# Patient Record
Sex: Male | Born: 1945 | Race: White | Hispanic: No | Marital: Married | Smoking: Former smoker
Health system: Southern US, Community
[De-identification: ages and names within clinical notes are randomized; demographics above are authoritative.]

## PROBLEM LIST (undated history)

## (undated) DIAGNOSIS — E538 Deficiency of other specified B group vitamins: Secondary | ICD-10-CM

## (undated) DIAGNOSIS — K5730 Diverticulosis of large intestine without perforation or abscess without bleeding: Secondary | ICD-10-CM

## (undated) DIAGNOSIS — I482 Chronic atrial fibrillation, unspecified: Secondary | ICD-10-CM

## (undated) DIAGNOSIS — K5150 Left sided colitis without complications: Secondary | ICD-10-CM

## (undated) DIAGNOSIS — I2510 Atherosclerotic heart disease of native coronary artery without angina pectoris: Secondary | ICD-10-CM

## (undated) DIAGNOSIS — I639 Cerebral infarction, unspecified: Secondary | ICD-10-CM

## (undated) DIAGNOSIS — E785 Hyperlipidemia, unspecified: Secondary | ICD-10-CM

## (undated) DIAGNOSIS — I252 Old myocardial infarction: Secondary | ICD-10-CM

## (undated) DIAGNOSIS — E669 Obesity, unspecified: Secondary | ICD-10-CM

## (undated) DIAGNOSIS — E039 Hypothyroidism, unspecified: Secondary | ICD-10-CM

## (undated) DIAGNOSIS — A0472 Enterocolitis due to Clostridium difficile, not specified as recurrent: Secondary | ICD-10-CM

## (undated) DIAGNOSIS — I255 Ischemic cardiomyopathy: Secondary | ICD-10-CM

## (undated) DIAGNOSIS — G4730 Sleep apnea, unspecified: Secondary | ICD-10-CM

## (undated) DIAGNOSIS — I4891 Unspecified atrial fibrillation: Secondary | ICD-10-CM

## (undated) DIAGNOSIS — I10 Essential (primary) hypertension: Secondary | ICD-10-CM

## (undated) DIAGNOSIS — E119 Type 2 diabetes mellitus without complications: Secondary | ICD-10-CM

## (undated) HISTORY — DX: Cerebral infarction, unspecified: I63.9

## (undated) HISTORY — DX: Unspecified atrial fibrillation: I48.91

## (undated) HISTORY — DX: Diverticulosis of large intestine without perforation or abscess without bleeding: K57.30

## (undated) HISTORY — DX: Deficiency of other specified B group vitamins: E53.8

## (undated) HISTORY — DX: Old myocardial infarction: I25.2

## (undated) HISTORY — DX: Chronic atrial fibrillation, unspecified: I48.20

## (undated) HISTORY — DX: Sleep apnea, unspecified: G47.30

## (undated) HISTORY — DX: Enterocolitis due to Clostridium difficile, not specified as recurrent: A04.72

## (undated) HISTORY — DX: Hypothyroidism, unspecified: E03.9

## (undated) HISTORY — PX: CATARACT EXTRACTION: SUR2

## (undated) HISTORY — DX: Atherosclerotic heart disease of native coronary artery without angina pectoris: I25.10

## (undated) HISTORY — PX: TONSILLECTOMY: SUR1361

## (undated) HISTORY — DX: Essential (primary) hypertension: I10

## (undated) HISTORY — DX: Type 2 diabetes mellitus without complications: E11.9

## (undated) HISTORY — DX: Left sided colitis without complications: K51.50

## (undated) HISTORY — DX: Obesity, unspecified: E66.9

## (undated) HISTORY — DX: Ischemic cardiomyopathy: I25.5

## (undated) HISTORY — DX: Hyperlipidemia, unspecified: E78.5

---

## 1998-05-25 ENCOUNTER — Other Ambulatory Visit: Admission: RE | Admit: 1998-05-25 | Discharge: 1998-05-25 | Payer: Self-pay | Admitting: Gastroenterology

## 1999-09-09 ENCOUNTER — Ambulatory Visit: Admission: RE | Admit: 1999-09-09 | Discharge: 1999-09-09 | Payer: Self-pay | Admitting: Internal Medicine

## 2000-09-11 ENCOUNTER — Encounter (INDEPENDENT_AMBULATORY_CARE_PROVIDER_SITE_OTHER): Payer: Self-pay | Admitting: Specialist

## 2000-09-11 ENCOUNTER — Other Ambulatory Visit: Admission: RE | Admit: 2000-09-11 | Discharge: 2000-09-11 | Payer: Self-pay | Admitting: Gastroenterology

## 2002-02-17 ENCOUNTER — Emergency Department (HOSPITAL_COMMUNITY): Admission: EM | Admit: 2002-02-17 | Discharge: 2002-02-17 | Payer: Self-pay | Admitting: Emergency Medicine

## 2002-04-17 ENCOUNTER — Encounter: Admission: RE | Admit: 2002-04-17 | Discharge: 2002-07-16 | Payer: Self-pay | Admitting: Internal Medicine

## 2002-09-09 ENCOUNTER — Encounter: Admission: RE | Admit: 2002-09-09 | Discharge: 2002-12-08 | Payer: Self-pay | Admitting: Internal Medicine

## 2002-10-10 HISTORY — PX: CARDIAC CATHETERIZATION: SHX172

## 2002-11-14 ENCOUNTER — Encounter: Payer: Self-pay | Admitting: Cardiology

## 2002-11-14 ENCOUNTER — Ambulatory Visit (HOSPITAL_COMMUNITY): Admission: RE | Admit: 2002-11-14 | Discharge: 2002-11-15 | Payer: Self-pay | Admitting: Cardiology

## 2002-12-10 ENCOUNTER — Encounter: Admission: RE | Admit: 2002-12-10 | Discharge: 2003-03-10 | Payer: Self-pay | Admitting: Internal Medicine

## 2003-04-08 ENCOUNTER — Encounter: Admission: RE | Admit: 2003-04-08 | Discharge: 2003-07-07 | Payer: Self-pay | Admitting: Internal Medicine

## 2003-05-06 ENCOUNTER — Observation Stay (HOSPITAL_COMMUNITY): Admission: EM | Admit: 2003-05-06 | Discharge: 2003-05-07 | Payer: Self-pay | Admitting: Emergency Medicine

## 2003-05-06 ENCOUNTER — Encounter: Payer: Self-pay | Admitting: Emergency Medicine

## 2003-05-07 ENCOUNTER — Encounter: Payer: Self-pay | Admitting: Internal Medicine

## 2003-07-08 ENCOUNTER — Encounter: Payer: Self-pay | Admitting: Internal Medicine

## 2003-08-06 ENCOUNTER — Inpatient Hospital Stay (HOSPITAL_COMMUNITY): Admission: AD | Admit: 2003-08-06 | Discharge: 2003-08-10 | Payer: Self-pay | Admitting: Internal Medicine

## 2003-08-12 ENCOUNTER — Encounter: Admission: RE | Admit: 2003-08-12 | Discharge: 2003-11-10 | Payer: Self-pay | Admitting: Internal Medicine

## 2003-12-24 ENCOUNTER — Encounter: Admission: RE | Admit: 2003-12-24 | Discharge: 2004-03-23 | Payer: Self-pay | Admitting: Internal Medicine

## 2005-03-01 ENCOUNTER — Ambulatory Visit: Payer: Self-pay | Admitting: Gastroenterology

## 2005-05-31 ENCOUNTER — Ambulatory Visit: Payer: Self-pay | Admitting: Gastroenterology

## 2005-06-15 ENCOUNTER — Encounter (INDEPENDENT_AMBULATORY_CARE_PROVIDER_SITE_OTHER): Payer: Self-pay | Admitting: Specialist

## 2005-06-15 ENCOUNTER — Ambulatory Visit: Payer: Self-pay | Admitting: Gastroenterology

## 2005-06-22 ENCOUNTER — Ambulatory Visit: Payer: Self-pay | Admitting: Internal Medicine

## 2005-07-13 ENCOUNTER — Encounter: Admission: RE | Admit: 2005-07-13 | Discharge: 2005-10-11 | Payer: Self-pay | Admitting: Internal Medicine

## 2005-08-22 ENCOUNTER — Ambulatory Visit: Payer: Self-pay | Admitting: Internal Medicine

## 2005-08-25 ENCOUNTER — Ambulatory Visit: Payer: Self-pay | Admitting: Internal Medicine

## 2005-12-02 ENCOUNTER — Ambulatory Visit: Payer: Self-pay | Admitting: Internal Medicine

## 2005-12-28 ENCOUNTER — Ambulatory Visit: Payer: Self-pay | Admitting: Internal Medicine

## 2006-10-10 HISTORY — PX: CORONARY STENT PLACEMENT: SHX1402

## 2006-11-07 ENCOUNTER — Ambulatory Visit: Payer: Self-pay | Admitting: Cardiology

## 2006-11-07 ENCOUNTER — Inpatient Hospital Stay (HOSPITAL_COMMUNITY): Admission: EM | Admit: 2006-11-07 | Discharge: 2006-11-10 | Payer: Self-pay | Admitting: Cardiovascular Disease

## 2006-11-10 ENCOUNTER — Ambulatory Visit: Payer: Self-pay | Admitting: Internal Medicine

## 2006-11-16 ENCOUNTER — Ambulatory Visit: Payer: Self-pay | Admitting: Internal Medicine

## 2006-11-16 LAB — CONVERTED CEMR LAB
BUN: 5 mg/dL — ABNORMAL LOW (ref 6–23)
CO2: 30 meq/L (ref 19–32)
Calcium: 8.9 mg/dL (ref 8.4–10.5)
Chloride: 106 meq/L (ref 96–112)
Creatinine, Ser: 0.9 mg/dL (ref 0.4–1.5)
GFR calc Af Amer: 111 mL/min
GFR calc non Af Amer: 91 mL/min
Glucose, Bld: 101 mg/dL — ABNORMAL HIGH (ref 70–99)
Potassium: 4.3 meq/L (ref 3.5–5.1)
Sodium: 142 meq/L (ref 135–145)

## 2006-11-30 ENCOUNTER — Ambulatory Visit: Payer: Self-pay | Admitting: Gastroenterology

## 2006-11-30 ENCOUNTER — Encounter: Admission: RE | Admit: 2006-11-30 | Discharge: 2007-02-28 | Payer: Self-pay | Admitting: Internal Medicine

## 2006-11-30 ENCOUNTER — Ambulatory Visit: Payer: Self-pay | Admitting: Cardiology

## 2007-01-12 ENCOUNTER — Ambulatory Visit: Payer: Self-pay | Admitting: Internal Medicine

## 2007-03-08 ENCOUNTER — Ambulatory Visit: Payer: Self-pay | Admitting: Cardiology

## 2007-04-11 ENCOUNTER — Ambulatory Visit: Payer: Self-pay | Admitting: Internal Medicine

## 2007-04-11 LAB — CONVERTED CEMR LAB
ALT: 15 units/L (ref 0–53)
AST: 22 units/L (ref 0–37)
Albumin: 3.9 g/dL (ref 3.5–5.2)
Alkaline Phosphatase: 99 units/L (ref 39–117)
BUN: 13 mg/dL (ref 6–23)
Bilirubin, Direct: 0.1 mg/dL (ref 0.0–0.3)
CO2: 34 meq/L — ABNORMAL HIGH (ref 19–32)
Calcium: 9.1 mg/dL (ref 8.4–10.5)
Chloride: 108 meq/L (ref 96–112)
Cholesterol: 78 mg/dL (ref 0–200)
Creatinine, Ser: 1 mg/dL (ref 0.4–1.5)
GFR calc Af Amer: 98 mL/min
GFR calc non Af Amer: 81 mL/min
Glucose, Bld: 77 mg/dL (ref 70–99)
HDL: 20.7 mg/dL — ABNORMAL LOW (ref >39.0)
LDL Cholesterol: 29 mg/dL (ref 0–99)
Potassium: 4.2 meq/L (ref 3.5–5.1)
Sodium: 145 meq/L (ref 135–145)
TSH: 2.1 microintl units/mL (ref 0.35–5.50)
Total Bilirubin: 0.7 mg/dL (ref 0.3–1.2)
Total CHOL/HDL Ratio: 3.8
Total Protein: 6.9 g/dL (ref 6.0–8.3)
Triglycerides: 140 mg/dL (ref 0–149)
VLDL: 28 mg/dL (ref 0–40)

## 2007-04-12 DIAGNOSIS — E039 Hypothyroidism, unspecified: Secondary | ICD-10-CM

## 2007-04-12 DIAGNOSIS — I10 Essential (primary) hypertension: Secondary | ICD-10-CM

## 2007-04-12 DIAGNOSIS — G4733 Obstructive sleep apnea (adult) (pediatric): Secondary | ICD-10-CM

## 2007-04-12 DIAGNOSIS — G4730 Sleep apnea, unspecified: Secondary | ICD-10-CM

## 2007-04-12 DIAGNOSIS — I252 Old myocardial infarction: Secondary | ICD-10-CM

## 2007-04-12 DIAGNOSIS — I2510 Atherosclerotic heart disease of native coronary artery without angina pectoris: Secondary | ICD-10-CM

## 2007-04-12 HISTORY — DX: Essential (primary) hypertension: I10

## 2007-04-12 HISTORY — DX: Atherosclerotic heart disease of native coronary artery without angina pectoris: I25.10

## 2007-04-12 HISTORY — DX: Old myocardial infarction: I25.2

## 2007-04-12 HISTORY — DX: Obstructive sleep apnea (adult) (pediatric): G47.33

## 2007-04-12 HISTORY — DX: Hypothyroidism, unspecified: E03.9

## 2007-04-12 HISTORY — DX: Sleep apnea, unspecified: G47.30

## 2007-04-16 DIAGNOSIS — E785 Hyperlipidemia, unspecified: Secondary | ICD-10-CM

## 2007-04-16 DIAGNOSIS — E1151 Type 2 diabetes mellitus with diabetic peripheral angiopathy without gangrene: Secondary | ICD-10-CM | POA: Insufficient documentation

## 2007-04-16 DIAGNOSIS — E119 Type 2 diabetes mellitus without complications: Secondary | ICD-10-CM

## 2007-04-16 HISTORY — DX: Hyperlipidemia, unspecified: E78.5

## 2007-04-16 HISTORY — DX: Type 2 diabetes mellitus with diabetic peripheral angiopathy without gangrene: E11.51

## 2007-04-16 HISTORY — DX: Type 2 diabetes mellitus without complications: E11.9

## 2007-04-19 ENCOUNTER — Encounter: Payer: Self-pay | Admitting: Cardiology

## 2007-04-19 ENCOUNTER — Ambulatory Visit: Payer: Self-pay

## 2007-06-20 ENCOUNTER — Ambulatory Visit: Payer: Self-pay | Admitting: Cardiology

## 2007-09-10 ENCOUNTER — Telehealth: Payer: Self-pay | Admitting: Internal Medicine

## 2007-09-14 ENCOUNTER — Ambulatory Visit: Payer: Self-pay | Admitting: Internal Medicine

## 2007-09-17 ENCOUNTER — Telehealth: Payer: Self-pay | Admitting: Internal Medicine

## 2007-09-17 LAB — CONVERTED CEMR LAB
ALT: 25 units/L (ref 0–53)
AST: 25 units/L (ref 0–37)
Albumin: 4.4 g/dL (ref 3.5–5.2)
Alkaline Phosphatase: 94 units/L (ref 39–117)
BUN: 12 mg/dL (ref 6–23)
Bilirubin, Direct: 0.2 mg/dL (ref 0.0–0.3)
CO2: 31 meq/L (ref 19–32)
Calcium: 9.3 mg/dL (ref 8.4–10.5)
Chloride: 103 meq/L (ref 96–112)
Cholesterol: 98 mg/dL (ref 0–200)
Creatinine, Ser: 1 mg/dL (ref 0.4–1.5)
Direct LDL: 35.8 mg/dL
GFR calc Af Amer: 98 mL/min
GFR calc non Af Amer: 81 mL/min
Glucose, Bld: 78 mg/dL (ref 70–99)
HDL: 17.3 mg/dL — ABNORMAL LOW (ref >39.0)
Hgb A1c MFr Bld: 6 % (ref 4.6–6.0)
Potassium: 4.1 meq/L (ref 3.5–5.1)
Sodium: 142 meq/L (ref 135–145)
TSH: 1.65 microintl units/mL (ref 0.35–5.50)
Total Bilirubin: 0.9 mg/dL (ref 0.3–1.2)
Total CHOL/HDL Ratio: 5.7
Total Protein: 6.8 g/dL (ref 6.0–8.3)
Triglycerides: 204 mg/dL (ref 0–149)
VLDL: 41 mg/dL — ABNORMAL HIGH (ref 0–40)

## 2007-10-24 ENCOUNTER — Ambulatory Visit: Payer: Self-pay | Admitting: Cardiology

## 2007-10-30 ENCOUNTER — Ambulatory Visit: Payer: Self-pay | Admitting: Cardiovascular Disease

## 2007-10-30 ENCOUNTER — Ambulatory Visit: Payer: Self-pay

## 2007-11-22 ENCOUNTER — Ambulatory Visit: Payer: Self-pay | Admitting: Cardiovascular Disease

## 2008-02-04 ENCOUNTER — Telehealth: Payer: Self-pay | Admitting: Gastroenterology

## 2008-02-14 ENCOUNTER — Ambulatory Visit (HOSPITAL_BASED_OUTPATIENT_CLINIC_OR_DEPARTMENT_OTHER): Admission: RE | Admit: 2008-02-14 | Discharge: 2008-02-14 | Payer: Self-pay | Admitting: Internal Medicine

## 2008-02-14 ENCOUNTER — Encounter: Payer: Self-pay | Admitting: Internal Medicine

## 2008-02-19 ENCOUNTER — Ambulatory Visit: Payer: Self-pay | Admitting: Gastroenterology

## 2008-02-19 ENCOUNTER — Ambulatory Visit: Payer: Self-pay | Admitting: Pulmonary Disease

## 2008-02-19 ENCOUNTER — Ambulatory Visit: Payer: Self-pay | Admitting: Cardiovascular Disease

## 2008-02-20 ENCOUNTER — Ambulatory Visit: Payer: Self-pay | Admitting: Internal Medicine

## 2008-04-02 ENCOUNTER — Ambulatory Visit: Payer: Self-pay | Admitting: Internal Medicine

## 2008-04-03 LAB — CONVERTED CEMR LAB
ALT: 24 units/L (ref 0–53)
AST: 22 units/L (ref 0–37)
BUN: 14 mg/dL (ref 6–23)
CO2: 32 meq/L (ref 19–32)
Calcium: 9.2 mg/dL (ref 8.4–10.5)
Chloride: 101 meq/L (ref 96–112)
Cholesterol: 95 mg/dL (ref 0–200)
Creatinine, Ser: 1 mg/dL (ref 0.4–1.5)
GFR calc Af Amer: 97 mL/min
GFR calc non Af Amer: 80 mL/min
Glucose, Bld: 150 mg/dL — ABNORMAL HIGH (ref 70–99)
HDL: 24 mg/dL — ABNORMAL LOW (ref >39.0)
Hgb A1c MFr Bld: 6.6 % — ABNORMAL HIGH (ref 4.6–6.0)
LDL Cholesterol: 36 mg/dL (ref 0–99)
Potassium: 4.2 meq/L (ref 3.5–5.1)
Sodium: 142 meq/L (ref 135–145)
TSH: 1.77 microintl units/mL (ref 0.35–5.50)
Total CHOL/HDL Ratio: 4
Triglycerides: 177 mg/dL — ABNORMAL HIGH (ref 0–149)
VLDL: 35 mg/dL (ref 0–40)

## 2008-06-17 ENCOUNTER — Ambulatory Visit: Payer: Self-pay | Admitting: Cardiovascular Disease

## 2008-06-17 ENCOUNTER — Encounter: Payer: Self-pay | Admitting: Internal Medicine

## 2008-07-14 ENCOUNTER — Ambulatory Visit: Payer: Self-pay | Admitting: Internal Medicine

## 2008-07-15 ENCOUNTER — Ambulatory Visit: Payer: Self-pay | Admitting: Gastroenterology

## 2008-07-15 LAB — CONVERTED CEMR LAB
ALT: 33 units/L (ref 0–53)
AST: 30 units/L (ref 0–37)
BUN: 13 mg/dL (ref 6–23)
CO2: 35 meq/L — ABNORMAL HIGH (ref 19–32)
Calcium: 8.7 mg/dL (ref 8.4–10.5)
Chloride: 101 meq/L (ref 96–112)
Cholesterol: 104 mg/dL (ref 0–200)
Creatinine, Ser: 1 mg/dL (ref 0.4–1.5)
Direct LDL: 30.1 mg/dL
GFR calc Af Amer: 97 mL/min
GFR calc non Af Amer: 80 mL/min
Glucose, Bld: 118 mg/dL — ABNORMAL HIGH (ref 70–99)
HDL: 23.7 mg/dL — ABNORMAL LOW (ref >39.0)
Hgb A1c MFr Bld: 6.7 % — ABNORMAL HIGH (ref 4.6–6.0)
Potassium: 3.9 meq/L (ref 3.5–5.1)
Sodium: 144 meq/L (ref 135–145)
Total CHOL/HDL Ratio: 4.4
Triglycerides: 290 mg/dL (ref 0–149)
VLDL: 58 mg/dL — ABNORMAL HIGH (ref 0–40)

## 2008-07-18 ENCOUNTER — Telehealth: Payer: Self-pay | Admitting: Internal Medicine

## 2008-08-11 ENCOUNTER — Ambulatory Visit: Payer: Self-pay | Admitting: Gastroenterology

## 2008-08-11 ENCOUNTER — Encounter: Payer: Self-pay | Admitting: Gastroenterology

## 2008-08-12 ENCOUNTER — Encounter: Payer: Self-pay | Admitting: Gastroenterology

## 2008-10-20 ENCOUNTER — Ambulatory Visit: Payer: Self-pay | Admitting: Cardiovascular Disease

## 2008-10-24 ENCOUNTER — Ambulatory Visit: Payer: Self-pay | Admitting: Cardiology

## 2008-11-25 ENCOUNTER — Encounter: Payer: Self-pay | Admitting: Internal Medicine

## 2009-02-02 ENCOUNTER — Ambulatory Visit: Payer: Self-pay | Admitting: Internal Medicine

## 2009-02-03 LAB — CONVERTED CEMR LAB
ALT: 30 units/L (ref 0–53)
AST: 21 units/L (ref 0–37)
Albumin: 4.2 g/dL (ref 3.5–5.2)
Alkaline Phosphatase: 93 units/L (ref 39–117)
BUN: 12 mg/dL (ref 6–23)
Bilirubin, Direct: 0 mg/dL (ref 0.0–0.3)
CO2: 34 meq/L — ABNORMAL HIGH (ref 19–32)
Calcium: 9 mg/dL (ref 8.4–10.5)
Chloride: 105 meq/L (ref 96–112)
Cholesterol: 110 mg/dL (ref 0–200)
Creatinine, Ser: 0.9 mg/dL (ref 0.4–1.5)
Direct LDL: 36.9 mg/dL
GFR calc non Af Amer: 90.57 mL/min (ref >60)
Glucose, Bld: 94 mg/dL (ref 70–99)
HDL: 24.9 mg/dL — ABNORMAL LOW (ref >39.00)
Hgb A1c MFr Bld: 6.7 % — ABNORMAL HIGH (ref 4.6–6.5)
Potassium: 3.9 meq/L (ref 3.5–5.1)
Sodium: 146 meq/L — ABNORMAL HIGH (ref 135–145)
TSH: 1.35 microintl units/mL (ref 0.35–5.50)
Total Bilirubin: 1 mg/dL (ref 0.3–1.2)
Total CHOL/HDL Ratio: 4
Total Protein: 7.1 g/dL (ref 6.0–8.3)
Triglycerides: 249 mg/dL — ABNORMAL HIGH (ref 0.0–149.0)
VLDL: 49.8 mg/dL — ABNORMAL HIGH (ref 0.0–40.0)

## 2009-03-06 ENCOUNTER — Ambulatory Visit: Payer: Self-pay | Admitting: Internal Medicine

## 2009-04-14 ENCOUNTER — Ambulatory Visit: Payer: Self-pay | Admitting: Cardiovascular Disease

## 2009-04-14 ENCOUNTER — Encounter: Payer: Self-pay | Admitting: Cardiology

## 2009-05-19 ENCOUNTER — Encounter: Payer: Self-pay | Admitting: Internal Medicine

## 2009-06-01 ENCOUNTER — Ambulatory Visit: Payer: Self-pay | Admitting: Internal Medicine

## 2009-06-01 LAB — CONVERTED CEMR LAB
ALT: 30 units/L (ref 0–53)
AST: 23 units/L (ref 0–37)
Albumin: 4 g/dL (ref 3.5–5.2)
Alkaline Phosphatase: 92 units/L (ref 39–117)
BUN: 15 mg/dL (ref 6–23)
Bilirubin, Direct: 0 mg/dL (ref 0.0–0.3)
CO2: 32 meq/L (ref 19–32)
Calcium: 9.1 mg/dL (ref 8.4–10.5)
Chloride: 105 meq/L (ref 96–112)
Cholesterol: 106 mg/dL (ref 0–200)
Creatinine, Ser: 1 mg/dL (ref 0.4–1.5)
Direct LDL: 34.3 mg/dL
GFR calc non Af Amer: 80.12 mL/min (ref >60)
Glucose, Bld: 151 mg/dL — ABNORMAL HIGH (ref 70–99)
HDL: 22.4 mg/dL — ABNORMAL LOW (ref >39.00)
Hgb A1c MFr Bld: 7 % — ABNORMAL HIGH (ref 4.6–6.5)
Potassium: 4.6 meq/L (ref 3.5–5.1)
Sodium: 141 meq/L (ref 135–145)
Total Bilirubin: 0.8 mg/dL (ref 0.3–1.2)
Total CHOL/HDL Ratio: 5
Total Protein: 6.9 g/dL (ref 6.0–8.3)
Triglycerides: 264 mg/dL — ABNORMAL HIGH (ref 0.0–149.0)
VLDL: 52.8 mg/dL — ABNORMAL HIGH (ref 0.0–40.0)

## 2009-06-16 ENCOUNTER — Ambulatory Visit: Payer: Self-pay | Admitting: Internal Medicine

## 2009-06-16 DIAGNOSIS — E669 Obesity, unspecified: Secondary | ICD-10-CM

## 2009-06-16 HISTORY — DX: Obesity, unspecified: E66.9

## 2009-07-17 ENCOUNTER — Encounter: Payer: Self-pay | Admitting: Cardiology

## 2009-08-03 ENCOUNTER — Ambulatory Visit: Payer: Self-pay | Admitting: Internal Medicine

## 2009-08-20 ENCOUNTER — Encounter (INDEPENDENT_AMBULATORY_CARE_PROVIDER_SITE_OTHER): Payer: Self-pay | Admitting: *Deleted

## 2009-08-21 ENCOUNTER — Encounter (INDEPENDENT_AMBULATORY_CARE_PROVIDER_SITE_OTHER): Payer: Self-pay | Admitting: *Deleted

## 2009-10-14 ENCOUNTER — Encounter: Payer: Self-pay | Admitting: Internal Medicine

## 2009-10-14 ENCOUNTER — Telehealth: Payer: Self-pay | Admitting: Gastroenterology

## 2009-10-14 ENCOUNTER — Encounter: Payer: Self-pay | Admitting: Cardiology

## 2009-10-14 ENCOUNTER — Ambulatory Visit: Payer: Self-pay | Admitting: Cardiovascular Disease

## 2009-10-15 ENCOUNTER — Ambulatory Visit: Payer: Self-pay | Admitting: Internal Medicine

## 2009-10-15 LAB — CONVERTED CEMR LAB
ALT: 21 units/L (ref 0–53)
AST: 22 units/L (ref 0–37)
BUN: 10 mg/dL (ref 6–23)
Bilirubin, Direct: 0 mg/dL (ref 0.0–0.3)
Calcium: 8.9 mg/dL (ref 8.4–10.5)
Cholesterol: 82 mg/dL (ref 0–200)
GFR calc non Af Amer: 80.02 mL/min (ref >60)
Glucose, Bld: 163 mg/dL — ABNORMAL HIGH (ref 70–99)
HDL: 23.8 mg/dL — ABNORMAL LOW (ref >39.00)
Hgb A1c MFr Bld: 7 % — ABNORMAL HIGH (ref 4.6–6.5)
Sodium: 139 meq/L (ref 135–145)
Total Bilirubin: 0.9 mg/dL (ref 0.3–1.2)
Total Protein: 6.7 g/dL (ref 6.0–8.3)

## 2009-10-23 ENCOUNTER — Ambulatory Visit: Payer: Self-pay | Admitting: Internal Medicine

## 2009-10-29 ENCOUNTER — Ambulatory Visit: Payer: Self-pay | Admitting: Gastroenterology

## 2009-11-23 ENCOUNTER — Ambulatory Visit: Payer: Self-pay | Admitting: Cardiology

## 2009-12-02 ENCOUNTER — Encounter: Payer: Self-pay | Admitting: Cardiology

## 2009-12-08 ENCOUNTER — Ambulatory Visit: Payer: Self-pay | Admitting: Internal Medicine

## 2009-12-28 ENCOUNTER — Encounter (HOSPITAL_COMMUNITY): Admission: RE | Admit: 2009-12-28 | Discharge: 2010-03-28 | Payer: Self-pay | Admitting: Cardiology

## 2010-01-20 ENCOUNTER — Ambulatory Visit: Payer: Self-pay | Admitting: Internal Medicine

## 2010-01-20 LAB — CONVERTED CEMR LAB
ALT: 22 units/L (ref 0–53)
AST: 22 units/L (ref 0–37)
Alkaline Phosphatase: 101 units/L (ref 39–117)
Bilirubin, Direct: 0.2 mg/dL (ref 0.0–0.3)
CO2: 35 meq/L — ABNORMAL HIGH (ref 19–32)
Chloride: 100 meq/L (ref 96–112)
Potassium: 3.8 meq/L (ref 3.5–5.1)
Sodium: 145 meq/L (ref 135–145)
Total CHOL/HDL Ratio: 3
Total Protein: 7.1 g/dL (ref 6.0–8.3)

## 2010-01-28 ENCOUNTER — Ambulatory Visit: Payer: Self-pay | Admitting: Internal Medicine

## 2010-02-03 ENCOUNTER — Encounter: Payer: Self-pay | Admitting: Cardiology

## 2010-03-30 ENCOUNTER — Telehealth: Payer: Self-pay | Admitting: Internal Medicine

## 2010-04-09 ENCOUNTER — Encounter (HOSPITAL_COMMUNITY): Admission: RE | Admit: 2010-04-09 | Discharge: 2010-07-08 | Payer: Self-pay | Admitting: Cardiology

## 2010-04-15 ENCOUNTER — Ambulatory Visit: Payer: Self-pay | Admitting: Cardiovascular Disease

## 2010-06-01 ENCOUNTER — Ambulatory Visit: Payer: Self-pay | Admitting: Internal Medicine

## 2010-06-01 LAB — CONVERTED CEMR LAB
Albumin: 4.2 g/dL (ref 3.5–5.2)
Bilirubin, Direct: 0.2 mg/dL (ref 0.0–0.3)
Chloride: 103 meq/L (ref 96–112)
Creatinine, Ser: 0.9 mg/dL (ref 0.4–1.5)
Hgb A1c MFr Bld: 7.1 % — ABNORMAL HIGH (ref 4.6–6.5)
LDL Cholesterol: 25 mg/dL (ref 0–99)
Potassium: 4.7 meq/L (ref 3.5–5.1)
Sodium: 143 meq/L (ref 135–145)
Total Bilirubin: 0.7 mg/dL (ref 0.3–1.2)
Total CHOL/HDL Ratio: 4
Total Protein: 6.3 g/dL (ref 6.0–8.3)
Triglycerides: 194 mg/dL — ABNORMAL HIGH (ref 0.0–149.0)

## 2010-06-08 ENCOUNTER — Ambulatory Visit: Payer: Self-pay | Admitting: Internal Medicine

## 2010-06-16 ENCOUNTER — Ambulatory Visit: Payer: Self-pay

## 2010-06-16 ENCOUNTER — Ambulatory Visit: Payer: Self-pay | Admitting: Cardiovascular Disease

## 2010-06-23 ENCOUNTER — Encounter: Payer: Self-pay | Admitting: Cardiology

## 2010-06-23 ENCOUNTER — Ambulatory Visit: Payer: Self-pay | Admitting: Cardiovascular Disease

## 2010-07-10 ENCOUNTER — Encounter (HOSPITAL_COMMUNITY)
Admission: RE | Admit: 2010-07-10 | Discharge: 2010-10-08 | Payer: Self-pay | Source: Home / Self Care | Attending: Cardiology | Admitting: Cardiology

## 2010-07-23 ENCOUNTER — Telehealth: Payer: Self-pay | Admitting: Internal Medicine

## 2010-08-04 ENCOUNTER — Encounter: Payer: Self-pay | Admitting: Cardiology

## 2010-08-06 ENCOUNTER — Ambulatory Visit: Payer: Self-pay | Admitting: Cardiology

## 2010-08-06 ENCOUNTER — Encounter: Payer: Self-pay | Admitting: Internal Medicine

## 2010-08-06 ENCOUNTER — Encounter: Payer: Self-pay | Admitting: Cardiology

## 2010-08-24 ENCOUNTER — Ambulatory Visit: Payer: Self-pay | Admitting: Internal Medicine

## 2010-08-26 ENCOUNTER — Ambulatory Visit: Payer: Self-pay

## 2010-08-26 ENCOUNTER — Ambulatory Visit: Payer: Self-pay | Admitting: Cardiology

## 2010-08-26 ENCOUNTER — Encounter: Payer: Self-pay | Admitting: Cardiology

## 2010-08-26 ENCOUNTER — Encounter (INDEPENDENT_AMBULATORY_CARE_PROVIDER_SITE_OTHER): Payer: Self-pay | Admitting: *Deleted

## 2010-08-26 ENCOUNTER — Ambulatory Visit (HOSPITAL_COMMUNITY): Admission: RE | Admit: 2010-08-26 | Discharge: 2010-08-26 | Payer: Self-pay | Admitting: Cardiology

## 2010-09-06 ENCOUNTER — Encounter: Payer: Self-pay | Admitting: Cardiovascular Disease

## 2010-09-06 ENCOUNTER — Encounter: Payer: Self-pay | Admitting: Cardiology

## 2010-09-15 ENCOUNTER — Encounter: Payer: Self-pay | Admitting: Internal Medicine

## 2010-09-16 ENCOUNTER — Ambulatory Visit: Payer: Self-pay | Admitting: Internal Medicine

## 2010-09-16 LAB — CONVERTED CEMR LAB
Albumin: 4.2 g/dL (ref 3.5–5.2)
CO2: 31 meq/L (ref 19–32)
Chloride: 98 meq/L (ref 96–112)
HDL: 18.3 mg/dL — ABNORMAL LOW (ref >39.00)
LDL Cholesterol: 22 mg/dL (ref 0–99)
Sodium: 137 meq/L (ref 135–145)
Total CHOL/HDL Ratio: 4
Triglycerides: 156 mg/dL — ABNORMAL HIGH (ref 0.0–149.0)

## 2010-09-30 ENCOUNTER — Ambulatory Visit: Payer: Self-pay | Admitting: Internal Medicine

## 2010-10-01 ENCOUNTER — Ambulatory Visit: Payer: Self-pay | Admitting: Internal Medicine

## 2010-10-08 ENCOUNTER — Telehealth (INDEPENDENT_AMBULATORY_CARE_PROVIDER_SITE_OTHER): Payer: Self-pay | Admitting: *Deleted

## 2010-10-10 HISTORY — PX: COLONOSCOPY: SHX174

## 2010-10-12 ENCOUNTER — Encounter (HOSPITAL_COMMUNITY)
Admission: RE | Admit: 2010-10-12 | Discharge: 2010-11-09 | Payer: Self-pay | Source: Home / Self Care | Attending: Cardiovascular Disease | Admitting: Cardiovascular Disease

## 2010-10-27 ENCOUNTER — Telehealth: Payer: Self-pay | Admitting: Gastroenterology

## 2010-11-09 NOTE — Assessment & Plan Note (Signed)
Summary: MEDICATION REFILL--CH.    History of Present Illness Visit Type: follow up  Primary GI MD: Verl Blalock MD Primary Provider: Phoebe Sharps, MD Requesting Provider: n/a Chief Complaint: F/u for IBS. Pt denies any GI complaints. Pt needs Apriso refilled History of Present Illness:   Freeman denies any GI complaints. 11 regular bowel movements without abdominal pain, diarrhea, rectal bleeding. He takes Apriso 4 tablets a day and daily folic acid. 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 He is on the care of Dr. Phoebe Sharps and primary care. He has type 2 diabetes, obesity, and hyperlipidemia. He does not smoke or abuse ethanol, but has been unable using weight.   GI Review of Systems      Denies abdominal pain, acid reflux, belching, bloating, chest pain, dysphagia with liquids, dysphagia with solids, heartburn, loss of appetite, nausea, vomiting, vomiting blood, weight loss, and  weight gain.        Denies anal fissure, black tarry stools, change in bowel habit, constipation, diarrhea, diverticulosis, fecal incontinence, heme positive stool, hemorrhoids, irritable bowel syndrome, jaundice, light color stool, liver problems, rectal bleeding, and  rectal pain.    Current Medications (verified): 1)  Tra2p Study Drug .... Take One Tab Once Daily 2)  Simvastatin 40 Mg Tabs (Simvastatin) .... Take 1 Tablet By Mouth At Bedtime 3)  Apriso 0.375 Gm  Cp24 (Mesalamine) .... Take Four Tabs in Am Daily 4)  Benicar 40 Mg Tabs (Olmesartan Medoxomil) .... Take 1 Tablet By Mouth Once A Day 5)  Carvedilol 25 Mg  Tabs (Carvedilol) .Marland Kitchen.. 1 By Mouth Two Times A Day 6)  Folic Acid 1 Mg Tabs (Folic Acid) .... Take 1 Tablet By Mouth Once A Day 7)  Furosemide 40 Mg Tabs (Furosemide) .... Take 1 Tablet By Mouth Once A Day 8)  Glyburide 2.5 Mg Tabs (Glyburide) .... Take 1 Tablet By Mouth Once A Day 9)  Metformin Hcl 500 Mg Tabs (Metformin Hcl) .... Take 1 Tablet By Mouth  Once A Day 10)  Plavix 75 Mg Tabs (Clopidogrel Bisulfate) .... Take 1 Tablet By Mouth Once A Day 11)  Synthroid 50 Mcg Tabs (Levothyroxine Sodium) .... Take 1 Tablet By Mouth Once A Day 12)  Potassium Chloride Crys Cr 20 Meq  Tbcr (Potassium Chloride Crys Cr) .... Take 1 Tablet By Mouth Once A Day 13)  Aspirin Ec 325 Mg  Tbec (Aspirin) .... Once Daily 14)  B-12 250 Mcg  Tabs (Cyanocobalamin) .... Once Daily 15)  Triamcinolone Acetonide 0.5 % Crea (Triamcinolone Acetonide) .... Apply Bid To Affected Area 16)  Osteo Bi-Flex Regular Strength 250-200 Mg Tabs (Glucosamine-Chondroitin) .... As Directed 17)  Amlodipine Besylate 10 Mg Tabs (Amlodipine Besylate) .... Take 1 Tablet By Mouth Once A Day  Allergies (verified): No Known Drug Allergies  Past History:  Past medical, surgical, family and social histories (including risk factors) reviewed for relevance to current acute and chronic problems.  Past Medical History: Reviewed history from 04/12/2007 and no changes required. Coronary artery disease  01/04 Hypertension Hypothyroidism Myocardial infarction, hx of, 2008   ICM cath 35-40% inflammatory bowel OSA dyslipidemia increased glucose Diabetes mellitus, type II Hyperlipidemia  Past Surgical History: Reviewed history from 04/12/2007 and no changes required. stent 01/04 stent LAD 2008  Family History: Reviewed history from 02/19/2008 and no changes required. Family History of Stomach Cancer:aunt and grandmother paternal  Social History: Reviewed history from 07/09/2009 and no changes required. Married Regular exercise-no Occupation:--retired.  Patient is  a former smoker.  Alcohol Use - no Daily Caffeine Use His granddaughter ms Ovid Curd is an Therapist, sports on 2000 at [place]  The patient denies allergy/sinus, anemia, anxiety-new, arthritis/joint pain, back pain, blood in urine, breast changes/lumps, change in vision, confusion, cough, coughing up blood,  depression-new, fainting, fatigue, fever, headaches-new, hearing problems, heart murmur, heart rhythm changes, itching, muscle pains/cramps, night sweats, nosebleeds, shortness of breath, skin rash, sleeping problems, sore throat, swelling of feet/legs, swollen lymph glands, thirst - excessive, urination - excessive, urination changes/pain, urine leakage, vision changes, and voice change.    Vital Signs:  Patient profile:   65 year old male Height:      70 inches Weight:      314 pounds BMI:     45.22 BSA:     2.53 Pulse rate:   60 / minute Pulse rhythm:   regular BP sitting:   142 / 84  (left arm) Cuff size:   regular  Vitals Entered By: Hope Pigeon CMA (October 29, 2009 8:40 AM)  Physical Exam  General:  Well developed, well nourished, no acute distress.obese.   Head:  Normocephalic and atraumatic. Eyes:  PERRLA, no icterus.exam deferred to patient's ophthalmologist.   Abdomen:  Soft, nontender and nondistended. No masses, hepatosplenomegaly or hernias noted. Normal bowel sounds.Extremely obese patient making abdominal exam very difficult. Psych:  Alert and cooperative. Normal mood and affect.   Impression & Recommendations:  Problem # 1:  OBESITY (ICD-278.00) Assessment Deteriorated Dietary therapy again urged along with Dr. Leanne Chang recommendations. He appears to be under good diabetic control with oral medications.  Problem # 2:  SLEEP APNEA (ICD-780.57) Assessment: Unchanged  Problem # 3:  DIABETES MELLITUS, TYPE II (ICD-250.00) Assessment: Improved continue current meds  Problem # 4:  INFLAMMATORY BOWEL DISEASE (ICD-558.9) Assessment: Improved He has altered colitis in remission on standard aminosalicylate and folic acid doses. We will continue all of these medications with yearly followup or p.r.n. as needed. He is in the recall system for dysplasia screening colonoscopy every 2-3 years.  Patient Instructions: 1)  Copy sent to : Dr. Phoebe Sharps 2)  Please continue  current medications.  3)  Please schedule a follow-up appointment as needed.  4)  The medication list was reviewed and reconciled.  All changed / newly prescribed medications were explained.  A complete medication list was provided to the patient / caregiver.

## 2010-11-09 NOTE — Assessment & Plan Note (Signed)
Summary: flu shot/cdw   Nurse Visit   Allergies: No Known Drug Allergies  Orders Added: 1)  Admin 1st Vaccine [90471] 2)  Flu Vaccine 58yr + [[57505]Flu Vaccine Consent Questions     Do you have a history of severe allergic reactions to this vaccine? no    Any prior history of allergic reactions to egg and/or gelatin? no    Do you have a sensitivity to the preservative Thimersol? no    Do you have a past history of Guillan-Barre Syndrome? no    Do you currently have an acute febrile illness? no    Have you ever had a severe reaction to latex? no    Vaccine information given and explained to patient? yes    Are you currently pregnant? no    Lot Number:[id number]   Exp Date:04/09/2011   Site Given  Left Deltoid IM-CCC]  .[email or web address]

## 2010-11-09 NOTE — Assessment & Plan Note (Signed)
Summary: rov/sl  Medications Added POTASSIUM CHLORIDE CRYS CR 20 MEQ  TBCR (POTASSIUM CHLORIDE CRYS CR) Take 1 tablet by mouth once a day      Allergies Added: NKDA  Visit Type:  Follow-up Referring Provider:  n/a Primary Provider:  Phoebe Sharps, MD  CC:  no cardaic complaints.  History of Present Illness: The patient is 65 years old and return for followup management of CAD. In 2004 he had a non-ST elevation MI and was treated with a Cypher stent to the LAD. In January of 2008 he had stent thrombosis with an anterior infarction and was reperfused early. His LV function recovered to an ejection fraction of 55%.  He has done well since that time his had no recurrent chest pain shortness of breath or palpitations. He has had a problem with his weight and his weight which was once 165 pounds and up to 310 pounds. He saw Dr. Leanne Chang about a month ago and they're working on weight loss programs. He is seeing a nutritionist and heat and who is working with exercise and weight reduction. His daughter Sharyn Lull who is an Therapist, sports at [employer] [facility] [employer] on 2000 suggested that he might get involved in the cardiac rehabilitation program a [place].  Current Medications (verified): 1)  Tra2p Study Drug .... Take One Tab Once Daily 2)  Simvastatin 40 Mg Tabs (Simvastatin) .... Take 1 Tablet By Mouth At Bedtime 3)  Apriso 0.375 Gm  Cp24 (Mesalamine) .... Take Four Tabs in Am Daily 4)  Benicar 40 Mg Tabs (Olmesartan Medoxomil) .... Take 1 Tablet By Mouth Once A Day 5)  Carvedilol 25 Mg  Tabs (Carvedilol) .Marland Kitchen.. 1 By Mouth Two Times A Day 6)  Folic Acid 1 Mg Tabs (Folic Acid) .... Take 1 Tablet By Mouth Once A Day 7)  Furosemide 40 Mg Tabs (Furosemide) .... Take 1 Tablet By Mouth Once A Day 8)  Glyburide 2.5 Mg Tabs (Glyburide) .... Take 1 Tablet By Mouth Once A Day 9)  Metformin Hcl 500 Mg Tabs (Metformin Hcl) .... Take 1 Tablet By Mouth Once A Day 10)  Plavix 75 Mg Tabs (Clopidogrel Bisulfate) .... Take 1  Tablet By Mouth Once A Day 11)  Synthroid 50 Mcg Tabs (Levothyroxine Sodium) .... Take 1 Tablet By Mouth Once A Day 12)  Potassium Chloride Crys Cr 20 Meq  Tbcr (Potassium Chloride Crys Cr) .... Take 1 Tablet By Mouth Once A Day 13)  Aspirin Ec 325 Mg  Tbec (Aspirin) .... Once Daily 14)  B-12 250 Mcg  Tabs (Cyanocobalamin) .... Once Daily 15)  Triamcinolone Acetonide 0.5 % Crea (Triamcinolone Acetonide) .... Apply Bid To Affected Area 16)  Osteo Bi-Flex Regular Strength 250-200 Mg Tabs (Glucosamine-Chondroitin) .... As Directed 17)  Amlodipine Besylate 10 Mg Tabs (Amlodipine Besylate) .... Take 1 Tablet By Mouth Once A Day  Allergies (verified): No Known Drug Allergies  Past History:  Past Medical History: Reviewed history from 04/12/2007 and no changes required. Coronary artery disease  01/04 Hypertension Hypothyroidism Myocardial infarction, hx of, 2008   ICM cath 35-40% inflammatory bowel OSA dyslipidemia increased glucose Diabetes mellitus, type II Hyperlipidemia  Review of Systems       ROS is negative except as outlined in HPI.   Vital Signs:  Patient profile:   65 year old male Height:      70 inches Weight:      308 pounds BMI:     44.35 Pulse rate:   67 / minute BP sitting:  144 / 75  (left arm) Cuff size:   large  Vitals Entered By: Lubertha Basque, CNA (November 23, 2009 12:16 PM)  Physical Exam  Additional Exam:  Gen. Well-nourished, in no distress   Neck: No JVD, thyroid not enlarged, no carotid bruits Lungs: No tachypnea, clear without rales, rhonchi or wheezes Cardiovascular: Rhythm regular, PMI not displaced,  heart sounds  normal, no murmurs or gallops, no peripheral edema, pulses normal in all 4 extremities. Abdomen: BS normal, abdomen soft and non-tender without masses or organomegaly, no hepatosplenomegaly. MS: No deformities, no cyanosis or clubbing   Neuro:  No focal sns   Skin:  no lesions    Impression & Recommendations:  Problem # 1:   CORONARY ARTERY DISEASE (ICD-414.00) He has had a previous anterior MI due to stent thrombosis but has had full recovery his LV function. He is doing well without chest pain shortness breath or palpitations. We will continue his current therapy. His updated medication list for this problem includes:    Carvedilol 25 Mg Tabs (Carvedilol) .Marland Kitchen... 1 by mouth two times a day    Plavix 75 Mg Tabs (Clopidogrel bisulfate) .Marland Kitchen... Take 1 tablet by mouth once a day    Aspirin Ec 325 Mg Tbec (Aspirin) ..... Once daily    Amlodipine Besylate 10 Mg Tabs (Amlodipine besylate) .Marland Kitchen... Take 1 tablet by mouth once a day  Orders: EKG w/ Interpretation (93000) Cardiac Rehabilitation (Cardiac Rehab)  Problem # 2:  HYPERLIPIDEMIA (ICD-272.4) This was recently checked by Dr. Leanne Chang and is doing well on current therapy. His updated medication list for this problem includes:    Simvastatin 40 Mg Tabs (Simvastatin) .Marland Kitchen... Take 1 tablet by mouth at bedtime  Problem # 3:  HYPERTENSION (ICD-401.9) His blood pressure is well controlled on current medications.   His updated medication list for this problem includes:    Benicar 40 Mg Tabs (Olmesartan medoxomil) .Marland Kitchen... Take 1 tablet by mouth once a day    Carvedilol 25 Mg Tabs (Carvedilol) .Marland Kitchen... 1 by mouth two times a day    Furosemide 40 Mg Tabs (Furosemide) .Marland Kitchen... Take 1 tablet by mouth once a day    Aspirin Ec 325 Mg Tbec (Aspirin) ..... Once daily    Amlodipine Besylate 10 Mg Tabs (Amlodipine besylate) .Marland Kitchen... Take 1 tablet by mouth once a day  Problem # 4:  OBESITY (ICD-278.00) His weight is his biggest problem. He is interested in getting in the rehabilitation program at [facility] we will try to facilitate that. I told him it is likely that his insurance will not cover that.  Patient Instructions: 1)  Your physician recommends that you schedule a follow-up appointment in: November 2011 with Dr Waunita Schooner 2)  Your physician recommends that you continue on your current  medications as directed. Please refer to the Current Medication list given to you today. 3)  You will be called by Cardiac Rehab to be set up.

## 2010-11-09 NOTE — Assessment & Plan Note (Signed)
Summary: 4 month rov/njr   Vital Signs:  Patient profile:   65 year old male Weight:      307 pounds Temp:     98.2 degrees F oral Pulse rate:   60 / minute Pulse rhythm:   regular Resp:     12 per minute BP sitting:   158 / 82  (left arm) Cuff size:   large  Vitals Entered By: Rica Records, RN (June 08, 2010 10:41 AM) CC: 4 month rov, labs done- Is Patient Diabetic? Yes Did you bring your meter with you today? No Comments has spot on right calf he wants checked   Primary Care Provider:  Phoebe Sharps, MD  CC:  4 month rov and labs done-.  History of Present Illness:  Follow-Up Visit      This is a 65 year old man who presents for Follow-up visit.  The patient denies chest pain and palpitations.  Since the last visit the patient notes no new problems or concerns.  The patient reports taking meds as prescribed.  When questioned about possible medication side effects, the patient notes none.    All other systems reviewed and were negative   Preventive Screening-Counseling & Management  Alcohol-Tobacco     Smoking Status: quit  Current Problems (verified): 1)  Obesity  (ICD-278.00) 2)  Inflammatory Bowel Disease  (ICD-558.9) 3)  Sleep Apnea  (ICD-780.57) 4)  Hyperlipidemia  (ICD-272.4) 5)  Diabetes Mellitus, Type II  (ICD-250.00) 6)  Myocardial Infarction, Hx of  (ICD-412) 7)  Hypothyroidism  (ICD-244.9) 8)  Hypertension  (ICD-401.9) 9)  Coronary Artery Disease  (ICD-414.00)  Current Medications (verified): 1)  Tra2p Study Drug .... Take One Tab Once Daily 2)  Simvastatin 40 Mg Tabs (Simvastatin) .... Take 1 Tablet By Mouth At Bedtime 3)  Apriso 0.375 Gm  Cp24 (Mesalamine) .... Take Four Tabs in Am Daily 4)  Benicar 40 Mg Tabs (Olmesartan Medoxomil) .... Take 1 Tablet By Mouth Once A Day 5)  Carvedilol 25 Mg Tabs (Carvedilol) .... One Tablet Every Morning 6)  Folic Acid 1 Mg Tabs (Folic Acid) .... Take 1 Tablet By Mouth Once A Day 7)  Furosemide 40 Mg Tabs  (Furosemide) .... Take 1 Tablet By Mouth Once A Day 8)  Glyburide 2.5 Mg Tabs (Glyburide) .... Take 1 Tablet By Mouth Once A Day 9)  Metformin Hcl 500 Mg Tabs (Metformin Hcl) .... Take 1 Tablet By Mouth Once A Day 10)  Plavix 75 Mg Tabs (Clopidogrel Bisulfate) .... Take 1 Tablet By Mouth Once A Day 11)  Synthroid 50 Mcg Tabs (Levothyroxine Sodium) .... Take 1 Tablet By Mouth Once A Day 12)  Potassium Chloride Crys Cr 20 Meq  Tbcr (Potassium Chloride Crys Cr) .... Take 1 Tablet By Mouth Once A Day 13)  Aspirin Ec 325 Mg  Tbec (Aspirin) .... Once Daily 14)  B-12 250 Mcg  Tabs (Cyanocobalamin) .... Once Daily 15)  Triamcinolone Acetonide 0.5 % Crea (Triamcinolone Acetonide) .... Apply Bid To Affected Area 16)  Osteo Bi-Flex Regular Strength 250-200 Mg Tabs (Glucosamine-Chondroitin) .... As Directed 17)  Amlodipine Besylate 10 Mg Tabs (Amlodipine Besylate) .... Take 1 Tablet By Mouth Once A Day 18)  Carvedilol 12.5 Mg Tabs (Carvedilol) .... One Tablet Every Afternoon 19)  Claritin-D 24 Hour 10-240 Mg Xr24h-Tab (Loratadine-Pseudoephedrine) .... Once Daily As Needed  Allergies (verified): No Known Drug Allergies  Past History:  Past Medical History: Last updated: 04/12/2007 Coronary artery disease  01/04 Hypertension Hypothyroidism Myocardial infarction, hx  of, 2008   ICM cath 35-40% inflammatory bowel OSA dyslipidemia increased glucose Diabetes mellitus, type II Hyperlipidemia  Past Surgical History: Last updated: 04/12/2007 stent 01/04 stent LAD 2008  Family History: Last updated: 02/19/2008 Family History of Stomach Cancer:aunt and grandmother paternal  Social History: Last updated: 07/09/2009 Married Regular exercise-no Occupation:--retired.  Patient is a former smoker.  Alcohol Use - no Daily Caffeine Use His granddaughter ms Ovid Curd is an Therapist, sports on 2000 at [employer]  Risk Factors: Exercise: no (09/14/2007)  Risk Factors: Smoking Status: quit (06/08/2010)  Physical  Exam  General:  alert and well-developed.   Head:  normocephalic and atraumatic.   Eyes:  pupils equal and pupils round.   Ears:  R ear normal and L ear normal.   Neck:  No deformities, masses, or tenderness noted. Chest Wall:  No deformities, masses, tenderness or gynecomastia noted. Lungs:  normal respiratory effort and no intercostal retractions.   Heart:  normal rate and regular rhythm.   Abdomen:  Bowel sounds positive,abdomen soft and non-tender without masses, organomegaly or hernias noted.  obese Skin:  turgor normal and color normal.   1.5 cm erythematous lesion right distal calf Psych:  normally interactive and good eye contact.     Impression & Recommendations:  Problem # 1:  OBESITY (ICD-278.00)  His weight is his biggest problem.desperately needs to lose weight continue exercise at [facility]  Problem # 2:  DIABETES MELLITUS, TYPE II (ICD-250.00) continue current meds His updated medication list for this problem includes:    Benicar 40 Mg Tabs (Olmesartan medoxomil) .Marland Kitchen... Take 1 tablet by mouth once a day    Glyburide 2.5 Mg Tabs (Glyburide) .Marland Kitchen... Take 1 tablet by mouth once a day    Metformin Hcl 500 Mg Tabs (Metformin hcl) .Marland Kitchen... Take 1 tablet by mouth once a day    Aspirin Ec 325 Mg Tbec (Aspirin) ..... Once daily  Labs Reviewed: Creat: 0.9 (06/01/2010)     Last Eye Exam: normal-pt'r report (10/10/2009) Reviewed HgBA1c results: 7.1 (06/01/2010)  6.7 (01/20/2010)  Problem # 3:  HYPERLIPIDEMIA (ICD-272.4)  His updated medication list for this problem includes:    Simvastatin 40 Mg Tabs (Simvastatin) .Marland Kitchen... Take 1 tablet by mouth at bedtime  Labs Reviewed: SGOT: 18 (06/01/2010)   SGPT: 20 (06/01/2010)   HDL:21.40 (06/01/2010), 24.60 (01/20/2010)  LDL:25 (06/01/2010), 16 (01/20/2010)  Chol:85 (06/01/2010), 76 (01/20/2010)  Trig:194.0 (06/01/2010), 178.0 (01/20/2010)  Problem # 4:  CORONARY ARTERY DISEASE (ICD-414.00) HDL is low diet and exercise are  key needs to lose weight His updated medication list for this problem includes:    Benicar 40 Mg Tabs (Olmesartan medoxomil) .Marland Kitchen... Take 1 tablet by mouth once a day    Carvedilol 25 Mg Tabs (Carvedilol) ..... One tablet every morning    Furosemide 40 Mg Tabs (Furosemide) .Marland Kitchen... Take 1 tablet by mouth once a day    Plavix 75 Mg Tabs (Clopidogrel bisulfate) .Marland Kitchen... Take 1 tablet by mouth once a day    Aspirin Ec 325 Mg Tbec (Aspirin) ..... Once daily    Amlodipine Besylate 10 Mg Tabs (Amlodipine besylate) .Marland Kitchen... Take 1 tablet by mouth once a day    Carvedilol 12.5 Mg Tabs (Carvedilol) ..... One tablet every afternoon  Labs Reviewed: Chol: 85 (06/01/2010)   HDL: 21.40 (06/01/2010)   LDL: 25 (06/01/2010)   TG: 194.0 (06/01/2010)  Complete Medication List: 1)  Tra2p Study Drug  .... Take one tab once daily 2)  Simvastatin 40 Mg Tabs (Simvastatin) .... Take  1 tablet by mouth at bedtime 3)  Apriso 0.375 Gm Cp24 (Mesalamine) .... Take four tabs in am daily 4)  Benicar 40 Mg Tabs (Olmesartan medoxomil) .... Take 1 tablet by mouth once a day 5)  Carvedilol 25 Mg Tabs (Carvedilol) .... One tablet every morning 6)  Folic Acid 1 Mg Tabs (Folic acid) .... Take 1 tablet by mouth once a day 7)  Furosemide 40 Mg Tabs (Furosemide) .... Take 1 tablet by mouth once a day 8)  Glyburide 2.5 Mg Tabs (Glyburide) .... Take 1 tablet by mouth once a day 9)  Metformin Hcl 500 Mg Tabs (Metformin hcl) .... Take 1 tablet by mouth once a day 10)  Plavix 75 Mg Tabs (Clopidogrel bisulfate) .... Take 1 tablet by mouth once a day 11)  Synthroid 50 Mcg Tabs (Levothyroxine sodium) .... Take 1 tablet by mouth once a day 12)  Potassium Chloride Crys Cr 20 Meq Tbcr (Potassium chloride crys cr) .... Take 1 tablet by mouth once a day 13)  Aspirin Ec 325 Mg Tbec (Aspirin) .... Once daily 14)  B-12 250 Mcg Tabs (Cyanocobalamin) .... Once daily 15)  Triamcinolone Acetonide 0.5 % Crea (Triamcinolone acetonide) .... Apply bid to affected  area 16)  Osteo Bi-flex Regular Strength 250-200 Mg Tabs (Glucosamine-chondroitin) .... As directed 17)  Amlodipine Besylate 10 Mg Tabs (Amlodipine besylate) .... Take 1 tablet by mouth once a day 18)  Carvedilol 12.5 Mg Tabs (Carvedilol) .... One tablet every afternoon 19)  Claritin-d 24 Hour 10-240 Mg Xr24h-tab (Loratadine-pseudoephedrine) .... Once daily as needed  Patient Instructions: 1)  Please schedule a follow-up appointment in 4 months. 2)  labs one week prior to visit 3)  lipids---272.4 4)  lfts-995.2 5)  bmet-995.2 6)  A1C-250.02 7)     Prescriptions: BENICAR 40 MG TABS (OLMESARTAN MEDOXOMIL) Take 1 tablet by mouth once a day  #30 x 6   Entered by:   Rica Records, RN   Authorized by:   Phoebe Sharps MD   Signed by:   Rica Records, RN on 06/10/2010   Method used:   Electronically to        [facility] (retail)       [address]       [place], [place]  [address]       Ph: [phone] or [phone]       Fax: [phone]   RxID:   [id number]

## 2010-11-09 NOTE — Assessment & Plan Note (Signed)
Summary: COUGH, CONGESTION // RS   Vital Signs:  Patient profile:   65 year old male Weight:      302 pounds Temp:     98.1 degrees F oral Pulse rate:   72 / minute Pulse rhythm:   regular Resp:     18 per minute BP sitting:   136 / 70  (left arm) Cuff size:   large  Vitals Entered By: Rica Records, RN (December 08, 2009 11:03 AM) CC: c/o cough, head congestion and discolored sputum x 7-10 days, URI symptoms Is Patient Diabetic? Yes Did you bring your meter with you today? No   Primary Care Provider:  Phoebe Sharps, MD  CC:  c/o cough, head congestion and discolored sputum x 7-10 days, and URI symptoms.  History of Present Illness:  URI Symptoms      This is a 65 year old man who presents with URI symptoms.  The patient reports productive cough, but denies nasal congestion.  The patient denies fever.  The patient denies itchy watery eyes and itchy throat.  The patient denies the following risk factors for Strep/ sinusitis: unilateral facial pain.  10 days of illnes cough can be productive (green mucous) no wheeze  All other systems reviewed and were negative   Preventive Screening-Counseling & Management  Alcohol-Tobacco     Smoking Status: quit  Medications Prior to Update: 1)  Tra2p Study Drug .... Take One Tab Once Daily 2)  Simvastatin 40 Mg Tabs (Simvastatin) .... Take 1 Tablet By Mouth At Bedtime 3)  Apriso 0.375 Gm  Cp24 (Mesalamine) .... Take Four Tabs in Am Daily 4)  Benicar 40 Mg Tabs (Olmesartan Medoxomil) .... Take 1 Tablet By Mouth Once A Day 5)  Carvedilol 25 Mg  Tabs (Carvedilol) .Marland Kitchen.. 1 By Mouth Two Times A Day 6)  Folic Acid 1 Mg Tabs (Folic Acid) .... Take 1 Tablet By Mouth Once A Day 7)  Furosemide 40 Mg Tabs (Furosemide) .... Take 1 Tablet By Mouth Once A Day 8)  Glyburide 2.5 Mg Tabs (Glyburide) .... Take 1 Tablet By Mouth Once A Day 9)  Metformin Hcl 500 Mg Tabs (Metformin Hcl) .... Take 1 Tablet By Mouth Once A Day 10)  Plavix 75 Mg Tabs (Clopidogrel  Bisulfate) .... Take 1 Tablet By Mouth Once A Day 11)  Synthroid 50 Mcg Tabs (Levothyroxine Sodium) .... Take 1 Tablet By Mouth Once A Day 12)  Potassium Chloride Crys Cr 20 Meq  Tbcr (Potassium Chloride Crys Cr) .... Take 1 Tablet By Mouth Once A Day 13)  Aspirin Ec 325 Mg  Tbec (Aspirin) .... Once Daily 14)  B-12 250 Mcg  Tabs (Cyanocobalamin) .... Once Daily 15)  Triamcinolone Acetonide 0.5 % Crea (Triamcinolone Acetonide) .... Apply Bid To Affected Area 16)  Osteo Bi-Flex Regular Strength 250-200 Mg Tabs (Glucosamine-Chondroitin) .... As Directed 17)  Amlodipine Besylate 10 Mg Tabs (Amlodipine Besylate) .... Take 1 Tablet By Mouth Once A Day  Allergies (verified): No Known Drug Allergies  Past History:  Past Medical History: Last updated: 04/12/2007 Coronary artery disease  01/04 Hypertension Hypothyroidism Myocardial infarction, hx of, 2008   ICM cath 35-40% inflammatory bowel OSA dyslipidemia increased glucose Diabetes mellitus, type II Hyperlipidemia  Past Surgical History: Last updated: 04/12/2007 stent 01/04 stent LAD 2008  Family History: Last updated: 02/19/2008 Family History of Stomach Cancer:aunt and grandmother paternal  Social History: Last updated: 07/09/2009 Married Regular exercise-no Occupation:--retired.  Patient is a former smoker.  Alcohol Use - no  Daily Caffeine Use His granddaughter ms Ovid Curd is an Therapist, sports on 2000 at [employer]  Risk Factors: Exercise: no (09/14/2007)  Risk Factors: Smoking Status: quit (12/08/2009)  Physical Exam  General:  Well developed, well nourished, no acute distress.obese.   Head:  normocephalic and atraumatic.   Eyes:  pupils equal and pupils round.   Neck:  No deformities, masses, or tenderness noted. Lungs:  normal respiratory effort and no intercostal retractions.  few rhonch bilaterally Heart:  regular rhythm and no murmur.   Skin:  turgor normal and color normal.   Psych:  normally interactive and good eye  contact.     Impression & Recommendations:  Problem # 1:  BRONCHITIS-ACUTE (ICD-466.0) call if sxs persist, sooner if any worsening  antibiotics side effects discussed  Complete Medication List: 1)  Tra2p Study Drug  .... Take one tab once daily 2)  Simvastatin 40 Mg Tabs (Simvastatin) .... Take 1 tablet by mouth at bedtime 3)  Apriso 0.375 Gm Cp24 (Mesalamine) .... Take four tabs in am daily 4)  Benicar 40 Mg Tabs (Olmesartan medoxomil) .... Take 1 tablet by mouth once a day 5)  Carvedilol 25 Mg Tabs (Carvedilol) .Marland Kitchen.. 1 by mouth two times a day 6)  Folic Acid 1 Mg Tabs (Folic acid) .... Take 1 tablet by mouth once a day 7)  Furosemide 40 Mg Tabs (Furosemide) .... Take 1 tablet by mouth once a day 8)  Glyburide 2.5 Mg Tabs (Glyburide) .... Take 1 tablet by mouth once a day 9)  Metformin Hcl 500 Mg Tabs (Metformin hcl) .... Take 1 tablet by mouth once a day 10)  Plavix 75 Mg Tabs (Clopidogrel bisulfate) .... Take 1 tablet by mouth once a day 11)  Synthroid 50 Mcg Tabs (Levothyroxine sodium) .... Take 1 tablet by mouth once a day 12)  Potassium Chloride Crys Cr 20 Meq Tbcr (Potassium chloride crys cr) .... Take 1 tablet by mouth once a day 13)  Aspirin Ec 325 Mg Tbec (Aspirin) .... Once daily 14)  B-12 250 Mcg Tabs (Cyanocobalamin) .... Once daily 15)  Triamcinolone Acetonide 0.5 % Crea (Triamcinolone acetonide) .... Apply bid to affected area 16)  Osteo Bi-flex Regular Strength 250-200 Mg Tabs (Glucosamine-chondroitin) .... As directed 17)  Amlodipine Besylate 10 Mg Tabs (Amlodipine besylate) .... Take 1 tablet by mouth once a day Prescriptions: DOXYCYCLINE HYCLATE 100 MG CAPS (DOXYCYCLINE HYCLATE) Take 1 tab twice a day  #20 x 0   Entered and Authorized by:   Phoebe Sharps MD   Signed by:   Phoebe Sharps MD on 12/08/2009   Method used:   Electronically to        [facility] (retail)       [address]       [place], [place]  [address]       Ph:  [phone] or [phone]       Fax: [phone]   RxID:   [id number]

## 2010-11-09 NOTE — Miscellaneous (Signed)
Summary: Orders Update  Clinical Lists Changes  Orders: Added new Test order of Arterial Duplex Lower Extremity (Arterial Duplex Low) - Signed

## 2010-11-09 NOTE — Miscellaneous (Signed)
Summary: [facility] Progress Note  [facility] Progress Note   Imported By: Sallee Provencal 03/01/2010 15:46:44  _____________________________________________________________________  External Attachment:    Type:   Image     Comment:   External Document

## 2010-11-09 NOTE — Assessment & Plan Note (Signed)
Summary: 3 month rov/njr pt rsc/njr   Vital Signs:  Patient profile:   65 year old male Weight:      300 pounds Temp:     98.4 degrees F oral Pulse rate:   62 / minute Pulse rhythm:   regular Resp:     14 per minute BP sitting:   128 / 70  (left arm) Cuff size:   large  Vitals Entered By: Rica Records, RN (January 28, 2010 10:17 AM) CC: 3 month rov, labs done--CBGs 140 yesterday AM at home, states is higher in AM Is Patient Diabetic? Yes Did you bring your meter with you today? No   Primary Care Provider:  Phoebe Sharps, MD  CC:  3 month rov, labs done--CBGs 140 yesterday AM at home, and states is higher in AM.  History of Present Illness:  Follow-Up Visit      This is a 65 year old man who presents for Follow-up visit.  The patient denies chest pain and palpitations.  Since the last visit the patient notes no new problems or concerns and being seen by a specialist.  The patient reports taking meds as prescribed.  When questioned about possible medication side effects, the patient notes none.    All other systems reviewed and were negative   Preventive Screening-Counseling & Management  Alcohol-Tobacco     Smoking Status: quit  Current Problems (verified): 1)  Obesity  (ICD-278.00) 2)  Inflammatory Bowel Disease  (ICD-558.9) 3)  Sleep Apnea  (ICD-780.57) 4)  Hyperlipidemia  (ICD-272.4) 5)  Diabetes Mellitus, Type II  (ICD-250.00) 6)  Myocardial Infarction, Hx of  (ICD-412) 7)  Hypothyroidism  (ICD-244.9) 8)  Hypertension  (ICD-401.9) 9)  Coronary Artery Disease  (ICD-414.00)  Current Medications (verified): 1)  Tra2p Study Drug .... Take One Tab Once Daily 2)  Simvastatin 40 Mg Tabs (Simvastatin) .... Take 1 Tablet By Mouth At Bedtime 3)  Apriso 0.375 Gm  Cp24 (Mesalamine) .... Take Four Tabs in Am Daily 4)  Benicar 40 Mg Tabs (Olmesartan Medoxomil) .... Take 1 Tablet By Mouth Once A Day 5)  Carvedilol 25 Mg  Tabs (Carvedilol) .Marland Kitchen.. 1 By Mouth Two Times A Day 6)  Folic  Acid 1 Mg Tabs (Folic Acid) .... Take 1 Tablet By Mouth Once A Day 7)  Furosemide 40 Mg Tabs (Furosemide) .... Take 1 Tablet By Mouth Once A Day 8)  Glyburide 2.5 Mg Tabs (Glyburide) .... Take 1 Tablet By Mouth Once A Day 9)  Metformin Hcl 500 Mg Tabs (Metformin Hcl) .... Take 1 Tablet By Mouth Once A Day 10)  Plavix 75 Mg Tabs (Clopidogrel Bisulfate) .... Take 1 Tablet By Mouth Once A Day 11)  Synthroid 50 Mcg Tabs (Levothyroxine Sodium) .... Take 1 Tablet By Mouth Once A Day 12)  Potassium Chloride Crys Cr 20 Meq  Tbcr (Potassium Chloride Crys Cr) .... Take 1 Tablet By Mouth Once A Day 13)  Aspirin Ec 325 Mg  Tbec (Aspirin) .... Once Daily 14)  B-12 250 Mcg  Tabs (Cyanocobalamin) .... Once Daily 15)  Triamcinolone Acetonide 0.5 % Crea (Triamcinolone Acetonide) .... Apply Bid To Affected Area 16)  Osteo Bi-Flex Regular Strength 250-200 Mg Tabs (Glucosamine-Chondroitin) .... As Directed 17)  Amlodipine Besylate 10 Mg Tabs (Amlodipine Besylate) .... Take 1 Tablet By Mouth Once A Day  Allergies (verified): No Known Drug Allergies  Past History:  Past Medical History: Last updated: 04/12/2007 Coronary artery disease  01/04 Hypertension Hypothyroidism Myocardial infarction, hx of, 2008  ICM cath 35-40% inflammatory bowel OSA dyslipidemia increased glucose Diabetes mellitus, type II Hyperlipidemia  Past Surgical History: Last updated: 04/12/2007 stent 01/04 stent LAD 2008  Family History: Last updated: 02/19/2008 Family History of Stomach Cancer:aunt and grandmother paternal  Social History: Last updated: 07/09/2009 Married Regular exercise-no Occupation:--retired.  Patient is a former smoker.  Alcohol Use - no Daily Caffeine Use His granddaughter ms Ovid Curd is an Therapist, sports on 2000 at [employer]  Risk Factors: Exercise: no (09/14/2007)  Risk Factors: Smoking Status: quit (01/28/2010)  Physical Exam  General:  alert and well-developed.   Head:  normocephalic and atraumatic.    Eyes:  pupils equal and pupils round.   Ears:  R ear normal and L ear normal.   Neck:  No deformities, masses, or tenderness noted. Chest Wall:  No deformities, masses, tenderness or gynecomastia noted. Lungs:  normal respiratory effort and no intercostal retractions.   Heart:  normal rate and regular rhythm.   Abdomen:  Bowel sounds positive,abdomen soft and non-tender without masses, organomegaly or hernias noted.  obese Msk:  No deformity or scoliosis noted of thoracic or lumbar spine.   Pulses:  R radial normal and L radial normal.   Neurologic:  cranial nerves II-XII intact and gait normal.   Skin:  turgor normal and color normal.   Psych:  normally interactive and good eye contact.     Impression & Recommendations:  Problem # 1:  OBESITY (ICD-278.00)  His weight is his biggest problem. He is interested in  the rehabilitation program at [facility] we will try to facilitate that. note two pound weight loss  Problem # 2:  DIABETES MELLITUS, TYPE II (ICD-250.00) note improved A!C His updated medication list for this problem includes:    Benicar 40 Mg Tabs (Olmesartan medoxomil) .Marland Kitchen... Take 1 tablet by mouth once a day    Glyburide 2.5 Mg Tabs (Glyburide) .Marland Kitchen... Take 1 tablet by mouth once a day    Metformin Hcl 500 Mg Tabs (Metformin hcl) .Marland Kitchen... Take 1 tablet by mouth once a day    Aspirin Ec 325 Mg Tbec (Aspirin) ..... Once daily  Labs Reviewed: Creat: 1.0 (01/20/2010)     Last Eye Exam: normal-pt'r report (10/10/2009) Reviewed HgBA1c results: 6.7 (01/20/2010)  7.0 (10/15/2009)  Problem # 3:  HYPERLIPIDEMIA (ICD-272.4)  His updated medication list for this problem includes:    Simvastatin 40 Mg Tabs (Simvastatin) .Marland Kitchen... Take 1 tablet by mouth at bedtime  Labs Reviewed: Creat: 1.0 (01/20/2010)     Last Eye Exam: normal-pt'r report (10/10/2009) Reviewed HgBA1c results: 6.7 (01/20/2010)  7.0 (10/15/2009)  Labs Reviewed: SGOT: 22 (01/20/2010)   SGPT: 22  (01/20/2010)   HDL:24.60 (01/20/2010), 23.80 (10/15/2009)  LDL:16 (01/20/2010), 24 (10/15/2009)  Chol:76 (01/20/2010), 82 (10/15/2009)  Trig:178.0 (01/20/2010), 170.0 (10/15/2009)  Problem # 4:  CORONARY ARTERY DISEASE (ICD-414.00) in cardiac rehab His updated medication list for this problem includes:    Benicar 40 Mg Tabs (Olmesartan medoxomil) .Marland Kitchen... Take 1 tablet by mouth once a day    Carvedilol 25 Mg Tabs (Carvedilol) .Marland Kitchen... 1 by mouth two times a day    Furosemide 40 Mg Tabs (Furosemide) .Marland Kitchen... Take 1 tablet by mouth once a day    Plavix 75 Mg Tabs (Clopidogrel bisulfate) .Marland Kitchen... Take 1 tablet by mouth once a day    Aspirin Ec 325 Mg Tbec (Aspirin) ..... Once daily    Amlodipine Besylate 10 Mg Tabs (Amlodipine besylate) .Marland Kitchen... Take 1 tablet by mouth once a day  Labs Reviewed:  Chol: 76 (01/20/2010)   HDL: 24.60 (01/20/2010)   LDL: 16 (01/20/2010)   TG: 178.0 (01/20/2010)  Problem # 5:  SLEEP APNEA (ICD-780.57) will improve with weight loss  Complete Medication List: 1)  Tra2p Study Drug  .... Take one tab once daily 2)  Simvastatin 40 Mg Tabs (Simvastatin) .... Take 1 tablet by mouth at bedtime 3)  Apriso 0.375 Gm Cp24 (Mesalamine) .... Take four tabs in am daily 4)  Benicar 40 Mg Tabs (Olmesartan medoxomil) .... Take 1 tablet by mouth once a day 5)  Carvedilol 25 Mg Tabs (Carvedilol) .Marland Kitchen.. 1 by mouth two times a day 6)  Folic Acid 1 Mg Tabs (Folic acid) .... Take 1 tablet by mouth once a day 7)  Furosemide 40 Mg Tabs (Furosemide) .... Take 1 tablet by mouth once a day 8)  Glyburide 2.5 Mg Tabs (Glyburide) .... Take 1 tablet by mouth once a day 9)  Metformin Hcl 500 Mg Tabs (Metformin hcl) .... Take 1 tablet by mouth once a day 10)  Plavix 75 Mg Tabs (Clopidogrel bisulfate) .... Take 1 tablet by mouth once a day 11)  Synthroid 50 Mcg Tabs (Levothyroxine sodium) .... Take 1 tablet by mouth once a day 12)  Potassium Chloride Crys Cr 20 Meq Tbcr (Potassium chloride crys cr) .... Take 1  tablet by mouth once a day 13)  Aspirin Ec 325 Mg Tbec (Aspirin) .... Once daily 14)  B-12 250 Mcg Tabs (Cyanocobalamin) .... Once daily 15)  Triamcinolone Acetonide 0.5 % Crea (Triamcinolone acetonide) .... Apply bid to affected area 16)  Osteo Bi-flex Regular Strength 250-200 Mg Tabs (Glucosamine-chondroitin) .... As directed 17)  Amlodipine Besylate 10 Mg Tabs (Amlodipine besylate) .... Take 1 tablet by mouth once a day  Patient Instructions: 1)  Please schedule a follow-up appointment in 4 months.

## 2010-11-09 NOTE — Assessment & Plan Note (Signed)
Summary: 4 MNTH//SLM/pt rsc from bmp/cjr   Vital Signs:  Patient profile:   65 year old male Weight:      309 pounds Temp:     98.1 degrees F Pulse rate:   72 / minute Pulse rhythm:   regular Resp:     16 per minute BP sitting:   128 / 84  Vitals Entered By: Rica Records, RN (October 23, 2009 10:49 AM)   Primary Care Provider:  Phoebe Sharps, MD   History of Present Illness:  Follow-Up Visit      This is a 65 year old man who presents for Follow-up visit.  The patient denies chest pain, palpitations, dizziness, syncope, edema, SOB, DOE, PND, and orthopnea.  Since the last visit the patient notes no new problems or concerns.  The patient reports taking meds as prescribed.  When questioned about possible medication side effects, the patient notes none.    All other systems reviewed and were negative   Preventive Screening-Counseling & Management  Alcohol-Tobacco     Smoking Status: quit  Current Problems (verified): 1)  Obesity  (ICD-278.00) 2)  Actinic Keratosis  (ICD-702.0) 3)  Inflammatory Bowel Disease  (ICD-558.9) 4)  Sleep Apnea  (ICD-780.57) 5)  Hyperlipidemia  (ICD-272.4) 6)  Diabetes Mellitus, Type II  (ICD-250.00) 7)  Myocardial Infarction, Hx of  (ICD-412) 8)  Hypothyroidism  (ICD-244.9) 9)  Hypertension  (ICD-401.9) 10)  Coronary Artery Disease  (ICD-414.00)  Current Medications (verified): 1)  Tra2p Study Drug .... Take One Tab Once Daily 2)  Simvastatin 40 Mg Tabs (Simvastatin) .... Take 1 Tablet By Mouth At Bedtime 3)  Apriso 0.375 Gm  Cp24 (Mesalamine) .... Take Four Tabs in Am Daily 4)  Benicar 40 Mg Tabs (Olmesartan Medoxomil) .... Take 1 Tablet By Mouth Once A Day 5)  Carvedilol 25 Mg  Tabs (Carvedilol) .Marland Kitchen.. 1 By Mouth Two Times A Day 6)  Folic Acid 1 Mg Tabs (Folic Acid) .... Take 1 Tablet By Mouth Once A Day 7)  Furosemide 40 Mg Tabs (Furosemide) .... Take 1 Tablet By Mouth Once A Day 8)  Glyburide 2.5 Mg Tabs (Glyburide) .... Take 1 Tablet By Mouth  Once A Day 9)  Metformin Hcl 500 Mg Tabs (Metformin Hcl) .... Take 1 Tablet By Mouth Once A Day 10)  Plavix 75 Mg Tabs (Clopidogrel Bisulfate) .... Take 1 Tablet By Mouth Once A Day 11)  Synthroid 50 Mcg Tabs (Levothyroxine Sodium) .... Take 1 Tablet By Mouth Once A Day 12)  Potassium Chloride Crys Cr 20 Meq  Tbcr (Potassium Chloride Crys Cr) .... Take 1 Tablet By Mouth Once A Day 13)  Aspirin Ec 325 Mg  Tbec (Aspirin) .... Once Daily 14)  B-12 250 Mcg  Tabs (Cyanocobalamin) .... Once Daily 15)  Triamcinolone Acetonide 0.5 % Crea (Triamcinolone Acetonide) .... Apply Bid To Affected Area 16)  Osteo Bi-Flex Regular Strength 250-200 Mg Tabs (Glucosamine-Chondroitin) .... As Directed 17)  Amlodipine Besylate 10 Mg Tabs (Amlodipine Besylate) .... Take 1 Tablet By Mouth Once A Day  Allergies (verified): No Known Drug Allergies  Comments:  Nurse/Medical Assistant: 4 month rov, labs done--occasional CBG 125 at home  The patient's medications and allergies were reviewed with the patient and were updated in the Medication and Allergy Lists. Rica Records, RN (October 23, 2009 10:53 AM)  Past History:  Past Medical History: Last updated: 04/12/2007 Coronary artery disease  01/04 Hypertension Hypothyroidism Myocardial infarction, hx of, 2008   ICM cath 35-40% inflammatory bowel OSA  dyslipidemia increased glucose Diabetes mellitus, type II Hyperlipidemia  Past Surgical History: Last updated: 04/12/2007 stent 01/04 stent LAD 2008  Family History: Last updated: 02/19/2008 Family History of Stomach Cancer:aunt and grandmother paternal  Social History: Last updated: 07/09/2009 Married Regular exercise-no Occupation:--retired.  Patient is a former smoker.  Alcohol Use - no Daily Caffeine Use His granddaughter ms Ovid Curd is an Therapist, sports on 2000 at [employer]  Risk Factors: Exercise: no (09/14/2007)  Risk Factors: Smoking Status: quit (10/23/2009)  Review of Systems       All other  systems reviewed and were negative   Physical Exam  General:  Well developed, well nourished, no acute distress.obese.   Head:  normocephalic and atraumatic.   Eyes:  pupils equal and pupils round.   Neck:  No deformities, masses, or tenderness noted. Chest Wall:  No deformities, masses, tenderness or gynecomastia noted. Lungs:  normal respiratory effort and no accessory muscle use.   Heart:  normal rate and regular rhythm.   Abdomen:  Bowel sounds positive,abdomen soft and non-tender without masses, organomegaly or hernias noted.  obese Msk:  No deformity or scoliosis noted of thoracic or lumbar spine.   Pulses:  R radial normal and L radial normal.   Neurologic:  cranial nerves II-XII intact and gait normal.    Diabetes Management Exam:    Eye Exam:       Eye Exam done elsewhere          Date: 10/10/2009          Results: normal-pt'r report          Done by: ophthal   Impression & Recommendations:  Problem # 1:  OBESITY (ICD-278.00) he still understands need for weight loss  Problem # 2:  HYPERLIPIDEMIA (ICD-272.4) controlled continue current medications  His updated medication list for this problem includes:    Simvastatin 40 Mg Tabs (Simvastatin) .Marland Kitchen... Take 1 tablet by mouth at bedtime  Labs Reviewed: SGOT: 22 (10/15/2009)   SGPT: 21 (10/15/2009)   HDL:23.80 (10/15/2009), 22.40 (06/01/2009)  LDL:24 (10/15/2009), DEL (07/14/2008)  Chol:82 (10/15/2009), 106 (06/01/2009)  Trig:170.0 (10/15/2009), 264.0 (06/01/2009)  Problem # 3:  DIABETES MELLITUS, TYPE II (ICD-250.00) his major issue is obesity encouraged weight loss continue current medications  His updated medication list for this problem includes:    Benicar 40 Mg Tabs (Olmesartan medoxomil) .Marland Kitchen... Take 1 tablet by mouth once a day    Glyburide 2.5 Mg Tabs (Glyburide) .Marland Kitchen... Take 1 tablet by mouth once a day    Metformin Hcl 500 Mg Tabs (Metformin hcl) .Marland Kitchen... Take 1 tablet by mouth once a day    Aspirin Ec 325 Mg  Tbec (Aspirin) ..... Once daily  Labs Reviewed: Creat: 1.0 (10/15/2009)     Last Eye Exam: normal-pt's report (10/10/2008) Reviewed HgBA1c results: 7.0 (10/15/2009)  7.0 (06/01/2009)  Problem # 4:  SLEEP APNEA (ICD-780.57) CPAP  Problem # 5:  HYPOTHYROIDISM (ICD-244.9)  His updated medication list for this problem includes:    Synthroid 50 Mcg Tabs (Levothyroxine sodium) .Marland Kitchen... Take 1 tablet by mouth once a day  Labs Reviewed: TSH: 1.35 (02/02/2009)    HgBA1c: 7.0 (10/15/2009) Chol: 82 (10/15/2009)   HDL: 23.80 (10/15/2009)   LDL: 24 (10/15/2009)   TG: 170.0 (10/15/2009)  Complete Medication List: 1)  Tra2p Study Drug  .... Take one tab once daily 2)  Simvastatin 40 Mg Tabs (Simvastatin) .... Take 1 tablet by mouth at bedtime 3)  Apriso 0.375 Gm Cp24 (Mesalamine) .... Take four tabs  in am daily 4)  Benicar 40 Mg Tabs (Olmesartan medoxomil) .... Take 1 tablet by mouth once a day 5)  Carvedilol 25 Mg Tabs (Carvedilol) .Marland Kitchen.. 1 by mouth two times a day 6)  Folic Acid 1 Mg Tabs (Folic acid) .... Take 1 tablet by mouth once a day 7)  Furosemide 40 Mg Tabs (Furosemide) .... Take 1 tablet by mouth once a day 8)  Glyburide 2.5 Mg Tabs (Glyburide) .... Take 1 tablet by mouth once a day 9)  Metformin Hcl 500 Mg Tabs (Metformin hcl) .... Take 1 tablet by mouth once a day 10)  Plavix 75 Mg Tabs (Clopidogrel bisulfate) .... Take 1 tablet by mouth once a day 11)  Synthroid 50 Mcg Tabs (Levothyroxine sodium) .... Take 1 tablet by mouth once a day 12)  Potassium Chloride Crys Cr 20 Meq Tbcr (Potassium chloride crys cr) .... Take 1 tablet by mouth once a day 13)  Aspirin Ec 325 Mg Tbec (Aspirin) .... Once daily 14)  B-12 250 Mcg Tabs (Cyanocobalamin) .... Once daily 15)  Triamcinolone Acetonide 0.5 % Crea (Triamcinolone acetonide) .... Apply bid to affected area 16)  Osteo Bi-flex Regular Strength 250-200 Mg Tabs (Glucosamine-chondroitin) .... As directed 17)  Amlodipine Besylate 10 Mg Tabs  (Amlodipine besylate) .... Take 1 tablet by mouth once a day  Patient Instructions: 1)  Please schedule a follow-up appointment in 4 months. 2)  labs one week prior to visit 3)  lipids---272.4 4)  lfts-995.2 5)  bmet-995.2 6)  A1C-250.02 7)

## 2010-11-09 NOTE — Miscellaneous (Signed)
Summary: Complications with Definity  Clinical Lists Changes Patient was getting definity with echo.  At the completion of definity, patient started having back pain which progressed to some chest pain 3/10.  Patient BP 110/70, pulse 73,O2sat 95%.  Patients chest pain relieved 50mns after definity.  Dr. BOlevia Perchesnotified of these symptoms and he gave orders to proceed with the treadmill stress test.

## 2010-11-09 NOTE — Progress Notes (Signed)
Summary: REFILL REQUEST (Synthroid)  Phone Note Refill Request Message from:  Fax from Pharmacy on March 30, 2010 10:42 AM  Refills Requested: Medication #1:  SYNTHROID 50 MCG TABS Take 1 tablet by mouth once a day   Notes: Economist.    Initial call taken by: Duanne Moron,  March 30, 2010 10:43 AM  Follow-up for Phone Call        See Rx. Follow-up by: Rica Records, RN,  March 30, 2010 12:30 PM    Prescriptions: SYNTHROID 50 MCG TABS (LEVOTHYROXINE SODIUM) Take 1 tablet by mouth once a day  #90 x 3   Entered by:   Rica Records, RN   Authorized by:   Phoebe Sharps MD   Signed by:   Rica Records, RN on 03/30/2010   Method used:   Electronically to        [facility] (retail)       [address]       [place], [place]  [address]       Ph: [phone] or [phone]       Fax: [phone]   RxID:   [id number]

## 2010-11-09 NOTE — Consult Note (Signed)
Summary: [facility]  [facility]   Imported By: Sallee Provencal 12/18/2009 16:32:11  _____________________________________________________________________  External Attachment:    Type:   Image     Comment:   External Document

## 2010-11-09 NOTE — Assessment & Plan Note (Signed)
Summary: rov  Medications Added SIMVASTATIN 20 MG TABS (SIMVASTATIN) Take one tablet by mouth daily at bedtime      Allergies Added: NKDA  Visit Type:  rov Referring Provider:  n/a Primary Provider:  Phoebe Sharps, MD  CC:  pt offers no cardiac complaints today.  History of Present Illness: [name] is 65 years old and return for management of CAD. In 2004 he had a non-ST elevation MI treated with a drug-eluting stent to the LAD. 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 He was treated early in his ejection fraction recovered to 55%.  He has done well since that time. He is enrolled in the rehabilitation program has been exercising and has been losing some weight. He lost about 18 pounds.  His other problems include hypertension, hyperlipidemia, and diabetes.  He has a daughter who is an Therapist, sports at [employer].  we talked about him following up with [name] next year in followup.  Current Medications (verified): 1)  Simvastatin 40 Mg Tabs (Simvastatin) .... Take 1 Tablet By Mouth At Bedtime 2)  Apriso 0.375 Gm  Cp24 (Mesalamine) .... Take Four Tabs in Am Daily 3)  Benicar 40 Mg Tabs (Olmesartan Medoxomil) .... Take 1 Tablet By Mouth Once A Day 4)  Carvedilol 25 Mg Tabs (Carvedilol) .... One Tablet Every Morning 5)  Folic Acid 1 Mg Tabs (Folic Acid) .... Take 1 Tablet By Mouth Once A Day 6)  Furosemide 40 Mg Tabs (Furosemide) .... Take 1 Tablet By Mouth Once A Day 7)  Glyburide 2.5 Mg Tabs (Glyburide) .... Take 1 Tablet By Mouth Once A Day 8)  Metformin Hcl 500 Mg Tabs (Metformin Hcl) .... Take 1 Tablet By Mouth Once A Day 9)  Plavix 75 Mg Tabs (Clopidogrel Bisulfate) .... Take 1 Tablet By Mouth Once A Day 10)  Synthroid 50 Mcg Tabs (Levothyroxine Sodium) .... Take 1 Tablet By Mouth Once A Day 11)  Potassium Chloride Crys Cr 20 Meq  Tbcr (Potassium Chloride Crys Cr) .... Take 1 Tablet By Mouth Once A Day 12)  Aspirin Ec 325 Mg  Tbec  (Aspirin) .... Once Daily 13)  Triamcinolone Acetonide 0.5 % Crea (Triamcinolone Acetonide) .... Apply Bid To Affected Area 14)  Osteo Bi-Flex Regular Strength 250-200 Mg Tabs (Glucosamine-Chondroitin) .... As Directed 15)  Amlodipine Besylate 10 Mg Tabs (Amlodipine Besylate) .... Take 1 Tablet By Mouth Once A Day 16)  Carvedilol 12.5 Mg Tabs (Carvedilol) .... One Tablet Every Afternoon 17)  Claritin-D 24 Hour 10-240 Mg Xr24h-Tab (Loratadine-Pseudoephedrine) .... Once Daily As Needed  Allergies (verified): No Known Drug Allergies  Past History:  Past Medical History: Reviewed history from 04/12/2007 and no changes required. Coronary artery disease  01/04 Hypertension Hypothyroidism Myocardial infarction, hx of, 2008   ICM cath 35-40% inflammatory bowel OSA dyslipidemia increased glucose Diabetes mellitus, type II Hyperlipidemia  Review of Systems       ROS is negative except as outlined in HPI.   Vital Signs:  Patient profile:   65 year old male Height:      70 inches Weight:      298.8 pounds BMI:     43.03 Pulse rate:   60 / minute Pulse rhythm:   irregular BP sitting:   118 / 70  (left arm) Cuff size:   large  Vitals Entered By: Julaine Hua, CMA (August 06, 2010 11:38 AM)  Physical Exam  Additional Exam:  Gen. Well-nourished, in no distress  Neck: No JVD, thyroid not enlarged, no carotid bruits Lungs: No tachypnea, clear without rales, rhonchi or wheezes Cardiovascular: Rhythm regular, PMI not displaced,  heart sounds  normal, no murmurs or gallops, no peripheral edema, pulses normal in all 4 extremities. Abdomen: BS normal, abdomen soft and non-tender without masses or organomegaly, no hepatosplenomegaly. MS: No deformities, no cyanosis or clubbing   Neuro:  No focal sns   Skin:  no lesions    Impression & Recommendations:  Problem # 1:  CORONARY ARTERY DISEASE (ICD-414.00) He has had previous MIs and PCI's as described in the history of present illness.  He's had no recent chest pain but he has had some shortness of breath with exertion. He hasn't had evaluation of his LV function and some time and we will plan a 2-D echocardiogram and a stress ECG. His updated medication list for this problem includes:    Carvedilol 25 Mg Tabs (Carvedilol) ..... One tablet every morning    Plavix 75 Mg Tabs (Clopidogrel bisulfate) .Marland Kitchen... Take 1 tablet by mouth once a day    Aspirin Ec 325 Mg Tbec (Aspirin) ..... Once daily    Amlodipine Besylate 10 Mg Tabs (Amlodipine besylate) .Marland Kitchen... Take 1 tablet by mouth once a day    Carvedilol 12.5 Mg Tabs (Carvedilol) ..... One tablet every afternoon  Orders: Treadmill (Treadmill) Echocardiogram (Echo)  Problem # 2:  HYPERLIPIDEMIA (ICD-272.4) He had a recent lipid profile and his HDL was 26. I encouraged him on weight reduction, exercise, and low bad carbohydrate diet. His updated medication list for this problem includes:    Simvastatin 20 Mg Tabs (Simvastatin) .Marland Kitchen... Take one tablet by mouth daily at bedtime  Orders: Treadmill (Treadmill) Echocardiogram (Echo)  Problem # 3:  HYPERTENSION (ICD-401.9) This is controlled on current medications. His updated medication list for this problem includes:    Benicar 40 Mg Tabs (Olmesartan medoxomil) .Marland Kitchen... Take 1 tablet by mouth once a day    Carvedilol 25 Mg Tabs (Carvedilol) ..... One tablet every morning    Furosemide 40 Mg Tabs (Furosemide) .Marland Kitchen... Take 1 tablet by mouth once a day    Aspirin Ec 325 Mg Tbec (Aspirin) ..... Once daily    Amlodipine Besylate 10 Mg Tabs (Amlodipine besylate) .Marland Kitchen... Take 1 tablet by mouth once a day    Carvedilol 12.5 Mg Tabs (Carvedilol) ..... One tablet every afternoon  Orders: Treadmill (Treadmill) Echocardiogram (Echo)  Patient Instructions: 1)  Decrease simvastatin to 52m once daily. 2)  Your physician has requested that you have an echocardiogram.  Echocardiography is a painless test that uses sound waves to create images of your  heart. It provides your doctor with information about the size and shape of your heart and how well your heart's chambers and valves are working.  This procedure takes approximately one hour. There are no restrictions for this procedure. 3)  Your physician has requested that you have an exercise tolerance test with Dr. BOlevia Perches  For further information please visit [email or web address]  Please also follow instruction sheet, as given. Prescriptions: SIMVASTATIN 20 MG TABS (SIMVASTATIN) Take one tablet by mouth daily at bedtime  #90 x 3   Entered by:   HAlvis Lemmings RN, BSN   Authorized by:   BFatima Sanger MD, [facility]  Signed by:   HAlvis Lemmings RN, BSN on 08/06/2010   Method used:   Print then Give to Patient   RxID:   [id number]

## 2010-11-09 NOTE — Miscellaneous (Signed)
Summary: Appointment Canceled  Appointment status changed to canceled by [employer] on 08/06/2010 1:03 PM.  Cancellation Comments --------------------- dx  Appointment Information ----------------------- Appt Type:  CARDIOLOGY ANCILLARY VISIT      Date:  Thursday, August 26, 2010      Time:  4:00 PM for 60 min   Urgency:  Routine   Made By:  Wynelle Cleveland  To Visit:  [id number]    Reason:  dx  Appt Comments ------------- -- 08/06/10 13:03: ([facility]EM[facility]) CANCELED -- dx -- 08/06/10 13:01: ([facility]) BOOKED -- Routine CARDIOLOGY ANCILLARY VISIT at 08/26/2010 4:00 PM for 60 min dx

## 2010-11-09 NOTE — Progress Notes (Signed)
Summary: Rx Refill  Phone Note Refill Request Message from:  Fax from Pharmacy on July 23, 2010 3:10 PM  Refills Requested: Medication #1:  SIMVASTATIN 40 MG TABS Take 1 tablet by mouth at bedtime   Last Refilled: 04/16/2010   Notes: #30 x6  Method Requested: Electronic Initial call taken by: Candace Cruise,  July 23, 2010 3:11 PM    Prescriptions: SIMVASTATIN 40 MG TABS (SIMVASTATIN) Take 1 tablet by mouth at bedtime  #30 x 6   Entered by:   Allyne Gee, LPN   Authorized by:   Phoebe Sharps MD   Signed by:   Allyne Gee, LPN on 40/05/6760   Method used:   Electronically to        [facility] (retail)       [address]       [place], [name]  [address]       Ph: [phone] or [phone]       Fax: [phone]   RxID:   [id number]

## 2010-11-09 NOTE — Letter (Signed)
Summary: [facility]  [facility]   Imported By: Marilynne Drivers 09/14/2010 13:33:23  _____________________________________________________________________  External Attachment:    Type:   Image     Comment:   External Document

## 2010-11-09 NOTE — Progress Notes (Signed)
Summary: Medication Refill   Phone Note Call from Patient Call back at Home Phone [phone]   Caller: Patient Call For: Dr. Sharlett Iles Reason for Call: Refill Medication Summary of Call: Pt is needing his Apriso and Folic Acid refilled. He scheduled an appt. for 10-29-09 Initial call taken by: Webb Laws,  October 14, 2009 12:08 PM  Follow-up for Phone Call        Refills sent to pharmacy. Follow-up by: Alberteen Spindle RN,  October 14, 2009 12:30 PM    Prescriptions: FOLIC ACID 1 MG TABS (FOLIC ACID) Take 1 tablet by mouth once a day  #90 x 3   Entered by:   Alberteen Spindle RN   Authorized by:   Sable Feil MD [facility]   Signed by:   Alberteen Spindle RN on 10/14/2009   Method used:   Electronically to        [facility] (retail)       [address]       [place], [place]  [address]       Ph: [phone] or [phone]       Fax: [phone]   RxID:   [id number] APRISO 0.375 GM  CP24 (MESALAMINE) Take four tabs in am daily  #180 x 6   Entered by:   Alberteen Spindle RN   Authorized by:   Sable Feil MD [facility]   Signed by:   Alberteen Spindle RN on 10/14/2009   Method used:   Electronically to        [facility] (retail)       [address]       [place], [place]  [address]       Ph: [phone] or [phone]       Fax: [phone]   RxID:   [id number]

## 2010-11-10 ENCOUNTER — Ambulatory Visit (HOSPITAL_COMMUNITY): Payer: BC Managed Care – PPO | Attending: Cardiovascular Disease

## 2010-11-11 NOTE — Miscellaneous (Signed)
Summary: [facility]   [facility]   Imported By: Sallee Provencal 10/21/2010 16:39:57  _____________________________________________________________________  External Attachment:    Type:   Image     Comment:   External Document

## 2010-11-11 NOTE — Letter (Signed)
Summary: [facility]   Imported By: Marilynne Drivers 10/19/2010 16:58:35  _____________________________________________________________________  External Attachment:    Type:   Image     Comment:   External Document

## 2010-11-11 NOTE — Miscellaneous (Signed)
Summary: Exercise Flow Sheet/[facility]  Exercise Flow Sheet/[facility]   Imported By: Laural Benes 09/23/2010 13:28:44  _____________________________________________________________________  External Attachment:    Type:   Image     Comment:   External Document

## 2010-11-11 NOTE — Progress Notes (Signed)
Summary: Refill request   Phone Note Call from Patient   Caller: Pt walked in to office wants to see nurse Call For: Dr Sharlett Iles Reason for Call: Talk to Nurse Summary of Call: Needs a refill for Folic Acid is all out. I adviced him he needed an appt first. He can olnly do after lunch because of some heart therapy he doen in the am. First available pm is 11-23-2010, I put him in at 1:45pm. Would like his medicine refilled at least until then. He left office and does not need call back unless he is not getting the refill. Initial call taken by: Irwin Brakeman [facility],  October 27, 2010 11:41 AM  Follow-up for Phone Call        rx refilled pt must keep office visit,  Follow-up by: Bernita Buffy CMA ([place]),  October 27, 2010 11:58 AM    Prescriptions: FOLIC ACID 1 MG TABS (FOLIC ACID) Take 1 tablet by mouth once a day...MUST HAVE OFFICE VIST  #30 x 0   Entered by:   Bernita Buffy CMA ([place])   Authorized by:   Sable Feil MD [facility]   Signed by:   Bernita Buffy CMA ([place]) on 10/27/2010   Method used:   Electronically to        [facility] (retail)       [address]       [place], [place]  [address]       Ph: [phone] or [phone]       Fax: [phone]   RxID:   [id number]

## 2010-11-11 NOTE — Progress Notes (Signed)
   Walk in Patient Form Recieved " Pt needs Refill on Carvediol" sent to Message Nurse [facility]  October 08, 2010 11:51 AM     Appended Document: Carvedilol refill adv pt that refill sent. Joan Flores, RN, BSN    Clinical Lists Changes  Medications: Rx of CARVEDILOL 25 MG TABS (CARVEDILOL) one tablet every morning;  #60 x 11;  Signed;  Entered by: Joan Flores RN;  Authorized by: Fatima Sanger, MD, [facility];  Method used: Electronically to [facility] [address] [place], [place], [place]  [address], Ph: [phone] or [phone], Fax: [phone]    Prescriptions: CARVEDILOL 25 MG TABS (CARVEDILOL) one tablet every morning  #60 x 11   Entered by:   Joan Flores RN   Authorized by:   Fatima Sanger, MD, [facility]   Signed by:   Joan Flores RN on 10/08/2010   Method used:   Electronically to        [facility] (retail)       [address]       [place], [place]  [address]       Ph: [phone] or [phone]       Fax: [phone]   RxID:   [id number]    Appended Document: Correct for Carvedilol per pharmacy    Clinical Lists Changes  Medications: Rx of CARVEDILOL 12.5 MG TABS (CARVEDILOL) one tablet every afternoon;  #30 x 11;  Signed;  Entered by: Joan Flores RN;  Authorized by: Fatima Sanger, MD, [facility];  Method used: Electronically to [facility]*, [address], [place], [place], [place]  [address], Ph: [phone] or [phone], Fax: [phone]    Prescriptions: CARVEDILOL 12.5 MG TABS (CARVEDILOL) one tablet every afternoon  #30 x 11   Entered by:   Joan Flores RN   Authorized by:   Fatima Sanger, MD, [facility]   Signed by:   Joan Flores RN on 10/08/2010   Method used:   Electronically to        [facility] (retail)       [address]       [place],   [address]       Ph: [phone] or  [phone]       Fax: [phone]   RxID:   [id number]

## 2010-11-11 NOTE — Assessment & Plan Note (Signed)
Summary: 4 MONTH ROV/[facility] rsc bmp/njr   Vital Signs:  Patient profile:   65 year old male Weight:      298 pounds Temp:     98.4 degrees F oral Pulse rate:   68 / minute Pulse rhythm:   regular BP sitting:   132 / 64  (left arm) Cuff size:   large  Vitals Entered By: Townsend Roger, CMA (September 22, 2010 11:50 AM) CC: 25mh f/u, look at finger   Primary Care Provider:  BPhoebe Sharps MD  CC:  437m f/u and look at finger.  History of Present Illness: injury 2nd finger left---need tdap.   BP elevated at cardiac rehab--BP since has been fine.   All other systems reviewed and were negative     Current Medications (verified): 1)  Simvastatin 20 Mg Tabs (Simvastatin) .... Take One Tablet By Mouth Daily At Bedtime 2)  Apriso 0.375 Gm  Cp24 (Mesalamine) .... Take Four Tabs in Am Daily 3)  Benicar 40 Mg Tabs (Olmesartan Medoxomil) .... Take 1 Tablet By Mouth Once A Day 4)  Carvedilol 25 Mg Tabs (Carvedilol) .... One Tablet Every Morning 5)  Folic Acid 1 Mg Tabs (Folic Acid) .... Take 1 Tablet By Mouth Once A Day 6)  Furosemide 40 Mg Tabs (Furosemide) .... Take 1 Tablet By Mouth Once A Day 7)  Glyburide 2.5 Mg Tabs (Glyburide) .... Take 1 Tablet By Mouth Once A Day 8)  Metformin Hcl 500 Mg Tabs (Metformin Hcl) .... Take 1 Tablet By Mouth Once A Day 9)  Plavix 75 Mg Tabs (Clopidogrel Bisulfate) .... Take 1 Tablet By Mouth Once A Day 10)  Synthroid 50 Mcg Tabs (Levothyroxine Sodium) .... Take 1 Tablet By Mouth Once A Day 11)  Potassium Chloride Crys Cr 20 Meq  Tbcr (Potassium Chloride Crys Cr) .... Take 1 Tablet By Mouth Once A Day 12)  Aspirin Ec 325 Mg  Tbec (Aspirin) .... Once Daily 13)  Triamcinolone Acetonide 0.5 % Crea (Triamcinolone Acetonide) .... Apply Bid To Affected Area 14)  Osteo Bi-Flex Regular Strength 250-200 Mg Tabs (Glucosamine-Chondroitin) .... As Directed 15)  Amlodipine Besylate 10 Mg Tabs (Amlodipine Besylate) .... Take 1 Tablet By Mouth Once A Day 16)  Carvedilol  12.5 Mg Tabs (Carvedilol) .... One Tablet Every Afternoon 17)  Claritin-D 24 Hour 10-240 Mg Xr24h-Tab (Loratadine-Pseudoephedrine) .... Once Daily As Needed  Allergies (verified): No Known Drug Allergies  Past History:  Past Medical History: Last updated: 04/12/2007 Coronary artery disease  01/04 Hypertension Hypothyroidism Myocardial infarction, hx of, 2008   ICM cath 35-40% inflammatory bowel OSA dyslipidemia increased glucose Diabetes mellitus, type II Hyperlipidemia  Past Surgical History: Last updated: 04/12/2007 stent 01/04 stent LAD 2008  Family History: Last updated: 02/19/2008 Family History of Stomach Cancer:aunt and grandmother paternal  Social History: Last updated: 07/09/2009 Married Regular exercise-no Occupation:--retired.  Patient is a former smoker.  Alcohol Use - no Daily Caffeine Use His granddaughter ms PeOvid Curds an RNTherapist, sportsn 2000 [facility]: Exercise: no (09/14/2007)  Risk Factors: Smoking Status: quit (06/08/2010)  Physical Exam  General:  overweight male in no acute distress. HEENT exam atraumatic, normocephalic symmetric muscles are intact. Neck is supple. Chest clear auscultation cardiac exam S1-S2 are regular. Abdominal exam obese, to bowel sounds, soft. He has an injury to his left index finger. It's covered with Steri-Strips and a Band-Aid. Was able see the skin around the wound no erythema. No palpable fluctuance.   Impression & Recommendations:  Problem #  1:  HYPERTENSION (ICD-401.9)  His updated medication list for this problem includes:    Benicar 40 Mg Tabs (Olmesartan medoxomil) .Marland Kitchen... Take 1 tablet by mouth once a day    Carvedilol 25 Mg Tabs (Carvedilol) ..... One tablet every morning    Furosemide 40 Mg Tabs (Furosemide) .Marland Kitchen... Take 1 tablet by mouth once a day    Amlodipine Besylate 10 Mg Tabs (Amlodipine besylate) .Marland Kitchen... Take 1 tablet by mouth once a day    Carvedilol 12.5 Mg Tabs (Carvedilol) ..... One tablet  every afternoon  BP today: 132/64 Prior BP: 142/74 (08/26/2010)  Prior 10 Yr Risk Heart Disease: N/A (08/26/2010)  Labs Reviewed: K+: 4.2 (09/16/2010) Creat: : 1.0 (09/16/2010)   Chol: 71 (09/16/2010)   HDL: 18.30 (09/16/2010)   LDL: 22 (09/16/2010)   TG: 156.0 (09/16/2010)  Complete Medication List: 1)  Simvastatin 20 Mg Tabs (Simvastatin) .... Take one tablet by mouth daily at bedtime 2)  Apriso 0.375 Gm Cp24 (Mesalamine) .... Take four tabs in am daily 3)  Benicar 40 Mg Tabs (Olmesartan medoxomil) .... Take 1 tablet by mouth once a day 4)  Carvedilol 25 Mg Tabs (Carvedilol) .... One tablet every morning 5)  Folic Acid 1 Mg Tabs (Folic acid) .... Take 1 tablet by mouth once a day 6)  Furosemide 40 Mg Tabs (Furosemide) .... Take 1 tablet by mouth once a day 7)  Glyburide 2.5 Mg Tabs (Glyburide) .... Take 1 tablet by mouth once a day 8)  Metformin Hcl 500 Mg Tabs (Metformin hcl) .... Take 1 tablet by mouth once a day 9)  Plavix 75 Mg Tabs (Clopidogrel bisulfate) .... Take 1 tablet by mouth once a day 10)  Synthroid 50 Mcg Tabs (Levothyroxine sodium) .... Take 1 tablet by mouth once a day 11)  Potassium Chloride Crys Cr 20 Meq Tbcr (Potassium chloride crys cr) .... Take 1 tablet by mouth once a day 12)  Aspirin Ec 325 Mg Tbec (Aspirin) .... Once daily 13)  Triamcinolone Acetonide 0.5 % Crea (Triamcinolone acetonide) .... Apply bid to affected area 14)  Osteo Bi-flex Regular Strength 250-200 Mg Tabs (Glucosamine-chondroitin) .... As directed 15)  Amlodipine Besylate 10 Mg Tabs (Amlodipine besylate) .... Take 1 tablet by mouth once a day 16)  Carvedilol 12.5 Mg Tabs (Carvedilol) .... One tablet every afternoon 17)  Claritin-d 24 Hour 10-240 Mg Xr24h-tab (Loratadine-pseudoephedrine) .... Once daily as needed  Other Orders: Tdap => 41yr IM (([id number] Admin 1st Vaccine ([phone]  Patient Instructions: 1)  4 months 2)  labs one week prior to visit 3)  lipids---272.4 4)  lfts-995.2 5)   bmet-995.2 6)  A1C-250.02 7)       Orders Added: 1)  Tdap => 720yrIM [9[85631])  Admin 1st Vaccine [90471] 3)  Est. Patient Level III [9[49702] Immunizations Administered:  Tetanus Vaccine:    Vaccine Type: Tdap    Site: right deltoid    Mfr: GlaxoSmithKline    Dose: 0.5 ml    Route: IM    Given by: CiTownsend RogerCMA    Exp. Date: 07/29/2012    Lot #: [id number] Immunizations Administered:  Tetanus Vaccine:    Vaccine Type: Tdap    Site: right deltoid    Mfr: GlaxoSmithKline    Dose: 0.5 ml    Route: IM    Given by: CiTownsend RogerCMA    Exp. Date: 07/29/2012    Lot #: [id number]

## 2010-11-11 NOTE — Assessment & Plan Note (Signed)
Summary: LEFT INDEX FINGER INJURED//SLM   Vital Signs:  Patient profile:   65 year old male Weight:      294 pounds Temp:     98.2 degrees F oral BP sitting:   116 / 74  (left arm) Cuff size:   large  Vitals Entered By: Townsend Roger, CMA (October 01, 2010 11:29 AM) CC: f/u on finger    CC:  f/u on finger .  Current Medications (verified): 1)  Simvastatin 20 Mg Tabs (Simvastatin) .... Take One Tablet By Mouth Daily At Bedtime 2)  Apriso 0.375 Gm  Cp24 (Mesalamine) .... Take Four Tabs in Am Daily 3)  Benicar 40 Mg Tabs (Olmesartan Medoxomil) .... Take 1 Tablet By Mouth Once A Day 4)  Carvedilol 25 Mg Tabs (Carvedilol) .... One Tablet Every Morning 5)  Folic Acid 1 Mg Tabs (Folic Acid) .... Take 1 Tablet By Mouth Once A Day 6)  Furosemide 40 Mg Tabs (Furosemide) .... Take 1 Tablet By Mouth Once A Day 7)  Glyburide 2.5 Mg Tabs (Glyburide) .... Take 1 Tablet By Mouth Once A Day 8)  Metformin Hcl 500 Mg Tabs (Metformin Hcl) .... Take 1 Tablet By Mouth Once A Day 9)  Plavix 75 Mg Tabs (Clopidogrel Bisulfate) .... Take 1 Tablet By Mouth Once A Day 10)  Synthroid 50 Mcg Tabs (Levothyroxine Sodium) .... Take 1 Tablet By Mouth Once A Day 11)  Potassium Chloride Crys Cr 20 Meq  Tbcr (Potassium Chloride Crys Cr) .... Take 1 Tablet By Mouth Once A Day 12)  Aspirin Ec 325 Mg  Tbec (Aspirin) .... Once Daily 13)  Triamcinolone Acetonide 0.5 % Crea (Triamcinolone Acetonide) .... Apply Bid To Affected Area 14)  Osteo Bi-Flex Regular Strength 250-200 Mg Tabs (Glucosamine-Chondroitin) .... As Directed 15)  Amlodipine Besylate 10 Mg Tabs (Amlodipine Besylate) .... Take 1 Tablet By Mouth Once A Day 16)  Carvedilol 12.5 Mg Tabs (Carvedilol) .... One Tablet Every Afternoon 17)  Claritin-D 24 Hour 10-240 Mg Xr24h-Tab (Loratadine-Pseudoephedrine) .... Once Daily As Needed  Allergies (verified): No Known Drug Allergies   Impression & Recommendations:  Problem # 1:  LACERATION OF FINGER  (ICD-883.0) patient comes in for followup. You lacerated finger 2 weeks ago. He is her granddaughter is a Marine scientist. She's been bandaging it. He comes in today for evaluation. He has a eschar on his left index finger. It appears to be healing well without erythema or exudate.  Assessment plan laceration finger healing well. I don't think any further interventions necessary. Tetanus shot is up-to-date.  Complete Medication List: 1)  Simvastatin 20 Mg Tabs (Simvastatin) .... Take one tablet by mouth daily at bedtime 2)  Apriso 0.375 Gm Cp24 (Mesalamine) .... Take four tabs in am daily 3)  Benicar 40 Mg Tabs (Olmesartan medoxomil) .... Take 1 tablet by mouth once a day 4)  Carvedilol 25 Mg Tabs (Carvedilol) .... One tablet every morning 5)  Folic Acid 1 Mg Tabs (Folic acid) .... Take 1 tablet by mouth once a day 6)  Furosemide 40 Mg Tabs (Furosemide) .... Take 1 tablet by mouth once a day 7)  Glyburide 2.5 Mg Tabs (Glyburide) .... Take 1 tablet by mouth once a day 8)  Metformin Hcl 500 Mg Tabs (Metformin hcl) .... Take 1 tablet by mouth once a day 9)  Plavix 75 Mg Tabs (Clopidogrel bisulfate) .... Take 1 tablet by mouth once a day 10)  Synthroid 50 Mcg Tabs (Levothyroxine sodium) .... Take 1 tablet by mouth once a day  11)  Potassium Chloride Crys Cr 20 Meq Tbcr (Potassium chloride crys cr) .... Take 1 tablet by mouth once a day 12)  Aspirin Ec 325 Mg Tbec (Aspirin) .... Once daily 13)  Triamcinolone Acetonide 0.5 % Crea (Triamcinolone acetonide) .... Apply bid to affected area 14)  Osteo Bi-flex Regular Strength 250-200 Mg Tabs (Glucosamine-chondroitin) .... As directed 15)  Amlodipine Besylate 10 Mg Tabs (Amlodipine besylate) .... Take 1 tablet by mouth once a day 16)  Carvedilol 12.5 Mg Tabs (Carvedilol) .... One tablet every afternoon 17)  Claritin-d 24 Hour 10-240 Mg Xr24h-tab (Loratadine-pseudoephedrine) .... Once daily as needed   Orders Added: 1)  Est. Patient Level II [59935]

## 2010-11-12 ENCOUNTER — Encounter (HOSPITAL_COMMUNITY): Payer: BC Managed Care – PPO | Attending: Cardiovascular Disease

## 2010-11-12 DIAGNOSIS — I10 Essential (primary) hypertension: Secondary | ICD-10-CM | POA: Insufficient documentation

## 2010-11-12 DIAGNOSIS — E669 Obesity, unspecified: Secondary | ICD-10-CM | POA: Insufficient documentation

## 2010-11-12 DIAGNOSIS — Z9861 Coronary angioplasty status: Secondary | ICD-10-CM | POA: Insufficient documentation

## 2010-11-12 DIAGNOSIS — I252 Old myocardial infarction: Secondary | ICD-10-CM | POA: Insufficient documentation

## 2010-11-12 DIAGNOSIS — E039 Hypothyroidism, unspecified: Secondary | ICD-10-CM | POA: Insufficient documentation

## 2010-11-12 DIAGNOSIS — E785 Hyperlipidemia, unspecified: Secondary | ICD-10-CM | POA: Insufficient documentation

## 2010-11-12 DIAGNOSIS — I2510 Atherosclerotic heart disease of native coronary artery without angina pectoris: Secondary | ICD-10-CM | POA: Insufficient documentation

## 2010-11-12 DIAGNOSIS — E119 Type 2 diabetes mellitus without complications: Secondary | ICD-10-CM | POA: Insufficient documentation

## 2010-11-12 DIAGNOSIS — Z5189 Encounter for other specified aftercare: Secondary | ICD-10-CM | POA: Insufficient documentation

## 2010-11-15 ENCOUNTER — Encounter (HOSPITAL_COMMUNITY): Payer: BC Managed Care – PPO

## 2010-11-17 ENCOUNTER — Encounter (HOSPITAL_COMMUNITY): Payer: BC Managed Care – PPO

## 2010-11-19 ENCOUNTER — Encounter (HOSPITAL_COMMUNITY): Payer: BC Managed Care – PPO

## 2010-11-22 ENCOUNTER — Encounter (HOSPITAL_COMMUNITY): Payer: BC Managed Care – PPO

## 2010-11-23 ENCOUNTER — Encounter: Payer: Self-pay | Admitting: Gastroenterology

## 2010-11-23 ENCOUNTER — Ambulatory Visit (INDEPENDENT_AMBULATORY_CARE_PROVIDER_SITE_OTHER): Payer: BC Managed Care – PPO | Admitting: Gastroenterology

## 2010-11-23 ENCOUNTER — Encounter (INDEPENDENT_AMBULATORY_CARE_PROVIDER_SITE_OTHER): Payer: Self-pay | Admitting: *Deleted

## 2010-11-23 ENCOUNTER — Other Ambulatory Visit: Payer: Self-pay | Admitting: Gastroenterology

## 2010-11-23 ENCOUNTER — Other Ambulatory Visit: Payer: BC Managed Care – PPO

## 2010-11-23 DIAGNOSIS — K5150 Left sided colitis without complications: Secondary | ICD-10-CM

## 2010-11-23 HISTORY — DX: Left sided colitis without complications: K51.50

## 2010-11-23 LAB — IBC PANEL
Iron: 97 ug/dL (ref 42–165)
Saturation Ratios: 20.8 % (ref 20.0–50.0)
Transferrin: 333.9 mg/dL (ref 212.0–360.0)

## 2010-11-23 LAB — CBC WITH DIFFERENTIAL/PLATELET
Basophils Relative: 0.3 % (ref 0.0–3.0)
Eosinophils Relative: 1.4 % (ref 0.0–5.0)
HCT: 45 % (ref 39.0–52.0)
Lymphs Abs: 1 10*3/uL (ref 0.7–4.0)
MCV: 87.4 fl (ref 78.0–100.0)
Monocytes Absolute: 0.4 10*3/uL (ref 0.1–1.0)
Neutrophils Relative %: 80.2 % — ABNORMAL HIGH (ref 43.0–77.0)
RBC: 5.15 Mil/uL (ref 4.22–5.81)
WBC: 8.1 10*3/uL (ref 4.5–10.5)

## 2010-11-23 LAB — FERRITIN: Ferritin: 25.2 ng/mL (ref 22.0–322.0)

## 2010-11-24 ENCOUNTER — Encounter (HOSPITAL_COMMUNITY): Payer: BC Managed Care – PPO

## 2010-11-26 ENCOUNTER — Encounter (HOSPITAL_COMMUNITY): Payer: BC Managed Care – PPO

## 2010-11-29 ENCOUNTER — Encounter (HOSPITAL_COMMUNITY): Payer: BC Managed Care – PPO

## 2010-11-29 DIAGNOSIS — E538 Deficiency of other specified B group vitamins: Secondary | ICD-10-CM | POA: Insufficient documentation

## 2010-11-29 HISTORY — DX: Deficiency of other specified B group vitamins: E53.8

## 2010-12-01 ENCOUNTER — Encounter (HOSPITAL_COMMUNITY): Payer: BC Managed Care – PPO

## 2010-12-01 NOTE — Letter (Signed)
Summary: Diabetic Instructions  [facility]  [place], [place] [address]   Phone: [phone]  Fax: [phone]    Douglas HENARD 1946-05-26 MRN: [id number]   X   ORAL DIABETIC MEDICATION INSTRUCTIONS   The day before your procedure:   Take your diabetic pill as you do normally  The day of your procedure:   Do not take your diabetic pill    We will check your blood sugar levels during the admission process and again in Recovery before discharging you home

## 2010-12-01 NOTE — Letter (Signed)
Summary: [facility] Instructions  [facility]  [place], [place] [address]   Phone: [phone]  Fax: [phone]       Dong DOOLY    10-08-1946    MRN: [id number]        Procedure Day /Date: Monday 12/06/2010     Arrival Time: 10am     Procedure Time: 11am     Location of Procedure:                    X  [facility] (4th Floor)  [place]   Starting 5 days prior to your procedure 12/01/2010 do not eat nuts, seeds, popcorn, corn, beans, peas,  salads, or any raw vegetables.  Do not take any fiber supplements (e.g. Metamucil, Citrucel, and Benefiber).  THE DAY BEFORE YOUR PROCEDURE         Sunday 12/05/2010  1.  Drink clear liquids the entire day-NO SOLID FOOD  2.  Do not drink anything colored red or purple.  Avoid juices with pulp.  No orange juice.  3.  Drink at least 64 oz. (8 glasses) of fluid/clear liquids during the day to prevent dehydration and help the prep work efficiently.  CLEAR LIQUIDS INCLUDE: Water Jello Ice Popsicles Tea (sugar ok, no milk/cream) Powdered fruit flavored drinks Coffee (sugar ok, no milk/cream) Gatorade Juice: apple, white grape, white cranberry  Lemonade Clear bullion, consomm, broth Carbonated beverages (any kind) Strained chicken noodle soup Hard Candy                             4.  In the morning, mix first dose of MoviPrep solution:    Empty 1 Pouch A and 1 Pouch B into the disposable container    Add lukewarm drinking water to the top line of the container. Mix to dissolve    Refrigerate (mixed solution should be used within 24 hrs)  5.  Begin drinking the prep at 5:00 p.m. The MoviPrep container is divided by 4 marks.   Every 15 minutes drink the solution down to the next mark (approximately 8 oz) until the full liter is complete.   6.  Follow completed prep with 16 oz of clear liquid of your choice (Nothing red or purple).  Continue to drink clear  liquids until bedtime.  7.  Before going to bed, mix second dose of MoviPrep solution:    Empty 1 Pouch A and 1 Pouch B into the disposable container    Add lukewarm drinking water to the top line of the container. Mix to dissolve    Refrigerate  THE DAY OF YOUR PROCEDURE      Monday 12/06/2010  Beginning at 6:00am (5 hours before procedure):         1. Every 15 minutes, drink the solution down to the next mark (approx 8 oz) until the full liter is complete.  2. Follow completed prep with 16 oz. of clear liquid of your choice.    3. You may drink clear liquids until 9:00am (2 HOURS BEFORE PROCEDURE).   MEDICATION INSTRUCTIONS  Unless otherwise instructed, you should take regular prescription medications with a small sip of water   as early as possible the morning of your procedure.  Diabetic patients - see separate instructions.  Continue your Plavix per Dr. Sharlett Iles.            OTHER INSTRUCTIONS  You will  need a responsible adult at least 65 years of age to accompany you and drive you home.   This person must remain in the waiting room during your procedure.  Wear loose fitting clothing that is easily removed.  Leave jewelry and other valuables at home.  However, you may wish to bring a book to read or  an iPod/MP3 player to listen to music as you wait for your procedure to start.  Remove all body piercing jewelry and leave at home.  Total time from sign-in until discharge is approximately 2-3 hours.  You should go home directly after your procedure and rest.  You can resume normal activities the  day after your procedure.  The day of your procedure you should not:   Drive   Make legal decisions   Operate machinery   Drink alcohol   Return to work  You will receive specific instructions about eating, activities and medications before you leave.    The above instructions have been reviewed and explained to me by   _______________________    I  fully understand and can verbalize these instructions _____________________________ Date _________

## 2010-12-01 NOTE — Assessment & Plan Note (Addendum)
Summary: Folic acid refill    History of Present Illness Visit Type: Follow-up Visit Primary GI MD: Verl Blalock MD Primary Provider: Phoebe Sharps, MD Requesting Provider: n/a Chief Complaint: Folic acid refills and discuss next colonoscopy History of Present Illness:   65 year old Caucasian male that has chronic ulcerative colitis in remission on Apriso 0.375 g 4 tablets a day. He has adult onset diabetes under good control, hypertensive cardiovascular disease, coronary artery stent, and is on Plavix and aspirin. He denies active GI complaints this time. He has regular bowel movements without abdominal pain, melena, or hematochezia. He denies upper gastrointestinal or hepatobiliary complaints   GI Review of Systems      Denies abdominal pain, acid reflux, belching, bloating, chest pain, dysphagia with liquids, dysphagia with solids, heartburn, loss of appetite, nausea, vomiting, vomiting blood, weight loss, and  weight gain.        Denies anal fissure, black tarry stools, change in bowel habit, constipation, diarrhea, diverticulosis, fecal incontinence, heme positive stool, hemorrhoids, irritable bowel syndrome, jaundice, light color stool, liver problems, rectal bleeding, and  rectal pain.    Current Medications (verified): 1)  Simvastatin 20 Mg Tabs (Simvastatin) .... Take One Tablet By Mouth Daily At Bedtime 2)  Apriso 0.375 Gm  Cp24 (Mesalamine) .... Take Four Tabs in Am Daily 3)  Benicar 40 Mg Tabs (Olmesartan Medoxomil) .... Take 1 Tablet By Mouth Once A Day 4)  Carvedilol 25 Mg Tabs (Carvedilol) .... One Tablet Every Morning 5)  Folic Acid 1 Mg Tabs (Folic Acid) .... Take 1 Tablet By Mouth Once A Day...must Have Office Vist 6)  Furosemide 40 Mg Tabs (Furosemide) .... Take 1 Tablet By Mouth Once A Day 7)  Glyburide 2.5 Mg Tabs (Glyburide) .... Take 1 Tablet By Mouth Once A Day 8)  Metformin Hcl 500 Mg Tabs (Metformin Hcl) .... Take 1 Tablet By Mouth Once A Day 9)  Plavix 75  Mg Tabs (Clopidogrel Bisulfate) .... Take 1 Tablet By Mouth Once A Day 10)  Synthroid 50 Mcg Tabs (Levothyroxine Sodium) .... Take 1 Tablet By Mouth Once A Day 11)  Potassium Chloride Crys Cr 20 Meq  Tbcr (Potassium Chloride Crys Cr) .... Take 1 Tablet By Mouth Once A Day 12)  Aspirin Ec 325 Mg  Tbec (Aspirin) .... Once Daily 13)  Triamcinolone Acetonide 0.5 % Crea (Triamcinolone Acetonide) .... Apply Bid To Affected Area 14)  Osteo Bi-Flex Regular Strength 250-200 Mg Tabs (Glucosamine-Chondroitin) .... As Directed 15)  Amlodipine Besylate 10 Mg Tabs (Amlodipine Besylate) .... Take 1 Tablet By Mouth Once A Day 16)  Carvedilol 12.5 Mg Tabs (Carvedilol) .... One Tablet Every Afternoon 17)  Claritin-D 24 Hour 10-240 Mg Xr24h-Tab (Loratadine-Pseudoephedrine) .... Once Daily As Needed  Allergies (verified): No Known Drug Allergies  Past History:  Past medical, surgical, family and social histories (including risk factors) reviewed for relevance to current acute and chronic problems.  Past Medical History: Reviewed history from 04/12/2007 and no changes required. Coronary artery disease  01/04 Hypertension Hypothyroidism Myocardial infarction, hx of, 2008   ICM cath 35-40% inflammatory bowel OSA dyslipidemia increased glucose Diabetes mellitus, type II Hyperlipidemia  Past Surgical History: stent 01/04 stent LAD 2008 Tonsillectomy  Family History: Reviewed history from 02/19/2008 and no changes required. Family History of Stomach Cancer:aunt and grandmother paternal  Social History: Reviewed history from 07/09/2009 and no changes required. Married Regular exercise-no Occupation:--retired.  Patient is a former smoker.  Alcohol Use - no Daily Caffeine Use His granddaughter ms Ovid Curd is  an Therapist, sports on 2000 at [employer]  The patient denies allergy/sinus, anemia, anxiety-new, arthritis/joint pain, back pain, blood in urine, breast changes/lumps, change in vision,  confusion, cough, coughing up blood, depression-new, fainting, fatigue, fever, headaches-new, hearing problems, heart murmur, heart rhythm changes, itching, menstrual pain, muscle pains/cramps, night sweats, nosebleeds, pregnancy symptoms, shortness of breath, skin rash, sleeping problems, sore throat, swelling of feet/legs, swollen lymph glands, thirst - excessive , urination - excessive , urination changes/pain, urine leakage, vision changes, and voice change.    Vital Signs:  Patient profile:   65 year old male Height:      70 inches Weight:      301.13 pounds BMI:     43.36 Pulse rate:   64 / minute Pulse rhythm:   regular BP sitting:   112 / 70  (left arm) Cuff size:   large  Vitals Entered By: June McMurray Oilton Deborra Medina) (November 23, 2010 2:04 PM)  Physical Exam  General:  Well developed, well nourished, no acute distress.obese.  obese.   Head:  Normocephalic and atraumatic. Eyes:  PERRLA, no icterus.exam deferred to patient's ophthalmologist.  exam deferred to patient's ophthalmologist.   Lungs:  Clear throughout to auscultation. Heart:  Regular rate and rhythm; no murmurs, rubs,  or bruits. Abdomen:  Soft, nontender and nondistended. No masses, hepatosplenomegaly or hernias noted. Normal bowel sounds.obese.  obese.   Rectal:  deferred until time of colonoscopy.  deferred until time of colonoscopy.   Psych:  Alert and cooperative. Normal mood and affect.   Impression & Recommendations:  Problem # 1:  ULCERATIVE COLITIS, LEFT SIDED (ICD-556.5) Assessment Improved Continue current medications with outpatient colonoscopy at his convenience. CBC and anemia profile ordered for review. He is in remission at this time and colonoscopy scheduled for dysplasia biopsies. He can discontinue his folic acid if his anemia profile is okay Orders: Colonoscopy (Colon) TLB-CBC Platelet - w/Differential (85025-CBCD) TLB-B12, Serum-Total ONLY (70488-Q91) TLB-Ferritin (69450-TUU) TLB-Folic Acid  (Folate) (82800-LKJ) TLB-IBC Pnl (Iron/FE;Transferrin) (83550-IBC)  Problem # 2:  OBESITY (ICD-278.00) Assessment: Deteriorated Not a candidate for bariatric surgery.  Patient Instructions: 1)  Copy sent to : Phoebe Sharps, MD 2)  Please go to the basement today for your labs.  3)  Your prescription(s) have been sent to you pharmacy.  4)  Your procedure has been scheduled for 12/06/2010, please follow the seperate instructions.  5)  [facility] Patient Information Guide given to patient.  6)  Colonoscopy and Flexible Sigmoidoscopy brochure given.  7)  The medication list was reviewed and reconciled.  All changed / newly prescribed medications were explained.  A complete medication list was provided to the patient / caregiver. Prescriptions: MOVIPREP 100 GM  SOLR (PEG-KCL-NACL-NASULF-NA ASC-C) As per prep instructions.  #1 x 0   Entered by:   Bernita Buffy CMA ([place])   Authorized by:   Sable Feil MD [facility]   Signed by:   Sable Feil MD [facility] on 11/23/2010   Method used:   Electronically to        [facility] (retail)       [address]       [place], [place]  [address]       Ph: [phone] or [phone]       Fax: [phone]   RxID:   [id number]

## 2010-12-03 ENCOUNTER — Encounter (HOSPITAL_COMMUNITY): Payer: BC Managed Care – PPO

## 2010-12-03 ENCOUNTER — Encounter: Payer: Self-pay | Admitting: Internal Medicine

## 2010-12-03 ENCOUNTER — Ambulatory Visit (INDEPENDENT_AMBULATORY_CARE_PROVIDER_SITE_OTHER): Payer: BC Managed Care – PPO | Admitting: Internal Medicine

## 2010-12-03 DIAGNOSIS — E538 Deficiency of other specified B group vitamins: Secondary | ICD-10-CM

## 2010-12-03 MED ORDER — CYANOCOBALAMIN 1000 MCG/ML IJ SOLN
1000.0000 ug | INTRAMUSCULAR | Status: DC
  Administered 2010-12-03 – 2012-05-30 (×6): 1000 ug via INTRAMUSCULAR

## 2010-12-06 ENCOUNTER — Encounter (HOSPITAL_COMMUNITY): Payer: BC Managed Care – PPO

## 2010-12-06 ENCOUNTER — Other Ambulatory Visit: Payer: Self-pay | Admitting: Gastroenterology

## 2010-12-06 ENCOUNTER — Other Ambulatory Visit (AMBULATORY_SURGERY_CENTER): Payer: BC Managed Care – PPO | Admitting: Gastroenterology

## 2010-12-06 DIAGNOSIS — K5289 Other specified noninfective gastroenteritis and colitis: Secondary | ICD-10-CM

## 2010-12-06 DIAGNOSIS — Z1211 Encounter for screening for malignant neoplasm of colon: Secondary | ICD-10-CM

## 2010-12-06 DIAGNOSIS — K5100 Ulcerative (chronic) pancolitis without complications: Secondary | ICD-10-CM

## 2010-12-06 LAB — HM COLONOSCOPY

## 2010-12-06 LAB — GLUCOSE, CAPILLARY: Glucose-Capillary: 130 mg/dL — ABNORMAL HIGH (ref 70–99)

## 2010-12-08 ENCOUNTER — Encounter (INDEPENDENT_AMBULATORY_CARE_PROVIDER_SITE_OTHER): Payer: BC Managed Care – PPO | Admitting: Cardiovascular Disease

## 2010-12-08 ENCOUNTER — Encounter (HOSPITAL_COMMUNITY): Payer: BC Managed Care – PPO

## 2010-12-08 ENCOUNTER — Telehealth: Payer: Self-pay | Admitting: Cardiovascular Disease

## 2010-12-08 ENCOUNTER — Encounter: Payer: Self-pay | Admitting: Cardiovascular Disease

## 2010-12-08 DIAGNOSIS — I2510 Atherosclerotic heart disease of native coronary artery without angina pectoris: Secondary | ICD-10-CM

## 2010-12-08 DIAGNOSIS — I4891 Unspecified atrial fibrillation: Secondary | ICD-10-CM

## 2010-12-10 ENCOUNTER — Encounter (HOSPITAL_COMMUNITY): Payer: Self-pay | Attending: Cardiovascular Disease

## 2010-12-10 ENCOUNTER — Encounter: Payer: Self-pay | Admitting: Gastroenterology

## 2010-12-10 DIAGNOSIS — I10 Essential (primary) hypertension: Secondary | ICD-10-CM | POA: Insufficient documentation

## 2010-12-10 DIAGNOSIS — E669 Obesity, unspecified: Secondary | ICD-10-CM | POA: Insufficient documentation

## 2010-12-10 DIAGNOSIS — Z5189 Encounter for other specified aftercare: Secondary | ICD-10-CM | POA: Insufficient documentation

## 2010-12-10 DIAGNOSIS — Z9861 Coronary angioplasty status: Secondary | ICD-10-CM | POA: Insufficient documentation

## 2010-12-10 DIAGNOSIS — I2510 Atherosclerotic heart disease of native coronary artery without angina pectoris: Secondary | ICD-10-CM | POA: Insufficient documentation

## 2010-12-10 DIAGNOSIS — I252 Old myocardial infarction: Secondary | ICD-10-CM | POA: Insufficient documentation

## 2010-12-10 DIAGNOSIS — E039 Hypothyroidism, unspecified: Secondary | ICD-10-CM | POA: Insufficient documentation

## 2010-12-10 DIAGNOSIS — E785 Hyperlipidemia, unspecified: Secondary | ICD-10-CM | POA: Insufficient documentation

## 2010-12-10 DIAGNOSIS — E119 Type 2 diabetes mellitus without complications: Secondary | ICD-10-CM | POA: Insufficient documentation

## 2010-12-13 ENCOUNTER — Encounter (HOSPITAL_COMMUNITY): Payer: Self-pay

## 2010-12-15 ENCOUNTER — Encounter (HOSPITAL_COMMUNITY): Payer: Self-pay

## 2010-12-16 NOTE — Letter (Signed)
Summary: Patient Notice- Colon Biospy Results  [facility]  [address] [place], [place] [address]   Phone: [phone]  Fax: [phone]        December 10, 2010 MRN: [id number]    [place], [place]  [address]    Dear [name],  I am pleased to inform you that the biopsies taken during your recent colonoscopy did not show any evidence of cancer upon pathologic examination.  Additional information/recommendations:  __No further action is needed at this time.  Please follow-up with      your primary care physician for your other healthcare needs.  __Please call [phone] to schedule a return visit to review      your condition.  _X_Continue with the treatment plan as outlined on the day of your      exam.  __You should have a repeat colonoscopy examination for this problem           in 5_ years.  Please call us if you are having persistent problems or have questions about your condition that have not been fully answered at this time.  Sincerely,  Sable Feil MD [facility]   This letter has been electronically signed by your physician.  Appended Document: Patient Notice- Colon Biospy Results letter mailed

## 2010-12-16 NOTE — Procedures (Signed)
Summary: Colonoscopy  Patient: [name] Note: All result statuses are Final unless otherwise noted.  Tests: (1) Colonoscopy (COL)   COL Colonoscopy           [facility]     [place],   [address]           COLONOSCOPY PROCEDURE REPORT           PATIENT:  Andrew, [name]  MR#:  [id number]     BIRTHDATE:  1946/05/06, 64 yrs. old  GENDER:  male     ENDOSCOPIST:  Loralee Pacas. Sharlett Iles, MD, [facility]     REF. BY:  Phoebe Sharps, M.D.     PROCEDURE DATE:  12/06/2010     PROCEDURE:  Colonoscopy with biopsy     ASA CLASS:  Class II     INDICATIONS:  DYSPLASIA SCREEN FOR CHRONIC ULCERATIVE COLITIS.     MEDICATIONS:   Fentanyl 75 mcg IV, Versed 10 mg           DESCRIPTION OF PROCEDURE:   After the risks benefits and     alternatives of the procedure were thoroughly explained, informed     consent was obtained.  Digital rectal exam was performed and     revealed no abnormalities.   The [id number] endoscope was     introduced through the anus and advanced to the cecum, which was     identified by both the appendix and ileocecal valve, without     limitations.  The quality of the prep was excellent, using     MoviPrep.  The instrument was then slowly withdrawn as the colon     was fully examined.     <<PROCEDUREIMAGES>>           FINDINGS:  There were mucosal changes consistent with universal     ulcerative colitis. SCATTERED RED AREAS,BUT NO EROSIONS OR POLYPS     OR BLEEDING.RANDOM BIOPSIES DONE EVERY 10 CM.  no active bleeding     or blood in c.  No polyps or cancers were seen.   Retroflexed     views in the rectum revealed no abnormalities.    The scope was     then withdrawn from the patient and the procedure completed.           COMPLICATIONS:  None     ENDOSCOPIC IMPRESSION:     1) Colitis- universal UC     2) No active bleeding or blood in c     3) No polyps or cancers     COLITIS IN REMISSION ON PO AMINOSALICYLATES.  RECOMMENDATIONS:     1) Await pathology results     2) Repeat Colonoscopy in 5 years.     CONTINUE CURRENT MEDS.ALSO B12 SHOTS PLANNED.     REPEAT EXAM:  No           ______________________________     Loralee Pacas. Sharlett Iles, MD, [facility]           CC:           n.     eSIGNED:   Loralee Pacas. Mahaila Tischer at 12/06/2010 11:58 AM           Andi Devon, [phone]  Note: An exclamation mark (!) indicates a result that was not dispersed into the flowsheet. Document Creation Date: 12/06/2010 11:58 AM _______________________________________________________________________  (1) Order result status: Final Collection or observation date-time: 12/06/2010 11:50  Requested date-time:  Receipt date-time:  Reported date-time:  Referring Physician:   Ordering Physician: Verl Blalock [phone]) Specimen Source:  Source: Tawanna Cooler Order Number: [id number] Lab site:   Appended Document: Colonoscopy     Procedures Next Due Date:    Colonoscopy: 12/2015

## 2010-12-16 NOTE — Progress Notes (Signed)
Summary: Pt having new onset a-fib   Phone Note Other Incoming   Caller: [facility]/ [phone] Summary of Call: Pt is having new onset A-fib Initial call taken by: Delsa Sale,  December 08, 2010 10:08 AM  Follow-up for Phone Call        I spoke with Coralyn Mark and she said the pt had a Colonoscopy 2 days ago and when he came into cardiac rehab he complained of an irregular pulse.  They did put pt on the monitor and per Coralyn Mark the pt is in AFib 75-95, asymptomatic.  The pt will not exercise today.  The pt is already scheduled to see Dr Burt Knack today at 12:00. The pt will bring strips into the office for review. The pt does not have a history of AFib.  Follow-up by: Theodosia Quay, RN, BSN,  December 08, 2010 10:19 AM

## 2010-12-17 ENCOUNTER — Encounter (HOSPITAL_COMMUNITY): Payer: Self-pay

## 2010-12-18 ENCOUNTER — Encounter: Payer: Self-pay | Admitting: Cardiovascular Disease

## 2010-12-20 ENCOUNTER — Encounter: Payer: Self-pay | Admitting: Cardiovascular Disease

## 2010-12-20 ENCOUNTER — Other Ambulatory Visit (INDEPENDENT_AMBULATORY_CARE_PROVIDER_SITE_OTHER): Payer: BC Managed Care – PPO

## 2010-12-20 ENCOUNTER — Other Ambulatory Visit: Payer: Self-pay | Admitting: Cardiovascular Disease

## 2010-12-20 ENCOUNTER — Encounter (HOSPITAL_COMMUNITY): Payer: Self-pay

## 2010-12-20 DIAGNOSIS — I4891 Unspecified atrial fibrillation: Secondary | ICD-10-CM

## 2010-12-20 DIAGNOSIS — I2510 Atherosclerotic heart disease of native coronary artery without angina pectoris: Secondary | ICD-10-CM

## 2010-12-20 LAB — CBC WITH DIFFERENTIAL/PLATELET
Basophils Absolute: 0 10*3/uL (ref 0.0–0.1)
Basophils Relative: 0.2 % (ref 0.0–3.0)
HCT: 44.9 % (ref 39.0–52.0)
Hemoglobin: 15.4 g/dL (ref 13.0–17.0)
Lymphs Abs: 1 10*3/uL (ref 0.7–4.0)
Monocytes Relative: 6.1 % (ref 3.0–12.0)
Neutro Abs: 7.1 10*3/uL (ref 1.4–7.7)
RDW: 13.7 % (ref 11.5–14.6)

## 2010-12-22 ENCOUNTER — Encounter (HOSPITAL_COMMUNITY): Payer: Self-pay

## 2010-12-24 ENCOUNTER — Encounter (HOSPITAL_COMMUNITY): Payer: Self-pay

## 2010-12-27 ENCOUNTER — Encounter (HOSPITAL_COMMUNITY): Payer: Self-pay

## 2010-12-28 NOTE — Assessment & Plan Note (Signed)
Summary: ESTAB CARD EVAL/BB PT  Medications Added CARVEDILOL 25 MG TABS (CARVEDILOL) Take 1 tablet by mouth two times a day ASPIRIN 81 MG TBEC (ASPIRIN) Take one tablet by mouth daily OSTEO BI-FLEX REGULAR STRENGTH 250-200 MG TABS (GLUCOSAMINE-CHONDROITIN) once a day PRADAXA 150 MG CAPS (DABIGATRAN ETEXILATE MESYLATE) take one by mouth two times a day      Allergies Added: NKDA  Visit Type:  follow-up-Pt. of Dr. Olevia Perches Referring Provider:  n/a Primary Provider:  Phoebe Sharps, MD  CC:  No cardiac complaints.  History of Present Illness: [name] is 65 years old and return for management of CAD. He has been followed by Dr. Olevia Perches for many years. In 2004 he had a non-ST elevation MI treated with a drug-eluting stent to the LAD. 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 He was treated early in his ejection fraction recovered to 55%.  Overall the patient has been doing fairly well. He had a colonoscopy a few days ago and did not have any problems with the procedure. The patient has chronic exertional dyspnea. He denies chest pain or pressure. He denies lightheadedness or syncope.  Current Medications (verified): 1)  Simvastatin 20 Mg Tabs (Simvastatin) .... Take One Tablet By Mouth Daily At Bedtime 2)  Apriso 0.375 Gm  Cp24 (Mesalamine) .... Take Four Tabs in Am Daily 3)  Benicar 40 Mg Tabs (Olmesartan Medoxomil) .... Take 1 Tablet By Mouth Once A Day 4)  Carvedilol 25 Mg Tabs (Carvedilol) .... Take 1 Tablet By Mouth Two Times A Day 5)  Furosemide 40 Mg Tabs (Furosemide) .... Take 1 Tablet By Mouth Once A Day 6)  Glyburide 2.5 Mg Tabs (Glyburide) .... Take 1 Tablet By Mouth Once A Day 7)  Metformin Hcl 500 Mg Tabs (Metformin Hcl) .... Take 1 Tablet By Mouth Once A Day 8)  Plavix 75 Mg Tabs (Clopidogrel Bisulfate) .... Take 1 Tablet By Mouth Once A Day 9)  Synthroid 50 Mcg Tabs (Levothyroxine Sodium) .... Take 1 Tablet By Mouth Once A Day 10)   Potassium Chloride Crys Cr 20 Meq  Tbcr (Potassium Chloride Crys Cr) .... Take 1 Tablet By Mouth Once A Day 11)  Aspirin Ec 325 Mg  Tbec (Aspirin) .... Once Daily 12)  Triamcinolone Acetonide 0.5 % Crea (Triamcinolone Acetonide) .... Apply Bid To Affected Area 13)  Osteo Bi-Flex Regular Strength 250-200 Mg Tabs (Glucosamine-Chondroitin) .... Once A Day 14)  Amlodipine Besylate 10 Mg Tabs (Amlodipine Besylate) .... Take 1 Tablet By Mouth Once A Day 15)  Claritin-D 24 Hour 10-240 Mg Xr24h-Tab (Loratadine-Pseudoephedrine) .... Once Daily As Needed 16)  Cyanocobalamin 1000 Mcg/ml Soln (Cyanocobalamin) .... Inject One Ml Im Once A Week, and Then Inject One Ml Im Once A Month  Allergies (verified): No Known Drug Allergies  Past History:  Past medical history reviewed for relevance to current acute and chronic problems.  Past Medical History: Reviewed history from 12/07/2010 and no changes required. Coronary artery disease  01/04 Hypertension Obesity Hypothyroidism Myocardial infarction, hx of, 2008   ICM cath 35-40% inflammatory bowel OSA dyslipidemia increased glucose Diabetes mellitus, type II Hyperlipidemia  Review of Systems       Negative except as per HPI   Vital Signs:  Patient profile:   65 year old male Height:      70 inches Weight:      299 pounds BMI:     43.06 Pulse rate:   84 / minute Pulse rhythm:  irregular Resp:     18 per minute BP sitting:   118 / 80  (left arm) Cuff size:   large  Vitals Entered By: Sidney Ace (December 08, 2010 11:16 AM)  Physical Exam  General:  Pt is alert and oriented, obese male in no acute distress. HEENT: normal Neck: normal carotid upstrokes without bruits, JVP normal Lungs: CTA CV: RRR without murmur or gallop Abd: soft, NT, positive BS, no bruit, no organomegaly Ext: trace bilateral pretibial edema. peripheral pulses 2+ and equal Skin: warm and dry without rash    Echocardiogram  Procedure date:   08/26/2010  Findings:      Study Conclusions            - Left ventricle: The cavity size was normal. There was moderate       concentric hypertrophy. Systolic function was mildly to moderately       reduced. The estimated ejection fraction was in the range of 40%       to 45%. There is hypokinesis of the anteroseptal myocardium.       Doppler parameters are consistent with abnormal left ventricular       relaxation (grade 1 diastolic dysfunction).     - Left atrium: The atrium was mildly dilated.  EKG  Procedure date:  12/08/2010  Findings:      Atrial fibrillation 86 bpm, LVH with repolarization abnormality.  Exercise Stress Test  Procedure date:  08/26/2010  Findings:      Exercise Tolerance Test Results:    Ordering MD:        Tilda Burrow    Interpreting MD:     Tilda Burrow    Treadmill:       1    Stress Modality:     exercise-treadmill    Maximum BP:        213 / 74    MPHR (bpm):        156    85% MPHR (bpm):     133    MHR obtained (bpm):        129    Total Exercise Time       (min:sec):       6:00    Workload in METS:     7.0    Borg Scale:       15    ST Segment analysis:       At Rest:       normal ST segments-no evidence of significant ST depression       Max. ST segment deviation          (during exercise or rest):   0 mm    Arrhythmia:             no    Angina during ETT:     absent (0)  Cardiovascular Risk Assessment/Plan:       The patient's hypertensive risk group is category C: Target organ damage and/or diabetes.  Today's blood pressure is 142/74.  His blood pressure goal is < 130/80.  Exercise Tolerance Test Assessment:    Quality of ETT:   diagnostic    ETT Interpretation:   normal-no evidence of ischemia by ST analysis.  He did have slight lateral T-wave inversion post exercise but no ischemic ST depression.    Comments:     Limited by SOB.  Negative test with somewhat limited exercise tolerance.    Recommendations:   Reassurance.   Continued wt loss.  Signed by Fatima Sanger, MD, [facility] on 08/26/2010 at 5:27 PM  ________________________________________________________________________ [name] did fairly well on his stress test. His exercise tolerance is somewhat limited but he had no chest pain and no ST depression. This is interpreted as a negative test. His echocardiogram showed ejection fraction of 45% with apical hypokinesis. This is down from his previous reading slightly of 50-55%.  We will plan to continue his current therapy. He is to continue to work on weight reduction and is to continue in the rehabilitation program. We'll arrange for him to see [name] back in followup in one year. His granddaughter is a Marine scientist and she had helped in the recommendation of [name] for followup.    Impression & Recommendations:  Problem # 1:  ATRIAL FIBRILLATION (ICD-427.31) The patient has new onset atrial fibrillation. His heart rate is controlled. He has mild LV dysfunction by echocardiogram. He has significant thromboembolic risk and will require systemic anticoagulation. His risk factors include diabetes, LV dysfunction, and hypertension. We discussed the possibility of warfarin versus pradaxa and the advantages/disadvantages of each drug. He chooses a trial of pradaxa.   Complicating matters is the fact that he requires ongoing dual antiplatelet therapy with aspirin and Plavix. This is indicated because of history of stent thrombosis.  Will see the patient back in 3 weeks if he remains in atrial fibrillation we'll consider cardioversion at that point.  The following medications were removed from the medication list:    Carvedilol 12.5 Mg Tabs (Carvedilol) ..... One tablet every afternoon His updated medication list for this problem includes:    Carvedilol 25 Mg Tabs (Carvedilol) .Marland Kitchen... Take 1 tablet by mouth two times a day    Plavix 75 Mg Tabs (Clopidogrel bisulfate) .Marland Kitchen... Take 1 tablet by mouth once a day     Aspirin 81 Mg Tbec (Aspirin) .Marland Kitchen... Take one tablet by mouth daily  Problem # 2:  CORONARY ARTERY DISEASE (ICD-414.00) The patient is stable and has no angina at present. We'll continue his current medical program. The following medications were removed from the medication list:    Carvedilol 12.5 Mg Tabs (Carvedilol) ..... One tablet every afternoon His updated medication list for this problem includes:    Carvedilol 25 Mg Tabs (Carvedilol) .Marland Kitchen... Take 1 tablet by mouth two times a day    Plavix 75 Mg Tabs (Clopidogrel bisulfate) .Marland Kitchen... Take 1 tablet by mouth once a day    Aspirin 81 Mg Tbec (Aspirin) .Marland Kitchen... Take one tablet by mouth daily    Amlodipine Besylate 10 Mg Tabs (Amlodipine besylate) .Marland Kitchen... Take 1 tablet by mouth once a day  Problem # 3:  HYPERLIPIDEMIA (ICD-272.4) Assessment: Comment Only  His updated medication list for this problem includes:    Simvastatin 20 Mg Tabs (Simvastatin) .Marland Kitchen... Take one tablet by mouth daily at bedtime  CHOL: 71 (09/16/2010)   LDL: 22 (09/16/2010)   HDL: 18.30 (09/16/2010)   TG: 156.0 (09/16/2010)  Patient Instructions: 1)  Your physician recommends that you schedule a follow-up appointment in: 3 WEEKS 2)  Your physician recommends that you return for lab work in: [place] (CBC 427.31, 414.01) 3)  Your physician has recommended you make the following change in your medication: DECREASE Aspirin 15m once a day, START Pradaxa 1517mone by mouth two times a day  Prescriptions: PRADAXA 150 MG CAPS (DABIGATRAN ETEXILATE MESYLATE) take one by mouth two times a day  #60 x 6   Entered by:   LaTheodosia QuayRN,  BSN   Authorized by:   Neale Burly, MD   Signed by:   Theodosia Quay, RN, BSN on 12/08/2010   Method used:   Electronically to        [facility] (retail)       [address]       [place], [place]  [address]       Ph: [phone] or [phone]       Fax: [phone]   RxID:   [id number]

## 2010-12-29 ENCOUNTER — Encounter (HOSPITAL_COMMUNITY): Payer: Self-pay

## 2010-12-30 ENCOUNTER — Ambulatory Visit (INDEPENDENT_AMBULATORY_CARE_PROVIDER_SITE_OTHER): Payer: BC Managed Care – PPO | Admitting: Cardiovascular Disease

## 2010-12-30 ENCOUNTER — Encounter: Payer: Self-pay | Admitting: Cardiovascular Disease

## 2010-12-30 VITALS — BP 110/88 | HR 81 | Resp 19 | Wt 301.0 lb

## 2010-12-30 DIAGNOSIS — I4891 Unspecified atrial fibrillation: Secondary | ICD-10-CM

## 2010-12-30 DIAGNOSIS — I2510 Atherosclerotic heart disease of native coronary artery without angina pectoris: Secondary | ICD-10-CM

## 2010-12-30 DIAGNOSIS — I10 Essential (primary) hypertension: Secondary | ICD-10-CM

## 2010-12-30 LAB — BASIC METABOLIC PANEL
Calcium: 8.7 mg/dL (ref 8.4–10.5)
GFR: 82.57 mL/min (ref >60.00)
Glucose, Bld: 118 mg/dL — ABNORMAL HIGH (ref 70–99)
Sodium: 136 mEq/L (ref 135–145)

## 2010-12-30 LAB — PROTIME-INR: Prothrombin Time: 12.5 s — ABNORMAL HIGH (ref 10.2–12.4)

## 2010-12-30 LAB — CBC WITH DIFFERENTIAL/PLATELET
Basophils Absolute: 0 10*3/uL (ref 0.0–0.1)
Hemoglobin: 15.6 g/dL (ref 13.0–17.0)
Lymphocytes Relative: 13.5 % (ref 12.0–46.0)
Monocytes Relative: 6.8 % (ref 3.0–12.0)
Neutro Abs: 6.4 10*3/uL (ref 1.4–7.7)
RDW: 13.8 % (ref 11.5–14.6)

## 2010-12-30 NOTE — Assessment & Plan Note (Signed)
Blood pressure remains controlled.

## 2010-12-30 NOTE — Patient Instructions (Addendum)
Your physician has recommended that you have a Cardioversion (DCCV). Electrical Cardioversion uses a jolt of electricity to your heart either through paddles or wired patches attached to your chest. This is a controlled, usually prescheduled, procedure. Defibrillation is done under light anesthesia in the hospital, and you usually go home the day of the procedure. This is done to get your heart back into a normal rhythm. You are not awake for the procedure. Please see the instruction sheet given to you today.  Your physician recommends that you continue on your current medications as directed. Please refer to the Current Medication list given to you today.  Your physician recommends that you schedule a follow-up appointment in: [place] physician recommends that you have lab work today: CBC, BMP, PT/INR (427.31)

## 2010-12-30 NOTE — Assessment & Plan Note (Signed)
This patient with history of stent thrombosis will need to remain on lifelong dual antiplatelet therapy. It's not ideal he is on aspirin, Plavix, and pradaxa, but with a history of stent thrombosis as well as a high CHADS score I don't see any way around triple drug therapy.

## 2010-12-30 NOTE — Progress Notes (Signed)
HPI:  This is a 65 year old gentleman with coronary artery disease and recently diagnosed atrial fibrillation. The patient had acute coronary syndrome back in 2004 and was treated with coronary stenting. He had late stent thrombosis after discontinuation of Plavix and is now maintained on long-term dual antiplatelet therapy. He presented last month with atrial fibrillation following a colonoscopy. His rate was controlled and he was started on pradaxa for anticoagulation.  He presents today for follow up evaluation.  He reports one episode of exertional dyspnea and chest discomfort when he was doing some yard work last week. He thinks he was pushing himself too hard. He has mild chronic dyspnea with exertion but he's had no other anginal symptoms since the single episode last week. He has no other complaints today. He specifically denies orthopnea, PND, or edema. He has no palpitations, lightheadedness, or syncope.  Outpatient Encounter Prescriptions as of 12/30/2010  Medication Sig Dispense Refill  . amLODipine (NORVASC) 10 MG tablet Take 10 mg by mouth daily.        . APRISO 0.375 G 24 hr capsule       . aspirin 81 MG EC tablet Take 81 mg by mouth daily.        . dabigatran (PRADAXA) 150 MG CAPS Take 150 mg by mouth every 12 (twelve) hours.        Marland Kitchen econazole nitrate 1 % cream       . fish oil-omega-3 fatty acids 1000 MG capsule Take 1 g by mouth daily.        . furosemide (LASIX) 40 MG tablet Take 40 mg by mouth daily.       . Glucosamine-Chondroitin (OSTEO BI-FLEX REGULAR STRENGTH) 250-200 MG TABS Take 1 tablet by mouth daily.        Marland Kitchen glyBURIDE (DIABETA) 2.5 MG tablet Take 2.5 mg by mouth daily with breakfast.       . loratadine-pseudoephedrine (CLARITIN-D 24-HOUR) 10-240 MG per 24 hr tablet Take 1 tablet by mouth daily as needed.       Marland Kitchen olmesartan (BENICAR) 40 MG tablet Take 40 mg by mouth daily.        Marland Kitchen PLAVIX 75 MG tablet Take 75 mg by mouth daily.       Marland Kitchen SYNTHROID 50 MCG tablet Take 50  mcg by mouth daily.       . folic acid (FOLVITE) 1 MG tablet       . KLOR-CON M20 20 MEQ tablet Take 20 mEq by mouth daily.       . metFORMIN (GLUCOPHAGE) 500 MG tablet Take 500 mg by mouth daily with breakfast.       . triamcinolone (KENALOG) 0.5 % cream Apply 1 application topically 2 (two) times daily. Apply bid to affected area       . DISCONTD: MOVIPREP 100 G SOLR        Facility-Administered Encounter Medications as of 12/30/2010  Medication Dose Route Frequency Provider Last Rate Last Dose  . cyanocobalamin ((VITAMIN B-12)) injection 1,000 mcg  1,000 mcg Intramuscular Q30 days Chancy Hurter, MD   1,000 mcg at 12/03/10 1221    No Known Allergies  Past Medical History  Diagnosis Date  . CORONARY ARTERY DISEASE 04/12/2007  . DIABETES MELLITUS, TYPE II 04/16/2007  . HYPERLIPIDEMIA 04/16/2007  . HYPERTENSION 04/12/2007  . HYPOTHYROIDISM 04/12/2007  . MYOCARDIAL INFARCTION, HX OF 04/12/2007  . OBESITY 06/16/2009  . SLEEP APNEA 04/12/2007  . ULCERATIVE COLITIS, LEFT SIDED 11/23/2010  . Ischemic cardiomyopathy  cath 35-40%    ROS: Negative except as per HPI  BP 110/88  Pulse 81  Resp 19  Wt 301 lb (136.533 kg)  PHYSICAL EXAM: Pt is alert and oriented, obese male, NAD HEENT: normal Neck: JVP - normal, carotids 2+= without bruits Lungs: CTA bilaterally CV: irregularly irregular without murmur or gallop Abd: soft, NT, obese, Positive BS, no hepatomegaly appreciated Ext: no C/C/E, distal pulses intact and equal Skin: warm/dry no rash  EKG:  Atrial fibrillation, nonspecific ST and T wave changes, heart rate 81 beats per minute  ASSESSMENT AND PLAN:

## 2010-12-30 NOTE — Assessment & Plan Note (Signed)
The patient is tolerating anticoagulation with pradaxa. His rate is controlled, and his total duration of atrial fib is about one month now. With LV dysfunction and obesity, I think he will do better from a symptomatic perspective if we make an attempt to restore sinus rhythm. I reviewed the risks, alternatives, and indications for cardioversion and the patient agrees to proceed.  He will be scheduled to come in to the short stay next week for cardioversion.

## 2010-12-31 ENCOUNTER — Encounter (HOSPITAL_COMMUNITY): Payer: Self-pay

## 2011-01-02 LAB — GLUCOSE, CAPILLARY: Glucose-Capillary: 110 mg/dL — ABNORMAL HIGH (ref 70–99)

## 2011-01-03 ENCOUNTER — Other Ambulatory Visit: Payer: Self-pay | Admitting: *Deleted

## 2011-01-03 ENCOUNTER — Encounter (HOSPITAL_COMMUNITY): Payer: Self-pay

## 2011-01-03 DIAGNOSIS — I10 Essential (primary) hypertension: Secondary | ICD-10-CM

## 2011-01-03 MED ORDER — OLMESARTAN MEDOXOMIL 40 MG PO TABS: 40.0000 mg | ORAL_TABLET | Freq: Every day | ORAL | Status: DC

## 2011-01-04 ENCOUNTER — Ambulatory Visit (HOSPITAL_COMMUNITY): Payer: BC Managed Care – PPO

## 2011-01-04 ENCOUNTER — Ambulatory Visit (HOSPITAL_COMMUNITY)
Admission: RE | Admit: 2011-01-04 | Discharge: 2011-01-04 | Disposition: A | Discharge status: Home / Self Care | Payer: BC Managed Care – PPO | Source: Ambulatory Visit | Attending: Cardiovascular Disease | Admitting: Cardiovascular Disease

## 2011-01-04 DIAGNOSIS — I252 Old myocardial infarction: Secondary | ICD-10-CM | POA: Insufficient documentation

## 2011-01-04 DIAGNOSIS — I2510 Atherosclerotic heart disease of native coronary artery without angina pectoris: Secondary | ICD-10-CM | POA: Insufficient documentation

## 2011-01-04 DIAGNOSIS — I4891 Unspecified atrial fibrillation: Secondary | ICD-10-CM | POA: Insufficient documentation

## 2011-01-04 DIAGNOSIS — Z4901 Encounter for fitting and adjustment of extracorporeal dialysis catheter: Secondary | ICD-10-CM | POA: Insufficient documentation

## 2011-01-04 DIAGNOSIS — I519 Heart disease, unspecified: Secondary | ICD-10-CM | POA: Insufficient documentation

## 2011-01-04 LAB — GLUCOSE, CAPILLARY: Glucose-Capillary: 129 mg/dL — ABNORMAL HIGH (ref 70–99)

## 2011-01-05 ENCOUNTER — Encounter (HOSPITAL_COMMUNITY): Payer: Self-pay

## 2011-01-06 ENCOUNTER — Other Ambulatory Visit (INDEPENDENT_AMBULATORY_CARE_PROVIDER_SITE_OTHER): Payer: BC Managed Care – PPO | Admitting: Internal Medicine

## 2011-01-06 DIAGNOSIS — IMO0001 Reserved for inherently not codable concepts without codable children: Secondary | ICD-10-CM

## 2011-01-06 DIAGNOSIS — T887XXA Unspecified adverse effect of drug or medicament, initial encounter: Secondary | ICD-10-CM

## 2011-01-06 DIAGNOSIS — E785 Hyperlipidemia, unspecified: Secondary | ICD-10-CM

## 2011-01-06 LAB — LIPID PANEL
Cholesterol: 83 mg/dL (ref 0–200)
Total CHOL/HDL Ratio: 4

## 2011-01-06 LAB — BASIC METABOLIC PANEL
BUN: 12 mg/dL (ref 6–23)
CO2: 32 mEq/L (ref 19–32)
Chloride: 105 mEq/L (ref 96–112)
Creatinine, Ser: 0.8 mg/dL (ref 0.4–1.5)

## 2011-01-06 LAB — HEPATIC FUNCTION PANEL
ALT: 19 U/L (ref 0–53)
Bilirubin, Direct: 0.2 mg/dL (ref 0.0–0.3)
Total Protein: 6.5 g/dL (ref 6.0–8.3)

## 2011-01-07 ENCOUNTER — Encounter (HOSPITAL_COMMUNITY): Payer: Self-pay

## 2011-01-10 ENCOUNTER — Encounter (HOSPITAL_COMMUNITY): Payer: Self-pay | Attending: Cardiovascular Disease

## 2011-01-10 DIAGNOSIS — E785 Hyperlipidemia, unspecified: Secondary | ICD-10-CM | POA: Insufficient documentation

## 2011-01-10 DIAGNOSIS — I10 Essential (primary) hypertension: Secondary | ICD-10-CM | POA: Insufficient documentation

## 2011-01-10 DIAGNOSIS — E119 Type 2 diabetes mellitus without complications: Secondary | ICD-10-CM | POA: Insufficient documentation

## 2011-01-10 DIAGNOSIS — Z5189 Encounter for other specified aftercare: Secondary | ICD-10-CM | POA: Insufficient documentation

## 2011-01-10 DIAGNOSIS — E669 Obesity, unspecified: Secondary | ICD-10-CM | POA: Insufficient documentation

## 2011-01-10 DIAGNOSIS — I2510 Atherosclerotic heart disease of native coronary artery without angina pectoris: Secondary | ICD-10-CM | POA: Insufficient documentation

## 2011-01-10 DIAGNOSIS — I252 Old myocardial infarction: Secondary | ICD-10-CM | POA: Insufficient documentation

## 2011-01-10 DIAGNOSIS — Z9861 Coronary angioplasty status: Secondary | ICD-10-CM | POA: Insufficient documentation

## 2011-01-10 DIAGNOSIS — E039 Hypothyroidism, unspecified: Secondary | ICD-10-CM | POA: Insufficient documentation

## 2011-01-10 NOTE — Procedures (Signed)
  [name], [name]               ACCOUNT NO.:  [id number]  MEDICAL RECORD NO.:  [id number]           PATIENT TYPE:  O  LOCATION:  [facility]                         FACILITY:  [facility]  PHYSICIAN:  [name]. Burt Knack, MD  DATE OF BIRTH:  1946/05/27  DATE OF PROCEDURE: DATE OF DISCHARGE:                           CARDIAC CATHETERIZATION   PROCEDURE:  Elective cardioversion.  PROCEDURAL INDICATIONS:  [name] is a 65 year old gentleman with coronary artery disease, prior MI, moderate LV dysfunction, and newly diagnosed atrial fibrillation.  He was treated with Pradaxa for greater than 3 weeks and reported good compliance with his medication.  He was brought in for elective cardioversion.  Risks and indications of the procedure were reviewed in detail with the patient.  Informed consent was obtained.  The patient was given a total of 100 mg of propofol by Anesthesia.  I attempted to cardiovert him initially with 150 joules of biphasic output.  This was unsuccessful. We then did a 200 joules shock and this was also unsuccessful.  The patient remained in atrial fibrillation with the heart rate in the 70s.  PLAN:  We will continue his current medical therapy and treat him with a strategy of rate control and ongoing anticoagulation.     [name]. Burt Knack, MD     MDC/MEDQ  D:  01/04/2011  T:  01/05/2011  Job:  [id number]  Electronically Signed by Sherren Mocha MD on 01/10/2011 01:13:49 PM

## 2011-01-12 ENCOUNTER — Ambulatory Visit (INDEPENDENT_AMBULATORY_CARE_PROVIDER_SITE_OTHER): Payer: BC Managed Care – PPO | Admitting: Cardiovascular Disease

## 2011-01-12 ENCOUNTER — Encounter (HOSPITAL_COMMUNITY): Payer: Self-pay

## 2011-01-12 ENCOUNTER — Encounter: Payer: Self-pay | Admitting: Cardiovascular Disease

## 2011-01-12 VITALS — BP 138/82 | HR 76 | Resp 18 | Ht 69.0 in | Wt 300.0 lb

## 2011-01-12 DIAGNOSIS — I4891 Unspecified atrial fibrillation: Secondary | ICD-10-CM

## 2011-01-12 NOTE — Progress Notes (Signed)
HPI:  This is a 65 year old gentleman presenting for followup of atrial fibrillation. The patient was diagnosed with atrial fib approximately 2 months ago when he presented for routine followup after a colonoscopy. He was anticoagulated and underwent DC cardioversion. He failed cardioversion and has remained in atrial fibrillation. He has actually done very well from a symptomatic standpoint and he currently denies chest pain, dyspnea, edema, palpitations, lightheadedness, orthopnea, or PND. He has no complaints today.  Outpatient Encounter Prescriptions as of 01/12/2011  Medication Sig Dispense Refill  . amLODipine (NORVASC) 10 MG tablet Take 10 mg by mouth daily.        . APRISO 0.375 G 24 hr capsule Take 4 capsules in the morning      . aspirin 81 MG EC tablet Take 81 mg by mouth daily.        . carvedilol (COREG) 12.5 MG tablet Take 12.5 mg by mouth 2 (two) times daily with a meal.       . dabigatran (PRADAXA) 150 MG CAPS Take 150 mg by mouth every 12 (twelve) hours.        . fish oil-omega-3 fatty acids 1000 MG capsule Take 2 g by mouth daily.       . furosemide (LASIX) 40 MG tablet Take 40 mg by mouth daily.       . Glucosamine-Chondroitin (OSTEO BI-FLEX REGULAR STRENGTH) 250-200 MG TABS Take 1 tablet by mouth 2 (two) times daily.       Marland Kitchen glyBURIDE (DIABETA) 2.5 MG tablet Take 2.5 mg by mouth daily with breakfast.       . KLOR-CON M20 20 MEQ tablet Take 20 mEq by mouth daily.       . metFORMIN (GLUCOPHAGE) 500 MG tablet Take 500 mg by mouth daily with breakfast.       . olmesartan (BENICAR) 40 MG tablet Take 1 tablet (40 mg total) by mouth daily.  30 tablet  5  . ONE TOUCH ULTRA TEST test strip       . PLAVIX 75 MG tablet Take 75 mg by mouth daily.       Marland Kitchen SYNTHROID 50 MCG tablet Take 50 mcg by mouth daily.       Marland Kitchen triamcinolone (KENALOG) 0.5 % cream Apply 1 application topically 2 (two) times daily. Apply bid to affected area       . econazole nitrate 1 % cream       . folic acid (FOLVITE)  1 MG tablet       . loratadine-pseudoephedrine (CLARITIN-D 24-HOUR) 10-240 MG per 24 hr tablet Take 1 tablet by mouth daily as needed.       Marland Kitchen DISCONTD: MOVIPREP 100 G SOLR        Facility-Administered Encounter Medications as of 01/12/2011  Medication Dose Route Frequency Provider Last Rate Last Dose  . cyanocobalamin ((VITAMIN B-12)) injection 1,000 mcg  1,000 mcg Intramuscular Q30 days Chancy Hurter, MD   1,000 mcg at 12/03/10 1221    No Known Allergies  Past Medical History  Diagnosis Date  . CORONARY ARTERY DISEASE 04/12/2007  . DIABETES MELLITUS, TYPE II 04/16/2007  . HYPERLIPIDEMIA 04/16/2007  . HYPERTENSION 04/12/2007  . HYPOTHYROIDISM 04/12/2007  . MYOCARDIAL INFARCTION, HX OF 04/12/2007  . OBESITY 06/16/2009  . SLEEP APNEA 04/12/2007  . ULCERATIVE COLITIS, LEFT SIDED 11/23/2010  . Ischemic cardiomyopathy     cath 35-40%  . Atrial fibrillation     initial diagnoses 2012    ROS: Negative except as per HPI  BP 138/82  Pulse 76  Resp 18  Ht 5' 9"  (1.753 m)  Wt 300 lb (136.079 kg)  BMI 44.30 kg/m2  PHYSICAL EXAM: Pt is alert and oriented, very pleasant obese male in NAD HEENT: normal Neck: JVP - normal, carotids 2+= without bruits Lungs: CTA bilaterally CV: irregularly irregular without murmur or gallop Abd: soft, NT, Positive BS, no hepatomegaly Ext: trace edema bilaterally, distal pulses intact and equal Skin: warm/dry no rash  EKG:  Atrial fibrillation with heart rate 72 beats per minute, ST and T wave abnormality consider inferior ischemia or dig effect.  ASSESSMENT AND PLAN:

## 2011-01-12 NOTE — Assessment & Plan Note (Signed)
We discussed further treatment strategies for atrial fibrillation. I think the best thing to do at this point is to treat him with rate control and anticoagulation. The only alternative would be to start him on antiarrhythmic therapy and repeat cardioversion but I think this is not warranted since he is tolerating his atrial fib well. He will need to be maintained long-term on a combination of aspirin 81 mg, Plavix 75 mg, and pradaxa.  While his bleeding risk is significant on all 3 agents, he has strong indications for both aspirin and Plavix since he has a history of stent thrombosis, and also requires anticoagulation because of a CHADS2 score of 3 (HTN, LV dysfunction, diabetes).  He understands this and will report any bleeding events immediately.

## 2011-01-13 ENCOUNTER — Ambulatory Visit (INDEPENDENT_AMBULATORY_CARE_PROVIDER_SITE_OTHER): Payer: BC Managed Care – PPO | Admitting: Internal Medicine

## 2011-01-13 ENCOUNTER — Encounter: Payer: Self-pay | Admitting: Internal Medicine

## 2011-01-13 DIAGNOSIS — I10 Essential (primary) hypertension: Secondary | ICD-10-CM

## 2011-01-13 DIAGNOSIS — I2510 Atherosclerotic heart disease of native coronary artery without angina pectoris: Secondary | ICD-10-CM

## 2011-01-13 DIAGNOSIS — E538 Deficiency of other specified B group vitamins: Secondary | ICD-10-CM

## 2011-01-13 DIAGNOSIS — E119 Type 2 diabetes mellitus without complications: Secondary | ICD-10-CM

## 2011-01-13 DIAGNOSIS — I4891 Unspecified atrial fibrillation: Secondary | ICD-10-CM

## 2011-01-13 NOTE — Progress Notes (Signed)
  Subjective:    Patient ID: [name], male    DOB: [birth year], 65 y.o.   MRN: [id number]  HPI Patient comes in for followup of multiple medical problems including diabetes, hypertension, hyperlipidemia. He has a history of coronary artery disease. He denies any chest pain, shortness of breath, PND. He has however had a diagnosis of atrial fibrillation. This apparently was identified at cardiac rehabilitation. He has since undergone a trial of cardioversion which he tells me was unsuccessful. I reviewed Dr. Antionette Char note from yesterday. The current plan for atrial fibrillation his rate control and anticoagulation. Patient has tolerated Pradaxa to date. No bleeding complications. I have reviewed Pradaxa dosing.  Patient is tolerating other medications without difficulty. Patient does not check a blood sugar at home. Patient is not following a diet. Patient is participating in cardiac rehabilitation.  Past Medical History  Diagnosis Date  . CORONARY ARTERY DISEASE 04/12/2007  . DIABETES MELLITUS, TYPE II 04/16/2007  . HYPERLIPIDEMIA 04/16/2007  . HYPERTENSION 04/12/2007  . HYPOTHYROIDISM 04/12/2007  . MYOCARDIAL INFARCTION, HX OF 04/12/2007  . OBESITY 06/16/2009  . SLEEP APNEA 04/12/2007  . ULCERATIVE COLITIS, LEFT SIDED 11/23/2010  . Ischemic cardiomyopathy     cath 35-40%  . Atrial fibrillation     initial diagnoses 2012   Past Surgical History  Procedure Date  . Coronary stent placement 2008    LAD   . Cardiac catheterization 10/2002    STENT  . Tonsillectomy     reports that he quit smoking about 43 years ago. His smoking use included Cigarettes. He does not have any smokeless tobacco history on file. He reports that he does not drink alcohol. His drug history not on file. family history includes Cancer in his maternal aunt and paternal grandmother; Heart attack in his mother; Heart disease in his father; Hypertension in his brother; and Obesity in his brother. No Known Allergies    Review  of Systems  patient denies chest pain, shortness of breath, orthopnea. Denies lower extremity edema, abdominal pain, change in appetite, change in bowel movements. Patient denies rashes, musculoskeletal complaints. No other specific complaints in a complete review of systems.      Objective:   Physical Exam  well-developed well-nourished male in no acute distress. HEENT exam atraumatic, normocephalic, neck supple without jugular venous distention. Chest clear to auscultation cardiac exam S1-S2 are irregular. Abdominal exam obese with bowel sounds, soft and nontender. Extremities no 1+ edema to mid calf. Neurologic exam is alert with a normal gait.    Assessment & Plan:

## 2011-01-13 NOTE — Assessment & Plan Note (Signed)
Controlled Continue meds

## 2011-01-13 NOTE — Assessment & Plan Note (Signed)
Reviewed recent notes Reviewed afib/cardioversion-not successful afib---decision has been made to treat AFIB with rate control and anticoagulation (pradaxa)

## 2011-01-13 NOTE — Assessment & Plan Note (Signed)
No sxs Continue current meds

## 2011-01-13 NOTE — Assessment & Plan Note (Signed)
Note A1C---he desperately needs to lose weight He voices understanding He is in cardiac rehab

## 2011-01-14 ENCOUNTER — Encounter (HOSPITAL_COMMUNITY): Payer: Self-pay

## 2011-01-17 ENCOUNTER — Encounter (HOSPITAL_COMMUNITY): Payer: Self-pay

## 2011-01-19 ENCOUNTER — Encounter (HOSPITAL_COMMUNITY): Payer: Self-pay

## 2011-01-21 ENCOUNTER — Encounter (HOSPITAL_COMMUNITY): Payer: Self-pay

## 2011-01-24 ENCOUNTER — Encounter (HOSPITAL_COMMUNITY): Payer: Self-pay

## 2011-01-26 ENCOUNTER — Encounter (HOSPITAL_COMMUNITY): Payer: Self-pay

## 2011-01-28 ENCOUNTER — Encounter (HOSPITAL_COMMUNITY): Payer: Self-pay

## 2011-01-31 ENCOUNTER — Encounter (HOSPITAL_COMMUNITY): Payer: Self-pay

## 2011-02-02 ENCOUNTER — Encounter (HOSPITAL_COMMUNITY): Payer: Self-pay

## 2011-02-04 ENCOUNTER — Encounter (HOSPITAL_COMMUNITY): Payer: Self-pay

## 2011-02-07 ENCOUNTER — Encounter (HOSPITAL_COMMUNITY): Payer: Self-pay

## 2011-02-09 ENCOUNTER — Encounter (HOSPITAL_COMMUNITY): Payer: Self-pay | Attending: Cardiovascular Disease

## 2011-02-09 DIAGNOSIS — Z5189 Encounter for other specified aftercare: Secondary | ICD-10-CM | POA: Insufficient documentation

## 2011-02-09 DIAGNOSIS — Z9861 Coronary angioplasty status: Secondary | ICD-10-CM | POA: Insufficient documentation

## 2011-02-09 DIAGNOSIS — E669 Obesity, unspecified: Secondary | ICD-10-CM | POA: Insufficient documentation

## 2011-02-09 DIAGNOSIS — I252 Old myocardial infarction: Secondary | ICD-10-CM | POA: Insufficient documentation

## 2011-02-09 DIAGNOSIS — E785 Hyperlipidemia, unspecified: Secondary | ICD-10-CM | POA: Insufficient documentation

## 2011-02-09 DIAGNOSIS — I10 Essential (primary) hypertension: Secondary | ICD-10-CM | POA: Insufficient documentation

## 2011-02-09 DIAGNOSIS — E119 Type 2 diabetes mellitus without complications: Secondary | ICD-10-CM | POA: Insufficient documentation

## 2011-02-09 DIAGNOSIS — E039 Hypothyroidism, unspecified: Secondary | ICD-10-CM | POA: Insufficient documentation

## 2011-02-09 DIAGNOSIS — I2510 Atherosclerotic heart disease of native coronary artery without angina pectoris: Secondary | ICD-10-CM | POA: Insufficient documentation

## 2011-02-11 ENCOUNTER — Encounter (HOSPITAL_COMMUNITY): Payer: Self-pay

## 2011-02-14 ENCOUNTER — Encounter (HOSPITAL_COMMUNITY): Payer: Self-pay

## 2011-02-16 ENCOUNTER — Encounter (HOSPITAL_COMMUNITY): Payer: Self-pay

## 2011-02-18 ENCOUNTER — Encounter (HOSPITAL_COMMUNITY): Payer: Self-pay

## 2011-02-21 ENCOUNTER — Encounter (HOSPITAL_COMMUNITY): Payer: Self-pay

## 2011-02-22 NOTE — Assessment & Plan Note (Signed)
[place] OFFICE NOTE   [name], [name]                      MRN:          [id number]  DATE:03/08/2007                            DOB:          1945-11-19    PRIMARY CARE PHYSICIAN:  Darrick Penna. Swords, MD   CLINICAL HISTORY:  [name] is 65 years old and returns for management  of his coronary heart disease after his recent infarct. He had a non-ST  elevation anterior infarction in 2004, treated with a CYPHER stent to  the left anterior descending artery (LAD). 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 The stent was under deplored and was expanded with a large  balloon. He has done well since that time and has had no recurrent chest  pain or palpitations. He does have some shortness of breath with  exertion, but this is improved.   He works at [employer] but is retiring in about 60 days. 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   PAST MEDICAL HISTORY:  Significant for:  1. Diabetes.  2. Hypertension.  3. Hyperlipidemia.  4. He also has a history of ulcerative colitis.   CURRENT MEDICATIONS:  1. Coreg.  2. Plavix.  3. Aspirin.  4. Glyburide.  5. Metformin.  6. Benicar.  7. Lasix.  8. K-Dur.  9. Advicor.  10.Asacol.  11.Synthroid.  12.Folic acid.  13.Omega 3.   PHYSICAL EXAMINATION:  Blood pressure was 162/98 and pulse 61 and  regular.  There was no venous distention. The carotid pulses were full without  bruits.  CHEST: Was clear.  CARDIAC: Rhythm was regular. There were no murmurs or gallops.  ABDOMEN: Was soft without organomegaly.  The peripheral pulses were full and there was trace peripheral edema.   An ECG showed poor R-wave progression and lateral T-wave changes.   IMPRESSION:  1. Coronary artery disease status post non-ST elevation anterior wall      infarction treated with a CYPHER stent  in 2004, with recurrent      anterior wall myocardial infarction January 2008, due to stent      thrombosis to the left anterior descending artery (LAD) treated      with percutaneous transluminal coronary angioplasty (PTCA).  2. Ejection fraction of 35 to 40% at catheterization.  3. Newly diagnosed diabetes.  4. Hypertension, not under good control.  5. Hyperlipidemia with low HDL.  6. History of ulcerative colitis.   RECOMMENDATIONS:  I think [name] is doing well. Will plan to get an  echocardiogram to reevaluate his left ventricular function following his  infarct. His blood pressure is up and we will increase his Coreg from  12.5 b.i.d. to one and a half tablets b.i.d. This is the first elevated  reading he has had according to his account. Will recheck his blood  pressure when he comes in for his echogram. I will plan to see him back  in followup in four months. He plans to followup with Dr.  Leanne [name] before  that.     [name]. Olevia Perches, MD, [facility]  Electronically Signed    BRB/MedQ  DD: 03/08/2007  DT: 03/08/2007  Job #: [id number]   cc:   Darrick Penna. Swords, MD

## 2011-02-22 NOTE — Assessment & Plan Note (Signed)
[facility] OFFICE NOTE   [name], [name]                      MRN:          [id number]  DATE:10/24/2008                            DOB:          1946/10/04    PRIMARY CARE PHYSICIAN:  Darrick Penna. Swords, MD   CLINICAL HISTORY:  [name] is 66 years old and returns for followup  management of his coronary heart disease.  In 2004, he had a non-ST-  elevation myocardial infarction and had a Cypher stent to the LAD.  In  January 2008, he had stent thrombosis and an anterior wall myocardial  infarction treated with PCI, with expansion of his stent and  thrombectomy.  His reperfusion time was quite short and his ventricle  recovered fully with an ejection fraction of 55%.   He has done fairly well from the standpoint of his heart and has had no  recent chest pain or palpitations.  He does get short of breath fairly  easily, but this seems to be related to his weight.   Unfortunately, he has gained quite a bit of weight.  He indicated in  1994 he weighed 165 pounds.  He is 310 pounds today.  A year and half  ago, he was 275 pounds.  He attributes this to a problem with his left  knee which has prevented exercise and from discontinuing cigarettes.   His past medical history is significant for diabetes, hypertension,  hyperlipidemia, and a history of ulcer colitis.   His current medications include:  1. Coreg 25 mg b.i.d.  2. Plavix.  3. Glyburide.  4. Metformin.  5. Aspirin.  6. Benicar 40 mg daily.  7. Furosemide 20 mg daily.  8. Synthroid 50 mcg daily.  9. Folic acid.  10.Simvastatin 40 mg daily.   SOCIAL HISTORY:  [name] used to work at [employer], but retired.  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   On examination, the blood pressure was 140/78 and pulse 58 and regular.  There was no venous distension.  The carotid pulses were full  without  bruits.  Chest was clear.  The cardiac rhythm was regular.  He had no  murmurs or gallops.  The abdomen was soft with normal bowel sounds.  Peripheral pulses were full with no peripheral edema.   Electrocardiogram showed lateral T-wave changes.   IMPRESSION:  1. Coronary artery disease status post non-ST-elevation myocardial      infarction in 2004 treated with a Cypher stent to the left anterior      descending and status post anterior wall ST-elevation myocardial      infarction in January 2008 due to thrombosis treated with      percutaneous coronary intervention and aspiration thrombectomy.  2. Ejection fraction 35-40%, improved to 55% by most recent echo.  3. Congestive heart failure rated diastolic dysfunction, now      compensated.  4. Diabetes.  5. Hypertension.  6. Hyperlipidemia.  7. History of ulcerative colitis.  8. Excess weight.   RECOMMENDATIONS:  I  think [name] is doing well.  His weight is by  far his biggest problem, and I strongly urged him to get his weight down  to the intermediate goal of 190 pounds.  His HDL was low, and his  triglycerides are high, and his blood pressure is slightly high, and his  sugars have been high, and weight reduction can help with all of those  things.  He is in the TRA2P study which is a thrombin receptor  antagonist inhibitor for prevention of long-term ischemic complications.  We will plan to see him back and to follow up in a year.     [name] Olevia Perches, MD, [facility]  Electronically Signed    BRB/MedQ  DD: 10/24/2008  DT: 10/25/2008  Job #: [id number]

## 2011-02-22 NOTE — Assessment & Plan Note (Signed)
[place] OFFICE NOTE   BRAYON, BIELEFELD                      MRN:          [id number]  DATE:10/24/2007                            DOB:          October 17, 1945    PRIMARY CARE PHYSICIAN:  Darrick Penna. Swords, M.D.   CLINICAL HISTORY:  Mr. Archila is 65 years old and returned for follow-up  management of his coronary heart disease.  He had a non ST elevation  myocardial infarction in 2004 treated with a Cypher stent to the LAD and  subsequently had stent thrombosis in January of 2008 treated with  expansion of his previous stents and aspiration thrombectomy.  His  reperfusion time was cut quite short due to him being brought directly  to the cath lab by EMS, and he has done well since that time.  His  ejection fraction improved from 35-40% to 55%.   He says he has been doing well.  He has had no chest pain or  palpitations.  He does have shortness of breath with exertion but has  gained weight over the holidays and attributes it to this.   PAST MEDICAL HISTORY:  1. Diabetes.  2. Hypertension.  3. Hyperlipidemia.  4. History of ulcerative colitis.   CURRENT MEDICATIONS:  1. Coreg.  2. Plavix.  3. Glyburide.  4. Metformin.  5. Aspirin.  6. Benicar.  7. Furosemide.  8. Epicort.  9. Asacol.  10.Synthroid.  11.Folic acid.   PHYSICAL EXAMINATION:  VITAL SIGNS:  Blood pressure is 154/86 with pulse  62 and regular.  NECK:  There was no venous distension.  Carotid pulses were full without  bruits.  CHEST:  Clear.  HEART:  Rhythm was regular.  He had no murmurs or gallops.  ABDOMEN:  Soft without organomegaly.  Peripheral pulses were full, and  there was trace peripheral edema.   Electrocardiogram showed Q waves in V1 and v2 and lateral T-wave changes  and had not changed.   IMPRESSION:  1. Coronary artery disease status post non-ST-elevation myocardial      infarction in 2004 treated with a Cypher stent  and status post      anterior ST elevation myocardial infarction in January 2008 treated      with PTCA and aspiration thrombectomy.  2. Ejection fraction 35-40%, improved to 55% by recent echo.  3. Congestive heart failure with diastolic dysfunction, now      compensated.  4. Diabetes.  5. Hypertension.  6. Hyperlipidemia.  7. History of ulcerative colitis.   RECOMMENDATIONS:  I think Mr. Stencil is doing well.  His blood pressure  is up slightly and his weight is up, and he is committed to getting this  down.  I encouraged him to set some intermediate goals regarding weight  reduction.  We also talked to Mr. Wigington about the TRA2P study which is  a new study with a thrombin receptor antagonist inhibitor.  This is a  drug that has an antiplatelet effect and has minimal bleeding risk and  in pilot studies has shown encouraging results in reducing ischemic  events.  Mr. Arrie Aran talked to Adonis Huguenin with research today and plans to  enter the study.  He will be on this drug for at least 2 years.  It does  not need to be interrupted for surgical procedures or other medical  procedures.  Mr. Dineen treatment now is mainly preventive, and I will  plan to see him back in follow-up in a year.  He should remain on long-  term Plavix.     [name] Olevia Perches, MD, [facility]  Electronically Signed    BRB/MedQ  DD: 10/24/2007  DT: 10/25/2007  Job #: [id number]

## 2011-02-22 NOTE — Assessment & Plan Note (Signed)
[place] OFFICE NOTE   [name], [name]                      MRN:          [id number]  DATE:06/20/2007                            DOB:          [birth year]    PRIMARY CARE PHYSICIAN:  Darrick Penna. Swords, M.D.   CLINICAL HISTORY:  [name] is 65 years old and had a non-ST-elevation  myocardial infarction in 2004 treated with a Cypher stent in the LAD and  in January 2008 had recurrent anterior wall infarction due to stent  thrombosis.  He was brought promptly to the [facility] cath lab by [facility] and was treated very promptly.  His stent was opened and was  expanded with a much larger balloon. He has done quite well since that  time and we did an echocardiogram on him in July which showed an  ejection fraction improved from 35-40% to 55%.   Over the last couple months, he has developed increased weight and some  increased swelling in his lower extremities.  He has had no chest pain  and no shortness of breath.   PAST MEDICAL HISTORY:  1. Diabetes.  2. Hypertension.  3. Hyperlipidemia.  4. History of ulcerative colitis.   CURRENT MEDICATIONS:  Plavix, aspirin, glyburide, metformin, Benicar,  Lasix, K-Dur, Advicor, Synthroid, carvedilol, and Norvasc.   PHYSICAL EXAMINATION:  VITAL SIGNS:  Blood pressure 146/84, pulse 57 and  regular.  NECK:  There was no venous distention.  Carotid pulses were full without  bruits.  The jugular venous pulsations are visible about 1 cm above the  clavicle.  CHEST:  Clear.  CARDIAC:  Rhythm was regular.  I could hear no murmurs or gallops.  ABDOMEN:  Soft without organomegaly.  Bowel sounds are normal.  EXTREMITIES:  There was 1+ peripheral edema.  Pedal pulses were equal.   IMPRESSION:  1. Coronary artery disease, status post non-ST-elevation myocardial      infarction in 2004, treated with a Cypher stent and status post      anterior wall infarction January  2008 due to thrombosis treated      with percutaneous transluminal coronary angioplasty.  2. Ejection fraction of 35-40% at catheterization, improved to 55% by      recent echo.  3. Congestive heart failure related to diastolic dysfunction.  4. Diabetes.  5. Hypertension.  6. Hyperlipidemia.  7. History of ulcerative colitis.   [name] has some volume overload.  Will increase his Lasix from 20 to  40 a day.  He also needed a refill of his Plavix.  Will get a BMP in one  week.  I will plan to see him back in four months.     [name] Olevia Perches, MD, [facility]  Electronically Signed    BRB/MedQ  DD: 06/20/2007  DT: 06/21/2007  Job #: [id number]

## 2011-02-22 NOTE — Procedures (Signed)
[name], [name]               ACCOUNT NO.:  [id number]   MEDICAL RECORD NO.:  [id number]          PATIENT TYPE:  OUT   LOCATION:  SLEEP CENTER                 FACILITY:  [facility]   PHYSICIAN:  Kathee Delton, MD,FCCPDATE OF BIRTH:  October 01, 1946   DATE OF STUDY:  02/14/2008                            NOCTURNAL POLYSOMNOGRAM   REFERRING PHYSICIAN:   LOCATION:  Sleep lab.   REFERRING PHYSICIAN:  Darrick Penna. Swords, M.D.   INDICATIONS FOR THE STUDY:  Hypersomnia with sleep apnea.   EPWORTH SLEEPINESS SCORE:  2   SLEEP ARCHITECTURE:  Patient had a total sleep time during the  diagnostic portion of 120 minutes and 209 minutes during the titration  portion.  During the diagnostic portion, the patient had no slow wave  sleep noted and only 4 minutes of REM.  Sleep onset latency was normal  at 22 minutes, and REM onset was prolonged at 118 minutes.  Sleep  efficiency was 84% and decreased during the diagnostic portion and even  less during the titration portion.   RESPIRATORY DATA:  The patient underwent split-night protocol, where he  was found to have 191 obstructive events in the first 120 minutes of  sleep.  This gave him an apnea-hypopnea index of 96 events per hour  during the diagnostic portion of this study.  The events were not  positional, and there was moderate snoring noted throughout.  By  protocol, he was placed on a large Mirage Quattro full face mask, and  CPAP was increased incrementally to a therapeutic pressure.  At a final  pressure of 13 cmH2O, the patient had adequate control of his  obstructive events and snoring.   OXYGEN DATA:  There was O2 desaturation as low as 74% with the patient's  obstructive events.  The patient also had spontaneous O2 desaturations  at times, not related to his sleep apnea.   CARDIAC DATA:  No clinically significant arrhythmias were noted.   MOVEMENT-PARASOMNIA:  Patient had no leg jerks or abnormal behaviors  noted.   IMPRESSIONS-RECOMMENDATIONS:  Split-night study reveals very severe  obstructive sleep apnea/hypopnea syndrome with an apnea-hypopnea index  during the diagnostic portion of 96 events per hour and oxygen  desaturation as low as 74%.  Patient was placed on CPAP with a large  Mirage Quattro full face mask and titrated to  a final pressure of 13 cmH2O with good control.  He should also be  encouraged to work aggressively on weight loss.      Kathee Delton, MD,FCCP  Diplomate, [place] Board of Sleep  Medicine  Electronically Signed     KMC/MEDQ  D:  02/19/2008 15:00:19  T:  02/19/2008 15:19:51  Job:  [id number]

## 2011-02-23 ENCOUNTER — Encounter (HOSPITAL_COMMUNITY): Payer: Self-pay

## 2011-02-25 ENCOUNTER — Encounter (HOSPITAL_COMMUNITY): Payer: Self-pay

## 2011-02-25 NOTE — Discharge Summary (Signed)
   NAME:  [name], [name]                         ACCOUNT NO.:  [id number]   MEDICAL RECORD NO.:  [id number]                   PATIENT TYPE:  INP   LOCATION:  5729                                 FACILITY:  [facility]   PHYSICIAN:  Colon Branch, MD [facility]                 DATE OF BIRTH:  04-10-1946   DATE OF ADMISSION:  08/06/2003  DATE OF DISCHARGE:  08/10/2003                                 DISCHARGE SUMMARY   ADMISSION DIAGNOSIS:  Cellulitis.   DISCHARGE DIAGNOSES:  1. Cellulitis of the right leg.  2. History of hypertension.  3. History of coronary artery disease, status post a stent in February 2004.  4. History of ulcerative colitis.  5. History of hypothyroidism.   BRIEF HISTORY AND PHYSICAL:  [name] is a 65 year old white male admitted  by Dr. Leanne Chang from the clinic with history of fever, chills, right leg pain  and erythema.  At the clinic his temperature was 103.1.  He did have some  redness of his lower extremity.   LABORATORY AND X-RAYS:  Initial white blood cell count was 17.5 with a  hemoglobin of 15.1 and platelets of 146.  Blood cultures were negative at  the time of discharge.  Sodium 136, potassium 3.9, glucose 134, BUN 15,  creatinine 1.2.  LFTs were normal.   HOSPITAL COURSE:  The patient was admitted to the floor and started on  Ancef; he was continued with his home medicines.  By August 08, 2003 he was  still running temperatures and he was switched from Ancef to Unasyn.  After  that he steadily improved and he became afebrile. The redness and warmness  decreased significantly; however, he continued to have significant edema.  This prompted ultrasound on the day of discharge and it was negative. At  this point, he has received maximal hospital benefit.   DISCHARGE INSTRUCTIONS:  He will be discharged with the following  instructions.  1. Continue all home medications.  2. Augmentation XR 1000 two pills p.o. b.i.d. for 7 days.  3. He is to keep his leg  elevated, to call Dr. Leanne Chang to have an appointment     with him within a week.  4. He is to come back sooner to the office or the hospital if the redness     and swelling resurfaces.                                                Colon Branch, MD [facility]    JEP/MEDQ  D:  08/10/2003  T:  08/10/2003  Job:  [id number]

## 2011-02-25 NOTE — Discharge Summary (Signed)
NAME:  [name], [name]                         ACCOUNT NO.:  [id number]   MEDICAL RECORD NO.:  [id number]                   PATIENT TYPE:  OIB   LOCATION:  6525                                 FACILITY:  [facility]   PHYSICIAN:  Eustace Quail, M.D. [facility]              DATE OF BIRTH:  02-01-1946   DATE OF ADMISSION:  11/14/2002  DATE OF DISCHARGE:  11/15/2002                           DISCHARGE SUMMARY - REFERRING   DISCHARGE DIAGNOSES:  1. Coronary artery disease, status post Cypher stent placement to the left     anterior descending on November 14, 2002.  2. Hypertension, treated.  3. Ulcerative colitis.  4. Hypothyroidism, treated.   HOSPITAL COURSE:  The patient is a 65 year old male who was admitted for  cardiac catheterization on November 14, 2002, who had been experiencing  exertional chest pain over the past three months associated with shortness  of breath.  His past medical history is significant for hypertension.  He  has no history of diabetes or elevated cholesterol.   On November 14, 2002, the patient was brought to the cardiac catheterization  lab by Dr. Eustace Quail with results as follows:  Left main with no  significant disease; LAD to the proximal mid 90-95% stenotic area;  circumflex free of significant disease; RCA with mid 40% lesion.  LV-graphy  revealed an EF of 60% with good wall motion.  The patient then underwent  PTCA/Cypher stenting of the proximal LAD reducing the 90-95% stenosis.  The  patient tolerated the procedure well and remained in the hospital overnight.   Discharge labs showed a sodium 138, potassium 4.3, BUN 12, creatinine 1.0.  Hemoglobin 15.2, hematocrit 44.9, white count 8.7, platelets 177.  Cardiac  isoenzymes were negative.  He was felt to be in stable condition and was  discharged to home.   DISCHARGE MEDICATIONS:  1. Benicar 20 mg a day.  2. Atenolol 50 mg twice a day.  3. Asacol 400 mg three tablets twice a day.  4. Synthroid 50 mcg one  tablet daily.  5. Aciphex 20 mg a day.  6. Lasix 20 mg a day.  7. Enteric coated aspirin 325 mg a day.  8. Plavix 75 mg a day which should be utilized for 12 months post procedure,     according to Dr. Olevia Perches.  9. Sublingual nitroglycerin p.r.n. chest pain.  10.      Tylenol one to two tablets q.6h. p.r.n. pain.  11.      The patient will be on a CETP research study drug and the research     nurses will arrange his medication at followup.   ACTIVITY:  No strenuous activity x2 days and gradually increase activity.   DIET:  Remain on a low-fat diet.    SPECIAL INSTRUCTIONS:  Call for questions or concerns.   FOLLOW UP:  The patient will be followed back in 10 days in the office for  evaluation.      Joesphine Bare, P.A. [facility]                      Eustace Quail, M.D. [facility]    LB/MEDQ  D:  11/15/2002  T:  11/15/2002  Job:  [id number]   cc:   Darrick Penna. Swords, M.D. [facility]  [place]  [place] [address]  Fax: 1

## 2011-02-25 NOTE — Cardiovascular Report (Signed)
NAME:  [name], [name]                         ACCOUNT NO.:  [id number]   MEDICAL RECORD NO.:  [id number]                   PATIENT TYPE:  OIB   LOCATION:  2861                                 FACILITY:  [facility]   PHYSICIAN:  Eustace Quail, M.D. [facility]              DATE OF BIRTH:  1945/12/23   DATE OF PROCEDURE:  11/14/2002  DATE OF DISCHARGE:                              CARDIAC CATHETERIZATION   CLINICAL HISTORY:  The patient is 65 years old and has had no prior history  of known heart disease but has had exertional chest discomfort for the past  few months.  He was seen by Dr. Leanne Chang and I saw him in consultation in the  office and we set him up for evaluation with catheterization.   PROCEDURES PERFORMED:  1. Left heart catheterization.  2. Selective coronary angiography.  3. Stenting of the left anterior descending.  4. Intravascular ultrasound of the right coronary artery.   DESCRIPTION OF PROCEDURE:  The procedure was performed via the right femoral  artery using an arterial sheath and 6 French preformed coronary catheters.  A front wall arterial puncture was performed and Omnipaque contrast was  used. After completion of the diagnostic study, we made a decision to  proceed with intervention on the LAD.  The patient was given weight-adjusted  heparin to prolong the ACT to greater than 200 seconds and was given double  bolus Integrilin in infusion.  We used a 7 [place] guiding catheter with  side holes and a short luge wire. We crossed a long complex lesion in the  proximal LAD with the wire without much difficulty.  We pre-dilated with a  2.5 x 20 mm Maverick performing three inflations up to 6 atmospheres for 20  seconds.  We then deployed a 2.5 x 28 mm Cypher stent deploying this with  one inflation for 15 atmospheres for 58 seconds.  We crossed two diagonal  branches, but the ostium of both diagonal branches was free of disease and  the takeoff of the first diagonal branch  was close to 90 degrees.  We landed  the stent about 10 mm from the ostium of the LAD.  Repeat diagnostic studies  were then performed through the guiding catheter.   The patient had been consented in the CTEP trial and we performed IVUS on  the right coronary artery.  We used a 6 [place] guiding catheter with  side holes and an Atlantis IVUS catheter.  We used a luge wire which we  passed down the vessel without much difficulty.  After giving nitroglycerin,  we passed the IVUS down to the mid to distal vessel past two right  ventricular branches.  We then did automatic pullback.  There was a plaque  that compromised the lumen probably 60% with an unusual contour to the  plaque.  The patient tolerated the procedure well and left the laboratory  in  satisfactory condition.   RESULTS:  1. The left main coronary artery:  The left main coronary artery was free of     significant disease.  2. Left anterior descending:  The left anterior descending artery gave rise     to two diagonal branches and a septal perforator.  There was a long area     of segmental disease extending about 10 mm from the ostium to the second     diagonal branch.  There were focal areas of narrowing of 90 and 95%.  The     ostium of both diagonal branches appear to be free of disease.  3. Circumflex artery:  The circumflex artery gave rise to an intermediate     branch, a marginal branch, an atrial branch, and a posterolateral branch.     These vessels were free of significant disease.  4. Right coronary artery:  The right coronary is a moderate sized vessel     that gave rise to a right posterior descending and two posterolateral     branches.  There was 40% narrowing in the mid vessel with irregularity.   LEFT VENTRICULOGRAM:  The left ventriculogram performed in the RAO  projection showed good wall motion with no areas of hypokinesis.  The  estimated ejection fraction was 60%.   Following stenting of the lesion  in the proximal LAD, the stenoses improved  from 90 and 95% to less than 10%. There appeared to be no compromise to the  diagonal branches.   The IVUS run of the right femoral artery showed moderate plaque in the mid  right coronary artery filling about 60% of the vessel lumen.   CONCLUSIONS:  1. Coronary artery disease with 90 and 95% long segmental stenosis in the     proximal left anterior descending, no major obstruction in the circumflex     artery, 40% narrowing in the mid right coronary artery and normal left     ventricular function.  2. Successful deployment of a Cypher stent in the proximal left anterior     descending artery with improvement in percent diameter narrowing from 95%     to less than 10%.  3. Intravascular ultrasound of the right coronary artery as part of the CETP     study.                                                 Eustace Quail, M.D. [facility]    BB/MEDQ  D:  11/14/2002  T:  11/14/2002  Job:  [id number]   cc:   Darrick Penna. Swords, M.D. [facility]  [place]  [place] [address]  Fax: 1   Cardiopulmonary Laboratory

## 2011-02-25 NOTE — H&P (Signed)
NAMEMACGREGOR, AESCHLIMAN               ACCOUNT NO.:  [id number]   MEDICAL RECORD NO.:  [id number]          PATIENT TYPE:  OIB   LOCATION:  2899                         FACILITY:  [facility]   PHYSICIAN:  Vanna Scotland. Olevia Perches, MD, FACCDATE OF BIRTH:  1946-06-12   DATE OF ADMISSION:  11/07/2006  DATE OF DISCHARGE:                              HISTORY & PHYSICAL   HISTORY OF PRESENT ILLNESS:  This 65 year old Caucasian male who was  brought emergently to [facility] by EMS secondary to chest  pain.  The patient's wife states that he got up for work around 4:30  a.m. where he works at [employer], had bathed and gotten dressed and  around 5:30 began to feel bad.  She noticed that he was cold and  clammy.  He began to have chest pain and began to grunt and had trouble  breathing.  She called EMS about 7 a.m. and they brought him emergently  to the cardiac catheterization lab.  The patient had been up for about  an hour and began to experience chest pain at about 5:30 a.m.  The  patient was brought emergently to cardiac catheterization lab and seen  and examined by Dr. Eustace Quail.  The patient was found to have a in-  stent thrombosis.   PAST MEDICAL HISTORY:  Includes:  1. Hypertension.  2. Dyslipidemia.  3. Lactose intolerant.  4. Irritable bowel syndrome.  5. Obstructive sleep apnea.  6. Coronary artery disease.  7. Hypothyroidism.  8. Cellulitis.   PAST CARDIAC WORKUP:  The patient had a cardiac catheterization in 2004  by Dr. Eustace Quail, revealing CYPHER stent to proximal LAD secondary  LAD 90-95% proximal stenosis.  He was also noted to have 40% narrowing  in the mid RCA.   PAST SURGICAL HISTORY:  Not listed in old records.   FAMILY HISTORY:  Mother has recently died approximately 2 months ago  from myocardial infarction.  Father died status post valve surgery and  he also had a history of CAD and CABG.  He has a brother with  hypertension and obesity and a sister with a  brain tumor.   SOCIAL HISTORY:  He lives in [place] with his wife and grandson.  He  works at [employer].  He does not smoke, has occasional alcohol use.  He has been on a low-cholesterol, low-calorie diet and has recently lost  50 pounds since October of 2007.  No illicit drug use.   CURRENT MEDICATIONS:  At home:  1. Atenolol 50 mg b.i.d.  2. Synthroid 50 mcg once a day.  3. Asacol 400 mg b.i.d.  4. Aspirin 325 once a day.  5. Benicar 40 mg once a day.  6. Plavix 75 mg once a day.  7. Advair 100/50 b.i.d.  8. Lasix 20 mg daily.  9. Simvastatin 40 mg once a day.  10.Folic acid once a day.  11.Avicor 1000 mg once a day.   ALLERGIES:  NO KNOWN DRUG ALLERGIES.   VITAL SIGNS:  On arrival, blood pressure 173/108, pulse 39, respirations  20, temperature was not taken.  EKG, per EMS, revealed acute ST-elevation anteriorly.   Labs are pending.   Chest x-ray is pending.   IMPRESSION:  1. Acute ST-elevated myocardial infarction anteriorly.  2. Hypertension.  3. Hyperthyroidism.  4. Hyperlipidemia.   PLAN:  This is a 64 year old Caucasian male who is admitted after  experiencing chest pain around 5:30 this a.m. after getting up for work  around 4:30 with severe substernal chest discomfort, diaphoresis,  dizziness and shortness of breath, brought emergently to [facility] by EMS with an acute anterior myocardial infarction.   The patient is emergently being evaluated in cardiac catheterization lab  during this H&P dictation per Dr. Eustace Quail.  Please see Dr. Nichola Sizer  cardiac catheterization and intervention note.  The patient will be  followed throughout hospitalization, making further recommendations,  medication adjustments and evaluations as necessary per Dr. Olevia Perches.      Phill Myron. Purcell Nails, NP      Creola Olevia Perches, MD, [facility]  Electronically Signed    KML/MEDQ  D:  11/07/2006  T:  11/07/2006  Job:  [id number]   cc:   Darrick Penna. Swords, MD

## 2011-02-25 NOTE — Assessment & Plan Note (Signed)
[facility]                            CARDIOLOGY OFFICE NOTE   JARMON, [name]                      MRN:          [id number]  DATE:11/30/2006                            DOB:          August 24, 1946    PRIMARY CARE PHYSICIAN:  Dr. Phoebe Sharps.   CLINICAL HISTORY:  [name] is 65 years old, and returns for a  followup visit after his recent anterior wall MI.  He had a Cypher stent  placed in the LAD in 2004, and came in by [facility] on January  29th this year with an acute anterior wall infarction.  He was treated  with PTCA and expansion of his old Cypher stent.  His stent was under  deployed with some malapposition at the distal margin.  He did well  after that, and has had no recurrent chest pain, shortness of breath, or  palpitations.   His past medical history is significant for recently diagnosed diabetes.  He started medicine in the hospital.  He also has a history of  hyperlipidemia and hypertension and lactose intolerance.   CURRENT MEDICATIONS:  1. Coreg.  2. Plavix.  3. Glyburide.  4. Metformin.  5. Aspirin.  6. Benicar.  7. Lasix.  8. K-Dur.  9. Advicor.  10.Asacol for ulcerative colitis.  11.Synthroid.  12.Folic acid.  13.Omega-3.   PHYSICAL EXAMINATION:  The blood pressure was 145/89, the pulse 71 and  regular.  There was no venous distension.  The carotid pulses were full without  bruits.  The chest was clear.  The cardiac rhythm was regular.  I could hear no murmurs or gallops.  The abdomen was protuberant.  There was normal bowel sounds.  There was  no hepatosplenomegaly.  The femoral artery site was well healed.  There was trace peripheral edema.  I had difficulty feeling pedal  pulses.   His ECG showed poor R-wave progression and anterior T-wave changes  consistent with his recent anterior wall infarction.   IMPRESSION:  1. Coronary artery disease with recent anterior wall infarction due to      a  stent thrombosis of a Cypher stent in the LAD, originally      implanted in 2004, now stable.  Ejection fraction 35%-40% by      catheterization.  2. Newly diagnosed diabetes.  3. Hypertension.  4. Hyperlipidemia.  5. History of ulcerative colitis.   RECOMMENDATIONS:  I think [name] is doing well at this point.  He  and Dr. Leanne Chang talked about his return to work on the 25th of February.  That will be a month post infarct, and I think that is okay.  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  He is on Advicor and has a very low HDL, as well as a very low LDL.  Will increase his Coreg from 12.5 to 25 b.i.d. to help maximize his  recovery of left ventricular function.  I plan to see him back in 6  weeks.  Will schedule some type of followup evaluation of his left  ventricular function after that.     [name] Olevia Perches, MD, [facility]  Electronically Signed    BRB/MedQ  DD: 11/30/2006  DT: 11/30/2006  Job #: [id number]   cc:   Darrick Penna. Swords, MD

## 2011-02-25 NOTE — Discharge Summary (Signed)
NAMEIRFAN, VEAL               ACCOUNT NO.:  [id number]   MEDICAL RECORD NO.:  [id number]          PATIENT TYPE:  INP   LOCATION:  2018                         FACILITY:  [facility]   PHYSICIAN:  Vanna Scotland. Olevia Perches, MD, FACCDATE OF BIRTH:  04/17/1946   DATE OF ADMISSION:  11/07/2006  DATE OF DISCHARGE:                               DISCHARGE SUMMARY   PRIMARY CARE PHYSICIAN:  Dr. Phoebe Sharps.   PRIMARY CARDIOLOGIST:  Dr. Eustace Quail.   PRINCIPAL DIAGNOSIS:  Acute anterolateral ST-segment-elevation  myocardial infarction.   SECONDARY DIAGNOSES:  1. Newly diagnosed type 2 diabetes mellitus.  2. Hypertension.  3. Hyperlipidemia.  4. Lactose intolerance.  5. Irritable bowel syndrome.  6. Obstructive sleep apnea.  7. Hypothyroidism.   ALLERGIES:  NO KNOWN DRUG ALLERGIES.   PROCEDURE:  Left heart cardiac catheterization with successful  percutaneous transluminal coronary angioplasty of the proximal left  anterior descending.   HISTORY OF PRESENT ILLNESS:  8 Caucasian male with prior  history of CAD, status post stenting of the proximal LAD in 2004 with  Cypher drug-eluting stent.  He was in his usual state of health until  approximately 5:30 a.m. on November 07, 2006, when while getting ready  for work, he began to experience substernal chest pressure and shortness  of breath associated with diaphoresis.  Symptoms persisted for about an  hour and half prior to EMS being called at approximately 7 a.m. and  following their arrival, ECG was noted to showed anterolateral ST-  segment elevation suggestive of MI.  The patient was taken emergently to  the [facility] for further evaluation.   HOSPITAL COURSE:  On arrival to the cath lab, the patient continued to  complain of mild chest discomfort.  Left heart cardiac catheterization  revealed a total occlusion of the previously placed Cypher drug-eluting  stent with significant thrombus burden.  There was also a  95% lesion in  a small obtuse marginal and otherwise nonobstructive coronary disease.  EF was 35% to 40% with anterior akinesis.  He underwent successful PTCA  within the in-stent thrombosis with good result.  He tolerated this  procedure well and postprocedure, was monitored in the coronary  intensive care unit.  He peaked his CK at 4740, MB at 460.4 and troponin-  I  at greater than 100.  Post procedure, he had no recurrent chest  discomfort and he was maintained on aspirin, beta-blocker, statin, ARB  and high-dose Plavix therapy at 150 mg daily.  It was noted post  procedure that his blood glucose levels were significantly elevated and  we therefore obtained a hemoglobin A1c, which was elevated at 12.0.  He  was initially placed on sliding-scale insulin and then following  evaluation by Internal Medicine, he was also placed on glyburide and  metformin.  He has tolerated this medication well with some improvement  in his sugars throughout the day.  He was transferred to the floor on  January 31 and has been ambulating without difficulty.  He has been seen  by cardiac rehab and counseled on the importance of diabetic diet  adherence, a 2-g sodium diet,, and daily weights in the setting of  ischemic cardiomyopathy with an EF of 35% to 40%.  He has remained  euvolemic throughout his hospitalization and will be discharged home  today in satisfactory condition.   DISCHARGE LABORATORY DATA:  Hemoglobin 13.8, hematocrit 39.9, WBC 6.3,  platelets 156,000, MCV 85.5.  Sodium 137, potassium 3.3 (replaced prior  to discharge), chloride 102, CO2 28, BUN 8, creatinine 0.90, glucose  129, total bilirubin 0.9, alkaline phosphatase 109, AST 66, ALT 30,  albumin 3.0.  CK 637, MB 74.4, troponin I 47.08.  Total cholesterol 77,  triglycerides 179, HDL 22, LDL 19, calcium 8.3.  TSH 0.423.  Hemoglobin  A1c 12.0.   DISPOSITION:  The patient is being discharged home today in good  condition.   FOLLOWUP  PLANS AND APPOINTMENTS:  He is asked to follow up with his  primary care physician, Dr. Phoebe Sharps, at [facility] in 1-2 weeks for  additional diabetes evaluation.  He is follow up Dr. Eustace Quail on  February 21 at 2:15 p.m.   DISCHARGE MEDICATIONS:  1. Coreg 12.5 mg b.i.d.  2. Plavix 75 mg daily.  3. Glyburide 2.5 mg b.i.d.  4. Metformin 500 mg b.i.d.  5. Aspirin 325 mg daily.  6. Benicar 40 mg daily.  7. Lasix 20 mg daily.  8. K-Dur 20 mEq daily.  9. Advicor 1000/40 mg q.p.m.  10.Asacol 400 mg three tablets t.i.d.  11  Synthroid 50 mcg daily.  1. Folic acid 1 mg daily.  2. Nitroglycerin 0.4 mg sublingual p.r.n. chest pain.  3. The patient's atenolol has been discontinued in favor of Coreg.   OUTSTANDING LABORATORY STUDIES:  None.   DURATION OF DISCHARGE ENCOUNTER:  Forty-five minutes including physician  time.      Murray Hodgkins, ANP      Bruce R. Olevia Perches, MD, [facility]  Electronically Signed   CB/MEDQ  D:  11/10/2006  T:  11/10/2006  Job:  [id number]   cc:   Darrick Penna. Swords, MD

## 2011-02-25 NOTE — Discharge Summary (Signed)
NAME:  [name], [name]                         ACCOUNT NO.:  [id number]   MEDICAL RECORD NO.:  [id number]                   PATIENT TYPE:  INP   LOCATION:  1157                                 FACILITY:  [facility]   PHYSICIAN:  Gwendolyn Grant, M.D. [facility]          DATE OF BIRTH:  Sep 22, 1946   DATE OF ADMISSION:  05/06/2003  DATE OF DISCHARGE:  05/07/2003                                 DISCHARGE SUMMARY   DISCHARGE DIAGNOSES:  1. Fever, possible heat exhaustion, no infectious etiology identified.  2. History of hypertension.  3. History of coronary disease, status post, January 2004.  4. History of ulcerative colitis.  5. History of hypothyroidism.   DISCHARGE MEDICATIONS:  1. Doxycycline 100 mg p.o. b.i.d. x10 days.  2. All other medications are as prior admission and include: Asacol 400 mg     p.o. b.i.d.  3. Atenolol 50 mg p.o. b.i.d.  4. Synthroid 50 mcg p.o. daily.  5. Plavix 75 mg p.o. daily.  6. Aspirin 325 mg p.o. daily.  7. Lasix 20 mg p.o. daily.  8. Benicar 40 mg p.o. daily.   FOLLOWUP:  Hospital followup is scheduled with his primary care physician,  Dr. Phoebe Sharps for tomorrow morning, July 19, at 8 a.m. in the outpatient  setting.  The patient and his wife understand that if he is to have  recurrent fevers, he should either contact his primary care physician or  return to the emergency room for further evaluation as he has not yet been  afebrile x24 hours and no infectious source of his etiology has yet been  identified.   DISPOSITION:  Home with his wife and family.   CONDITION ON DISCHARGE:  Medically stable and afebrile.   HOSPITAL COURSE:  Problem #1.  Fever:  The patient is a pleasant 65 year old  gentleman who presented via EMS to the [facility] Emergency Room with a fever of  104.  He stated he had been in his usual state of health without complaints  of headache, abdominal pain, shortness of breath, muscle pain, or confusion  prior to going to work that  day.  The patient works in a [employer] near [employer] and reported becoming extraordinarily nauseated and warm feeling  after lunch in the heat of the building.  He went outside but was not  relieved and went home.  At home, his wife noticed the patient seemed to be  talking out of his head and was flushed.  When she took his temperature, it  was 104 and she called EMS.   EMS transported the patient to the emergency room where temperature was  again noticed to be elevated at 101.  His confusion had improved, per the  wife, by the time he arrived at the emergency room but he was admitted for  observation to rule out infectious sources of his fever.  His UA was  negative.  Chest x-ray:  Negative.  Head CT:  Negative.  LFTs:  Negative.  White count mildly elevated at 15 with 92% neutrophil shift on presentation  to the ER, which was not repeated the following morning.  His cardiac  enzymes were negative.  The patient denied any flares of his ulcerative  colitis.   As his fever had resolved and not recurred since his admission, the patient  was anxious to return home even though no source for the fever had been  found.  He had been given an one time dose of IV Rocephin 2 g in the  emergency room.  After discussion with his primary care physician, Dr. Phoebe Sharps, and possibility of [facility] spotted fever, it was determined  that the patient could be discharged home with close followup within 24  hours.  He will be treated empirically with doxycycline in the meanwhile.  As instructed above, the patient will return to the ER or contact his MD if  fever returns prior to followup.   Problem #2. Other medical issues:  The patient's ulcerative Crohn's disease,  coronary artery disease, and hypertension were treated as per routine  medications prescribed prior to admission.  No changes were made in this  regimen.                                                Gwendolyn Grant,  M.D. [facility]    VL/MEDQ  D:  05/07/2003  T:  05/07/2003  Job:  [id number]

## 2011-02-25 NOTE — Cardiovascular Report (Signed)
[name], [name]               ACCOUNT NO.:  [id number]   MEDICAL RECORD NO.:  [id number]          PATIENT TYPE:  INP   LOCATION:  2807                         FACILITY:  [place]   PHYSICIAN:  Vanna Scotland. Olevia Perches, MD, FACCDATE OF BIRTH:  01/16/1946   DATE OF PROCEDURE:  DATE OF DISCHARGE:                            CARDIAC CATHETERIZATION   PRIMARY CARE PHYSICIAN:  Darrick Penna. Swords, MD   HISTORY OF PRESENT ILLNESS:   HISTORY OF PRESENT ILLNESS:  [name] is 65 year old and had a Cypher  stent placed in the LAD in 2004.  This was a 2.5 x 28 mm stent.  He also  participated in the CEPT trial.  He had again done well, although he has  had a great difficulty with his weight, and he has hypertension and  hyperlipidemia.  At 5:30 this morning, he developed severe chest pain  and called EMS.  He was picked up by [facility] and they did  ECG which showed anterior ST elevation.  Code STEMI was called and he  was brought promptly to the catheterization laboratory.   PROCEDURE:  The procedure was performed via right femoral arterial  sheath and 6-French preformed coronary catheters.  We first did a  diagnostic fistula on the right and then a left ventriculogram and then  went in with a Q-4 guiding catheter with side holes.  The patient was  given antiemetics bolus infusion.  He was enrolled in the the Wrens  Trial which randomized to Plavix versus Cangrelor study drug.   After completion of the diagnostic study, we recognized there was an in-  stent thrombosis in the proximal LAD.  We navigated across the  obstructed stent with a Prowater wire without much difficulty.  We  dilated with a 2.25 x 20-mm Maverick balloon performing two inflations  up to 8 atmospheres for 30 seconds.  This reestablished flow down the  LAD.  We gave some intracoronary nitroglycerin and verapamil.  We then  used an Atlantis IBIS catheter and advanced the catheter beyond the  obstruction and did  automatic pullback.  This documented that the stent  was under deployed.  The stent was about 2.3 mm in diameter distal  reference was 3.25 in diameter and the proximal reference was about 3.25  in diameter.  We then went in with a 3.25 x 20-mm Quantum Maverick and  performed a total of three inflations up to 18 atmospheres for 30  seconds.  We had the balloon all the way to the edge to be sure we  expanded the distal edge and proximal edge of the stent.  Final  diagnostic studies were then performed guiding catheter.  The patient  tolerated procedure well and left the laboratory in satisfactory  condition.   RESULTS:  Left main coronary artery is free of significant disease.   Left anterior descending artery was completely occluded at its proximal  portion at the beginning of the Cypher stent.   The circumflex artery gave rise to a ramus branch, a small marginal  branch, atrial branch and a posterolateral branch.  There was 95%  stenosis in the small marginal branch.   The right coronary artery was a large dominant vessel that gave rise to  a conus branch, right ventricular branch, large posterior descending and  a large posterolateral branch.  Posterior descending branch extended out  to the apex and wrapped around the apex.  The right coronary artery fed  collaterals to septal perforators of the LAD and to a diagonal branch of  the LAD.   The left ventriculogram presented in the RAO projection showed akinesis  of the anterolateral wall.  The tip of the apex moved.  This area was  fed by the right coronary artery.  The estimated ejection fraction of 35-  40%.   The IBIS measurements of the LAD showed a distal reference diameter of  3.25 and a stent diameter of 2.3 mm.  There did not appear to be any  tissue within the stent and we did not see any residual thrombus within  the stent after the balloon dilatations.   Following PTCA of the stent thrombosis, the stenosis improved  from 100%  to 0 % and the flow improved from TIMI 0 to TIMI III flow.   HEMODYNAMIC DATA:  Aortic pressure was 189/106 with a mean of 142 and  left ventricular pressure was 189/37.   The patient had the onset of chest pain at 5:30 and the patient arrived  in the cath lab at 7:23.  The first balloon inflation was at 7:57.  This  gave a door balloon time of 34 minutes and refusion time of 2 hours and  27 minutes.   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  Successful PTCA of the stent  thrombosis within the Cypher stent and the proximal LAD with improvement  in the % narrowing from 100% to 0% and improvement in the flow from TIMI  0 to TIMI III flow.   DISPOSITION:  Will plan to keep the patient on long-term Plavix.  I  think under expansion of the stent contributed to the stent thrombosis  and this should improve the prognosis for a repeat stent thrombosis.      [name] Olevia Perches, MD, [facility]  Electronically Signed     BRB/MEDQ  D:  11/07/2006  T:  11/07/2006  Job:  [id number]   cc:   Darrick Penna. Swords, MD

## 2011-02-25 NOTE — H&P (Signed)
NAME:  [name], [name]                        ACCOUNT NO.:  [id number]   MEDICAL RECORD NO.:  [id number]                   PATIENT TYPE:  INP   LOCATION:                                       FACILITY:  [facility]   PHYSICIAN:  Bruce Lemmie Evens. Swords, M.D. [facility]           DATE OF BIRTH:  1946/06/28   DATE OF ADMISSION:  08/06/2003  DATE OF DISCHARGE:                                HISTORY & PHYSICAL   CHIEF COMPLAINT:  Fevers and leg pain.   HISTORY OF PRESENT ILLNESS:  Mr. Boesen is a 65 year old gentleman with a  complicated medical history.  He presents with a 24-hour history of fever  and leg pain.  Temperature last night was 104 degrees.  Today they noticed  right leg was somewhat warm and red.  He is feeling generally poor  (myalgias, arthralgias, aching, chills, sweats, fevers).  Denies any  shortness of breath, cough, dysuria, abdominal pain, or any other complaints  in review of systems.  There are no other associated signs or symptoms.  He  does say that he has been working a lot the last week (12 hour days) and is  standing on his feet most of that time.   PAST MEDICAL HISTORY:  1. Hypertension.  2. Dyslipidemia.  3. Lactose intolerance.  4. Inflammatory bowel disease.  5. Obstructive sleep apnea.  6. Coronary disease.  He had a stent placed in the LAD on November 14, 2002.  7. Hypothyroidism.   MEDICATIONS:  1. Atenolol 50 mg b.i.d.  2. Synthroid 50 mcg p.o. daily.  3. Asacol 400 mg one p.o. b.i.d.  4. Aspirin 325 mg p.o. daily.  5. Benicar 40 mg p.o. daily.  6. Plavix 75 mg p.o. daily.  7. Advair 100/50 one puff b.i.d.  8. Lasix 20 mg p.o. daily.   FAMILY HISTORY:  Father deceased after a valve surgery.  Mother obese.  He  has two brothers - one with hypertension and obesity, one with Downs  syndrome.  He has two sisters - one with a brain tumor.   SOCIAL HISTORY:  He is married.  He works for [employer].  He is a  nonsmoker, drinks alcohol occasionally.   REVIEW  OF SYSTEMS:  As above.   PHYSICAL EXAMINATION:  VITAL SIGNS:  Temperature 103.1, pulse 90,  respirations 16.  GENERAL:  He appears as an overweight, ill-appearing male in no acute  distress.  HEENT:  Atraumatic, normocephalic.  Extraocular muscles are intact.  NECK:  Supple without lymphadenopathy, thyromegaly, jugular venous  distention, or carotid bruits.  CHEST:  Clear to auscultation without any increased work of breathing.  CARDIAC:  S1 and S2 are regular without murmurs or gallops.  ABDOMEN:  Obese, active bowel sounds, soft, nontender.  There is no  hepatosplenomegaly; no masses are palpated.  EXTREMITIES:  There is no clubbing, cyanosis, or edema.  Right leg  significant for faint erythema of  the right calf and pretibial area, warm to  the touch, with blanching erythema.  No significant edema.   LABORATORY DATA:  His UA is negative.   ASSESSMENT AND PLAN:  Fever with cellulitis.  He appears too ill to go home.  I think it is best to hospitalize him.  Will start Ancef 1 gram IV q.8h.  after appropriate labs and cultures are done.  He will be admitted to [facility].  His other medical problems are stable.  Will continue his  current medications.                                                Bruce Lemmie Evens Swords, M.D. [facility]    BHS/MEDQ  D:  08/06/2003  T:  08/06/2003  Job:  [id number]

## 2011-02-28 ENCOUNTER — Encounter (HOSPITAL_COMMUNITY): Payer: Self-pay

## 2011-03-02 ENCOUNTER — Encounter (HOSPITAL_COMMUNITY): Payer: Self-pay

## 2011-03-04 ENCOUNTER — Encounter (HOSPITAL_COMMUNITY): Payer: Self-pay

## 2011-03-07 ENCOUNTER — Encounter (HOSPITAL_COMMUNITY): Payer: Self-pay

## 2011-03-09 ENCOUNTER — Encounter (HOSPITAL_COMMUNITY): Payer: Self-pay

## 2011-03-11 ENCOUNTER — Encounter (HOSPITAL_COMMUNITY): Payer: Self-pay | Attending: Cardiovascular Disease

## 2011-03-11 DIAGNOSIS — I2510 Atherosclerotic heart disease of native coronary artery without angina pectoris: Secondary | ICD-10-CM | POA: Insufficient documentation

## 2011-03-11 DIAGNOSIS — E785 Hyperlipidemia, unspecified: Secondary | ICD-10-CM | POA: Insufficient documentation

## 2011-03-11 DIAGNOSIS — Z5189 Encounter for other specified aftercare: Secondary | ICD-10-CM | POA: Insufficient documentation

## 2011-03-11 DIAGNOSIS — E119 Type 2 diabetes mellitus without complications: Secondary | ICD-10-CM | POA: Insufficient documentation

## 2011-03-11 DIAGNOSIS — I10 Essential (primary) hypertension: Secondary | ICD-10-CM | POA: Insufficient documentation

## 2011-03-11 DIAGNOSIS — Z9861 Coronary angioplasty status: Secondary | ICD-10-CM | POA: Insufficient documentation

## 2011-03-11 DIAGNOSIS — E669 Obesity, unspecified: Secondary | ICD-10-CM | POA: Insufficient documentation

## 2011-03-11 DIAGNOSIS — I252 Old myocardial infarction: Secondary | ICD-10-CM | POA: Insufficient documentation

## 2011-03-11 DIAGNOSIS — E039 Hypothyroidism, unspecified: Secondary | ICD-10-CM | POA: Insufficient documentation

## 2011-03-14 ENCOUNTER — Encounter (HOSPITAL_COMMUNITY): Payer: Self-pay

## 2011-03-16 ENCOUNTER — Encounter (HOSPITAL_COMMUNITY): Payer: Self-pay

## 2011-03-18 ENCOUNTER — Encounter (HOSPITAL_COMMUNITY): Payer: Self-pay

## 2011-03-21 ENCOUNTER — Encounter (HOSPITAL_COMMUNITY): Payer: Self-pay

## 2011-03-23 ENCOUNTER — Encounter (HOSPITAL_COMMUNITY): Payer: Self-pay

## 2011-03-25 ENCOUNTER — Encounter (HOSPITAL_COMMUNITY): Payer: Self-pay

## 2011-03-28 ENCOUNTER — Encounter (HOSPITAL_COMMUNITY): Payer: Self-pay

## 2011-03-30 ENCOUNTER — Encounter (HOSPITAL_COMMUNITY): Payer: Self-pay

## 2011-04-01 ENCOUNTER — Encounter (HOSPITAL_COMMUNITY): Payer: Self-pay

## 2011-04-04 ENCOUNTER — Encounter (HOSPITAL_COMMUNITY): Payer: Self-pay

## 2011-04-06 ENCOUNTER — Encounter (HOSPITAL_COMMUNITY): Payer: Self-pay

## 2011-04-07 ENCOUNTER — Other Ambulatory Visit: Payer: Self-pay | Admitting: *Deleted

## 2011-04-07 MED ORDER — AMLODIPINE BESYLATE 10 MG PO TABS: 10.0000 mg | ORAL_TABLET | Freq: Every day | ORAL | Status: DC

## 2011-04-08 ENCOUNTER — Encounter (HOSPITAL_COMMUNITY): Payer: Self-pay

## 2011-04-11 ENCOUNTER — Encounter (HOSPITAL_COMMUNITY): Payer: Self-pay | Attending: Cardiovascular Disease

## 2011-04-11 DIAGNOSIS — I10 Essential (primary) hypertension: Secondary | ICD-10-CM | POA: Insufficient documentation

## 2011-04-11 DIAGNOSIS — I2510 Atherosclerotic heart disease of native coronary artery without angina pectoris: Secondary | ICD-10-CM | POA: Insufficient documentation

## 2011-04-11 DIAGNOSIS — Z9861 Coronary angioplasty status: Secondary | ICD-10-CM | POA: Insufficient documentation

## 2011-04-11 DIAGNOSIS — E119 Type 2 diabetes mellitus without complications: Secondary | ICD-10-CM | POA: Insufficient documentation

## 2011-04-11 DIAGNOSIS — E669 Obesity, unspecified: Secondary | ICD-10-CM | POA: Insufficient documentation

## 2011-04-11 DIAGNOSIS — I252 Old myocardial infarction: Secondary | ICD-10-CM | POA: Insufficient documentation

## 2011-04-11 DIAGNOSIS — E039 Hypothyroidism, unspecified: Secondary | ICD-10-CM | POA: Insufficient documentation

## 2011-04-11 DIAGNOSIS — Z5189 Encounter for other specified aftercare: Secondary | ICD-10-CM | POA: Insufficient documentation

## 2011-04-11 DIAGNOSIS — E785 Hyperlipidemia, unspecified: Secondary | ICD-10-CM | POA: Insufficient documentation

## 2011-04-13 ENCOUNTER — Encounter (HOSPITAL_COMMUNITY): Payer: Self-pay

## 2011-04-15 ENCOUNTER — Encounter (HOSPITAL_COMMUNITY): Payer: Self-pay

## 2011-04-18 ENCOUNTER — Encounter (HOSPITAL_COMMUNITY): Payer: Self-pay

## 2011-04-20 ENCOUNTER — Encounter (HOSPITAL_COMMUNITY): Payer: Self-pay

## 2011-04-22 ENCOUNTER — Other Ambulatory Visit: Payer: Self-pay | Admitting: *Deleted

## 2011-04-22 ENCOUNTER — Encounter (HOSPITAL_COMMUNITY): Payer: Self-pay

## 2011-04-22 MED ORDER — LEVOTHYROXINE SODIUM 50 MCG PO TABS: 50.0000 ug | ORAL_TABLET | Freq: Every day | ORAL | Status: DC

## 2011-04-25 ENCOUNTER — Encounter (HOSPITAL_COMMUNITY): Payer: Self-pay

## 2011-04-27 ENCOUNTER — Encounter (HOSPITAL_COMMUNITY): Payer: Self-pay

## 2011-04-29 ENCOUNTER — Encounter (HOSPITAL_COMMUNITY): Payer: Self-pay

## 2011-05-02 ENCOUNTER — Other Ambulatory Visit: Payer: BC Managed Care – PPO

## 2011-05-02 ENCOUNTER — Encounter (HOSPITAL_COMMUNITY): Payer: Self-pay

## 2011-05-04 ENCOUNTER — Encounter (HOSPITAL_COMMUNITY): Payer: Self-pay

## 2011-05-06 ENCOUNTER — Encounter (HOSPITAL_COMMUNITY): Payer: Self-pay

## 2011-05-08 ENCOUNTER — Other Ambulatory Visit: Payer: Self-pay | Admitting: Internal Medicine

## 2011-05-08 ENCOUNTER — Other Ambulatory Visit: Payer: Self-pay | Admitting: Cardiovascular Disease

## 2011-05-09 ENCOUNTER — Encounter (HOSPITAL_COMMUNITY): Payer: Self-pay

## 2011-05-09 ENCOUNTER — Ambulatory Visit: Payer: BC Managed Care – PPO | Admitting: Internal Medicine

## 2011-05-11 ENCOUNTER — Encounter (HOSPITAL_COMMUNITY): Payer: Self-pay | Attending: Cardiovascular Disease

## 2011-05-11 DIAGNOSIS — I10 Essential (primary) hypertension: Secondary | ICD-10-CM | POA: Insufficient documentation

## 2011-05-11 DIAGNOSIS — E669 Obesity, unspecified: Secondary | ICD-10-CM | POA: Insufficient documentation

## 2011-05-11 DIAGNOSIS — E039 Hypothyroidism, unspecified: Secondary | ICD-10-CM | POA: Insufficient documentation

## 2011-05-11 DIAGNOSIS — I252 Old myocardial infarction: Secondary | ICD-10-CM | POA: Insufficient documentation

## 2011-05-11 DIAGNOSIS — E119 Type 2 diabetes mellitus without complications: Secondary | ICD-10-CM | POA: Insufficient documentation

## 2011-05-11 DIAGNOSIS — Z9861 Coronary angioplasty status: Secondary | ICD-10-CM | POA: Insufficient documentation

## 2011-05-11 DIAGNOSIS — E785 Hyperlipidemia, unspecified: Secondary | ICD-10-CM | POA: Insufficient documentation

## 2011-05-11 DIAGNOSIS — Z5189 Encounter for other specified aftercare: Secondary | ICD-10-CM | POA: Insufficient documentation

## 2011-05-11 DIAGNOSIS — I2510 Atherosclerotic heart disease of native coronary artery without angina pectoris: Secondary | ICD-10-CM | POA: Insufficient documentation

## 2011-05-13 ENCOUNTER — Encounter (HOSPITAL_COMMUNITY): Payer: Self-pay

## 2011-05-16 ENCOUNTER — Encounter (HOSPITAL_COMMUNITY): Payer: Self-pay

## 2011-05-18 ENCOUNTER — Encounter (HOSPITAL_COMMUNITY): Payer: Self-pay

## 2011-05-20 ENCOUNTER — Encounter (HOSPITAL_COMMUNITY): Payer: Self-pay

## 2011-05-23 ENCOUNTER — Encounter (HOSPITAL_COMMUNITY): Payer: Self-pay

## 2011-05-24 ENCOUNTER — Other Ambulatory Visit: Payer: Self-pay | Admitting: *Deleted

## 2011-05-25 ENCOUNTER — Encounter (HOSPITAL_COMMUNITY): Payer: Self-pay

## 2011-05-27 ENCOUNTER — Encounter (HOSPITAL_COMMUNITY): Payer: Self-pay

## 2011-05-30 ENCOUNTER — Encounter (HOSPITAL_COMMUNITY): Payer: Self-pay

## 2011-05-31 ENCOUNTER — Ambulatory Visit: Payer: BC Managed Care – PPO | Admitting: Internal Medicine

## 2011-06-01 ENCOUNTER — Encounter (HOSPITAL_COMMUNITY): Payer: Self-pay

## 2011-06-02 ENCOUNTER — Other Ambulatory Visit: Payer: Self-pay | Admitting: *Deleted

## 2011-06-03 ENCOUNTER — Encounter (HOSPITAL_COMMUNITY): Payer: Self-pay

## 2011-06-06 ENCOUNTER — Other Ambulatory Visit: Payer: Self-pay | Admitting: Internal Medicine

## 2011-06-06 ENCOUNTER — Encounter (HOSPITAL_COMMUNITY): Payer: Self-pay

## 2011-06-08 ENCOUNTER — Encounter (HOSPITAL_COMMUNITY): Payer: Self-pay

## 2011-06-10 ENCOUNTER — Encounter (HOSPITAL_COMMUNITY): Payer: Self-pay

## 2011-06-13 ENCOUNTER — Encounter (HOSPITAL_COMMUNITY): Payer: Self-pay | Attending: Cardiovascular Disease

## 2011-06-13 DIAGNOSIS — I10 Essential (primary) hypertension: Secondary | ICD-10-CM | POA: Insufficient documentation

## 2011-06-13 DIAGNOSIS — I252 Old myocardial infarction: Secondary | ICD-10-CM | POA: Insufficient documentation

## 2011-06-13 DIAGNOSIS — E039 Hypothyroidism, unspecified: Secondary | ICD-10-CM | POA: Insufficient documentation

## 2011-06-13 DIAGNOSIS — I2510 Atherosclerotic heart disease of native coronary artery without angina pectoris: Secondary | ICD-10-CM | POA: Insufficient documentation

## 2011-06-13 DIAGNOSIS — E119 Type 2 diabetes mellitus without complications: Secondary | ICD-10-CM | POA: Insufficient documentation

## 2011-06-13 DIAGNOSIS — Z5189 Encounter for other specified aftercare: Secondary | ICD-10-CM | POA: Insufficient documentation

## 2011-06-13 DIAGNOSIS — Z9861 Coronary angioplasty status: Secondary | ICD-10-CM | POA: Insufficient documentation

## 2011-06-13 DIAGNOSIS — E785 Hyperlipidemia, unspecified: Secondary | ICD-10-CM | POA: Insufficient documentation

## 2011-06-13 DIAGNOSIS — E669 Obesity, unspecified: Secondary | ICD-10-CM | POA: Insufficient documentation

## 2011-06-15 ENCOUNTER — Encounter (HOSPITAL_COMMUNITY): Payer: Self-pay

## 2011-06-17 ENCOUNTER — Encounter (HOSPITAL_COMMUNITY): Payer: Self-pay

## 2011-06-20 ENCOUNTER — Encounter (HOSPITAL_COMMUNITY): Payer: Self-pay

## 2011-06-22 ENCOUNTER — Other Ambulatory Visit (INDEPENDENT_AMBULATORY_CARE_PROVIDER_SITE_OTHER): Payer: Medicare Other

## 2011-06-22 ENCOUNTER — Encounter (HOSPITAL_COMMUNITY): Payer: Self-pay

## 2011-06-22 DIAGNOSIS — E119 Type 2 diabetes mellitus without complications: Secondary | ICD-10-CM

## 2011-06-22 DIAGNOSIS — I10 Essential (primary) hypertension: Secondary | ICD-10-CM

## 2011-06-22 LAB — HEPATIC FUNCTION PANEL
ALT: 23 U/L (ref 0–53)
Albumin: 4.1 g/dL (ref 3.5–5.2)
Total Bilirubin: 0.8 mg/dL (ref 0.3–1.2)
Total Protein: 6.8 g/dL (ref 6.0–8.3)

## 2011-06-22 LAB — BASIC METABOLIC PANEL
BUN: 13 mg/dL (ref 6–23)
Chloride: 100 mEq/L (ref 96–112)
Potassium: 4.1 mEq/L (ref 3.5–5.1)

## 2011-06-22 LAB — HEMOGLOBIN A1C: Hgb A1c MFr Bld: 8.7 % — ABNORMAL HIGH (ref 4.6–6.5)

## 2011-06-22 LAB — LIPID PANEL
Cholesterol: 89 mg/dL (ref 0–200)
HDL: 59.2 mg/dL (ref >39.00)
Triglycerides: 147 mg/dL (ref 0.0–149.0)
VLDL: 29.4 mg/dL (ref 0.0–40.0)

## 2011-06-24 ENCOUNTER — Encounter (HOSPITAL_COMMUNITY): Payer: Self-pay

## 2011-06-27 ENCOUNTER — Encounter (HOSPITAL_COMMUNITY): Payer: Self-pay

## 2011-06-29 ENCOUNTER — Encounter (HOSPITAL_COMMUNITY): Payer: Self-pay

## 2011-06-29 ENCOUNTER — Ambulatory Visit: Payer: BC Managed Care – PPO | Admitting: Internal Medicine

## 2011-07-01 ENCOUNTER — Encounter (HOSPITAL_COMMUNITY): Payer: Self-pay

## 2011-07-04 ENCOUNTER — Encounter (HOSPITAL_COMMUNITY): Payer: Self-pay

## 2011-07-05 ENCOUNTER — Other Ambulatory Visit: Payer: Self-pay | Admitting: Internal Medicine

## 2011-07-05 ENCOUNTER — Other Ambulatory Visit: Payer: Self-pay | Admitting: Cardiovascular Disease

## 2011-07-06 ENCOUNTER — Ambulatory Visit (INDEPENDENT_AMBULATORY_CARE_PROVIDER_SITE_OTHER): Payer: Medicare Other | Admitting: Internal Medicine

## 2011-07-06 ENCOUNTER — Encounter (HOSPITAL_COMMUNITY): Payer: Self-pay

## 2011-07-06 ENCOUNTER — Encounter: Payer: Self-pay | Admitting: Internal Medicine

## 2011-07-06 VITALS — BP 114/82 | HR 80 | Temp 97.9°F | Ht 69.0 in | Wt 310.0 lb

## 2011-07-06 DIAGNOSIS — E119 Type 2 diabetes mellitus without complications: Secondary | ICD-10-CM

## 2011-07-06 DIAGNOSIS — I10 Essential (primary) hypertension: Secondary | ICD-10-CM

## 2011-07-06 DIAGNOSIS — E538 Deficiency of other specified B group vitamins: Secondary | ICD-10-CM

## 2011-07-06 DIAGNOSIS — Z23 Encounter for immunization: Secondary | ICD-10-CM

## 2011-07-06 DIAGNOSIS — I2510 Atherosclerotic heart disease of native coronary artery without angina pectoris: Secondary | ICD-10-CM

## 2011-07-06 MED ORDER — SITAGLIPTIN PHOSPHATE 100 MG PO TABS: 100.0000 mg | ORAL_TABLET | Freq: Every day | ORAL | Status: DC

## 2011-07-06 NOTE — Patient Instructions (Signed)
Call your insurance company and see if they will cover shingles vaccine. If they will, call us and we will give it to you

## 2011-07-06 NOTE — Assessment & Plan Note (Signed)
No symptoms. Continue current medications. He has regular followup with cardiology.

## 2011-07-06 NOTE — Assessment & Plan Note (Signed)
BP Readings from Last 3 Encounters:  07/06/11 114/82  01/13/11 124/70  01/12/11 138/82   Adequate control. Continue current medications.

## 2011-07-06 NOTE — Assessment & Plan Note (Addendum)
Not well controlled. He does not follow diet or exercise plan. He is taking his medications. I think he needs further treatment. Will start Januvia 100 mg by mouth daily. Samples given.

## 2011-07-06 NOTE — Progress Notes (Signed)
  Subjective:    Patient ID: [name], male    DOB: 04/05/1946, 65 y.o.   MRN: [id number]  HPI   patient comes in for followup of multiple medical problems including type 2 diabetes, hyperlipidemia, hypertension. The patient does not check blood sugar or blood pressure at home. The patetient does not follow an exercise or diet program. The patient denies any polyuria, polydipsia.  In the past the patient has gone to diabetic treatment center. The patient is tolerating medications  Without difficulty. The patient does admit to medication compliance.  Not exercising  Past Medical History  Diagnosis Date  . CORONARY ARTERY DISEASE 04/12/2007  . DIABETES MELLITUS, TYPE II 04/16/2007  . HYPERLIPIDEMIA 04/16/2007  . HYPERTENSION 04/12/2007  . HYPOTHYROIDISM 04/12/2007  . MYOCARDIAL INFARCTION, HX OF 04/12/2007  . OBESITY 06/16/2009  . SLEEP APNEA 04/12/2007  . ULCERATIVE COLITIS, LEFT SIDED 11/23/2010  . Ischemic cardiomyopathy     cath 35-40%  . Atrial fibrillation     initial diagnoses 2012   Past Surgical History  Procedure Date  . Coronary stent placement 2008    LAD   . Cardiac catheterization 10/2002    STENT  . Tonsillectomy     reports that he quit smoking about 44 years ago. His smoking use included Cigarettes. He does not have any smokeless tobacco history on file. He reports that he does not drink alcohol. His drug history not on file. family history includes Cancer in his maternal aunt and paternal grandmother; Heart attack in his mother; Heart disease in his father; Hypertension in his brother; and Obesity in his brother. No Known Allergies   Review of Systems  patient denies chest pain, shortness of breath, orthopnea. Denies lower extremity edema, abdominal pain, change in appetite, change in bowel movements. Patient denies rashes, musculoskeletal complaints. No other specific complaints in a complete review of systems.      Objective:   Physical Exam  well-developed  well-nourished male in no acute distress. HEENT exam atraumatic, normocephalic, neck supple without jugular venous distention. Chest clear to auscultation cardiac exam S1-S2 are regular. Abdominal exam overweight with bowel sounds, soft and nontender. Extremities no edema. Neurologic exam is alert with a normal gait.        Assessment & Plan:

## 2011-07-07 ENCOUNTER — Other Ambulatory Visit: Payer: Self-pay | Admitting: *Deleted

## 2011-07-07 MED ORDER — MESALAMINE ER 0.375 G PO CP24: ORAL_CAPSULE | ORAL | Status: DC

## 2011-07-08 ENCOUNTER — Encounter (HOSPITAL_COMMUNITY): Payer: Self-pay

## 2011-07-11 ENCOUNTER — Encounter (HOSPITAL_COMMUNITY): Payer: Self-pay | Attending: Cardiovascular Disease

## 2011-07-11 DIAGNOSIS — I252 Old myocardial infarction: Secondary | ICD-10-CM | POA: Insufficient documentation

## 2011-07-11 DIAGNOSIS — I10 Essential (primary) hypertension: Secondary | ICD-10-CM | POA: Insufficient documentation

## 2011-07-11 DIAGNOSIS — E039 Hypothyroidism, unspecified: Secondary | ICD-10-CM | POA: Insufficient documentation

## 2011-07-11 DIAGNOSIS — E669 Obesity, unspecified: Secondary | ICD-10-CM | POA: Insufficient documentation

## 2011-07-11 DIAGNOSIS — Z5189 Encounter for other specified aftercare: Secondary | ICD-10-CM | POA: Insufficient documentation

## 2011-07-11 DIAGNOSIS — E119 Type 2 diabetes mellitus without complications: Secondary | ICD-10-CM | POA: Insufficient documentation

## 2011-07-11 DIAGNOSIS — E785 Hyperlipidemia, unspecified: Secondary | ICD-10-CM | POA: Insufficient documentation

## 2011-07-11 DIAGNOSIS — Z9861 Coronary angioplasty status: Secondary | ICD-10-CM | POA: Insufficient documentation

## 2011-07-11 DIAGNOSIS — I2510 Atherosclerotic heart disease of native coronary artery without angina pectoris: Secondary | ICD-10-CM | POA: Insufficient documentation

## 2011-07-12 LAB — GLUCOSE, CAPILLARY: Glucose-Capillary: 157 — ABNORMAL HIGH

## 2011-07-13 ENCOUNTER — Encounter (HOSPITAL_COMMUNITY): Payer: Self-pay

## 2011-07-15 ENCOUNTER — Encounter (HOSPITAL_COMMUNITY): Payer: Self-pay

## 2011-07-18 ENCOUNTER — Encounter (HOSPITAL_COMMUNITY): Payer: Self-pay

## 2011-07-19 ENCOUNTER — Ambulatory Visit (INDEPENDENT_AMBULATORY_CARE_PROVIDER_SITE_OTHER): Payer: Medicare Other | Admitting: Internal Medicine

## 2011-07-19 DIAGNOSIS — Z23 Encounter for immunization: Secondary | ICD-10-CM

## 2011-07-19 DIAGNOSIS — Z2911 Encounter for prophylactic immunotherapy for respiratory syncytial virus (RSV): Secondary | ICD-10-CM

## 2011-07-20 ENCOUNTER — Encounter (HOSPITAL_COMMUNITY): Payer: Self-pay

## 2011-07-21 ENCOUNTER — Other Ambulatory Visit: Payer: Self-pay | Admitting: Gastroenterology

## 2011-07-21 MED ORDER — MESALAMINE ER 0.375 G PO CP24: ORAL_CAPSULE | ORAL | Status: DC

## 2011-07-21 NOTE — Telephone Encounter (Signed)
Filled pt's script once- he has a f/u visit on 08/09/11. lmom .

## 2011-07-22 ENCOUNTER — Encounter (HOSPITAL_COMMUNITY): Payer: Self-pay

## 2011-07-25 ENCOUNTER — Encounter (HOSPITAL_COMMUNITY): Payer: Self-pay

## 2011-07-27 ENCOUNTER — Encounter (HOSPITAL_COMMUNITY): Payer: Self-pay

## 2011-07-29 ENCOUNTER — Encounter (HOSPITAL_COMMUNITY): Payer: Self-pay

## 2011-08-01 ENCOUNTER — Encounter (HOSPITAL_COMMUNITY): Payer: Self-pay

## 2011-08-03 ENCOUNTER — Encounter (HOSPITAL_COMMUNITY): Payer: Self-pay

## 2011-08-05 ENCOUNTER — Other Ambulatory Visit: Payer: Self-pay | Admitting: *Deleted

## 2011-08-05 ENCOUNTER — Encounter (HOSPITAL_COMMUNITY): Payer: Self-pay

## 2011-08-05 MED ORDER — POTASSIUM CHLORIDE CRYS ER 20 MEQ PO TBCR: 20.0000 meq | EXTENDED_RELEASE_TABLET | Freq: Every day | ORAL | Status: DC

## 2011-08-08 ENCOUNTER — Encounter (HOSPITAL_COMMUNITY): Payer: Self-pay

## 2011-08-09 ENCOUNTER — Ambulatory Visit (INDEPENDENT_AMBULATORY_CARE_PROVIDER_SITE_OTHER): Payer: Medicare Other | Admitting: Gastroenterology

## 2011-08-09 ENCOUNTER — Encounter: Payer: Self-pay | Admitting: Gastroenterology

## 2011-08-09 VITALS — BP 126/80 | HR 88 | Ht 69.0 in | Wt 316.0 lb

## 2011-08-09 DIAGNOSIS — K5100 Ulcerative (chronic) pancolitis without complications: Secondary | ICD-10-CM

## 2011-08-09 DIAGNOSIS — K5289 Other specified noninfective gastroenteritis and colitis: Secondary | ICD-10-CM

## 2011-08-09 DIAGNOSIS — K219 Gastro-esophageal reflux disease without esophagitis: Secondary | ICD-10-CM | POA: Insufficient documentation

## 2011-08-09 DIAGNOSIS — K529 Noninfective gastroenteritis and colitis, unspecified: Secondary | ICD-10-CM

## 2011-08-09 DIAGNOSIS — E669 Obesity, unspecified: Secondary | ICD-10-CM

## 2011-08-09 HISTORY — DX: Morbid (severe) obesity due to excess calories: E66.01

## 2011-08-09 HISTORY — DX: Gastro-esophageal reflux disease without esophagitis: K21.9

## 2011-08-09 HISTORY — DX: Ulcerative (chronic) pancolitis without complications: K51.00

## 2011-08-09 MED ORDER — MESALAMINE ER 0.375 G PO CP24: ORAL_CAPSULE | ORAL | Status: DC

## 2011-08-09 NOTE — Progress Notes (Signed)
This is a 65 year old Caucasian male with multiple cardiac issues and recent onset diabetes related to his severe obesity. He has chronic inflammatory bowel disease for the past 30 years, and is currently asymptomatic on oral amiodarone salicylate therapy. Colonoscopy one year ago with multiple biopsies showed no evidence of dysplasia. He currently denies abdominal pain, diarrhea, or rectal bleeding.  Current Medications, Allergies, Past Medical History, Past Surgical History, Family History and Social History were reviewed in [employer] record.     Physical Exam: Morbidly obese Caucasian male in no acute distress. Abdominal examination is difficult but I cannot appreciate any definite organomegaly, masses or tenderness. Bowel sounds are normal.    Assessment and Plan: Chronic ulcerative colitis in remission on oral mucosal is slight therapy which we will continue at current dosages. We will do his colonoscopy every 3-5 years with annual GI evaluations. He is to continue his other medications and followup with Dr. Leanne Chang planned. Encounter Diagnoses  Name Primary?  . IBD (inflammatory bowel disease) Yes  . GERD (gastroesophageal reflux disease)   . Obesity

## 2011-08-09 NOTE — Patient Instructions (Signed)
We will refill your Medication today Call back as needed

## 2011-08-10 ENCOUNTER — Encounter (HOSPITAL_COMMUNITY): Payer: Self-pay

## 2011-08-12 ENCOUNTER — Encounter (HOSPITAL_COMMUNITY): Payer: Self-pay

## 2011-08-15 ENCOUNTER — Encounter (HOSPITAL_COMMUNITY): Payer: Self-pay

## 2011-08-15 DIAGNOSIS — I2510 Atherosclerotic heart disease of native coronary artery without angina pectoris: Secondary | ICD-10-CM | POA: Insufficient documentation

## 2011-08-15 DIAGNOSIS — E119 Type 2 diabetes mellitus without complications: Secondary | ICD-10-CM | POA: Insufficient documentation

## 2011-08-15 DIAGNOSIS — E785 Hyperlipidemia, unspecified: Secondary | ICD-10-CM | POA: Insufficient documentation

## 2011-08-15 DIAGNOSIS — Z5189 Encounter for other specified aftercare: Secondary | ICD-10-CM | POA: Insufficient documentation

## 2011-08-15 DIAGNOSIS — Z9861 Coronary angioplasty status: Secondary | ICD-10-CM | POA: Insufficient documentation

## 2011-08-15 DIAGNOSIS — I10 Essential (primary) hypertension: Secondary | ICD-10-CM | POA: Insufficient documentation

## 2011-08-15 DIAGNOSIS — I252 Old myocardial infarction: Secondary | ICD-10-CM | POA: Insufficient documentation

## 2011-08-15 DIAGNOSIS — E669 Obesity, unspecified: Secondary | ICD-10-CM | POA: Insufficient documentation

## 2011-08-15 DIAGNOSIS — E039 Hypothyroidism, unspecified: Secondary | ICD-10-CM | POA: Insufficient documentation

## 2011-08-17 ENCOUNTER — Encounter (HOSPITAL_COMMUNITY): Payer: Self-pay

## 2011-08-19 ENCOUNTER — Encounter (HOSPITAL_COMMUNITY): Payer: Self-pay

## 2011-08-22 ENCOUNTER — Encounter (HOSPITAL_COMMUNITY): Payer: Self-pay

## 2011-08-22 MED ORDER — DARBEPOETIN ALFA-POLYSORBATE 60 MCG/0.3ML IJ SOLN
INTRAMUSCULAR | Status: AC
  Filled 2011-08-22: qty 0.3

## 2011-08-24 ENCOUNTER — Encounter (HOSPITAL_COMMUNITY): Payer: Self-pay

## 2011-08-26 ENCOUNTER — Encounter (HOSPITAL_COMMUNITY): Payer: Self-pay

## 2011-08-29 ENCOUNTER — Encounter (HOSPITAL_COMMUNITY)
Admission: RE | Admit: 2011-08-29 | Discharge: 2011-08-29 | Disposition: A | Discharge status: Home / Self Care | Payer: Self-pay | Source: Ambulatory Visit | Attending: Cardiovascular Disease | Admitting: Cardiovascular Disease

## 2011-08-31 ENCOUNTER — Encounter (HOSPITAL_COMMUNITY)
Admission: RE | Admit: 2011-08-31 | Discharge: 2011-08-31 | Disposition: A | Discharge status: Home / Self Care | Payer: Self-pay | Source: Ambulatory Visit | Attending: Cardiovascular Disease | Admitting: Cardiovascular Disease

## 2011-09-02 ENCOUNTER — Encounter (HOSPITAL_COMMUNITY): Payer: Self-pay

## 2011-09-05 ENCOUNTER — Encounter (HOSPITAL_COMMUNITY)
Admission: RE | Admit: 2011-09-05 | Discharge: 2011-09-05 | Disposition: A | Discharge status: Home / Self Care | Payer: Self-pay | Source: Ambulatory Visit | Attending: Cardiovascular Disease | Admitting: Cardiovascular Disease

## 2011-09-07 ENCOUNTER — Encounter (HOSPITAL_COMMUNITY)
Admission: RE | Admit: 2011-09-07 | Discharge: 2011-09-07 | Disposition: A | Discharge status: Home / Self Care | Payer: Self-pay | Source: Ambulatory Visit | Attending: Cardiovascular Disease | Admitting: Cardiovascular Disease

## 2011-09-09 ENCOUNTER — Encounter (HOSPITAL_COMMUNITY)
Admission: RE | Admit: 2011-09-09 | Discharge: 2011-09-09 | Disposition: A | Discharge status: Home / Self Care | Payer: Self-pay | Source: Ambulatory Visit | Attending: Cardiovascular Disease | Admitting: Cardiovascular Disease

## 2011-09-12 ENCOUNTER — Encounter (HOSPITAL_COMMUNITY)
Admission: RE | Admit: 2011-09-12 | Discharge: 2011-09-12 | Disposition: A | Discharge status: Home / Self Care | Payer: Self-pay | Source: Ambulatory Visit | Attending: Cardiovascular Disease | Admitting: Cardiovascular Disease

## 2011-09-12 DIAGNOSIS — I2510 Atherosclerotic heart disease of native coronary artery without angina pectoris: Secondary | ICD-10-CM | POA: Insufficient documentation

## 2011-09-12 DIAGNOSIS — Z5189 Encounter for other specified aftercare: Secondary | ICD-10-CM | POA: Insufficient documentation

## 2011-09-12 DIAGNOSIS — I252 Old myocardial infarction: Secondary | ICD-10-CM | POA: Insufficient documentation

## 2011-09-12 DIAGNOSIS — E039 Hypothyroidism, unspecified: Secondary | ICD-10-CM | POA: Insufficient documentation

## 2011-09-12 DIAGNOSIS — E785 Hyperlipidemia, unspecified: Secondary | ICD-10-CM | POA: Insufficient documentation

## 2011-09-12 DIAGNOSIS — I10 Essential (primary) hypertension: Secondary | ICD-10-CM | POA: Insufficient documentation

## 2011-09-12 DIAGNOSIS — Z9861 Coronary angioplasty status: Secondary | ICD-10-CM | POA: Insufficient documentation

## 2011-09-12 DIAGNOSIS — E119 Type 2 diabetes mellitus without complications: Secondary | ICD-10-CM | POA: Insufficient documentation

## 2011-09-12 DIAGNOSIS — E669 Obesity, unspecified: Secondary | ICD-10-CM | POA: Insufficient documentation

## 2011-09-14 ENCOUNTER — Encounter (HOSPITAL_COMMUNITY)
Admission: RE | Admit: 2011-09-14 | Discharge: 2011-09-14 | Disposition: A | Discharge status: Home / Self Care | Payer: Self-pay | Source: Ambulatory Visit | Attending: Cardiovascular Disease | Admitting: Cardiovascular Disease

## 2011-09-16 ENCOUNTER — Encounter (HOSPITAL_COMMUNITY): Payer: Self-pay

## 2011-09-19 ENCOUNTER — Encounter (HOSPITAL_COMMUNITY)
Admission: RE | Admit: 2011-09-19 | Discharge: 2011-09-19 | Disposition: A | Discharge status: Home / Self Care | Payer: Self-pay | Source: Ambulatory Visit | Attending: Cardiovascular Disease | Admitting: Cardiovascular Disease

## 2011-09-20 ENCOUNTER — Telehealth: Payer: Self-pay | Admitting: Internal Medicine

## 2011-09-20 NOTE — Telephone Encounter (Signed)
[facility] [employer] or [place] with [employer] called to check on status of getting an order for pts diabetic testing supplies. Will accept verbal order or faxed written order. A request to get order approved, was sent over on 11/28 and 12/4. Pls fax to [phone]

## 2011-09-21 ENCOUNTER — Encounter (HOSPITAL_COMMUNITY)
Admission: RE | Admit: 2011-09-21 | Discharge: 2011-09-21 | Disposition: A | Discharge status: Home / Self Care | Payer: Self-pay | Source: Ambulatory Visit | Attending: Cardiovascular Disease | Admitting: Cardiovascular Disease

## 2011-09-23 ENCOUNTER — Encounter (HOSPITAL_COMMUNITY)
Admission: RE | Admit: 2011-09-23 | Discharge: 2011-09-23 | Disposition: A | Discharge status: Home / Self Care | Payer: Self-pay | Source: Ambulatory Visit | Attending: Cardiovascular Disease | Admitting: Cardiovascular Disease

## 2011-09-23 NOTE — Telephone Encounter (Signed)
Form faxed

## 2011-09-26 ENCOUNTER — Encounter (HOSPITAL_COMMUNITY)
Admission: RE | Admit: 2011-09-26 | Discharge: 2011-09-26 | Disposition: A | Discharge status: Home / Self Care | Payer: Self-pay | Source: Ambulatory Visit | Attending: Cardiovascular Disease | Admitting: Cardiovascular Disease

## 2011-09-27 ENCOUNTER — Telehealth: Payer: Self-pay | Admitting: Internal Medicine

## 2011-09-27 NOTE — Telephone Encounter (Signed)
[employer] or [place] with [employer] called to check on status of getting an order for pts diabetic testing supplies. Will accept verbal order or faxed written order. Informed rep that form was faxed back to fax # given, on 09/23/11. Representative said that fax was not rcvd. Pls resend to fax # [phone].

## 2011-09-28 ENCOUNTER — Encounter (HOSPITAL_COMMUNITY)
Admission: RE | Admit: 2011-09-28 | Discharge: 2011-09-28 | Disposition: A | Discharge status: Home / Self Care | Payer: Self-pay | Source: Ambulatory Visit | Attending: Cardiovascular Disease | Admitting: Cardiovascular Disease

## 2011-09-28 NOTE — Telephone Encounter (Signed)
Called them and asked to refax form and they will

## 2011-09-30 ENCOUNTER — Encounter (HOSPITAL_COMMUNITY)
Admission: RE | Admit: 2011-09-30 | Discharge: 2011-09-30 | Disposition: A | Discharge status: Home / Self Care | Payer: Self-pay | Source: Ambulatory Visit | Attending: Cardiovascular Disease | Admitting: Cardiovascular Disease

## 2011-10-03 ENCOUNTER — Encounter (HOSPITAL_COMMUNITY): Payer: Self-pay

## 2011-10-05 ENCOUNTER — Encounter (HOSPITAL_COMMUNITY): Payer: Self-pay

## 2011-10-05 ENCOUNTER — Other Ambulatory Visit: Payer: Self-pay | Admitting: Cardiovascular Disease

## 2011-10-07 ENCOUNTER — Encounter (HOSPITAL_COMMUNITY): Payer: Self-pay

## 2011-10-10 ENCOUNTER — Encounter (HOSPITAL_COMMUNITY): Payer: Self-pay

## 2011-10-12 ENCOUNTER — Encounter (HOSPITAL_COMMUNITY): Payer: Self-pay

## 2011-10-12 ENCOUNTER — Telehealth: Payer: Self-pay | Admitting: Family Medicine

## 2011-10-12 NOTE — Telephone Encounter (Signed)
[facility] called and left a voice message. The form that we faxed in was not filled in correctly. Please call [phone], which I did and gave our fax #, so they can resend the form to complete.

## 2011-10-14 ENCOUNTER — Encounter (HOSPITAL_COMMUNITY)
Admission: RE | Admit: 2011-10-14 | Discharge: 2011-10-14 | Disposition: A | Discharge status: Home / Self Care | Payer: Self-pay | Source: Ambulatory Visit | Attending: Cardiovascular Disease | Admitting: Cardiovascular Disease

## 2011-10-14 DIAGNOSIS — Z9861 Coronary angioplasty status: Secondary | ICD-10-CM | POA: Insufficient documentation

## 2011-10-14 DIAGNOSIS — E669 Obesity, unspecified: Secondary | ICD-10-CM | POA: Insufficient documentation

## 2011-10-14 DIAGNOSIS — E785 Hyperlipidemia, unspecified: Secondary | ICD-10-CM | POA: Insufficient documentation

## 2011-10-14 DIAGNOSIS — I2510 Atherosclerotic heart disease of native coronary artery without angina pectoris: Secondary | ICD-10-CM | POA: Insufficient documentation

## 2011-10-14 DIAGNOSIS — E119 Type 2 diabetes mellitus without complications: Secondary | ICD-10-CM | POA: Insufficient documentation

## 2011-10-14 DIAGNOSIS — I10 Essential (primary) hypertension: Secondary | ICD-10-CM | POA: Insufficient documentation

## 2011-10-14 DIAGNOSIS — E039 Hypothyroidism, unspecified: Secondary | ICD-10-CM | POA: Insufficient documentation

## 2011-10-14 DIAGNOSIS — I252 Old myocardial infarction: Secondary | ICD-10-CM | POA: Insufficient documentation

## 2011-10-14 DIAGNOSIS — Z5189 Encounter for other specified aftercare: Secondary | ICD-10-CM | POA: Insufficient documentation

## 2011-10-17 ENCOUNTER — Encounter (HOSPITAL_COMMUNITY)
Admission: RE | Admit: 2011-10-17 | Discharge: 2011-10-17 | Disposition: A | Discharge status: Home / Self Care | Payer: Self-pay | Source: Ambulatory Visit | Attending: Cardiovascular Disease | Admitting: Cardiovascular Disease

## 2011-10-19 ENCOUNTER — Encounter (HOSPITAL_COMMUNITY)
Admission: RE | Admit: 2011-10-19 | Discharge: 2011-10-19 | Disposition: A | Discharge status: Home / Self Care | Payer: Self-pay | Source: Ambulatory Visit | Attending: Cardiovascular Disease | Admitting: Cardiovascular Disease

## 2011-10-21 ENCOUNTER — Encounter (HOSPITAL_COMMUNITY)
Admission: RE | Admit: 2011-10-21 | Discharge: 2011-10-21 | Disposition: A | Discharge status: Home / Self Care | Payer: Self-pay | Source: Ambulatory Visit | Attending: Cardiovascular Disease | Admitting: Cardiovascular Disease

## 2011-10-21 ENCOUNTER — Other Ambulatory Visit: Payer: Self-pay | Admitting: *Deleted

## 2011-10-21 MED ORDER — CARVEDILOL 25 MG PO TABS: 25.0000 mg | ORAL_TABLET | Freq: Two times a day (BID) | ORAL | Status: DC

## 2011-10-24 ENCOUNTER — Encounter (HOSPITAL_COMMUNITY)
Admission: RE | Admit: 2011-10-24 | Discharge: 2011-10-24 | Disposition: A | Discharge status: Home / Self Care | Payer: Self-pay | Source: Ambulatory Visit | Attending: Cardiovascular Disease | Admitting: Cardiovascular Disease

## 2011-10-24 NOTE — Telephone Encounter (Signed)
I spoke with [facility] and they did receive all of the completed information that was needed.

## 2011-10-26 ENCOUNTER — Encounter (HOSPITAL_COMMUNITY): Payer: Self-pay

## 2011-10-28 ENCOUNTER — Encounter (HOSPITAL_COMMUNITY): Payer: Self-pay

## 2011-10-31 ENCOUNTER — Encounter (HOSPITAL_COMMUNITY)
Admission: RE | Admit: 2011-10-31 | Discharge: 2011-10-31 | Disposition: A | Discharge status: Home / Self Care | Payer: Self-pay | Source: Ambulatory Visit | Attending: Cardiovascular Disease | Admitting: Cardiovascular Disease

## 2011-11-01 ENCOUNTER — Ambulatory Visit (INDEPENDENT_AMBULATORY_CARE_PROVIDER_SITE_OTHER): Payer: Medicare Other | Admitting: Internal Medicine

## 2011-11-01 DIAGNOSIS — E119 Type 2 diabetes mellitus without complications: Secondary | ICD-10-CM

## 2011-11-01 DIAGNOSIS — E538 Deficiency of other specified B group vitamins: Secondary | ICD-10-CM

## 2011-11-01 DIAGNOSIS — I10 Essential (primary) hypertension: Secondary | ICD-10-CM

## 2011-11-01 DIAGNOSIS — L989 Disorder of the skin and subcutaneous tissue, unspecified: Secondary | ICD-10-CM

## 2011-11-01 DIAGNOSIS — I2510 Atherosclerotic heart disease of native coronary artery without angina pectoris: Secondary | ICD-10-CM

## 2011-11-01 LAB — BASIC METABOLIC PANEL
CO2: 32 mEq/L (ref 19–32)
Chloride: 103 mEq/L (ref 96–112)
Sodium: 142 mEq/L (ref 135–145)

## 2011-11-01 LAB — HEPATIC FUNCTION PANEL
Albumin: 4.2 g/dL (ref 3.5–5.2)
Total Protein: 7 g/dL (ref 6.0–8.3)

## 2011-11-01 LAB — LIPID PANEL
Cholesterol: 98 mg/dL (ref 0–200)
LDL Cholesterol: 5 mg/dL (ref 0–99)
Triglycerides: 141 mg/dL (ref 0.0–149.0)
VLDL: 28.2 mg/dL (ref 0.0–40.0)

## 2011-11-01 NOTE — Progress Notes (Signed)
Patient ID: [name], male   DOB: 12/31/1945, 66 y.o.   MRN: [id number]  patient comes in for followup of multiple medical problems including type 2 diabetes, hyperlipidemia, hypertension. The patient does not check blood sugar or blood pressure at home. The patetient does not follow an exercise or diet program. The patient denies any polyuria, polydipsia.  In the past the patient has gone to diabetic treatment center. The patient is tolerating medications  Without difficulty. The patient does admit to medication compliance.   Past Medical History  Diagnosis Date  . CORONARY ARTERY DISEASE 04/12/2007  . DIABETES MELLITUS, TYPE II 04/16/2007  . HYPERLIPIDEMIA 04/16/2007  . HYPERTENSION 04/12/2007  . HYPOTHYROIDISM 04/12/2007  . MYOCARDIAL INFARCTION, HX OF 04/12/2007  . OBESITY 06/16/2009  . SLEEP APNEA 04/12/2007  . ULCERATIVE COLITIS, LEFT SIDED 11/23/2010  . Ischemic cardiomyopathy     cath 35-40%  . Atrial fibrillation     initial diagnoses 2012    History   Social History  . Marital Status: Married    Spouse Name: N/A    Number of Children: 1  . Years of Education: N/A   Occupational History  . RETIRED    Social History Main Topics  . Smoking status: Former Smoker    Types: Cigarettes    Quit date: 04/14/1967  . Smokeless tobacco: Never Used  . Alcohol Use: Yes     social  . Drug Use: No  . Sexually Active: Not on file   Other Topics Concern  . Not on file   Social History Narrative   PT DOES NOT EXERCISE ON A REGULAR BASIS.Marland KitchenDAILY CAFFEINE USE.GRANDDAUGHTER (MS. PETTIGREW) IS AN RN ON 2000 @ [facility] [place]    Past Surgical History  Procedure Date  . Coronary stent placement 2008    LAD   . Cardiac catheterization 10/2002    STENT  . Tonsillectomy     Family History  Problem Relation Age of Onset  . Heart attack Mother   . Heart disease Father     H/O CAD, CABG, VALVE SURGERY  . Hypertension Brother   . Obesity Brother   . Stomach cancer Maternal Aunt   . Stomach cancer  Paternal Grandmother     No Known Allergies  Current Outpatient Prescriptions on File Prior to Visit  Medication Sig Dispense Refill  . amLODipine (NORVASC) 10 MG tablet TAKE ONE (1) TABLET BY MOUTH EVERY      DAY  30 tablet  6  . aspirin 81 MG EC tablet Take 81 mg by mouth daily.        Marland Kitchen BENICAR 40 MG tablet TAKE ONE (1) TABLET EACH DAY  30 tablet  5  . carvedilol (COREG) 25 MG tablet Take 1 tablet (25 mg total) by mouth 2 (two) times daily with a meal.  60 tablet  1  . econazole nitrate 1 % cream       . fish oil-omega-3 fatty acids 1000 MG capsule Take 2 g by mouth daily.       . furosemide (LASIX) 40 MG tablet TAKE ONE TABLET ONCE DAILY  30 tablet  10  . Glucosamine-Chondroitin (OSTEO BI-FLEX REGULAR STRENGTH) 250-200 MG TABS Take 1 tablet by mouth 2 (two) times daily.       Marland Kitchen glyBURIDE (DIABETA) 2.5 MG tablet TAKE ONE (1) TABLET BY MOUTH EVERY      DAY  90 tablet  1  . levothyroxine (SYNTHROID) 50 MCG tablet Take 1 tablet (50 mcg total) by  mouth daily.  90 tablet  2  . mesalamine (APRISO) 0.375 G 24 hr capsule Take 4 capsules in the morning...Marland KitchenMarland KitchenMarland Kitchen MUST HAVE OFFICE VISIT  120 capsule  6  . metFORMIN (GLUCOPHAGE) 500 MG tablet TAKE ONE (1) TABLET EACH DAY  60 tablet  5  . ONE TOUCH ULTRA TEST test strip       . PLAVIX 75 MG tablet Take 75 mg by mouth daily.       . potassium chloride SA (KLOR-CON M20) 20 MEQ tablet Take 1 tablet (20 mEq total) by mouth daily.  90 tablet  1  . PRADAXA 150 MG CAPS TAKE ONE CAPSULE BY MOUTH TWICE A DAY  60 capsule  11  . sitaGLIPtin (JANUVIA) 100 MG tablet Take 1 tablet (100 mg total) by mouth daily.  90 tablet  3  . triamcinolone (KENALOG) 0.5 % cream Apply 1 application topically 2 (two) times daily. Apply bid to affected area       . loratadine-pseudoephedrine (CLARITIN-D 24-HOUR) 10-240 MG per 24 hr tablet Take 1 tablet by mouth daily as needed.        Current Facility-Administered Medications on File Prior to Visit  Medication Dose Route Frequency  Provider Last Rate Last Dose  . cyanocobalamin ((VITAMIN B-12)) injection 1,000 mcg  1,000 mcg Intramuscular Q30 days Chancy Hurter, MD   1,000 mcg at 11/01/11 1102     patient denies chest pain, shortness of breath, orthopnea. Denies lower extremity edema, abdominal pain, change in appetite, change in bowel movements. Patient denies rashes, musculoskeletal complaints. No other specific complaints in a complete review of systems.   BP 112/72  Pulse 80  Temp(Src) 97.9 F (36.6 C) (Oral)  Wt 312 lb (141.522 kg)  well-developed well-nourished male in no acute distress. HEENT exam atraumatic, normocephalic, neck supple without jugular venous distention. Chest clear to auscultation cardiac exam S1-S2 are regular. Abdominal exam overweight with bowel sounds, soft and nontender. Extremities no edema. Neurologic exam is alert with a normal gait.

## 2011-11-01 NOTE — Assessment & Plan Note (Signed)
Check labs today.

## 2011-11-01 NOTE — Assessment & Plan Note (Signed)
No sxs Risk factor modification

## 2011-11-01 NOTE — Assessment & Plan Note (Signed)
BP: 112/72 mmHg ; Reasonable control Reviewed previous labs

## 2011-11-02 ENCOUNTER — Encounter (HOSPITAL_COMMUNITY)
Admission: RE | Admit: 2011-11-02 | Discharge: 2011-11-02 | Disposition: A | Discharge status: Home / Self Care | Payer: Self-pay | Source: Ambulatory Visit | Attending: Cardiovascular Disease | Admitting: Cardiovascular Disease

## 2011-11-02 ENCOUNTER — Other Ambulatory Visit: Payer: Self-pay | Admitting: Internal Medicine

## 2011-11-04 ENCOUNTER — Encounter (HOSPITAL_COMMUNITY)
Admission: RE | Admit: 2011-11-04 | Discharge: 2011-11-04 | Disposition: A | Discharge status: Home / Self Care | Payer: Self-pay | Source: Ambulatory Visit | Attending: Cardiovascular Disease | Admitting: Cardiovascular Disease

## 2011-11-05 ENCOUNTER — Other Ambulatory Visit: Payer: Self-pay | Admitting: Internal Medicine

## 2011-11-07 ENCOUNTER — Encounter (HOSPITAL_COMMUNITY): Payer: Self-pay

## 2011-11-07 DIAGNOSIS — B079 Viral wart, unspecified: Secondary | ICD-10-CM | POA: Diagnosis not present

## 2011-11-09 ENCOUNTER — Encounter (HOSPITAL_COMMUNITY)
Admission: RE | Admit: 2011-11-09 | Discharge: 2011-11-09 | Disposition: A | Discharge status: Home / Self Care | Payer: Self-pay | Source: Ambulatory Visit | Attending: Cardiovascular Disease | Admitting: Cardiovascular Disease

## 2011-11-11 ENCOUNTER — Encounter (HOSPITAL_COMMUNITY)
Admission: RE | Admit: 2011-11-11 | Discharge: 2011-11-11 | Disposition: A | Discharge status: Home / Self Care | Payer: Self-pay | Source: Ambulatory Visit | Attending: Cardiovascular Disease | Admitting: Cardiovascular Disease

## 2011-11-11 DIAGNOSIS — E669 Obesity, unspecified: Secondary | ICD-10-CM | POA: Insufficient documentation

## 2011-11-11 DIAGNOSIS — I252 Old myocardial infarction: Secondary | ICD-10-CM | POA: Insufficient documentation

## 2011-11-11 DIAGNOSIS — I10 Essential (primary) hypertension: Secondary | ICD-10-CM | POA: Insufficient documentation

## 2011-11-11 DIAGNOSIS — Z9861 Coronary angioplasty status: Secondary | ICD-10-CM | POA: Insufficient documentation

## 2011-11-11 DIAGNOSIS — Z5189 Encounter for other specified aftercare: Secondary | ICD-10-CM | POA: Insufficient documentation

## 2011-11-11 DIAGNOSIS — E039 Hypothyroidism, unspecified: Secondary | ICD-10-CM | POA: Insufficient documentation

## 2011-11-11 DIAGNOSIS — E785 Hyperlipidemia, unspecified: Secondary | ICD-10-CM | POA: Insufficient documentation

## 2011-11-11 DIAGNOSIS — E119 Type 2 diabetes mellitus without complications: Secondary | ICD-10-CM | POA: Insufficient documentation

## 2011-11-11 DIAGNOSIS — I2510 Atherosclerotic heart disease of native coronary artery without angina pectoris: Secondary | ICD-10-CM | POA: Insufficient documentation

## 2011-11-14 ENCOUNTER — Encounter (HOSPITAL_COMMUNITY): Payer: Self-pay

## 2011-11-16 ENCOUNTER — Encounter (HOSPITAL_COMMUNITY)
Admission: RE | Admit: 2011-11-16 | Discharge: 2011-11-16 | Disposition: A | Discharge status: Home / Self Care | Payer: Self-pay | Source: Ambulatory Visit | Attending: Cardiovascular Disease | Admitting: Cardiovascular Disease

## 2011-11-18 ENCOUNTER — Encounter (HOSPITAL_COMMUNITY)
Admission: RE | Admit: 2011-11-18 | Discharge: 2011-11-18 | Disposition: A | Discharge status: Home / Self Care | Payer: Self-pay | Source: Ambulatory Visit | Attending: Cardiovascular Disease | Admitting: Cardiovascular Disease

## 2011-11-21 ENCOUNTER — Encounter (HOSPITAL_COMMUNITY)
Admission: RE | Admit: 2011-11-21 | Discharge: 2011-11-21 | Disposition: A | Discharge status: Home / Self Care | Payer: Self-pay | Source: Ambulatory Visit | Attending: Cardiovascular Disease | Admitting: Cardiovascular Disease

## 2011-11-23 ENCOUNTER — Encounter (HOSPITAL_COMMUNITY)
Admission: RE | Admit: 2011-11-23 | Discharge: 2011-11-23 | Disposition: A | Discharge status: Home / Self Care | Payer: Self-pay | Source: Ambulatory Visit | Attending: Cardiovascular Disease | Admitting: Cardiovascular Disease

## 2011-11-24 ENCOUNTER — Encounter: Payer: Self-pay | Admitting: Gastroenterology

## 2011-11-25 ENCOUNTER — Encounter (HOSPITAL_COMMUNITY)
Admission: RE | Admit: 2011-11-25 | Discharge: 2011-11-25 | Disposition: A | Discharge status: Home / Self Care | Payer: Self-pay | Source: Ambulatory Visit | Attending: Cardiovascular Disease | Admitting: Cardiovascular Disease

## 2011-11-28 ENCOUNTER — Encounter (HOSPITAL_COMMUNITY)
Admission: RE | Admit: 2011-11-28 | Discharge: 2011-11-28 | Disposition: A | Discharge status: Home / Self Care | Payer: Self-pay | Source: Ambulatory Visit | Attending: Cardiovascular Disease | Admitting: Cardiovascular Disease

## 2011-11-30 ENCOUNTER — Encounter (HOSPITAL_COMMUNITY)
Admission: RE | Admit: 2011-11-30 | Discharge: 2011-11-30 | Disposition: A | Discharge status: Home / Self Care | Payer: Self-pay | Source: Ambulatory Visit | Attending: Cardiovascular Disease | Admitting: Cardiovascular Disease

## 2011-12-02 ENCOUNTER — Encounter (HOSPITAL_COMMUNITY): Payer: Self-pay

## 2011-12-05 ENCOUNTER — Encounter (HOSPITAL_COMMUNITY): Payer: Self-pay

## 2011-12-07 ENCOUNTER — Encounter (HOSPITAL_COMMUNITY): Payer: Self-pay

## 2011-12-09 ENCOUNTER — Encounter (HOSPITAL_COMMUNITY)
Admission: RE | Admit: 2011-12-09 | Discharge: 2011-12-09 | Disposition: A | Discharge status: Home / Self Care | Payer: Self-pay | Source: Ambulatory Visit | Attending: Cardiovascular Disease | Admitting: Cardiovascular Disease

## 2011-12-09 DIAGNOSIS — I2510 Atherosclerotic heart disease of native coronary artery without angina pectoris: Secondary | ICD-10-CM | POA: Insufficient documentation

## 2011-12-09 DIAGNOSIS — E669 Obesity, unspecified: Secondary | ICD-10-CM | POA: Insufficient documentation

## 2011-12-09 DIAGNOSIS — E119 Type 2 diabetes mellitus without complications: Secondary | ICD-10-CM | POA: Insufficient documentation

## 2011-12-09 DIAGNOSIS — Z5189 Encounter for other specified aftercare: Secondary | ICD-10-CM | POA: Insufficient documentation

## 2011-12-09 DIAGNOSIS — I252 Old myocardial infarction: Secondary | ICD-10-CM | POA: Insufficient documentation

## 2011-12-09 DIAGNOSIS — Z9861 Coronary angioplasty status: Secondary | ICD-10-CM | POA: Insufficient documentation

## 2011-12-09 DIAGNOSIS — E039 Hypothyroidism, unspecified: Secondary | ICD-10-CM | POA: Insufficient documentation

## 2011-12-09 DIAGNOSIS — E785 Hyperlipidemia, unspecified: Secondary | ICD-10-CM | POA: Insufficient documentation

## 2011-12-09 DIAGNOSIS — I10 Essential (primary) hypertension: Secondary | ICD-10-CM | POA: Insufficient documentation

## 2011-12-12 ENCOUNTER — Encounter (HOSPITAL_COMMUNITY)
Admission: RE | Admit: 2011-12-12 | Discharge: 2011-12-12 | Disposition: A | Discharge status: Home / Self Care | Payer: Self-pay | Source: Ambulatory Visit | Attending: Cardiovascular Disease | Admitting: Cardiovascular Disease

## 2011-12-14 ENCOUNTER — Encounter (HOSPITAL_COMMUNITY): Payer: Self-pay

## 2011-12-16 ENCOUNTER — Encounter (HOSPITAL_COMMUNITY)
Admission: RE | Admit: 2011-12-16 | Discharge: 2011-12-16 | Disposition: A | Discharge status: Home / Self Care | Payer: Self-pay | Source: Ambulatory Visit | Attending: Cardiovascular Disease | Admitting: Cardiovascular Disease

## 2011-12-16 ENCOUNTER — Other Ambulatory Visit: Payer: Self-pay | Admitting: Cardiovascular Disease

## 2011-12-19 ENCOUNTER — Encounter (HOSPITAL_COMMUNITY)
Admission: RE | Admit: 2011-12-19 | Discharge: 2011-12-19 | Disposition: A | Discharge status: Home / Self Care | Payer: Self-pay | Source: Ambulatory Visit | Attending: Cardiovascular Disease | Admitting: Cardiovascular Disease

## 2011-12-19 ENCOUNTER — Encounter: Payer: Medicare Other | Attending: Internal Medicine | Admitting: *Deleted

## 2011-12-19 ENCOUNTER — Encounter: Payer: Self-pay | Admitting: *Deleted

## 2011-12-19 DIAGNOSIS — E119 Type 2 diabetes mellitus without complications: Secondary | ICD-10-CM | POA: Diagnosis not present

## 2011-12-19 DIAGNOSIS — Z713 Dietary counseling and surveillance: Secondary | ICD-10-CM | POA: Insufficient documentation

## 2011-12-19 NOTE — Progress Notes (Signed)
  Medical Nutrition Therapy:  Appt start time: 1130 end time:  1230.  Assessment:  Primary concerns today: Patient here for nutrition counseling for obesity and diabetes. He retired about 6 years ago and had a heart attack in 2008. His activity level was reduced significantly and he has gained quite a bit of weight. He states he is checking his BG once a day in AM with a range of 125-135 mg/dl. He attends Cardiac Rehab 3 days a week and states his A1c recently went up, but does not have the results with him.   MEDICATIONS: see list. Diabetes Medications include Gliburide, Metformin and Januvia, all of which he takes when he gets up in the AM   DIETARY INTAKE:  Usual eating pattern includes 3 meals and 1-2 snacks per day.  Everyday foods include good variety of all food groups.  Avoided foods include lactose intolerant, red meat.    24-hr recall:  B (11 AM): sausage, egg sandwich on whole wheat, OR cereal bar, banana, water Snk ( AM): none  L ( PM): soup with cheese sandwich or crackers, decaf coffee with Splenda or water Snk ( PM): none D ( PM): tuna salad with pickle relish, pineapple with cottage cheese, water OR anything if eats out Snk ( PM): popcorn, left overs from refrigerator,  Beverages: water, decaf coffee,   Usual physical activity: cardiac rehab 3 days a week, treadmill at home in front of tv news,   Estimated energy needs: 1600 calories 180 g carbohydrates 120 g protein 44 g fat  Progress Towards Goal(s):  In progress.   Nutritional Diagnosis:  NI-1.5 Excessive energy intake As related to obesity and diabetes.  As evidenced by BMI of 46.6 %.    Intervention:  Nutrition counseling and diabetes education provided. Discussed basic physiology of diabetes, action of his diabetes medications, signs, symptoms and treatment of hyper and hypoglycemia, carb counting and label reading. Goals:  Follow Diabetes Meal Plan as instructed  Eat 3 meals and snacks if hungry,    Limit carbohydrate intake to 45-60 grams carbohydrate/meal  Limit carbohydrate intake to 0-30 grams carbohydrate/snack  Monitor glucose levels as instructed by your doctor, consider checking after a meal 2-3 times a week  Aim for 15 mins of physical activity daily in addition to Cardiac Rehab on Monday, Wednesday and Fridays  Bring glucose log to your next nutrition visit   Handouts given during visit include:  Living Well with Diabetes  Carb Counting and Reading Food Labels handouts  Meal Plan card  Monitoring/Evaluation:  Dietary intake, exercise, reading food labels, and body weight in 5 week(s).

## 2011-12-19 NOTE — Patient Instructions (Addendum)
Goals:  Follow Diabetes Meal Plan as instructed  Eat 3 meals and snacks if hungry,   Limit carbohydrate intake to 45-60 grams carbohydrate/meal  Limit carbohydrate intake to 0-30 grams carbohydrate/snack  Monitor glucose levels as instructed by your doctor, consider checking after a meal 2-3 times a week  Aim for 15 mins of physical activity daily in addition to Cardiac Rehab on Monday, Wednesday and Fridays  Bring glucose log to your next nutrition visit

## 2011-12-21 ENCOUNTER — Encounter (HOSPITAL_COMMUNITY): Payer: Self-pay

## 2011-12-23 ENCOUNTER — Encounter (HOSPITAL_COMMUNITY)
Admission: RE | Admit: 2011-12-23 | Discharge: 2011-12-23 | Disposition: A | Discharge status: Home / Self Care | Payer: Self-pay | Source: Ambulatory Visit | Attending: Cardiovascular Disease | Admitting: Cardiovascular Disease

## 2011-12-26 ENCOUNTER — Encounter (HOSPITAL_COMMUNITY)
Admission: RE | Admit: 2011-12-26 | Discharge: 2011-12-26 | Disposition: A | Discharge status: Home / Self Care | Payer: Self-pay | Source: Ambulatory Visit | Attending: Cardiovascular Disease | Admitting: Cardiovascular Disease

## 2011-12-28 ENCOUNTER — Encounter (HOSPITAL_COMMUNITY)
Admission: RE | Admit: 2011-12-28 | Discharge: 2011-12-28 | Disposition: A | Discharge status: Home / Self Care | Payer: Self-pay | Source: Ambulatory Visit | Attending: Cardiovascular Disease | Admitting: Cardiovascular Disease

## 2011-12-30 ENCOUNTER — Encounter (HOSPITAL_COMMUNITY): Payer: Self-pay

## 2012-01-02 ENCOUNTER — Encounter (HOSPITAL_COMMUNITY): Payer: Self-pay

## 2012-01-04 ENCOUNTER — Encounter (HOSPITAL_COMMUNITY)
Admission: RE | Admit: 2012-01-04 | Discharge: 2012-01-04 | Disposition: A | Discharge status: Home / Self Care | Payer: Self-pay | Source: Ambulatory Visit | Attending: Cardiovascular Disease | Admitting: Cardiovascular Disease

## 2012-01-06 ENCOUNTER — Encounter (HOSPITAL_COMMUNITY)
Admission: RE | Admit: 2012-01-06 | Discharge: 2012-01-06 | Disposition: A | Discharge status: Home / Self Care | Payer: Self-pay | Source: Ambulatory Visit | Attending: Cardiovascular Disease | Admitting: Cardiovascular Disease

## 2012-01-09 ENCOUNTER — Encounter (HOSPITAL_COMMUNITY)
Admission: RE | Admit: 2012-01-09 | Discharge: 2012-01-09 | Disposition: A | Discharge status: Home / Self Care | Payer: Self-pay | Source: Ambulatory Visit | Attending: Cardiovascular Disease | Admitting: Cardiovascular Disease

## 2012-01-09 DIAGNOSIS — Z5189 Encounter for other specified aftercare: Secondary | ICD-10-CM | POA: Insufficient documentation

## 2012-01-09 DIAGNOSIS — E119 Type 2 diabetes mellitus without complications: Secondary | ICD-10-CM | POA: Insufficient documentation

## 2012-01-09 DIAGNOSIS — E669 Obesity, unspecified: Secondary | ICD-10-CM | POA: Insufficient documentation

## 2012-01-09 DIAGNOSIS — I10 Essential (primary) hypertension: Secondary | ICD-10-CM | POA: Insufficient documentation

## 2012-01-09 DIAGNOSIS — E785 Hyperlipidemia, unspecified: Secondary | ICD-10-CM | POA: Insufficient documentation

## 2012-01-09 DIAGNOSIS — E039 Hypothyroidism, unspecified: Secondary | ICD-10-CM | POA: Insufficient documentation

## 2012-01-09 DIAGNOSIS — I2510 Atherosclerotic heart disease of native coronary artery without angina pectoris: Secondary | ICD-10-CM | POA: Insufficient documentation

## 2012-01-09 DIAGNOSIS — Z9861 Coronary angioplasty status: Secondary | ICD-10-CM | POA: Insufficient documentation

## 2012-01-09 DIAGNOSIS — I252 Old myocardial infarction: Secondary | ICD-10-CM | POA: Insufficient documentation

## 2012-01-11 ENCOUNTER — Encounter (HOSPITAL_COMMUNITY)
Admission: RE | Admit: 2012-01-11 | Discharge: 2012-01-11 | Disposition: A | Discharge status: Home / Self Care | Payer: Self-pay | Source: Ambulatory Visit | Attending: Cardiovascular Disease | Admitting: Cardiovascular Disease

## 2012-01-13 ENCOUNTER — Encounter (HOSPITAL_COMMUNITY)
Admission: RE | Admit: 2012-01-13 | Discharge: 2012-01-13 | Disposition: A | Discharge status: Home / Self Care | Payer: Self-pay | Source: Ambulatory Visit | Attending: Cardiovascular Disease | Admitting: Cardiovascular Disease

## 2012-01-16 ENCOUNTER — Encounter (HOSPITAL_COMMUNITY): Payer: Self-pay

## 2012-01-17 ENCOUNTER — Other Ambulatory Visit: Payer: Self-pay | Admitting: Internal Medicine

## 2012-01-18 ENCOUNTER — Encounter (HOSPITAL_COMMUNITY)
Admission: RE | Admit: 2012-01-18 | Discharge: 2012-01-18 | Disposition: A | Discharge status: Home / Self Care | Payer: Self-pay | Source: Ambulatory Visit | Attending: Cardiovascular Disease | Admitting: Cardiovascular Disease

## 2012-01-19 ENCOUNTER — Other Ambulatory Visit: Payer: Self-pay | Admitting: Cardiovascular Disease

## 2012-01-19 MED ORDER — CARVEDILOL 12.5 MG PO TABS: ORAL_TABLET | ORAL | Status: DC

## 2012-01-20 ENCOUNTER — Encounter (HOSPITAL_COMMUNITY)
Admission: RE | Admit: 2012-01-20 | Discharge: 2012-01-20 | Disposition: A | Discharge status: Home / Self Care | Payer: Self-pay | Source: Ambulatory Visit | Attending: Cardiovascular Disease | Admitting: Cardiovascular Disease

## 2012-01-23 ENCOUNTER — Encounter (HOSPITAL_COMMUNITY): Payer: Self-pay

## 2012-01-25 ENCOUNTER — Encounter (HOSPITAL_COMMUNITY)
Admission: RE | Admit: 2012-01-25 | Discharge: 2012-01-25 | Disposition: A | Discharge status: Home / Self Care | Payer: Self-pay | Source: Ambulatory Visit | Attending: Cardiovascular Disease | Admitting: Cardiovascular Disease

## 2012-01-25 ENCOUNTER — Ambulatory Visit: Payer: Medicare Other | Admitting: *Deleted

## 2012-01-27 ENCOUNTER — Encounter (HOSPITAL_COMMUNITY)
Admission: RE | Admit: 2012-01-27 | Discharge: 2012-01-27 | Disposition: A | Discharge status: Home / Self Care | Payer: Self-pay | Source: Ambulatory Visit | Attending: Cardiovascular Disease | Admitting: Cardiovascular Disease

## 2012-01-30 ENCOUNTER — Encounter (HOSPITAL_COMMUNITY)
Admission: RE | Admit: 2012-01-30 | Discharge: 2012-01-30 | Disposition: A | Discharge status: Home / Self Care | Payer: Self-pay | Source: Ambulatory Visit | Attending: Cardiovascular Disease | Admitting: Cardiovascular Disease

## 2012-01-31 ENCOUNTER — Other Ambulatory Visit: Payer: Self-pay | Admitting: Internal Medicine

## 2012-02-01 ENCOUNTER — Encounter (HOSPITAL_COMMUNITY)
Admission: RE | Admit: 2012-02-01 | Discharge: 2012-02-01 | Disposition: A | Discharge status: Home / Self Care | Payer: Self-pay | Source: Ambulatory Visit | Attending: Cardiovascular Disease | Admitting: Cardiovascular Disease

## 2012-02-03 ENCOUNTER — Encounter (HOSPITAL_COMMUNITY)
Admission: RE | Admit: 2012-02-03 | Discharge: 2012-02-03 | Disposition: A | Discharge status: Home / Self Care | Payer: Self-pay | Source: Ambulatory Visit | Attending: Cardiovascular Disease | Admitting: Cardiovascular Disease

## 2012-02-03 ENCOUNTER — Other Ambulatory Visit: Payer: Self-pay | Admitting: Cardiovascular Disease

## 2012-02-06 ENCOUNTER — Encounter (HOSPITAL_COMMUNITY): Payer: Self-pay

## 2012-02-08 ENCOUNTER — Encounter (HOSPITAL_COMMUNITY)
Admission: RE | Admit: 2012-02-08 | Discharge: 2012-02-08 | Disposition: A | Discharge status: Home / Self Care | Payer: Self-pay | Source: Ambulatory Visit | Attending: Cardiovascular Disease | Admitting: Cardiovascular Disease

## 2012-02-08 DIAGNOSIS — Z5189 Encounter for other specified aftercare: Secondary | ICD-10-CM | POA: Insufficient documentation

## 2012-02-08 DIAGNOSIS — E785 Hyperlipidemia, unspecified: Secondary | ICD-10-CM | POA: Insufficient documentation

## 2012-02-08 DIAGNOSIS — E669 Obesity, unspecified: Secondary | ICD-10-CM | POA: Insufficient documentation

## 2012-02-08 DIAGNOSIS — I2510 Atherosclerotic heart disease of native coronary artery without angina pectoris: Secondary | ICD-10-CM | POA: Insufficient documentation

## 2012-02-08 DIAGNOSIS — E039 Hypothyroidism, unspecified: Secondary | ICD-10-CM | POA: Insufficient documentation

## 2012-02-08 DIAGNOSIS — I252 Old myocardial infarction: Secondary | ICD-10-CM | POA: Insufficient documentation

## 2012-02-08 DIAGNOSIS — E119 Type 2 diabetes mellitus without complications: Secondary | ICD-10-CM | POA: Insufficient documentation

## 2012-02-08 DIAGNOSIS — I10 Essential (primary) hypertension: Secondary | ICD-10-CM | POA: Insufficient documentation

## 2012-02-08 DIAGNOSIS — Z9861 Coronary angioplasty status: Secondary | ICD-10-CM | POA: Insufficient documentation

## 2012-02-09 DIAGNOSIS — B079 Viral wart, unspecified: Secondary | ICD-10-CM | POA: Diagnosis not present

## 2012-02-10 ENCOUNTER — Encounter (HOSPITAL_COMMUNITY)
Admission: RE | Admit: 2012-02-10 | Discharge: 2012-02-10 | Disposition: A | Discharge status: Home / Self Care | Payer: Self-pay | Source: Ambulatory Visit | Attending: Cardiovascular Disease | Admitting: Cardiovascular Disease

## 2012-02-13 ENCOUNTER — Encounter (HOSPITAL_COMMUNITY): Payer: Self-pay

## 2012-02-15 ENCOUNTER — Encounter (HOSPITAL_COMMUNITY)
Admission: RE | Admit: 2012-02-15 | Discharge: 2012-02-15 | Disposition: A | Discharge status: Home / Self Care | Payer: Self-pay | Source: Ambulatory Visit | Attending: Cardiovascular Disease | Admitting: Cardiovascular Disease

## 2012-02-17 ENCOUNTER — Encounter (HOSPITAL_COMMUNITY): Payer: Self-pay

## 2012-02-20 ENCOUNTER — Encounter (HOSPITAL_COMMUNITY)
Admission: RE | Admit: 2012-02-20 | Discharge: 2012-02-20 | Disposition: A | Discharge status: Home / Self Care | Payer: Self-pay | Source: Ambulatory Visit | Attending: Cardiovascular Disease | Admitting: Cardiovascular Disease

## 2012-02-22 ENCOUNTER — Encounter (HOSPITAL_COMMUNITY)
Admission: RE | Admit: 2012-02-22 | Discharge: 2012-02-22 | Disposition: A | Discharge status: Home / Self Care | Payer: Self-pay | Source: Ambulatory Visit | Attending: Cardiovascular Disease | Admitting: Cardiovascular Disease

## 2012-02-23 ENCOUNTER — Other Ambulatory Visit: Payer: Self-pay | Admitting: Cardiovascular Disease

## 2012-02-23 NOTE — Telephone Encounter (Signed)
Refilled carvedilol, patient needs to call for appointment

## 2012-02-24 ENCOUNTER — Encounter (HOSPITAL_COMMUNITY)
Admission: RE | Admit: 2012-02-24 | Discharge: 2012-02-24 | Disposition: A | Discharge status: Home / Self Care | Payer: Self-pay | Source: Ambulatory Visit | Attending: Cardiovascular Disease | Admitting: Cardiovascular Disease

## 2012-02-27 ENCOUNTER — Encounter (HOSPITAL_COMMUNITY)
Admission: RE | Admit: 2012-02-27 | Discharge: 2012-02-27 | Disposition: A | Discharge status: Home / Self Care | Payer: Self-pay | Source: Ambulatory Visit | Attending: Cardiovascular Disease | Admitting: Cardiovascular Disease

## 2012-02-29 ENCOUNTER — Encounter (HOSPITAL_COMMUNITY)
Admission: RE | Admit: 2012-02-29 | Discharge: 2012-02-29 | Disposition: A | Discharge status: Home / Self Care | Payer: Self-pay | Source: Ambulatory Visit | Attending: Cardiovascular Disease | Admitting: Cardiovascular Disease

## 2012-03-02 ENCOUNTER — Encounter (HOSPITAL_COMMUNITY)
Admission: RE | Admit: 2012-03-02 | Discharge: 2012-03-02 | Disposition: A | Discharge status: Home / Self Care | Payer: Self-pay | Source: Ambulatory Visit | Attending: Cardiovascular Disease | Admitting: Cardiovascular Disease

## 2012-03-05 ENCOUNTER — Encounter (HOSPITAL_COMMUNITY): Payer: Self-pay

## 2012-03-07 ENCOUNTER — Encounter (HOSPITAL_COMMUNITY)
Admission: RE | Admit: 2012-03-07 | Discharge: 2012-03-07 | Disposition: A | Discharge status: Home / Self Care | Payer: Self-pay | Source: Ambulatory Visit | Attending: Cardiovascular Disease | Admitting: Cardiovascular Disease

## 2012-03-09 ENCOUNTER — Encounter (HOSPITAL_COMMUNITY)
Admission: RE | Admit: 2012-03-09 | Discharge: 2012-03-09 | Disposition: A | Discharge status: Home / Self Care | Payer: Self-pay | Source: Ambulatory Visit | Attending: Cardiovascular Disease | Admitting: Cardiovascular Disease

## 2012-03-12 ENCOUNTER — Encounter (HOSPITAL_COMMUNITY): Payer: Self-pay

## 2012-03-12 DIAGNOSIS — I2510 Atherosclerotic heart disease of native coronary artery without angina pectoris: Secondary | ICD-10-CM | POA: Insufficient documentation

## 2012-03-12 DIAGNOSIS — Z5189 Encounter for other specified aftercare: Secondary | ICD-10-CM | POA: Insufficient documentation

## 2012-03-12 DIAGNOSIS — I10 Essential (primary) hypertension: Secondary | ICD-10-CM | POA: Insufficient documentation

## 2012-03-12 DIAGNOSIS — I252 Old myocardial infarction: Secondary | ICD-10-CM | POA: Insufficient documentation

## 2012-03-12 DIAGNOSIS — Z9861 Coronary angioplasty status: Secondary | ICD-10-CM | POA: Insufficient documentation

## 2012-03-12 DIAGNOSIS — E785 Hyperlipidemia, unspecified: Secondary | ICD-10-CM | POA: Insufficient documentation

## 2012-03-12 DIAGNOSIS — E119 Type 2 diabetes mellitus without complications: Secondary | ICD-10-CM | POA: Insufficient documentation

## 2012-03-12 DIAGNOSIS — E039 Hypothyroidism, unspecified: Secondary | ICD-10-CM | POA: Insufficient documentation

## 2012-03-12 DIAGNOSIS — E669 Obesity, unspecified: Secondary | ICD-10-CM | POA: Insufficient documentation

## 2012-03-14 ENCOUNTER — Encounter (HOSPITAL_COMMUNITY)
Admission: RE | Admit: 2012-03-14 | Discharge: 2012-03-14 | Disposition: A | Discharge status: Home / Self Care | Payer: Self-pay | Source: Ambulatory Visit | Attending: Cardiovascular Disease | Admitting: Cardiovascular Disease

## 2012-03-16 ENCOUNTER — Encounter (HOSPITAL_COMMUNITY): Payer: Self-pay

## 2012-03-19 ENCOUNTER — Encounter (HOSPITAL_COMMUNITY)
Admission: RE | Admit: 2012-03-19 | Discharge: 2012-03-19 | Disposition: A | Discharge status: Home / Self Care | Payer: Self-pay | Source: Ambulatory Visit | Attending: Cardiovascular Disease | Admitting: Cardiovascular Disease

## 2012-03-21 ENCOUNTER — Encounter (HOSPITAL_COMMUNITY)
Admission: RE | Admit: 2012-03-21 | Discharge: 2012-03-21 | Disposition: A | Discharge status: Home / Self Care | Payer: Self-pay | Source: Ambulatory Visit | Attending: Cardiovascular Disease | Admitting: Cardiovascular Disease

## 2012-03-23 ENCOUNTER — Encounter (HOSPITAL_COMMUNITY)
Admission: RE | Admit: 2012-03-23 | Discharge: 2012-03-23 | Disposition: A | Discharge status: Home / Self Care | Payer: Self-pay | Source: Ambulatory Visit | Attending: Cardiovascular Disease | Admitting: Cardiovascular Disease

## 2012-03-26 ENCOUNTER — Encounter (HOSPITAL_COMMUNITY): Payer: Self-pay

## 2012-03-28 ENCOUNTER — Encounter (HOSPITAL_COMMUNITY)
Admission: RE | Admit: 2012-03-28 | Discharge: 2012-03-28 | Disposition: A | Discharge status: Home / Self Care | Payer: Self-pay | Source: Ambulatory Visit | Attending: Cardiovascular Disease | Admitting: Cardiovascular Disease

## 2012-03-30 ENCOUNTER — Encounter (HOSPITAL_COMMUNITY)
Admission: RE | Admit: 2012-03-30 | Discharge: 2012-03-30 | Disposition: A | Discharge status: Home / Self Care | Payer: Self-pay | Source: Ambulatory Visit | Attending: Cardiovascular Disease | Admitting: Cardiovascular Disease

## 2012-04-02 ENCOUNTER — Other Ambulatory Visit: Payer: Self-pay | Admitting: Cardiovascular Disease

## 2012-04-02 ENCOUNTER — Encounter (HOSPITAL_COMMUNITY)
Admission: RE | Admit: 2012-04-02 | Discharge: 2012-04-02 | Disposition: A | Discharge status: Home / Self Care | Payer: Self-pay | Source: Ambulatory Visit | Attending: Cardiovascular Disease | Admitting: Cardiovascular Disease

## 2012-04-02 ENCOUNTER — Other Ambulatory Visit: Payer: Self-pay | Admitting: Gastroenterology

## 2012-04-04 ENCOUNTER — Encounter (HOSPITAL_COMMUNITY): Payer: Self-pay

## 2012-04-06 ENCOUNTER — Encounter (HOSPITAL_COMMUNITY)
Admission: RE | Admit: 2012-04-06 | Discharge: 2012-04-06 | Disposition: A | Discharge status: Home / Self Care | Payer: Self-pay | Source: Ambulatory Visit | Attending: Cardiovascular Disease | Admitting: Cardiovascular Disease

## 2012-04-09 ENCOUNTER — Encounter (HOSPITAL_COMMUNITY)
Admission: RE | Admit: 2012-04-09 | Discharge: 2012-04-09 | Disposition: A | Discharge status: Home / Self Care | Payer: Self-pay | Source: Ambulatory Visit | Attending: Cardiovascular Disease | Admitting: Cardiovascular Disease

## 2012-04-09 DIAGNOSIS — I2510 Atherosclerotic heart disease of native coronary artery without angina pectoris: Secondary | ICD-10-CM | POA: Insufficient documentation

## 2012-04-09 DIAGNOSIS — E669 Obesity, unspecified: Secondary | ICD-10-CM | POA: Insufficient documentation

## 2012-04-09 DIAGNOSIS — Z5189 Encounter for other specified aftercare: Secondary | ICD-10-CM | POA: Insufficient documentation

## 2012-04-09 DIAGNOSIS — I252 Old myocardial infarction: Secondary | ICD-10-CM | POA: Insufficient documentation

## 2012-04-09 DIAGNOSIS — E785 Hyperlipidemia, unspecified: Secondary | ICD-10-CM | POA: Insufficient documentation

## 2012-04-09 DIAGNOSIS — E119 Type 2 diabetes mellitus without complications: Secondary | ICD-10-CM | POA: Insufficient documentation

## 2012-04-09 DIAGNOSIS — Z9861 Coronary angioplasty status: Secondary | ICD-10-CM | POA: Insufficient documentation

## 2012-04-09 DIAGNOSIS — E039 Hypothyroidism, unspecified: Secondary | ICD-10-CM | POA: Insufficient documentation

## 2012-04-09 DIAGNOSIS — I10 Essential (primary) hypertension: Secondary | ICD-10-CM | POA: Insufficient documentation

## 2012-04-11 ENCOUNTER — Encounter (HOSPITAL_COMMUNITY)
Admission: RE | Admit: 2012-04-11 | Discharge: 2012-04-11 | Disposition: A | Discharge status: Home / Self Care | Payer: Self-pay | Source: Ambulatory Visit | Attending: Cardiovascular Disease | Admitting: Cardiovascular Disease

## 2012-04-13 ENCOUNTER — Encounter (HOSPITAL_COMMUNITY)
Admission: RE | Admit: 2012-04-13 | Discharge: 2012-04-13 | Disposition: A | Discharge status: Home / Self Care | Payer: Self-pay | Source: Ambulatory Visit | Attending: Cardiovascular Disease | Admitting: Cardiovascular Disease

## 2012-04-16 ENCOUNTER — Encounter (HOSPITAL_COMMUNITY): Payer: Self-pay

## 2012-04-18 ENCOUNTER — Encounter (HOSPITAL_COMMUNITY)
Admission: RE | Admit: 2012-04-18 | Discharge: 2012-04-18 | Disposition: A | Discharge status: Home / Self Care | Payer: Self-pay | Source: Ambulatory Visit | Attending: Cardiovascular Disease | Admitting: Cardiovascular Disease

## 2012-04-19 ENCOUNTER — Other Ambulatory Visit: Payer: Self-pay | Admitting: Surgery

## 2012-04-19 DIAGNOSIS — B079 Viral wart, unspecified: Secondary | ICD-10-CM | POA: Diagnosis not present

## 2012-04-20 ENCOUNTER — Encounter (HOSPITAL_COMMUNITY): Payer: Self-pay

## 2012-04-23 ENCOUNTER — Encounter (HOSPITAL_COMMUNITY)
Admission: RE | Admit: 2012-04-23 | Discharge: 2012-04-23 | Disposition: A | Discharge status: Home / Self Care | Payer: Self-pay | Source: Ambulatory Visit | Attending: Cardiovascular Disease | Admitting: Cardiovascular Disease

## 2012-04-23 ENCOUNTER — Other Ambulatory Visit: Payer: Self-pay | Admitting: Cardiovascular Disease

## 2012-04-25 ENCOUNTER — Encounter (HOSPITAL_COMMUNITY)
Admission: RE | Admit: 2012-04-25 | Discharge: 2012-04-25 | Disposition: A | Discharge status: Home / Self Care | Payer: Self-pay | Source: Ambulatory Visit | Attending: Cardiovascular Disease | Admitting: Cardiovascular Disease

## 2012-04-27 ENCOUNTER — Encounter (HOSPITAL_COMMUNITY)
Admission: RE | Admit: 2012-04-27 | Discharge: 2012-04-27 | Disposition: A | Discharge status: Home / Self Care | Payer: Self-pay | Source: Ambulatory Visit | Attending: Cardiovascular Disease | Admitting: Cardiovascular Disease

## 2012-04-30 ENCOUNTER — Encounter (HOSPITAL_COMMUNITY)
Admission: RE | Admit: 2012-04-30 | Discharge: 2012-04-30 | Disposition: A | Discharge status: Home / Self Care | Payer: Self-pay | Source: Ambulatory Visit | Attending: Cardiovascular Disease | Admitting: Cardiovascular Disease

## 2012-05-01 ENCOUNTER — Other Ambulatory Visit (INDEPENDENT_AMBULATORY_CARE_PROVIDER_SITE_OTHER): Payer: Medicare Other

## 2012-05-01 ENCOUNTER — Encounter: Payer: Self-pay | Admitting: Gastroenterology

## 2012-05-01 ENCOUNTER — Ambulatory Visit (INDEPENDENT_AMBULATORY_CARE_PROVIDER_SITE_OTHER): Payer: Medicare Other | Admitting: Gastroenterology

## 2012-05-01 VITALS — BP 120/80 | HR 76 | Ht 69.5 in | Wt 311.0 lb

## 2012-05-01 DIAGNOSIS — K5289 Other specified noninfective gastroenteritis and colitis: Secondary | ICD-10-CM

## 2012-05-01 DIAGNOSIS — K5190 Ulcerative colitis, unspecified, without complications: Secondary | ICD-10-CM

## 2012-05-01 DIAGNOSIS — R6889 Other general symptoms and signs: Secondary | ICD-10-CM | POA: Diagnosis not present

## 2012-05-01 DIAGNOSIS — E66813 Obesity, class 3: Secondary | ICD-10-CM

## 2012-05-01 MED ORDER — MESALAMINE ER 0.375 G PO CP24: ORAL_CAPSULE | ORAL | Status: DC

## 2012-05-01 NOTE — Progress Notes (Signed)
This is a morbidly obese male with multiple cardiovascular issues throughout followed for many years because of idiopathic ulcerative colitis. Colonoscopy one year ago showed mild activity, but multiple biopsy showed no evidence of dysplasia. He is completely asymptomatic on oral aminosalicylates,Apriso 4 tablets a day. He denies upper GI, or hepatobiliary complaints. His appetite is good and his weight is stable. Review of his labs shows no specific abnormalities.   Current Medications, Allergies, Past Medical History, Past Surgical History, Family History and Social History were reviewed in [employer] record.  Pertinent Review of Systems Negative   Physical Exam: Blood pressure 120/80, pulse 76 and regular, and weight 311 pounds with a BMI of 45.27. I cannot appreciate stigmata of chronic liver disease. His chest is clear and he appears to be in a regular rhythm without murmurs gallops or rubs. Has a massively obese abdomen with a ventral hernia present. I cannot appreciate definite organomegaly, other masses or tenderness. Bowel sounds are normal. Mental status is normal.    Assessment and Plan: Continue Apriso 4 tablets a day with when necessary Imodium as needed. He is due for colonoscopy in 2 years time. He is to call if he has a flare of his disease process. Serum B12 level a day was ordered for review. Encounter Diagnoses  Name Primary?  . Other and unspecified noninfectious gastroenteritis and colitis Yes  . Other general symptoms

## 2012-05-01 NOTE — Patient Instructions (Addendum)
We have sent the following medications to your pharmacy for you to pick up at your convenience: [facility] has requested that you go to the basement for the following lab work before leaving today: B12 level CC: Dr Phoebe Sharps

## 2012-05-02 ENCOUNTER — Encounter (HOSPITAL_COMMUNITY)
Admission: RE | Admit: 2012-05-02 | Discharge: 2012-05-02 | Disposition: A | Discharge status: Home / Self Care | Payer: Self-pay | Source: Ambulatory Visit | Attending: Cardiovascular Disease | Admitting: Cardiovascular Disease

## 2012-05-02 ENCOUNTER — Other Ambulatory Visit: Payer: Self-pay | Admitting: Cardiovascular Disease

## 2012-05-02 ENCOUNTER — Other Ambulatory Visit: Payer: Self-pay | Admitting: Internal Medicine

## 2012-05-04 ENCOUNTER — Encounter (HOSPITAL_COMMUNITY)
Admission: RE | Admit: 2012-05-04 | Discharge: 2012-05-04 | Disposition: A | Discharge status: Home / Self Care | Payer: Self-pay | Source: Ambulatory Visit | Attending: Cardiovascular Disease | Admitting: Cardiovascular Disease

## 2012-05-07 ENCOUNTER — Encounter (HOSPITAL_COMMUNITY)
Admission: RE | Admit: 2012-05-07 | Discharge: 2012-05-07 | Disposition: A | Discharge status: Home / Self Care | Payer: Self-pay | Source: Ambulatory Visit | Attending: Cardiovascular Disease | Admitting: Cardiovascular Disease

## 2012-05-09 ENCOUNTER — Encounter (HOSPITAL_COMMUNITY): Payer: Self-pay

## 2012-05-11 ENCOUNTER — Encounter (HOSPITAL_COMMUNITY)
Admission: RE | Admit: 2012-05-11 | Discharge: 2012-05-11 | Disposition: A | Discharge status: Home / Self Care | Payer: Self-pay | Source: Ambulatory Visit | Attending: Cardiovascular Disease | Admitting: Cardiovascular Disease

## 2012-05-11 DIAGNOSIS — E785 Hyperlipidemia, unspecified: Secondary | ICD-10-CM | POA: Insufficient documentation

## 2012-05-11 DIAGNOSIS — I10 Essential (primary) hypertension: Secondary | ICD-10-CM | POA: Insufficient documentation

## 2012-05-11 DIAGNOSIS — Z5189 Encounter for other specified aftercare: Secondary | ICD-10-CM | POA: Insufficient documentation

## 2012-05-11 DIAGNOSIS — E119 Type 2 diabetes mellitus without complications: Secondary | ICD-10-CM | POA: Insufficient documentation

## 2012-05-11 DIAGNOSIS — E039 Hypothyroidism, unspecified: Secondary | ICD-10-CM | POA: Insufficient documentation

## 2012-05-11 DIAGNOSIS — I2510 Atherosclerotic heart disease of native coronary artery without angina pectoris: Secondary | ICD-10-CM | POA: Insufficient documentation

## 2012-05-11 DIAGNOSIS — E669 Obesity, unspecified: Secondary | ICD-10-CM | POA: Insufficient documentation

## 2012-05-11 DIAGNOSIS — I252 Old myocardial infarction: Secondary | ICD-10-CM | POA: Insufficient documentation

## 2012-05-11 DIAGNOSIS — Z9861 Coronary angioplasty status: Secondary | ICD-10-CM | POA: Insufficient documentation

## 2012-05-14 ENCOUNTER — Encounter (HOSPITAL_COMMUNITY)
Admission: RE | Admit: 2012-05-14 | Discharge: 2012-05-14 | Disposition: A | Discharge status: Home / Self Care | Payer: Self-pay | Source: Ambulatory Visit | Attending: Cardiovascular Disease | Admitting: Cardiovascular Disease

## 2012-05-15 DIAGNOSIS — H2510 Age-related nuclear cataract, unspecified eye: Secondary | ICD-10-CM | POA: Diagnosis not present

## 2012-05-15 DIAGNOSIS — E119 Type 2 diabetes mellitus without complications: Secondary | ICD-10-CM | POA: Diagnosis not present

## 2012-05-15 LAB — HM DIABETES EYE EXAM

## 2012-05-16 ENCOUNTER — Encounter (HOSPITAL_COMMUNITY)
Admission: RE | Admit: 2012-05-16 | Discharge: 2012-05-16 | Disposition: A | Discharge status: Home / Self Care | Payer: Self-pay | Source: Ambulatory Visit | Attending: Cardiovascular Disease | Admitting: Cardiovascular Disease

## 2012-05-16 ENCOUNTER — Ambulatory Visit (INDEPENDENT_AMBULATORY_CARE_PROVIDER_SITE_OTHER): Payer: Medicare Other | Admitting: Gastroenterology

## 2012-05-16 DIAGNOSIS — E538 Deficiency of other specified B group vitamins: Secondary | ICD-10-CM

## 2012-05-16 MED ORDER — CYANOCOBALAMIN 1000 MCG/ML IJ SOLN
1000.0000 ug | Freq: Once | INTRAMUSCULAR | Status: AC
  Administered 2012-05-16: 1000 ug via INTRAMUSCULAR

## 2012-05-18 ENCOUNTER — Encounter (HOSPITAL_COMMUNITY)
Admission: RE | Admit: 2012-05-18 | Discharge: 2012-05-18 | Disposition: A | Discharge status: Home / Self Care | Payer: Self-pay | Source: Ambulatory Visit | Attending: Cardiovascular Disease | Admitting: Cardiovascular Disease

## 2012-05-21 ENCOUNTER — Encounter (HOSPITAL_COMMUNITY)
Admission: RE | Admit: 2012-05-21 | Discharge: 2012-05-21 | Disposition: A | Discharge status: Home / Self Care | Payer: Self-pay | Source: Ambulatory Visit | Attending: Cardiovascular Disease | Admitting: Cardiovascular Disease

## 2012-05-21 DIAGNOSIS — B079 Viral wart, unspecified: Secondary | ICD-10-CM | POA: Diagnosis not present

## 2012-05-23 ENCOUNTER — Encounter (HOSPITAL_COMMUNITY)
Admission: RE | Admit: 2012-05-23 | Discharge: 2012-05-23 | Disposition: A | Discharge status: Home / Self Care | Payer: Self-pay | Source: Ambulatory Visit | Attending: Cardiovascular Disease | Admitting: Cardiovascular Disease

## 2012-05-23 ENCOUNTER — Ambulatory Visit (INDEPENDENT_AMBULATORY_CARE_PROVIDER_SITE_OTHER): Payer: Medicare Other | Admitting: Gastroenterology

## 2012-05-23 DIAGNOSIS — E538 Deficiency of other specified B group vitamins: Secondary | ICD-10-CM

## 2012-05-25 ENCOUNTER — Encounter (HOSPITAL_COMMUNITY)
Admission: RE | Admit: 2012-05-25 | Discharge: 2012-05-25 | Disposition: A | Discharge status: Home / Self Care | Payer: Self-pay | Source: Ambulatory Visit | Attending: Cardiovascular Disease | Admitting: Cardiovascular Disease

## 2012-05-28 ENCOUNTER — Encounter (HOSPITAL_COMMUNITY): Payer: Self-pay

## 2012-05-30 ENCOUNTER — Ambulatory Visit (INDEPENDENT_AMBULATORY_CARE_PROVIDER_SITE_OTHER): Payer: Medicare Other | Admitting: Gastroenterology

## 2012-05-30 ENCOUNTER — Encounter (HOSPITAL_COMMUNITY)
Admission: RE | Admit: 2012-05-30 | Discharge: 2012-05-30 | Disposition: A | Discharge status: Home / Self Care | Payer: Self-pay | Source: Ambulatory Visit | Attending: Cardiovascular Disease | Admitting: Cardiovascular Disease

## 2012-05-30 DIAGNOSIS — E538 Deficiency of other specified B group vitamins: Secondary | ICD-10-CM

## 2012-06-01 ENCOUNTER — Encounter (HOSPITAL_COMMUNITY)
Admission: RE | Admit: 2012-06-01 | Discharge: 2012-06-01 | Disposition: A | Discharge status: Home / Self Care | Payer: Self-pay | Source: Ambulatory Visit | Attending: Cardiovascular Disease | Admitting: Cardiovascular Disease

## 2012-06-02 ENCOUNTER — Other Ambulatory Visit: Payer: Self-pay | Admitting: Internal Medicine

## 2012-06-04 ENCOUNTER — Encounter (HOSPITAL_COMMUNITY)
Admission: RE | Admit: 2012-06-04 | Discharge: 2012-06-04 | Disposition: A | Discharge status: Home / Self Care | Payer: Self-pay | Source: Ambulatory Visit | Attending: Cardiovascular Disease | Admitting: Cardiovascular Disease

## 2012-06-06 ENCOUNTER — Encounter (HOSPITAL_COMMUNITY)
Admission: RE | Admit: 2012-06-06 | Discharge: 2012-06-06 | Disposition: A | Discharge status: Home / Self Care | Payer: Self-pay | Source: Ambulatory Visit | Attending: Cardiovascular Disease | Admitting: Cardiovascular Disease

## 2012-06-08 ENCOUNTER — Encounter (HOSPITAL_COMMUNITY)
Admission: RE | Admit: 2012-06-08 | Discharge: 2012-06-08 | Disposition: A | Discharge status: Home / Self Care | Payer: Self-pay | Source: Ambulatory Visit | Attending: Cardiovascular Disease | Admitting: Cardiovascular Disease

## 2012-06-11 ENCOUNTER — Encounter (HOSPITAL_COMMUNITY): Payer: Self-pay

## 2012-06-13 ENCOUNTER — Encounter (HOSPITAL_COMMUNITY)
Admission: RE | Admit: 2012-06-13 | Discharge: 2012-06-13 | Disposition: A | Discharge status: Home / Self Care | Payer: Self-pay | Source: Ambulatory Visit | Attending: Cardiovascular Disease | Admitting: Cardiovascular Disease

## 2012-06-13 DIAGNOSIS — E119 Type 2 diabetes mellitus without complications: Secondary | ICD-10-CM | POA: Insufficient documentation

## 2012-06-13 DIAGNOSIS — E785 Hyperlipidemia, unspecified: Secondary | ICD-10-CM | POA: Insufficient documentation

## 2012-06-13 DIAGNOSIS — I252 Old myocardial infarction: Secondary | ICD-10-CM | POA: Insufficient documentation

## 2012-06-13 DIAGNOSIS — E669 Obesity, unspecified: Secondary | ICD-10-CM | POA: Insufficient documentation

## 2012-06-13 DIAGNOSIS — Z9861 Coronary angioplasty status: Secondary | ICD-10-CM | POA: Insufficient documentation

## 2012-06-13 DIAGNOSIS — Z5189 Encounter for other specified aftercare: Secondary | ICD-10-CM | POA: Insufficient documentation

## 2012-06-13 DIAGNOSIS — E039 Hypothyroidism, unspecified: Secondary | ICD-10-CM | POA: Insufficient documentation

## 2012-06-13 DIAGNOSIS — I2510 Atherosclerotic heart disease of native coronary artery without angina pectoris: Secondary | ICD-10-CM | POA: Insufficient documentation

## 2012-06-13 DIAGNOSIS — I10 Essential (primary) hypertension: Secondary | ICD-10-CM | POA: Insufficient documentation

## 2012-06-15 ENCOUNTER — Encounter (HOSPITAL_COMMUNITY)
Admission: RE | Admit: 2012-06-15 | Discharge: 2012-06-15 | Disposition: A | Discharge status: Home / Self Care | Payer: Self-pay | Source: Ambulatory Visit | Attending: Cardiovascular Disease | Admitting: Cardiovascular Disease

## 2012-06-18 ENCOUNTER — Encounter (HOSPITAL_COMMUNITY)
Admission: RE | Admit: 2012-06-18 | Discharge: 2012-06-18 | Disposition: A | Discharge status: Home / Self Care | Payer: Self-pay | Source: Ambulatory Visit | Attending: Cardiovascular Disease | Admitting: Cardiovascular Disease

## 2012-06-20 ENCOUNTER — Encounter (HOSPITAL_COMMUNITY): Payer: Self-pay

## 2012-06-22 ENCOUNTER — Encounter (HOSPITAL_COMMUNITY)
Admission: RE | Admit: 2012-06-22 | Discharge: 2012-06-22 | Disposition: A | Discharge status: Home / Self Care | Payer: Self-pay | Source: Ambulatory Visit | Attending: Cardiovascular Disease | Admitting: Cardiovascular Disease

## 2012-06-25 ENCOUNTER — Ambulatory Visit (INDEPENDENT_AMBULATORY_CARE_PROVIDER_SITE_OTHER): Payer: Medicare Other | Admitting: Internal Medicine

## 2012-06-25 ENCOUNTER — Encounter (HOSPITAL_COMMUNITY): Payer: Self-pay

## 2012-06-25 ENCOUNTER — Encounter: Payer: Self-pay | Admitting: Internal Medicine

## 2012-06-25 VITALS — BP 136/90 | Temp 98.4°F | Wt 313.0 lb

## 2012-06-25 DIAGNOSIS — Z23 Encounter for immunization: Secondary | ICD-10-CM | POA: Diagnosis not present

## 2012-06-25 DIAGNOSIS — J309 Allergic rhinitis, unspecified: Secondary | ICD-10-CM

## 2012-06-25 MED ORDER — FEXOFENADINE HCL 180 MG PO TABS: 180.0000 mg | ORAL_TABLET | Freq: Every day | ORAL | Status: DC

## 2012-06-25 MED ORDER — MOMETASONE FUROATE 50 MCG/ACT NA SUSP: NASAL | Status: DC

## 2012-06-25 NOTE — Assessment & Plan Note (Signed)
66 year old white male with signs and symptoms of allergic rhinitis. Treat with over-the-counter fexofenadine 180 mg once daily. Also use Nasonex 2 sprays each nostril once daily. Patient advised to call office if symptoms persist or worsen.

## 2012-06-25 NOTE — Progress Notes (Signed)
Subjective:    Patient ID: [name], male    DOB: Jun 26, 1946, 66 y.o.   MRN: [id number]  HPI  65 year old white male with history of type 2 diabetes, hypertension, and coronary artery disease complains of runny eyes and runny nose for the last 2 days. Patient states that he has been sneezing all night. He denies any fever. He denies any pain or pressure in his face/sinuses.  Review of Systems    negative for cough or shortness of breath  Past Medical History  Diagnosis Date  . CORONARY ARTERY DISEASE 04/12/2007  . DIABETES MELLITUS, TYPE II 04/16/2007  . HYPERLIPIDEMIA 04/16/2007  . HYPERTENSION 04/12/2007  . HYPOTHYROIDISM 04/12/2007  . MYOCARDIAL INFARCTION, HX OF 04/12/2007  . OBESITY 06/16/2009  . SLEEP APNEA 04/12/2007  . ULCERATIVE COLITIS, LEFT SIDED 11/23/2010  . Ischemic cardiomyopathy     cath 35-40%  . Atrial fibrillation     initial diagnoses 2012  . B12 deficiency     per patient    History   Social History  . Marital Status: Married    Spouse Name: N/A    Number of Children: 1  . Years of Education: N/A   Occupational History  . RETIRED    Social History Main Topics  . Smoking status: Former Smoker    Types: Cigarettes    Quit date: 04/14/1967  . Smokeless tobacco: Never Used  . Alcohol Use: Yes     social, beer once a month or two - light  . Drug Use: No  . Sexually Active: Not on file   Other Topics Concern  . Not on file   Social History Narrative   PT DOES NOT EXERCISE ON A REGULAR BASIS.Marland KitchenDAILY CAFFEINE USE.GRANDDAUGHTER (MS. PETTIGREW) IS AN RN ON 2000 @ [facility]    Past Surgical History  Procedure Date  . Coronary stent placement 2008    LAD   . Cardiac catheterization 10/2002    STENT  . Tonsillectomy     Family History  Problem Relation Age of Onset  . Heart attack Mother   . Heart disease Father     H/O CAD, CABG, VALVE SURGERY  . Hypertension Brother   . Obesity Brother   . Stomach cancer Maternal Aunt   . Stomach cancer Paternal  Grandmother     ? colon     No Known Allergies  Current Outpatient Prescriptions on File Prior to Visit  Medication Sig Dispense Refill  . amLODipine (NORVASC) 10 MG tablet TAKE ONE (1) TABLET BY MOUTH EVERY      DAY  30 tablet  2  . aspirin 81 MG EC tablet Take 81 mg by mouth daily.        Marland Kitchen BENICAR 40 MG tablet TAKE ONE (1) TABLET EACH DAY  30 tablet  5  . carvedilol (COREG) 12.5 MG tablet Take one tablet (12.5 mg) every afternoon.  30 tablet  6  . carvedilol (COREG) 25 MG tablet TAKE ONE TABLET TWICE DAILY WITH A MEAL  60 tablet  3  . clopidogrel (PLAVIX) 75 MG tablet TAKE ONE (1) TABLET EACH DAY  30 tablet  5  . econazole nitrate 1 % cream       . fish oil-omega-3 fatty acids 1000 MG capsule Take 2 g by mouth daily.       . furosemide (LASIX) 40 MG tablet TAKE ONE TABLET ONCE DAILY  30 tablet  3  . Glucosamine-Chondroitin (OSTEO BI-FLEX REGULAR STRENGTH) 250-200 MG TABS Take  1 tablet by mouth 2 (two) times daily.       Marland Kitchen glyBURIDE (DIABETA) 2.5 MG tablet TAKE ONE (1) TABLET BY MOUTH EVERY      DAY  90 tablet  1  . JANUVIA 100 MG tablet TAKE ONE (1) TABLET EACH DAY  90 tablet  1  . KLOR-CON M20 20 MEQ tablet TAKE ONE (1) TABLET BY MOUTH EVERY      DAY  90 tablet  0  . mesalamine (APRISO) 0.375 G 24 hr capsule Take 4 capsules by mouth once daily.  120 capsule  3  . metFORMIN (GLUCOPHAGE) 500 MG tablet TAKE ONE (1) TABLET EACH DAY  60 tablet  5  . ONE TOUCH ULTRA TEST test strip       . PRADAXA 150 MG CAPS TAKE ONE CAPSULE BY MOUTH TWICE A DAY  60 capsule  11  . SYNTHROID 50 MCG tablet TAKE ONE (1) TABLET BY MOUTH EVERY      DAY  90 tablet  1  . triamcinolone (KENALOG) 0.5 % cream Apply 1 application topically as needed. Apply bid to affected area      . fexofenadine (ALLEGRA) 180 MG tablet Take 1 tablet (180 mg total) by mouth daily.      . mometasone (NASONEX) 50 MCG/ACT nasal spray Two sprays into each nostril in the morning once daily  17 g  1   Current Facility-Administered  Medications on File Prior to Visit  Medication Dose Route Frequency Provider Last Rate Last Dose  . cyanocobalamin ((VITAMIN B-12)) injection 1,000 mcg  1,000 mcg Intramuscular Q30 days Lisabeth Pick, MD   1,000 mcg at 05/30/12 1127    BP 136/90  Temp 98.4 F (36.9 C) (Oral)  Wt 313 lb (141.976 kg)    Objective:   Physical Exam  Constitutional: He appears well-developed and well-nourished.  HENT:  Head: Normocephalic and atraumatic.  Right Ear: External ear normal.  Mouth/Throat: Oropharynx is clear and moist.  Eyes:       Mild bilateral conjunctival injection  Neck: Neck supple.  Cardiovascular: Normal rate, regular rhythm and normal heart sounds.   Pulmonary/Chest: Effort normal and breath sounds normal. He has no wheezes.  Lymphadenopathy:    He has no cervical adenopathy.  Skin: Skin is warm and dry.          Assessment & Plan:

## 2012-06-27 ENCOUNTER — Encounter (HOSPITAL_COMMUNITY): Payer: Self-pay

## 2012-06-29 ENCOUNTER — Encounter (HOSPITAL_COMMUNITY)
Admission: RE | Admit: 2012-06-29 | Discharge: 2012-06-29 | Disposition: A | Discharge status: Home / Self Care | Payer: Self-pay | Source: Ambulatory Visit | Attending: Cardiovascular Disease | Admitting: Cardiovascular Disease

## 2012-07-02 ENCOUNTER — Encounter (HOSPITAL_COMMUNITY)
Admission: RE | Admit: 2012-07-02 | Discharge: 2012-07-02 | Disposition: A | Discharge status: Home / Self Care | Payer: Self-pay | Source: Ambulatory Visit | Attending: Cardiovascular Disease | Admitting: Cardiovascular Disease

## 2012-07-03 ENCOUNTER — Other Ambulatory Visit: Payer: Self-pay | Admitting: Internal Medicine

## 2012-07-04 ENCOUNTER — Encounter (HOSPITAL_COMMUNITY)
Admission: RE | Admit: 2012-07-04 | Discharge: 2012-07-04 | Disposition: A | Discharge status: Home / Self Care | Payer: Self-pay | Source: Ambulatory Visit | Attending: Cardiovascular Disease | Admitting: Cardiovascular Disease

## 2012-07-05 ENCOUNTER — Other Ambulatory Visit: Payer: Self-pay | Admitting: *Deleted

## 2012-07-05 MED ORDER — METFORMIN HCL 500 MG PO TABS: 500.0000 mg | ORAL_TABLET | Freq: Every day | ORAL | Status: DC

## 2012-07-05 MED ORDER — GLYBURIDE 2.5 MG PO TABS: 2.5000 mg | ORAL_TABLET | Freq: Every day | ORAL | Status: DC

## 2012-07-06 ENCOUNTER — Ambulatory Visit (INDEPENDENT_AMBULATORY_CARE_PROVIDER_SITE_OTHER): Payer: Medicare Other | Admitting: Gastroenterology

## 2012-07-06 ENCOUNTER — Encounter (HOSPITAL_COMMUNITY)
Admission: RE | Admit: 2012-07-06 | Discharge: 2012-07-06 | Disposition: A | Discharge status: Home / Self Care | Payer: Self-pay | Source: Ambulatory Visit | Attending: Cardiovascular Disease | Admitting: Cardiovascular Disease

## 2012-07-06 DIAGNOSIS — E538 Deficiency of other specified B group vitamins: Secondary | ICD-10-CM

## 2012-07-06 MED ORDER — CYANOCOBALAMIN 1000 MCG/ML IJ SOLN
1000.0000 ug | Freq: Once | INTRAMUSCULAR | Status: AC
  Administered 2012-07-06: 1000 ug via INTRAMUSCULAR

## 2012-07-09 ENCOUNTER — Encounter (HOSPITAL_COMMUNITY)
Admission: RE | Admit: 2012-07-09 | Discharge: 2012-07-09 | Disposition: A | Discharge status: Home / Self Care | Payer: Self-pay | Source: Ambulatory Visit | Attending: Cardiovascular Disease | Admitting: Cardiovascular Disease

## 2012-07-11 ENCOUNTER — Encounter (HOSPITAL_COMMUNITY)
Admission: RE | Admit: 2012-07-11 | Discharge: 2012-07-11 | Disposition: A | Discharge status: Home / Self Care | Payer: Self-pay | Source: Ambulatory Visit | Attending: Cardiovascular Disease | Admitting: Cardiovascular Disease

## 2012-07-11 DIAGNOSIS — Z5189 Encounter for other specified aftercare: Secondary | ICD-10-CM | POA: Insufficient documentation

## 2012-07-11 DIAGNOSIS — I10 Essential (primary) hypertension: Secondary | ICD-10-CM | POA: Insufficient documentation

## 2012-07-11 DIAGNOSIS — I252 Old myocardial infarction: Secondary | ICD-10-CM | POA: Insufficient documentation

## 2012-07-11 DIAGNOSIS — Z9861 Coronary angioplasty status: Secondary | ICD-10-CM | POA: Insufficient documentation

## 2012-07-11 DIAGNOSIS — E119 Type 2 diabetes mellitus without complications: Secondary | ICD-10-CM | POA: Insufficient documentation

## 2012-07-11 DIAGNOSIS — E785 Hyperlipidemia, unspecified: Secondary | ICD-10-CM | POA: Insufficient documentation

## 2012-07-11 DIAGNOSIS — I2510 Atherosclerotic heart disease of native coronary artery without angina pectoris: Secondary | ICD-10-CM | POA: Insufficient documentation

## 2012-07-11 DIAGNOSIS — E039 Hypothyroidism, unspecified: Secondary | ICD-10-CM | POA: Insufficient documentation

## 2012-07-11 DIAGNOSIS — E669 Obesity, unspecified: Secondary | ICD-10-CM | POA: Insufficient documentation

## 2012-07-13 ENCOUNTER — Encounter (HOSPITAL_COMMUNITY)
Admission: RE | Admit: 2012-07-13 | Discharge: 2012-07-13 | Disposition: A | Discharge status: Home / Self Care | Payer: Self-pay | Source: Ambulatory Visit | Attending: Cardiovascular Disease | Admitting: Cardiovascular Disease

## 2012-07-16 ENCOUNTER — Encounter (HOSPITAL_COMMUNITY)
Admission: RE | Admit: 2012-07-16 | Discharge: 2012-07-16 | Disposition: A | Discharge status: Home / Self Care | Payer: Self-pay | Source: Ambulatory Visit | Attending: Cardiovascular Disease | Admitting: Cardiovascular Disease

## 2012-07-18 ENCOUNTER — Encounter (HOSPITAL_COMMUNITY): Payer: Self-pay

## 2012-07-20 ENCOUNTER — Encounter (HOSPITAL_COMMUNITY)
Admission: RE | Admit: 2012-07-20 | Discharge: 2012-07-20 | Disposition: A | Discharge status: Home / Self Care | Payer: Self-pay | Source: Ambulatory Visit | Attending: Cardiovascular Disease | Admitting: Cardiovascular Disease

## 2012-07-23 ENCOUNTER — Encounter (HOSPITAL_COMMUNITY)
Admission: RE | Admit: 2012-07-23 | Discharge: 2012-07-23 | Disposition: A | Discharge status: Home / Self Care | Payer: Self-pay | Source: Ambulatory Visit | Attending: Cardiovascular Disease | Admitting: Cardiovascular Disease

## 2012-07-25 ENCOUNTER — Encounter (HOSPITAL_COMMUNITY)
Admission: RE | Admit: 2012-07-25 | Discharge: 2012-07-25 | Disposition: A | Discharge status: Home / Self Care | Payer: Self-pay | Source: Ambulatory Visit | Attending: Cardiovascular Disease | Admitting: Cardiovascular Disease

## 2012-07-27 ENCOUNTER — Encounter (HOSPITAL_COMMUNITY)
Admission: RE | Admit: 2012-07-27 | Discharge: 2012-07-27 | Disposition: A | Discharge status: Home / Self Care | Payer: Self-pay | Source: Ambulatory Visit | Attending: Cardiovascular Disease | Admitting: Cardiovascular Disease

## 2012-07-30 ENCOUNTER — Encounter (HOSPITAL_COMMUNITY)
Admission: RE | Admit: 2012-07-30 | Discharge: 2012-07-30 | Disposition: A | Discharge status: Home / Self Care | Payer: Self-pay | Source: Ambulatory Visit | Attending: Cardiovascular Disease | Admitting: Cardiovascular Disease

## 2012-07-30 DIAGNOSIS — B079 Viral wart, unspecified: Secondary | ICD-10-CM | POA: Diagnosis not present

## 2012-08-01 ENCOUNTER — Encounter (HOSPITAL_COMMUNITY)
Admission: RE | Admit: 2012-08-01 | Discharge: 2012-08-01 | Disposition: A | Discharge status: Home / Self Care | Payer: Self-pay | Source: Ambulatory Visit | Attending: Cardiovascular Disease | Admitting: Cardiovascular Disease

## 2012-08-01 ENCOUNTER — Other Ambulatory Visit: Payer: Self-pay | Admitting: Cardiovascular Disease

## 2012-08-01 ENCOUNTER — Other Ambulatory Visit: Payer: Self-pay | Admitting: Internal Medicine

## 2012-08-03 ENCOUNTER — Encounter (HOSPITAL_COMMUNITY)
Admission: RE | Admit: 2012-08-03 | Discharge: 2012-08-03 | Disposition: A | Discharge status: Home / Self Care | Payer: Self-pay | Source: Ambulatory Visit | Attending: Cardiovascular Disease | Admitting: Cardiovascular Disease

## 2012-08-06 ENCOUNTER — Encounter (HOSPITAL_COMMUNITY)
Admission: RE | Admit: 2012-08-06 | Discharge: 2012-08-06 | Disposition: A | Discharge status: Home / Self Care | Payer: Self-pay | Source: Ambulatory Visit | Attending: Cardiovascular Disease | Admitting: Cardiovascular Disease

## 2012-08-07 ENCOUNTER — Encounter: Payer: Self-pay | Admitting: Cardiovascular Disease

## 2012-08-07 NOTE — Telephone Encounter (Signed)
New Problem:     Called in wanting to know if we received the release for the patient to be switched over to [facility] for his cardiac rehab.  Please call back.

## 2012-08-07 NOTE — Telephone Encounter (Signed)
This encounter was created in error - please disregard.

## 2012-08-08 ENCOUNTER — Encounter (HOSPITAL_COMMUNITY)
Admission: RE | Admit: 2012-08-08 | Discharge: 2012-08-08 | Disposition: A | Discharge status: Home / Self Care | Payer: Self-pay | Source: Ambulatory Visit | Attending: Cardiovascular Disease | Admitting: Cardiovascular Disease

## 2012-08-09 ENCOUNTER — Encounter (HOSPITAL_COMMUNITY)
Admission: RE | Admit: 2012-08-09 | Discharge: 2012-08-09 | Disposition: A | Discharge status: Home / Self Care | Payer: Self-pay | Source: Ambulatory Visit | Attending: Cardiovascular Disease | Admitting: Cardiovascular Disease

## 2012-08-10 ENCOUNTER — Encounter (HOSPITAL_COMMUNITY): Payer: Medicare Other

## 2012-08-10 ENCOUNTER — Encounter (HOSPITAL_COMMUNITY)
Admission: RE | Admit: 2012-08-10 | Discharge: 2012-08-10 | Disposition: A | Discharge status: Home / Self Care | Payer: Self-pay | Source: Ambulatory Visit | Attending: Cardiovascular Disease | Admitting: Cardiovascular Disease

## 2012-08-10 DIAGNOSIS — E039 Hypothyroidism, unspecified: Secondary | ICD-10-CM | POA: Insufficient documentation

## 2012-08-10 DIAGNOSIS — E669 Obesity, unspecified: Secondary | ICD-10-CM | POA: Insufficient documentation

## 2012-08-10 DIAGNOSIS — Z5189 Encounter for other specified aftercare: Secondary | ICD-10-CM | POA: Insufficient documentation

## 2012-08-10 DIAGNOSIS — E119 Type 2 diabetes mellitus without complications: Secondary | ICD-10-CM | POA: Insufficient documentation

## 2012-08-10 DIAGNOSIS — I2510 Atherosclerotic heart disease of native coronary artery without angina pectoris: Secondary | ICD-10-CM | POA: Insufficient documentation

## 2012-08-10 DIAGNOSIS — E785 Hyperlipidemia, unspecified: Secondary | ICD-10-CM | POA: Insufficient documentation

## 2012-08-10 DIAGNOSIS — I252 Old myocardial infarction: Secondary | ICD-10-CM | POA: Insufficient documentation

## 2012-08-10 DIAGNOSIS — I10 Essential (primary) hypertension: Secondary | ICD-10-CM | POA: Insufficient documentation

## 2012-08-10 DIAGNOSIS — Z9861 Coronary angioplasty status: Secondary | ICD-10-CM | POA: Insufficient documentation

## 2012-08-13 ENCOUNTER — Encounter (HOSPITAL_COMMUNITY)
Admission: RE | Admit: 2012-08-13 | Discharge: 2012-08-13 | Disposition: A | Discharge status: Home / Self Care | Payer: Self-pay | Source: Ambulatory Visit | Attending: Cardiovascular Disease | Admitting: Cardiovascular Disease

## 2012-08-13 ENCOUNTER — Encounter (HOSPITAL_COMMUNITY): Payer: Medicare Other

## 2012-08-15 ENCOUNTER — Encounter (HOSPITAL_COMMUNITY)
Admission: RE | Admit: 2012-08-15 | Discharge: 2012-08-15 | Disposition: A | Discharge status: Home / Self Care | Payer: Self-pay | Source: Ambulatory Visit | Attending: Cardiovascular Disease | Admitting: Cardiovascular Disease

## 2012-08-15 ENCOUNTER — Encounter (HOSPITAL_COMMUNITY): Payer: Medicare Other

## 2012-08-15 ENCOUNTER — Ambulatory Visit (INDEPENDENT_AMBULATORY_CARE_PROVIDER_SITE_OTHER): Payer: Medicare Other | Admitting: Gastroenterology

## 2012-08-15 DIAGNOSIS — E538 Deficiency of other specified B group vitamins: Secondary | ICD-10-CM

## 2012-08-15 MED ORDER — CYANOCOBALAMIN 1000 MCG/ML IJ SOLN
1000.0000 ug | Freq: Once | INTRAMUSCULAR | Status: AC
  Administered 2012-08-15: 1000 ug via INTRAMUSCULAR

## 2012-08-17 ENCOUNTER — Encounter (HOSPITAL_COMMUNITY)
Admission: RE | Admit: 2012-08-17 | Discharge: 2012-08-17 | Disposition: A | Discharge status: Home / Self Care | Payer: Self-pay | Source: Ambulatory Visit | Attending: Cardiovascular Disease | Admitting: Cardiovascular Disease

## 2012-08-17 ENCOUNTER — Encounter (HOSPITAL_COMMUNITY): Payer: Medicare Other

## 2012-08-20 ENCOUNTER — Encounter (HOSPITAL_COMMUNITY)
Admission: RE | Admit: 2012-08-20 | Discharge: 2012-08-20 | Disposition: A | Discharge status: Home / Self Care | Payer: Self-pay | Source: Ambulatory Visit | Attending: Cardiovascular Disease | Admitting: Cardiovascular Disease

## 2012-08-20 ENCOUNTER — Encounter (HOSPITAL_COMMUNITY): Payer: Medicare Other

## 2012-08-22 ENCOUNTER — Encounter (HOSPITAL_COMMUNITY)
Admission: RE | Admit: 2012-08-22 | Discharge: 2012-08-22 | Disposition: A | Discharge status: Home / Self Care | Payer: Self-pay | Source: Ambulatory Visit | Attending: Cardiovascular Disease | Admitting: Cardiovascular Disease

## 2012-08-22 ENCOUNTER — Encounter (HOSPITAL_COMMUNITY): Payer: Medicare Other

## 2012-08-24 ENCOUNTER — Encounter (HOSPITAL_COMMUNITY): Payer: Medicare Other

## 2012-08-24 ENCOUNTER — Encounter (HOSPITAL_COMMUNITY)
Admission: RE | Admit: 2012-08-24 | Discharge: 2012-08-24 | Disposition: A | Discharge status: Home / Self Care | Payer: Self-pay | Source: Ambulatory Visit | Attending: Cardiovascular Disease | Admitting: Cardiovascular Disease

## 2012-08-27 ENCOUNTER — Encounter (HOSPITAL_COMMUNITY): Payer: Medicare Other

## 2012-08-27 ENCOUNTER — Encounter (HOSPITAL_COMMUNITY)
Admission: RE | Admit: 2012-08-27 | Discharge: 2012-08-27 | Disposition: A | Discharge status: Home / Self Care | Payer: Self-pay | Source: Ambulatory Visit | Attending: Cardiovascular Disease | Admitting: Cardiovascular Disease

## 2012-08-29 ENCOUNTER — Encounter (HOSPITAL_COMMUNITY): Payer: Medicare Other

## 2012-08-29 ENCOUNTER — Encounter (HOSPITAL_COMMUNITY)
Admission: RE | Admit: 2012-08-29 | Discharge: 2012-08-29 | Disposition: A | Discharge status: Home / Self Care | Payer: Self-pay | Source: Ambulatory Visit | Attending: Cardiovascular Disease | Admitting: Cardiovascular Disease

## 2012-08-30 ENCOUNTER — Ambulatory Visit (INDEPENDENT_AMBULATORY_CARE_PROVIDER_SITE_OTHER): Payer: Medicare Other | Admitting: Cardiovascular Disease

## 2012-08-30 ENCOUNTER — Encounter: Payer: Self-pay | Admitting: Cardiovascular Disease

## 2012-08-30 VITALS — BP 146/88 | HR 87 | Ht 68.0 in | Wt 309.0 lb

## 2012-08-30 DIAGNOSIS — I2510 Atherosclerotic heart disease of native coronary artery without angina pectoris: Secondary | ICD-10-CM

## 2012-08-30 DIAGNOSIS — I4891 Unspecified atrial fibrillation: Secondary | ICD-10-CM | POA: Diagnosis not present

## 2012-08-30 NOTE — Patient Instructions (Signed)
Your physician recommends that you have lab work: P2Y12--Please go to Admitting at [facility] and they will send you to the lab  Your physician wants you to follow-up in: 1 YEAR with Dr Burt Knack.  You will receive a reminder letter in the mail two months in advance. If you don't receive a letter, please call our office to schedule the follow-up appointment.  Your physician recommends that you continue on your current medications as directed. Please refer to the Current Medication list given to you today.

## 2012-08-30 NOTE — Progress Notes (Signed)
HPI:  66 year old gentleman presenting for followup of atrial fibrillation. He was initially diagnosed in 2012 and underwent cardioversion but he failed this and has remained in atrial fibrillation. He's been anticoagulated with pradaxa and maintained on ASA and plavix because of history of stent thrombosis.  The patient is doing well at present. He participates in cardiac rehabilitation 3 times weekly. He's been unable to lose any weight. He denies chest pain, chest pressure, or any changes in his exertional shortness of breath. He denies leg swelling, orthopnea, PND, or palpitations.  Outpatient Encounter Prescriptions as of 08/30/2012  Medication Sig Dispense Refill  . amLODipine (NORVASC) 10 MG tablet TAKE ONE (1) TABLET BY MOUTH EVERY      DAY  30 tablet  0  . aspirin 81 MG EC tablet Take 81 mg by mouth daily.        Marland Kitchen BENICAR 40 MG tablet TAKE ONE (1) TABLET EACH DAY  30 tablet  5  . carvedilol (COREG) 12.5 MG tablet Take one tablet (12.5 mg) every afternoon.  30 tablet  6  . carvedilol (COREG) 25 MG tablet TAKE ONE TABLET TWICE DAILY WITH A MEAL  60 tablet  3  . clopidogrel (PLAVIX) 75 MG tablet TAKE ONE (1) TABLET EACH DAY  30 tablet  5  . econazole nitrate 1 % cream       . fexofenadine (ALLEGRA) 180 MG tablet Take 1 tablet (180 mg total) by mouth daily.      . fish oil-omega-3 fatty acids 1000 MG capsule Take 2 g by mouth daily.       . furosemide (LASIX) 40 MG tablet TAKE ONE TABLET ONCE DAILY  30 tablet  3  . Glucosamine-Chondroitin (OSTEO BI-FLEX REGULAR STRENGTH) 250-200 MG TABS Take 1 tablet by mouth 2 (two) times daily.       Marland Kitchen glyBURIDE (DIABETA) 2.5 MG tablet Take 1 tablet (2.5 mg total) by mouth daily with breakfast.  90 tablet  1  . JANUVIA 100 MG tablet TAKE ONE (1) TABLET EACH DAY  90 tablet  1  . KLOR-CON M20 20 MEQ tablet TAKE ONE (1) TABLET BY MOUTH EVERY      DAY  90 tablet  0  . mesalamine (APRISO) 0.375 G 24 hr capsule Take 4 capsules by mouth once daily.  120  capsule  3  . metFORMIN (GLUCOPHAGE) 500 MG tablet Take 1 tablet (500 mg total) by mouth daily with breakfast.  90 tablet  1  . mometasone (NASONEX) 50 MCG/ACT nasal spray Two sprays into each nostril in the morning once daily  17 g  1  . ONE TOUCH ULTRA TEST test strip       . PRADAXA 150 MG CAPS TAKE ONE CAPSULE BY MOUTH TWICE A DAY  60 capsule  0  . SYNTHROID 50 MCG tablet TAKE ONE (1) TABLET BY MOUTH EVERY      DAY  90 tablet  1  . triamcinolone (KENALOG) 0.5 % cream Apply 1 application topically as needed. Apply bid to affected area       Facility-Administered Encounter Medications as of 08/30/2012  Medication Dose Route Frequency Provider Last Rate Last Dose  . [DISCONTINUED] cyanocobalamin ((VITAMIN B-12)) injection 1,000 mcg  1,000 mcg Intramuscular Q30 days Lisabeth Pick, MD   1,000 mcg at 05/30/12 1127    No Known Allergies  Past Medical History  Diagnosis Date  . CORONARY ARTERY DISEASE 04/12/2007  . DIABETES MELLITUS, TYPE II 04/16/2007  . HYPERLIPIDEMIA  04/16/2007  . HYPERTENSION 04/12/2007  . HYPOTHYROIDISM 04/12/2007  . MYOCARDIAL INFARCTION, HX OF 04/12/2007  . OBESITY 06/16/2009  . SLEEP APNEA 04/12/2007  . ULCERATIVE COLITIS, LEFT SIDED 11/23/2010  . Ischemic cardiomyopathy     cath 35-40%  . Atrial fibrillation     initial diagnoses 2012  . B12 deficiency     per patient    ROS: Negative except as per HPI  BP 146/88  Pulse 87  Ht 5' 8"  (1.727 m)  Wt 140.161 kg (309 lb)  BMI 46.98 kg/m2  PHYSICAL EXAM: Pt is alert and oriented, pleasant obese male in NAD HEENT: normal Neck: JVP - normal, carotids 2+= without bruits Lungs: CTA bilaterally CV: Irregularly irregular without murmur or gallop Abd: soft, NT, Positive BS, no hepatomegaly Ext: Trace bilateral pretibial edema, distal pulses intact and equal Skin: warm/dry no rash  EKG:  Atrial fibrillation 87 beats per minute, ST and T wave abnormality consider inferolateral ischemia, LVH.  ASSESSMENT AND PLAN: 1.  Chronic atrial fibrillation. The patient is tolerating the strategy of rate control and anticoagulation. Will continue the same.  2. Coronary artery disease, native vessel with history of stent thrombosis. He is maintained on long-term dual antiplatelet therapy with aspirin and Plavix. I remain concerned about his high bleeding risk on 2 antiplatelet drugs plus Pradaxa. I am going to check a P2 Y. 12 test and if he is responsive to Plavix will stop aspirin.  3. Hypertension. Blood pressure is mildly elevated today. He's on Benicar and Coreg. Continue the same and follow BP - May need increased medication. Reviewed weight loss, salt restriction, exercise.  Sherren Mocha 08/30/2012 10:26 AM

## 2012-08-31 ENCOUNTER — Encounter (HOSPITAL_COMMUNITY): Payer: Medicare Other

## 2012-08-31 ENCOUNTER — Encounter (HOSPITAL_COMMUNITY): Payer: Self-pay

## 2012-08-31 ENCOUNTER — Other Ambulatory Visit: Payer: Self-pay | Admitting: Cardiovascular Disease

## 2012-09-03 ENCOUNTER — Other Ambulatory Visit: Payer: Self-pay | Admitting: *Deleted

## 2012-09-03 ENCOUNTER — Encounter (HOSPITAL_COMMUNITY)
Admission: RE | Admit: 2012-09-03 | Discharge: 2012-09-03 | Disposition: A | Discharge status: Home / Self Care | Payer: Self-pay | Source: Ambulatory Visit | Attending: Cardiovascular Disease | Admitting: Cardiovascular Disease

## 2012-09-03 ENCOUNTER — Encounter (HOSPITAL_COMMUNITY): Payer: Medicare Other

## 2012-09-03 MED ORDER — MESALAMINE ER 0.375 G PO CP24: ORAL_CAPSULE | ORAL | Status: DC

## 2012-09-05 ENCOUNTER — Encounter (HOSPITAL_COMMUNITY): Payer: Medicare Other

## 2012-09-05 ENCOUNTER — Other Ambulatory Visit: Payer: Self-pay | Admitting: Cardiovascular Disease

## 2012-09-05 ENCOUNTER — Encounter (HOSPITAL_COMMUNITY)
Admission: RE | Admit: 2012-09-05 | Discharge: 2012-09-05 | Disposition: A | Discharge status: Home / Self Care | Payer: Self-pay | Source: Ambulatory Visit | Attending: Cardiovascular Disease | Admitting: Cardiovascular Disease

## 2012-09-07 ENCOUNTER — Encounter (HOSPITAL_COMMUNITY): Payer: Medicare Other

## 2012-09-07 ENCOUNTER — Encounter (HOSPITAL_COMMUNITY): Payer: Self-pay

## 2012-09-07 ENCOUNTER — Other Ambulatory Visit: Payer: Self-pay | Admitting: *Deleted

## 2012-09-07 MED ORDER — POTASSIUM CHLORIDE CRYS ER 20 MEQ PO TBCR: 20.0000 meq | EXTENDED_RELEASE_TABLET | Freq: Every day | ORAL | Status: DC

## 2012-09-10 ENCOUNTER — Encounter (HOSPITAL_COMMUNITY)
Admission: RE | Admit: 2012-09-10 | Discharge: 2012-09-10 | Disposition: A | Discharge status: Home / Self Care | Payer: Self-pay | Source: Ambulatory Visit | Attending: Cardiovascular Disease | Admitting: Cardiovascular Disease

## 2012-09-10 ENCOUNTER — Encounter (HOSPITAL_COMMUNITY): Payer: Medicare Other

## 2012-09-10 DIAGNOSIS — Z9861 Coronary angioplasty status: Secondary | ICD-10-CM | POA: Insufficient documentation

## 2012-09-10 DIAGNOSIS — I2510 Atherosclerotic heart disease of native coronary artery without angina pectoris: Secondary | ICD-10-CM | POA: Insufficient documentation

## 2012-09-10 DIAGNOSIS — I252 Old myocardial infarction: Secondary | ICD-10-CM | POA: Insufficient documentation

## 2012-09-10 DIAGNOSIS — E119 Type 2 diabetes mellitus without complications: Secondary | ICD-10-CM | POA: Insufficient documentation

## 2012-09-10 DIAGNOSIS — E785 Hyperlipidemia, unspecified: Secondary | ICD-10-CM | POA: Insufficient documentation

## 2012-09-10 DIAGNOSIS — I10 Essential (primary) hypertension: Secondary | ICD-10-CM | POA: Insufficient documentation

## 2012-09-10 DIAGNOSIS — E039 Hypothyroidism, unspecified: Secondary | ICD-10-CM | POA: Insufficient documentation

## 2012-09-10 DIAGNOSIS — E669 Obesity, unspecified: Secondary | ICD-10-CM | POA: Insufficient documentation

## 2012-09-10 DIAGNOSIS — Z5189 Encounter for other specified aftercare: Secondary | ICD-10-CM | POA: Insufficient documentation

## 2012-09-12 ENCOUNTER — Encounter (HOSPITAL_COMMUNITY): Payer: Medicare Other

## 2012-09-12 ENCOUNTER — Encounter (HOSPITAL_COMMUNITY)
Admission: RE | Admit: 2012-09-12 | Discharge: 2012-09-12 | Disposition: A | Discharge status: Home / Self Care | Payer: Self-pay | Source: Ambulatory Visit | Attending: Cardiovascular Disease | Admitting: Cardiovascular Disease

## 2012-09-12 ENCOUNTER — Ambulatory Visit (INDEPENDENT_AMBULATORY_CARE_PROVIDER_SITE_OTHER): Payer: Medicare Other | Admitting: Gastroenterology

## 2012-09-12 DIAGNOSIS — E538 Deficiency of other specified B group vitamins: Secondary | ICD-10-CM

## 2012-09-12 MED ORDER — CYANOCOBALAMIN 1000 MCG/ML IJ SOLN
1000.0000 ug | Freq: Once | INTRAMUSCULAR | Status: AC
  Administered 2012-09-12: 1000 ug via INTRAMUSCULAR

## 2012-09-14 ENCOUNTER — Encounter (HOSPITAL_COMMUNITY): Payer: Medicare Other

## 2012-09-14 ENCOUNTER — Encounter (HOSPITAL_COMMUNITY)
Admission: RE | Admit: 2012-09-14 | Discharge: 2012-09-14 | Disposition: A | Discharge status: Home / Self Care | Payer: Self-pay | Source: Ambulatory Visit | Attending: Cardiovascular Disease | Admitting: Cardiovascular Disease

## 2012-09-17 ENCOUNTER — Encounter (HOSPITAL_COMMUNITY)
Admission: RE | Admit: 2012-09-17 | Discharge: 2012-09-17 | Disposition: A | Discharge status: Home / Self Care | Payer: Self-pay | Source: Ambulatory Visit | Attending: Cardiovascular Disease | Admitting: Cardiovascular Disease

## 2012-09-17 ENCOUNTER — Encounter (HOSPITAL_COMMUNITY): Payer: Medicare Other

## 2012-09-19 ENCOUNTER — Encounter (HOSPITAL_COMMUNITY): Payer: Medicare Other

## 2012-09-19 ENCOUNTER — Encounter (HOSPITAL_COMMUNITY)
Admission: RE | Admit: 2012-09-19 | Discharge: 2012-09-19 | Disposition: A | Discharge status: Home / Self Care | Payer: Self-pay | Source: Ambulatory Visit | Attending: Cardiovascular Disease | Admitting: Cardiovascular Disease

## 2012-09-20 ENCOUNTER — Ambulatory Visit (HOSPITAL_COMMUNITY)
Admission: RE | Admit: 2012-09-20 | Discharge: 2012-09-20 | Disposition: A | Discharge status: Home / Self Care | Payer: Medicare Other | Source: Ambulatory Visit | Attending: Cardiovascular Disease | Admitting: Cardiovascular Disease

## 2012-09-20 ENCOUNTER — Ambulatory Visit (HOSPITAL_COMMUNITY): Admission: RE | Admit: 2012-09-20 | Payer: Medicare Other | Source: Ambulatory Visit | Admitting: Cardiovascular Disease

## 2012-09-20 DIAGNOSIS — E785 Hyperlipidemia, unspecified: Secondary | ICD-10-CM | POA: Diagnosis not present

## 2012-09-20 DIAGNOSIS — I2510 Atherosclerotic heart disease of native coronary artery without angina pectoris: Secondary | ICD-10-CM | POA: Insufficient documentation

## 2012-09-20 DIAGNOSIS — E039 Hypothyroidism, unspecified: Secondary | ICD-10-CM | POA: Insufficient documentation

## 2012-09-20 DIAGNOSIS — I252 Old myocardial infarction: Secondary | ICD-10-CM | POA: Diagnosis not present

## 2012-09-20 DIAGNOSIS — Z9861 Coronary angioplasty status: Secondary | ICD-10-CM | POA: Diagnosis not present

## 2012-09-20 DIAGNOSIS — E669 Obesity, unspecified: Secondary | ICD-10-CM | POA: Diagnosis not present

## 2012-09-20 DIAGNOSIS — Z5189 Encounter for other specified aftercare: Secondary | ICD-10-CM | POA: Diagnosis not present

## 2012-09-20 DIAGNOSIS — E119 Type 2 diabetes mellitus without complications: Secondary | ICD-10-CM | POA: Insufficient documentation

## 2012-09-20 DIAGNOSIS — I10 Essential (primary) hypertension: Secondary | ICD-10-CM | POA: Diagnosis not present

## 2012-09-21 ENCOUNTER — Encounter (HOSPITAL_COMMUNITY): Payer: Medicare Other

## 2012-09-21 ENCOUNTER — Encounter (HOSPITAL_COMMUNITY)
Admission: RE | Admit: 2012-09-21 | Discharge: 2012-09-21 | Disposition: A | Discharge status: Home / Self Care | Payer: Self-pay | Source: Ambulatory Visit | Attending: Cardiovascular Disease | Admitting: Cardiovascular Disease

## 2012-09-22 ENCOUNTER — Other Ambulatory Visit: Payer: Self-pay | Admitting: Cardiovascular Disease

## 2012-09-24 ENCOUNTER — Encounter (HOSPITAL_COMMUNITY): Payer: Medicare Other

## 2012-09-24 ENCOUNTER — Encounter (HOSPITAL_COMMUNITY)
Admission: RE | Admit: 2012-09-24 | Discharge: 2012-09-24 | Disposition: A | Discharge status: Home / Self Care | Payer: Self-pay | Source: Ambulatory Visit | Attending: Cardiovascular Disease | Admitting: Cardiovascular Disease

## 2012-09-24 ENCOUNTER — Other Ambulatory Visit: Payer: Self-pay

## 2012-09-26 ENCOUNTER — Encounter (HOSPITAL_COMMUNITY)
Admission: RE | Admit: 2012-09-26 | Discharge: 2012-09-26 | Disposition: A | Discharge status: Home / Self Care | Payer: Self-pay | Source: Ambulatory Visit | Attending: Cardiovascular Disease | Admitting: Cardiovascular Disease

## 2012-09-26 ENCOUNTER — Encounter (HOSPITAL_COMMUNITY): Payer: Medicare Other

## 2012-09-28 ENCOUNTER — Encounter (HOSPITAL_COMMUNITY)
Admission: RE | Admit: 2012-09-28 | Discharge: 2012-09-28 | Disposition: A | Discharge status: Home / Self Care | Payer: Self-pay | Source: Ambulatory Visit | Attending: Cardiovascular Disease | Admitting: Cardiovascular Disease

## 2012-09-28 ENCOUNTER — Encounter (HOSPITAL_COMMUNITY): Payer: Medicare Other

## 2012-09-29 ENCOUNTER — Other Ambulatory Visit: Payer: Self-pay | Admitting: Cardiovascular Disease

## 2012-10-01 ENCOUNTER — Encounter (HOSPITAL_COMMUNITY): Payer: Medicare Other

## 2012-10-01 ENCOUNTER — Encounter (HOSPITAL_COMMUNITY)
Admission: RE | Admit: 2012-10-01 | Discharge: 2012-10-01 | Disposition: A | Discharge status: Home / Self Care | Payer: Self-pay | Source: Ambulatory Visit | Attending: Cardiovascular Disease | Admitting: Cardiovascular Disease

## 2012-10-03 ENCOUNTER — Encounter (HOSPITAL_COMMUNITY): Payer: Medicare Other

## 2012-10-05 ENCOUNTER — Encounter (HOSPITAL_COMMUNITY): Payer: Medicare Other

## 2012-10-05 ENCOUNTER — Encounter (HOSPITAL_COMMUNITY)
Admission: RE | Admit: 2012-10-05 | Discharge: 2012-10-05 | Disposition: A | Discharge status: Home / Self Care | Payer: Self-pay | Source: Ambulatory Visit | Attending: Cardiovascular Disease | Admitting: Cardiovascular Disease

## 2012-10-08 ENCOUNTER — Encounter (HOSPITAL_COMMUNITY)
Admission: RE | Admit: 2012-10-08 | Discharge: 2012-10-08 | Disposition: A | Discharge status: Home / Self Care | Payer: Self-pay | Source: Ambulatory Visit | Attending: Cardiovascular Disease | Admitting: Cardiovascular Disease

## 2012-10-08 ENCOUNTER — Encounter (HOSPITAL_COMMUNITY): Payer: Medicare Other

## 2012-10-10 ENCOUNTER — Encounter (HOSPITAL_COMMUNITY): Payer: Self-pay

## 2012-10-10 LAB — HM DIABETES EYE EXAM

## 2012-10-12 ENCOUNTER — Encounter (HOSPITAL_COMMUNITY)
Admission: RE | Admit: 2012-10-12 | Discharge: 2012-10-12 | Disposition: A | Discharge status: Home / Self Care | Payer: Self-pay | Source: Ambulatory Visit | Attending: Cardiovascular Disease | Admitting: Cardiovascular Disease

## 2012-10-12 ENCOUNTER — Encounter (HOSPITAL_COMMUNITY): Payer: Self-pay

## 2012-10-12 DIAGNOSIS — E039 Hypothyroidism, unspecified: Secondary | ICD-10-CM | POA: Insufficient documentation

## 2012-10-12 DIAGNOSIS — E119 Type 2 diabetes mellitus without complications: Secondary | ICD-10-CM | POA: Insufficient documentation

## 2012-10-12 DIAGNOSIS — E785 Hyperlipidemia, unspecified: Secondary | ICD-10-CM | POA: Insufficient documentation

## 2012-10-12 DIAGNOSIS — I10 Essential (primary) hypertension: Secondary | ICD-10-CM | POA: Insufficient documentation

## 2012-10-12 DIAGNOSIS — E669 Obesity, unspecified: Secondary | ICD-10-CM | POA: Insufficient documentation

## 2012-10-12 DIAGNOSIS — Z9861 Coronary angioplasty status: Secondary | ICD-10-CM | POA: Insufficient documentation

## 2012-10-12 DIAGNOSIS — I252 Old myocardial infarction: Secondary | ICD-10-CM | POA: Insufficient documentation

## 2012-10-12 DIAGNOSIS — I2510 Atherosclerotic heart disease of native coronary artery without angina pectoris: Secondary | ICD-10-CM | POA: Insufficient documentation

## 2012-10-12 DIAGNOSIS — Z5189 Encounter for other specified aftercare: Secondary | ICD-10-CM | POA: Insufficient documentation

## 2012-10-15 ENCOUNTER — Encounter (HOSPITAL_COMMUNITY)
Admission: RE | Admit: 2012-10-15 | Discharge: 2012-10-15 | Disposition: A | Discharge status: Home / Self Care | Payer: Self-pay | Source: Ambulatory Visit | Attending: Cardiovascular Disease | Admitting: Cardiovascular Disease

## 2012-10-15 ENCOUNTER — Encounter (HOSPITAL_COMMUNITY): Payer: Self-pay

## 2012-10-16 ENCOUNTER — Ambulatory Visit (INDEPENDENT_AMBULATORY_CARE_PROVIDER_SITE_OTHER): Payer: Medicare Other | Admitting: Gastroenterology

## 2012-10-16 DIAGNOSIS — E538 Deficiency of other specified B group vitamins: Secondary | ICD-10-CM | POA: Diagnosis not present

## 2012-10-16 MED ORDER — CYANOCOBALAMIN 1000 MCG/ML IJ SOLN
1000.0000 ug | Freq: Once | INTRAMUSCULAR | Status: AC
  Administered 2012-10-16: 1000 ug via INTRAMUSCULAR

## 2012-10-17 ENCOUNTER — Encounter (HOSPITAL_COMMUNITY)
Admission: RE | Admit: 2012-10-17 | Discharge: 2012-10-17 | Disposition: A | Discharge status: Home / Self Care | Payer: Self-pay | Source: Ambulatory Visit | Attending: Cardiovascular Disease | Admitting: Cardiovascular Disease

## 2012-10-17 ENCOUNTER — Encounter (HOSPITAL_COMMUNITY): Payer: Self-pay

## 2012-10-19 ENCOUNTER — Encounter (HOSPITAL_COMMUNITY)
Admission: RE | Admit: 2012-10-19 | Discharge: 2012-10-19 | Disposition: A | Discharge status: Home / Self Care | Payer: Self-pay | Source: Ambulatory Visit | Attending: Cardiovascular Disease | Admitting: Cardiovascular Disease

## 2012-10-19 ENCOUNTER — Encounter (HOSPITAL_COMMUNITY): Payer: Self-pay

## 2012-10-22 ENCOUNTER — Encounter (HOSPITAL_COMMUNITY)
Admission: RE | Admit: 2012-10-22 | Discharge: 2012-10-22 | Disposition: A | Discharge status: Home / Self Care | Payer: Self-pay | Source: Ambulatory Visit | Attending: Cardiovascular Disease | Admitting: Cardiovascular Disease

## 2012-10-22 ENCOUNTER — Other Ambulatory Visit: Payer: Self-pay | Admitting: *Deleted

## 2012-10-22 ENCOUNTER — Encounter (HOSPITAL_COMMUNITY): Payer: Self-pay

## 2012-10-22 MED ORDER — GLIPIZIDE 5 MG PO TABS: 2.5000 mg | ORAL_TABLET | Freq: Every day | ORAL | Status: DC

## 2012-10-24 ENCOUNTER — Encounter (HOSPITAL_COMMUNITY): Payer: Self-pay

## 2012-10-24 ENCOUNTER — Encounter (HOSPITAL_COMMUNITY)
Admission: RE | Admit: 2012-10-24 | Discharge: 2012-10-24 | Disposition: A | Discharge status: Home / Self Care | Payer: Self-pay | Source: Ambulatory Visit | Attending: Cardiovascular Disease | Admitting: Cardiovascular Disease

## 2012-10-26 ENCOUNTER — Encounter (HOSPITAL_COMMUNITY): Payer: Self-pay

## 2012-10-26 ENCOUNTER — Encounter (HOSPITAL_COMMUNITY)
Admission: RE | Admit: 2012-10-26 | Discharge: 2012-10-26 | Disposition: A | Discharge status: Home / Self Care | Payer: Self-pay | Source: Ambulatory Visit | Attending: Cardiovascular Disease | Admitting: Cardiovascular Disease

## 2012-10-29 ENCOUNTER — Encounter (HOSPITAL_COMMUNITY): Payer: Self-pay

## 2012-10-29 ENCOUNTER — Encounter (HOSPITAL_COMMUNITY)
Admission: RE | Admit: 2012-10-29 | Discharge: 2012-10-29 | Disposition: A | Discharge status: Home / Self Care | Payer: Self-pay | Source: Ambulatory Visit | Attending: Cardiovascular Disease | Admitting: Cardiovascular Disease

## 2012-10-31 ENCOUNTER — Other Ambulatory Visit: Payer: Self-pay | Admitting: *Deleted

## 2012-10-31 ENCOUNTER — Encounter (HOSPITAL_COMMUNITY)
Admission: RE | Admit: 2012-10-31 | Discharge: 2012-10-31 | Disposition: A | Discharge status: Home / Self Care | Payer: Self-pay | Source: Ambulatory Visit | Attending: Cardiovascular Disease | Admitting: Cardiovascular Disease

## 2012-10-31 ENCOUNTER — Encounter (HOSPITAL_COMMUNITY): Payer: Self-pay

## 2012-10-31 MED ORDER — MESALAMINE ER 0.375 G PO CP24: ORAL_CAPSULE | ORAL | Status: DC

## 2012-11-02 ENCOUNTER — Encounter (HOSPITAL_COMMUNITY): Payer: Self-pay

## 2012-11-05 ENCOUNTER — Encounter (HOSPITAL_COMMUNITY): Payer: Self-pay

## 2012-11-07 ENCOUNTER — Encounter (HOSPITAL_COMMUNITY): Payer: Self-pay

## 2012-11-07 ENCOUNTER — Encounter (HOSPITAL_COMMUNITY)
Admission: RE | Admit: 2012-11-07 | Discharge: 2012-11-07 | Disposition: A | Discharge status: Home / Self Care | Payer: Self-pay | Source: Ambulatory Visit | Attending: Internal Medicine | Admitting: Internal Medicine

## 2012-11-09 ENCOUNTER — Encounter (HOSPITAL_COMMUNITY)
Admission: RE | Admit: 2012-11-09 | Discharge: 2012-11-09 | Disposition: A | Discharge status: Home / Self Care | Payer: Self-pay | Source: Ambulatory Visit | Attending: Internal Medicine | Admitting: Internal Medicine

## 2012-11-09 ENCOUNTER — Encounter (HOSPITAL_COMMUNITY): Payer: Self-pay

## 2012-11-12 ENCOUNTER — Encounter (HOSPITAL_COMMUNITY)
Admission: RE | Admit: 2012-11-12 | Discharge: 2012-11-12 | Disposition: A | Discharge status: Home / Self Care | Payer: Self-pay | Source: Ambulatory Visit | Attending: Cardiovascular Disease | Admitting: Cardiovascular Disease

## 2012-11-12 ENCOUNTER — Encounter (HOSPITAL_COMMUNITY): Payer: Self-pay

## 2012-11-12 DIAGNOSIS — I10 Essential (primary) hypertension: Secondary | ICD-10-CM | POA: Insufficient documentation

## 2012-11-12 DIAGNOSIS — I2510 Atherosclerotic heart disease of native coronary artery without angina pectoris: Secondary | ICD-10-CM | POA: Insufficient documentation

## 2012-11-12 DIAGNOSIS — Z5189 Encounter for other specified aftercare: Secondary | ICD-10-CM | POA: Insufficient documentation

## 2012-11-12 DIAGNOSIS — E785 Hyperlipidemia, unspecified: Secondary | ICD-10-CM | POA: Insufficient documentation

## 2012-11-12 DIAGNOSIS — E039 Hypothyroidism, unspecified: Secondary | ICD-10-CM | POA: Insufficient documentation

## 2012-11-12 DIAGNOSIS — E669 Obesity, unspecified: Secondary | ICD-10-CM | POA: Insufficient documentation

## 2012-11-12 DIAGNOSIS — Z9861 Coronary angioplasty status: Secondary | ICD-10-CM | POA: Insufficient documentation

## 2012-11-12 DIAGNOSIS — E119 Type 2 diabetes mellitus without complications: Secondary | ICD-10-CM | POA: Insufficient documentation

## 2012-11-12 DIAGNOSIS — I252 Old myocardial infarction: Secondary | ICD-10-CM | POA: Insufficient documentation

## 2012-11-14 ENCOUNTER — Encounter (HOSPITAL_COMMUNITY): Payer: Self-pay

## 2012-11-14 ENCOUNTER — Encounter (HOSPITAL_COMMUNITY)
Admission: RE | Admit: 2012-11-14 | Discharge: 2012-11-14 | Disposition: A | Discharge status: Home / Self Care | Payer: Self-pay | Source: Ambulatory Visit | Attending: Cardiology | Admitting: Cardiology

## 2012-11-16 ENCOUNTER — Encounter (HOSPITAL_COMMUNITY): Payer: Self-pay

## 2012-11-16 ENCOUNTER — Encounter (HOSPITAL_COMMUNITY)
Admission: RE | Admit: 2012-11-16 | Discharge: 2012-11-16 | Disposition: A | Discharge status: Home / Self Care | Payer: Self-pay | Source: Ambulatory Visit | Attending: Cardiology | Admitting: Cardiology

## 2012-11-19 ENCOUNTER — Encounter (HOSPITAL_COMMUNITY): Payer: Self-pay

## 2012-11-19 ENCOUNTER — Encounter (HOSPITAL_COMMUNITY)
Admission: RE | Admit: 2012-11-19 | Discharge: 2012-11-19 | Disposition: A | Discharge status: Home / Self Care | Payer: Self-pay | Source: Ambulatory Visit | Attending: Cardiology | Admitting: Cardiology

## 2012-11-20 ENCOUNTER — Telehealth: Payer: Self-pay | Admitting: *Deleted

## 2012-11-20 ENCOUNTER — Ambulatory Visit (INDEPENDENT_AMBULATORY_CARE_PROVIDER_SITE_OTHER): Payer: Medicare Other | Admitting: Gastroenterology

## 2012-11-20 DIAGNOSIS — E538 Deficiency of other specified B group vitamins: Secondary | ICD-10-CM

## 2012-11-20 MED ORDER — CYANOCOBALAMIN 1000 MCG/ML IJ SOLN
1000.0000 ug | Freq: Once | INTRAMUSCULAR | Status: AC
  Administered 2012-11-20: 1000 ug via INTRAMUSCULAR

## 2012-11-20 MED ORDER — MESALAMINE ER 0.375 G PO CP24: ORAL_CAPSULE | ORAL | Status: DC

## 2012-11-20 NOTE — Telephone Encounter (Signed)
Researched pt's chart and the NS and CAN were for B12 injections. He should come for yearly f/u visits with Dr Sharlett Iles; next one around 04/2013.  Reordered refills and informed pt.

## 2012-11-21 ENCOUNTER — Encounter (HOSPITAL_COMMUNITY)
Admission: RE | Admit: 2012-11-21 | Discharge: 2012-11-21 | Disposition: A | Discharge status: Home / Self Care | Payer: Self-pay | Source: Ambulatory Visit | Attending: Cardiology | Admitting: Cardiology

## 2012-11-21 ENCOUNTER — Encounter (HOSPITAL_COMMUNITY): Payer: Self-pay

## 2012-11-23 ENCOUNTER — Encounter (HOSPITAL_COMMUNITY): Payer: Self-pay

## 2012-11-26 ENCOUNTER — Encounter (HOSPITAL_COMMUNITY): Payer: Self-pay

## 2012-11-26 ENCOUNTER — Encounter (HOSPITAL_COMMUNITY)
Admission: RE | Admit: 2012-11-26 | Discharge: 2012-11-26 | Disposition: A | Discharge status: Home / Self Care | Payer: Self-pay | Source: Ambulatory Visit | Attending: Cardiology | Admitting: Cardiology

## 2012-11-28 ENCOUNTER — Encounter (HOSPITAL_COMMUNITY): Payer: Self-pay

## 2012-11-28 ENCOUNTER — Encounter (HOSPITAL_COMMUNITY)
Admission: RE | Admit: 2012-11-28 | Discharge: 2012-11-28 | Disposition: A | Discharge status: Home / Self Care | Payer: Self-pay | Source: Ambulatory Visit | Attending: Cardiovascular Disease | Admitting: Cardiovascular Disease

## 2012-11-29 ENCOUNTER — Other Ambulatory Visit: Payer: Self-pay | Admitting: Internal Medicine

## 2012-11-30 ENCOUNTER — Encounter (HOSPITAL_COMMUNITY)
Admission: RE | Admit: 2012-11-30 | Discharge: 2012-11-30 | Disposition: A | Discharge status: Home / Self Care | Payer: Self-pay | Source: Ambulatory Visit | Attending: *Deleted | Admitting: *Deleted

## 2012-11-30 ENCOUNTER — Encounter (HOSPITAL_COMMUNITY): Payer: Self-pay

## 2012-12-03 ENCOUNTER — Encounter (HOSPITAL_COMMUNITY): Payer: Self-pay

## 2012-12-03 ENCOUNTER — Encounter (HOSPITAL_COMMUNITY)
Admission: RE | Admit: 2012-12-03 | Discharge: 2012-12-03 | Disposition: A | Discharge status: Home / Self Care | Payer: Self-pay | Source: Ambulatory Visit | Attending: Cardiology | Admitting: Cardiology

## 2012-12-05 ENCOUNTER — Encounter (HOSPITAL_COMMUNITY)
Admission: RE | Admit: 2012-12-05 | Discharge: 2012-12-05 | Disposition: A | Discharge status: Home / Self Care | Payer: Self-pay | Source: Ambulatory Visit | Attending: Cardiology | Admitting: Cardiology

## 2012-12-05 ENCOUNTER — Encounter (HOSPITAL_COMMUNITY): Payer: Self-pay

## 2012-12-07 ENCOUNTER — Encounter (HOSPITAL_COMMUNITY): Payer: Self-pay

## 2012-12-07 ENCOUNTER — Encounter (HOSPITAL_COMMUNITY)
Admission: RE | Admit: 2012-12-07 | Discharge: 2012-12-07 | Disposition: A | Discharge status: Home / Self Care | Payer: Self-pay | Source: Ambulatory Visit | Attending: Cardiology | Admitting: Cardiology

## 2012-12-10 ENCOUNTER — Encounter (HOSPITAL_COMMUNITY): Payer: Self-pay

## 2012-12-10 ENCOUNTER — Encounter (HOSPITAL_COMMUNITY)
Admission: RE | Admit: 2012-12-10 | Discharge: 2012-12-10 | Disposition: A | Discharge status: Home / Self Care | Payer: Self-pay | Source: Ambulatory Visit | Attending: Cardiovascular Disease | Admitting: Cardiovascular Disease

## 2012-12-10 DIAGNOSIS — E039 Hypothyroidism, unspecified: Secondary | ICD-10-CM | POA: Insufficient documentation

## 2012-12-10 DIAGNOSIS — I252 Old myocardial infarction: Secondary | ICD-10-CM | POA: Insufficient documentation

## 2012-12-10 DIAGNOSIS — Z9861 Coronary angioplasty status: Secondary | ICD-10-CM | POA: Insufficient documentation

## 2012-12-10 DIAGNOSIS — I2510 Atherosclerotic heart disease of native coronary artery without angina pectoris: Secondary | ICD-10-CM | POA: Insufficient documentation

## 2012-12-10 DIAGNOSIS — I10 Essential (primary) hypertension: Secondary | ICD-10-CM | POA: Insufficient documentation

## 2012-12-10 DIAGNOSIS — E669 Obesity, unspecified: Secondary | ICD-10-CM | POA: Insufficient documentation

## 2012-12-10 DIAGNOSIS — E785 Hyperlipidemia, unspecified: Secondary | ICD-10-CM | POA: Insufficient documentation

## 2012-12-10 DIAGNOSIS — E119 Type 2 diabetes mellitus without complications: Secondary | ICD-10-CM | POA: Insufficient documentation

## 2012-12-10 DIAGNOSIS — Z5189 Encounter for other specified aftercare: Secondary | ICD-10-CM | POA: Insufficient documentation

## 2012-12-12 ENCOUNTER — Encounter (HOSPITAL_COMMUNITY): Payer: Self-pay

## 2012-12-12 ENCOUNTER — Encounter (HOSPITAL_COMMUNITY)
Admission: RE | Admit: 2012-12-12 | Discharge: 2012-12-12 | Disposition: A | Discharge status: Home / Self Care | Payer: Self-pay | Source: Ambulatory Visit | Attending: Cardiovascular Disease | Admitting: Cardiovascular Disease

## 2012-12-14 ENCOUNTER — Encounter (HOSPITAL_COMMUNITY): Payer: Self-pay

## 2012-12-17 ENCOUNTER — Encounter (HOSPITAL_COMMUNITY): Payer: Self-pay

## 2012-12-17 ENCOUNTER — Encounter (HOSPITAL_COMMUNITY)
Admission: RE | Admit: 2012-12-17 | Discharge: 2012-12-17 | Disposition: A | Discharge status: Home / Self Care | Payer: Self-pay | Source: Ambulatory Visit | Attending: Cardiovascular Disease | Admitting: Cardiovascular Disease

## 2012-12-19 ENCOUNTER — Encounter (HOSPITAL_COMMUNITY): Payer: Self-pay

## 2012-12-19 ENCOUNTER — Encounter (HOSPITAL_COMMUNITY)
Admission: RE | Admit: 2012-12-19 | Discharge: 2012-12-19 | Disposition: A | Discharge status: Home / Self Care | Payer: Self-pay | Source: Ambulatory Visit | Attending: Cardiovascular Disease | Admitting: Cardiovascular Disease

## 2012-12-20 DIAGNOSIS — L259 Unspecified contact dermatitis, unspecified cause: Secondary | ICD-10-CM | POA: Diagnosis not present

## 2012-12-20 DIAGNOSIS — Q828 Other specified congenital malformations of skin: Secondary | ICD-10-CM | POA: Diagnosis not present

## 2012-12-20 DIAGNOSIS — M79609 Pain in unspecified limb: Secondary | ICD-10-CM | POA: Diagnosis not present

## 2012-12-20 DIAGNOSIS — B351 Tinea unguium: Secondary | ICD-10-CM | POA: Diagnosis not present

## 2012-12-21 ENCOUNTER — Encounter (HOSPITAL_COMMUNITY)
Admission: RE | Admit: 2012-12-21 | Discharge: 2012-12-21 | Disposition: A | Discharge status: Home / Self Care | Payer: Self-pay | Source: Ambulatory Visit | Attending: Cardiology | Admitting: Cardiology

## 2012-12-21 ENCOUNTER — Encounter (HOSPITAL_COMMUNITY): Payer: Self-pay

## 2012-12-24 ENCOUNTER — Encounter (HOSPITAL_COMMUNITY): Payer: Self-pay

## 2012-12-26 ENCOUNTER — Encounter (HOSPITAL_COMMUNITY): Payer: Self-pay

## 2012-12-26 ENCOUNTER — Encounter (HOSPITAL_COMMUNITY)
Admission: RE | Admit: 2012-12-26 | Discharge: 2012-12-26 | Disposition: A | Discharge status: Home / Self Care | Payer: Self-pay | Source: Ambulatory Visit | Attending: Cardiovascular Disease | Admitting: Cardiovascular Disease

## 2012-12-28 ENCOUNTER — Encounter (HOSPITAL_COMMUNITY): Payer: Self-pay

## 2012-12-28 ENCOUNTER — Encounter (HOSPITAL_COMMUNITY)
Admission: RE | Admit: 2012-12-28 | Discharge: 2012-12-28 | Disposition: A | Discharge status: Home / Self Care | Payer: Self-pay | Source: Ambulatory Visit | Attending: Cardiovascular Disease | Admitting: Cardiovascular Disease

## 2012-12-31 ENCOUNTER — Encounter (HOSPITAL_COMMUNITY)
Admission: RE | Admit: 2012-12-31 | Discharge: 2012-12-31 | Disposition: A | Discharge status: Home / Self Care | Payer: Self-pay | Source: Ambulatory Visit | Attending: Cardiovascular Disease | Admitting: Cardiovascular Disease

## 2012-12-31 ENCOUNTER — Encounter (HOSPITAL_COMMUNITY): Payer: Self-pay

## 2013-01-02 ENCOUNTER — Encounter (HOSPITAL_COMMUNITY): Payer: Self-pay

## 2013-01-02 ENCOUNTER — Encounter (HOSPITAL_COMMUNITY)
Admission: RE | Admit: 2013-01-02 | Discharge: 2013-01-02 | Disposition: A | Discharge status: Home / Self Care | Payer: Self-pay | Source: Ambulatory Visit | Attending: Cardiovascular Disease | Admitting: Cardiovascular Disease

## 2013-01-04 ENCOUNTER — Encounter (HOSPITAL_COMMUNITY)
Admission: RE | Admit: 2013-01-04 | Discharge: 2013-01-04 | Disposition: A | Discharge status: Home / Self Care | Payer: Self-pay | Source: Ambulatory Visit | Attending: Cardiovascular Disease | Admitting: Cardiovascular Disease

## 2013-01-04 ENCOUNTER — Encounter (HOSPITAL_COMMUNITY): Payer: Self-pay

## 2013-01-07 ENCOUNTER — Encounter (HOSPITAL_COMMUNITY)
Admission: RE | Admit: 2013-01-07 | Discharge: 2013-01-07 | Disposition: A | Discharge status: Home / Self Care | Payer: Self-pay | Source: Ambulatory Visit | Attending: Cardiovascular Disease | Admitting: Cardiovascular Disease

## 2013-01-07 ENCOUNTER — Encounter (HOSPITAL_COMMUNITY): Payer: Self-pay

## 2013-01-09 ENCOUNTER — Encounter (HOSPITAL_COMMUNITY)
Admission: RE | Admit: 2013-01-09 | Discharge: 2013-01-09 | Disposition: A | Discharge status: Home / Self Care | Payer: Self-pay | Source: Ambulatory Visit | Attending: Cardiovascular Disease | Admitting: Cardiovascular Disease

## 2013-01-09 DIAGNOSIS — E039 Hypothyroidism, unspecified: Secondary | ICD-10-CM | POA: Insufficient documentation

## 2013-01-09 DIAGNOSIS — E785 Hyperlipidemia, unspecified: Secondary | ICD-10-CM | POA: Insufficient documentation

## 2013-01-09 DIAGNOSIS — Z5189 Encounter for other specified aftercare: Secondary | ICD-10-CM | POA: Insufficient documentation

## 2013-01-09 DIAGNOSIS — I2510 Atherosclerotic heart disease of native coronary artery without angina pectoris: Secondary | ICD-10-CM | POA: Insufficient documentation

## 2013-01-09 DIAGNOSIS — I10 Essential (primary) hypertension: Secondary | ICD-10-CM | POA: Insufficient documentation

## 2013-01-09 DIAGNOSIS — I252 Old myocardial infarction: Secondary | ICD-10-CM | POA: Insufficient documentation

## 2013-01-09 DIAGNOSIS — Z9861 Coronary angioplasty status: Secondary | ICD-10-CM | POA: Insufficient documentation

## 2013-01-09 DIAGNOSIS — E669 Obesity, unspecified: Secondary | ICD-10-CM | POA: Insufficient documentation

## 2013-01-09 DIAGNOSIS — E119 Type 2 diabetes mellitus without complications: Secondary | ICD-10-CM | POA: Insufficient documentation

## 2013-01-11 ENCOUNTER — Encounter (HOSPITAL_COMMUNITY)
Admission: RE | Admit: 2013-01-11 | Discharge: 2013-01-11 | Disposition: A | Discharge status: Home / Self Care | Payer: Self-pay | Source: Ambulatory Visit | Attending: Cardiovascular Disease | Admitting: Cardiovascular Disease

## 2013-01-14 ENCOUNTER — Encounter (HOSPITAL_COMMUNITY)
Admission: RE | Admit: 2013-01-14 | Discharge: 2013-01-14 | Disposition: A | Discharge status: Home / Self Care | Payer: Self-pay | Source: Ambulatory Visit | Attending: Cardiovascular Disease | Admitting: Cardiovascular Disease

## 2013-01-16 ENCOUNTER — Encounter (HOSPITAL_COMMUNITY)
Admission: RE | Admit: 2013-01-16 | Discharge: 2013-01-16 | Disposition: A | Discharge status: Home / Self Care | Payer: Self-pay | Source: Ambulatory Visit | Attending: Cardiovascular Disease | Admitting: Cardiovascular Disease

## 2013-01-18 ENCOUNTER — Encounter (HOSPITAL_COMMUNITY)
Admission: RE | Admit: 2013-01-18 | Discharge: 2013-01-18 | Disposition: A | Discharge status: Home / Self Care | Payer: Self-pay | Source: Ambulatory Visit | Attending: Cardiovascular Disease | Admitting: Cardiovascular Disease

## 2013-01-21 ENCOUNTER — Encounter (HOSPITAL_COMMUNITY)
Admission: RE | Admit: 2013-01-21 | Discharge: 2013-01-21 | Disposition: A | Discharge status: Home / Self Care | Payer: Self-pay | Source: Ambulatory Visit | Attending: Cardiovascular Disease | Admitting: Cardiovascular Disease

## 2013-01-23 ENCOUNTER — Encounter (HOSPITAL_COMMUNITY)
Admission: RE | Admit: 2013-01-23 | Discharge: 2013-01-23 | Disposition: A | Discharge status: Home / Self Care | Payer: Self-pay | Source: Ambulatory Visit | Attending: Cardiovascular Disease | Admitting: Cardiovascular Disease

## 2013-01-25 ENCOUNTER — Encounter (HOSPITAL_COMMUNITY)
Admission: RE | Admit: 2013-01-25 | Discharge: 2013-01-25 | Disposition: A | Discharge status: Home / Self Care | Payer: Self-pay | Source: Ambulatory Visit | Attending: Cardiovascular Disease | Admitting: Cardiovascular Disease

## 2013-01-28 ENCOUNTER — Encounter (HOSPITAL_COMMUNITY)
Admission: RE | Admit: 2013-01-28 | Discharge: 2013-01-28 | Disposition: A | Discharge status: Home / Self Care | Payer: Self-pay | Source: Ambulatory Visit | Attending: Cardiovascular Disease | Admitting: Cardiovascular Disease

## 2013-01-29 ENCOUNTER — Other Ambulatory Visit: Payer: Self-pay | Admitting: Internal Medicine

## 2013-01-30 ENCOUNTER — Encounter (HOSPITAL_COMMUNITY)
Admission: RE | Admit: 2013-01-30 | Discharge: 2013-01-30 | Disposition: A | Discharge status: Home / Self Care | Payer: Self-pay | Source: Ambulatory Visit | Attending: Cardiovascular Disease | Admitting: Cardiovascular Disease

## 2013-02-01 ENCOUNTER — Encounter (HOSPITAL_COMMUNITY)
Admission: RE | Admit: 2013-02-01 | Discharge: 2013-02-01 | Disposition: A | Discharge status: Home / Self Care | Payer: Self-pay | Source: Ambulatory Visit | Attending: Cardiovascular Disease | Admitting: Cardiovascular Disease

## 2013-02-04 ENCOUNTER — Encounter (HOSPITAL_COMMUNITY)
Admission: RE | Admit: 2013-02-04 | Discharge: 2013-02-04 | Disposition: A | Discharge status: Home / Self Care | Payer: Self-pay | Source: Ambulatory Visit | Attending: Cardiovascular Disease | Admitting: Cardiovascular Disease

## 2013-02-06 ENCOUNTER — Encounter (HOSPITAL_COMMUNITY)
Admission: RE | Admit: 2013-02-06 | Discharge: 2013-02-06 | Disposition: A | Discharge status: Home / Self Care | Payer: Self-pay | Source: Ambulatory Visit | Attending: Cardiovascular Disease | Admitting: Cardiovascular Disease

## 2013-02-08 ENCOUNTER — Encounter (HOSPITAL_COMMUNITY)
Admission: RE | Admit: 2013-02-08 | Discharge: 2013-02-08 | Disposition: A | Discharge status: Home / Self Care | Payer: Self-pay | Source: Ambulatory Visit | Attending: Cardiovascular Disease | Admitting: Cardiovascular Disease

## 2013-02-08 DIAGNOSIS — Z9861 Coronary angioplasty status: Secondary | ICD-10-CM | POA: Insufficient documentation

## 2013-02-08 DIAGNOSIS — E119 Type 2 diabetes mellitus without complications: Secondary | ICD-10-CM | POA: Insufficient documentation

## 2013-02-08 DIAGNOSIS — I2510 Atherosclerotic heart disease of native coronary artery without angina pectoris: Secondary | ICD-10-CM | POA: Insufficient documentation

## 2013-02-08 DIAGNOSIS — Z5189 Encounter for other specified aftercare: Secondary | ICD-10-CM | POA: Insufficient documentation

## 2013-02-08 DIAGNOSIS — E669 Obesity, unspecified: Secondary | ICD-10-CM | POA: Insufficient documentation

## 2013-02-08 DIAGNOSIS — I10 Essential (primary) hypertension: Secondary | ICD-10-CM | POA: Insufficient documentation

## 2013-02-08 DIAGNOSIS — E039 Hypothyroidism, unspecified: Secondary | ICD-10-CM | POA: Insufficient documentation

## 2013-02-08 DIAGNOSIS — E785 Hyperlipidemia, unspecified: Secondary | ICD-10-CM | POA: Insufficient documentation

## 2013-02-08 DIAGNOSIS — I252 Old myocardial infarction: Secondary | ICD-10-CM | POA: Insufficient documentation

## 2013-02-11 ENCOUNTER — Encounter (HOSPITAL_COMMUNITY)
Admission: RE | Admit: 2013-02-11 | Discharge: 2013-02-11 | Disposition: A | Discharge status: Home / Self Care | Payer: Self-pay | Source: Ambulatory Visit | Attending: Cardiovascular Disease | Admitting: Cardiovascular Disease

## 2013-02-13 ENCOUNTER — Encounter (HOSPITAL_COMMUNITY)
Admission: RE | Admit: 2013-02-13 | Discharge: 2013-02-13 | Disposition: A | Discharge status: Home / Self Care | Payer: Self-pay | Source: Ambulatory Visit | Attending: Cardiovascular Disease | Admitting: Cardiovascular Disease

## 2013-02-14 ENCOUNTER — Telehealth: Payer: Self-pay | Admitting: Internal Medicine

## 2013-02-14 MED ORDER — OLMESARTAN MEDOXOMIL 40 MG PO TABS: ORAL_TABLET | ORAL | Status: DC

## 2013-02-14 NOTE — Telephone Encounter (Signed)
rx sent in electronically 

## 2013-02-14 NOTE — Telephone Encounter (Signed)
Pt needs refill of BENICAR 40 MG tablet.  Pt aware of office visit needed and MADE his appt, first available 04/02/13. Pt will need enough to get him through til then. Pharm: [facility], [address], [place]

## 2013-02-15 ENCOUNTER — Encounter (HOSPITAL_COMMUNITY)
Admission: RE | Admit: 2013-02-15 | Discharge: 2013-02-15 | Disposition: A | Discharge status: Home / Self Care | Payer: Self-pay | Source: Ambulatory Visit | Attending: Cardiovascular Disease | Admitting: Cardiovascular Disease

## 2013-02-18 ENCOUNTER — Encounter (HOSPITAL_COMMUNITY)
Admission: RE | Admit: 2013-02-18 | Discharge: 2013-02-18 | Disposition: A | Discharge status: Home / Self Care | Payer: Self-pay | Source: Ambulatory Visit | Attending: Cardiovascular Disease | Admitting: Cardiovascular Disease

## 2013-02-20 ENCOUNTER — Encounter (HOSPITAL_COMMUNITY)
Admission: RE | Admit: 2013-02-20 | Discharge: 2013-02-20 | Disposition: A | Discharge status: Home / Self Care | Payer: Self-pay | Source: Ambulatory Visit | Attending: Cardiovascular Disease | Admitting: Cardiovascular Disease

## 2013-02-22 ENCOUNTER — Encounter (HOSPITAL_COMMUNITY)
Admission: RE | Admit: 2013-02-22 | Discharge: 2013-02-22 | Disposition: A | Discharge status: Home / Self Care | Payer: Self-pay | Source: Ambulatory Visit | Attending: Cardiovascular Disease | Admitting: Cardiovascular Disease

## 2013-02-25 ENCOUNTER — Encounter (HOSPITAL_COMMUNITY)
Admission: RE | Admit: 2013-02-25 | Discharge: 2013-02-25 | Disposition: A | Discharge status: Home / Self Care | Payer: Self-pay | Source: Ambulatory Visit | Attending: Cardiovascular Disease | Admitting: Cardiovascular Disease

## 2013-02-26 ENCOUNTER — Other Ambulatory Visit: Payer: Self-pay | Admitting: Internal Medicine

## 2013-02-27 ENCOUNTER — Encounter (HOSPITAL_COMMUNITY)
Admission: RE | Admit: 2013-02-27 | Discharge: 2013-02-27 | Disposition: A | Discharge status: Home / Self Care | Payer: Self-pay | Source: Ambulatory Visit | Attending: Cardiovascular Disease | Admitting: Cardiovascular Disease

## 2013-02-28 ENCOUNTER — Telehealth: Payer: Self-pay | Admitting: Cardiovascular Disease

## 2013-02-28 NOTE — Telephone Encounter (Signed)
Walk in pt Form " Pt Dropped Off Jury Summons" Would Like Excuse note Sent to [facility]  02/28/13/KM

## 2013-03-01 ENCOUNTER — Encounter (HOSPITAL_COMMUNITY)
Admission: RE | Admit: 2013-03-01 | Discharge: 2013-03-01 | Disposition: A | Discharge status: Home / Self Care | Payer: Self-pay | Source: Ambulatory Visit | Attending: Cardiovascular Disease | Admitting: Cardiovascular Disease

## 2013-03-04 ENCOUNTER — Encounter (HOSPITAL_COMMUNITY): Payer: Self-pay

## 2013-03-06 ENCOUNTER — Encounter (HOSPITAL_COMMUNITY)
Admission: RE | Admit: 2013-03-06 | Discharge: 2013-03-06 | Disposition: A | Discharge status: Home / Self Care | Payer: Self-pay | Source: Ambulatory Visit | Attending: Cardiovascular Disease | Admitting: Cardiovascular Disease

## 2013-03-08 ENCOUNTER — Encounter (HOSPITAL_COMMUNITY)
Admission: RE | Admit: 2013-03-08 | Discharge: 2013-03-08 | Disposition: A | Discharge status: Home / Self Care | Payer: Self-pay | Source: Ambulatory Visit | Attending: Cardiovascular Disease | Admitting: Cardiovascular Disease

## 2013-03-11 ENCOUNTER — Encounter (HOSPITAL_COMMUNITY)
Admission: RE | Admit: 2013-03-11 | Discharge: 2013-03-11 | Disposition: A | Discharge status: Home / Self Care | Payer: Self-pay | Source: Ambulatory Visit | Attending: Cardiovascular Disease | Admitting: Cardiovascular Disease

## 2013-03-11 DIAGNOSIS — E119 Type 2 diabetes mellitus without complications: Secondary | ICD-10-CM | POA: Insufficient documentation

## 2013-03-11 DIAGNOSIS — I252 Old myocardial infarction: Secondary | ICD-10-CM | POA: Insufficient documentation

## 2013-03-11 DIAGNOSIS — E669 Obesity, unspecified: Secondary | ICD-10-CM | POA: Insufficient documentation

## 2013-03-11 DIAGNOSIS — I2510 Atherosclerotic heart disease of native coronary artery without angina pectoris: Secondary | ICD-10-CM | POA: Insufficient documentation

## 2013-03-11 DIAGNOSIS — I10 Essential (primary) hypertension: Secondary | ICD-10-CM | POA: Insufficient documentation

## 2013-03-11 DIAGNOSIS — E785 Hyperlipidemia, unspecified: Secondary | ICD-10-CM | POA: Insufficient documentation

## 2013-03-11 DIAGNOSIS — Z9861 Coronary angioplasty status: Secondary | ICD-10-CM | POA: Insufficient documentation

## 2013-03-11 DIAGNOSIS — Z5189 Encounter for other specified aftercare: Secondary | ICD-10-CM | POA: Insufficient documentation

## 2013-03-11 DIAGNOSIS — E039 Hypothyroidism, unspecified: Secondary | ICD-10-CM | POA: Insufficient documentation

## 2013-03-12 MED ORDER — ATROPINE SULFATE 1 MG/ML IJ SOLN
INTRAMUSCULAR | Status: AC
  Filled 2013-03-12: qty 1

## 2013-03-13 ENCOUNTER — Encounter (HOSPITAL_COMMUNITY)
Admission: RE | Admit: 2013-03-13 | Discharge: 2013-03-13 | Disposition: A | Discharge status: Home / Self Care | Payer: Self-pay | Source: Ambulatory Visit | Attending: Cardiovascular Disease | Admitting: Cardiovascular Disease

## 2013-03-14 DIAGNOSIS — M79609 Pain in unspecified limb: Secondary | ICD-10-CM | POA: Diagnosis not present

## 2013-03-14 DIAGNOSIS — B351 Tinea unguium: Secondary | ICD-10-CM | POA: Diagnosis not present

## 2013-03-15 ENCOUNTER — Encounter (HOSPITAL_COMMUNITY)
Admission: RE | Admit: 2013-03-15 | Discharge: 2013-03-15 | Disposition: A | Discharge status: Home / Self Care | Payer: Self-pay | Source: Ambulatory Visit | Attending: Cardiovascular Disease | Admitting: Cardiovascular Disease

## 2013-03-18 ENCOUNTER — Encounter (HOSPITAL_COMMUNITY)
Admission: RE | Admit: 2013-03-18 | Discharge: 2013-03-18 | Disposition: A | Discharge status: Home / Self Care | Payer: Self-pay | Source: Ambulatory Visit | Attending: Cardiovascular Disease | Admitting: Cardiovascular Disease

## 2013-03-20 ENCOUNTER — Encounter (HOSPITAL_COMMUNITY)
Admission: RE | Admit: 2013-03-20 | Discharge: 2013-03-20 | Disposition: A | Discharge status: Home / Self Care | Payer: Self-pay | Source: Ambulatory Visit | Attending: Cardiovascular Disease | Admitting: Cardiovascular Disease

## 2013-03-22 ENCOUNTER — Encounter (HOSPITAL_COMMUNITY)
Admission: RE | Admit: 2013-03-22 | Discharge: 2013-03-22 | Disposition: A | Discharge status: Home / Self Care | Payer: Self-pay | Source: Ambulatory Visit | Attending: Cardiovascular Disease | Admitting: Cardiovascular Disease

## 2013-03-25 ENCOUNTER — Encounter (HOSPITAL_COMMUNITY)
Admission: RE | Admit: 2013-03-25 | Discharge: 2013-03-25 | Disposition: A | Discharge status: Home / Self Care | Payer: Self-pay | Source: Ambulatory Visit | Attending: Cardiovascular Disease | Admitting: Cardiovascular Disease

## 2013-03-27 ENCOUNTER — Encounter (HOSPITAL_COMMUNITY)
Admission: RE | Admit: 2013-03-27 | Discharge: 2013-03-27 | Disposition: A | Discharge status: Home / Self Care | Payer: Self-pay | Source: Ambulatory Visit | Attending: Cardiovascular Disease | Admitting: Cardiovascular Disease

## 2013-03-29 ENCOUNTER — Other Ambulatory Visit: Payer: Self-pay | Admitting: Internal Medicine

## 2013-03-29 ENCOUNTER — Encounter (HOSPITAL_COMMUNITY)
Admission: RE | Admit: 2013-03-29 | Discharge: 2013-03-29 | Disposition: A | Discharge status: Home / Self Care | Payer: Self-pay | Source: Ambulatory Visit | Attending: Cardiovascular Disease | Admitting: Cardiovascular Disease

## 2013-03-29 MED ORDER — SITAGLIPTIN PHOSPHATE 100 MG PO TABS: ORAL_TABLET | ORAL | Status: DC

## 2013-03-29 NOTE — Telephone Encounter (Signed)
Pharmacy called and requested that this RX be approved, considering the pt has an appt on Tuesday 04/02/13. Please assist.

## 2013-03-29 NOTE — Addendum Note (Signed)
Addended by: Townsend Roger D on: 03/29/2013 01:38 PM   Modules accepted: Orders

## 2013-03-29 NOTE — Telephone Encounter (Signed)
rx sent in electronically 

## 2013-03-30 ENCOUNTER — Other Ambulatory Visit: Payer: Self-pay | Admitting: Cardiovascular Disease

## 2013-04-01 ENCOUNTER — Telehealth: Payer: Self-pay | Admitting: Cardiovascular Disease

## 2013-04-01 ENCOUNTER — Encounter (HOSPITAL_COMMUNITY)
Admission: RE | Admit: 2013-04-01 | Discharge: 2013-04-01 | Disposition: A | Discharge status: Home / Self Care | Payer: Self-pay | Source: Ambulatory Visit | Attending: Cardiovascular Disease | Admitting: Cardiovascular Disease

## 2013-04-01 NOTE — Telephone Encounter (Signed)
New problem   Tammy/[facility] a prescription was sent in for Pradaxa and he also takes Plavix from another doctor is he suppose to be on both.

## 2013-04-01 NOTE — Telephone Encounter (Signed)
Reviewed patients chart and November dictation states he is currently on Pradaxa for Afib and Plavix for stent thrombosis. Advised Tammy patient is to be on both medications.

## 2013-04-02 ENCOUNTER — Ambulatory Visit (INDEPENDENT_AMBULATORY_CARE_PROVIDER_SITE_OTHER): Payer: Medicare Other | Admitting: Internal Medicine

## 2013-04-02 ENCOUNTER — Encounter: Payer: Self-pay | Admitting: Internal Medicine

## 2013-04-02 VITALS — BP 138/90 | HR 80 | Temp 97.8°F | Wt 295.0 lb

## 2013-04-02 DIAGNOSIS — E1159 Type 2 diabetes mellitus with other circulatory complications: Secondary | ICD-10-CM | POA: Diagnosis not present

## 2013-04-02 DIAGNOSIS — I10 Essential (primary) hypertension: Secondary | ICD-10-CM

## 2013-04-02 DIAGNOSIS — E119 Type 2 diabetes mellitus without complications: Secondary | ICD-10-CM | POA: Diagnosis not present

## 2013-04-02 DIAGNOSIS — I2510 Atherosclerotic heart disease of native coronary artery without angina pectoris: Secondary | ICD-10-CM

## 2013-04-02 DIAGNOSIS — E039 Hypothyroidism, unspecified: Secondary | ICD-10-CM | POA: Diagnosis not present

## 2013-04-02 LAB — BASIC METABOLIC PANEL
BUN: 12 mg/dL (ref 6–23)
Chloride: 99 mEq/L (ref 96–112)
Creatinine, Ser: 0.8 mg/dL (ref 0.4–1.5)
Glucose, Bld: 338 mg/dL — ABNORMAL HIGH (ref 70–99)
Potassium: 4.3 mEq/L (ref 3.5–5.1)

## 2013-04-02 LAB — HEPATIC FUNCTION PANEL
AST: 17 U/L (ref 0–37)
Alkaline Phosphatase: 78 U/L (ref 39–117)
Bilirubin, Direct: 0.2 mg/dL (ref 0.0–0.3)
Total Bilirubin: 1.1 mg/dL (ref 0.3–1.2)

## 2013-04-02 LAB — TSH: TSH: 1.14 u[IU]/mL (ref 0.35–5.50)

## 2013-04-02 LAB — LIPID PANEL
Total CHOL/HDL Ratio: 2
VLDL: 46 mg/dL — ABNORMAL HIGH (ref 0.0–40.0)

## 2013-04-02 LAB — MICROALBUMIN / CREATININE URINE RATIO: Microalb Creat Ratio: 3.8 mg/g (ref 0.0–30.0)

## 2013-04-02 LAB — LDL CHOLESTEROL, DIRECT: Direct LDL: 12.8 mg/dL

## 2013-04-02 NOTE — Assessment & Plan Note (Signed)
Going to cardiac rehab twice weekly He has lost some weight-- has a long way to go Continue same meds

## 2013-04-02 NOTE — Assessment & Plan Note (Signed)
No sxs- Check labs Continue risk factor modification

## 2013-04-02 NOTE — Assessment & Plan Note (Signed)
Weight is the main problem Advised aggressive weight loss Check labs today

## 2013-04-02 NOTE — Assessment & Plan Note (Signed)
Check tsh today

## 2013-04-02 NOTE — Progress Notes (Signed)
Patient ID: [name], male   DOB: 07-11-1946, 67 y.o.   MRN: [id number]   patient comes in for followup of multiple medical problems including type 2 diabetes, hyperlipidemia, hypertension. The patient does not check blood sugar or blood pressure at home. The patetient does not follow an exercise or diet program. The patient denies any polyuria, polydipsia.  In the past the patient has gone to diabetic treatment center. The patient is tolerating medications  Without difficulty. The patient does admit to medication compliance.   Past Medical History  Diagnosis Date  . CORONARY ARTERY DISEASE 04/12/2007  . DIABETES MELLITUS, TYPE II 04/16/2007  . HYPERLIPIDEMIA 04/16/2007  . HYPERTENSION 04/12/2007  . HYPOTHYROIDISM 04/12/2007  . MYOCARDIAL INFARCTION, HX OF 04/12/2007  . OBESITY 06/16/2009  . SLEEP APNEA 04/12/2007  . ULCERATIVE COLITIS, LEFT SIDED 11/23/2010  . Ischemic cardiomyopathy     cath 35-40%  . Atrial fibrillation     initial diagnoses 2012  . B12 deficiency     per patient    History   Social History  . Marital Status: Married    Spouse Name: N/A    Number of Children: 1  . Years of Education: N/A   Occupational History  . RETIRED    Social History Main Topics  . Smoking status: Former Smoker    Types: Cigarettes    Quit date: 04/14/1967  . Smokeless tobacco: Never Used  . Alcohol Use: Yes     Comment: social, beer once a month or two - light  . Drug Use: No  . Sexually Active: Not on file   Other Topics Concern  . Not on file   Social History Narrative   PT DOES NOT EXERCISE ON A REGULAR BASIS.Marland Kitchen   DAILY CAFFEINE USE.   GRANDDAUGHTER (MS. PETTIGREW) IS AN RN ON 2000 @ [facility]    Past Surgical History  Procedure Laterality Date  . Coronary stent placement  2008    LAD   . Cardiac catheterization  10/2002    STENT  . Tonsillectomy      Family History  Problem Relation Age of Onset  . Heart attack Mother   . Heart disease Father     H/O CAD, CABG, VALVE SURGERY   . Hypertension Brother   . Obesity Brother   . Stomach cancer Maternal Aunt   . Stomach cancer Paternal Grandmother     ? colon     No Known Allergies  Current Outpatient Prescriptions on File Prior to Visit  Medication Sig Dispense Refill  . amLODipine (NORVASC) 10 MG tablet TAKE ONE (1) TABLET EACH DAY  30 tablet  6  . carvedilol (COREG) 12.5 MG tablet TAKE ONE TABLET MID DAY. TAKING 25MG    CAVEDILOL ALSO  30 tablet  6  . carvedilol (COREG) 25 MG tablet TAKE ONE TABLET TWICE DAILY WITH A MEAL  60 tablet  6  . clopidogrel (PLAVIX) 75 MG tablet TAKE ONE (1) TABLET EACH DAY  30 tablet  5  . fexofenadine (ALLEGRA) 180 MG tablet Take 1 tablet (180 mg total) by mouth daily.      . fish oil-omega-3 fatty acids 1000 MG capsule Take 2 g by mouth daily.       . furosemide (LASIX) 40 MG tablet TAKE ONE TABLET ONCE DAILY  30 tablet  6  . glipiZIDE (GLUCOTROL) 5 MG tablet Take 0.5 tablets (2.5 mg total) by mouth daily.  30 tablet  3  . Glucosamine-Chondroitin (OSTEO BI-FLEX REGULAR  STRENGTH) 250-200 MG TABS Take 1 tablet by mouth 2 (two) times daily.       . mesalamine (APRISO) 0.375 G 24 hr capsule Take 4 capsules by mouth once daily.  120 capsule  8  . metFORMIN (GLUCOPHAGE) 500 MG tablet Take 1 tablet (500 mg total) by mouth daily with breakfast.  90 tablet  1  . mometasone (NASONEX) 50 MCG/ACT nasal spray Two sprays into each nostril in the morning once daily  17 g  1  . olmesartan (BENICAR) 40 MG tablet TAKE ONE (1) TABLET EACH DAY  30 tablet  1  . ONE TOUCH ULTRA TEST test strip 1 each by Other route daily.       . potassium chloride SA (KLOR-CON M20) 20 MEQ tablet Take 1 tablet (20 mEq total) by mouth daily.  90 tablet  3  . PRADAXA 150 MG CAPS TAKE ONE (1) CAPSULE BY MOUTH TWICE A DAY. (EVERY 12 HOURS.)  60 capsule  5  . sitaGLIPtin (JANUVIA) 100 MG tablet TAKE ONE (1) TABLET EACH DAY  30 tablet  0  . STUDY MEDICATION REVEAL STUDY      . SYNTHROID 50 MCG tablet TAKE ONE (1) TABLET BY  MOUTH EVERY      DAY  90 tablet  1   No current facility-administered medications on file prior to visit.     patient denies chest pain, shortness of breath, orthopnea. Denies lower extremity edema, abdominal pain, change in appetite, change in bowel movements. Patient denies rashes, musculoskeletal complaints. No other specific complaints in a complete review of systems.   BP 138/90  Pulse 80  Temp(Src) 97.8 F (36.6 C) (Oral)  Wt 295 lb (133.811 kg)  BMI 44.86 kg/m2   well-developed well-nourished male in no acute distress. HEENT exam atraumatic, normocephalic, neck supple without jugular venous distention. Chest clear to auscultation cardiac exam S1-S2 are regular. Abdominal exam overweight with bowel sounds, soft and nontender. Extremities no edema. Neurologic exam is alert with a normal gait.

## 2013-04-03 ENCOUNTER — Other Ambulatory Visit: Payer: Self-pay | Admitting: Cardiovascular Disease

## 2013-04-05 ENCOUNTER — Encounter (HOSPITAL_COMMUNITY)
Admission: RE | Admit: 2013-04-05 | Discharge: 2013-04-05 | Disposition: A | Discharge status: Home / Self Care | Payer: Self-pay | Source: Ambulatory Visit | Attending: Cardiovascular Disease | Admitting: Cardiovascular Disease

## 2013-04-08 ENCOUNTER — Encounter (HOSPITAL_COMMUNITY)
Admission: RE | Admit: 2013-04-08 | Discharge: 2013-04-08 | Disposition: A | Discharge status: Home / Self Care | Payer: Self-pay | Source: Ambulatory Visit | Attending: Cardiovascular Disease | Admitting: Cardiovascular Disease

## 2013-04-10 ENCOUNTER — Encounter (HOSPITAL_COMMUNITY)
Admission: RE | Admit: 2013-04-10 | Discharge: 2013-04-10 | Disposition: A | Discharge status: Home / Self Care | Payer: Self-pay | Source: Ambulatory Visit | Attending: Cardiovascular Disease | Admitting: Cardiovascular Disease

## 2013-04-10 DIAGNOSIS — Z9861 Coronary angioplasty status: Secondary | ICD-10-CM | POA: Insufficient documentation

## 2013-04-10 DIAGNOSIS — I2510 Atherosclerotic heart disease of native coronary artery without angina pectoris: Secondary | ICD-10-CM | POA: Insufficient documentation

## 2013-04-10 DIAGNOSIS — E119 Type 2 diabetes mellitus without complications: Secondary | ICD-10-CM | POA: Insufficient documentation

## 2013-04-10 DIAGNOSIS — I252 Old myocardial infarction: Secondary | ICD-10-CM | POA: Insufficient documentation

## 2013-04-10 DIAGNOSIS — E669 Obesity, unspecified: Secondary | ICD-10-CM | POA: Insufficient documentation

## 2013-04-10 DIAGNOSIS — E785 Hyperlipidemia, unspecified: Secondary | ICD-10-CM | POA: Insufficient documentation

## 2013-04-10 DIAGNOSIS — I10 Essential (primary) hypertension: Secondary | ICD-10-CM | POA: Insufficient documentation

## 2013-04-10 DIAGNOSIS — Z5189 Encounter for other specified aftercare: Secondary | ICD-10-CM | POA: Insufficient documentation

## 2013-04-10 DIAGNOSIS — E039 Hypothyroidism, unspecified: Secondary | ICD-10-CM | POA: Insufficient documentation

## 2013-04-12 ENCOUNTER — Encounter (HOSPITAL_COMMUNITY): Payer: Self-pay

## 2013-04-15 ENCOUNTER — Encounter (HOSPITAL_COMMUNITY)
Admission: RE | Admit: 2013-04-15 | Discharge: 2013-04-15 | Disposition: A | Discharge status: Home / Self Care | Payer: Self-pay | Source: Ambulatory Visit | Attending: Cardiovascular Disease | Admitting: Cardiovascular Disease

## 2013-04-17 ENCOUNTER — Encounter (HOSPITAL_COMMUNITY)
Admission: RE | Admit: 2013-04-17 | Discharge: 2013-04-17 | Disposition: A | Discharge status: Home / Self Care | Payer: Self-pay | Source: Ambulatory Visit | Attending: Cardiovascular Disease | Admitting: Cardiovascular Disease

## 2013-04-18 ENCOUNTER — Other Ambulatory Visit: Payer: Self-pay

## 2013-04-19 ENCOUNTER — Encounter (HOSPITAL_COMMUNITY)
Admission: RE | Admit: 2013-04-19 | Discharge: 2013-04-19 | Disposition: A | Discharge status: Home / Self Care | Payer: Self-pay | Source: Ambulatory Visit | Attending: Cardiovascular Disease | Admitting: Cardiovascular Disease

## 2013-04-22 ENCOUNTER — Encounter (HOSPITAL_COMMUNITY)
Admission: RE | Admit: 2013-04-22 | Discharge: 2013-04-22 | Disposition: A | Discharge status: Home / Self Care | Payer: Self-pay | Source: Ambulatory Visit | Attending: Cardiovascular Disease | Admitting: Cardiovascular Disease

## 2013-04-22 ENCOUNTER — Other Ambulatory Visit: Payer: Self-pay | Admitting: Internal Medicine

## 2013-04-24 ENCOUNTER — Encounter (HOSPITAL_COMMUNITY)
Admission: RE | Admit: 2013-04-24 | Discharge: 2013-04-24 | Disposition: A | Discharge status: Home / Self Care | Payer: Self-pay | Source: Ambulatory Visit | Attending: Cardiovascular Disease | Admitting: Cardiovascular Disease

## 2013-04-26 ENCOUNTER — Encounter (HOSPITAL_COMMUNITY)
Admission: RE | Admit: 2013-04-26 | Discharge: 2013-04-26 | Disposition: A | Discharge status: Home / Self Care | Payer: Self-pay | Source: Ambulatory Visit | Attending: Cardiovascular Disease | Admitting: Cardiovascular Disease

## 2013-04-26 ENCOUNTER — Other Ambulatory Visit: Payer: Self-pay | Admitting: Cardiovascular Disease

## 2013-04-26 ENCOUNTER — Other Ambulatory Visit: Payer: Self-pay | Admitting: Internal Medicine

## 2013-04-29 ENCOUNTER — Encounter (HOSPITAL_COMMUNITY)
Admission: RE | Admit: 2013-04-29 | Discharge: 2013-04-29 | Disposition: A | Discharge status: Home / Self Care | Payer: Self-pay | Source: Ambulatory Visit | Attending: Cardiovascular Disease | Admitting: Cardiovascular Disease

## 2013-05-01 ENCOUNTER — Encounter (HOSPITAL_COMMUNITY)
Admission: RE | Admit: 2013-05-01 | Discharge: 2013-05-01 | Disposition: A | Discharge status: Home / Self Care | Payer: Self-pay | Source: Ambulatory Visit | Attending: Cardiovascular Disease | Admitting: Cardiovascular Disease

## 2013-05-01 ENCOUNTER — Telehealth: Payer: Self-pay | Admitting: Internal Medicine

## 2013-05-01 NOTE — Telephone Encounter (Signed)
[facility] calling re paperwork we sent them for CPAP supplies. She needs more info: an order with diagnosis code for CPAP supplies, demographics and insurance information, and other Medicare requirements. Please call. Thanks.

## 2013-05-02 NOTE — Telephone Encounter (Signed)
rx for cpap supplies, demographics, office notes faxed to [facility] at [phone]

## 2013-05-03 ENCOUNTER — Encounter (HOSPITAL_COMMUNITY)
Admission: RE | Admit: 2013-05-03 | Discharge: 2013-05-03 | Disposition: A | Discharge status: Home / Self Care | Payer: Self-pay | Source: Ambulatory Visit | Attending: Cardiovascular Disease | Admitting: Cardiovascular Disease

## 2013-05-06 ENCOUNTER — Encounter (HOSPITAL_COMMUNITY)
Admission: RE | Admit: 2013-05-06 | Discharge: 2013-05-06 | Disposition: A | Discharge status: Home / Self Care | Payer: Self-pay | Source: Ambulatory Visit | Attending: Cardiovascular Disease | Admitting: Cardiovascular Disease

## 2013-05-08 ENCOUNTER — Encounter (HOSPITAL_COMMUNITY)
Admission: RE | Admit: 2013-05-08 | Discharge: 2013-05-08 | Disposition: A | Discharge status: Home / Self Care | Payer: Self-pay | Source: Ambulatory Visit | Attending: Cardiovascular Disease | Admitting: Cardiovascular Disease

## 2013-05-10 ENCOUNTER — Encounter (HOSPITAL_COMMUNITY)
Admission: RE | Admit: 2013-05-10 | Discharge: 2013-05-10 | Disposition: A | Discharge status: Home / Self Care | Payer: Self-pay | Source: Ambulatory Visit | Attending: Cardiovascular Disease | Admitting: Cardiovascular Disease

## 2013-05-10 DIAGNOSIS — I10 Essential (primary) hypertension: Secondary | ICD-10-CM | POA: Insufficient documentation

## 2013-05-10 DIAGNOSIS — Z5189 Encounter for other specified aftercare: Secondary | ICD-10-CM | POA: Insufficient documentation

## 2013-05-10 DIAGNOSIS — E669 Obesity, unspecified: Secondary | ICD-10-CM | POA: Insufficient documentation

## 2013-05-10 DIAGNOSIS — I2510 Atherosclerotic heart disease of native coronary artery without angina pectoris: Secondary | ICD-10-CM | POA: Insufficient documentation

## 2013-05-10 DIAGNOSIS — E785 Hyperlipidemia, unspecified: Secondary | ICD-10-CM | POA: Insufficient documentation

## 2013-05-10 DIAGNOSIS — E119 Type 2 diabetes mellitus without complications: Secondary | ICD-10-CM | POA: Insufficient documentation

## 2013-05-10 DIAGNOSIS — Z9861 Coronary angioplasty status: Secondary | ICD-10-CM | POA: Insufficient documentation

## 2013-05-10 DIAGNOSIS — I252 Old myocardial infarction: Secondary | ICD-10-CM | POA: Insufficient documentation

## 2013-05-10 DIAGNOSIS — E039 Hypothyroidism, unspecified: Secondary | ICD-10-CM | POA: Insufficient documentation

## 2013-05-13 ENCOUNTER — Ambulatory Visit: Payer: Medicare Other | Admitting: Internal Medicine

## 2013-05-13 ENCOUNTER — Encounter (HOSPITAL_COMMUNITY): Payer: Self-pay

## 2013-05-15 ENCOUNTER — Encounter (HOSPITAL_COMMUNITY): Payer: Self-pay

## 2013-05-17 ENCOUNTER — Encounter (HOSPITAL_COMMUNITY): Payer: Self-pay

## 2013-05-20 ENCOUNTER — Encounter (HOSPITAL_COMMUNITY)
Admission: RE | Admit: 2013-05-20 | Discharge: 2013-05-20 | Disposition: A | Discharge status: Home / Self Care | Payer: Self-pay | Source: Ambulatory Visit | Attending: Cardiovascular Disease | Admitting: Cardiovascular Disease

## 2013-05-20 ENCOUNTER — Telehealth: Payer: Self-pay | Admitting: Internal Medicine

## 2013-05-20 NOTE — Telephone Encounter (Signed)
[facility] called and stated that they would need a new RX, stating that he needs; new machine, along with supplies, and include the pressure.  They also need a recent note stating that the pt is utilizing his CPAP machine and is benefiting from it. Fax; [phone]. Please assist.

## 2013-05-21 NOTE — Telephone Encounter (Signed)
[name] following up on request for CPAP. [phone]  Ext [phone]

## 2013-05-22 ENCOUNTER — Encounter (HOSPITAL_COMMUNITY)
Admission: RE | Admit: 2013-05-22 | Discharge: 2013-05-22 | Disposition: A | Discharge status: Home / Self Care | Payer: Self-pay | Source: Ambulatory Visit | Attending: Cardiovascular Disease | Admitting: Cardiovascular Disease

## 2013-05-22 NOTE — Telephone Encounter (Signed)
ok 

## 2013-05-24 ENCOUNTER — Encounter (HOSPITAL_COMMUNITY)
Admission: RE | Admit: 2013-05-24 | Discharge: 2013-05-24 | Disposition: A | Discharge status: Home / Self Care | Payer: Self-pay | Source: Ambulatory Visit | Attending: Cardiovascular Disease | Admitting: Cardiovascular Disease

## 2013-05-24 NOTE — Telephone Encounter (Signed)
Sleep study faxed

## 2013-05-24 NOTE — Telephone Encounter (Signed)
Faxed over orders, not sure what else they need but I left a message for [name] to call me back

## 2013-05-24 NOTE — Telephone Encounter (Signed)
[name] called and stated that the patient is frustrated and is inquiring about his CPAP machine and supplies. Please assist.

## 2013-05-27 ENCOUNTER — Encounter (HOSPITAL_COMMUNITY)
Admission: RE | Admit: 2013-05-27 | Discharge: 2013-05-27 | Disposition: A | Discharge status: Home / Self Care | Payer: Self-pay | Source: Ambulatory Visit | Attending: Cardiovascular Disease | Admitting: Cardiovascular Disease

## 2013-05-29 ENCOUNTER — Other Ambulatory Visit: Payer: Self-pay | Admitting: Internal Medicine

## 2013-05-29 ENCOUNTER — Encounter (HOSPITAL_COMMUNITY)
Admission: RE | Admit: 2013-05-29 | Discharge: 2013-05-29 | Disposition: A | Discharge status: Home / Self Care | Payer: Self-pay | Source: Ambulatory Visit | Attending: Cardiovascular Disease | Admitting: Cardiovascular Disease

## 2013-05-31 ENCOUNTER — Encounter (HOSPITAL_COMMUNITY)
Admission: RE | Admit: 2013-05-31 | Discharge: 2013-05-31 | Disposition: A | Discharge status: Home / Self Care | Payer: Self-pay | Source: Ambulatory Visit | Attending: Cardiovascular Disease | Admitting: Cardiovascular Disease

## 2013-06-03 ENCOUNTER — Ambulatory Visit (INDEPENDENT_AMBULATORY_CARE_PROVIDER_SITE_OTHER): Payer: Medicare Other | Admitting: Internal Medicine

## 2013-06-03 ENCOUNTER — Encounter: Payer: Self-pay | Admitting: Internal Medicine

## 2013-06-03 ENCOUNTER — Encounter (HOSPITAL_COMMUNITY): Payer: Self-pay

## 2013-06-03 VITALS — BP 122/88 | HR 81 | Temp 98.2°F | Resp 12 | Ht 68.0 in | Wt 296.0 lb

## 2013-06-03 VITALS — BP 110/72 | HR 76 | Temp 98.2°F | Wt 295.0 lb

## 2013-06-03 DIAGNOSIS — E538 Deficiency of other specified B group vitamins: Secondary | ICD-10-CM

## 2013-06-03 DIAGNOSIS — E119 Type 2 diabetes mellitus without complications: Secondary | ICD-10-CM

## 2013-06-03 DIAGNOSIS — G4730 Sleep apnea, unspecified: Secondary | ICD-10-CM | POA: Diagnosis not present

## 2013-06-03 DIAGNOSIS — Z23 Encounter for immunization: Secondary | ICD-10-CM | POA: Diagnosis not present

## 2013-06-03 MED ORDER — GLIPIZIDE 5 MG PO TABS: 5.0000 mg | ORAL_TABLET | Freq: Two times a day (BID) | ORAL | Status: DC

## 2013-06-03 MED ORDER — CYANOCOBALAMIN 1000 MCG/ML IJ SOLN
1000.0000 ug | Freq: Once | INTRAMUSCULAR | Status: AC
  Administered 2013-06-03: 1000 ug via INTRAMUSCULAR

## 2013-06-03 MED ORDER — METFORMIN HCL 500 MG PO TABS: 1000.0000 mg | ORAL_TABLET | Freq: Two times a day (BID) | ORAL | Status: DC

## 2013-06-03 NOTE — Assessment & Plan Note (Signed)
Discussed at length- he has severe osa He needs CPAP

## 2013-06-03 NOTE — Progress Notes (Signed)
OSA-  Pt. Has had difficulty obtaining cpap machine. He has used CPAP for years and has had good results. See previous docuentation of OSA. He has not lost weight.  CAD- going to cardiac rehab.  Reviewed pmh, psh, sochx  Exam_ vitals reviewed Chest- cta cv- reg rate abd- obese

## 2013-06-03 NOTE — Addendum Note (Signed)
Addended by: Townsend Roger D on: 06/03/2013 09:12 AM   Modules accepted: Orders

## 2013-06-03 NOTE — Patient Instructions (Addendum)
Continue Januvia 100 mg in am. Continue Glipizide but increase to 5 mg twice a day with breakfast and dinner. Continue Metformin but increase to 1000 mg twice a day with breakfast and dinner. Return in 1 month with your sugar log.  PATIENT INSTRUCTIONS FOR TYPE 2 DIABETES:  **Please join MyChart!** - see attached instructions about how to join   DIET AND EXERCISE Diet and exercise is an important part of diabetic treatment.  We recommended aerobic exercise in the form of brisk walking (working between 40-60% of maximal aerobic capacity, similar to brisk walking) for 150 minutes per week (such as 30 minutes five days per week) along with 3 times per week performing 'resistance' training (using various gauge rubber tubes with handles) 5-10 exercises involving the major muscle groups (upper body, lower body and core) performing 10-15 repetitions (or near fatigue) each exercise. Start at half the above goal but build slowly to reach the above goals. If limited by weight, joint pain, or disability, we recommend daily walking in a swimming pool with water up to waist to reduce pressure from joints while allow for adequate exercise.    BLOOD GLUCOSES Monitoring your blood glucoses is important for continued management of your diabetes. Please check your blood glucoses 2-4 times a day: fasting, before meals and at bedtime (you can rotate these measurements - e.g. one day check before the 3 meals, the next day check before 2 of the meals and before bedtime, etc.   HYPOGLYCEMIA (low blood sugar) Hypoglycemia is usually a reaction to not eating, exercising, or taking too much insulin/ other diabetes drugs.  Symptoms include tremors, sweating, hunger, confusion, headache, etc. Treat IMMEDIATELY with 15 grams of Carbs:   4 glucose tablets    cup regular juice/soda   2 tablespoons raisins   4 teaspoons sugar   1 tablespoon honey Recheck blood glucose in 15 mins and repeat above if still symptomatic/blood  glucose <100. Please contact our office at [phone] if you have questions about how to next handle your insulin.  RECOMMENDATIONS TO REDUCE YOUR RISK OF DIABETIC COMPLICATIONS: * Take your prescribed MEDICATION(S). * Follow a DIABETIC diet: Complex carbs, fiber rich foods, heart healthy fish twice weekly, (monounsaturated and polyunsaturated) fats * AVOID saturated/trans fats, high fat foods, >2,300 mg salt per day. * EXERCISE at least 5 times a week for 30 minutes or preferably daily.  * DO NOT SMOKE OR DRINK more than 1 drink a day. * Check your FEET every day. Do not wear tightfitting shoes. Contact us if you develop an ulcer * See your EYE doctor once a year or more if needed * Get a FLU shot once a year * Get a PNEUMONIA vaccine once before and once after age 14 years  GOALS:  * Your Hemoglobin A1c of <7%  * fasting sugars need to be <130 * after meals sugars need to be <180 (2h after you start eating) * Your Systolic BP should be 540 or lower  * Your Diastolic BP should be 80 or lower  * Your HDL (Good Cholesterol) should be 40 or higher  * Your LDL (Bad Cholesterol) should be 100 or lower  * Your Triglycerides should be 150 or lower  * Your Urine microalbumin (kidney function) should be <30 * Your Body Mass Index should be 25 or lower   We will be glad to help you achieve these goals. Our telephone number is: [phone].

## 2013-06-03 NOTE — Progress Notes (Signed)
Patient ID: [name], male   DOB: [birth year], 67 y.o.   MRN: [id number]  HPI: [name] is a 67 y.o.-year-old male, referred by his PCP, Dr. Leanne Chang, for management of DM2, non-insulin-dependent, uncontrolled, with complications (CAD, s/p MI).  Patient has been diagnosed with diabetes in ~2004; he has not been on insulin before. Last hemoglobin A1c was: Lab Results  Component Value Date   HGBA1C 12.4* 04/02/2013  He started to change his eating habits since this measurement.   Pt is on a regimen of: - Glipizide 2.5 mg  - Metformin 500 mg once a day in am on empty  - Januvia 100 mg daily added last year He takes these every day.  Pt checks his sugars "once in a while": - am: 70-300 - highest of the day  No lows. Lowest sugar was 70s - skipping a meal; he has hypoglycemia awareness at mid60s. Highest sugar was 338 at last visit with PCP.  Pt's meals are: - Breakfast: egg, cereal, whole wheat - Lunch: salad  - Dinner: chicken breast/fish/steak + salad - Snacks: 2 a day: grapes, prev. Bananas, diet sodas, diet green tea He goes to [facility] place - 3x a week (MWF) - 1h/session He is seeing the nutritionist [facility]   - no CKD, last BUN/creatinine:  Lab Results  Component Value Date   BUN 12 04/02/2013   CREATININE 0.8 04/02/2013  he is on Benicar 40.  - last set of lipids: Lab Results  Component Value Date   CHOL 111 04/02/2013   HDL 72.30 04/02/2013   LDLCALC 5 11/01/2011   LDLDIRECT 12.8 04/02/2013   TRIG 230.0* 04/02/2013   CHOLHDL 2 04/02/2013  He is on omega 3 fatty acids.  - last eye exam was in last year. No DR.  - no numbness and tingling in his feet.  I reviewed his chart and he also has a history of hypothyroidism, hyperlipidemia, OSA-on CPAP, very compliant, A. Fib-on Plavix and pradaxa, IBD, GERD, vitamin B12 deficiency, obesity.  No FH of DM.  ROS: Constitutional: no weight gain/loss, no fatigue, no subjective  hyperthermia/hypothermia; +nocturia >1 Eyes: no blurry vision, no xerophthalmia ENT: no sore throat, no nodules palpated in throat, no dysphagia/odynophagia, no hoarseness; + decreased hearing, tinnitus Cardiovascular: no CP/SOB/palpitations/+ leg swelling Respiratory: no cough/SOB Gastrointestinal: no N/V/D/C Musculoskeletal: no muscle/joint aches Skin: no rashes; + Easy bruising Neurological: no tremors/numbness/tingling/dizziness Psychiatric: no depression/anxiety Low libido, and difficulty with erections  Past Medical History  Diagnosis Date  . CORONARY ARTERY DISEASE 04/12/2007  . DIABETES MELLITUS, TYPE II 04/16/2007  . HYPERLIPIDEMIA 04/16/2007  . HYPERTENSION 04/12/2007  . HYPOTHYROIDISM 04/12/2007  . MYOCARDIAL INFARCTION, HX OF 04/12/2007  . OBESITY 06/16/2009  . SLEEP APNEA 04/12/2007  . ULCERATIVE COLITIS, LEFT SIDED 11/23/2010  . Ischemic cardiomyopathy     cath 35-40%  . Atrial fibrillation     initial diagnoses 2012  . B12 deficiency     per patient   Past Surgical History  Procedure Laterality Date  . Coronary stent placement  2008    LAD   . Cardiac catheterization  10/2002    STENT  . Tonsillectomy     History   Social History  . Marital Status: Married    Spouse Name: N/A    Number of Children: 1, 22 y/o   Occupational History  . RETIRED    Social History Main Topics  . Smoking status: Former Smoker    Types: Cigarettes  Quit date: 04/14/1967  . Smokeless tobacco: Never Used  . Alcohol Use: Yes     Comment: social, beer once a month or two - light  . Drug Use: No   Social History Narrative   PT DOES NOT EXERCISE ON A REGULAR BASIS.Marland Kitchen   DAILY CAFFEINE USE.   GRANDDAUGHTER (MS. PETTIGREW) IS AN RN ON 2000 @ [facility]   Current Outpatient Prescriptions on File Prior to Visit  Medication Sig Dispense Refill  . amLODipine (NORVASC) 10 MG tablet TAKE ONE (1) TABLET EACH DAY  90 tablet  2  . BENICAR 40 MG tablet TAKE ONE (1) TABLET EACH DAY  30 tablet  5  .  carvedilol (COREG) 12.5 MG tablet TAKE ONE TABLET MID DAY. TAKING 25MG    CAVEDILOL ALSO  30 tablet  6  . carvedilol (COREG) 25 MG tablet TAKE ONE TABLET BY MOUTH TWICE A DAY WITH A MEAL  60 tablet  3  . clopidogrel (PLAVIX) 75 MG tablet TAKE ONE (1) TABLET EACH DAY  30 tablet  5  . fexofenadine (ALLEGRA) 180 MG tablet Take 1 tablet (180 mg total) by mouth daily.      . fish oil-omega-3 fatty acids 1000 MG capsule Take 2 g by mouth daily.       . furosemide (LASIX) 40 MG tablet TAKE ONE (1) TABLET EACH DAY  30 tablet  3  . Glucosamine-Chondroitin (OSTEO BI-FLEX REGULAR STRENGTH) 250-200 MG TABS Take 1 tablet by mouth 2 (two) times daily.       Marland Kitchen JANUVIA 100 MG tablet TAKE ONE (1) TABLET EACH DAY  30 tablet  5  . mesalamine (APRISO) 0.375 G 24 hr capsule Take 4 capsules by mouth once daily.  120 capsule  8  . mometasone (NASONEX) 50 MCG/ACT nasal spray Two sprays into each nostril in the morning once daily  17 g  1  . ONE TOUCH ULTRA TEST test strip 1 each by Other route daily.       . potassium chloride SA (KLOR-CON M20) 20 MEQ tablet Take 1 tablet (20 mEq total) by mouth daily.  90 tablet  3  . PRADAXA 150 MG CAPS TAKE ONE (1) CAPSULE BY MOUTH TWICE A DAY. (EVERY 12 HOURS.)  60 capsule  5  . STUDY MEDICATION REVEAL STUDY      . SYNTHROID 50 MCG tablet TAKE ONE (1) TABLET BY MOUTH EVERY      DAY  90 tablet  1   No current facility-administered medications on file prior to visit.   No Known Allergies  Family History  Problem Relation Age of Onset  . Heart attack Mother   . Heart disease Father     H/O CAD, CABG, VALVE SURGERY  . Hypertension Brother   . Obesity Brother   . Stomach cancer Maternal Aunt   . Stomach cancer Paternal Grandmother     ? colon    PE: BP 122/88  Pulse 81  Temp(Src) 98.2 F (36.8 C) (Oral)  Resp 12  Ht 5' 8"  (1.727 m)  Wt 296 lb (134.265 kg)  BMI 45.02 kg/m2  SpO2 95% Wt Readings from Last 3 Encounters:  06/03/13 296 lb (134.265 kg)  06/03/13 295 lb  (133.811 kg)  04/02/13 295 lb (133.811 kg)   Constitutional: obese, protuberant abdomen, in NAD Eyes: PERRLA, EOMI, no exophthalmos ENT: moist mucous membranes, no thyromegaly, no cervical lymphadenopathy Cardiovascular: irregularly irregular, regular rate, No MRG; + bilateral leg swelling-wears compression hoses Respiratory: CTA B Gastrointestinal:  abdomen soft, NT, ND, BS+ Musculoskeletal: no deformities, strength intact in all 4 Skin: moist, warm, no rashes Neurological: no tremor with outstretched hands, DTR normal in all 4  ASSESSMENT: 1. DM2, non-insulin-dependent, uncontrolled, with complications - CAD, s/p MI 10/2006 - s/p stent - sees [name]  PLAN:  1. Patient with long-standing, recently more uncontrolled diabetes, on a low-dose oral antidiabetic regimen, which became insufficient - We discussed about options for treatment, and I suggested to:  Increase metformin to maximum dose of 1000 mg twice a day - I advised him to start slow by adding one tablet a day every 3 days Increase glipizide to a dose of 5 mg twice a day Continue Januvia at the current dose of 100 mg daily - Strongly advised him to start checking her sugars at different times of the day - check 2 times a day, rotating checks, and write them down along with comments regarding unusual sugar readings - given sugar log and advised how to fill it and to bring it at next appt  - given foot care handout and explained the principles  - given instructions for hypoglycemia management "15-15 rule"  - advised for yearly eye exams - advised him to try to lose weight - we discussed about improving diet, including reducing the amount of fatty his diet and limiting the high glycemic index foods like grapes, bananas, and pineapples. He is getting some attrition advised from his rehabilitation visits, I wonder if he needs more formal nutritional evaluation and advice - Return to clinic in one month with sugar log

## 2013-06-05 ENCOUNTER — Encounter (HOSPITAL_COMMUNITY)
Admission: RE | Admit: 2013-06-05 | Discharge: 2013-06-05 | Disposition: A | Discharge status: Home / Self Care | Payer: Self-pay | Source: Ambulatory Visit | Attending: Cardiovascular Disease | Admitting: Cardiovascular Disease

## 2013-06-07 ENCOUNTER — Encounter (HOSPITAL_COMMUNITY)
Admission: RE | Admit: 2013-06-07 | Discharge: 2013-06-07 | Disposition: A | Discharge status: Home / Self Care | Payer: Self-pay | Source: Ambulatory Visit | Attending: Cardiovascular Disease | Admitting: Cardiovascular Disease

## 2013-06-10 ENCOUNTER — Encounter (HOSPITAL_COMMUNITY): Payer: Medicare Other

## 2013-06-12 ENCOUNTER — Encounter (HOSPITAL_COMMUNITY)
Admission: RE | Admit: 2013-06-12 | Discharge: 2013-06-12 | Disposition: A | Discharge status: Home / Self Care | Payer: Self-pay | Source: Ambulatory Visit | Attending: Cardiovascular Disease | Admitting: Cardiovascular Disease

## 2013-06-12 DIAGNOSIS — Z5189 Encounter for other specified aftercare: Secondary | ICD-10-CM | POA: Insufficient documentation

## 2013-06-12 DIAGNOSIS — I252 Old myocardial infarction: Secondary | ICD-10-CM | POA: Insufficient documentation

## 2013-06-12 DIAGNOSIS — E119 Type 2 diabetes mellitus without complications: Secondary | ICD-10-CM | POA: Insufficient documentation

## 2013-06-12 DIAGNOSIS — E669 Obesity, unspecified: Secondary | ICD-10-CM | POA: Insufficient documentation

## 2013-06-12 DIAGNOSIS — E785 Hyperlipidemia, unspecified: Secondary | ICD-10-CM | POA: Insufficient documentation

## 2013-06-12 DIAGNOSIS — Z9861 Coronary angioplasty status: Secondary | ICD-10-CM | POA: Insufficient documentation

## 2013-06-12 DIAGNOSIS — E039 Hypothyroidism, unspecified: Secondary | ICD-10-CM | POA: Insufficient documentation

## 2013-06-12 DIAGNOSIS — I2510 Atherosclerotic heart disease of native coronary artery without angina pectoris: Secondary | ICD-10-CM | POA: Insufficient documentation

## 2013-06-12 DIAGNOSIS — I10 Essential (primary) hypertension: Secondary | ICD-10-CM | POA: Insufficient documentation

## 2013-06-14 ENCOUNTER — Encounter (HOSPITAL_COMMUNITY)
Admission: RE | Admit: 2013-06-14 | Discharge: 2013-06-14 | Disposition: A | Discharge status: Home / Self Care | Payer: Self-pay | Source: Ambulatory Visit | Attending: Cardiovascular Disease | Admitting: Cardiovascular Disease

## 2013-06-17 ENCOUNTER — Encounter (HOSPITAL_COMMUNITY)
Admission: RE | Admit: 2013-06-17 | Discharge: 2013-06-17 | Disposition: A | Discharge status: Home / Self Care | Payer: Self-pay | Source: Ambulatory Visit | Attending: Cardiovascular Disease | Admitting: Cardiovascular Disease

## 2013-06-19 ENCOUNTER — Encounter (HOSPITAL_COMMUNITY)
Admission: RE | Admit: 2013-06-19 | Discharge: 2013-06-19 | Disposition: A | Discharge status: Home / Self Care | Payer: Self-pay | Source: Ambulatory Visit | Attending: Cardiovascular Disease | Admitting: Cardiovascular Disease

## 2013-06-21 ENCOUNTER — Encounter (HOSPITAL_COMMUNITY)
Admission: RE | Admit: 2013-06-21 | Discharge: 2013-06-21 | Disposition: A | Discharge status: Home / Self Care | Payer: Self-pay | Source: Ambulatory Visit | Attending: Cardiovascular Disease | Admitting: Cardiovascular Disease

## 2013-06-24 ENCOUNTER — Encounter (HOSPITAL_COMMUNITY)
Admission: RE | Admit: 2013-06-24 | Discharge: 2013-06-24 | Disposition: A | Discharge status: Home / Self Care | Payer: Self-pay | Source: Ambulatory Visit | Attending: Cardiovascular Disease | Admitting: Cardiovascular Disease

## 2013-06-26 ENCOUNTER — Encounter (HOSPITAL_COMMUNITY)
Admission: RE | Admit: 2013-06-26 | Discharge: 2013-06-26 | Disposition: A | Discharge status: Home / Self Care | Payer: Self-pay | Source: Ambulatory Visit | Attending: Cardiovascular Disease | Admitting: Cardiovascular Disease

## 2013-06-28 ENCOUNTER — Encounter (HOSPITAL_COMMUNITY)
Admission: RE | Admit: 2013-06-28 | Discharge: 2013-06-28 | Disposition: A | Discharge status: Home / Self Care | Payer: Self-pay | Source: Ambulatory Visit | Attending: Cardiovascular Disease | Admitting: Cardiovascular Disease

## 2013-07-01 ENCOUNTER — Encounter (HOSPITAL_COMMUNITY)
Admission: RE | Admit: 2013-07-01 | Discharge: 2013-07-01 | Disposition: A | Discharge status: Home / Self Care | Payer: Self-pay | Source: Ambulatory Visit | Attending: Cardiovascular Disease | Admitting: Cardiovascular Disease

## 2013-07-03 ENCOUNTER — Encounter (HOSPITAL_COMMUNITY)
Admission: RE | Admit: 2013-07-03 | Discharge: 2013-07-03 | Disposition: A | Discharge status: Home / Self Care | Payer: Self-pay | Source: Ambulatory Visit | Attending: Cardiovascular Disease | Admitting: Cardiovascular Disease

## 2013-07-05 ENCOUNTER — Ambulatory Visit: Payer: Medicare Other | Admitting: Internal Medicine

## 2013-07-05 ENCOUNTER — Encounter (HOSPITAL_COMMUNITY)
Admission: RE | Admit: 2013-07-05 | Discharge: 2013-07-05 | Disposition: A | Discharge status: Home / Self Care | Payer: Self-pay | Source: Ambulatory Visit | Attending: Cardiovascular Disease | Admitting: Cardiovascular Disease

## 2013-07-08 ENCOUNTER — Ambulatory Visit (INDEPENDENT_AMBULATORY_CARE_PROVIDER_SITE_OTHER): Payer: Medicare Other | Admitting: Internal Medicine

## 2013-07-08 ENCOUNTER — Encounter (HOSPITAL_COMMUNITY)
Admission: RE | Admit: 2013-07-08 | Discharge: 2013-07-08 | Disposition: A | Discharge status: Home / Self Care | Payer: Self-pay | Source: Ambulatory Visit | Attending: Cardiovascular Disease | Admitting: Cardiovascular Disease

## 2013-07-08 ENCOUNTER — Telehealth: Payer: Self-pay | Admitting: Internal Medicine

## 2013-07-08 ENCOUNTER — Encounter: Payer: Self-pay | Admitting: Internal Medicine

## 2013-07-08 VITALS — Temp 98.2°F | Resp 12 | Wt 297.0 lb

## 2013-07-08 DIAGNOSIS — E119 Type 2 diabetes mellitus without complications: Secondary | ICD-10-CM | POA: Diagnosis not present

## 2013-07-08 MED ORDER — METFORMIN HCL 1000 MG PO TABS: 1000.0000 mg | ORAL_TABLET | Freq: Two times a day (BID) | ORAL | Status: DC

## 2013-07-08 NOTE — Progress Notes (Signed)
Patient ID: [name], male   DOB: 10-Jul-1946, 67 y.o.   MRN: [id number]  HPI: [name] is a 67 y.o.-year-old male, returning for DM2, dx 2004, non-insulin-dependent, uncontrolled, with complications (CAD, s/p MI).  Last hemoglobin A1c was: Lab Results  Component Value Date   HGBA1C 12.4* 04/02/2013  He started to change his eating habits since this measurement.   Pt is on a regimen of: - Glipizide 5 mg bid (increased 2.5 mg daily at last visit) - Metformin 500 mg bid (increased from 500 mg daily at last visit) - did not increase to 1000 mg bid as advised... - Januvia 100 mg daily  He does not miss doses.  Pt checks his sugars 2-3 - sugars much better: - am: 70-300 >> 157-169 - 2h after breakfast: 169-193 - before lunch 144 - before dinner: 124-162, lately on the low end - 2h after dinner: 142  No lows.; he has hypoglycemia awareness at mid60s. Highest sugar was 193.  Pt's meals are improved - cut back on red meat, less fried foods: He goes to [employer] place - 3x a week (MWF) - 1h/session He is seeing the nutritionist at the [facility]   - no CKD, last BUN/creatinine:  Lab Results  Component Value Date   BUN 12 04/02/2013   CREATININE 0.8 04/02/2013  he is on Benicar 40.  - last set of lipids: Lab Results  Component Value Date   CHOL 111 04/02/2013   HDL 72.30 04/02/2013   LDLCALC 5 11/01/2011   LDLDIRECT 12.8 04/02/2013   TRIG 230.0* 04/02/2013   CHOLHDL 2 04/02/2013  He is on omega 3 fatty acids.  - last eye exam was in last year. No DR.  - no numbness and tingling in his feet.  I reviewed his chart and he also has a history of hypothyroidism, hyperlipidemia, OSA-on CPAP, very compliant, A. Fib-on Plavix and pradaxa, IBD, GERD, vitamin B12 deficiency - on im B12, obesity.  ROS: Constitutional: no weight gain/loss, no fatigue, no subjective hyperthermia/hypothermia;  Eyes: no blurry vision, no xerophthalmia ENT: no sore throat, no nodules  palpated in throat, no dysphagia/odynophagia, no hoarseness;  Cardiovascular: no CP/SOB/palpitations/+ leg swelling Respiratory: no cough/SOB Gastrointestinal: no N/V/D/C Musculoskeletal: no muscle/joint aches Skin: no rashes;  Neurological: no tremors/numbness/tingling/dizziness Psychiatric: no depression/anxiety  I reviewed pt's medications, allergies, PMH, social hx, family hx and no changes required, except as mentioned above.  PE: Temp(Src) 98.2 F (36.8 C) (Oral)  Resp 12  Wt 297 lb (134.718 kg)  BMI 45.17 kg/m2 Wt Readings from Last 3 Encounters:  07/08/13 297 lb (134.718 kg)  06/03/13 296 lb (134.265 kg)  06/03/13 295 lb (133.811 kg)   Constitutional: obese, protuberant abdomen, in NAD Eyes: PERRLA, EOMI, no exophthalmos ENT: moist mucous membranes, no thyromegaly, no cervical lymphadenopathy Cardiovascular: irregularly irregular, regular rate, No MRG; + bilateral leg swelling-wears compression hoses Respiratory: CTA B Gastrointestinal: abdomen soft, NT, ND, BS+ Musculoskeletal: no deformities, strength intact in all 4 Skin: moist, warm, no rashes Neurological: no tremor with outstretched hands, DTR normal in all 4  ASSESSMENT: 1. DM2, non-insulin-dependent, uncontrolled, with complications - CAD, s/p MI 10/2006 - s/p stent - sees [name]  PLAN:  1. Patient with long-standingDM2, on oral meds; with much better control in last month, after increasing his Metformin and glipizide, but he did not understand at last visit that he needs to increase his Metformin to 1000 mg bid.  - I suggested to:  Please increase Metformin  to 1000 mg 2x a day. Continue Januvia 100 mg daily. Continue Glipizide 5 mg 2x daily. - He is doing a great job checking her sugars at different times of the day - checks 2-3 times a day, rotating checks, and writes them down  - given more sugar logs - had flu vaccine this fall - will check HbA1c today - refilled Metformin - Return to clinic in  one month with sugar log   Office Visit on 07/08/2013  Component Date Value Range Status  . Hemoglobin A1C 07/08/2013 10.5* 4.6 - 6.5 % Final   Glycemic Control Guidelines for People with Diabetes:Non Diabetic:  <6%Goal of Therapy: <7%Additional Action Suggested:  >8%    The HbA1c is much higher than I would expected... We will go by the sugar log, and proceed with the plan above.

## 2013-07-08 NOTE — Patient Instructions (Addendum)
Please increase Metformin to 1000 mg 2x a day. Continue Januvia 100 mg daily. Continue Glipizide 5 mg 2x daily. KEEP UP THE GOOD WORK! Please stop at the lab.

## 2013-07-10 ENCOUNTER — Encounter (HOSPITAL_COMMUNITY): Payer: Medicare Other

## 2013-07-12 ENCOUNTER — Encounter (HOSPITAL_COMMUNITY): Payer: Medicare Other

## 2013-07-15 ENCOUNTER — Encounter (HOSPITAL_COMMUNITY)
Admission: RE | Admit: 2013-07-15 | Discharge: 2013-07-15 | Disposition: A | Discharge status: Home / Self Care | Payer: Self-pay | Source: Ambulatory Visit | Attending: Cardiovascular Disease | Admitting: Cardiovascular Disease

## 2013-07-15 DIAGNOSIS — E119 Type 2 diabetes mellitus without complications: Secondary | ICD-10-CM | POA: Insufficient documentation

## 2013-07-15 DIAGNOSIS — I10 Essential (primary) hypertension: Secondary | ICD-10-CM | POA: Insufficient documentation

## 2013-07-15 DIAGNOSIS — Z9861 Coronary angioplasty status: Secondary | ICD-10-CM | POA: Insufficient documentation

## 2013-07-15 DIAGNOSIS — I252 Old myocardial infarction: Secondary | ICD-10-CM | POA: Insufficient documentation

## 2013-07-15 DIAGNOSIS — E785 Hyperlipidemia, unspecified: Secondary | ICD-10-CM | POA: Insufficient documentation

## 2013-07-15 DIAGNOSIS — I2510 Atherosclerotic heart disease of native coronary artery without angina pectoris: Secondary | ICD-10-CM | POA: Insufficient documentation

## 2013-07-15 DIAGNOSIS — Z5189 Encounter for other specified aftercare: Secondary | ICD-10-CM | POA: Insufficient documentation

## 2013-07-15 DIAGNOSIS — E669 Obesity, unspecified: Secondary | ICD-10-CM | POA: Insufficient documentation

## 2013-07-15 DIAGNOSIS — E039 Hypothyroidism, unspecified: Secondary | ICD-10-CM | POA: Insufficient documentation

## 2013-07-17 ENCOUNTER — Encounter (HOSPITAL_COMMUNITY)
Admission: RE | Admit: 2013-07-17 | Discharge: 2013-07-17 | Disposition: A | Discharge status: Home / Self Care | Payer: Self-pay | Source: Ambulatory Visit | Attending: Cardiovascular Disease | Admitting: Cardiovascular Disease

## 2013-07-19 ENCOUNTER — Encounter (HOSPITAL_COMMUNITY)
Admission: RE | Admit: 2013-07-19 | Discharge: 2013-07-19 | Disposition: A | Discharge status: Home / Self Care | Payer: Self-pay | Source: Ambulatory Visit | Attending: Cardiovascular Disease | Admitting: Cardiovascular Disease

## 2013-07-22 ENCOUNTER — Encounter (HOSPITAL_COMMUNITY)
Admission: RE | Admit: 2013-07-22 | Discharge: 2013-07-22 | Disposition: A | Discharge status: Home / Self Care | Payer: Self-pay | Source: Ambulatory Visit | Attending: Cardiovascular Disease | Admitting: Cardiovascular Disease

## 2013-07-24 ENCOUNTER — Encounter (HOSPITAL_COMMUNITY)
Admission: RE | Admit: 2013-07-24 | Discharge: 2013-07-24 | Disposition: A | Discharge status: Home / Self Care | Payer: Self-pay | Source: Ambulatory Visit | Attending: Cardiovascular Disease | Admitting: Cardiovascular Disease

## 2013-07-29 ENCOUNTER — Encounter (HOSPITAL_COMMUNITY)
Admission: RE | Admit: 2013-07-29 | Discharge: 2013-07-29 | Disposition: A | Discharge status: Home / Self Care | Payer: Self-pay | Source: Ambulatory Visit | Attending: Cardiovascular Disease | Admitting: Cardiovascular Disease

## 2013-07-31 ENCOUNTER — Encounter (HOSPITAL_COMMUNITY)
Admission: RE | Admit: 2013-07-31 | Discharge: 2013-07-31 | Disposition: A | Discharge status: Home / Self Care | Payer: Self-pay | Source: Ambulatory Visit | Attending: Cardiovascular Disease | Admitting: Cardiovascular Disease

## 2013-08-02 ENCOUNTER — Encounter (HOSPITAL_COMMUNITY): Payer: Medicare Other

## 2013-08-05 ENCOUNTER — Encounter (HOSPITAL_COMMUNITY)
Admission: RE | Admit: 2013-08-05 | Discharge: 2013-08-05 | Disposition: A | Discharge status: Home / Self Care | Payer: Self-pay | Source: Ambulatory Visit | Attending: Cardiovascular Disease | Admitting: Cardiovascular Disease

## 2013-08-07 ENCOUNTER — Encounter (HOSPITAL_COMMUNITY)
Admission: RE | Admit: 2013-08-07 | Discharge: 2013-08-07 | Disposition: A | Discharge status: Home / Self Care | Payer: Self-pay | Source: Ambulatory Visit | Attending: Cardiovascular Disease | Admitting: Cardiovascular Disease

## 2013-08-09 ENCOUNTER — Encounter (HOSPITAL_COMMUNITY)
Admission: RE | Admit: 2013-08-09 | Discharge: 2013-08-09 | Disposition: A | Discharge status: Home / Self Care | Payer: Self-pay | Source: Ambulatory Visit | Attending: Cardiovascular Disease | Admitting: Cardiovascular Disease

## 2013-08-12 ENCOUNTER — Encounter (HOSPITAL_COMMUNITY)
Admission: RE | Admit: 2013-08-12 | Discharge: 2013-08-12 | Disposition: A | Discharge status: Home / Self Care | Payer: Self-pay | Source: Ambulatory Visit | Attending: Cardiovascular Disease | Admitting: Cardiovascular Disease

## 2013-08-12 DIAGNOSIS — E785 Hyperlipidemia, unspecified: Secondary | ICD-10-CM | POA: Insufficient documentation

## 2013-08-12 DIAGNOSIS — E669 Obesity, unspecified: Secondary | ICD-10-CM | POA: Insufficient documentation

## 2013-08-12 DIAGNOSIS — E039 Hypothyroidism, unspecified: Secondary | ICD-10-CM | POA: Insufficient documentation

## 2013-08-12 DIAGNOSIS — E119 Type 2 diabetes mellitus without complications: Secondary | ICD-10-CM | POA: Insufficient documentation

## 2013-08-12 DIAGNOSIS — Z9861 Coronary angioplasty status: Secondary | ICD-10-CM | POA: Insufficient documentation

## 2013-08-12 DIAGNOSIS — I252 Old myocardial infarction: Secondary | ICD-10-CM | POA: Insufficient documentation

## 2013-08-12 DIAGNOSIS — I2510 Atherosclerotic heart disease of native coronary artery without angina pectoris: Secondary | ICD-10-CM | POA: Insufficient documentation

## 2013-08-12 DIAGNOSIS — I10 Essential (primary) hypertension: Secondary | ICD-10-CM | POA: Insufficient documentation

## 2013-08-12 DIAGNOSIS — Z5189 Encounter for other specified aftercare: Secondary | ICD-10-CM | POA: Insufficient documentation

## 2013-08-14 ENCOUNTER — Encounter (HOSPITAL_COMMUNITY)
Admission: RE | Admit: 2013-08-14 | Discharge: 2013-08-14 | Disposition: A | Discharge status: Home / Self Care | Payer: Self-pay | Source: Ambulatory Visit | Attending: Cardiovascular Disease | Admitting: Cardiovascular Disease

## 2013-08-16 ENCOUNTER — Encounter (HOSPITAL_COMMUNITY)
Admission: RE | Admit: 2013-08-16 | Discharge: 2013-08-16 | Disposition: A | Discharge status: Home / Self Care | Payer: Self-pay | Source: Ambulatory Visit | Attending: Cardiovascular Disease | Admitting: Cardiovascular Disease

## 2013-08-19 ENCOUNTER — Encounter (HOSPITAL_COMMUNITY): Payer: Medicare Other

## 2013-08-21 ENCOUNTER — Encounter (HOSPITAL_COMMUNITY): Payer: Medicare Other

## 2013-08-23 ENCOUNTER — Encounter (HOSPITAL_COMMUNITY)
Admission: RE | Admit: 2013-08-23 | Discharge: 2013-08-23 | Disposition: A | Discharge status: Home / Self Care | Payer: Self-pay | Source: Ambulatory Visit | Attending: Cardiovascular Disease | Admitting: Cardiovascular Disease

## 2013-08-26 ENCOUNTER — Encounter (HOSPITAL_COMMUNITY)
Admission: RE | Admit: 2013-08-26 | Discharge: 2013-08-26 | Disposition: A | Discharge status: Home / Self Care | Payer: Self-pay | Source: Ambulatory Visit | Attending: Cardiovascular Disease | Admitting: Cardiovascular Disease

## 2013-08-28 ENCOUNTER — Encounter (HOSPITAL_COMMUNITY)
Admission: RE | Admit: 2013-08-28 | Discharge: 2013-08-28 | Disposition: A | Discharge status: Home / Self Care | Payer: Self-pay | Source: Ambulatory Visit | Attending: Cardiovascular Disease | Admitting: Cardiovascular Disease

## 2013-08-28 ENCOUNTER — Encounter (INDEPENDENT_AMBULATORY_CARE_PROVIDER_SITE_OTHER): Payer: Self-pay

## 2013-08-28 ENCOUNTER — Ambulatory Visit (INDEPENDENT_AMBULATORY_CARE_PROVIDER_SITE_OTHER): Payer: Medicare Other | Admitting: Cardiovascular Disease

## 2013-08-28 ENCOUNTER — Encounter: Payer: Self-pay | Admitting: Cardiovascular Disease

## 2013-08-28 VITALS — BP 140/92 | HR 83 | Ht 69.0 in | Wt 296.0 lb

## 2013-08-28 DIAGNOSIS — I2510 Atherosclerotic heart disease of native coronary artery without angina pectoris: Secondary | ICD-10-CM

## 2013-08-28 DIAGNOSIS — E785 Hyperlipidemia, unspecified: Secondary | ICD-10-CM | POA: Diagnosis not present

## 2013-08-28 DIAGNOSIS — I4891 Unspecified atrial fibrillation: Secondary | ICD-10-CM

## 2013-08-28 NOTE — Progress Notes (Signed)
HPI:  [name] returns for followup evaluation he is a 67 year old gentleman with atrial fibrillation and coronary artery disease. He has failed cardioversion but has tolerated chronic atrial fibrillation without symptoms. He is anticoagulated with pradaxa. The patient also has a history of coronary artery disease and stent thrombosis. He had been maintained on triple therapy , but we did P2Y12 testing last year confirming his responsiveness to Plavix. Aspirin was discontinued at that time so he is maintained on anticoagulation with Pradaxa and antiplatelet therapy with clopidogrel.  The patient is doing fairly well. He complains of left knee pain which is his primary physical limitation. He denies shortness of breath with normal activities. He's had no chest pain or pressure. He denies palpitations, lightheadedness, orthopnea, or PND. He does have chronic leg edema and wears compression stockings.  Outpatient Encounter Prescriptions as of 08/28/2013  Medication Sig  . amLODipine (NORVASC) 10 MG tablet TAKE ONE (1) TABLET EACH DAY  . BENICAR 40 MG tablet TAKE ONE (1) TABLET EACH DAY  . carvedilol (COREG) 12.5 MG tablet TAKE ONE TABLET MID DAY. TAKING 25MG    CAVEDILOL ALSO  . carvedilol (COREG) 25 MG tablet TAKE ONE TABLET BY MOUTH TWICE A DAY WITH A MEAL  . fexofenadine (ALLEGRA) 180 MG tablet Take 1 tablet (180 mg total) by mouth daily.  . fish oil-omega-3 fatty acids 1000 MG capsule Take 2 g by mouth daily.   . furosemide (LASIX) 40 MG tablet TAKE ONE (1) TABLET EACH DAY  . glipiZIDE (GLUCOTROL) 5 MG tablet Take 1 tablet (5 mg total) by mouth 2 (two) times daily before a meal.  . Glucosamine-Chondroitin (OSTEO BI-FLEX REGULAR STRENGTH) 250-200 MG TABS Take 1 tablet by mouth 2 (two) times daily.   Marland Kitchen JANUVIA 100 MG tablet TAKE ONE (1) TABLET EACH DAY  . mesalamine (APRISO) 0.375 G 24 hr capsule Take 4 capsules by mouth once daily.  . metFORMIN (GLUCOPHAGE) 1000 MG tablet Take 1 tablet  (1,000 mg total) by mouth 2 (two) times daily with a meal.  . ONE TOUCH ULTRA TEST test strip 1 each by Other route daily.   . potassium chloride SA (KLOR-CON M20) 20 MEQ tablet Take 1 tablet (20 mEq total) by mouth daily.  Marland Kitchen PRADAXA 150 MG CAPS TAKE ONE (1) CAPSULE BY MOUTH TWICE A DAY. (EVERY 12 HOURS.)  . STUDY MEDICATION REVEAL STUDY  . SYNTHROID 50 MCG tablet TAKE ONE (1) TABLET BY MOUTH EVERY      DAY  . [DISCONTINUED] clopidogrel (PLAVIX) 75 MG tablet TAKE ONE (1) TABLET EACH DAY  . [DISCONTINUED] mometasone (NASONEX) 50 MCG/ACT nasal spray Two sprays into each nostril in the morning once daily    No Known Allergies  Past Medical History  Diagnosis Date  . CORONARY ARTERY DISEASE 04/12/2007  . DIABETES MELLITUS, TYPE II 04/16/2007  . HYPERLIPIDEMIA 04/16/2007  . HYPERTENSION 04/12/2007  . HYPOTHYROIDISM 04/12/2007  . MYOCARDIAL INFARCTION, HX OF 04/12/2007  . OBESITY 06/16/2009  . SLEEP APNEA 04/12/2007  . ULCERATIVE COLITIS, LEFT SIDED 11/23/2010  . Ischemic cardiomyopathy     cath 35-40%  . Atrial fibrillation     initial diagnoses 2012  . B12 deficiency     per patient    ROS: Negative except as per HPI  BP 140/92  Pulse 83  Ht 5' 9"  (1.753 m)  Wt 296 lb (134.265 kg)  BMI 43.69 kg/m2  PHYSICAL EXAM: Pt is alert and oriented, pleasant obese male in NAD HEENT: normal  Neck: JVP - normal, carotids 2+= without bruits Lungs: CTA bilaterally CV: Irregularly irregular without murmur or gallop Abd: soft, NT, Positive BS, no hepatomegaly Ext: 1+ pretibial edema bilaterally, distal pulses intact and equal Skin: warm/dry no rash  EKG:  Atrial fibrillation 83 beats per minute, nonspecific ST and T wave abnormality.  ASSESSMENT AND PLAN: 1. Coronary atherosclerosis, native vessel. Patient with history of stent thrombosis. The patient will stay on long-term antiplatelet therapy with clopidogrel in the setting of chronic anticoagulation. He is stable without symptoms of angina.  2.  Hypertension. Blood pressure is controlled on a combination of amlodipine, Benicar, carvedilol, furosemide.  3. Atrial fibrillation, chronic. Continue anticoagulation. No bleeding problems reported.  4. Osteoarthritis left knee. He is going to have this evaluated and is considering knee replacement. Unfortunately I think his cardiac risk would be significant. The main issue relates to need to discontinue antiplatelet and anticoagulant drugs for surgery with his history of stent thrombosis. If this were absolutely necessary for his quality of life, we could come up with a plan to manage these medications perioperatively.  He will let me know if he is going to pursue surgery.  [name] 08/30/2013 10:01 AM

## 2013-08-28 NOTE — Patient Instructions (Signed)
Your physician wants you to follow-up in: 6 MONTHS with Dr Burt Knack.  You will receive a reminder letter in the mail two months in advance. If you don't receive a letter, please call our office to schedule the follow-up appointment.  Your physician recommends that you continue on your current medications as directed. Please refer to the Current Medication list given to you today.

## 2013-08-29 ENCOUNTER — Other Ambulatory Visit: Payer: Self-pay | Admitting: Cardiovascular Disease

## 2013-08-30 ENCOUNTER — Encounter (HOSPITAL_COMMUNITY)
Admission: RE | Admit: 2013-08-30 | Discharge: 2013-08-30 | Disposition: A | Discharge status: Home / Self Care | Payer: Self-pay | Source: Ambulatory Visit | Attending: Cardiovascular Disease | Admitting: Cardiovascular Disease

## 2013-09-02 ENCOUNTER — Encounter (HOSPITAL_COMMUNITY)
Admission: RE | Admit: 2013-09-02 | Discharge: 2013-09-02 | Disposition: A | Discharge status: Home / Self Care | Payer: Self-pay | Source: Ambulatory Visit | Attending: Cardiovascular Disease | Admitting: Cardiovascular Disease

## 2013-09-04 ENCOUNTER — Encounter (HOSPITAL_COMMUNITY)
Admission: RE | Admit: 2013-09-04 | Discharge: 2013-09-04 | Disposition: A | Discharge status: Home / Self Care | Payer: Self-pay | Source: Ambulatory Visit | Attending: Cardiovascular Disease | Admitting: Cardiovascular Disease

## 2013-09-06 ENCOUNTER — Encounter (HOSPITAL_COMMUNITY): Payer: Medicare Other

## 2013-09-09 ENCOUNTER — Encounter (HOSPITAL_COMMUNITY): Payer: Self-pay

## 2013-09-10 ENCOUNTER — Other Ambulatory Visit: Payer: Self-pay | Admitting: Cardiovascular Disease

## 2013-09-11 ENCOUNTER — Encounter (HOSPITAL_COMMUNITY)
Admission: RE | Admit: 2013-09-11 | Discharge: 2013-09-11 | Disposition: A | Discharge status: Home / Self Care | Payer: Self-pay | Source: Ambulatory Visit | Attending: Cardiovascular Disease | Admitting: Cardiovascular Disease

## 2013-09-11 DIAGNOSIS — I2510 Atherosclerotic heart disease of native coronary artery without angina pectoris: Secondary | ICD-10-CM | POA: Insufficient documentation

## 2013-09-11 DIAGNOSIS — E785 Hyperlipidemia, unspecified: Secondary | ICD-10-CM | POA: Insufficient documentation

## 2013-09-11 DIAGNOSIS — Z9861 Coronary angioplasty status: Secondary | ICD-10-CM | POA: Insufficient documentation

## 2013-09-11 DIAGNOSIS — E039 Hypothyroidism, unspecified: Secondary | ICD-10-CM | POA: Insufficient documentation

## 2013-09-11 DIAGNOSIS — E669 Obesity, unspecified: Secondary | ICD-10-CM | POA: Insufficient documentation

## 2013-09-11 DIAGNOSIS — E119 Type 2 diabetes mellitus without complications: Secondary | ICD-10-CM | POA: Insufficient documentation

## 2013-09-11 DIAGNOSIS — I10 Essential (primary) hypertension: Secondary | ICD-10-CM | POA: Insufficient documentation

## 2013-09-11 DIAGNOSIS — Z5189 Encounter for other specified aftercare: Secondary | ICD-10-CM | POA: Insufficient documentation

## 2013-09-11 DIAGNOSIS — I252 Old myocardial infarction: Secondary | ICD-10-CM | POA: Insufficient documentation

## 2013-09-13 ENCOUNTER — Encounter (HOSPITAL_COMMUNITY)
Admission: RE | Admit: 2013-09-13 | Discharge: 2013-09-13 | Disposition: A | Discharge status: Home / Self Care | Payer: Self-pay | Source: Ambulatory Visit | Attending: Cardiovascular Disease | Admitting: Cardiovascular Disease

## 2013-09-16 ENCOUNTER — Encounter (HOSPITAL_COMMUNITY)
Admission: RE | Admit: 2013-09-16 | Discharge: 2013-09-16 | Disposition: A | Discharge status: Home / Self Care | Payer: Self-pay | Source: Ambulatory Visit | Attending: Cardiovascular Disease | Admitting: Cardiovascular Disease

## 2013-09-18 ENCOUNTER — Encounter (HOSPITAL_COMMUNITY)
Admission: RE | Admit: 2013-09-18 | Discharge: 2013-09-18 | Disposition: A | Discharge status: Home / Self Care | Payer: Self-pay | Source: Ambulatory Visit | Attending: Cardiovascular Disease | Admitting: Cardiovascular Disease

## 2013-09-20 ENCOUNTER — Encounter (HOSPITAL_COMMUNITY)
Admission: RE | Admit: 2013-09-20 | Discharge: 2013-09-20 | Disposition: A | Discharge status: Home / Self Care | Payer: Self-pay | Source: Ambulatory Visit | Attending: Cardiovascular Disease | Admitting: Cardiovascular Disease

## 2013-09-25 ENCOUNTER — Encounter: Payer: Self-pay | Admitting: Gastroenterology

## 2013-09-27 ENCOUNTER — Other Ambulatory Visit: Payer: Self-pay | Admitting: Gastroenterology

## 2013-09-27 ENCOUNTER — Encounter (HOSPITAL_COMMUNITY)
Admission: RE | Admit: 2013-09-27 | Discharge: 2013-09-27 | Disposition: A | Discharge status: Home / Self Care | Payer: Self-pay | Source: Ambulatory Visit | Attending: Cardiovascular Disease | Admitting: Cardiovascular Disease

## 2013-09-27 ENCOUNTER — Other Ambulatory Visit: Payer: Self-pay | Admitting: Cardiovascular Disease

## 2013-09-30 ENCOUNTER — Encounter (HOSPITAL_COMMUNITY)
Admission: RE | Admit: 2013-09-30 | Discharge: 2013-09-30 | Disposition: A | Discharge status: Home / Self Care | Payer: Self-pay | Source: Ambulatory Visit | Attending: Cardiovascular Disease | Admitting: Cardiovascular Disease

## 2013-10-01 ENCOUNTER — Ambulatory Visit: Payer: Medicare Other | Admitting: Gastroenterology

## 2013-10-02 ENCOUNTER — Encounter (HOSPITAL_COMMUNITY)
Admission: RE | Admit: 2013-10-02 | Discharge: 2013-10-02 | Disposition: A | Discharge status: Home / Self Care | Payer: Self-pay | Source: Ambulatory Visit | Attending: Cardiovascular Disease | Admitting: Cardiovascular Disease

## 2013-10-04 ENCOUNTER — Encounter (HOSPITAL_COMMUNITY)
Admission: RE | Admit: 2013-10-04 | Discharge: 2013-10-04 | Disposition: A | Discharge status: Home / Self Care | Payer: Self-pay | Source: Ambulatory Visit | Attending: Cardiovascular Disease | Admitting: Cardiovascular Disease

## 2013-10-07 ENCOUNTER — Encounter (HOSPITAL_COMMUNITY)
Admission: RE | Admit: 2013-10-07 | Discharge: 2013-10-07 | Disposition: A | Discharge status: Home / Self Care | Payer: Self-pay | Source: Ambulatory Visit | Attending: Cardiovascular Disease | Admitting: Cardiovascular Disease

## 2013-10-07 ENCOUNTER — Ambulatory Visit (INDEPENDENT_AMBULATORY_CARE_PROVIDER_SITE_OTHER): Payer: Medicare Other | Admitting: Internal Medicine

## 2013-10-07 ENCOUNTER — Encounter: Payer: Self-pay | Admitting: Internal Medicine

## 2013-10-07 ENCOUNTER — Telehealth: Payer: Self-pay | Admitting: *Deleted

## 2013-10-07 VITALS — BP 130/80 | HR 77 | Temp 97.5°F | Ht 69.0 in | Wt 299.0 lb

## 2013-10-07 DIAGNOSIS — E119 Type 2 diabetes mellitus without complications: Secondary | ICD-10-CM | POA: Diagnosis not present

## 2013-10-07 NOTE — Patient Instructions (Signed)
Please return in 3 months.  Please continue current regimen.

## 2013-10-07 NOTE — Telephone Encounter (Signed)
Pt is requesting lab results.

## 2013-10-07 NOTE — Telephone Encounter (Signed)
HbA1c 7.8%, down from 10.5%! Excellent!

## 2013-10-07 NOTE — Progress Notes (Signed)
Patient ID: [name], male   DOB: March 29, 1946, 67 y.o.   MRN: [id number]  HPI: [name] is a 67 y.o.-year-old male, returning for DM2, dx 2004, non-insulin-dependent, uncontrolled, with complications (CAD, s/p MI). Last visit 3 mo ago.  Last hemoglobin A1c was: Lab Results  Component Value Date   HGBA1C 10.5* 07/08/2013   HGBA1C 12.4* 04/02/2013   HGBA1C 7.9* 11/01/2011  He started to change his eating habits since the measurement in 03/2013.   Pt is on a regimen of: - Glipizide 5 mg bid  - Metformin 1000 mg bid (increased from 500 mg daily at last visit)  - Januvia 100 mg daily  He does not miss doses.  Pt checks his sugars 2-3 - sugars further improved (no log today): - am: 70-300 >> 157-169 >> 108-112 (around the Holidays 150-160 ocasionally) - 2h after breakfast: 169-193 >> n/c - before lunch 144 >> 110-120s (higher lately during Christmas) - before dinner: 124-162 >> 110-120s - 2h after dinner: 142 >> n/c  No lows.; he has hypoglycemia awareness at mid60s. Highest sugar was 197 x 2 - Thanksgiving.  Pt's meals improved - cut back on red meat, less fried foods. Did not lose weight. He goes to [employer] place - 3x a week (MWF) - 1h/session He is seeing the [facility]ist [facility].  - no CKD, last BUN/creatinine:  Lab Results  Component Value Date   BUN 12 04/02/2013   CREATININE 0.8 04/02/2013  he is on Benicar 40.  - last set of lipids: Lab Results  Component Value Date   CHOL 111 04/02/2013   HDL 72.30 04/02/2013   LDLCALC 5 11/01/2011   LDLDIRECT 12.8 04/02/2013   TRIG 230.0* 04/02/2013   CHOLHDL 2 04/02/2013  He is on omega 3 fatty acids.  - last eye exam was in 12/2012. No DR.  - no numbness and tingling in his feet.  He also has a history of hypothyroidism, hyperlipidemia, OSA-on CPAP, very compliant, A. Fib-on Plavix and pradaxa, IBD, GERD, vitamin B12 deficiency - on im B12, obesity.  ROS: Constitutional: no weight gain/loss, no  fatigue, no subjective hyperthermia/hypothermia;  Eyes: no blurry vision, no xerophthalmia ENT: no sore throat, no nodules palpated in throat, no dysphagia/odynophagia, no hoarseness;  Cardiovascular: no CP/SOB/palpitations/+ leg swelling Respiratory: no cough/SOB Gastrointestinal: no N/V/D/C Musculoskeletal: no muscle/joint aches Skin: no rashes;   I reviewed pt's medications, allergies, PMH, social hx, family hx and no changes required, except as mentioned above.  PE: BP 130/80  Pulse 77  Temp(Src) 97.5 F (36.4 C) (Oral)  Ht 5' 9"  (1.753 m)  Wt 299 lb (135.626 kg)  BMI 44.13 kg/m2  SpO2 95% Wt Readings from Last 3 Encounters:  10/07/13 299 lb (135.626 kg)  08/28/13 296 lb (134.265 kg)  07/08/13 297 lb (134.718 kg)   Constitutional: obese, protuberant abdomen, in NAD Eyes: PERRLA, EOMI, no exophthalmos ENT: moist mucous membranes, no thyromegaly, no cervical lymphadenopathy Cardiovascular: irregularly irregular, regular rate, No MRG; + bilateral leg swelling-wears compression hoses Respiratory: CTA B Gastrointestinal: abdomen soft, NT, ND, BS+ Musculoskeletal: no deformities, strength intact in all 4 Skin: moist, warm, no rashes  ASSESSMENT: 1. DM2, non-insulin-dependent, uncontrolled, with complications - CAD, s/p MI 10/2006 - s/p stent - sees [name]  PLAN:  1. Patient with long-standing DM2, on oral meds; with much better control in last 6 months, after increasing his Metformin and glipizide. Sugars are almost all at goal. - I suggested to:  Continue  Metformin to 1000 mg 2x a day. Continue Januvia 100 mg daily. Continue Glipizide 5 mg 2x daily. - given more sugar logs - had flu vaccine this fall - will check HbA1c today - Return to clinic in 3 mon with sugar log   Office Visit on 10/07/2013  Component Date Value Range Status  . Hemoglobin A1C 10/07/2013 7.8* 4.6 - 6.5 % Final   Glycemic Control Guidelines for People with Diabetes:Non Diabetic:  <6%Goal of  Therapy: <7%Additional Action Suggested:  >8%    Excellent! Pt informed.

## 2013-10-07 NOTE — Telephone Encounter (Signed)
Results given to patient

## 2013-10-09 ENCOUNTER — Encounter (HOSPITAL_COMMUNITY)
Admission: RE | Admit: 2013-10-09 | Discharge: 2013-10-09 | Disposition: A | Discharge status: Home / Self Care | Payer: Self-pay | Source: Ambulatory Visit | Attending: Cardiovascular Disease | Admitting: Cardiovascular Disease

## 2013-10-11 ENCOUNTER — Encounter (HOSPITAL_COMMUNITY)
Admission: RE | Admit: 2013-10-11 | Discharge: 2013-10-11 | Disposition: A | Discharge status: Home / Self Care | Payer: Self-pay | Source: Ambulatory Visit | Attending: Cardiovascular Disease | Admitting: Cardiovascular Disease

## 2013-10-11 ENCOUNTER — Encounter: Payer: Self-pay | Admitting: *Deleted

## 2013-10-11 DIAGNOSIS — Z9861 Coronary angioplasty status: Secondary | ICD-10-CM | POA: Insufficient documentation

## 2013-10-11 DIAGNOSIS — I252 Old myocardial infarction: Secondary | ICD-10-CM | POA: Insufficient documentation

## 2013-10-11 DIAGNOSIS — E119 Type 2 diabetes mellitus without complications: Secondary | ICD-10-CM | POA: Insufficient documentation

## 2013-10-11 DIAGNOSIS — E669 Obesity, unspecified: Secondary | ICD-10-CM | POA: Insufficient documentation

## 2013-10-11 DIAGNOSIS — I2510 Atherosclerotic heart disease of native coronary artery without angina pectoris: Secondary | ICD-10-CM | POA: Insufficient documentation

## 2013-10-11 DIAGNOSIS — E039 Hypothyroidism, unspecified: Secondary | ICD-10-CM | POA: Insufficient documentation

## 2013-10-11 DIAGNOSIS — Z5189 Encounter for other specified aftercare: Secondary | ICD-10-CM | POA: Insufficient documentation

## 2013-10-11 DIAGNOSIS — E785 Hyperlipidemia, unspecified: Secondary | ICD-10-CM | POA: Insufficient documentation

## 2013-10-11 DIAGNOSIS — I10 Essential (primary) hypertension: Secondary | ICD-10-CM | POA: Insufficient documentation

## 2013-10-14 ENCOUNTER — Encounter (HOSPITAL_COMMUNITY)
Admission: RE | Admit: 2013-10-14 | Discharge: 2013-10-14 | Disposition: A | Discharge status: Home / Self Care | Payer: Self-pay | Source: Ambulatory Visit | Attending: Cardiovascular Disease | Admitting: Cardiovascular Disease

## 2013-10-14 ENCOUNTER — Other Ambulatory Visit: Payer: Self-pay | Admitting: *Deleted

## 2013-10-14 MED ORDER — GLIPIZIDE 5 MG PO TABS: 5.0000 mg | ORAL_TABLET | Freq: Two times a day (BID) | ORAL | Status: DC

## 2013-10-15 ENCOUNTER — Ambulatory Visit: Payer: Medicare Other | Admitting: Gastroenterology

## 2013-10-16 ENCOUNTER — Encounter (HOSPITAL_COMMUNITY)
Admission: RE | Admit: 2013-10-16 | Discharge: 2013-10-16 | Disposition: A | Discharge status: Home / Self Care | Payer: Self-pay | Source: Ambulatory Visit | Attending: Cardiovascular Disease | Admitting: Cardiovascular Disease

## 2013-10-18 ENCOUNTER — Encounter (HOSPITAL_COMMUNITY): Payer: Self-pay

## 2013-10-18 ENCOUNTER — Encounter: Payer: Self-pay | Admitting: Gastroenterology

## 2013-10-18 ENCOUNTER — Ambulatory Visit (INDEPENDENT_AMBULATORY_CARE_PROVIDER_SITE_OTHER): Payer: Medicare Other | Admitting: Gastroenterology

## 2013-10-18 VITALS — BP 120/80 | HR 88 | Ht 69.0 in | Wt 300.8 lb

## 2013-10-18 DIAGNOSIS — K5190 Ulcerative colitis, unspecified, without complications: Secondary | ICD-10-CM | POA: Diagnosis not present

## 2013-10-18 MED ORDER — MESALAMINE ER 0.375 G PO CP24: ORAL_CAPSULE | ORAL | Status: DC

## 2013-10-18 NOTE — Patient Instructions (Signed)
Please follow up in one year with Dr. Fuller Plan   Refill of Dorthy Cooler was sent to your pharmacy

## 2013-10-18 NOTE — Progress Notes (Signed)
This is a 68 year old Caucasian male who has had ulcerative colitis for over 30 years.  He is up-to-date on his dysplasia screening colonoscopies.  He is been in remission for several years on the previous 4 capsules a day, and is currently having no abdominal pain, diarrhea, rectal bleeding.  He has multiple cardiovascular issues and is on multiple drugs for hypertension, hypercholesterolemia, and he is on by mouth Plavix.  Other problems include adult onset diabetes but no insulin dependence.  He has known mild hepatomegaly the liver function tests are been normal.  Family history is noncontributory.  Current Medications, Allergies, Past Medical History, Past Surgical History, Family History and Social History were reviewed in [facility] record.  ROS: All systems were reviewed and are negative unless otherwise stated in the HPI.          Physical Exam: Blood pressure 120/80 pulse 88 and weight 300 pounds with a BMI of 44.40.  He is chronically obese patient in no acute distress.  I cannot appreciate stigmata chronic liver disease.  He has a very protuberant abdomen with what appeared to be hepatomegaly in the right upper quadrant with a firm liver edge which is nonnodular and nontender.  There is no splenomegaly, or evidence of ascites.  There no masses or tenderness.  Bowel sounds are normal.  Mental status is normal    Assessment and Plan: Ulcerative colitis greater than 20 years in remission on oral amino salicylates.  He is due for followup colonoscopy next year with Dr. Fuller Plan.  Obviously for this procedure he will need adjustments in his anticoagulants, preoperative visit, and possibly need to be done in hospital depending on his BMI.  Review of his labs shows normal liver function test and CBC.

## 2013-10-21 ENCOUNTER — Encounter (HOSPITAL_COMMUNITY)
Admission: RE | Admit: 2013-10-21 | Discharge: 2013-10-21 | Disposition: A | Discharge status: Home / Self Care | Payer: Self-pay | Source: Ambulatory Visit | Attending: Cardiovascular Disease | Admitting: Cardiovascular Disease

## 2013-10-23 ENCOUNTER — Encounter (HOSPITAL_COMMUNITY)
Admission: RE | Admit: 2013-10-23 | Discharge: 2013-10-23 | Disposition: A | Discharge status: Home / Self Care | Payer: Self-pay | Source: Ambulatory Visit | Attending: Cardiovascular Disease | Admitting: Cardiovascular Disease

## 2013-10-23 ENCOUNTER — Other Ambulatory Visit: Payer: Self-pay | Admitting: Orthopedic Surgery

## 2013-10-25 ENCOUNTER — Encounter (HOSPITAL_COMMUNITY): Payer: Self-pay

## 2013-10-27 ENCOUNTER — Other Ambulatory Visit: Payer: Self-pay | Admitting: Internal Medicine

## 2013-10-28 ENCOUNTER — Encounter (HOSPITAL_COMMUNITY): Payer: Self-pay

## 2013-10-30 ENCOUNTER — Encounter (HOSPITAL_COMMUNITY)
Admission: RE | Admit: 2013-10-30 | Discharge: 2013-10-30 | Disposition: A | Discharge status: Home / Self Care | Payer: Self-pay | Source: Ambulatory Visit | Attending: Cardiovascular Disease | Admitting: Cardiovascular Disease

## 2013-11-01 ENCOUNTER — Encounter (HOSPITAL_COMMUNITY)
Admission: RE | Admit: 2013-11-01 | Discharge: 2013-11-01 | Disposition: A | Discharge status: Home / Self Care | Payer: Self-pay | Source: Ambulatory Visit | Attending: Cardiovascular Disease | Admitting: Cardiovascular Disease

## 2013-11-04 ENCOUNTER — Encounter (HOSPITAL_COMMUNITY)
Admission: RE | Admit: 2013-11-04 | Discharge: 2013-11-04 | Disposition: A | Discharge status: Home / Self Care | Payer: Self-pay | Source: Ambulatory Visit | Attending: Cardiovascular Disease | Admitting: Cardiovascular Disease

## 2013-11-06 ENCOUNTER — Encounter (HOSPITAL_COMMUNITY): Payer: Self-pay

## 2013-11-08 ENCOUNTER — Encounter (HOSPITAL_COMMUNITY)
Admission: RE | Admit: 2013-11-08 | Discharge: 2013-11-08 | Disposition: A | Discharge status: Home / Self Care | Payer: Self-pay | Source: Ambulatory Visit | Attending: Cardiovascular Disease | Admitting: Cardiovascular Disease

## 2013-11-11 ENCOUNTER — Other Ambulatory Visit: Payer: Self-pay | Admitting: Internal Medicine

## 2013-11-11 ENCOUNTER — Encounter (HOSPITAL_COMMUNITY)
Admission: RE | Admit: 2013-11-11 | Discharge: 2013-11-11 | Disposition: A | Discharge status: Home / Self Care | Payer: Self-pay | Source: Ambulatory Visit | Attending: Cardiovascular Disease | Admitting: Cardiovascular Disease

## 2013-11-11 DIAGNOSIS — Z5189 Encounter for other specified aftercare: Secondary | ICD-10-CM | POA: Insufficient documentation

## 2013-11-11 DIAGNOSIS — E039 Hypothyroidism, unspecified: Secondary | ICD-10-CM | POA: Insufficient documentation

## 2013-11-11 DIAGNOSIS — E119 Type 2 diabetes mellitus without complications: Secondary | ICD-10-CM | POA: Insufficient documentation

## 2013-11-11 DIAGNOSIS — Z9861 Coronary angioplasty status: Secondary | ICD-10-CM | POA: Insufficient documentation

## 2013-11-11 DIAGNOSIS — I10 Essential (primary) hypertension: Secondary | ICD-10-CM | POA: Insufficient documentation

## 2013-11-11 DIAGNOSIS — E785 Hyperlipidemia, unspecified: Secondary | ICD-10-CM | POA: Insufficient documentation

## 2013-11-11 DIAGNOSIS — I252 Old myocardial infarction: Secondary | ICD-10-CM | POA: Insufficient documentation

## 2013-11-11 DIAGNOSIS — I2510 Atherosclerotic heart disease of native coronary artery without angina pectoris: Secondary | ICD-10-CM | POA: Insufficient documentation

## 2013-11-11 DIAGNOSIS — E669 Obesity, unspecified: Secondary | ICD-10-CM | POA: Insufficient documentation

## 2013-11-13 ENCOUNTER — Encounter (HOSPITAL_COMMUNITY)
Admission: RE | Admit: 2013-11-13 | Discharge: 2013-11-13 | Disposition: A | Discharge status: Home / Self Care | Payer: Self-pay | Source: Ambulatory Visit | Attending: Cardiovascular Disease | Admitting: Cardiovascular Disease

## 2013-11-15 ENCOUNTER — Encounter (HOSPITAL_COMMUNITY)
Admission: RE | Admit: 2013-11-15 | Discharge: 2013-11-15 | Disposition: A | Discharge status: Home / Self Care | Payer: Self-pay | Source: Ambulatory Visit | Attending: Cardiovascular Disease | Admitting: Cardiovascular Disease

## 2013-11-18 ENCOUNTER — Encounter (HOSPITAL_COMMUNITY)
Admission: RE | Admit: 2013-11-18 | Discharge: 2013-11-18 | Disposition: A | Discharge status: Home / Self Care | Payer: Self-pay | Source: Ambulatory Visit | Attending: Cardiovascular Disease | Admitting: Cardiovascular Disease

## 2013-11-20 ENCOUNTER — Encounter (HOSPITAL_COMMUNITY)
Admission: RE | Admit: 2013-11-20 | Discharge: 2013-11-20 | Disposition: A | Discharge status: Home / Self Care | Payer: Self-pay | Source: Ambulatory Visit | Attending: Cardiovascular Disease | Admitting: Cardiovascular Disease

## 2013-11-22 ENCOUNTER — Encounter (HOSPITAL_COMMUNITY)
Admission: RE | Admit: 2013-11-22 | Discharge: 2013-11-22 | Disposition: A | Discharge status: Home / Self Care | Payer: Self-pay | Source: Ambulatory Visit | Attending: Cardiovascular Disease | Admitting: Cardiovascular Disease

## 2013-11-25 ENCOUNTER — Encounter (HOSPITAL_COMMUNITY)
Admission: RE | Admit: 2013-11-25 | Discharge: 2013-11-25 | Disposition: A | Discharge status: Home / Self Care | Payer: Self-pay | Source: Ambulatory Visit | Attending: Cardiovascular Disease | Admitting: Cardiovascular Disease

## 2013-11-27 ENCOUNTER — Other Ambulatory Visit: Payer: Self-pay | Admitting: Internal Medicine

## 2013-11-27 ENCOUNTER — Encounter (HOSPITAL_COMMUNITY): Payer: Self-pay

## 2013-11-29 ENCOUNTER — Encounter (HOSPITAL_COMMUNITY): Payer: Self-pay

## 2013-12-02 ENCOUNTER — Encounter (HOSPITAL_COMMUNITY)
Admission: RE | Admit: 2013-12-02 | Discharge: 2013-12-02 | Disposition: A | Discharge status: Home / Self Care | Payer: Self-pay | Source: Ambulatory Visit | Attending: Cardiovascular Disease | Admitting: Cardiovascular Disease

## 2013-12-04 ENCOUNTER — Encounter (HOSPITAL_COMMUNITY)
Admission: RE | Admit: 2013-12-04 | Discharge: 2013-12-04 | Disposition: A | Discharge status: Home / Self Care | Payer: Self-pay | Source: Ambulatory Visit | Attending: Cardiovascular Disease | Admitting: Cardiovascular Disease

## 2013-12-06 ENCOUNTER — Encounter (HOSPITAL_COMMUNITY): Payer: Self-pay

## 2013-12-09 ENCOUNTER — Encounter (HOSPITAL_COMMUNITY)
Admission: RE | Admit: 2013-12-09 | Discharge: 2013-12-09 | Disposition: A | Discharge status: Home / Self Care | Payer: Self-pay | Source: Ambulatory Visit | Attending: Cardiovascular Disease | Admitting: Cardiovascular Disease

## 2013-12-09 DIAGNOSIS — E039 Hypothyroidism, unspecified: Secondary | ICD-10-CM | POA: Insufficient documentation

## 2013-12-09 DIAGNOSIS — Z9861 Coronary angioplasty status: Secondary | ICD-10-CM | POA: Insufficient documentation

## 2013-12-09 DIAGNOSIS — I2510 Atherosclerotic heart disease of native coronary artery without angina pectoris: Secondary | ICD-10-CM | POA: Insufficient documentation

## 2013-12-09 DIAGNOSIS — E669 Obesity, unspecified: Secondary | ICD-10-CM | POA: Insufficient documentation

## 2013-12-09 DIAGNOSIS — I252 Old myocardial infarction: Secondary | ICD-10-CM | POA: Insufficient documentation

## 2013-12-09 DIAGNOSIS — E785 Hyperlipidemia, unspecified: Secondary | ICD-10-CM | POA: Insufficient documentation

## 2013-12-09 DIAGNOSIS — I10 Essential (primary) hypertension: Secondary | ICD-10-CM | POA: Insufficient documentation

## 2013-12-09 DIAGNOSIS — E119 Type 2 diabetes mellitus without complications: Secondary | ICD-10-CM | POA: Insufficient documentation

## 2013-12-09 DIAGNOSIS — Z5189 Encounter for other specified aftercare: Secondary | ICD-10-CM | POA: Insufficient documentation

## 2013-12-11 ENCOUNTER — Encounter (HOSPITAL_COMMUNITY)
Admission: RE | Admit: 2013-12-11 | Discharge: 2013-12-11 | Disposition: A | Discharge status: Home / Self Care | Payer: Self-pay | Source: Ambulatory Visit | Attending: Cardiovascular Disease | Admitting: Cardiovascular Disease

## 2013-12-13 ENCOUNTER — Encounter (HOSPITAL_COMMUNITY): Payer: Self-pay

## 2013-12-16 ENCOUNTER — Encounter (HOSPITAL_COMMUNITY)
Admission: RE | Admit: 2013-12-16 | Discharge: 2013-12-16 | Disposition: A | Discharge status: Home / Self Care | Payer: Self-pay | Source: Ambulatory Visit | Attending: Cardiovascular Disease | Admitting: Cardiovascular Disease

## 2013-12-18 ENCOUNTER — Encounter (HOSPITAL_COMMUNITY)
Admission: RE | Admit: 2013-12-18 | Discharge: 2013-12-18 | Disposition: A | Discharge status: Home / Self Care | Payer: Self-pay | Source: Ambulatory Visit | Attending: Cardiovascular Disease | Admitting: Cardiovascular Disease

## 2013-12-20 ENCOUNTER — Encounter (HOSPITAL_COMMUNITY)
Admission: RE | Admit: 2013-12-20 | Discharge: 2013-12-20 | Disposition: A | Discharge status: Home / Self Care | Payer: Self-pay | Source: Ambulatory Visit | Attending: Cardiovascular Disease | Admitting: Cardiovascular Disease

## 2013-12-23 ENCOUNTER — Encounter (HOSPITAL_COMMUNITY)
Admission: RE | Admit: 2013-12-23 | Discharge: 2013-12-23 | Disposition: A | Discharge status: Home / Self Care | Payer: Self-pay | Source: Ambulatory Visit | Attending: Cardiovascular Disease | Admitting: Cardiovascular Disease

## 2013-12-25 ENCOUNTER — Other Ambulatory Visit: Payer: Self-pay | Admitting: Cardiovascular Disease

## 2013-12-25 ENCOUNTER — Encounter (HOSPITAL_COMMUNITY)
Admission: RE | Admit: 2013-12-25 | Discharge: 2013-12-25 | Disposition: A | Discharge status: Home / Self Care | Payer: Self-pay | Source: Ambulatory Visit | Attending: Cardiovascular Disease | Admitting: Cardiovascular Disease

## 2013-12-27 ENCOUNTER — Encounter (HOSPITAL_COMMUNITY)
Admission: RE | Admit: 2013-12-27 | Discharge: 2013-12-27 | Disposition: A | Discharge status: Home / Self Care | Payer: Self-pay | Source: Ambulatory Visit | Attending: Cardiovascular Disease | Admitting: Cardiovascular Disease

## 2013-12-30 ENCOUNTER — Encounter (HOSPITAL_COMMUNITY)
Admission: RE | Admit: 2013-12-30 | Discharge: 2013-12-30 | Disposition: A | Discharge status: Home / Self Care | Payer: Self-pay | Source: Ambulatory Visit | Attending: Cardiovascular Disease | Admitting: Cardiovascular Disease

## 2014-01-01 ENCOUNTER — Encounter (HOSPITAL_COMMUNITY)
Admission: RE | Admit: 2014-01-01 | Discharge: 2014-01-01 | Disposition: A | Discharge status: Home / Self Care | Payer: Self-pay | Source: Ambulatory Visit | Attending: Cardiovascular Disease | Admitting: Cardiovascular Disease

## 2014-01-03 ENCOUNTER — Encounter (HOSPITAL_COMMUNITY)
Admission: RE | Admit: 2014-01-03 | Discharge: 2014-01-03 | Disposition: A | Discharge status: Home / Self Care | Payer: Self-pay | Source: Ambulatory Visit | Attending: Cardiovascular Disease | Admitting: Cardiovascular Disease

## 2014-01-06 ENCOUNTER — Encounter: Payer: Self-pay | Admitting: Internal Medicine

## 2014-01-06 ENCOUNTER — Ambulatory Visit (INDEPENDENT_AMBULATORY_CARE_PROVIDER_SITE_OTHER): Payer: Medicare Other | Admitting: Internal Medicine

## 2014-01-06 ENCOUNTER — Encounter (HOSPITAL_COMMUNITY)
Admission: RE | Admit: 2014-01-06 | Discharge: 2014-01-06 | Disposition: A | Discharge status: Home / Self Care | Payer: Self-pay | Source: Ambulatory Visit | Attending: Cardiovascular Disease | Admitting: Cardiovascular Disease

## 2014-01-06 VITALS — BP 122/68 | HR 83 | Temp 98.1°F | Resp 16 | Wt 301.0 lb

## 2014-01-06 DIAGNOSIS — E1059 Type 1 diabetes mellitus with other circulatory complications: Secondary | ICD-10-CM | POA: Diagnosis not present

## 2014-01-06 LAB — HEMOGLOBIN A1C: Hgb A1c MFr Bld: 8.1 % — ABNORMAL HIGH (ref 4.6–6.5)

## 2014-01-06 MED ORDER — GLIPIZIDE 10 MG PO TABS: 10.0000 mg | ORAL_TABLET | Freq: Two times a day (BID) | ORAL | Status: DC

## 2014-01-06 NOTE — Patient Instructions (Signed)
Continue current regimen, except increase Glipizide to 10 mg 2x a day. Please return in 3 months with your sugar log.

## 2014-01-06 NOTE — Progress Notes (Signed)
Patient ID: [name], male   DOB: 1946/02/26, 68 y.o.   MRN: [id number]  HPI: [name] is a 68 y.o.-year-old male, returning for DM2, dx 2004, non-insulin-dependent, uncontrolled, with complications (CAD, s/p MI). Last visit 3 mo ago.  Last hemoglobin A1c was: Lab Results  Component Value Date   HGBA1C 7.8* 10/07/2013   HGBA1C 10.5* 07/08/2013   HGBA1C 12.4* 04/02/2013  He started to change his eating habits since the measurement in 03/2013.   Pt is on a regimen of: - Glipizide 5 mg bid  - Metformin 1000 mg bid  - Januvia 100 mg daily  He does not miss doses.  Pt checks his sugars 2-3 - sugars worsened lately (we reviewed log): - am: 70-300 >> 157-169 >> 108-112 (around the Holidays 150-160 ocasionally) >> 105-136 - 2h after breakfast: 169-193 >> n/c >> 163-281 - before lunch 144 >> 110-120s >> n/c - before dinner: 124-162 >> 110-120s >> 85-172  - 2h after dinner: 142 >> n/c >> 150-306 (200s mostly) No lows.; he has hypoglycemia awareness at mid60s.   He and his wife eat out a lot >> most dinners now.   Pt's meals improved - cut back on red meat, less fried foods. He goes to [employer] place - 3x a week (MWF) - 1h/session He is seeing the nutritionist at the [facility].  - no CKD, last BUN/creatinine:  Lab Results  Component Value Date   BUN 12 04/02/2013   CREATININE 0.8 04/02/2013  he is on Benicar 40.  - last set of lipids: Lab Results  Component Value Date   CHOL 111 04/02/2013   HDL 72.30 04/02/2013   LDLCALC 5 11/01/2011   LDLDIRECT 12.8 04/02/2013   TRIG 230.0* 04/02/2013   CHOLHDL 2 04/02/2013  He is on omega 3 fatty acids.  - last eye exam was in 12/2012. No DR. Has another one (Dr [name]) in 02/2014. - no numbness and tingling in his feet.  He also has a history of hypothyroidism, hyperlipidemia, OSA-on CPAP, very compliant, A. Fib-on Plavix and pradaxa, IBD, GERD, vitamin B12 deficiency - on im B12, obesity.  ROS: Constitutional: no  weight gain/loss, no fatigue, no subjective hyperthermia/hypothermia;  Eyes: no blurry vision, no xerophthalmia ENT: no sore throat, no nodules palpated in throat, no dysphagia/odynophagia, no hoarseness;  Cardiovascular: no CP/SOB/palpitations/+ leg swelling Respiratory: no cough/SOB Gastrointestinal: no N/V/D/C Musculoskeletal: no muscle/joint aches Skin: no rashes;   I reviewed pt's medications, allergies, PMH, social hx, family hx and no changes required, except as mentioned above.  PE: BP 122/68  Pulse 83  Temp(Src) 98.1 F (36.7 C) (Oral)  Resp 16  Wt 301 lb (136.533 kg)  SpO2 96% Wt Readings from Last 3 Encounters:  01/06/14 301 lb (136.533 kg)  10/18/13 300 lb 12.8 oz (136.442 kg)  10/07/13 299 lb (135.626 kg)   Constitutional: obese, protuberant abdomen, in NAD Eyes: PERRLA, EOMI, no exophthalmos ENT: moist mucous membranes, no thyromegaly, no cervical lymphadenopathy Cardiovascular: irregularly irregular, regular rate, No MRG; + bilateral leg swelling-wears compression hoses Respiratory: CTA B Gastrointestinal: abdomen soft, NT, ND, BS+ Musculoskeletal: no deformities, strength intact in all 4 Skin: moist, warm, no rashes  ASSESSMENT: 1. DM2, non-insulin-dependent, uncontrolled, with complications - CAD, s/p MI 10/2006 - s/p stent - sees [name]  PLAN:  1. Patient with long-standing DM2, on oral meds; with worsened post meal sugars lately - I suggested to:  Continue Metformin to 1000 mg 2x a day. Continue Januvia 100  mg daily. Continue Glipizide 2x daily, but increase to 10 mg bid. - given more sugar logs - sent Rx for Glipizide to pharmacy. - had flu vaccine this fall - will check HbA1c today - Return to clinic in 3 mo with sugar log   Office Visit on 01/06/2014  Component Date Value Ref Range Status  . Hemoglobin A1C 01/06/2014 8.1* 4.6 - 6.5 % Final   Glycemic Control Guidelines for People with Diabetes:Non Diabetic:  <6%Goal of Therapy:  <7%Additional Action Suggested:  >8%    HbA1c increased >> see plan above.

## 2014-01-07 ENCOUNTER — Telehealth: Payer: Self-pay | Admitting: Internal Medicine

## 2014-01-08 ENCOUNTER — Encounter (HOSPITAL_COMMUNITY)
Admission: RE | Admit: 2014-01-08 | Discharge: 2014-01-08 | Disposition: A | Discharge status: Home / Self Care | Payer: Self-pay | Source: Ambulatory Visit | Attending: Cardiovascular Disease | Admitting: Cardiovascular Disease

## 2014-01-08 DIAGNOSIS — Z5189 Encounter for other specified aftercare: Secondary | ICD-10-CM | POA: Insufficient documentation

## 2014-01-08 DIAGNOSIS — E039 Hypothyroidism, unspecified: Secondary | ICD-10-CM | POA: Insufficient documentation

## 2014-01-08 DIAGNOSIS — I10 Essential (primary) hypertension: Secondary | ICD-10-CM | POA: Insufficient documentation

## 2014-01-08 DIAGNOSIS — I2510 Atherosclerotic heart disease of native coronary artery without angina pectoris: Secondary | ICD-10-CM | POA: Insufficient documentation

## 2014-01-08 DIAGNOSIS — E785 Hyperlipidemia, unspecified: Secondary | ICD-10-CM | POA: Insufficient documentation

## 2014-01-08 DIAGNOSIS — I252 Old myocardial infarction: Secondary | ICD-10-CM | POA: Insufficient documentation

## 2014-01-08 DIAGNOSIS — Z9861 Coronary angioplasty status: Secondary | ICD-10-CM | POA: Insufficient documentation

## 2014-01-08 DIAGNOSIS — E669 Obesity, unspecified: Secondary | ICD-10-CM | POA: Insufficient documentation

## 2014-01-08 DIAGNOSIS — E119 Type 2 diabetes mellitus without complications: Secondary | ICD-10-CM | POA: Insufficient documentation

## 2014-01-10 ENCOUNTER — Encounter (HOSPITAL_COMMUNITY)
Admission: RE | Admit: 2014-01-10 | Discharge: 2014-01-10 | Disposition: A | Discharge status: Home / Self Care | Payer: Self-pay | Source: Ambulatory Visit | Attending: Cardiovascular Disease | Admitting: Cardiovascular Disease

## 2014-01-13 ENCOUNTER — Encounter (HOSPITAL_COMMUNITY)
Admission: RE | Admit: 2014-01-13 | Discharge: 2014-01-13 | Disposition: A | Discharge status: Home / Self Care | Payer: Self-pay | Source: Ambulatory Visit | Attending: Cardiovascular Disease | Admitting: Cardiovascular Disease

## 2014-01-15 ENCOUNTER — Encounter (HOSPITAL_COMMUNITY)
Admission: RE | Admit: 2014-01-15 | Discharge: 2014-01-15 | Disposition: A | Discharge status: Home / Self Care | Payer: Self-pay | Source: Ambulatory Visit | Attending: Cardiovascular Disease | Admitting: Cardiovascular Disease

## 2014-01-17 ENCOUNTER — Encounter (HOSPITAL_COMMUNITY)
Admission: RE | Admit: 2014-01-17 | Discharge: 2014-01-17 | Disposition: A | Discharge status: Home / Self Care | Payer: Self-pay | Source: Ambulatory Visit | Attending: Cardiovascular Disease | Admitting: Cardiovascular Disease

## 2014-01-20 ENCOUNTER — Encounter (HOSPITAL_COMMUNITY): Payer: Self-pay

## 2014-01-22 ENCOUNTER — Encounter (HOSPITAL_COMMUNITY): Payer: Self-pay

## 2014-01-24 ENCOUNTER — Encounter (HOSPITAL_COMMUNITY)
Admission: RE | Admit: 2014-01-24 | Discharge: 2014-01-24 | Disposition: A | Discharge status: Home / Self Care | Payer: Self-pay | Source: Ambulatory Visit | Attending: Cardiovascular Disease | Admitting: Cardiovascular Disease

## 2014-01-25 ENCOUNTER — Other Ambulatory Visit: Payer: Self-pay | Admitting: Cardiovascular Disease

## 2014-01-27 ENCOUNTER — Encounter (HOSPITAL_COMMUNITY)
Admission: RE | Admit: 2014-01-27 | Discharge: 2014-01-27 | Disposition: A | Discharge status: Home / Self Care | Payer: Self-pay | Source: Ambulatory Visit | Attending: Cardiovascular Disease | Admitting: Cardiovascular Disease

## 2014-01-29 ENCOUNTER — Encounter (HOSPITAL_COMMUNITY)
Admission: RE | Admit: 2014-01-29 | Discharge: 2014-01-29 | Disposition: A | Discharge status: Home / Self Care | Payer: Self-pay | Source: Ambulatory Visit | Attending: Cardiovascular Disease | Admitting: Cardiovascular Disease

## 2014-01-31 ENCOUNTER — Encounter (HOSPITAL_COMMUNITY)
Admission: RE | Admit: 2014-01-31 | Discharge: 2014-01-31 | Disposition: A | Discharge status: Home / Self Care | Payer: Self-pay | Source: Ambulatory Visit | Attending: Cardiovascular Disease | Admitting: Cardiovascular Disease

## 2014-02-03 ENCOUNTER — Encounter (HOSPITAL_COMMUNITY)
Admission: RE | Admit: 2014-02-03 | Discharge: 2014-02-03 | Disposition: A | Discharge status: Home / Self Care | Payer: Self-pay | Source: Ambulatory Visit | Attending: Cardiovascular Disease | Admitting: Cardiovascular Disease

## 2014-02-05 ENCOUNTER — Encounter (HOSPITAL_COMMUNITY)
Admission: RE | Admit: 2014-02-05 | Discharge: 2014-02-05 | Disposition: A | Discharge status: Home / Self Care | Payer: Self-pay | Source: Ambulatory Visit | Attending: Cardiovascular Disease | Admitting: Cardiovascular Disease

## 2014-02-07 ENCOUNTER — Encounter (HOSPITAL_COMMUNITY)
Admission: RE | Admit: 2014-02-07 | Discharge: 2014-02-07 | Disposition: A | Discharge status: Home / Self Care | Payer: Self-pay | Source: Ambulatory Visit | Attending: Cardiovascular Disease | Admitting: Cardiovascular Disease

## 2014-02-07 DIAGNOSIS — I10 Essential (primary) hypertension: Secondary | ICD-10-CM | POA: Insufficient documentation

## 2014-02-07 DIAGNOSIS — I252 Old myocardial infarction: Secondary | ICD-10-CM | POA: Insufficient documentation

## 2014-02-07 DIAGNOSIS — E669 Obesity, unspecified: Secondary | ICD-10-CM | POA: Insufficient documentation

## 2014-02-07 DIAGNOSIS — Z5189 Encounter for other specified aftercare: Secondary | ICD-10-CM | POA: Insufficient documentation

## 2014-02-07 DIAGNOSIS — Z9861 Coronary angioplasty status: Secondary | ICD-10-CM | POA: Insufficient documentation

## 2014-02-07 DIAGNOSIS — E119 Type 2 diabetes mellitus without complications: Secondary | ICD-10-CM | POA: Insufficient documentation

## 2014-02-07 DIAGNOSIS — E785 Hyperlipidemia, unspecified: Secondary | ICD-10-CM | POA: Insufficient documentation

## 2014-02-07 DIAGNOSIS — E039 Hypothyroidism, unspecified: Secondary | ICD-10-CM | POA: Insufficient documentation

## 2014-02-07 DIAGNOSIS — I2510 Atherosclerotic heart disease of native coronary artery without angina pectoris: Secondary | ICD-10-CM | POA: Insufficient documentation

## 2014-02-10 ENCOUNTER — Encounter (HOSPITAL_COMMUNITY)
Admission: RE | Admit: 2014-02-10 | Discharge: 2014-02-10 | Disposition: A | Discharge status: Home / Self Care | Payer: Self-pay | Source: Ambulatory Visit | Attending: Cardiovascular Disease | Admitting: Cardiovascular Disease

## 2014-02-12 ENCOUNTER — Encounter (HOSPITAL_COMMUNITY): Payer: Self-pay

## 2014-02-14 ENCOUNTER — Encounter (HOSPITAL_COMMUNITY)
Admission: RE | Admit: 2014-02-14 | Discharge: 2014-02-14 | Disposition: A | Discharge status: Home / Self Care | Payer: Self-pay | Source: Ambulatory Visit | Attending: Cardiovascular Disease | Admitting: Cardiovascular Disease

## 2014-02-17 ENCOUNTER — Encounter (HOSPITAL_COMMUNITY)
Admission: RE | Admit: 2014-02-17 | Discharge: 2014-02-17 | Disposition: A | Discharge status: Home / Self Care | Payer: Self-pay | Source: Ambulatory Visit | Attending: Cardiovascular Disease | Admitting: Cardiovascular Disease

## 2014-02-19 ENCOUNTER — Encounter (HOSPITAL_COMMUNITY)
Admission: RE | Admit: 2014-02-19 | Discharge: 2014-02-19 | Disposition: A | Discharge status: Home / Self Care | Payer: Self-pay | Source: Ambulatory Visit | Attending: Cardiovascular Disease | Admitting: Cardiovascular Disease

## 2014-02-21 ENCOUNTER — Encounter (HOSPITAL_COMMUNITY)
Admission: RE | Admit: 2014-02-21 | Discharge: 2014-02-21 | Disposition: A | Discharge status: Home / Self Care | Payer: Self-pay | Source: Ambulatory Visit | Attending: Cardiovascular Disease | Admitting: Cardiovascular Disease

## 2014-02-24 ENCOUNTER — Encounter (HOSPITAL_COMMUNITY)
Admission: RE | Admit: 2014-02-24 | Discharge: 2014-02-24 | Disposition: A | Discharge status: Home / Self Care | Payer: Self-pay | Source: Ambulatory Visit | Attending: Cardiovascular Disease | Admitting: Cardiovascular Disease

## 2014-03-12 ENCOUNTER — Encounter (HOSPITAL_COMMUNITY)
Admission: RE | Admit: 2014-03-12 | Discharge: 2014-03-12 | Disposition: A | Discharge status: Home / Self Care | Payer: Self-pay | Source: Ambulatory Visit | Attending: Cardiovascular Disease | Admitting: Cardiovascular Disease

## 2014-03-12 DIAGNOSIS — Z9861 Coronary angioplasty status: Secondary | ICD-10-CM | POA: Insufficient documentation

## 2014-03-12 DIAGNOSIS — E119 Type 2 diabetes mellitus without complications: Secondary | ICD-10-CM | POA: Insufficient documentation

## 2014-03-12 DIAGNOSIS — E785 Hyperlipidemia, unspecified: Secondary | ICD-10-CM | POA: Insufficient documentation

## 2014-03-12 DIAGNOSIS — I252 Old myocardial infarction: Secondary | ICD-10-CM | POA: Insufficient documentation

## 2014-03-12 DIAGNOSIS — E039 Hypothyroidism, unspecified: Secondary | ICD-10-CM | POA: Insufficient documentation

## 2014-03-12 DIAGNOSIS — E669 Obesity, unspecified: Secondary | ICD-10-CM | POA: Insufficient documentation

## 2014-03-12 DIAGNOSIS — I2510 Atherosclerotic heart disease of native coronary artery without angina pectoris: Secondary | ICD-10-CM | POA: Insufficient documentation

## 2014-03-12 DIAGNOSIS — Z5189 Encounter for other specified aftercare: Secondary | ICD-10-CM | POA: Insufficient documentation

## 2014-03-12 DIAGNOSIS — I10 Essential (primary) hypertension: Secondary | ICD-10-CM | POA: Insufficient documentation

## 2014-03-14 ENCOUNTER — Encounter (HOSPITAL_COMMUNITY)
Admission: RE | Admit: 2014-03-14 | Discharge: 2014-03-14 | Disposition: A | Discharge status: Home / Self Care | Payer: Self-pay | Source: Ambulatory Visit | Attending: Cardiovascular Disease | Admitting: Cardiovascular Disease

## 2014-03-17 ENCOUNTER — Encounter (HOSPITAL_COMMUNITY)
Admission: RE | Admit: 2014-03-17 | Discharge: 2014-03-17 | Disposition: A | Discharge status: Home / Self Care | Payer: Self-pay | Source: Ambulatory Visit | Attending: Cardiovascular Disease | Admitting: Cardiovascular Disease

## 2014-03-19 ENCOUNTER — Encounter (HOSPITAL_COMMUNITY): Payer: Self-pay

## 2014-03-21 ENCOUNTER — Encounter (HOSPITAL_COMMUNITY)
Admission: RE | Admit: 2014-03-21 | Discharge: 2014-03-21 | Disposition: A | Discharge status: Home / Self Care | Payer: Self-pay | Source: Ambulatory Visit | Attending: Cardiovascular Disease | Admitting: Cardiovascular Disease

## 2014-03-24 ENCOUNTER — Encounter (HOSPITAL_COMMUNITY)
Admission: RE | Admit: 2014-03-24 | Discharge: 2014-03-24 | Disposition: A | Discharge status: Home / Self Care | Payer: Self-pay | Source: Ambulatory Visit | Attending: Cardiovascular Disease | Admitting: Cardiovascular Disease

## 2014-03-24 ENCOUNTER — Other Ambulatory Visit: Payer: Self-pay | Admitting: Cardiovascular Disease

## 2014-03-26 ENCOUNTER — Encounter: Payer: Self-pay | Admitting: Cardiovascular Disease

## 2014-03-26 ENCOUNTER — Encounter (HOSPITAL_COMMUNITY)
Admission: RE | Admit: 2014-03-26 | Discharge: 2014-03-26 | Disposition: A | Discharge status: Home / Self Care | Payer: Self-pay | Source: Ambulatory Visit | Attending: Cardiovascular Disease | Admitting: Cardiovascular Disease

## 2014-03-26 ENCOUNTER — Ambulatory Visit (INDEPENDENT_AMBULATORY_CARE_PROVIDER_SITE_OTHER): Payer: Medicare Other | Admitting: Cardiovascular Disease

## 2014-03-26 VITALS — BP 143/90 | HR 84 | Ht 69.0 in | Wt 299.4 lb

## 2014-03-26 DIAGNOSIS — I2510 Atherosclerotic heart disease of native coronary artery without angina pectoris: Secondary | ICD-10-CM

## 2014-03-26 DIAGNOSIS — I4891 Unspecified atrial fibrillation: Secondary | ICD-10-CM

## 2014-03-26 DIAGNOSIS — I10 Essential (primary) hypertension: Secondary | ICD-10-CM | POA: Diagnosis not present

## 2014-03-26 NOTE — Patient Instructions (Signed)
Your physician wants you to follow-up in: 1 YEAR with Dr Cooper.  You will receive a reminder letter in the mail two months in advance. If you don't receive a letter, please call our office to schedule the follow-up appointment.  Your physician recommends that you continue on your current medications as directed. Please refer to the Current Medication list given to you today.  

## 2014-03-26 NOTE — Progress Notes (Signed)
HPI:  Mr. Hankerson returns for followup evaluation he is a 68 year old gentleman with atrial fibrillation and coronary artery disease. He has failed cardioversion but has tolerated chronic atrial fibrillation without symptoms. He is anticoagulated with pradaxa. The patient also has a history of coronary artery disease and stent thrombosis. He had been maintained on triple therapy , but we did P2Y12 testing in 2013 confirming his responsiveness to Plavix. Aspirin was discontinued at that time so he is maintained on anticoagulation with Pradaxa and antiplatelet therapy with clopidogrel.  The patient is doing well. He denies chest pain, palpitations, lightheadedness, or syncope. He has chronic exertional dyspnea which she reports no significant change. No orthopnea or PND. The patient has mild leg swelling, also unchanged. He's had no bleeding problems and seems to be tolerating the combination of pradaxa and Plavix.   Outpatient Encounter Prescriptions as of 03/26/2014  Medication Sig  . amLODipine (NORVASC) 10 MG tablet TAKE ONE (1) TABLET EACH DAY  . BENICAR 40 MG tablet TAKE ONE (1) TABLET EACH DAY  . carvedilol (COREG) 25 MG tablet TAKE ONE TABLET TWICE DAILY WITH A MEAL  . clopidogrel (PLAVIX) 75 MG tablet TAKE ONE (1) TABLET EACH DAY  . fish oil-omega-3 fatty acids 1000 MG capsule Take 2 g by mouth daily.   . furosemide (LASIX) 40 MG tablet TAKE ONE (1) TABLET EACH DAY  . glipiZIDE (GLUCOTROL) 10 MG tablet Take 1 tablet (10 mg total) by mouth 2 (two) times daily before a meal.  . Glucosamine-Chondroitin (OSTEO BI-FLEX REGULAR STRENGTH) 250-200 MG TABS Take 1 tablet by mouth 2 (two) times daily.   Marland Kitchen JANUVIA 100 MG tablet TAKE ONE (1) TABLET EACH DAY  . mesalamine (APRISO) 0.375 G 24 hr capsule TAKE FOUR CAPSULES BY MOUTH DAILY  . metFORMIN (GLUCOPHAGE) 1000 MG tablet Take 1 tablet (1,000 mg total) by mouth 2 (two) times daily with a meal.  . ONE TOUCH ULTRA TEST test strip 1 each by Other  route daily.   . potassium chloride SA (K-DUR,KLOR-CON) 20 MEQ tablet TAKE ONE (1) TABLET EACH DAY  . PRADAXA 150 MG CAPS capsule TAKE ONE CAPSULE BY MOUTH TWICE A DAY  . STUDY MEDICATION REVEAL STUDY  . SYNTHROID 50 MCG tablet TAKE ONE (1) TABLET BY MOUTH EVERY DAY    No Known Allergies  Past Medical History  Diagnosis Date  . CORONARY ARTERY DISEASE 04/12/2007  . DIABETES MELLITUS, TYPE II 04/16/2007  . HYPERLIPIDEMIA 04/16/2007  . HYPERTENSION 04/12/2007  . HYPOTHYROIDISM 04/12/2007  . MYOCARDIAL INFARCTION, HX OF 04/12/2007  . OBESITY 06/16/2009  . SLEEP APNEA 04/12/2007  . ULCERATIVE COLITIS, LEFT SIDED 11/23/2010  . Ischemic cardiomyopathy     cath 35-40%  . Atrial fibrillation     initial diagnoses 2012  . B12 deficiency     per patient  . Diverticulosis of colon (without mention of hemorrhage)     ROS: Negative except as per HPI  BP 143/90  Pulse 84  Ht 5' 9"  (1.753 m)  Wt 135.807 kg (299 lb 6.4 oz)  BMI 44.19 kg/m2  PHYSICAL EXAM: Pt is alert and oriented, pleasant obese male in NAD HEENT: normal Neck: JVP - normal, carotids 2+= without bruits Lungs: CTA bilaterally CV: RRR without murmur or gallop Abd: soft, NT, Positive BS, obese Ext: trace pretibial edema bilaterally, distal pulses intact and equal Skin: warm/dry no rash  EKG:  Atrial fibrillation 84 beats per minute, a leftward axis, and nonspecific ST and T wave abnormality.  ASSESSMENT AND PLAN: 1. Coronary atherosclerosis, native vessel. Patient with history of stent thrombosis. The patient will stay on long-term antiplatelet therapy with clopidogrel in the setting of chronic anticoagulation. He is stable without symptoms of angina.   2. Hypertension. Blood pressure is controlled on a combination of amlodipine, Benicar, carvedilol, furosemide. His office reading is elevated, but he participates in the maintenance phase of cardiac rehabilitation and his blood pressure on average is in good range. BP earlier today by  his report was 127/78.   3. Atrial fibrillation, chronic. Continue anticoagulation. No bleeding problems reported.  4. Obesity. He continues to struggle with his weight. With limited mobility, doubt there is much chance he will lose significant weight. We reviewed dietary modification.  Sherren Mocha 03/26/2014 2:03 PM

## 2014-03-28 ENCOUNTER — Encounter (HOSPITAL_COMMUNITY)
Admission: RE | Admit: 2014-03-28 | Discharge: 2014-03-28 | Disposition: A | Discharge status: Home / Self Care | Payer: Self-pay | Source: Ambulatory Visit | Attending: Cardiovascular Disease | Admitting: Cardiovascular Disease

## 2014-03-31 ENCOUNTER — Encounter (HOSPITAL_COMMUNITY): Payer: Self-pay

## 2014-04-02 ENCOUNTER — Encounter (HOSPITAL_COMMUNITY)
Admission: RE | Admit: 2014-04-02 | Discharge: 2014-04-02 | Disposition: A | Discharge status: Home / Self Care | Payer: Self-pay | Source: Ambulatory Visit | Attending: Cardiovascular Disease | Admitting: Cardiovascular Disease

## 2014-04-04 ENCOUNTER — Encounter (HOSPITAL_COMMUNITY)
Admission: RE | Admit: 2014-04-04 | Discharge: 2014-04-04 | Disposition: A | Discharge status: Home / Self Care | Payer: Self-pay | Source: Ambulatory Visit | Attending: Cardiovascular Disease | Admitting: Cardiovascular Disease

## 2014-04-07 ENCOUNTER — Encounter: Payer: Self-pay | Admitting: Internal Medicine

## 2014-04-07 ENCOUNTER — Ambulatory Visit (INDEPENDENT_AMBULATORY_CARE_PROVIDER_SITE_OTHER): Payer: Medicare Other | Admitting: Internal Medicine

## 2014-04-07 ENCOUNTER — Encounter (HOSPITAL_COMMUNITY)
Admission: RE | Admit: 2014-04-07 | Discharge: 2014-04-07 | Disposition: A | Discharge status: Home / Self Care | Payer: Self-pay | Source: Ambulatory Visit | Attending: Cardiovascular Disease | Admitting: Cardiovascular Disease

## 2014-04-07 VITALS — BP 122/70 | HR 85 | Temp 98.0°F | Resp 12 | Wt 297.8 lb

## 2014-04-07 DIAGNOSIS — I2510 Atherosclerotic heart disease of native coronary artery without angina pectoris: Secondary | ICD-10-CM

## 2014-04-07 DIAGNOSIS — E1159 Type 2 diabetes mellitus with other circulatory complications: Secondary | ICD-10-CM | POA: Diagnosis not present

## 2014-04-07 LAB — HEMOGLOBIN A1C: Hgb A1c MFr Bld: 7.7 % — ABNORMAL HIGH (ref 4.6–6.5)

## 2014-04-07 NOTE — Progress Notes (Signed)
Patient ID: [name], male   DOB: 10-Jul-1946, 68 y.o.   MRN: [id number]  HPI: [name] is a 68 y.o.-year-old male, returning for DM2, dx 2004, non-insulin-dependent, uncontrolled, with complications (CAD, s/p MI). Last visit 3 mo ago.  Last hemoglobin A1c was: Lab Results  Component Value Date   HGBA1C 8.1* 01/06/2014   HGBA1C 7.8* 10/07/2013   HGBA1C 10.5* 07/08/2013  He started to change his eating habits since 03/2013.   Pt is on a regimen of: - Glipizide 5 >> 10 mg bid  - Metformin 1000 mg bid  - Januvia 100 mg daily  He does not miss doses.  Pt checks his sugars 2-3 (we reviewed his log): - am: 70-300 >> 157-169 >> 108-112 (around the Holidays 150-160 ocasionally) >> 105-136 >> 81-93 - 2h after breakfast: 169-193 >> n/c >> 163-281 >> 166-194 - before lunch 144 >> 110-120s >> n/c  - before dinner: 124-162 >> 110-120s >> 85-172 >> 82-110 - 2h after dinner: 142 >> n/c >> 150-306 (200s mostly) >> 177-193 No lows; he has hypoglycemia awareness at mid60s.   He started to eat less out >> sugars improved. However, his wife is a Radiographer, therapeutic >> he is still eating sweets. Pt's meals improved - cut back on red meat, less fried foods. He goes to [employer] place - 3x a week (MWF) - 1h/session, starting 8:30-9 am. He works 2x a week. He is seeing the nutritionist at the [facility].  - no CKD, last BUN/creatinine:  Lab Results  Component Value Date   BUN 12 04/02/2013   CREATININE 0.8 04/02/2013  he is on Benicar 40.  - last set of lipids: Lab Results  Component Value Date   CHOL 111 04/02/2013   HDL 72.30 04/02/2013   LDLCALC 5 11/01/2011   LDLDIRECT 12.8 04/02/2013   TRIG 230.0* 04/02/2013   CHOLHDL 2 04/02/2013  He is on omega 3 fatty acids.  - last eye exam was in 12/2012. No DR. Has another one (Dr [name]) this fall. - no numbness and tingling in his feet.  He also has a history of hypothyroidism, hyperlipidemia, OSA-on CPAP, very compliant, A. Fib-on  Plavix and pradaxa, IBD, GERD, vitamin B12 deficiency - on im B12, obesity.  ROS: Constitutional: no weight gain/loss, no fatigue, no subjective hyperthermia/hypothermia;  Eyes: no blurry vision, no xerophthalmia ENT: no sore throat, no nodules palpated in throat, no dysphagia/odynophagia, no hoarseness;  Cardiovascular: no CP/SOB/palpitations/+ leg swelling Respiratory: no cough/SOB Gastrointestinal: no N/V/D/C Musculoskeletal: no muscle/joint aches Skin: no rashes;   I reviewed pt's medications, allergies, PMH, social hx, family hx and no changes required, except as mentioned above.  PE: BP 122/70  Pulse 85  Temp(Src) 98 F (36.7 C) (Oral)  Resp 12  Wt 297 lb 12.8 oz (135.081 kg)  SpO2 95% Wt Readings from Last 3 Encounters:  04/07/14 297 lb 12.8 oz (135.081 kg)  03/26/14 299 lb 6.4 oz (135.807 kg)  01/06/14 301 lb (136.533 kg)   Constitutional: obese, protuberant abdomen, in NAD Eyes: PERRLA, EOMI, no exophthalmos ENT: moist mucous membranes, no thyromegaly, no cervical lymphadenopathy Cardiovascular: irregularly irregular, regular rate, No MRG; + bilateral leg swelling-wears compression hoses Respiratory: CTA B Gastrointestinal: abdomen soft, NT, ND, BS+ Musculoskeletal: no deformities, strength intact in all 4 Skin: moist, warm, no rashes  ASSESSMENT: 1. DM2, non-insulin-dependent, uncontrolled, with complications - CAD, s/p MI 10/2006 - s/p stent - sees [name]  PLAN:  1. Patient with long-standing DM2, on  oral meds; with improved postpradial sugars lately, after increasing Glipizide. - I suggested to:  Patient Instructions  - Please continue Glipizide 10 mg 2x a day - Continue Metformin 1000 mg 2x a day - Continue Januvia 100 mg daily  Continue to work on your food portions. Please return in 3 months with your sugar log.  - given more sugar logs - will check HbA1c today - Return to clinic in 3 mo with sugar log   Office Visit on 04/07/2014  Component  Date Value Ref Range Status  . Hemoglobin A1C 04/07/2014 7.7* 4.6 - 6.5 % Final   Glycemic Control Guidelines for People with Diabetes:Non Diabetic:  <6%Goal of Therapy: <7%Additional Action Suggested:  >8%    HbA1c better!

## 2014-04-07 NOTE — Patient Instructions (Signed)
-   Please continue Glipizide 10 mg 2x a day - Continue Metformin 1000 mg 2x a day - Continue Januvia 100 mg daily   Continue to work on your food portions.  Please return in 3 months with your sugar log.

## 2014-04-09 ENCOUNTER — Encounter (HOSPITAL_COMMUNITY)
Admission: RE | Admit: 2014-04-09 | Discharge: 2014-04-09 | Disposition: A | Discharge status: Home / Self Care | Payer: Self-pay | Source: Ambulatory Visit | Attending: Cardiovascular Disease | Admitting: Cardiovascular Disease

## 2014-04-09 DIAGNOSIS — Z5189 Encounter for other specified aftercare: Secondary | ICD-10-CM | POA: Insufficient documentation

## 2014-04-09 DIAGNOSIS — I10 Essential (primary) hypertension: Secondary | ICD-10-CM | POA: Insufficient documentation

## 2014-04-09 DIAGNOSIS — E039 Hypothyroidism, unspecified: Secondary | ICD-10-CM | POA: Insufficient documentation

## 2014-04-09 DIAGNOSIS — E669 Obesity, unspecified: Secondary | ICD-10-CM | POA: Insufficient documentation

## 2014-04-09 DIAGNOSIS — E785 Hyperlipidemia, unspecified: Secondary | ICD-10-CM | POA: Insufficient documentation

## 2014-04-09 DIAGNOSIS — I252 Old myocardial infarction: Secondary | ICD-10-CM | POA: Insufficient documentation

## 2014-04-09 DIAGNOSIS — I2510 Atherosclerotic heart disease of native coronary artery without angina pectoris: Secondary | ICD-10-CM | POA: Insufficient documentation

## 2014-04-09 DIAGNOSIS — E119 Type 2 diabetes mellitus without complications: Secondary | ICD-10-CM | POA: Insufficient documentation

## 2014-04-09 DIAGNOSIS — Z9861 Coronary angioplasty status: Secondary | ICD-10-CM | POA: Insufficient documentation

## 2014-04-11 ENCOUNTER — Encounter (HOSPITAL_COMMUNITY): Payer: Self-pay

## 2014-04-14 ENCOUNTER — Encounter (HOSPITAL_COMMUNITY)
Admission: RE | Admit: 2014-04-14 | Discharge: 2014-04-14 | Disposition: A | Discharge status: Home / Self Care | Payer: Self-pay | Source: Ambulatory Visit | Attending: Cardiovascular Disease | Admitting: Cardiovascular Disease

## 2014-04-16 ENCOUNTER — Encounter (HOSPITAL_COMMUNITY)
Admission: RE | Admit: 2014-04-16 | Discharge: 2014-04-16 | Disposition: A | Discharge status: Home / Self Care | Payer: Self-pay | Source: Ambulatory Visit | Attending: Cardiovascular Disease | Admitting: Cardiovascular Disease

## 2014-04-18 ENCOUNTER — Encounter (HOSPITAL_COMMUNITY)
Admission: RE | Admit: 2014-04-18 | Discharge: 2014-04-18 | Disposition: A | Discharge status: Home / Self Care | Payer: Self-pay | Source: Ambulatory Visit | Attending: Cardiovascular Disease | Admitting: Cardiovascular Disease

## 2014-04-21 ENCOUNTER — Encounter (HOSPITAL_COMMUNITY)
Admission: RE | Admit: 2014-04-21 | Discharge: 2014-04-21 | Disposition: A | Discharge status: Home / Self Care | Payer: Self-pay | Source: Ambulatory Visit | Attending: Cardiovascular Disease | Admitting: Cardiovascular Disease

## 2014-04-22 ENCOUNTER — Other Ambulatory Visit: Payer: Self-pay | Admitting: Cardiovascular Disease

## 2014-04-22 ENCOUNTER — Other Ambulatory Visit: Payer: Self-pay | Admitting: Internal Medicine

## 2014-04-23 ENCOUNTER — Encounter (HOSPITAL_COMMUNITY)
Admission: RE | Admit: 2014-04-23 | Discharge: 2014-04-23 | Disposition: A | Discharge status: Home / Self Care | Payer: Self-pay | Source: Ambulatory Visit | Attending: Cardiovascular Disease | Admitting: Cardiovascular Disease

## 2014-04-25 ENCOUNTER — Encounter (HOSPITAL_COMMUNITY)
Admission: RE | Admit: 2014-04-25 | Discharge: 2014-04-25 | Disposition: A | Discharge status: Home / Self Care | Payer: Self-pay | Source: Ambulatory Visit | Attending: Cardiovascular Disease | Admitting: Cardiovascular Disease

## 2014-04-28 ENCOUNTER — Encounter (HOSPITAL_COMMUNITY)
Admission: RE | Admit: 2014-04-28 | Discharge: 2014-04-28 | Disposition: A | Discharge status: Home / Self Care | Payer: Self-pay | Source: Ambulatory Visit | Attending: Cardiovascular Disease | Admitting: Cardiovascular Disease

## 2014-04-28 ENCOUNTER — Other Ambulatory Visit: Payer: Self-pay | Admitting: Internal Medicine

## 2014-04-30 ENCOUNTER — Encounter (HOSPITAL_COMMUNITY): Payer: Self-pay

## 2014-05-02 ENCOUNTER — Encounter (HOSPITAL_COMMUNITY)
Admission: RE | Admit: 2014-05-02 | Discharge: 2014-05-02 | Disposition: A | Discharge status: Home / Self Care | Payer: Self-pay | Source: Ambulatory Visit | Attending: Cardiovascular Disease | Admitting: Cardiovascular Disease

## 2014-05-05 ENCOUNTER — Encounter (HOSPITAL_COMMUNITY)
Admission: RE | Admit: 2014-05-05 | Discharge: 2014-05-05 | Disposition: A | Discharge status: Home / Self Care | Payer: Self-pay | Source: Ambulatory Visit | Attending: Cardiovascular Disease | Admitting: Cardiovascular Disease

## 2014-05-07 ENCOUNTER — Other Ambulatory Visit: Payer: Self-pay | Admitting: Internal Medicine

## 2014-05-07 ENCOUNTER — Encounter (HOSPITAL_COMMUNITY)
Admission: RE | Admit: 2014-05-07 | Discharge: 2014-05-07 | Disposition: A | Discharge status: Home / Self Care | Payer: Self-pay | Source: Ambulatory Visit | Attending: Cardiovascular Disease | Admitting: Cardiovascular Disease

## 2014-05-09 ENCOUNTER — Ambulatory Visit (INDEPENDENT_AMBULATORY_CARE_PROVIDER_SITE_OTHER): Payer: Medicare Other | Admitting: Family Medicine

## 2014-05-09 ENCOUNTER — Encounter: Payer: Self-pay | Admitting: Family Medicine

## 2014-05-09 ENCOUNTER — Encounter (HOSPITAL_COMMUNITY)
Admission: RE | Admit: 2014-05-09 | Discharge: 2014-05-09 | Disposition: A | Discharge status: Home / Self Care | Payer: Self-pay | Source: Ambulatory Visit | Attending: Cardiovascular Disease | Admitting: Cardiovascular Disease

## 2014-05-09 VITALS — BP 136/82 | HR 82 | Temp 98.0°F | Ht 69.0 in | Wt 302.0 lb

## 2014-05-09 DIAGNOSIS — I10 Essential (primary) hypertension: Secondary | ICD-10-CM

## 2014-05-09 DIAGNOSIS — I4891 Unspecified atrial fibrillation: Secondary | ICD-10-CM

## 2014-05-09 DIAGNOSIS — E538 Deficiency of other specified B group vitamins: Secondary | ICD-10-CM

## 2014-05-09 DIAGNOSIS — I480 Paroxysmal atrial fibrillation: Secondary | ICD-10-CM

## 2014-05-09 DIAGNOSIS — E1159 Type 2 diabetes mellitus with other circulatory complications: Secondary | ICD-10-CM

## 2014-05-09 DIAGNOSIS — I5022 Chronic systolic (congestive) heart failure: Secondary | ICD-10-CM

## 2014-05-09 DIAGNOSIS — I509 Heart failure, unspecified: Secondary | ICD-10-CM | POA: Insufficient documentation

## 2014-05-09 DIAGNOSIS — E039 Hypothyroidism, unspecified: Secondary | ICD-10-CM

## 2014-05-09 DIAGNOSIS — K529 Noninfective gastroenteritis and colitis, unspecified: Secondary | ICD-10-CM

## 2014-05-09 DIAGNOSIS — I2510 Atherosclerotic heart disease of native coronary artery without angina pectoris: Secondary | ICD-10-CM

## 2014-05-09 DIAGNOSIS — K5289 Other specified noninfective gastroenteritis and colitis: Secondary | ICD-10-CM | POA: Diagnosis not present

## 2014-05-09 DIAGNOSIS — E669 Obesity, unspecified: Secondary | ICD-10-CM

## 2014-05-09 DIAGNOSIS — E785 Hyperlipidemia, unspecified: Secondary | ICD-10-CM

## 2014-05-09 LAB — COMPREHENSIVE METABOLIC PANEL
ALT: 17 U/L (ref 0–53)
AST: 21 U/L (ref 0–37)
Albumin: 4.1 g/dL (ref 3.5–5.2)
Alkaline Phosphatase: 68 U/L (ref 39–117)
BUN: 17 mg/dL (ref 6–23)
CALCIUM: 9.2 mg/dL (ref 8.4–10.5)
CHLORIDE: 101 meq/L (ref 96–112)
CO2: 32 meq/L (ref 19–32)
CREATININE: 1 mg/dL (ref 0.4–1.5)
GFR: 78.91 mL/min (ref >60.00)
Glucose, Bld: 149 mg/dL — ABNORMAL HIGH (ref 70–99)
Potassium: 4.2 mEq/L (ref 3.5–5.1)
Sodium: 140 mEq/L (ref 135–145)
Total Bilirubin: 0.5 mg/dL (ref 0.2–1.2)
Total Protein: 6.9 g/dL (ref 6.0–8.3)

## 2014-05-09 LAB — LIPID PANEL
Cholesterol: 90 mg/dL (ref 0–200)
HDL: 59.5 mg/dL (ref >39.00)
LDL Cholesterol: 9 mg/dL (ref 0–99)
NONHDL: 30.5
TRIGLYCERIDES: 109 mg/dL (ref 0.0–149.0)
Total CHOL/HDL Ratio: 2
VLDL: 21.8 mg/dL (ref 0.0–40.0)

## 2014-05-09 LAB — CBC
HCT: 44.3 % (ref 39.0–52.0)
HEMOGLOBIN: 14.5 g/dL (ref 13.0–17.0)
MCHC: 32.8 g/dL (ref 30.0–36.0)
MCV: 87.8 fl (ref 78.0–100.0)
PLATELETS: 164 10*3/uL (ref 150.0–400.0)
RBC: 5.04 Mil/uL (ref 4.22–5.81)
RDW: 14.1 % (ref 11.5–15.5)
WBC: 9.3 10*3/uL (ref 4.0–10.5)

## 2014-05-09 LAB — TSH: TSH: 1.32 u[IU]/mL (ref 0.35–4.50)

## 2014-05-09 NOTE — Progress Notes (Signed)
Pre visit review using our clinic review tool, if applicable. No additional management support is needed unless otherwise documented below in the visit note. 

## 2014-05-09 NOTE — Assessment & Plan Note (Addendum)
Cared for by endocrinology. A1c 7.7 in June. Discussed how weight affects this.

## 2014-05-09 NOTE — Patient Instructions (Addendum)
Wonderful to meet you. Glad you are doing so well. Congrats on the upcoming grandbaby!  Let's check in about 3 months.   For your labs, we will call you with results or can send a message through mychart if you sign up.   Orders Placed This Encounter  Procedures  . CBC    [place]  . Comprehensive metabolic panel    [place]  . Lipid panel    [place]  . TSH    [place]   Recommend 25 lb weight loss at least to help with your OA. We can have you see a nutritionist if you would like.

## 2014-05-09 NOTE — Assessment & Plan Note (Signed)
Typically do not recommend weight loss at this age but given L knee pain and morbid obesity, suggested healthy lifestyle choices and at least 25 lbs weight loss. Discussed reducing portion size. Refuses dietician referral.

## 2014-05-09 NOTE — Assessment & Plan Note (Signed)
Check TSH today

## 2014-05-09 NOTE — Assessment & Plan Note (Signed)
Check lipids today. Not previously elevated. On study with cardiology in regards to lipids.

## 2014-05-09 NOTE — Assessment & Plan Note (Signed)
Well controlled. On benicar, carvedilol, and lasix 45m.

## 2014-05-09 NOTE — Assessment & Plan Note (Signed)
Well controlled on benicar 79m, amlodipine 138m carvedilol 2518m

## 2014-05-09 NOTE — Progress Notes (Signed)
Andrew Reddish, MD Phone: [phone]  Subjective:  Patient presents today to establish care with me as PCP. Chief complaint-noted.   A fib-rate controlled and anticoagulated with pradaxa. ROS- no palpitations  CAD/HTN- amlodipine 70m and benicar 429m carvedilol 25 mg. Doing well.  HLD- fish oil and on study for cholesterol (unclear medicatoin) Thyroid-take 50 mcg syntroid and does well on this. No recent hair or nail changes . CHF- lasix 4022maily. No recent weight gain more than 2-3 lbs. No recent increased swelling. Gets winded with 3 flights of stairs but can do a flight of stairs without difficulty. NYHA class II.   Obesity- does cardio rehab for 3 years. Exercises 3x a week and has nutrition classes. Recommended at least 25 lbs weight loss. Admits to too much of wrong kind of food. Discussed reducing portion size DM- sees endocrinology for diabetes. Metformin and glyburide and januvia. No low blood sugars. Last eye exam 2 years ago. Dr. ShaGershon CraneD- stable on mesalamine. No blood in stool.   ROS- no chest pain. Mild shortness of breath with 3 flights of stair.   The following were reviewed and entered/updated in epic: Past Medical History  Diagnosis Date  . CORONARY ARTERY DISEASE 04/12/2007    2 stents last in 2006.   . DMarland KitchenABETES MELLITUS, TYPE II 04/16/2007  . HYPERLIPIDEMIA 04/16/2007    reveal study. not sure of medication  . HYPERTENSION 04/12/2007  . HYPOTHYROIDISM 04/12/2007  . MYOCARDIAL INFARCTION, HX OF 04/12/2007  . OBESITY 06/16/2009  . SLEEP APNEA 04/12/2007    CPAP  . ULCERATIVE COLITIS, LEFT SIDED 11/23/2010  . Ischemic cardiomyopathy     cath 35-40%  . Atrial fibrillation     initial diagnoses 2012  . B12 deficiency     per patient previously taking shots  . Diverticulosis of colon (without mention of hemorrhage)    Patient Active Problem List   Diagnosis Date Noted  . CHF (congestive heart failure), NYHA class II 05/09/2014    Priority: High  . IBD  (inflammatory bowel disease) 08/09/2011    Priority: High  . Atrial fibrillation 12/08/2010    Priority: High  . Type II or unspecified type diabetes mellitus with peripheral circulatory disorders, uncontrolled(250.72) 04/16/2007    Priority: High  . CORONARY ARTERY DISEASE 04/12/2007    Priority: High  . Obesity 08/09/2011    Priority: Medium  . HYPERLIPIDEMIA 04/16/2007    Priority: Medium  . HYPOTHYROIDISM 04/12/2007    Priority: Medium  . HYPERTENSION 04/12/2007    Priority: Medium  . SLEEP APNEA 04/12/2007    Priority: Medium  . Allergic rhinitis 06/25/2012    Priority: Low  . GERD (gastroesophageal reflux disease) 08/09/2011    Priority: Low  . VITAMIN B12 DEFICIENCY 11/29/2010    Priority: Low   Past Surgical History  Procedure Laterality Date  . Coronary stent placement  2008    LAD   . Cardiac catheterization  10/2002    STENT. 2 stents Dr. BroMaurene Capes Tonsillectomy     Family History  Problem Relation Age of Onset  . Heart attack Mother     mid 70s60s Heart disease Father     H/O CAD, CABG, VALVE SURGERY  . Hypertension Brother   . Obesity Brother   . Stomach cancer Maternal Aunt   . Stomach cancer Paternal Grandmother     ? colon     Medications- reviewed and updated Current Outpatient Prescriptions  Medication Sig Dispense Refill  . amLODipine (  NORVASC) 10 MG tablet TAKE ONE (1) TABLET EACH DAY  30 tablet  11  . BENICAR 40 MG tablet TAKE ONE (1) TABLET EACH DAY  30 tablet  0  . carvedilol (COREG) 25 MG tablet TAKE ONE TABLET TWICE DAILY WITH A MEAL  60 tablet  11  . clopidogrel (PLAVIX) 75 MG tablet TAKE ONE (1) TABLET EACH DAY  30 tablet  5  . fish oil-omega-3 fatty acids 1000 MG capsule Take 2 g by mouth daily.       . furosemide (LASIX) 40 MG tablet TAKE ONE (1) TABLET EACH DAY  30 tablet  11  . glipiZIDE (GLUCOTROL) 10 MG tablet Take 1 tablet (10 mg total) by mouth 2 (two) times daily before a meal.  180 tablet  3  . Glucosamine-Chondroitin (OSTEO  BI-FLEX REGULAR STRENGTH) 250-200 MG TABS Take 1 tablet by mouth 2 (two) times daily.       Marland Kitchen JANUVIA 100 MG tablet TAKE ONE (1) TABLET EACH DAY  30 tablet  3  . mesalamine (APRISO) 0.375 G 24 hr capsule TAKE FOUR CAPSULES BY MOUTH DAILY  120 capsule  6  . metFORMIN (GLUCOPHAGE) 1000 MG tablet Take 1 tablet (1,000 mg total) by mouth 2 (two) times daily with a meal.  180 tablet  3  . ONE TOUCH ULTRA TEST test strip 1 each by Other route daily.       . potassium chloride SA (K-DUR,KLOR-CON) 20 MEQ tablet TAKE ONE TABLET ONCE DAILY  90 tablet  3  . PRADAXA 150 MG CAPS capsule TAKE ONE (1) CAPSULE BY MOUTH TWICE A DAY. (EVERY 12 HOURS.)  60 capsule  11  . STUDY MEDICATION REVEAL STUDY      . SYNTHROID 50 MCG tablet TAKE ONE TABLET ONCE DAILY  90 tablet  0   No current facility-administered medications for this visit.   Allergies-reviewed and updated No Known Allergies  History   Social History  . Marital Status: Married    Spouse Name: N/A    Number of Children: 1  . Years of Education: N/A   Occupational History  . RETIRED    Social History Main Topics  . Smoking status: Former Smoker -- 1.00 packs/day for 5 years    Types: Cigarettes    Quit date: 04/14/1967  . Smokeless tobacco: Never Used  . Alcohol Use: Yes     Comment: social, beer once a month or two - light  . Drug Use: No  . Sexual Activity: None   Other Topics Concern  . None   Social History Narrative   Cardiorehab 3 days a week. 45 minutes to an hour-stationary bike.    GRANDDAUGHTER (MS. PETTIGREW) IS AN RN ON 2000 @ [facility]      Retired from [employer]   Married for 37 years in 2015, married previously for 14 years. Daughter with first wife and 3 grandkids.    Lives alone with wife. Get to see grandkids a lot.    Hobbies-yardwork, previously liked to hunt and fish, does some target shooting   ROS--See HPI   Objective: BP 136/82  Pulse 82  Temp(Src) 98 F (36.7 C) (Oral)  Ht  5' 9"  (1.753 m)  Wt 302 lb (136.986 kg)  BMI 44.58 kg/m2  SpO2 93% Gen: NAD, resting comfortably on table, morbidly obese HEENT: Mucous membranes are moist. Oropharynx normal Neck: no thyromegaly CV: RRR no murmurs rubs or gallops Lungs: CTAB no crackles, wheeze, rhonchi Abdomen: soft/nontender/nondistended/normal  bowel sounds. No rebound or guarding. Protuberant.  Ext: 1+ on left but mildly bigger than right which is also 1+ (patient states chronically like this) Skin: warm, dry, no rash  Assessment/Plan:  Atrial fibrillation appears to be in sinus rhythm today. Rate controlled otherwise. COntinue pradaxa.   CORONARY ARTERY DISEASE Asymptomatic. Continue blood pressure control and lipid management (in a study with cardiology) and also on fish oil. Continue plavix.   HYPERTENSION Well controlled on benicar 63m, amlodipine 124m carvedilol 2543m  HYPERLIPIDEMIA Check lipids today. Not previously elevated. On study with cardiology in regards to lipids.   HYPOTHYROIDISM Check TSH today  CHF (congestive heart failure), NYHA class II Well controlled. On benicar, carvedilol, and lasix 59m36m Obesity Typically do not recommend weight loss at this age but given L knee pain and morbid obesity, suggested healthy lifestyle choices and at least 25 lbs weight loss. Discussed reducing portion size. Refuses dietician referral.   Type II or unspecified type diabetes mellitus with peripheral circulatory disorders, uncontrolled(250.72) Cared for by endocrinology. A1c 7.7 in June. Discussed how weight affects this.   IBD (inflammatory bowel disease) Stable on mesalamine   Orders Placed This Encounter  Procedures  . CBC    [place]  . Comprehensive metabolic panel    [place]  . Lipid panel    [place]  . TSH    [place]

## 2014-05-09 NOTE — Assessment & Plan Note (Signed)
Stable on mesalamine

## 2014-05-09 NOTE — Assessment & Plan Note (Addendum)
Asymptomatic. Continue blood pressure control and lipid management (in a study with cardiology) and also on fish oil. Continue plavix.

## 2014-05-09 NOTE — Assessment & Plan Note (Signed)
appears to be in sinus rhythm today. Rate controlled otherwise. COntinue pradaxa.

## 2014-05-12 ENCOUNTER — Encounter (HOSPITAL_COMMUNITY): Payer: Self-pay

## 2014-05-14 ENCOUNTER — Encounter (HOSPITAL_COMMUNITY): Payer: Self-pay

## 2014-05-16 ENCOUNTER — Encounter (HOSPITAL_COMMUNITY): Payer: Self-pay

## 2014-05-19 ENCOUNTER — Encounter (HOSPITAL_COMMUNITY)
Admission: RE | Admit: 2014-05-19 | Discharge: 2014-05-19 | Disposition: A | Discharge status: Home / Self Care | Payer: Self-pay | Source: Ambulatory Visit | Attending: Cardiovascular Disease | Admitting: Cardiovascular Disease

## 2014-05-19 DIAGNOSIS — E669 Obesity, unspecified: Secondary | ICD-10-CM | POA: Insufficient documentation

## 2014-05-19 DIAGNOSIS — I10 Essential (primary) hypertension: Secondary | ICD-10-CM | POA: Insufficient documentation

## 2014-05-19 DIAGNOSIS — E039 Hypothyroidism, unspecified: Secondary | ICD-10-CM | POA: Insufficient documentation

## 2014-05-19 DIAGNOSIS — E119 Type 2 diabetes mellitus without complications: Secondary | ICD-10-CM | POA: Insufficient documentation

## 2014-05-19 DIAGNOSIS — E785 Hyperlipidemia, unspecified: Secondary | ICD-10-CM | POA: Insufficient documentation

## 2014-05-19 DIAGNOSIS — Z5189 Encounter for other specified aftercare: Secondary | ICD-10-CM | POA: Insufficient documentation

## 2014-05-19 DIAGNOSIS — I252 Old myocardial infarction: Secondary | ICD-10-CM | POA: Insufficient documentation

## 2014-05-19 DIAGNOSIS — I2510 Atherosclerotic heart disease of native coronary artery without angina pectoris: Secondary | ICD-10-CM | POA: Insufficient documentation

## 2014-05-19 DIAGNOSIS — Z9861 Coronary angioplasty status: Secondary | ICD-10-CM | POA: Insufficient documentation

## 2014-05-21 ENCOUNTER — Encounter (HOSPITAL_COMMUNITY): Payer: Self-pay

## 2014-05-23 ENCOUNTER — Encounter (HOSPITAL_COMMUNITY): Payer: Self-pay

## 2014-05-23 ENCOUNTER — Other Ambulatory Visit: Payer: Self-pay | Admitting: Gastroenterology

## 2014-05-23 ENCOUNTER — Other Ambulatory Visit: Payer: Self-pay | Admitting: Internal Medicine

## 2014-05-26 ENCOUNTER — Encounter (HOSPITAL_COMMUNITY)
Admission: RE | Admit: 2014-05-26 | Discharge: 2014-05-26 | Disposition: A | Discharge status: Home / Self Care | Payer: Self-pay | Source: Ambulatory Visit | Attending: Cardiovascular Disease | Admitting: Cardiovascular Disease

## 2014-05-26 NOTE — Telephone Encounter (Signed)
Dr. Fuller Plan,  Patient has a follow up colonoscopy due with you per Dr. Sharlett Iles note for 2017. Patient is requesting refill on Apriso. Is it okay to refill?

## 2014-05-26 NOTE — Telephone Encounter (Signed)
Are these ok to refill? 

## 2014-05-26 NOTE — Telephone Encounter (Signed)
OK for refill. He should have an annual office visit in 10/2014.

## 2014-05-28 ENCOUNTER — Encounter (HOSPITAL_COMMUNITY)
Admission: RE | Admit: 2014-05-28 | Discharge: 2014-05-28 | Disposition: A | Discharge status: Home / Self Care | Payer: Self-pay | Source: Ambulatory Visit | Attending: Cardiovascular Disease | Admitting: Cardiovascular Disease

## 2014-05-30 ENCOUNTER — Encounter (HOSPITAL_COMMUNITY): Payer: Self-pay

## 2014-06-02 ENCOUNTER — Encounter (HOSPITAL_COMMUNITY)
Admission: RE | Admit: 2014-06-02 | Discharge: 2014-06-02 | Disposition: A | Discharge status: Home / Self Care | Payer: Self-pay | Source: Ambulatory Visit | Attending: Cardiovascular Disease | Admitting: Cardiovascular Disease

## 2014-06-04 ENCOUNTER — Encounter (HOSPITAL_COMMUNITY)
Admission: RE | Admit: 2014-06-04 | Discharge: 2014-06-04 | Disposition: A | Discharge status: Home / Self Care | Payer: Self-pay | Source: Ambulatory Visit | Attending: Cardiovascular Disease | Admitting: Cardiovascular Disease

## 2014-06-06 ENCOUNTER — Encounter (HOSPITAL_COMMUNITY): Payer: Self-pay

## 2014-06-09 ENCOUNTER — Encounter (HOSPITAL_COMMUNITY): Payer: Self-pay

## 2014-06-11 ENCOUNTER — Encounter (HOSPITAL_COMMUNITY): Payer: Self-pay

## 2014-06-13 ENCOUNTER — Encounter (HOSPITAL_COMMUNITY): Payer: Self-pay

## 2014-06-16 ENCOUNTER — Encounter (HOSPITAL_COMMUNITY): Payer: Self-pay

## 2014-06-18 ENCOUNTER — Encounter (HOSPITAL_COMMUNITY)
Admission: RE | Admit: 2014-06-18 | Discharge: 2014-06-18 | Disposition: A | Discharge status: Home / Self Care | Payer: Self-pay | Source: Ambulatory Visit | Attending: Cardiovascular Disease | Admitting: Cardiovascular Disease

## 2014-06-18 DIAGNOSIS — I10 Essential (primary) hypertension: Secondary | ICD-10-CM | POA: Insufficient documentation

## 2014-06-18 DIAGNOSIS — Z9861 Coronary angioplasty status: Secondary | ICD-10-CM | POA: Insufficient documentation

## 2014-06-18 DIAGNOSIS — Z5189 Encounter for other specified aftercare: Secondary | ICD-10-CM | POA: Insufficient documentation

## 2014-06-18 DIAGNOSIS — E669 Obesity, unspecified: Secondary | ICD-10-CM | POA: Insufficient documentation

## 2014-06-18 DIAGNOSIS — E785 Hyperlipidemia, unspecified: Secondary | ICD-10-CM | POA: Insufficient documentation

## 2014-06-18 DIAGNOSIS — E119 Type 2 diabetes mellitus without complications: Secondary | ICD-10-CM | POA: Insufficient documentation

## 2014-06-18 DIAGNOSIS — E039 Hypothyroidism, unspecified: Secondary | ICD-10-CM | POA: Insufficient documentation

## 2014-06-18 DIAGNOSIS — I252 Old myocardial infarction: Secondary | ICD-10-CM | POA: Insufficient documentation

## 2014-06-18 DIAGNOSIS — I2510 Atherosclerotic heart disease of native coronary artery without angina pectoris: Secondary | ICD-10-CM | POA: Insufficient documentation

## 2014-06-20 ENCOUNTER — Encounter (HOSPITAL_COMMUNITY): Payer: Self-pay

## 2014-06-23 ENCOUNTER — Encounter (HOSPITAL_COMMUNITY): Payer: Self-pay

## 2014-06-25 ENCOUNTER — Other Ambulatory Visit: Payer: Self-pay | Admitting: Internal Medicine

## 2014-06-25 ENCOUNTER — Encounter (HOSPITAL_COMMUNITY)
Admission: RE | Admit: 2014-06-25 | Discharge: 2014-06-25 | Disposition: A | Discharge status: Home / Self Care | Payer: Self-pay | Source: Ambulatory Visit | Attending: Cardiovascular Disease | Admitting: Cardiovascular Disease

## 2014-06-25 ENCOUNTER — Other Ambulatory Visit: Payer: Self-pay | Admitting: Cardiovascular Disease

## 2014-06-27 ENCOUNTER — Encounter (HOSPITAL_COMMUNITY)
Admission: RE | Admit: 2014-06-27 | Discharge: 2014-06-27 | Disposition: A | Discharge status: Home / Self Care | Payer: Self-pay | Source: Ambulatory Visit | Attending: Cardiovascular Disease | Admitting: Cardiovascular Disease

## 2014-06-30 ENCOUNTER — Encounter (HOSPITAL_COMMUNITY)
Admission: RE | Admit: 2014-06-30 | Discharge: 2014-06-30 | Disposition: A | Discharge status: Home / Self Care | Payer: Self-pay | Source: Ambulatory Visit | Attending: Cardiovascular Disease | Admitting: Cardiovascular Disease

## 2014-07-02 ENCOUNTER — Encounter (HOSPITAL_COMMUNITY): Payer: Self-pay

## 2014-07-04 ENCOUNTER — Encounter (HOSPITAL_COMMUNITY)
Admission: RE | Admit: 2014-07-04 | Discharge: 2014-07-04 | Disposition: A | Discharge status: Home / Self Care | Payer: Self-pay | Source: Ambulatory Visit | Attending: Cardiovascular Disease | Admitting: Cardiovascular Disease

## 2014-07-07 ENCOUNTER — Encounter (HOSPITAL_COMMUNITY)
Admission: RE | Admit: 2014-07-07 | Discharge: 2014-07-07 | Disposition: A | Discharge status: Home / Self Care | Payer: Self-pay | Source: Ambulatory Visit | Attending: Cardiovascular Disease | Admitting: Cardiovascular Disease

## 2014-07-09 ENCOUNTER — Encounter (HOSPITAL_COMMUNITY)
Admission: RE | Admit: 2014-07-09 | Discharge: 2014-07-09 | Disposition: A | Discharge status: Home / Self Care | Payer: Self-pay | Source: Ambulatory Visit | Attending: Cardiovascular Disease | Admitting: Cardiovascular Disease

## 2014-07-11 ENCOUNTER — Ambulatory Visit: Payer: Medicare Other | Admitting: Internal Medicine

## 2014-07-11 ENCOUNTER — Ambulatory Visit (INDEPENDENT_AMBULATORY_CARE_PROVIDER_SITE_OTHER): Payer: Medicare Other | Admitting: Internal Medicine

## 2014-07-11 ENCOUNTER — Encounter (HOSPITAL_COMMUNITY)
Admission: RE | Admit: 2014-07-11 | Discharge: 2014-07-11 | Disposition: A | Discharge status: Home / Self Care | Payer: Self-pay | Source: Ambulatory Visit | Attending: Cardiovascular Disease | Admitting: Cardiovascular Disease

## 2014-07-11 ENCOUNTER — Encounter: Payer: Self-pay | Admitting: Internal Medicine

## 2014-07-11 ENCOUNTER — Other Ambulatory Visit: Payer: Self-pay | Admitting: *Deleted

## 2014-07-11 VITALS — BP 149/86 | HR 85 | Temp 97.8°F | Resp 12 | Wt 299.0 lb

## 2014-07-11 DIAGNOSIS — Z5189 Encounter for other specified aftercare: Secondary | ICD-10-CM | POA: Insufficient documentation

## 2014-07-11 DIAGNOSIS — E1159 Type 2 diabetes mellitus with other circulatory complications: Secondary | ICD-10-CM

## 2014-07-11 DIAGNOSIS — Z23 Encounter for immunization: Secondary | ICD-10-CM

## 2014-07-11 DIAGNOSIS — I252 Old myocardial infarction: Secondary | ICD-10-CM | POA: Insufficient documentation

## 2014-07-11 DIAGNOSIS — I2510 Atherosclerotic heart disease of native coronary artery without angina pectoris: Secondary | ICD-10-CM

## 2014-07-11 DIAGNOSIS — Z9861 Coronary angioplasty status: Secondary | ICD-10-CM | POA: Insufficient documentation

## 2014-07-11 DIAGNOSIS — E119 Type 2 diabetes mellitus without complications: Secondary | ICD-10-CM | POA: Insufficient documentation

## 2014-07-11 DIAGNOSIS — I10 Essential (primary) hypertension: Secondary | ICD-10-CM | POA: Insufficient documentation

## 2014-07-11 DIAGNOSIS — E1151 Type 2 diabetes mellitus with diabetic peripheral angiopathy without gangrene: Secondary | ICD-10-CM

## 2014-07-11 LAB — HEMOGLOBIN A1C: Hgb A1c MFr Bld: 7.7 % — ABNORMAL HIGH (ref 4.6–6.5)

## 2014-07-11 NOTE — Patient Instructions (Signed)
-   Please continue Glipizide 10 mg 2x a day  - Continue Metformin 1000 mg 2x a day  - Continue Januvia 100 mg daily   Please return in 3 months with your sugar log.  Please stop at the lab.

## 2014-07-11 NOTE — Progress Notes (Signed)
Patient ID: [name], male   DOB: [birth year], 68 y.o.   MRN: [id number]  HPI: [name] is a 68 y.o.-year-old male, returning for DM2, dx 2004, non-insulin-dependent, uncontrolled, with complications (CAD, s/p MI). Last visit 3 mo ago.  Last hemoglobin A1c was: Lab Results  Component Value Date   HGBA1C 7.7* 04/07/2014   HGBA1C 8.1* 01/06/2014   HGBA1C 7.8* 10/07/2013  He started to change his eating habits since 03/2013.   Pt is on a regimen of: - Glipizide 10 mg bid  - Metformin 1000 mg bid  - Januvia 100 mg daily  He does not miss doses.  Pt checks his sugars 2-3 (we reviewed his log) - they are improved! - sugars not as low in am and much better after dinner: - am: 70-300 >> 157-169 >> 108-112 (around the Holidays 150-160 ocasionally) >> 105-136 >> 81-93 >> 93-112 - 2h after breakfast: 169-193 >> n/c >> 163-281 >> 166-194 >> 116-159, 184 - before lunch 144 >> 110-120s >> n/c  - before dinner: 124-162 >> 110-120s >> 85-172 >> 82-110 >> 91-122 - 2h after dinner: 142 >> n/c >> 150-306 (200s mostly) >> 177-193 >> 119-156 No lows; he has hypoglycemia awareness at mid60s.   Pt's meals improved - cut back on red meat, less fried foods. His wife is a Radiographer, therapeutic >> he is still eating sweets occasionally.  He goes to [employer] place - 3x a week (MWF) - 1h/session, starting 8:30-9 am. He works 2x a week. He is seeing the nutritionist at the Cardiac Rehab.  - no CKD, last BUN/creatinine:  Lab Results  Component Value Date   BUN 17 05/09/2014   CREATININE 1.0 05/09/2014  he is on Benicar 40.  - last set of lipids: Lab Results  Component Value Date   CHOL 90 05/09/2014   HDL 59.50 05/09/2014   LDLCALC 9 05/09/2014   LDLDIRECT 12.8 04/02/2013   TRIG 109.0 05/09/2014   CHOLHDL 2 05/09/2014  He is on omega 3 fatty acids.  - last eye exam was in 12/2012. No DR. Has another one (Dr [name]) next week. - no numbness and tingling in his feet.  He also has a history of  hypothyroidism, hyperlipidemia, OSA-on CPAP, very compliant, A. Fib-on Plavix and pradaxa, IBD, GERD, vitamin B12 deficiency - on im B12, obesity.  ROS: Constitutional: no weight gain/loss, no fatigue, no subjective hyperthermia/hypothermia Eyes: no blurry vision, no xerophthalmia ENT: no sore throat, no nodules palpated in throat, no dysphagia/odynophagia, no hoarseness;  Cardiovascular: no CP/SOB/palpitations/+ leg swelling Respiratory: no cough/SOB Gastrointestinal: no N/V/D/C Musculoskeletal: no muscle/joint aches Skin: no rashes;   I reviewed pt's medications, allergies, PMH, social hx, family hx and no changes required, except as mentioned above.  PE: BP 149/86  Pulse 85  Temp(Src) 97.8 F (36.6 C) (Oral)  Resp 12  Wt 299 lb (135.626 kg)  SpO2 96% Wt Readings from Last 3 Encounters:  07/11/14 299 lb (135.626 kg)  05/09/14 302 lb (136.986 kg)  04/07/14 297 lb 12.8 oz (135.081 kg)   Constitutional: obese, protuberant abdomen, in NAD Eyes: PERRLA, EOMI, no exophthalmos ENT: moist mucous membranes, no thyromegaly, no cervical lymphadenopathy Cardiovascular: irregularly irregular, regular rate, No MRG; + bilateral leg swelling Respiratory: CTA B Gastrointestinal: abdomen soft, NT, ND, BS+ Musculoskeletal: no deformities, strength intact in all 4 Skin: moist, warm, no rashes  ASSESSMENT: 1. DM2, non-insulin-dependent, uncontrolled, with complications - CAD, s/p MI 10/2006 - s/p stent - sees [name]  PLAN:  1. Patient with long-standing DM2, on oral meds; with much improved lately after changing his diet. He is doing a great job! - I suggested to:  Patient Instructions  - Please continue Glipizide 10 mg 2x a day - Continue Metformin 1000 mg 2x a day - Continue Januvia 100 mg daily  Please stop at the lab. Please return in 3 months with your sugar log.  - given more sugar logs - will check HbA1c today - will have new eye exam next week - will give the flu vaccine  today - Return to clinic in 3 mo with sugar log   Office Visit on 07/11/2014  Component Date Value Ref Range Status  . Hemoglobin A1C 07/11/2014 7.7* 4.6 - 6.5 % Final   Glycemic Control Guidelines for People with Diabetes:Non Diabetic:  <6%Goal of Therapy: <7%Additional Action Suggested:  >8%    I am very surprised that the HbA1c is not better!

## 2014-07-14 ENCOUNTER — Encounter (HOSPITAL_COMMUNITY)
Admission: RE | Admit: 2014-07-14 | Discharge: 2014-07-14 | Disposition: A | Discharge status: Home / Self Care | Payer: Self-pay | Source: Ambulatory Visit | Attending: Cardiovascular Disease | Admitting: Cardiovascular Disease

## 2014-07-16 ENCOUNTER — Encounter (HOSPITAL_COMMUNITY)
Admission: RE | Admit: 2014-07-16 | Discharge: 2014-07-16 | Disposition: A | Discharge status: Home / Self Care | Payer: Self-pay | Source: Ambulatory Visit | Attending: Cardiovascular Disease | Admitting: Cardiovascular Disease

## 2014-07-16 DIAGNOSIS — E119 Type 2 diabetes mellitus without complications: Secondary | ICD-10-CM | POA: Diagnosis not present

## 2014-07-16 LAB — HM DIABETES EYE EXAM

## 2014-07-18 ENCOUNTER — Encounter (HOSPITAL_COMMUNITY)
Admission: RE | Admit: 2014-07-18 | Discharge: 2014-07-18 | Disposition: A | Discharge status: Home / Self Care | Payer: Self-pay | Source: Ambulatory Visit | Attending: Cardiovascular Disease | Admitting: Cardiovascular Disease

## 2014-07-21 ENCOUNTER — Encounter (HOSPITAL_COMMUNITY): Payer: Self-pay

## 2014-07-23 ENCOUNTER — Encounter (HOSPITAL_COMMUNITY): Payer: Self-pay

## 2014-07-24 ENCOUNTER — Other Ambulatory Visit: Payer: Self-pay | Admitting: Family Medicine

## 2014-07-25 ENCOUNTER — Encounter (HOSPITAL_COMMUNITY)
Admission: RE | Admit: 2014-07-25 | Discharge: 2014-07-25 | Disposition: A | Discharge status: Home / Self Care | Payer: Self-pay | Source: Ambulatory Visit | Attending: Cardiovascular Disease | Admitting: Cardiovascular Disease

## 2014-07-28 ENCOUNTER — Encounter (HOSPITAL_COMMUNITY)
Admission: RE | Admit: 2014-07-28 | Discharge: 2014-07-28 | Disposition: A | Discharge status: Home / Self Care | Payer: Self-pay | Source: Ambulatory Visit | Attending: Cardiovascular Disease | Admitting: Cardiovascular Disease

## 2014-07-30 ENCOUNTER — Encounter (HOSPITAL_COMMUNITY)
Admission: RE | Admit: 2014-07-30 | Discharge: 2014-07-30 | Disposition: A | Discharge status: Home / Self Care | Payer: Self-pay | Source: Ambulatory Visit | Attending: Cardiovascular Disease | Admitting: Cardiovascular Disease

## 2014-08-01 ENCOUNTER — Encounter (HOSPITAL_COMMUNITY): Payer: Self-pay

## 2014-08-04 ENCOUNTER — Encounter (HOSPITAL_COMMUNITY)
Admission: RE | Admit: 2014-08-04 | Discharge: 2014-08-04 | Disposition: A | Discharge status: Home / Self Care | Payer: Self-pay | Source: Ambulatory Visit | Attending: Cardiovascular Disease | Admitting: Cardiovascular Disease

## 2014-08-06 ENCOUNTER — Encounter (HOSPITAL_COMMUNITY)
Admission: RE | Admit: 2014-08-06 | Discharge: 2014-08-06 | Disposition: A | Discharge status: Home / Self Care | Payer: Self-pay | Source: Ambulatory Visit | Attending: Cardiovascular Disease | Admitting: Cardiovascular Disease

## 2014-08-06 ENCOUNTER — Telehealth: Payer: Self-pay | Admitting: Family Medicine

## 2014-08-06 MED ORDER — SYNTHROID 50 MCG PO TABS: 50.0000 ug | ORAL_TABLET | Freq: Every day | ORAL | Status: DC

## 2014-08-06 NOTE — Telephone Encounter (Signed)
Medication refilled

## 2014-08-06 NOTE — Telephone Encounter (Signed)
[place] is requesting re-fill on SYNTHROID 50 MCG tablet

## 2014-08-08 ENCOUNTER — Encounter (HOSPITAL_COMMUNITY)
Admission: RE | Admit: 2014-08-08 | Discharge: 2014-08-08 | Disposition: A | Discharge status: Home / Self Care | Payer: Self-pay | Source: Ambulatory Visit | Attending: Cardiovascular Disease | Admitting: Cardiovascular Disease

## 2014-08-11 ENCOUNTER — Ambulatory Visit (INDEPENDENT_AMBULATORY_CARE_PROVIDER_SITE_OTHER): Payer: Medicare Other | Admitting: Family Medicine

## 2014-08-11 ENCOUNTER — Encounter: Payer: Self-pay | Admitting: Family Medicine

## 2014-08-11 ENCOUNTER — Encounter (HOSPITAL_COMMUNITY)
Admission: RE | Admit: 2014-08-11 | Discharge: 2014-08-11 | Disposition: A | Discharge status: Home / Self Care | Payer: Self-pay | Source: Ambulatory Visit | Attending: Cardiovascular Disease | Admitting: Cardiovascular Disease

## 2014-08-11 VITALS — BP 130/72 | HR 78 | Temp 97.9°F | Wt 299.0 lb

## 2014-08-11 DIAGNOSIS — I10 Essential (primary) hypertension: Secondary | ICD-10-CM | POA: Diagnosis not present

## 2014-08-11 DIAGNOSIS — E034 Atrophy of thyroid (acquired): Secondary | ICD-10-CM | POA: Diagnosis not present

## 2014-08-11 DIAGNOSIS — Z9861 Coronary angioplasty status: Secondary | ICD-10-CM | POA: Insufficient documentation

## 2014-08-11 DIAGNOSIS — E785 Hyperlipidemia, unspecified: Secondary | ICD-10-CM

## 2014-08-11 DIAGNOSIS — E038 Other specified hypothyroidism: Secondary | ICD-10-CM | POA: Diagnosis not present

## 2014-08-11 DIAGNOSIS — I252 Old myocardial infarction: Secondary | ICD-10-CM | POA: Insufficient documentation

## 2014-08-11 DIAGNOSIS — E119 Type 2 diabetes mellitus without complications: Secondary | ICD-10-CM | POA: Insufficient documentation

## 2014-08-11 DIAGNOSIS — Z5189 Encounter for other specified aftercare: Secondary | ICD-10-CM | POA: Insufficient documentation

## 2014-08-11 DIAGNOSIS — I2510 Atherosclerotic heart disease of native coronary artery without angina pectoris: Secondary | ICD-10-CM | POA: Insufficient documentation

## 2014-08-11 MED ORDER — SYNTHROID 50 MCG PO TABS: 50.0000 ug | ORAL_TABLET | Freq: Every day | ORAL | Status: DC

## 2014-08-11 NOTE — Progress Notes (Signed)
Garret Reddish, MD Phone: [phone]  Subjective:   [name] is a 68 y.o. year old very pleasant male patient who presents with the following:  Hypothyroidism-well-controlled: Synthroid 50 mcg On thyroid medication-gas request refill ROS-No hair or nail changes. No heat/cold intolerance. No constipation or diarrhea. Denies shakiness or anxiety.  Hypertension-well-controlled on Benicar, carvedilol, Lasix, amlodipine BP Readings from Last 3 Encounters:  08/11/14 130/72  07/11/14 149/86  05/09/14 136/82  exercising 3 times a week with cardiac rehabilitation Home BP monitoring-no Compliant with medications-yes without side effects ROS-Denies any CP, HA, SOB, blurry vision.  Hyperlipidemia-excellent control on study medication  Lab Results  Component Value Date   [place] 9 05/09/2014   On statin: not that we know of but on study Regular exercise: as above ROS- no chest pain or shortness of breath. No myalgias  Past Medical History- Patient Active Problem List   Diagnosis Date Noted  . CHF (congestive heart failure), NYHA class II 05/09/2014    Priority: High  . IBD (inflammatory bowel disease) 08/09/2011    Priority: High  . Atrial fibrillation 12/08/2010    Priority: High  . Type II diabetes mellitus with peripheral circulatory disorder 04/16/2007    Priority: High  . CORONARY ARTERY DISEASE 04/12/2007    Priority: High  . Obesity 08/09/2011    Priority: Medium  . HYPERLIPIDEMIA 04/16/2007    Priority: Medium  . HYPOTHYROIDISM 04/12/2007    Priority: Medium  . HYPERTENSION 04/12/2007    Priority: Medium  . SLEEP APNEA 04/12/2007    Priority: Medium  . Allergic rhinitis 06/25/2012    Priority: Low  . GERD (gastroesophageal reflux disease) 08/09/2011    Priority: Low  . VITAMIN B12 DEFICIENCY 11/29/2010    Priority: Low   Medications- reviewed and updated Current Outpatient Prescriptions  Medication Sig Dispense Refill  . amLODipine (NORVASC) 10 MG  tablet TAKE ONE (1) TABLET EACH DAY 30 tablet 11  . APRISO 0.375 G 24 hr capsule TAKE FOUR CAPSULES BY MOUTH ONCE DAILY 120 capsule 5  . BENICAR 40 MG tablet TAKE ONE (1) TABLET EACH DAY 30 tablet 2  . carvedilol (COREG) 12.5 MG tablet TAKE ONE TABLET MID DAY. TAKING 25MG ALSO. 30 tablet 6  . clopidogrel (PLAVIX) 75 MG tablet TAKE ONE (1) TABLET EACH DAY 30 tablet 2  . fish oil-omega-3 fatty acids 1000 MG capsule Take 2 g by mouth daily.     . furosemide (LASIX) 40 MG tablet TAKE ONE (1) TABLET EACH DAY 30 tablet 11  . glipiZIDE (GLUCOTROL) 10 MG tablet Take 1 tablet (10 mg total) by mouth 2 (two) times daily before a meal. 180 tablet 3  . Glucosamine-Chondroitin (OSTEO BI-FLEX REGULAR STRENGTH) 250-200 MG TABS Take 1 tablet by mouth 2 (two) times daily.     Marland Kitchen JANUVIA 100 MG tablet TAKE ONE (1) TABLET EACH DAY 30 tablet 3  . metFORMIN (GLUCOPHAGE) 1000 MG tablet TAKE 1 TABLET BY MOUTH TWICE A DAY WITH A MEAL 180 tablet 3  . ONE TOUCH ULTRA TEST test strip 1 each by Other route daily.     . potassium chloride SA (K-DUR,KLOR-CON) 20 MEQ tablet TAKE ONE TABLET ONCE DAILY 90 tablet 3  . PRADAXA 150 MG CAPS capsule TAKE ONE (1) CAPSULE BY MOUTH TWICE A DAY. (EVERY 12 HOURS.) 60 capsule 11  . STUDY MEDICATION REVEAL STUDY    . SYNTHROID 50 MCG tablet Take 1 tablet (50 mcg total) by mouth daily before breakfast. 90 tablet 0  No current facility-administered medications for this visit.    Objective: BP 130/72 mmHg  Pulse 78  Temp(Src) 97.9 F (36.6 C)  Wt 299 lb (135.626 kg) Gen: NAD, resting comfortably CV: RRR no murmurs rubs or gallops Lungs: CTAB no crackles, wheeze, rhonchi Abdomen: soft/nontender/nondistended/normal bowel sounds. No rebound or guarding.  Ext: 1+ edema bilaterally > on left ankle (chronically)  Skin: warm, dry, no rash, some venous stasis changes on legs Neuro: grossly normal, moves all extremities  DM foot exam largely  normal  Assessment/Plan:  Hypothyroidism Well-controlled on Synthroid 50 mcg. Refill 1 year. Recheck every 6-12 months  Essential hypertension Well-controlled. Continue amlodipine, Benicar, carvedilol, Lasix  Hyperlipidemia Doing well. LDL very low and 9. Total cholesterol less than 100. On study medication. No obvious role for statin at this point.   Six-month follow-up  Meds ordered this encounter  Medications  . SYNTHROID 50 MCG tablet    Sig: Take 1 tablet (50 mcg total) by mouth daily before breakfast.    Dispense:  90 tablet    Refill:  3

## 2014-08-11 NOTE — Assessment & Plan Note (Signed)
Well-controlled. Continue amlodipine, Benicar, carvedilol, Lasix

## 2014-08-11 NOTE — Assessment & Plan Note (Signed)
Well-controlled on Synthroid 50 mcg. Refill 1 year. Recheck every 6-12 months

## 2014-08-11 NOTE — Patient Instructions (Addendum)
Thyroid medicine refilled. Looked great.   Blood pressure looks great.   Cholesterol looks great.   No changes.   Health Maintenance Due  Topic Date Due  . FOOT EXAM - normal today 01/21/1956  . OPHTHALMOLOGY EXAM - in October. If we don't have the records by next visit then we can call or you can call now.  10/10/2013

## 2014-08-11 NOTE — Assessment & Plan Note (Signed)
Doing well. LDL very low and 9. Total cholesterol less than 100. On study medication. No obvious role for statin at this point.

## 2014-08-13 ENCOUNTER — Encounter (HOSPITAL_COMMUNITY): Payer: Self-pay

## 2014-08-15 ENCOUNTER — Encounter (HOSPITAL_COMMUNITY)
Admission: RE | Admit: 2014-08-15 | Discharge: 2014-08-15 | Disposition: A | Discharge status: Home / Self Care | Payer: Self-pay | Source: Ambulatory Visit | Attending: Cardiovascular Disease | Admitting: Cardiovascular Disease

## 2014-08-18 ENCOUNTER — Encounter (HOSPITAL_COMMUNITY): Payer: Self-pay

## 2014-08-20 ENCOUNTER — Encounter (HOSPITAL_COMMUNITY): Payer: Self-pay

## 2014-08-22 ENCOUNTER — Encounter (HOSPITAL_COMMUNITY): Payer: Self-pay

## 2014-08-24 ENCOUNTER — Other Ambulatory Visit: Payer: Self-pay | Admitting: Internal Medicine

## 2014-08-25 ENCOUNTER — Encounter (HOSPITAL_COMMUNITY)
Admission: RE | Admit: 2014-08-25 | Discharge: 2014-08-25 | Disposition: A | Discharge status: Home / Self Care | Payer: Self-pay | Source: Ambulatory Visit | Attending: Cardiovascular Disease | Admitting: Cardiovascular Disease

## 2014-08-27 ENCOUNTER — Encounter (HOSPITAL_COMMUNITY)
Admission: RE | Admit: 2014-08-27 | Discharge: 2014-08-27 | Disposition: A | Discharge status: Home / Self Care | Payer: Self-pay | Source: Ambulatory Visit | Attending: Cardiovascular Disease | Admitting: Cardiovascular Disease

## 2014-08-29 ENCOUNTER — Encounter (HOSPITAL_COMMUNITY)
Admission: RE | Admit: 2014-08-29 | Discharge: 2014-08-29 | Disposition: A | Discharge status: Home / Self Care | Payer: Self-pay | Source: Ambulatory Visit | Attending: Cardiovascular Disease | Admitting: Cardiovascular Disease

## 2014-09-01 ENCOUNTER — Encounter (HOSPITAL_COMMUNITY)
Admission: RE | Admit: 2014-09-01 | Discharge: 2014-09-01 | Disposition: A | Discharge status: Home / Self Care | Payer: Self-pay | Source: Ambulatory Visit | Attending: Cardiovascular Disease | Admitting: Cardiovascular Disease

## 2014-09-03 ENCOUNTER — Encounter (HOSPITAL_COMMUNITY): Payer: Self-pay

## 2014-09-05 ENCOUNTER — Encounter (HOSPITAL_COMMUNITY): Payer: Self-pay

## 2014-09-08 ENCOUNTER — Encounter (HOSPITAL_COMMUNITY)
Admission: RE | Admit: 2014-09-08 | Discharge: 2014-09-08 | Disposition: A | Discharge status: Home / Self Care | Payer: Self-pay | Source: Ambulatory Visit | Attending: Cardiovascular Disease | Admitting: Cardiovascular Disease

## 2014-09-10 ENCOUNTER — Encounter (HOSPITAL_COMMUNITY)
Admission: RE | Admit: 2014-09-10 | Discharge: 2014-09-10 | Disposition: A | Discharge status: Home / Self Care | Payer: Self-pay | Source: Ambulatory Visit | Attending: Cardiovascular Disease | Admitting: Cardiovascular Disease

## 2014-09-10 DIAGNOSIS — I252 Old myocardial infarction: Secondary | ICD-10-CM | POA: Insufficient documentation

## 2014-09-10 DIAGNOSIS — I10 Essential (primary) hypertension: Secondary | ICD-10-CM | POA: Insufficient documentation

## 2014-09-10 DIAGNOSIS — I2510 Atherosclerotic heart disease of native coronary artery without angina pectoris: Secondary | ICD-10-CM | POA: Insufficient documentation

## 2014-09-10 DIAGNOSIS — E119 Type 2 diabetes mellitus without complications: Secondary | ICD-10-CM | POA: Insufficient documentation

## 2014-09-10 DIAGNOSIS — Z9861 Coronary angioplasty status: Secondary | ICD-10-CM | POA: Insufficient documentation

## 2014-09-10 DIAGNOSIS — Z5189 Encounter for other specified aftercare: Secondary | ICD-10-CM | POA: Insufficient documentation

## 2014-09-12 ENCOUNTER — Encounter (HOSPITAL_COMMUNITY): Payer: Self-pay

## 2014-09-15 ENCOUNTER — Encounter (HOSPITAL_COMMUNITY): Payer: Medicare Other

## 2014-09-17 ENCOUNTER — Encounter (HOSPITAL_COMMUNITY): Payer: Self-pay

## 2014-09-19 ENCOUNTER — Encounter (HOSPITAL_COMMUNITY): Payer: Self-pay

## 2014-09-22 ENCOUNTER — Encounter (HOSPITAL_COMMUNITY)
Admission: RE | Admit: 2014-09-22 | Discharge: 2014-09-22 | Disposition: A | Discharge status: Home / Self Care | Payer: Self-pay | Source: Ambulatory Visit | Attending: Cardiovascular Disease | Admitting: Cardiovascular Disease

## 2014-09-24 ENCOUNTER — Encounter (HOSPITAL_COMMUNITY)
Admission: RE | Admit: 2014-09-24 | Discharge: 2014-09-24 | Disposition: A | Discharge status: Home / Self Care | Payer: Self-pay | Source: Ambulatory Visit | Attending: Cardiovascular Disease | Admitting: Cardiovascular Disease

## 2014-09-26 ENCOUNTER — Encounter (HOSPITAL_COMMUNITY)
Admission: RE | Admit: 2014-09-26 | Discharge: 2014-09-26 | Disposition: A | Discharge status: Home / Self Care | Payer: Self-pay | Source: Ambulatory Visit | Attending: Cardiovascular Disease | Admitting: Cardiovascular Disease

## 2014-09-29 ENCOUNTER — Encounter (HOSPITAL_COMMUNITY)
Admission: RE | Admit: 2014-09-29 | Discharge: 2014-09-29 | Disposition: A | Discharge status: Home / Self Care | Payer: Self-pay | Source: Ambulatory Visit | Attending: Cardiovascular Disease | Admitting: Cardiovascular Disease

## 2014-10-01 ENCOUNTER — Encounter (HOSPITAL_COMMUNITY)
Admission: RE | Admit: 2014-10-01 | Discharge: 2014-10-01 | Disposition: A | Discharge status: Home / Self Care | Payer: Self-pay | Source: Ambulatory Visit | Attending: Cardiovascular Disease | Admitting: Cardiovascular Disease

## 2014-10-03 ENCOUNTER — Encounter (HOSPITAL_COMMUNITY): Payer: Self-pay

## 2014-10-06 ENCOUNTER — Encounter (HOSPITAL_COMMUNITY): Payer: Self-pay

## 2014-10-08 ENCOUNTER — Encounter (HOSPITAL_COMMUNITY)
Admission: RE | Admit: 2014-10-08 | Discharge: 2014-10-08 | Disposition: A | Discharge status: Home / Self Care | Payer: Self-pay | Source: Ambulatory Visit | Attending: Cardiovascular Disease | Admitting: Cardiovascular Disease

## 2014-10-10 ENCOUNTER — Encounter (HOSPITAL_COMMUNITY): Payer: Self-pay

## 2014-10-13 ENCOUNTER — Encounter (HOSPITAL_COMMUNITY)
Admission: RE | Admit: 2014-10-13 | Discharge: 2014-10-13 | Disposition: A | Discharge status: Home / Self Care | Payer: Self-pay | Source: Ambulatory Visit | Attending: Cardiovascular Disease | Admitting: Cardiovascular Disease

## 2014-10-13 DIAGNOSIS — Z9861 Coronary angioplasty status: Secondary | ICD-10-CM | POA: Insufficient documentation

## 2014-10-13 DIAGNOSIS — I2510 Atherosclerotic heart disease of native coronary artery without angina pectoris: Secondary | ICD-10-CM | POA: Insufficient documentation

## 2014-10-13 DIAGNOSIS — I10 Essential (primary) hypertension: Secondary | ICD-10-CM | POA: Insufficient documentation

## 2014-10-13 DIAGNOSIS — Z5189 Encounter for other specified aftercare: Secondary | ICD-10-CM | POA: Insufficient documentation

## 2014-10-13 DIAGNOSIS — I252 Old myocardial infarction: Secondary | ICD-10-CM | POA: Insufficient documentation

## 2014-10-13 DIAGNOSIS — E119 Type 2 diabetes mellitus without complications: Secondary | ICD-10-CM | POA: Insufficient documentation

## 2014-10-15 ENCOUNTER — Encounter: Payer: Self-pay | Admitting: Internal Medicine

## 2014-10-15 ENCOUNTER — Encounter (HOSPITAL_COMMUNITY): Payer: Self-pay

## 2014-10-15 ENCOUNTER — Ambulatory Visit (INDEPENDENT_AMBULATORY_CARE_PROVIDER_SITE_OTHER): Payer: Medicare Other | Admitting: Internal Medicine

## 2014-10-15 VITALS — BP 122/80 | HR 92 | Temp 97.6°F | Resp 12 | Wt 302.0 lb

## 2014-10-15 DIAGNOSIS — E1159 Type 2 diabetes mellitus with other circulatory complications: Secondary | ICD-10-CM | POA: Diagnosis not present

## 2014-10-15 DIAGNOSIS — E1151 Type 2 diabetes mellitus with diabetic peripheral angiopathy without gangrene: Secondary | ICD-10-CM

## 2014-10-15 LAB — HEMOGLOBIN A1C: Hgb A1c MFr Bld: 8.7 % — ABNORMAL HIGH (ref 4.6–6.5)

## 2014-10-15 NOTE — Patient Instructions (Signed)
-   Please continue Glipizide 10 mg 2x a day - Continue Metformin 1000 mg 2x a day - Continue Januvia 100 mg daily  Please stop at the lab. Please return in 3 months with your sugar log.

## 2014-10-15 NOTE — Progress Notes (Signed)
Patient ID: [name], male   DOB: [birth year], 69 y.o.   MRN: [id number]  HPI: [name] is a 69 y.o.-year-old male, returning for DM2, dx 2004, non-insulin-dependent, uncontrolled, with complications (CAD, s/p MI). Last visit 3 mo ago.  Last hemoglobin A1c was: Lab Results  Component Value Date   HGBA1C 7.7* 07/11/2014   HGBA1C 7.7* 04/07/2014   HGBA1C 8.1* 01/06/2014  He started to change his eating habits since 03/2013.   Pt is on a regimen of: - Glipizide 10 mg bid  - Metformin 1000 mg bid  - Januvia 100 mg daily  He does not miss doses.  Pt checks his sugars 2-3 (we reviewed his log) - they are at goal!  - am: 108-112 (around the Holidays 150-160 ocasionally) >> 105-136 >> 81-93 >> 93-112 >> 92-113 - 2h after breakfast: 169-193 >> n/c >> 163-281 >> 166-194 >> 116-159, 184 >> 109-139 - before lunch 144 >> 110-120s >> n/c  - before dinner: 124-162 >> 110-120s >> 85-172 >> 82-110 >> 91-122 >> 89-105 - 2h after dinner: 142 >> n/c >> 150-306 (200s mostly) >> 177-193 >> 119-156 >> 107-136 No lows; he has hypoglycemia awareness at mid60s.   His wife is a Radiographer, therapeutic >> he is still eating sweets occasionally.  He goes to [employer] place - 3x a week (MWF) - 1h/session, starting 8:30-9 am.  He works 2x a week. He is seeing the nutritionist at the Cardiac Rehab.  - no CKD, last BUN/creatinine:  Lab Results  Component Value Date   BUN 17 05/09/2014   CREATININE 1.0 05/09/2014  he is on Benicar 40. - last set of lipids: Lab Results  Component Value Date   CHOL 90 05/09/2014   HDL 59.50 05/09/2014   LDLCALC 9 05/09/2014   LDLDIRECT 12.8 04/02/2013   TRIG 109.0 05/09/2014   CHOLHDL 2 05/09/2014  He is on omega 3 fatty acids.  - last eye exam was in 07/2014. No DR. (Dr [name]) - no numbness and tingling in his feet.  He also has a history of hypothyroidism, hyperlipidemia, OSA-on CPAP, very compliant, A. Fib-on Plavix and pradaxa, IBD, GERD, vitamin B12  deficiency - on im B12, obesity.  ROS: Constitutional: no weight gain/loss, no fatigue, no subjective hyperthermia/hypothermia Eyes: no blurry vision, no xerophthalmia ENT: no sore throat, no nodules palpated in throat, no dysphagia/odynophagia, no hoarseness;  Cardiovascular: no CP/SOB/palpitations/leg swelling Respiratory: no cough/SOB Gastrointestinal: no N/V/D/C Musculoskeletal: no muscle/joint aches Skin: no rashes;   I reviewed pt's medications, allergies, PMH, social hx, family hx, and changes were documented in the history of present illness. Otherwise, unchanged from my initial visit note.  PE: BP 122/80 mmHg  Pulse 92  Temp(Src) 97.6 F (36.4 C) (Oral)  Resp 12  Wt 302 lb (136.986 kg)  SpO2 95% Wt Readings from Last 3 Encounters:  10/15/14 302 lb (136.986 kg)  08/11/14 299 lb (135.626 kg)  07/11/14 299 lb (135.626 kg)   Constitutional: obese, protuberant abdomen, in NAD Eyes: PERRLA, EOMI, no exophthalmos ENT: moist mucous membranes, no thyromegaly, no cervical lymphadenopathy Cardiovascular: irregularly irregular, regular rate, No MRG; + bilateral leg swelling (compression hoses) Respiratory: CTA B Gastrointestinal: abdomen soft, NT, ND, BS+ Musculoskeletal: no deformities, strength intact in all 4 Skin: moist, warm, no rashes  ASSESSMENT: 1. DM2, non-insulin-dependent, uncontrolled, with complications - CAD, s/p MI 10/2006 - s/p stent - sees [name]  PLAN:  1. Patient with long-standing DM2, on oral meds; with much improved lately  after changing his diet. Sugars all at goal! - I suggested to:  Patient Instructions  - Please continue Glipizide 10 mg 2x a day - Continue Metformin 1000 mg 2x a day - Continue Januvia 100 mg daily  Please stop at the lab. Please return in 3 months with your sugar log.  - given more sugar logs - will check HbA1c today and also a fructosamine as his sugars in the log he brings are lower than indicated by the HbA1c - UTD with  eye exams - had flu vaccine this season - Return to clinic in 3 mo with sugar log   Office Visit on 10/15/2014  Component Date Value Ref Range Status  . Hgb A1c MFr Bld 10/15/2014 8.7* 4.6 - 6.5 % Final   Glycemic Control Guidelines for People with Diabetes:Non Diabetic:  <6%Goal of Therapy: <7%Additional Action Suggested:  >8%   . Fructosamine 10/15/2014 327* 190 - 270 umol/L Final   The HbA1c calculated from fructosamine is 7.17%, which is much closer to his log values!

## 2014-10-17 ENCOUNTER — Encounter (HOSPITAL_COMMUNITY)
Admission: RE | Admit: 2014-10-17 | Discharge: 2014-10-17 | Disposition: A | Discharge status: Home / Self Care | Payer: Self-pay | Source: Ambulatory Visit | Attending: Cardiovascular Disease | Admitting: Cardiovascular Disease

## 2014-10-17 LAB — FRUCTOSAMINE: FRUCTOSAMINE: 327 umol/L — AB (ref 190–270)

## 2014-10-20 ENCOUNTER — Encounter (HOSPITAL_COMMUNITY)
Admission: RE | Admit: 2014-10-20 | Discharge: 2014-10-20 | Disposition: A | Discharge status: Home / Self Care | Payer: Self-pay | Source: Ambulatory Visit | Attending: Cardiovascular Disease | Admitting: Cardiovascular Disease

## 2014-10-22 ENCOUNTER — Encounter (HOSPITAL_COMMUNITY)
Admission: RE | Admit: 2014-10-22 | Discharge: 2014-10-22 | Disposition: A | Discharge status: Home / Self Care | Payer: Self-pay | Source: Ambulatory Visit | Attending: Cardiovascular Disease | Admitting: Cardiovascular Disease

## 2014-10-24 ENCOUNTER — Encounter (HOSPITAL_COMMUNITY): Payer: Self-pay

## 2014-10-25 ENCOUNTER — Other Ambulatory Visit: Payer: Self-pay | Admitting: Family Medicine

## 2014-10-27 ENCOUNTER — Encounter (HOSPITAL_COMMUNITY)
Admission: RE | Admit: 2014-10-27 | Discharge: 2014-10-27 | Disposition: A | Discharge status: Home / Self Care | Payer: Self-pay | Source: Ambulatory Visit | Attending: Cardiovascular Disease | Admitting: Cardiovascular Disease

## 2014-10-29 ENCOUNTER — Encounter (HOSPITAL_COMMUNITY)
Admission: RE | Admit: 2014-10-29 | Discharge: 2014-10-29 | Disposition: A | Discharge status: Home / Self Care | Payer: Self-pay | Source: Ambulatory Visit | Attending: Cardiovascular Disease | Admitting: Cardiovascular Disease

## 2014-10-31 ENCOUNTER — Encounter (HOSPITAL_COMMUNITY): Payer: Self-pay

## 2014-11-03 ENCOUNTER — Encounter (HOSPITAL_COMMUNITY): Payer: Self-pay

## 2014-11-05 ENCOUNTER — Encounter (HOSPITAL_COMMUNITY)
Admission: RE | Admit: 2014-11-05 | Discharge: 2014-11-05 | Disposition: A | Discharge status: Home / Self Care | Payer: Self-pay | Source: Ambulatory Visit | Attending: Cardiovascular Disease | Admitting: Cardiovascular Disease

## 2014-11-07 ENCOUNTER — Encounter (HOSPITAL_COMMUNITY)
Admission: RE | Admit: 2014-11-07 | Discharge: 2014-11-07 | Disposition: A | Discharge status: Home / Self Care | Payer: Self-pay | Source: Ambulatory Visit | Attending: Cardiovascular Disease | Admitting: Cardiovascular Disease

## 2014-11-10 ENCOUNTER — Encounter (HOSPITAL_COMMUNITY)
Admission: RE | Admit: 2014-11-10 | Discharge: 2014-11-10 | Disposition: A | Discharge status: Home / Self Care | Payer: Self-pay | Source: Ambulatory Visit | Attending: Cardiovascular Disease | Admitting: Cardiovascular Disease

## 2014-11-10 DIAGNOSIS — Z9861 Coronary angioplasty status: Secondary | ICD-10-CM | POA: Insufficient documentation

## 2014-11-10 DIAGNOSIS — I10 Essential (primary) hypertension: Secondary | ICD-10-CM | POA: Insufficient documentation

## 2014-11-10 DIAGNOSIS — Z5189 Encounter for other specified aftercare: Secondary | ICD-10-CM | POA: Insufficient documentation

## 2014-11-10 DIAGNOSIS — I2510 Atherosclerotic heart disease of native coronary artery without angina pectoris: Secondary | ICD-10-CM | POA: Insufficient documentation

## 2014-11-10 DIAGNOSIS — E119 Type 2 diabetes mellitus without complications: Secondary | ICD-10-CM | POA: Insufficient documentation

## 2014-11-10 DIAGNOSIS — I252 Old myocardial infarction: Secondary | ICD-10-CM | POA: Insufficient documentation

## 2014-11-12 ENCOUNTER — Encounter (HOSPITAL_COMMUNITY): Payer: Self-pay

## 2014-11-14 ENCOUNTER — Encounter (HOSPITAL_COMMUNITY): Payer: Self-pay

## 2014-11-16 ENCOUNTER — Other Ambulatory Visit: Payer: Self-pay | Admitting: Gastroenterology

## 2014-11-17 ENCOUNTER — Encounter (HOSPITAL_COMMUNITY)
Admission: RE | Admit: 2014-11-17 | Discharge: 2014-11-17 | Disposition: A | Discharge status: Home / Self Care | Payer: Self-pay | Source: Ambulatory Visit | Attending: Cardiovascular Disease | Admitting: Cardiovascular Disease

## 2014-11-17 ENCOUNTER — Telehealth: Payer: Self-pay

## 2014-11-17 NOTE — Telephone Encounter (Signed)
Left a message with wife for patient to call me back.

## 2014-11-17 NOTE — Telephone Encounter (Signed)
-----   Message from Ladene Artist, MD sent at 11/17/2014 10:16 AM EST ----- [facility] office appt. Can give enough refills to get to office appt.   ----- Message -----    From: Marzella Schlein, CMA    Sent: 11/17/2014   9:24 AM      To: Ladene Artist, MD  This is patient that last saw Dr. Sharlett Iles on 10/18/13. Cannot see that you have seen patient but a refill for Apriso was sent to his pharmacy by you in 05/2014. I have a automatic request from pharmacy for [place] in my box. Can patient have a refill or do they need an appt with you?

## 2014-11-17 NOTE — Telephone Encounter (Signed)
Appt scheduled for 12/10/14 at 3:45pm and refill sent to patient's pharmacy.

## 2014-11-19 ENCOUNTER — Encounter (HOSPITAL_COMMUNITY)
Admission: RE | Admit: 2014-11-19 | Discharge: 2014-11-19 | Disposition: A | Discharge status: Home / Self Care | Payer: Self-pay | Source: Ambulatory Visit | Attending: Cardiovascular Disease | Admitting: Cardiovascular Disease

## 2014-11-21 ENCOUNTER — Encounter (HOSPITAL_COMMUNITY)
Admission: RE | Admit: 2014-11-21 | Discharge: 2014-11-21 | Disposition: A | Discharge status: Home / Self Care | Payer: Self-pay | Source: Ambulatory Visit | Attending: Cardiovascular Disease | Admitting: Cardiovascular Disease

## 2014-11-21 NOTE — Telephone Encounter (Signed)
error 

## 2014-11-24 ENCOUNTER — Encounter (HOSPITAL_COMMUNITY): Payer: Self-pay

## 2014-11-26 ENCOUNTER — Encounter (HOSPITAL_COMMUNITY): Payer: Self-pay

## 2014-11-26 ENCOUNTER — Other Ambulatory Visit: Payer: Self-pay | Admitting: Internal Medicine

## 2014-11-28 ENCOUNTER — Encounter (HOSPITAL_COMMUNITY): Payer: Self-pay

## 2014-12-01 ENCOUNTER — Encounter (HOSPITAL_COMMUNITY)
Admission: RE | Admit: 2014-12-01 | Discharge: 2014-12-01 | Disposition: A | Discharge status: Home / Self Care | Payer: Medicare Other | Source: Ambulatory Visit | Attending: Cardiovascular Disease | Admitting: Cardiovascular Disease

## 2014-12-03 ENCOUNTER — Encounter (HOSPITAL_COMMUNITY)
Admission: RE | Admit: 2014-12-03 | Discharge: 2014-12-03 | Disposition: A | Discharge status: Home / Self Care | Payer: Self-pay | Source: Ambulatory Visit | Attending: Cardiovascular Disease | Admitting: Cardiovascular Disease

## 2014-12-05 ENCOUNTER — Encounter (HOSPITAL_COMMUNITY)
Admission: RE | Admit: 2014-12-05 | Discharge: 2014-12-05 | Disposition: A | Discharge status: Home / Self Care | Payer: Self-pay | Source: Ambulatory Visit | Attending: Cardiovascular Disease | Admitting: Cardiovascular Disease

## 2014-12-08 ENCOUNTER — Encounter (HOSPITAL_COMMUNITY)
Admission: RE | Admit: 2014-12-08 | Discharge: 2014-12-08 | Disposition: A | Discharge status: Home / Self Care | Payer: Self-pay | Source: Ambulatory Visit | Attending: Cardiovascular Disease | Admitting: Cardiovascular Disease

## 2014-12-10 ENCOUNTER — Ambulatory Visit (INDEPENDENT_AMBULATORY_CARE_PROVIDER_SITE_OTHER): Payer: Medicare Other | Admitting: Gastroenterology

## 2014-12-10 ENCOUNTER — Encounter (HOSPITAL_COMMUNITY)
Admission: RE | Admit: 2014-12-10 | Discharge: 2014-12-10 | Disposition: A | Discharge status: Home / Self Care | Payer: Self-pay | Source: Ambulatory Visit | Attending: Cardiovascular Disease | Admitting: Cardiovascular Disease

## 2014-12-10 ENCOUNTER — Encounter: Payer: Self-pay | Admitting: Gastroenterology

## 2014-12-10 VITALS — BP 132/80 | HR 80 | Ht 68.0 in | Wt 301.4 lb

## 2014-12-10 DIAGNOSIS — Z9861 Coronary angioplasty status: Secondary | ICD-10-CM | POA: Insufficient documentation

## 2014-12-10 DIAGNOSIS — E119 Type 2 diabetes mellitus without complications: Secondary | ICD-10-CM | POA: Insufficient documentation

## 2014-12-10 DIAGNOSIS — I2510 Atherosclerotic heart disease of native coronary artery without angina pectoris: Secondary | ICD-10-CM | POA: Insufficient documentation

## 2014-12-10 DIAGNOSIS — K5190 Ulcerative colitis, unspecified, without complications: Secondary | ICD-10-CM | POA: Diagnosis not present

## 2014-12-10 DIAGNOSIS — I10 Essential (primary) hypertension: Secondary | ICD-10-CM | POA: Insufficient documentation

## 2014-12-10 DIAGNOSIS — I252 Old myocardial infarction: Secondary | ICD-10-CM | POA: Insufficient documentation

## 2014-12-10 DIAGNOSIS — Z5189 Encounter for other specified aftercare: Secondary | ICD-10-CM | POA: Insufficient documentation

## 2014-12-10 MED ORDER — MESALAMINE ER 0.375 G PO CP24: ORAL_CAPSULE | ORAL | Status: DC

## 2014-12-10 NOTE — Patient Instructions (Signed)
We have sent the following medications to your pharmacy for you to pick up at your convenience:Apriso.  Thank you for choosing me and [facility].  Pricilla Riffle. Dagoberto Ligas., MD., Marval Regal

## 2014-12-10 NOTE — Progress Notes (Addendum)
History of Present Illness: This is a 69 year old male returning for follow-up of ulcerative colitis. He was previously followed by Dr. Verl Blalock. He has no colorectal complaints. His colitis has been felt to be in remission. His last colonoscopy was in 11/2010. I have to showed mild chronic active colitis in the right and left colon. No dysplasia. Denies weight loss, abdominal pain, constipation, diarrhea, change in stool caliber, melena, hematochezia, nausea, vomiting, dysphagia, reflux symptoms, chest pain.   No Known Allergies Outpatient Prescriptions Prior to Visit  Medication Sig Dispense Refill  . amLODipine (NORVASC) 10 MG tablet TAKE ONE (1) TABLET EACH DAY 30 tablet 11  . BENICAR 40 MG tablet TAKE ONE (1) TABLET EACH DAY 30 tablet 2  . carvedilol (COREG) 12.5 MG tablet TAKE ONE TABLET MID DAY. TAKING 25MG ALSO. 30 tablet 6  . clopidogrel (PLAVIX) 75 MG tablet TAKE ONE (1) TABLET EACH DAY 30 tablet 2  . fish oil-omega-3 fatty acids 1000 MG capsule Take 2 g by mouth daily.     . furosemide (LASIX) 40 MG tablet TAKE ONE (1) TABLET EACH DAY 30 tablet 11  . glipiZIDE (GLUCOTROL) 10 MG tablet Take 1 tablet (10 mg total) by mouth 2 (two) times daily before a meal. 180 tablet 3  . Glucosamine-Chondroitin (OSTEO BI-FLEX REGULAR STRENGTH) 250-200 MG TABS Take 1 tablet by mouth 2 (two) times daily.     Marland Kitchen JANUVIA 100 MG tablet TAKE ONE (1) TABLET EACH DAY 30 tablet 2  . metFORMIN (GLUCOPHAGE) 1000 MG tablet TAKE 1 TABLET BY MOUTH TWICE A DAY WITH A MEAL 180 tablet 3  . ONE TOUCH ULTRA TEST test strip 1 each by Other route daily.     . potassium chloride SA (K-DUR,KLOR-CON) 20 MEQ tablet TAKE ONE TABLET ONCE DAILY 90 tablet 3  . PRADAXA 150 MG CAPS capsule TAKE ONE (1) CAPSULE BY MOUTH TWICE A DAY. (EVERY 12 HOURS.) 60 capsule 11  . STUDY MEDICATION REVEAL STUDY    . SYNTHROID 50 MCG tablet Take 1 tablet (50 mcg total) by mouth daily before breakfast. 90 tablet 3  . APRISO 0.375 G 24 hr  capsule TAKE 4 CAPSULES ONCE DAILY 120 capsule 1   No facility-administered medications prior to visit.   Past Medical History  Diagnosis Date  . CORONARY ARTERY DISEASE 04/12/2007    2 stents last in 2006.   Marland Kitchen DIABETES MELLITUS, TYPE II 04/16/2007  . HYPERLIPIDEMIA 04/16/2007    reveal study. not sure of medication  . HYPERTENSION 04/12/2007  . HYPOTHYROIDISM 04/12/2007  . MYOCARDIAL INFARCTION, HX OF 04/12/2007  . OBESITY 06/16/2009  . SLEEP APNEA 04/12/2007    CPAP  . ULCERATIVE COLITIS, LEFT SIDED 11/23/2010  . Ischemic cardiomyopathy     cath 35-40%  . Atrial fibrillation     initial diagnoses 2012  . B12 deficiency     per patient previously taking shots  . Diverticulosis of colon (without mention of hemorrhage)    Past Surgical History  Procedure Laterality Date  . Coronary stent placement  2008    LAD   . Cardiac catheterization  10/2002    STENT. 2 stents Dr. Maurene Capes  . Tonsillectomy     History   Social History  . Marital Status: Married    Spouse Name: N/A  . Number of Children: 1  . Years of Education: N/A   Occupational History  . RETIRED    Social History Main Topics  . Smoking status: Former Smoker --  1.00 packs/day for 5 years    Types: Cigarettes    Quit date: 04/14/1967  . Smokeless tobacco: Never Used  . Alcohol Use: Yes     Comment: social, beer once a month or two - light  . Drug Use: No  . Sexual Activity: Not on file   Other Topics Concern  . None   Social History Narrative   Cardiorehab 3 days a week. 45 minutes to an hour-stationary bike.    GRANDDAUGHTER (MS. 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       Married for 37 years in 2015, married previously for 14 years. Daughter with first wife and 3 grandkids.    Lives alone with wife. Get to see grandkids a lot. New grandchild in middle of 77      Hobbies-yardwork, previously liked to hunt and  fish, does some target shooting   Family History  Problem Relation Age of Onset  . Heart attack Mother     mid 55s  . Heart disease Father     H/O CAD, CABG, VALVE SURGERY  . Hypertension Brother   . Obesity Brother   . Stomach cancer Maternal Aunt   . Stomach cancer Paternal Grandmother     ? colon     Physical Exam: General: Well developed , well nourished, obese, no acute distress Head: Normocephalic and atraumatic Eyes:  sclerae anicteric, EOMI Ears: Normal auditory acuity Mouth: No deformity or lesions Lungs: Clear throughout to auscultation Heart: Regular rate and rhythm; no murmurs, rubs or bruits Abdomen: Soft, non tender and non distended. No masses, hepatosplenomegaly or hernias noted. Normal Bowel sounds Musculoskeletal: Symmetrical with no gross deformities  Pulses:  Normal pulses noted Extremities: No clubbing, cyanosis, edema or deformities noted Neurological: Alert oriented x 4, grossly nonfocal Psychological:  Alert and cooperative. Normal mood and affect  Assessment and Recommendations:  1. Ulcerative pancolitis. Asymptomatic. Mild chronic colitis on last biopsies. He has been felt to be in remission. Renew Apriso. Dr. Sharlett Iles recommended a 5 year interval surveillance colonoscopy in Februrary 2017. He is maintained on Pradaxa and Plavix for afib and CAD. He had several questions/concerns regarding surveillance colonoscopy and management of UC Especially in relationship to his cardiac history. Spent over 15 minutes with the patient and over 50% of the time was counseling the patient.

## 2014-12-12 ENCOUNTER — Encounter (HOSPITAL_COMMUNITY)
Admission: RE | Admit: 2014-12-12 | Discharge: 2014-12-12 | Disposition: A | Discharge status: Home / Self Care | Payer: Self-pay | Source: Ambulatory Visit | Attending: Cardiovascular Disease | Admitting: Cardiovascular Disease

## 2014-12-15 ENCOUNTER — Encounter (HOSPITAL_COMMUNITY)
Admission: RE | Admit: 2014-12-15 | Discharge: 2014-12-15 | Disposition: A | Discharge status: Home / Self Care | Payer: Self-pay | Source: Ambulatory Visit | Attending: Cardiovascular Disease | Admitting: Cardiovascular Disease

## 2014-12-17 ENCOUNTER — Encounter (HOSPITAL_COMMUNITY)
Admission: RE | Admit: 2014-12-17 | Discharge: 2014-12-17 | Disposition: A | Discharge status: Home / Self Care | Payer: Self-pay | Source: Ambulatory Visit | Attending: Cardiovascular Disease | Admitting: Cardiovascular Disease

## 2014-12-19 ENCOUNTER — Encounter (HOSPITAL_COMMUNITY)
Admission: RE | Admit: 2014-12-19 | Discharge: 2014-12-19 | Disposition: A | Discharge status: Home / Self Care | Payer: Self-pay | Source: Ambulatory Visit | Attending: Cardiovascular Disease | Admitting: Cardiovascular Disease

## 2014-12-22 ENCOUNTER — Encounter (HOSPITAL_COMMUNITY)
Admission: RE | Admit: 2014-12-22 | Discharge: 2014-12-22 | Disposition: A | Discharge status: Home / Self Care | Payer: Self-pay | Source: Ambulatory Visit | Attending: Cardiovascular Disease | Admitting: Cardiovascular Disease

## 2014-12-23 ENCOUNTER — Other Ambulatory Visit: Payer: Self-pay | Admitting: Internal Medicine

## 2014-12-24 ENCOUNTER — Encounter (HOSPITAL_COMMUNITY)
Admission: RE | Admit: 2014-12-24 | Discharge: 2014-12-24 | Disposition: A | Discharge status: Home / Self Care | Payer: Self-pay | Source: Ambulatory Visit | Attending: Cardiovascular Disease | Admitting: Cardiovascular Disease

## 2014-12-26 ENCOUNTER — Encounter (HOSPITAL_COMMUNITY): Payer: Self-pay

## 2014-12-29 ENCOUNTER — Encounter (HOSPITAL_COMMUNITY)
Admission: RE | Admit: 2014-12-29 | Discharge: 2014-12-29 | Disposition: A | Discharge status: Home / Self Care | Payer: Self-pay | Source: Ambulatory Visit | Attending: Cardiovascular Disease | Admitting: Cardiovascular Disease

## 2014-12-31 ENCOUNTER — Encounter (HOSPITAL_COMMUNITY)
Admission: RE | Admit: 2014-12-31 | Discharge: 2014-12-31 | Disposition: A | Discharge status: Home / Self Care | Payer: Self-pay | Source: Ambulatory Visit | Attending: Cardiovascular Disease | Admitting: Cardiovascular Disease

## 2015-01-02 ENCOUNTER — Encounter (HOSPITAL_COMMUNITY)
Admission: RE | Admit: 2015-01-02 | Discharge: 2015-01-02 | Disposition: A | Discharge status: Home / Self Care | Payer: Self-pay | Source: Ambulatory Visit | Attending: Cardiovascular Disease | Admitting: Cardiovascular Disease

## 2015-01-05 ENCOUNTER — Encounter (HOSPITAL_COMMUNITY)
Admission: RE | Admit: 2015-01-05 | Discharge: 2015-01-05 | Disposition: A | Discharge status: Home / Self Care | Payer: Self-pay | Source: Ambulatory Visit | Attending: Cardiovascular Disease | Admitting: Cardiovascular Disease

## 2015-01-07 ENCOUNTER — Encounter (HOSPITAL_COMMUNITY): Payer: Self-pay

## 2015-01-08 NOTE — Telephone Encounter (Signed)
error 

## 2015-01-09 ENCOUNTER — Encounter (HOSPITAL_COMMUNITY)
Admission: RE | Admit: 2015-01-09 | Discharge: 2015-01-09 | Disposition: A | Discharge status: Home / Self Care | Payer: Self-pay | Source: Ambulatory Visit | Attending: Cardiovascular Disease | Admitting: Cardiovascular Disease

## 2015-01-09 DIAGNOSIS — Z9861 Coronary angioplasty status: Secondary | ICD-10-CM | POA: Insufficient documentation

## 2015-01-09 DIAGNOSIS — I2510 Atherosclerotic heart disease of native coronary artery without angina pectoris: Secondary | ICD-10-CM | POA: Insufficient documentation

## 2015-01-09 DIAGNOSIS — E119 Type 2 diabetes mellitus without complications: Secondary | ICD-10-CM | POA: Insufficient documentation

## 2015-01-09 DIAGNOSIS — I10 Essential (primary) hypertension: Secondary | ICD-10-CM | POA: Insufficient documentation

## 2015-01-09 DIAGNOSIS — I252 Old myocardial infarction: Secondary | ICD-10-CM | POA: Insufficient documentation

## 2015-01-09 DIAGNOSIS — Z5189 Encounter for other specified aftercare: Secondary | ICD-10-CM | POA: Insufficient documentation

## 2015-01-12 ENCOUNTER — Encounter (HOSPITAL_COMMUNITY)
Admission: RE | Admit: 2015-01-12 | Discharge: 2015-01-12 | Disposition: A | Discharge status: Home / Self Care | Payer: Self-pay | Source: Ambulatory Visit | Attending: Cardiovascular Disease | Admitting: Cardiovascular Disease

## 2015-01-14 ENCOUNTER — Encounter (HOSPITAL_COMMUNITY)
Admission: RE | Admit: 2015-01-14 | Discharge: 2015-01-14 | Disposition: A | Discharge status: Home / Self Care | Payer: Self-pay | Source: Ambulatory Visit | Attending: Cardiovascular Disease | Admitting: Cardiovascular Disease

## 2015-01-16 ENCOUNTER — Encounter (HOSPITAL_COMMUNITY)
Admission: RE | Admit: 2015-01-16 | Discharge: 2015-01-16 | Disposition: A | Discharge status: Home / Self Care | Payer: Self-pay | Source: Ambulatory Visit | Attending: Cardiovascular Disease | Admitting: Cardiovascular Disease

## 2015-01-16 ENCOUNTER — Ambulatory Visit: Payer: Medicare Other | Admitting: Internal Medicine

## 2015-01-19 ENCOUNTER — Encounter (HOSPITAL_COMMUNITY): Payer: Self-pay

## 2015-01-21 ENCOUNTER — Encounter (HOSPITAL_COMMUNITY): Payer: Self-pay

## 2015-01-23 ENCOUNTER — Encounter (HOSPITAL_COMMUNITY): Payer: Self-pay

## 2015-01-24 ENCOUNTER — Other Ambulatory Visit: Payer: Self-pay | Admitting: Family Medicine

## 2015-01-26 ENCOUNTER — Encounter (HOSPITAL_COMMUNITY): Payer: Self-pay

## 2015-01-28 ENCOUNTER — Encounter (HOSPITAL_COMMUNITY)
Admission: RE | Admit: 2015-01-28 | Discharge: 2015-01-28 | Disposition: A | Discharge status: Home / Self Care | Payer: Self-pay | Source: Ambulatory Visit | Attending: Cardiovascular Disease | Admitting: Cardiovascular Disease

## 2015-01-30 ENCOUNTER — Encounter (HOSPITAL_COMMUNITY)
Admission: RE | Admit: 2015-01-30 | Discharge: 2015-01-30 | Disposition: A | Discharge status: Home / Self Care | Payer: Self-pay | Source: Ambulatory Visit | Attending: Cardiovascular Disease | Admitting: Cardiovascular Disease

## 2015-02-02 ENCOUNTER — Encounter (HOSPITAL_COMMUNITY)
Admission: RE | Admit: 2015-02-02 | Discharge: 2015-02-02 | Disposition: A | Discharge status: Home / Self Care | Payer: Self-pay | Source: Ambulatory Visit | Attending: Cardiovascular Disease | Admitting: Cardiovascular Disease

## 2015-02-04 ENCOUNTER — Encounter (HOSPITAL_COMMUNITY)
Admission: RE | Admit: 2015-02-04 | Discharge: 2015-02-04 | Disposition: A | Discharge status: Home / Self Care | Payer: Self-pay | Source: Ambulatory Visit | Attending: Cardiovascular Disease | Admitting: Cardiovascular Disease

## 2015-02-06 ENCOUNTER — Encounter (HOSPITAL_COMMUNITY)
Admission: RE | Admit: 2015-02-06 | Discharge: 2015-02-06 | Disposition: A | Discharge status: Home / Self Care | Payer: Self-pay | Source: Ambulatory Visit | Attending: Cardiovascular Disease | Admitting: Cardiovascular Disease

## 2015-02-09 ENCOUNTER — Encounter (HOSPITAL_COMMUNITY): Payer: Self-pay

## 2015-02-09 ENCOUNTER — Encounter: Payer: Self-pay | Admitting: Family Medicine

## 2015-02-09 ENCOUNTER — Ambulatory Visit (INDEPENDENT_AMBULATORY_CARE_PROVIDER_SITE_OTHER): Payer: Medicare Other | Admitting: Family Medicine

## 2015-02-09 VITALS — BP 138/84 | HR 88 | Temp 97.8°F | Wt 304.0 lb

## 2015-02-09 DIAGNOSIS — I10 Essential (primary) hypertension: Secondary | ICD-10-CM

## 2015-02-09 DIAGNOSIS — E785 Hyperlipidemia, unspecified: Secondary | ICD-10-CM | POA: Diagnosis not present

## 2015-02-09 DIAGNOSIS — Z23 Encounter for immunization: Secondary | ICD-10-CM | POA: Diagnosis not present

## 2015-02-09 DIAGNOSIS — M1712 Unilateral primary osteoarthritis, left knee: Secondary | ICD-10-CM | POA: Diagnosis not present

## 2015-02-09 MED ORDER — DICLOFENAC SODIUM 1 % TD GEL
2.0000 g | Freq: Four times a day (QID) | TRANSDERMAL | Status: DC | PRN
Start: 2015-02-09 — End: 2015-07-08

## 2015-02-09 NOTE — Assessment & Plan Note (Signed)
Trial voltaren gel. Avoid nsaids as on both pradaxa and plavix and high GI bleed risk unless un tolerable control on topical.

## 2015-02-09 NOTE — Patient Instructions (Addendum)
Have eye exam faxed to [place] at [phone].Sign release of information at the front desk for it as an alternative.   Received final pneumonia shot today ([id number]).   Overall things look ok. I would love for you to get down a few lbs.   Blood pressure looked ok on repeat.   Check in September and come in fasting and we will update bloodwork.

## 2015-02-09 NOTE — Progress Notes (Signed)
Garret Reddish, MD  Subjective:  [name] is a 69 y.o. year old very pleasant male patient who presents with:  Hypertension-controlled on repeat with initial SBP 150  BP Readings from Last 3 Encounters:  02/09/15 138/84  12/10/14 132/80  10/15/14 122/80   Home BP monitoring-usually 130s over 70s at cardiac rehab. Had a heavy biscuit this mornign with lots of salt.  Compliant with medications-yes without side effects ROS-Denies any CP, HA, SOB, blurry vision, worsening LE edema.   Arthritis of Left Knee -L knee arthroscopic "clean out" 1994, told needed knee replacement but cardiology has advised against. He would prefer a topical agent for treatment given on 2 blood thinners-plavix and pradaxa ROS- no hot, swollen joint  Hyperlipidemia-well controlled  Lab Results  Component Value Date   Lebo 9 05/09/2014   On statin: on study medication Regular exercise: no advised would benefit him but limited by knee Diet: overeating, discussed even a few lbs weight loss could help knee ROS- no chest pain or shortness of breath. No myalgias  Past Medical History- Type II DM, CAD, a fib, IBD, CHF class II ischemic, hypothyroidism, sleep apnea  Medications- reviewed and updated Current Outpatient Prescriptions  Medication Sig Dispense Refill  . amLODipine (NORVASC) 10 MG tablet TAKE ONE (1) TABLET EACH DAY 30 tablet 11  . BENICAR 40 MG tablet TAKE ONE (1) TABLET EACH DAY 30 tablet 5  . carvedilol (COREG) 12.5 MG tablet TAKE ONE TABLET MID DAY. TAKING 25MG ALSO. 30 tablet 6  . clopidogrel (PLAVIX) 75 MG tablet TAKE ONE (1) TABLET EACH DAY 30 tablet 5  . fish oil-omega-3 fatty acids 1000 MG capsule Take 2 g by mouth daily.     . furosemide (LASIX) 40 MG tablet TAKE ONE (1) TABLET EACH DAY 30 tablet 11  . glipiZIDE (GLUCOTROL) 10 MG tablet TAKE ONE TABLET TWICE DAILY 180 tablet 1  . Glucosamine-Chondroitin (OSTEO BI-FLEX REGULAR STRENGTH) 250-200 MG TABS Take 1 tablet by mouth 2 (two)  times daily.     Marland Kitchen JANUVIA 100 MG tablet TAKE ONE (1) TABLET EACH DAY 30 tablet 2  . mesalamine (APRISO) 0.375 G 24 hr capsule TAKE 4 CAPSULES ONCE DAILY 120 capsule 11  . metFORMIN (GLUCOPHAGE) 1000 MG tablet TAKE 1 TABLET BY MOUTH TWICE A DAY WITH A MEAL 180 tablet 3  . ONE TOUCH ULTRA TEST test strip 1 each by Other route daily.     . potassium chloride SA (K-DUR,KLOR-CON) 20 MEQ tablet TAKE ONE TABLET ONCE DAILY 90 tablet 3  . PRADAXA 150 MG CAPS capsule TAKE ONE (1) CAPSULE BY MOUTH TWICE A DAY. (EVERY 12 HOURS.) 60 capsule 11  . STUDY MEDICATION REVEAL STUDY    . SYNTHROID 50 MCG tablet Take 1 tablet (50 mcg total) by mouth daily before breakfast. 90 tablet 3   No current facility-administered medications for this visit.    Objective: BP 138/84 mmHg  Pulse 88  Temp(Src) 97.8 F (36.6 C)  Wt 304 lb (137.893 kg) Gen: NAD, resting comfortably CV: irregularly irregular no murmurs rubs or gallops Lungs: CTAB no crackles, wheeze, rhonchi Abdomen: soft/nontender/nondistended/normal bowel sounds. No rebound or guarding.  Ext: 1+ bilateral pitting edema Skin: warm, dry, no rash Neuro: grossly normal, moves all extremities, walks with slight limp   Assessment/Plan:  Osteoarthritis of left knee Trial voltaren gel. Avoid nsaids as on both pradaxa and plavix and high GI bleed risk unless un tolerable control on topical.    Essential hypertension Controlled on  Amlodipine 40m, benicar 46106m coreg 12.106m36mID on manual repeat   Hyperlipidemia Only has a few more visits for study medication. LDL at 9, continue study medication and no statin.    September visit with fasting labs on day of. Asked him to complete ROI for eye exam today.   Orders Placed This Encounter  Procedures  . Pneumococcal conjugate vaccine 13-valent   Meds ordered this encounter  Medications  . diclofenac sodium (VOLTAREN) 1 % GEL    Sig: Apply 2 g topically 4 (four) times daily as needed.    Dispense:  100  g    Refill:  5

## 2015-02-09 NOTE — Assessment & Plan Note (Signed)
Only has a few more visits for study medication. LDL at 9, continue study medication and no statin.

## 2015-02-09 NOTE — Assessment & Plan Note (Signed)
Controlled on Amlodipine 357m, benicar 463m coreg 12.57m76mID on manual repeat

## 2015-02-11 ENCOUNTER — Encounter (HOSPITAL_COMMUNITY): Payer: Self-pay

## 2015-02-13 ENCOUNTER — Encounter (HOSPITAL_COMMUNITY)
Admission: RE | Admit: 2015-02-13 | Discharge: 2015-02-13 | Disposition: A | Discharge status: Home / Self Care | Payer: Self-pay | Source: Ambulatory Visit | Attending: Cardiovascular Disease | Admitting: Cardiovascular Disease

## 2015-02-13 DIAGNOSIS — I10 Essential (primary) hypertension: Secondary | ICD-10-CM | POA: Insufficient documentation

## 2015-02-13 DIAGNOSIS — I2510 Atherosclerotic heart disease of native coronary artery without angina pectoris: Secondary | ICD-10-CM | POA: Insufficient documentation

## 2015-02-13 DIAGNOSIS — E119 Type 2 diabetes mellitus without complications: Secondary | ICD-10-CM | POA: Insufficient documentation

## 2015-02-13 DIAGNOSIS — I252 Old myocardial infarction: Secondary | ICD-10-CM | POA: Insufficient documentation

## 2015-02-13 DIAGNOSIS — Z5189 Encounter for other specified aftercare: Secondary | ICD-10-CM | POA: Insufficient documentation

## 2015-02-13 DIAGNOSIS — Z9861 Coronary angioplasty status: Secondary | ICD-10-CM | POA: Insufficient documentation

## 2015-02-16 ENCOUNTER — Encounter (HOSPITAL_COMMUNITY)
Admission: RE | Admit: 2015-02-16 | Discharge: 2015-02-16 | Disposition: A | Discharge status: Home / Self Care | Payer: Self-pay | Source: Ambulatory Visit | Attending: Cardiovascular Disease | Admitting: Cardiovascular Disease

## 2015-02-18 ENCOUNTER — Encounter: Payer: Self-pay | Admitting: Family Medicine

## 2015-02-18 ENCOUNTER — Encounter (HOSPITAL_COMMUNITY): Payer: Self-pay

## 2015-02-20 ENCOUNTER — Encounter (HOSPITAL_COMMUNITY)
Admission: RE | Admit: 2015-02-20 | Discharge: 2015-02-20 | Disposition: A | Discharge status: Home / Self Care | Payer: Self-pay | Source: Ambulatory Visit | Attending: Cardiovascular Disease | Admitting: Cardiovascular Disease

## 2015-02-23 ENCOUNTER — Encounter: Payer: Self-pay | Admitting: Internal Medicine

## 2015-02-23 ENCOUNTER — Encounter (HOSPITAL_COMMUNITY)
Admission: RE | Admit: 2015-02-23 | Discharge: 2015-02-23 | Disposition: A | Discharge status: Home / Self Care | Payer: Self-pay | Source: Ambulatory Visit | Attending: Cardiovascular Disease | Admitting: Cardiovascular Disease

## 2015-02-23 ENCOUNTER — Other Ambulatory Visit: Payer: Self-pay | Admitting: *Deleted

## 2015-02-23 ENCOUNTER — Ambulatory Visit (INDEPENDENT_AMBULATORY_CARE_PROVIDER_SITE_OTHER): Payer: Medicare Other | Admitting: Internal Medicine

## 2015-02-23 VITALS — BP 124/80 | HR 77 | Temp 97.4°F | Resp 12 | Wt 296.0 lb

## 2015-02-23 DIAGNOSIS — E1151 Type 2 diabetes mellitus with diabetic peripheral angiopathy without gangrene: Secondary | ICD-10-CM

## 2015-02-23 DIAGNOSIS — E1159 Type 2 diabetes mellitus with other circulatory complications: Secondary | ICD-10-CM | POA: Diagnosis not present

## 2015-02-23 MED ORDER — GLUCOSE BLOOD VI STRP: ORAL_STRIP | Status: DC

## 2015-02-23 NOTE — Patient Instructions (Signed)
-   Please continue Glipizide 10 mg 2x a day - Continue Metformin 1000 mg 2x a day - Continue Januvia 100 mg daily   Please stop at the lab.  Please return in 3 months with your sugar log.

## 2015-02-23 NOTE — Progress Notes (Signed)
Patient ID: [name], male   DOB: [birth year], 69 y.o.   MRN: [id number]  HPI: [name] is a 69 y.o.-year-old male, returning for DM2, dx 2004, non-insulin-dependent, uncontrolled, with complications (CAD, s/p MI). Last visit 4 mo ago.  Since last visit, he started to eat more fried food and icecream. He is now working with a Microbiologist at [employer]. Since this, he started to change his diet and lost 5 lbs. Sugars are better in last week, too.  Last hemoglobin A1c was: 10/15/2014: HbA1c calculated from fructosamine is 7.17% Lab Results  Component Value Date   HGBA1C 8.7* 10/15/2014   HGBA1C 7.7* 07/11/2014   HGBA1C 7.7* 04/07/2014  He started to change his eating habits since 03/2013.   Pt is on a regimen of: - Glipizide 10 mg bid  - Metformin 1000 mg bid  - Januvia 100 mg daily  He does not miss doses.  Pt checks his sugars 2-3 (we reviewed his log) - higher!  - am: 108-112 (around the Holidays 150-160 ocasionally) >> 105-136 >> 81-93 >> 93-112 >> 92-113 >> 143-167 - 2h after breakfast: 169-193 >> n/c >> 163-281 >> 166-194 >> 116-159, 184 >> 109-139 >> 151-174 - before lunch 144 >> 110-120s >> n/c  - before dinner: 124-162 >> 110-120s >> 85-172 >> 82-110 >> 91-122 >> 89-105 >> 139-183 - 2h after dinner: 142 >> n/c >> 150-306 (200s mostly) >> 177-193 >> 119-156 >> 107-136 >> 159-190 No lows; he has hypoglycemia awareness at mid60s.   His wife is a Radiographer, therapeutic >> he is still eating sweets occasionally.  He goes to [employer] place - 3x a week (MWF) - 1h/session, starting 8:30-9 am.  He works 2x a week. He is seeing the nutritionist at the Cardiac Rehab.  - no CKD, last BUN/creatinine:  Lab Results  Component Value Date   BUN 17 05/09/2014   CREATININE 1.0 05/09/2014  he is on Benicar 40. - last set of lipids: Lab Results  Component Value Date   CHOL 90 05/09/2014   HDL 59.50 05/09/2014   LDLCALC 9 05/09/2014   LDLDIRECT 12.8 04/02/2013   TRIG 109.0  05/09/2014   CHOLHDL 2 05/09/2014  He is on omega 3 fatty acids.  - last eye exam was (Dr [name]): Abstract on 02/18/2015  Component Date Value Ref Range Status  . HM Diabetic Eye Exam 07/16/2014 No Retinopathy  No Retinopathy Final   - no numbness and tingling in his feet.  He also has a history of hypothyroidism, hyperlipidemia, OSA-on CPAP, very compliant, A. Fib-on Plavix and pradaxa, IBD, GERD, vitamin B12 deficiency - on im B12, obesity.  ROS: Constitutional: no weight gain/loss, no fatigue, no subjective hyperthermia/hypothermia Eyes: no blurry vision, no xerophthalmia ENT: no sore throat, no nodules palpated in throat, no dysphagia/odynophagia, no hoarseness;  Cardiovascular: no CP/SOB/palpitations/leg swelling Respiratory: no cough/SOB Gastrointestinal: no N/V/D/C Musculoskeletal: no muscle/joint aches Skin: no rashes;   I reviewed pt's medications, allergies, PMH, social hx, family hx, and changes were documented in the history of present illness. Otherwise, unchanged from my initial visit note.  PE: BP 124/80 mmHg  Pulse 77  Temp(Src) 97.4 F (36.3 C) (Oral)  Resp 12  Wt 296 lb (134.265 kg)  SpO2 95% Wt Readings from Last 3 Encounters:  02/23/15 296 lb (134.265 kg)  02/09/15 304 lb (137.893 kg)  12/10/14 301 lb 6.4 oz (136.714 kg)   Constitutional: obese, protuberant abdomen, in NAD Eyes: PERRLA, EOMI, no exophthalmos ENT: moist  mucous membranes, no thyromegaly, no cervical lymphadenopathy Cardiovascular: irregularly irregular, regular rate, No MRG; + bilateral leg swelling (compression hoses) Respiratory: CTA B Gastrointestinal: abdomen soft, NT, ND, BS+ Musculoskeletal: no deformities, strength intact in all 4 Skin: moist, warm, no rashes  ASSESSMENT: 1. DM2, non-insulin-dependent, uncontrolled, with complications - CAD, s/p MI 10/2006 - s/p stent - sees [name]  PLAN:  1. Patient with long-standing DM2, now with worse control due to relaxing his  diet >> her got back to the previous diet >> sugars already better!  - I suggested to:  Patient Instructions  - Please continue Glipizide 10 mg 2x a day - Continue Metformin 1000 mg 2x a day - Continue Januvia 100 mg daily  Please stop at the lab. Please return in 3 months with your sugar log.  - given more sugar logs - will check fructosamine today as his sugars - UTD with eye exams - Return to clinic in 3 mo with sugar log   Office Visit on 02/23/2015  Component Date Value Ref Range Status  . Fructosamine 02/23/2015 310* 190 - 270 umol/L Final    HbA1c calculated from fructosamine is 6.88%! This is great!

## 2015-02-25 ENCOUNTER — Encounter (HOSPITAL_COMMUNITY)
Admission: RE | Admit: 2015-02-25 | Discharge: 2015-02-25 | Disposition: A | Discharge status: Home / Self Care | Payer: Self-pay | Source: Ambulatory Visit | Attending: Cardiovascular Disease | Admitting: Cardiovascular Disease

## 2015-02-25 ENCOUNTER — Other Ambulatory Visit: Payer: Self-pay | Admitting: Internal Medicine

## 2015-02-25 ENCOUNTER — Other Ambulatory Visit: Payer: Self-pay | Admitting: Cardiovascular Disease

## 2015-02-25 LAB — FRUCTOSAMINE: Fructosamine: 310 umol/L — ABNORMAL HIGH (ref 190–270)

## 2015-02-26 ENCOUNTER — Other Ambulatory Visit: Payer: Self-pay | Admitting: *Deleted

## 2015-02-26 MED ORDER — SITAGLIPTIN PHOSPHATE 100 MG PO TABS: ORAL_TABLET | ORAL | Status: DC

## 2015-02-26 MED ORDER — GLUCOSE BLOOD VI STRP: ORAL_STRIP | Status: DC

## 2015-02-27 ENCOUNTER — Encounter (HOSPITAL_COMMUNITY)
Admission: RE | Admit: 2015-02-27 | Discharge: 2015-02-27 | Disposition: A | Discharge status: Home / Self Care | Payer: Self-pay | Source: Ambulatory Visit | Attending: Cardiovascular Disease | Admitting: Cardiovascular Disease

## 2015-03-02 ENCOUNTER — Encounter (HOSPITAL_COMMUNITY)
Admission: RE | Admit: 2015-03-02 | Discharge: 2015-03-02 | Disposition: A | Discharge status: Home / Self Care | Payer: Self-pay | Source: Ambulatory Visit | Attending: Cardiovascular Disease | Admitting: Cardiovascular Disease

## 2015-03-04 ENCOUNTER — Encounter (HOSPITAL_COMMUNITY): Payer: Self-pay

## 2015-03-06 ENCOUNTER — Encounter (HOSPITAL_COMMUNITY): Payer: Self-pay

## 2015-03-09 ENCOUNTER — Encounter (HOSPITAL_COMMUNITY): Payer: Self-pay

## 2015-03-11 ENCOUNTER — Encounter (HOSPITAL_COMMUNITY)
Admission: RE | Admit: 2015-03-11 | Discharge: 2015-03-11 | Disposition: A | Discharge status: Home / Self Care | Payer: Self-pay | Source: Ambulatory Visit | Attending: Cardiovascular Disease | Admitting: Cardiovascular Disease

## 2015-03-11 DIAGNOSIS — I252 Old myocardial infarction: Secondary | ICD-10-CM | POA: Insufficient documentation

## 2015-03-11 DIAGNOSIS — I2510 Atherosclerotic heart disease of native coronary artery without angina pectoris: Secondary | ICD-10-CM | POA: Insufficient documentation

## 2015-03-11 DIAGNOSIS — E119 Type 2 diabetes mellitus without complications: Secondary | ICD-10-CM | POA: Insufficient documentation

## 2015-03-11 DIAGNOSIS — I10 Essential (primary) hypertension: Secondary | ICD-10-CM | POA: Insufficient documentation

## 2015-03-11 DIAGNOSIS — Z9861 Coronary angioplasty status: Secondary | ICD-10-CM | POA: Insufficient documentation

## 2015-03-11 DIAGNOSIS — Z5189 Encounter for other specified aftercare: Secondary | ICD-10-CM | POA: Insufficient documentation

## 2015-03-13 ENCOUNTER — Encounter (HOSPITAL_COMMUNITY)
Admission: RE | Admit: 2015-03-13 | Discharge: 2015-03-13 | Disposition: A | Discharge status: Home / Self Care | Payer: Self-pay | Source: Ambulatory Visit | Attending: Cardiovascular Disease | Admitting: Cardiovascular Disease

## 2015-03-16 ENCOUNTER — Encounter (HOSPITAL_COMMUNITY)
Admission: RE | Admit: 2015-03-16 | Discharge: 2015-03-16 | Disposition: A | Discharge status: Home / Self Care | Payer: Self-pay | Source: Ambulatory Visit | Attending: Cardiovascular Disease | Admitting: Cardiovascular Disease

## 2015-03-18 ENCOUNTER — Encounter (HOSPITAL_COMMUNITY): Payer: Self-pay

## 2015-03-20 ENCOUNTER — Encounter (HOSPITAL_COMMUNITY): Payer: Self-pay

## 2015-03-23 ENCOUNTER — Encounter (HOSPITAL_COMMUNITY)
Admission: RE | Admit: 2015-03-23 | Discharge: 2015-03-23 | Disposition: A | Discharge status: Home / Self Care | Payer: Self-pay | Source: Ambulatory Visit | Attending: Cardiovascular Disease | Admitting: Cardiovascular Disease

## 2015-03-25 ENCOUNTER — Encounter (HOSPITAL_COMMUNITY)
Admission: RE | Admit: 2015-03-25 | Discharge: 2015-03-25 | Disposition: A | Discharge status: Home / Self Care | Payer: Self-pay | Source: Ambulatory Visit | Attending: Cardiovascular Disease | Admitting: Cardiovascular Disease

## 2015-03-27 ENCOUNTER — Encounter (HOSPITAL_COMMUNITY): Payer: Self-pay

## 2015-03-30 ENCOUNTER — Encounter (HOSPITAL_COMMUNITY): Payer: Self-pay

## 2015-04-01 ENCOUNTER — Encounter (HOSPITAL_COMMUNITY): Payer: Self-pay

## 2015-04-03 ENCOUNTER — Encounter (HOSPITAL_COMMUNITY)
Admission: RE | Admit: 2015-04-03 | Discharge: 2015-04-03 | Disposition: A | Discharge status: Home / Self Care | Payer: Self-pay | Source: Ambulatory Visit | Attending: Cardiovascular Disease | Admitting: Cardiovascular Disease

## 2015-04-06 ENCOUNTER — Encounter (HOSPITAL_COMMUNITY)
Admission: RE | Admit: 2015-04-06 | Discharge: 2015-04-06 | Disposition: A | Discharge status: Home / Self Care | Payer: Self-pay | Source: Ambulatory Visit | Attending: Cardiovascular Disease | Admitting: Cardiovascular Disease

## 2015-04-08 ENCOUNTER — Encounter (HOSPITAL_COMMUNITY)
Admission: RE | Admit: 2015-04-08 | Discharge: 2015-04-08 | Disposition: A | Discharge status: Home / Self Care | Payer: Self-pay | Source: Ambulatory Visit | Attending: Cardiovascular Disease | Admitting: Cardiovascular Disease

## 2015-04-10 ENCOUNTER — Encounter (HOSPITAL_COMMUNITY): Payer: Self-pay

## 2015-04-13 ENCOUNTER — Encounter (HOSPITAL_COMMUNITY): Payer: Self-pay

## 2015-04-15 ENCOUNTER — Encounter (HOSPITAL_COMMUNITY): Payer: Self-pay

## 2015-04-17 ENCOUNTER — Encounter (HOSPITAL_COMMUNITY): Payer: Self-pay

## 2015-04-20 ENCOUNTER — Encounter (HOSPITAL_COMMUNITY): Payer: Self-pay

## 2015-04-22 ENCOUNTER — Encounter (HOSPITAL_COMMUNITY): Payer: Self-pay

## 2015-04-23 ENCOUNTER — Other Ambulatory Visit: Payer: Self-pay | Admitting: Cardiovascular Disease

## 2015-04-24 ENCOUNTER — Encounter (HOSPITAL_COMMUNITY): Payer: Self-pay

## 2015-04-27 ENCOUNTER — Encounter (HOSPITAL_COMMUNITY): Payer: Self-pay

## 2015-04-29 ENCOUNTER — Encounter (HOSPITAL_COMMUNITY): Payer: Self-pay

## 2015-04-30 ENCOUNTER — Other Ambulatory Visit: Payer: Self-pay | Admitting: Cardiovascular Disease

## 2015-05-01 ENCOUNTER — Encounter (HOSPITAL_COMMUNITY): Payer: Self-pay

## 2015-05-04 ENCOUNTER — Encounter (HOSPITAL_COMMUNITY): Payer: Self-pay

## 2015-05-06 ENCOUNTER — Encounter (HOSPITAL_COMMUNITY): Payer: Self-pay

## 2015-05-08 ENCOUNTER — Encounter (HOSPITAL_COMMUNITY): Payer: Self-pay

## 2015-05-11 ENCOUNTER — Encounter (HOSPITAL_COMMUNITY): Payer: Self-pay

## 2015-05-13 ENCOUNTER — Encounter (HOSPITAL_COMMUNITY): Payer: Self-pay

## 2015-05-15 ENCOUNTER — Encounter (HOSPITAL_COMMUNITY): Payer: Self-pay

## 2015-05-18 ENCOUNTER — Encounter (HOSPITAL_COMMUNITY): Payer: Self-pay

## 2015-05-22 DIAGNOSIS — M1712 Unilateral primary osteoarthritis, left knee: Secondary | ICD-10-CM | POA: Diagnosis not present

## 2015-05-25 ENCOUNTER — Other Ambulatory Visit: Payer: Self-pay | Admitting: Internal Medicine

## 2015-05-25 ENCOUNTER — Ambulatory Visit: Payer: PRIVATE HEALTH INSURANCE | Admitting: Internal Medicine

## 2015-06-01 ENCOUNTER — Ambulatory Visit (INDEPENDENT_AMBULATORY_CARE_PROVIDER_SITE_OTHER): Payer: Medicare Other | Admitting: Internal Medicine

## 2015-06-01 ENCOUNTER — Encounter: Payer: Self-pay | Admitting: Internal Medicine

## 2015-06-01 VITALS — BP 130/68 | HR 92 | Temp 97.6°F | Wt 299.0 lb

## 2015-06-01 DIAGNOSIS — E1159 Type 2 diabetes mellitus with other circulatory complications: Secondary | ICD-10-CM

## 2015-06-01 DIAGNOSIS — E1151 Type 2 diabetes mellitus with diabetic peripheral angiopathy without gangrene: Secondary | ICD-10-CM

## 2015-06-01 NOTE — Patient Instructions (Signed)
Please continue: - Glipizide 10 mg 2x a day - Metformin 1000 mg 2x a day - Januvia 100 mg daily   Please stop at the lab.  Please return in 3 months with your sugar log.

## 2015-06-01 NOTE — Progress Notes (Addendum)
Patient ID: [name], male   DOB: 08-11-1946, 69 y.o.   MRN: [id number]  HPI: [name] is a 69 y.o.-year-old male, returning for DM2, dx 2004, non-insulin-dependent, uncontrolled, with complications (CAD, s/p MI). Last visit 3 mo ago.  He had a steroid inj in L knee 2 weeks ago >> pain much better.  Last hemoglobin A1c was: 02/23/2015: HbA1c calculated from fructosamine is 6.88% 10/15/2014: HbA1c calculated from fructosamine is 7.17% Lab Results  Component Value Date   HGBA1C 8.7* 10/15/2014   HGBA1C 7.7* 07/11/2014   HGBA1C 7.7* 04/07/2014  He started to change his eating habits since 03/2013.   Pt is on a regimen of: - Glipizide 10 mg bid  - Metformin 1000 mg bid  - Januvia 100 mg daily  He does not miss doses.  Pt checks his sugars 2-3 (we reviewed his log) - better!: - am: 108-112 >> 105-136 >> 81-93 >> 93-112 >> 92-113 >> 143-167 >> 96-118 - 2h after breakfast: 163-281 >> 166-194 >> 116-159, 184 >> 109-139 >> 151-174 >> 133-153 - before lunch 144 >> 110-120s >> n/c  - before dinner: 110-120s >> 85-172 >> 82-110 >> 91-122 >> 89-105 >> 139-183 >> 89-112 - 2h after dinner: 150-306 (200s mostly) >> 177-193 >> 119-156 >> 107-136 >> 159-190 >> 140-167 No lows; he has hypoglycemia awareness at mid60s.   His wife is a Radiographer, therapeutic >> he is still eating sweets occasionally.  He goes to [employer] place - 3x a week (MWF) - 1h/session, starting 8:30-9 am.  He works 2x a week. He is seeing the nutritionist at the Cardiac Rehab.  - no CKD, last BUN/creatinine:  Lab Results  Component Value Date   BUN 17 05/09/2014   CREATININE 1.0 05/09/2014  he is on Benicar 40. - last set of lipids: Lab Results  Component Value Date   CHOL 90 05/09/2014   HDL 59.50 05/09/2014   LDLCALC 9 05/09/2014   LDLDIRECT 12.8 04/02/2013   TRIG 109.0 05/09/2014   CHOLHDL 2 05/09/2014  He is on omega 3 fatty acids.  - last eye exam was (Dr [name]): Abstract on 02/18/2015   Component Date Value Ref Range Status  . HM Diabetic Eye Exam 07/16/2014 No Retinopathy  No Retinopathy Final   - no numbness and tingling in his feet.  He also has a history of hypothyroidism, hyperlipidemia, OSA-on CPAP, very compliant, A. Fib-on Plavix and pradaxa, IBD, GERD, vitamin B12 deficiency - on im B12, obesity.  ROS: Constitutional: no weight gain/loss, no fatigue, no subjective hyperthermia/hypothermia Eyes: no blurry vision, no xerophthalmia ENT: no sore throat, no nodules palpated in throat, no dysphagia/odynophagia, no hoarseness;  Cardiovascular: no CP/SOB/palpitations/leg swelling Respiratory: no cough/SOB Gastrointestinal: no N/V/D/C Musculoskeletal: no muscle/+ joint aches Skin: no rashes;   I reviewed pt's medications, allergies, PMH, social hx, family hx, and changes were documented in the history of present illness. Otherwise, unchanged from my initial visit note.  PE: BP 130/68 mmHg  Pulse 92  Temp(Src) 97.6 F (36.4 C)  Wt 299 lb (135.626 kg) Body mass index is 45.47 kg/(m^2). Wt Readings from Last 3 Encounters:  06/01/15 299 lb (135.626 kg)  02/23/15 296 lb (134.265 kg)  02/09/15 304 lb (137.893 kg)   Constitutional: obese, protuberant abdomen, in NAD Eyes: PERRLA, EOMI, no exophthalmos ENT: moist mucous membranes, no thyromegaly, no cervical lymphadenopathy Cardiovascular: irregularly irregular, regular rate, No MRG; + bilateral leg swelling (compression hoses) Respiratory: CTA B Gastrointestinal: abdomen soft, NT, ND, BS+  Musculoskeletal: no deformities, strength intact in all 4 Skin: moist, warm, no rashes  ASSESSMENT: 1. DM2, non-insulin-dependent, uncontrolled, with complications - CAD, s/p MI 10/2006 - s/p stent - sees [name]  PLAN:  1. Patient with long-standing DM2, now with much improved control after improving diet. Sugars were not much changed after the steroid inj. - I suggested to continue current regimen:  Patient  Instructions  Please continue: - Glipizide 10 mg 2x a day - Metformin 1000 mg 2x a day - Januvia 100 mg daily   Please stop at the lab.  Please return in 3 months with your sugar log.   - given more sugar logs - will check fructosamine today - UTD with eye exams - Return to clinic in 3 mo with sugar log   Office Visit on 06/01/2015  Component Date Value Ref Range Status  . Fructosamine 06/01/2015 318* 190 - 270 umol/L Final    HbA1c calculated from fructosamine is 7.0%.

## 2015-06-03 LAB — FRUCTOSAMINE: Fructosamine: 318 umol/L — ABNORMAL HIGH (ref 190–270)

## 2015-06-25 ENCOUNTER — Other Ambulatory Visit: Payer: Self-pay | Admitting: Internal Medicine

## 2015-06-26 ENCOUNTER — Ambulatory Visit: Payer: PRIVATE HEALTH INSURANCE | Admitting: Cardiovascular Disease

## 2015-06-29 ENCOUNTER — Ambulatory Visit (INDEPENDENT_AMBULATORY_CARE_PROVIDER_SITE_OTHER): Payer: Medicare Other | Admitting: Family Medicine

## 2015-06-29 ENCOUNTER — Encounter: Payer: Self-pay | Admitting: Family Medicine

## 2015-06-29 VITALS — BP 130/66 | HR 60 | Temp 97.8°F | Wt 302.0 lb

## 2015-06-29 DIAGNOSIS — E038 Other specified hypothyroidism: Secondary | ICD-10-CM

## 2015-06-29 DIAGNOSIS — E785 Hyperlipidemia, unspecified: Secondary | ICD-10-CM | POA: Diagnosis not present

## 2015-06-29 DIAGNOSIS — E034 Atrophy of thyroid (acquired): Secondary | ICD-10-CM

## 2015-06-29 DIAGNOSIS — I10 Essential (primary) hypertension: Secondary | ICD-10-CM | POA: Diagnosis not present

## 2015-06-29 DIAGNOSIS — M1712 Unilateral primary osteoarthritis, left knee: Secondary | ICD-10-CM

## 2015-06-29 DIAGNOSIS — Z23 Encounter for immunization: Secondary | ICD-10-CM | POA: Diagnosis not present

## 2015-06-29 LAB — LIPID PANEL
CHOL/HDL RATIO: 2
Cholesterol: 96 mg/dL (ref 0–200)
HDL: 59.8 mg/dL (ref >39.00)
LDL Cholesterol: -3 mg/dL — ABNORMAL LOW (ref 0–99)
NONHDL: 35.95
Triglycerides: 195 mg/dL — ABNORMAL HIGH (ref 0.0–149.0)
VLDL: 39 mg/dL (ref 0.0–40.0)

## 2015-06-29 LAB — COMPREHENSIVE METABOLIC PANEL
ALT: 10 U/L (ref 0–53)
AST: 13 U/L (ref 0–37)
Albumin: 4 g/dL (ref 3.5–5.2)
Alkaline Phosphatase: 80 U/L (ref 39–117)
BILIRUBIN TOTAL: 0.6 mg/dL (ref 0.2–1.2)
BUN: 12 mg/dL (ref 6–23)
CO2: 29 mEq/L (ref 19–32)
CREATININE: 0.81 mg/dL (ref 0.40–1.50)
Calcium: 8.8 mg/dL (ref 8.4–10.5)
Chloride: 100 mEq/L (ref 96–112)
GFR: 100.3 mL/min (ref >60.00)
GLUCOSE: 168 mg/dL — AB (ref 70–99)
Potassium: 3.6 mEq/L (ref 3.5–5.1)
Sodium: 138 mEq/L (ref 135–145)
TOTAL PROTEIN: 6.5 g/dL (ref 6.0–8.3)

## 2015-06-29 LAB — CBC
HCT: 43.1 % (ref 39.0–52.0)
Hemoglobin: 13.9 g/dL (ref 13.0–17.0)
MCHC: 32.4 g/dL (ref 30.0–36.0)
MCV: 84.2 fl (ref 78.0–100.0)
PLATELETS: 176 10*3/uL (ref 150.0–400.0)
RBC: 5.12 Mil/uL (ref 4.22–5.81)
RDW: 14.6 % (ref 11.5–15.5)
WBC: 9.6 10*3/uL (ref 4.0–10.5)

## 2015-06-29 LAB — TSH: TSH: 1.66 u[IU]/mL (ref 0.35–4.50)

## 2015-06-29 MED ORDER — TRIAMCINOLONE ACETONIDE 0.1 % EX CREA: 1.0000 "application " | TOPICAL_CREAM | Freq: Two times a day (BID) | CUTANEOUS | Status: DC

## 2015-06-29 NOTE — Progress Notes (Signed)
Garret Reddish, MD  Subjective:  [name] is a 69 y.o. year old very pleasant male patient who presents for/with See problem oriented charting  ROS- no chest pain or shortness of breath. No unintentional weight loss or weight gain. No myalgias. Does have left knee pain.  Past Medical History-  Patient Active Problem List   Diagnosis Date Noted  . CHF (congestive heart failure), NYHA class II 05/09/2014    Priority: High  . IBD (inflammatory bowel disease) 08/09/2011    Priority: High  . Atrial fibrillation 12/08/2010    Priority: High  . Type II diabetes mellitus with peripheral circulatory disorder 04/16/2007    Priority: High  . CORONARY ARTERY DISEASE 04/12/2007    Priority: High  . Obesity 08/09/2011    Priority: Medium  . Hyperlipidemia 04/16/2007    Priority: Medium  . Hypothyroidism 04/12/2007    Priority: Medium  . Essential hypertension 04/12/2007    Priority: Medium  . SLEEP APNEA 04/12/2007    Priority: Medium  . Osteoarthritis of left knee 02/09/2015    Priority: Low  . Allergic rhinitis 06/25/2012    Priority: Low  . GERD (gastroesophageal reflux disease) 08/09/2011    Priority: Low  . VITAMIN B12 DEFICIENCY 11/29/2010    Priority: Low    Medications- reviewed and updated Current Outpatient Prescriptions  Medication Sig Dispense Refill  . amLODipine (NORVASC) 10 MG tablet TAKE ONE (1) TABLET BY MOUTH EVERY DAY 30 tablet 3  . BENICAR 40 MG tablet TAKE ONE (1) TABLET EACH DAY 30 tablet 5  . carvedilol (COREG) 12.5 MG tablet TAKE 1 TABLET MID DAY 30 tablet 1  . carvedilol (COREG) 25 MG tablet TAKE ONE TABLET BY MOUTH TWICE A DAY WITH A MEAL 60 tablet 3  . clopidogrel (PLAVIX) 75 MG tablet TAKE ONE (1) TABLET EACH DAY 30 tablet 5  . diclofenac sodium (VOLTAREN) 1 % GEL Apply 2 g topically 4 (four) times daily as needed. 100 g 5  . fish oil-omega-3 fatty acids 1000 MG capsule Take 2 g by mouth daily.     . furosemide (LASIX) 40 MG tablet TAKE ONE (1)  TABLET EACH DAY 30 tablet 3  . glipiZIDE (GLUCOTROL) 10 MG tablet TAKE ONE TABLET TWICE DAILY 180 tablet 1  . Glucosamine-Chondroitin (OSTEO BI-FLEX REGULAR STRENGTH) 250-200 MG TABS Take 1 tablet by mouth 2 (two) times daily.     Marland Kitchen glucose blood (ONE TOUCH ULTRA TEST) test strip Use to test blood sugar 2 times daily as advised. Dx: E11.59 100 each 5  . JANUVIA 100 MG tablet TAKE ONE (1) TABLET EACH DAY 30 tablet 2  . mesalamine (APRISO) 0.375 G 24 hr capsule TAKE 4 CAPSULES ONCE DAILY 120 capsule 11  . metFORMIN (GLUCOPHAGE) 1000 MG tablet TAKE ONE TABLET TWICE DAILY WITH A MEAL 180 tablet 1  . potassium chloride SA (K-DUR,KLOR-CON) 20 MEQ tablet TAKE ONE (1) TABLET EACH DAY 90 tablet 1  . PRADAXA 150 MG CAPS capsule TAKE ONE (1) CAPSULE BY MOUTH TWICE A DAY. (EVERY 12 HOURS.) 60 capsule 3  . STUDY MEDICATION REVEAL STUDY    . SYNTHROID 50 MCG tablet Take 1 tablet (50 mcg total) by mouth daily before breakfast. 90 tablet 3  . triamcinolone cream (KENALOG) 0.1 % Apply 1 application topically 2 (two) times daily. 30 g 0   No current facility-administered medications for this visit.    Objective: BP 130/66 mmHg  Pulse 60  Temp(Src) 97.8 F (36.6 C)  Wt  302 lb (136.986 kg) Gen: NAD, resting comfortably CV: irregularly irregular no murmurs rubs or gallops Lungs: CTAB no crackles, wheeze, rhonchi Abdomen: soft/nontender/nondistended/normal bowel sounds. No rebound or guarding. Obese Ext: 1+ under compression stockings Skin: warm, dry  Assessment/Plan:  Osteoarthritis of left knee S:voltaren gel helped some . Cortisone injection Raliegh Ip recently more beneficial A/P: Continue as needed Voltaren. We discussed how cortisone injections could affect his diabetes. He is considering hyaluronate  Injections as well.    Essential hypertension S: controlled. On Amlodipine 16m, benicar 468m coreg 2594mID + 12.5 at lunch  BP Readings from Last 3 Encounters:  06/29/15 130/66  06/01/15  130/68  02/23/15 124/80  A/P:Continue current meds  Hyperlipidemia S: In reveal study which ends in late October. LDL actually calculates to -3. Very well controlled. Lab Results  Component Value Date   CHOL 96 06/29/2015   HDL 59.80 06/29/2015   LDLCALC -3* 06/29/2015   LDLDIRECT 12.8 04/02/2013   TRIG 195.0* 06/29/2015   CHOLHDL 2 06/29/2015  A/P: continue with reveal study agent- await results after trial   Hypothyroidism S: controlled. On Synthroid 42m55mab Results  Component Value Date   TSH 1.66 06/29/2015  A/P:Continue current meds  Dry elbows  S: for months. Some hypertrophy of skin and scale. A/P: Trial triamcinolone for 1 week then use emollient regularly. I doubt psoriasis.  January follow-up plan. Return precautions advised.   fasting Orders Placed This Encounter  Procedures  . CBC    [place]  . Comprehensive metabolic panel    [place]    Order Specific Question:  Has the patient fasted?    Answer:  No  . Lipid panel    [place]    Order Specific Question:  Has the patient fasted?    Answer:  No  . TSH    [place]    Meds ordered this encounter  Medications  . triamcinolone cream (KENALOG) 0.1 %    Sig: Apply 1 application topically 2 (two) times daily.    Dispense:  30 g    Refill:  0   Health Maintenance Due  . INFLUENZA VACCINE -done per patient and KebaBevelyn Ngo will follow up on documentation 05/11/2015

## 2015-06-29 NOTE — Assessment & Plan Note (Signed)
S: In reveal study which ends in late October. LDL actually calculates to -3. Very well controlled. Lab Results  Component Value Date   CHOL 96 06/29/2015   HDL 59.80 06/29/2015   LDLCALC -3* 06/29/2015   LDLDIRECT 12.8 04/02/2013   TRIG 195.0* 06/29/2015   CHOLHDL 2 06/29/2015  A/P: continue with reveal study agent- await results after trial

## 2015-06-29 NOTE — Assessment & Plan Note (Signed)
S: controlled. On Amlodipine 36m, benicar 441m coreg 257mID + 12.5 at lunch  BP Readings from Last 3 Encounters:  06/29/15 130/66  06/01/15 130/68  02/23/15 124/80  A/P:Continue current meds

## 2015-06-29 NOTE — Assessment & Plan Note (Signed)
S:voltaren gel helped some . Cortisone injection Raliegh Ip recently more beneficial A/P: Continue as needed Voltaren. We discussed how cortisone injections could affect his diabetes. He is considering hyaluronate  Injections as well.

## 2015-06-29 NOTE — Assessment & Plan Note (Signed)
S: controlled. On Synthroid 42mg Lab Results  Component Value Date   TSH 1.66 06/29/2015  A/P:Continue current meds

## 2015-06-29 NOTE — Patient Instructions (Addendum)
Flu shot received today.  No changes  For elbow- get a small vaseline container and leave it by where you watch tv- use it twice a day to keep lubricated. If you want something less greasy- try Eucerin cream. FOr next week, use triamcinolone twice a day to soften area on elbows but cover the elbows with either eucerin or vaseline as well  Update fasting labs on your way out

## 2015-07-08 ENCOUNTER — Encounter: Payer: Self-pay | Admitting: Cardiovascular Disease

## 2015-07-08 ENCOUNTER — Ambulatory Visit (INDEPENDENT_AMBULATORY_CARE_PROVIDER_SITE_OTHER): Payer: Medicare Other | Admitting: Cardiovascular Disease

## 2015-07-08 VITALS — BP 138/72 | HR 82 | Ht 70.0 in | Wt 302.4 lb

## 2015-07-08 DIAGNOSIS — I252 Old myocardial infarction: Secondary | ICD-10-CM | POA: Diagnosis not present

## 2015-07-08 DIAGNOSIS — I480 Paroxysmal atrial fibrillation: Secondary | ICD-10-CM

## 2015-07-08 DIAGNOSIS — R0602 Shortness of breath: Secondary | ICD-10-CM | POA: Diagnosis not present

## 2015-07-08 NOTE — Progress Notes (Signed)
Cardiology Office Note Date:  07/08/2015   ID:  [name], [name] [birth year], MRN [id number]  PCP:  Garret Reddish, MD  Cardiologist:  Sherren Mocha, MD    Chief Complaint  Patient presents with  . Shortness of Breath   History of Present Illness: [name] is a 69 y.o. male who presents for follow-up of atrial fibrillation and coronary artery disease. He has failed cardioversion but has tolerated chronic atrial fibrillation without symptoms. He is anticoagulated with pradaxa. The patient also has a history of coronary artery disease and stent thrombosis. He had been maintained on triple therapy , but we did P2Y12 testing in 2013 confirming his responsiveness to Plavix. Aspirin was discontinued at that time so he is maintained on chronic oral anticoagulation with Pradaxa and antiplatelet therapy with clopidogrel.  The patient complains of shortness of breath with activity. He primarily notices this when he is walking back up his driveway which is on an incline. He denies resting shortness of breath. He denies chest pain or pressure. He does have chronic leg swelling. He continues to struggle with his weight which is long-standing. No orthopnea, PND, heart palpitations, lightheadedness, or syncope. He has had no bleeding problems on a combination of clopidogrel and Pradaxa.  Past Medical History  Diagnosis Date  . CORONARY ARTERY DISEASE 04/12/2007    2 stents last in 2006.   Marland Kitchen DIABETES MELLITUS, TYPE II 04/16/2007  . HYPERLIPIDEMIA 04/16/2007    reveal study. not sure of medication  . HYPERTENSION 04/12/2007  . HYPOTHYROIDISM 04/12/2007  . MYOCARDIAL INFARCTION, HX OF 04/12/2007  . OBESITY 06/16/2009  . SLEEP APNEA 04/12/2007    CPAP  . ULCERATIVE COLITIS, LEFT SIDED 11/23/2010  . Ischemic cardiomyopathy     cath 35-40%  . Atrial fibrillation     initial diagnoses 2012  . B12 deficiency     per patient previously taking shots  . Diverticulosis of colon (without mention of hemorrhage)      Past Surgical History  Procedure Laterality Date  . Coronary stent placement  2008    LAD   . Cardiac catheterization  10/2002    STENT. 2 stents Dr. Maurene Capes  . Tonsillectomy      Current Outpatient Prescriptions  Medication Sig Dispense Refill  . amLODipine (NORVASC) 10 MG tablet TAKE ONE (1) TABLET BY MOUTH EVERY DAY 30 tablet 3  . carvedilol (COREG) 12.5 MG tablet Take 12.5 mg by mouth daily at 12 noon.    . carvedilol (COREG) 25 MG tablet TAKE ONE TABLET BY MOUTH TWICE A DAY WITH A MEAL 60 tablet 3  . clopidogrel (PLAVIX) 75 MG tablet Take 75 mg by mouth daily.    . diclofenac sodium (VOLTAREN) 1 % GEL Apply 2 g topically 4 (four) times daily as needed (knee pain).    . fish oil-omega-3 fatty acids 1000 MG capsule Take 2 g by mouth daily.     . furosemide (LASIX) 40 MG tablet Take 40 mg by mouth daily.    Marland Kitchen glipiZIDE (GLUCOTROL) 10 MG tablet Take 10 mg by mouth 2 (two) times daily before a meal.    . Glucosamine-Chondroitin (OSTEO BI-FLEX REGULAR STRENGTH) 250-200 MG TABS Take 1 tablet by mouth 2 (two) times daily.     Marland Kitchen glucose blood (ONE TOUCH ULTRA TEST) test strip Use to test blood sugar 2 times daily as advised. Dx: E11.59 100 each 5  . mesalamine (APRISO) 0.375 G 24 hr capsule Take by mouth daily. TAke 4  capsules by mouth daily    . metFORMIN (GLUCOPHAGE) 1000 MG tablet Take 1,000 mg by mouth 2 (two) times daily with a meal.    . olmesartan (BENICAR) 40 MG tablet Take 40 mg by mouth daily.    . potassium chloride SA (K-DUR,KLOR-CON) 20 MEQ tablet Take 20 mEq by mouth daily.    Marland Kitchen PRADAXA 150 MG CAPS capsule TAKE ONE (1) CAPSULE BY MOUTH TWICE A DAY. (EVERY 12 HOURS.) 60 capsule 3  . sitaGLIPtin (JANUVIA) 100 MG tablet Take 100 mg by mouth daily.    . STUDY MEDICATION REVEAL STUDY    . SYNTHROID 50 MCG tablet Take 1 tablet (50 mcg total) by mouth daily before breakfast. 90 tablet 3  . triamcinolone cream (KENALOG) 0.1 % Apply 1 application topically 2 (two) times daily. 30 g  0   No current facility-administered medications for this visit.    Allergies:   Review of patient's allergies indicates no known allergies.   Social History:  The patient  reports that he quit smoking about 48 years ago. His smoking use included Cigarettes. He has a 5 pack-year smoking history. He has never used smokeless tobacco. He reports that he drinks alcohol. He reports that he does not use illicit drugs.   Family History:  The patient's  family history includes Heart attack in his mother; Heart disease in his father; Hypertension in his brother; Obesity in his brother; Stomach cancer in his maternal aunt and paternal grandmother.    ROS:  Please see the history of present illness.  Otherwise, review of systems is positive for shortness of breath with activity, irregular heartbeats.  All other systems are reviewed and negative.    PHYSICAL EXAM: VS:  BP 138/72 mmHg  Pulse 82  Ht 5' 10"  (1.778 m)  Wt 302 lb 6.4 oz (137.168 kg)  BMI 43.39 kg/m2 , BMI Body mass index is 43.39 kg/(m^2). GEN: Well nourished, well developed, in no acute distress HEENT: normal Neck: no JVD, no masses. No carotid bruits Cardiac: Irregularly irregular without murmur or gallop            Respiratory:  clear to auscultation bilaterally, normal work of breathing GI: soft, nontender, nondistended, + BS MS: no deformity or atrophy Ext: 1+ pretibial edema on the right, 2+ on the left, pedal pulses 2+= bilaterally Skin: warm and dry, no rash Neuro:  Strength and sensation are intact Psych: euthymic mood, full affect  EKG:  EKG is ordered today. The ekg ordered today shows atrial fibrillation 82 bpm, voltage criteria for LVH, nonspecific T-wave abnormality.  Recent Labs: 06/29/2015: ALT 10; BUN 12; Creatinine, Ser 0.81; Hemoglobin 13.9; Platelets 176.0; Potassium 3.6; Sodium 138; TSH 1.66   Lipid Panel     Component Value Date/Time   CHOL 96 06/29/2015 1450   TRIG 195.0* 06/29/2015 1450   HDL 59.80  06/29/2015 1450   CHOLHDL 2 06/29/2015 1450   VLDL 39.0 06/29/2015 1450   LDLCALC -3* 06/29/2015 1450   LDLDIRECT 12.8 04/02/2013 0821      Wt Readings from Last 3 Encounters:  07/08/15 302 lb 6.4 oz (137.168 kg)  06/29/15 302 lb (136.986 kg)  06/01/15 299 lb (135.626 kg)    ASSESSMENT AND PLAN: 1.  CAD, native vessel, without angina: The patient remains on long-term Plavix after a history of stent thrombosis. He seems to be doing reasonably well and no changes were made in his medical regimen today.  2. Chronic atrial fibrillation: The patient will continue anticoagulation  with Pradaxa. He's had no bleeding problems.  3. Shortness of breath with New York Heart Association functional class II symptoms. May be related to diastolic heart failure. Will check an echocardiogram in the setting of his atrial fibrillation and shortness of breath to exclude systolic dysfunction. Also could be multifactorial considering his obesity, coronary disease, and atrial fibrillation.  4. Essential hypertension: Blood pressure is controlled on his current regimen.  Overall the patient appears stable. There has been no significant change in his weight over the past few years. He continues to participating in cardiac rehabilitation 3 days weekly. Will update his echocardiogram and unless there are significant problems I will see him back in one year. He was counseled regarding lifestyle modification today.   Current medicines are reviewed with the patient today.  The patient does not have concerns regarding medicines.  Labs/ tests ordered today include:   Orders Placed This Encounter  Procedures  . EKG 12-Lead  . Echocardiogram    Disposition:   FU one year  Signed, Sherren Mocha, MD  07/08/2015 4:15 PM    [facility] [address], [place], [place]  [address] Phone: [phone]; Fax: [phone]

## 2015-07-08 NOTE — Patient Instructions (Signed)
Medication Instructions:  None  Labwork: None  Testing/Procedures: Your physician has requested that you have an echocardiogram. Echocardiography is a painless test that uses sound waves to create images of your heart. It provides your doctor with information about the size and shape of your heart and how well your heart's chambers and valves are working. This procedure takes approximately one hour. There are no restrictions for this procedure.   Follow-Up: Your physician wants you to follow-up in: 1 year with [name]. You will receive a reminder letter in the mail two months in advance. If you don't receive a letter, please call our office to schedule the follow-up appointment.   Any Other Special Instructions Will Be Listed Below (If Applicable).

## 2015-07-15 ENCOUNTER — Other Ambulatory Visit: Payer: Self-pay

## 2015-07-15 ENCOUNTER — Ambulatory Visit (HOSPITAL_COMMUNITY): Payer: Medicare Other | Attending: Cardiology

## 2015-07-15 DIAGNOSIS — I358 Other nonrheumatic aortic valve disorders: Secondary | ICD-10-CM | POA: Diagnosis not present

## 2015-07-15 DIAGNOSIS — R29898 Other symptoms and signs involving the musculoskeletal system: Secondary | ICD-10-CM | POA: Diagnosis not present

## 2015-07-15 DIAGNOSIS — I480 Paroxysmal atrial fibrillation: Secondary | ICD-10-CM | POA: Insufficient documentation

## 2015-07-15 DIAGNOSIS — I340 Nonrheumatic mitral (valve) insufficiency: Secondary | ICD-10-CM | POA: Diagnosis not present

## 2015-07-15 DIAGNOSIS — I517 Cardiomegaly: Secondary | ICD-10-CM | POA: Insufficient documentation

## 2015-07-25 ENCOUNTER — Other Ambulatory Visit: Payer: Self-pay | Admitting: Family Medicine

## 2015-07-25 ENCOUNTER — Other Ambulatory Visit: Payer: Self-pay | Admitting: Cardiovascular Disease

## 2015-07-27 ENCOUNTER — Other Ambulatory Visit: Payer: Self-pay

## 2015-07-27 DIAGNOSIS — R0602 Shortness of breath: Secondary | ICD-10-CM

## 2015-08-03 ENCOUNTER — Other Ambulatory Visit (HOSPITAL_COMMUNITY): Payer: Medicare Other

## 2015-08-04 ENCOUNTER — Other Ambulatory Visit: Payer: Self-pay

## 2015-08-04 DIAGNOSIS — E785 Hyperlipidemia, unspecified: Secondary | ICD-10-CM

## 2015-08-04 MED ORDER — ATORVASTATIN CALCIUM 20 MG PO TABS: 20.0000 mg | ORAL_TABLET | Freq: Every day | ORAL | Status: DC

## 2015-08-04 NOTE — Progress Notes (Signed)
Lauren - can you call in atorvastatin 20 mg and check lipids/lft's in 3 months? thx        Previous Messages     ----- Message -----   From: [name], RN   Sent: 07/29/2015  5:12 PM    To: Sherren Mocha, MD  Subject: End of REVEAL Research Study           [name] has been taking part in the [place] for the past 4 years. As part of the study, he has been taking atorvastatin 20 mg daily and anacetrapib 100 mg or placebo daily. Both medications were provided to the patient as part of the study. The study has ended and he will need a prescription for a statin. It is recommended that he continues on a similar lipid-lowering potency to that which he has been taking during the study. Also, it is advised against measuring lipids at this stage as it may be difficult to interpret the results until several weeks after stopping the study treatment. He will stop all study medication on 08/03/2015.   So that statin therapy is not interrupted, please call in a prescription for statin therapy for this patient.   Blossom Hoops, RN, Research Nurse              I spoke with the pt and made him aware that Atorvastatin 79m daily Rx has been sent to the pharmacy.  The pt will be due for repeat lipid and liver 11/04/15. Pt verbalized understanding.

## 2015-08-10 ENCOUNTER — Other Ambulatory Visit: Payer: Self-pay

## 2015-08-10 ENCOUNTER — Ambulatory Visit (HOSPITAL_COMMUNITY): Payer: Medicare Other | Attending: Internal Medicine

## 2015-08-10 ENCOUNTER — Other Ambulatory Visit (HOSPITAL_COMMUNITY): Payer: Medicare Other | Admitting: *Deleted

## 2015-08-10 DIAGNOSIS — E785 Hyperlipidemia, unspecified: Secondary | ICD-10-CM | POA: Insufficient documentation

## 2015-08-10 DIAGNOSIS — I340 Nonrheumatic mitral (valve) insufficiency: Secondary | ICD-10-CM | POA: Insufficient documentation

## 2015-08-10 DIAGNOSIS — R0600 Dyspnea, unspecified: Secondary | ICD-10-CM | POA: Diagnosis not present

## 2015-08-10 DIAGNOSIS — R29898 Other symptoms and signs involving the musculoskeletal system: Secondary | ICD-10-CM | POA: Insufficient documentation

## 2015-08-10 DIAGNOSIS — E119 Type 2 diabetes mellitus without complications: Secondary | ICD-10-CM | POA: Insufficient documentation

## 2015-08-10 DIAGNOSIS — R0602 Shortness of breath: Secondary | ICD-10-CM | POA: Diagnosis not present

## 2015-08-10 DIAGNOSIS — I10 Essential (primary) hypertension: Secondary | ICD-10-CM | POA: Insufficient documentation

## 2015-08-10 DIAGNOSIS — I358 Other nonrheumatic aortic valve disorders: Secondary | ICD-10-CM | POA: Insufficient documentation

## 2015-08-10 DIAGNOSIS — I517 Cardiomegaly: Secondary | ICD-10-CM | POA: Diagnosis not present

## 2015-08-10 DIAGNOSIS — Z8249 Family history of ischemic heart disease and other diseases of the circulatory system: Secondary | ICD-10-CM | POA: Diagnosis not present

## 2015-08-10 MED ORDER — PERFLUTREN LIPID MICROSPHERE
1.0000 mL | INTRAVENOUS | Status: AC | PRN
  Administered 2015-08-10: 1.5 mL via INTRAVENOUS

## 2015-08-20 ENCOUNTER — Telehealth: Payer: Self-pay | Admitting: Cardiovascular Disease

## 2015-08-20 ENCOUNTER — Other Ambulatory Visit: Payer: Self-pay | Admitting: *Deleted

## 2015-08-20 DIAGNOSIS — R0602 Shortness of breath: Secondary | ICD-10-CM

## 2015-08-20 MED ORDER — SPIRONOLACTONE 25 MG PO TABS: 12.5000 mg | ORAL_TABLET | Freq: Every day | ORAL | Status: DC

## 2015-08-20 NOTE — Telephone Encounter (Signed)
Informed that [name] is aware that patient is on potassium sparing diuretic and potassium and we are checking electrolytes in about a week. She wanted to make sure [name] is aware.

## 2015-08-20 NOTE — Telephone Encounter (Signed)
New message     Question about spironolactone dosage.  Pt is on potassium.  What dosage/directions is pt to use for spironolactone.

## 2015-08-24 ENCOUNTER — Other Ambulatory Visit: Payer: Self-pay | Admitting: Cardiovascular Disease

## 2015-08-24 ENCOUNTER — Other Ambulatory Visit: Payer: Self-pay | Admitting: Family Medicine

## 2015-08-24 ENCOUNTER — Other Ambulatory Visit: Payer: Self-pay | Admitting: Internal Medicine

## 2015-08-30 ENCOUNTER — Other Ambulatory Visit: Payer: Self-pay | Admitting: Cardiovascular Disease

## 2015-08-31 ENCOUNTER — Other Ambulatory Visit (INDEPENDENT_AMBULATORY_CARE_PROVIDER_SITE_OTHER): Payer: Medicare Other | Admitting: *Deleted

## 2015-08-31 DIAGNOSIS — R0602 Shortness of breath: Secondary | ICD-10-CM | POA: Diagnosis not present

## 2015-08-31 LAB — BASIC METABOLIC PANEL
BUN: 13 mg/dL (ref 7–25)
CHLORIDE: 102 mmol/L (ref 98–110)
CO2: 24 mmol/L (ref 20–31)
Calcium: 9.1 mg/dL (ref 8.6–10.3)
Creat: 1.02 mg/dL (ref 0.70–1.25)
GLUCOSE: 156 mg/dL — AB (ref 65–99)
POTASSIUM: 4 mmol/L (ref 3.5–5.3)
Sodium: 139 mmol/L (ref 135–146)

## 2015-08-31 NOTE — Telephone Encounter (Signed)
Pt has two different strengths of Coreg. Coreg 25 mg tablet taken twice daily. Coreg 12.5 mg tablet taken at mid day. Please advise

## 2015-08-31 NOTE — Telephone Encounter (Signed)
Please contact the pt and clarify his dosage of Coreg.  The pt can be taking both the Coreg 76m twice a day and 12.574mmid day.  Thank you

## 2015-09-01 NOTE — Telephone Encounter (Signed)
Called pt to clarify dosage of Coreg. Pt stated that he is taking the Coreg 25 mg tablet, twice daily and also taking Coreg 12.5 mg tablet at Mid day. Pt stated that he was requesting a refill on the Coreg 25 mg tablet. Pt's Rx was sent to pt's pharmacy as requested. Confirmation received.

## 2015-09-07 ENCOUNTER — Encounter: Payer: Self-pay | Admitting: Internal Medicine

## 2015-09-07 ENCOUNTER — Ambulatory Visit (INDEPENDENT_AMBULATORY_CARE_PROVIDER_SITE_OTHER): Payer: Medicare Other | Admitting: Internal Medicine

## 2015-09-07 VITALS — BP 132/90 | HR 61 | Temp 97.8°F | Resp 12 | Wt 303.6 lb

## 2015-09-07 DIAGNOSIS — E1151 Type 2 diabetes mellitus with diabetic peripheral angiopathy without gangrene: Secondary | ICD-10-CM

## 2015-09-07 NOTE — Progress Notes (Signed)
Patient ID: [name], male   DOB: 08-Jun-1946, 69 y.o.   MRN: [id number]  HPI: [name] is a 69 y.o.-year-old male, returning for DM2, dx 2004, non-insulin-dependent, uncontrolled, with complications (CAD, s/p MI). Last visit 3 mo ago.  He ate out a lot over Thanksgiving holiday.  He had a steroid inj before last visit.  Last hemoglobin A1c was: 06/01/2015: HbA1c calculated from fructosamine is 7.0% 02/23/2015: HbA1c calculated from fructosamine is 6.88% 10/15/2014: HbA1c calculated from fructosamine is 7.17% Lab Results  Component Value Date   HGBA1C 8.7* 10/15/2014   HGBA1C 7.7* 07/11/2014   HGBA1C 7.7* 04/07/2014  He started to change his eating habits since 03/2013.   Pt is on a regimen of: - Glipizide 10 mg bid  - Metformin 1000 mg bid  - Januvia 100 mg daily  He does not miss doses.  Pt checks his sugars 2-3 (we reviewed his log): - am: 108-112 >> 105-136 >> 81-93 >> 93-112 >> 92-113 >> 143-167 >> 96-118 >> 99, 102-123 - 2h after breakfast: 163-281 >> 166-194 >> 116-159, 184 >> 109-139 >> 151-174 >> 133-153 >> 139-159 - before lunch 144 >> 110-120s >> n/c  - before dinner: 110-120s >> 85-172 >> 82-110 >> 91-122 >> 89-105 >> 139-183 >> 89-112 >> 90-111 - 2h after dinner: 150-306 (200s mostly) >> 177-193 >> 119-156 >> 107-136 >> 159-190 >> 140-167 >> 148-165 No lows; he has hypoglycemia awareness at mid60s.   His wife is a Radiographer, therapeutic >> he is still eating sweets occasionally.  He goes to [employer] place - 3x a week (MWF) - 1h/session, starting 8:30-9 am.  He works 2x a week. He is seeing the nutritionist at the [facility].  - no CKD, last BUN/creatinine:  Lab Results  Component Value Date   BUN 13 08/31/2015   CREATININE 1.02 08/31/2015  he is on Benicar 40. - last set of lipids: Lab Results  Component Value Date   CHOL 96 06/29/2015   HDL 59.80 06/29/2015   LDLCALC -3* 06/29/2015   LDLDIRECT 12.8 04/02/2013   TRIG 195.0* 06/29/2015    CHOLHDL 2 06/29/2015  He was in the Reveal Study x 4 years >> stopped 2 weeks ago. He is on omega 3 fatty acids. He is on Lipitor 20 mg.  - last eye exam was (Dr [name]): Abstract on 02/18/2015  Component Date Value Ref Range Status  . HM Diabetic Eye Exam 07/16/2014 No Retinopathy  No Retinopathy Final   - no numbness and tingling in his feet.  He also has a history of hypothyroidism, hyperlipidemia, OSA-on CPAP, very compliant, A. Fib-on Plavix and pradaxa, IBD, GERD, vitamin B12 deficiency - previously on im B12, obesity.  Last TSH: Lab Results  Component Value Date   TSH 1.66 06/29/2015  He takes LT4 50 mcg daily.   Since last visit, Dr Burt Knack stopped Norvasc and started Spironolactone.  ROS: Constitutional: no weight gain/loss, no fatigue, no subjective hyperthermia/hypothermia Eyes: no blurry vision, no xerophthalmia ENT: no sore throat, no nodules palpated in throat, no dysphagia/odynophagia, no hoarseness;  Cardiovascular: no CP/SOB/palpitations/leg swelling Respiratory: no cough/SOB Gastrointestinal: no N/V/D/C Musculoskeletal: no muscle/joint aches Skin: no rashes;   I reviewed pt's medications, allergies, PMH, social hx, family hx, and changes were documented in the history of present illness. Otherwise, unchanged from my initial visit note.  PE: BP 132/90 mmHg  Pulse 61  Temp(Src) 97.8 F (36.6 C) (Oral)  Resp 12  Wt 303 lb 9.6 oz (137.712  kg)  SpO2 94% Body mass index is 43.56 kg/(m^2). Wt Readings from Last 3 Encounters:  09/07/15 303 lb 9.6 oz (137.712 kg)  07/08/15 302 lb 6.4 oz (137.168 kg)  06/29/15 302 lb (136.986 kg)   Constitutional: obese, protuberant abdomen, in NAD Eyes: PERRLA, EOMI, no exophthalmos ENT: moist mucous membranes, no thyromegaly, no cervical lymphadenopathy Cardiovascular: irregularly irregular, regular rate, No MRG; + bilateral leg swelling (compression hoses) Respiratory: CTA B Gastrointestinal: abdomen soft, NT, ND,  BS+ Musculoskeletal: no deformities, strength intact in all 4 Skin: moist, warm, no rashes  ASSESSMENT: 1. DM2, non-insulin-dependent, uncontrolled, with complications - CAD, s/p MI 10/2006 - s/p stent - sees [name]  PLAN:  1. Patient with long-standing DM2, with much improved control after improving diet. Sugars were only higher during Thanksgiving, but not much changed. - I suggested to continue current regimen:  Patient Instructions  Please continue: - Glipizide 10 mg 2x a day - Metformin 1000 mg 2x a day - Januvia 100 mg daily   Please stop at the lab.  Please return in 3 months with your sugar log.   - given more sugar logs - will check fructosamine today - needs a new eye exam - Return to clinic in 3 mo with sugar log   Office Visit on 09/07/2015  Component Date Value Ref Range Status  . Fructosamine 09/07/2015 320* 190 - 270 umol/L Final    HbA1c calculated from fructosamine is 7.0% (stable, at goal).

## 2015-09-07 NOTE — Patient Instructions (Addendum)
Please continue: - Glipizide 10 mg 2x a day - Metformin 1000 mg 2x a day - Januvia 100 mg daily   Please stop at the lab.  Please return in 3-4 months with your sugar log.

## 2015-09-09 LAB — FRUCTOSAMINE: FRUCTOSAMINE: 320 umol/L — AB (ref 190–270)

## 2015-09-16 ENCOUNTER — Encounter: Payer: Self-pay | Admitting: Gastroenterology

## 2015-09-16 DIAGNOSIS — H2513 Age-related nuclear cataract, bilateral: Secondary | ICD-10-CM | POA: Diagnosis not present

## 2015-09-16 DIAGNOSIS — E119 Type 2 diabetes mellitus without complications: Secondary | ICD-10-CM | POA: Diagnosis not present

## 2015-10-16 ENCOUNTER — Telehealth: Payer: Self-pay | Admitting: Cardiovascular Disease

## 2015-10-16 NOTE — Telephone Encounter (Signed)
Pt c/o of Chest Pain: STAT if CP now or developed within 24 hours  1. Are you having CP right now? NO  2. Are you experiencing any other symptoms (ex. SOB, nausea, vomiting, sweating)? SOB  3. How long have you been experiencing CP?3 OR 4 WEEKS  4. Is your CP continuous or coming and going? coming and going  5. Have you taken Nitroglycerin?  NO ?

## 2015-10-16 NOTE — Telephone Encounter (Signed)
Call transferred in from operator stating patient was having CP/SOB at present.  No pain at present but for the last 3 weeks has been having worsening SOB with exertion.  If he rolls the trash can down to the street he feels exhausted.  Also describes episodes of chest pressure.  Says these symptoms can occur together at times and by themselves. He just wants to see if Lauren or Dr Burt Knack feel he needs to come in to be seen.   Call him back on his cell [phone].

## 2015-10-16 NOTE — Telephone Encounter (Signed)
I spoke with the pt and he has been having off and on CP and SOB for the past 3 weeks. The pt has also noticed that he gets tired faster.  The pt denies symptoms on a daily basis.  I advised the pt to avoid exerting himself until he is seen by Dr Burt Knack next week.  I also advised the pt to proceed to ER if he has any worsening or change in symptoms.  Pt agreed with plan.

## 2015-10-21 ENCOUNTER — Ambulatory Visit (INDEPENDENT_AMBULATORY_CARE_PROVIDER_SITE_OTHER): Payer: Medicare Other | Admitting: Cardiovascular Disease

## 2015-10-21 ENCOUNTER — Encounter: Payer: Self-pay | Admitting: Cardiovascular Disease

## 2015-10-21 VITALS — BP 170/111 | HR 82 | Ht 69.0 in | Wt 293.4 lb

## 2015-10-21 DIAGNOSIS — I209 Angina pectoris, unspecified: Secondary | ICD-10-CM | POA: Diagnosis not present

## 2015-10-21 DIAGNOSIS — I2510 Atherosclerotic heart disease of native coronary artery without angina pectoris: Secondary | ICD-10-CM | POA: Diagnosis not present

## 2015-10-21 DIAGNOSIS — I25119 Atherosclerotic heart disease of native coronary artery with unspecified angina pectoris: Secondary | ICD-10-CM | POA: Diagnosis not present

## 2015-10-21 DIAGNOSIS — I5043 Acute on chronic combined systolic (congestive) and diastolic (congestive) heart failure: Secondary | ICD-10-CM

## 2015-10-21 LAB — BASIC METABOLIC PANEL
BUN: 13 mg/dL (ref 7–25)
CHLORIDE: 103 mmol/L (ref 98–110)
CO2: 27 mmol/L (ref 20–31)
Calcium: 9 mg/dL (ref 8.6–10.3)
Creat: 1.03 mg/dL (ref 0.70–1.25)
Glucose, Bld: 93 mg/dL (ref 65–99)
POTASSIUM: 4.3 mmol/L (ref 3.5–5.3)
SODIUM: 139 mmol/L (ref 135–146)

## 2015-10-21 LAB — CBC
HCT: 42.3 % (ref 39.0–52.0)
HEMOGLOBIN: 13.8 g/dL (ref 13.0–17.0)
MCH: 26.6 pg (ref 26.0–34.0)
MCHC: 32.6 g/dL (ref 30.0–36.0)
MCV: 81.5 fL (ref 78.0–100.0)
MPV: 9.7 fL (ref 8.6–12.4)
PLATELETS: 183 10*3/uL (ref 150–400)
RBC: 5.19 MIL/uL (ref 4.22–5.81)
RDW: 15.4 % (ref 11.5–15.5)
WBC: 10.6 10*3/uL — ABNORMAL HIGH (ref 4.0–10.5)

## 2015-10-21 MED ORDER — ISOSORBIDE MONONITRATE ER 30 MG PO TB24: 30.0000 mg | ORAL_TABLET | Freq: Every day | ORAL | Status: DC

## 2015-10-21 NOTE — Patient Instructions (Addendum)
Medication Instructions:  Your physician has recommended you make the following change in your medication:  1. START Isosorbide MN 67m take one tablet by mouth daily  Labwork: Your physician recommends that you have lab work today: BMP, CBC and PT/INR  Testing/Procedures: Your physician has requested that you have a cardiac catheterization. Cardiac catheterization is used to diagnose and/or treat various heart conditions. Doctors may recommend this procedure for a number of different reasons. The most common reason is to evaluate chest pain. Chest pain can be a symptom of coronary artery disease (CAD), and cardiac catheterization can show whether plaque is narrowing or blocking your heart's arteries. This procedure is also used to evaluate the valves, as well as measure the blood flow and oxygen levels in different parts of your heart. For further information please visit [email or web address] Please follow instruction sheet, as given.  Follow-Up: We will arrange further follow-up after cardiac catheterization.   Any Other Special Instructions Will Be Listed Below (If Applicable).     If you need a refill on your cardiac medications before your next appointment, please call your pharmacy.

## 2015-10-21 NOTE — Progress Notes (Signed)
Cardiology Office Note Date:  10/21/2015   ID:  KIMBERLY COYE, DOB 15-Jan-1946, MRN [id number]  PCP:  Garret Reddish, MD  Cardiologist:  Sherren Mocha, MD    Chief Complaint  Patient presents with  . Follow-up    cp, sob fatigue    History of Present Illness: [name] is a 70 y.o. male who presents for  Follow-up of atrial fibrillation and coronary artery disease. The patient has failed cardioversion but he has tolerated a strategy of rate control and anticoagulation. He's been anticoagulated with Pradaxa. He has a history of stent thrombosis and has been maintained on long-term Plavix.   the patient has not been doing well. He's developed progressive exertional dyspnea and chest pressure. He denies orthopnea or PND. He's been able to lose about 10 pounds through intentional efforts at weight loss. However, he has become severely limited with chest pain and shortness of breath. This is been slowly progressive now over about 2 months. He describes a pressure-like sensation in his upper chest. This is a little bit different than what he has experienced in the past with cardiac pain. Previously his symptoms have been localized in the epigastric region. He's had no lightheadedness, diaphoresis, or near syncope. Symptoms are brought on by physical exertion. He has not had resting chest pain.  Past Medical History  Diagnosis Date  . CORONARY ARTERY DISEASE 04/12/2007    2 stents last in 2006.   Marland Kitchen DIABETES MELLITUS, TYPE II 04/16/2007  . HYPERLIPIDEMIA 04/16/2007    reveal study. not sure of medication  . HYPERTENSION 04/12/2007  . HYPOTHYROIDISM 04/12/2007  . MYOCARDIAL INFARCTION, HX OF 04/12/2007  . OBESITY 06/16/2009  . SLEEP APNEA 04/12/2007    CPAP  . ULCERATIVE COLITIS, LEFT SIDED 11/23/2010  . Ischemic cardiomyopathy     cath 35-40%  . Atrial fibrillation ([place])     initial diagnoses 2012  . B12 deficiency     per patient previously taking shots  . Diverticulosis of colon (without  mention of hemorrhage)     Past Surgical History  Procedure Laterality Date  . Coronary stent placement  2008    LAD   . Cardiac catheterization  10/2002    STENT. 2 stents Dr. Maurene Capes  . Tonsillectomy      Current Outpatient Prescriptions  Medication Sig Dispense Refill  . atorvastatin (LIPITOR) 20 MG tablet Take 1 tablet (20 mg total) by mouth daily. 90 tablet 3  . carvedilol (COREG) 25 MG tablet TAKE ONE TABLET BY MOUTH TWICE A DAY WITH A MEAL 60 tablet 10  . clopidogrel (PLAVIX) 75 MG tablet Take 75 mg by mouth daily.    . diclofenac sodium (VOLTAREN) 1 % GEL Apply 2 g topically 4 (four) times daily as needed (knee pain).    . fish oil-omega-3 fatty acids 1000 MG capsule Take 2 g by mouth daily.     Marland Kitchen glipiZIDE (GLUCOTROL) 10 MG tablet Take 10 mg by mouth 2 (two) times daily before a meal.    . Glucosamine-Chondroitin (OSTEO BI-FLEX REGULAR STRENGTH) 250-200 MG TABS Take 1 tablet by mouth 2 (two) times daily.     Marland Kitchen levothyroxine (SYNTHROID, LEVOTHROID) 50 MCG tablet Take 50 mcg by mouth daily before breakfast.    . mesalamine (APRISO) 0.375 g 24 hr capsule Take 1.5 mg by mouth daily.    . metFORMIN (GLUCOPHAGE) 1000 MG tablet Take 1,000 mg by mouth 2 (two) times daily with a meal.    . olmesartan (BENICAR)  40 MG tablet Take 40 mg by mouth daily.    . potassium chloride SA (K-DUR,KLOR-CON) 20 MEQ tablet Take 20 mEq by mouth daily.    Marland Kitchen PRADAXA 150 MG CAPS capsule TAKE ONE TABLET BY MOUTH TWICE A DAY 60 capsule 9  . sitaGLIPtin (JANUVIA) 100 MG tablet Take 100 mg by mouth daily.    Marland Kitchen spironolactone (ALDACTONE) 25 MG tablet Take 0.5 tablets (12.5 mg total) by mouth daily. 45 tablet 3  . triamcinolone cream (KENALOG) 0.1 % Apply 1 application topically 2 (two) times daily. 30 g 0   No current facility-administered medications for this visit.    Allergies:   Review of patient's allergies indicates no known allergies.   Social History:  The patient  reports that he quit smoking about  48 years ago. His smoking use included Cigarettes. He has a 5 pack-year smoking history. He has never used smokeless tobacco. He reports that he drinks alcohol. He reports that he does not use illicit drugs.   Family History:  The patient's  family history includes Heart attack in his mother; Heart disease in his father; Hypertension in his brother; Obesity in his brother; Stomach cancer in his maternal aunt and paternal grandmother.    ROS:  Please see the history of present illness.  Otherwise, review of systems is positive for  Chest pain, shortness of breath, fatigue.  All other systems are reviewed and negative.    PHYSICAL EXAM: VS:  BP 170/111 mmHg  Pulse 82  Ht 5' 9"  (1.753 m)  Wt 293 lb 6.4 oz (133.085 kg)  BMI 43.31 kg/m2  SpO2 89% , BMI Body mass index is 43.31 kg/(m^2). GEN: Well nourished, well developed, pleasant morbidly obese male in no acute distress HEENT: normal Neck: no JVD, no masses. No carotid bruits Cardiac: irregularly lirregular without murmur or gallop         Respiratory:  clear to auscultation bilaterally, normal work of breathing GI: soft, nontender, nondistended, + BS MS: no deformity or atrophy Ext: no pretibial edema, pedal pulses 2+= bilaterally Skin: warm and dry, no rash Neuro:  Strength and sensation are intact Psych: euthymic mood, full affect  EKG:  EKG is ordered today. The ekg ordered today shows atrial fibrillation with premature aberrantly conducted complexes, HR 91 bpm, nonspecific T wave abnormality  Recent Labs: 06/29/2015: ALT 10; Hemoglobin 13.9; Platelets 176.0; TSH 1.66 08/31/2015: BUN 13; Creat 1.02; Potassium 4.0; Sodium 139   Lipid Panel     Component Value Date/Time   CHOL 96 06/29/2015 1450   TRIG 195.0* 06/29/2015 1450   HDL 59.80 06/29/2015 1450   CHOLHDL 2 06/29/2015 1450   VLDL 39.0 06/29/2015 1450   LDLCALC -3* 06/29/2015 1450   LDLDIRECT 12.8 04/02/2013 0821      Wt Readings from Last 3 Encounters:  10/21/15  293 lb 6.4 oz (133.085 kg)  09/07/15 303 lb 9.6 oz (137.712 kg)  07/08/15 302 lb 6.4 oz (137.168 kg)     Cardiac Studies Reviewed: 2D Echo 08-10-2015: Study Conclusions  - Procedure narrative: Transthoracic echocardiography. Image quality was adequate. Intravenous contrast (Definity) was administered. Limited study. - Left ventricle: The cavity size was normal. There was severe concentric hypertrophy, except the mid to distal anterior wall which is thinned and hyperechoic. Apical thinning is noted. Systolic function was moderately reduced. The estimated ejection fraction was in the range of 35% to 40%. This is anterior, anteroseptal and apical akinesis and thinning -suggestive of prior LAD infarct. The study is  not technically sufficient to allow evaluation of LV diastolic function. - Aortic valve: Trileaflet. Sclerosis without stenosis. There was no regurgitation. - Mitral valve: Mildly thickened leaflets . There was trivial regurgitation. - Left atrium: The atrium was mildly dilated.  Impressions:  - Technically difficult study which was limited. Contrast administered. Compared to the prior study in 07/2015, there appear to be few changes. Definitive LV function may be best obtained by cardiac MRI. There is clearly LAD territory infarct with myocardial thinning, especially at the apex.   ASSESSMENT AND PLAN: 1.   Acute on chronic systolic heart failure: Pt with depressed LV function secondary to underlying ischemic heart disease. Volume status difficult to assess because of morbid obesity. Weight is actually down 10 pounds but dyspnea worse. Will assess filling pressures with cardiac catheterization (see below). Add isosorbide. Otherwise continue current Rx.   2. Chronic atrial fibrillation: continue anticoagulation and rate-control.   3. CAD, native vessel, with CCS III angina, crescendo pattern of symptoms:  He clearly has worsening angina,  now with lower level activities. Isosorbide is added to his medical program. I think cardiac catheterization and possible PCI are indicated. I have reviewed the risks, indications, and alternatives to cardiac catheterization, possible angioplasty, and stenting with the patient. Risks include but are not limited to bleeding, infection, vascular injury, stroke, myocardial infection, arrhythmia, kidney injury, radiation-related injury in the case of prolonged fluoroscopy use, emergency cardiac surgery, and death. The patient understands the risks of serious complication is 1-2 in 7209 with diagnostic cardiac cath and 1-2% or less with angioplasty/stenting.    4. Essential hypertension with congestive heart failure: plan as above. Current medications to be continued.  Current medicines are reviewed with the patient today.  The patient does not have concerns regarding medicines.  Labs/ tests ordered today include:  No orders of the defined types were placed in this encounter.    Disposition:   Cardiac cath as detailed above  Signed, Sherren Mocha, MD  10/21/2015 4:40 PM    [facility] [address], [place], [place]  [address] Phone: [phone]; Fax: [phone]

## 2015-10-22 LAB — PROTIME-INR
INR: 1.57 — ABNORMAL HIGH (ref <1.50)
Prothrombin Time: 19 seconds — ABNORMAL HIGH (ref 11.6–15.2)

## 2015-10-24 ENCOUNTER — Other Ambulatory Visit: Payer: Self-pay | Admitting: Cardiovascular Disease

## 2015-10-24 ENCOUNTER — Encounter (HOSPITAL_COMMUNITY): Payer: Self-pay | Admitting: *Deleted

## 2015-10-24 ENCOUNTER — Inpatient Hospital Stay (HOSPITAL_COMMUNITY)
Admission: EM | Admit: 2015-10-24 | Discharge: 2015-10-29 | DRG: 069 | Disposition: A | Discharge status: Home / Self Care | Payer: Medicare Other | Attending: Internal Medicine | Admitting: Internal Medicine

## 2015-10-24 ENCOUNTER — Inpatient Hospital Stay (HOSPITAL_COMMUNITY): Payer: Medicare Other

## 2015-10-24 ENCOUNTER — Emergency Department (HOSPITAL_COMMUNITY): Payer: Medicare Other

## 2015-10-24 DIAGNOSIS — M1712 Unilateral primary osteoarthritis, left knee: Secondary | ICD-10-CM

## 2015-10-24 DIAGNOSIS — Z6841 Body Mass Index (BMI) 40.0 and over, adult: Secondary | ICD-10-CM

## 2015-10-24 DIAGNOSIS — G459 Transient cerebral ischemic attack, unspecified: Secondary | ICD-10-CM | POA: Diagnosis not present

## 2015-10-24 DIAGNOSIS — I25119 Atherosclerotic heart disease of native coronary artery with unspecified angina pectoris: Secondary | ICD-10-CM | POA: Insufficient documentation

## 2015-10-24 DIAGNOSIS — E669 Obesity, unspecified: Secondary | ICD-10-CM

## 2015-10-24 DIAGNOSIS — I5022 Chronic systolic (congestive) heart failure: Secondary | ICD-10-CM

## 2015-10-24 DIAGNOSIS — R4182 Altered mental status, unspecified: Secondary | ICD-10-CM

## 2015-10-24 DIAGNOSIS — I10 Essential (primary) hypertension: Secondary | ICD-10-CM | POA: Diagnosis present

## 2015-10-24 DIAGNOSIS — I25110 Atherosclerotic heart disease of native coronary artery with unstable angina pectoris: Secondary | ICD-10-CM | POA: Diagnosis not present

## 2015-10-24 DIAGNOSIS — I513 Intracardiac thrombosis, not elsewhere classified: Secondary | ICD-10-CM | POA: Diagnosis present

## 2015-10-24 DIAGNOSIS — G4733 Obstructive sleep apnea (adult) (pediatric): Secondary | ICD-10-CM | POA: Diagnosis present

## 2015-10-24 DIAGNOSIS — I252 Old myocardial infarction: Secondary | ICD-10-CM

## 2015-10-24 DIAGNOSIS — I110 Hypertensive heart disease with heart failure: Secondary | ICD-10-CM | POA: Diagnosis present

## 2015-10-24 DIAGNOSIS — Z7902 Long term (current) use of antithrombotics/antiplatelets: Secondary | ICD-10-CM

## 2015-10-24 DIAGNOSIS — F4489 Other dissociative and conversion disorders: Secondary | ICD-10-CM | POA: Diagnosis not present

## 2015-10-24 DIAGNOSIS — R0602 Shortness of breath: Secondary | ICD-10-CM | POA: Diagnosis not present

## 2015-10-24 DIAGNOSIS — I480 Paroxysmal atrial fibrillation: Secondary | ICD-10-CM | POA: Diagnosis not present

## 2015-10-24 DIAGNOSIS — I482 Chronic atrial fibrillation, unspecified: Secondary | ICD-10-CM

## 2015-10-24 DIAGNOSIS — G451 Carotid artery syndrome (hemispheric): Secondary | ICD-10-CM | POA: Diagnosis not present

## 2015-10-24 DIAGNOSIS — J9601 Acute respiratory failure with hypoxia: Secondary | ICD-10-CM | POA: Diagnosis not present

## 2015-10-24 DIAGNOSIS — E039 Hypothyroidism, unspecified: Secondary | ICD-10-CM | POA: Diagnosis present

## 2015-10-24 DIAGNOSIS — I255 Ischemic cardiomyopathy: Secondary | ICD-10-CM | POA: Diagnosis present

## 2015-10-24 DIAGNOSIS — Z87891 Personal history of nicotine dependence: Secondary | ICD-10-CM

## 2015-10-24 DIAGNOSIS — I5043 Acute on chronic combined systolic (congestive) and diastolic (congestive) heart failure: Secondary | ICD-10-CM | POA: Diagnosis present

## 2015-10-24 DIAGNOSIS — Z8673 Personal history of transient ischemic attack (TIA), and cerebral infarction without residual deficits: Secondary | ICD-10-CM | POA: Diagnosis present

## 2015-10-24 DIAGNOSIS — R202 Paresthesia of skin: Secondary | ICD-10-CM | POA: Diagnosis not present

## 2015-10-24 DIAGNOSIS — I639 Cerebral infarction, unspecified: Secondary | ICD-10-CM | POA: Diagnosis not present

## 2015-10-24 DIAGNOSIS — Z955 Presence of coronary angioplasty implant and graft: Secondary | ICD-10-CM

## 2015-10-24 DIAGNOSIS — I2510 Atherosclerotic heart disease of native coronary artery without angina pectoris: Secondary | ICD-10-CM | POA: Diagnosis present

## 2015-10-24 DIAGNOSIS — Z7982 Long term (current) use of aspirin: Secondary | ICD-10-CM

## 2015-10-24 DIAGNOSIS — E1151 Type 2 diabetes mellitus with diabetic peripheral angiopathy without gangrene: Secondary | ICD-10-CM | POA: Diagnosis present

## 2015-10-24 DIAGNOSIS — I6789 Other cerebrovascular disease: Secondary | ICD-10-CM | POA: Diagnosis not present

## 2015-10-24 DIAGNOSIS — I5023 Acute on chronic systolic (congestive) heart failure: Secondary | ICD-10-CM | POA: Diagnosis not present

## 2015-10-24 DIAGNOSIS — R4781 Slurred speech: Secondary | ICD-10-CM | POA: Diagnosis not present

## 2015-10-24 DIAGNOSIS — I4891 Unspecified atrial fibrillation: Secondary | ICD-10-CM | POA: Diagnosis present

## 2015-10-24 DIAGNOSIS — E785 Hyperlipidemia, unspecified: Secondary | ICD-10-CM | POA: Diagnosis not present

## 2015-10-24 DIAGNOSIS — I6523 Occlusion and stenosis of bilateral carotid arteries: Secondary | ICD-10-CM | POA: Diagnosis not present

## 2015-10-24 DIAGNOSIS — E034 Atrophy of thyroid (acquired): Secondary | ICD-10-CM

## 2015-10-24 DIAGNOSIS — R471 Dysarthria and anarthria: Secondary | ICD-10-CM | POA: Diagnosis present

## 2015-10-24 DIAGNOSIS — K529 Noninfective gastroenteritis and colitis, unspecified: Secondary | ICD-10-CM

## 2015-10-24 DIAGNOSIS — Z7901 Long term (current) use of anticoagulants: Secondary | ICD-10-CM

## 2015-10-24 LAB — COMPREHENSIVE METABOLIC PANEL
ALBUMIN: 4 g/dL (ref 3.5–5.0)
ALT: 11 U/L — ABNORMAL LOW (ref 17–63)
ANION GAP: 9 (ref 5–15)
AST: 15 U/L (ref 15–41)
Alkaline Phosphatase: 75 U/L (ref 38–126)
BILIRUBIN TOTAL: 0.6 mg/dL (ref 0.3–1.2)
BUN: 15 mg/dL (ref 6–20)
CHLORIDE: 105 mmol/L (ref 101–111)
CO2: 27 mmol/L (ref 22–32)
Calcium: 8.7 mg/dL — ABNORMAL LOW (ref 8.9–10.3)
Creatinine, Ser: 0.88 mg/dL (ref 0.61–1.24)
GFR calc Af Amer: >60 mL/min (ref >60)
GFR calc non Af Amer: >60 mL/min (ref >60)
GLUCOSE: 208 mg/dL — AB (ref 65–99)
POTASSIUM: 4 mmol/L (ref 3.5–5.1)
Sodium: 141 mmol/L (ref 135–145)
TOTAL PROTEIN: 6.5 g/dL (ref 6.5–8.1)

## 2015-10-24 LAB — DIFFERENTIAL
BASOS ABS: 0 10*3/uL (ref 0.0–0.1)
BASOS PCT: 0 %
EOS ABS: 0.1 10*3/uL (ref 0.0–0.7)
EOS PCT: 2 %
LYMPHS ABS: 0.8 10*3/uL (ref 0.7–4.0)
Lymphocytes Relative: 10 %
Monocytes Absolute: 0.7 10*3/uL (ref 0.1–1.0)
Monocytes Relative: 8 %
NEUTROS PCT: 80 %
Neutro Abs: 7 10*3/uL (ref 1.7–7.7)

## 2015-10-24 LAB — I-STAT CHEM 8, ED
BUN: 14 mg/dL (ref 6–20)
CREATININE: 0.8 mg/dL (ref 0.61–1.24)
Calcium, Ion: 1.11 mmol/L — ABNORMAL LOW (ref 1.13–1.30)
Chloride: 102 mmol/L (ref 101–111)
GLUCOSE: 207 mg/dL — AB (ref 65–99)
HCT: 41 % (ref 39.0–52.0)
HEMOGLOBIN: 13.9 g/dL (ref 13.0–17.0)
POTASSIUM: 3.9 mmol/L (ref 3.5–5.1)
Sodium: 141 mmol/L (ref 135–145)
TCO2: 27 mmol/L (ref 0–100)

## 2015-10-24 LAB — CBC
HCT: 40.1 % (ref 39.0–52.0)
Hemoglobin: 13 g/dL (ref 13.0–17.0)
MCH: 27.2 pg (ref 26.0–34.0)
MCHC: 32.4 g/dL (ref 30.0–36.0)
MCV: 83.9 fL (ref 78.0–100.0)
PLATELETS: 155 10*3/uL (ref 150–400)
RBC: 4.78 MIL/uL (ref 4.22–5.81)
RDW: 14.1 % (ref 11.5–15.5)
WBC: 8.7 10*3/uL (ref 4.0–10.5)

## 2015-10-24 LAB — PROTIME-INR
INR: 1.96 — AB (ref 0.00–1.49)
PROTHROMBIN TIME: 22.2 s — AB (ref 11.6–15.2)

## 2015-10-24 LAB — CBG MONITORING, ED: GLUCOSE-CAPILLARY: 182 mg/dL — AB (ref 65–99)

## 2015-10-24 LAB — TROPONIN I

## 2015-10-24 LAB — APTT: APTT: 55 s — AB (ref 24–37)

## 2015-10-24 LAB — ETHANOL

## 2015-10-24 MED ORDER — GLIPIZIDE 5 MG PO TABS
10.0000 mg | ORAL_TABLET | Freq: Two times a day (BID) | ORAL | Status: DC
  Administered 2015-10-25 – 2015-10-29 (×8): 10 mg via ORAL
  Filled 2015-10-24 (×8): qty 2

## 2015-10-24 MED ORDER — ATORVASTATIN CALCIUM 20 MG PO TABS
20.0000 mg | ORAL_TABLET | Freq: Every day | ORAL | Status: DC
  Administered 2015-10-25 – 2015-10-29 (×5): 20 mg via ORAL
  Filled 2015-10-24 (×2): qty 2
  Filled 2015-10-24 (×2): qty 1
  Filled 2015-10-24: qty 2

## 2015-10-24 MED ORDER — CARVEDILOL 25 MG PO TABS
25.0000 mg | ORAL_TABLET | Freq: Two times a day (BID) | ORAL | Status: DC
  Administered 2015-10-24 – 2015-10-29 (×10): 25 mg via ORAL
  Filled 2015-10-24 (×6): qty 2
  Filled 2015-10-24 (×2): qty 1
  Filled 2015-10-24 (×2): qty 2

## 2015-10-24 MED ORDER — INSULIN ASPART 100 UNIT/ML ~~LOC~~ SOLN
0.0000 [IU] | Freq: Three times a day (TID) | SUBCUTANEOUS | Status: DC
  Administered 2015-10-26: 2 [IU] via SUBCUTANEOUS
  Administered 2015-10-26: 3 [IU] via SUBCUTANEOUS
  Administered 2015-10-26 – 2015-10-27 (×2): 2 [IU] via SUBCUTANEOUS
  Administered 2015-10-27: 3 [IU] via SUBCUTANEOUS
  Administered 2015-10-28: 2 [IU] via SUBCUTANEOUS
  Administered 2015-10-28: 3 [IU] via SUBCUTANEOUS

## 2015-10-24 MED ORDER — INSULIN ASPART 100 UNIT/ML ~~LOC~~ SOLN: 0.0000 [IU] | Freq: Every day | SUBCUTANEOUS | Status: DC

## 2015-10-24 MED ORDER — LINAGLIPTIN 5 MG PO TABS
5.0000 mg | ORAL_TABLET | Freq: Every day | ORAL | Status: DC
  Administered 2015-10-24 – 2015-10-29 (×6): 5 mg via ORAL
  Filled 2015-10-24 (×5): qty 1

## 2015-10-24 MED ORDER — LEVOTHYROXINE SODIUM 50 MCG PO TABS
50.0000 ug | ORAL_TABLET | Freq: Every day | ORAL | Status: DC
  Administered 2015-10-25 – 2015-10-29 (×6): 50 ug via ORAL
  Filled 2015-10-24 (×6): qty 1

## 2015-10-24 MED ORDER — ISOSORBIDE MONONITRATE ER 30 MG PO TB24
30.0000 mg | ORAL_TABLET | Freq: Every day | ORAL | Status: DC
  Administered 2015-10-25 – 2015-10-28 (×4): 30 mg via ORAL
  Filled 2015-10-24 (×4): qty 1

## 2015-10-24 MED ORDER — CLOPIDOGREL BISULFATE 75 MG PO TABS
75.0000 mg | ORAL_TABLET | Freq: Every day | ORAL | Status: DC
  Administered 2015-10-25 – 2015-10-29 (×5): 75 mg via ORAL
  Filled 2015-10-24 (×5): qty 1

## 2015-10-24 MED ORDER — IRBESARTAN 150 MG PO TABS
300.0000 mg | ORAL_TABLET | Freq: Every day | ORAL | Status: DC
  Administered 2015-10-25 – 2015-10-29 (×5): 300 mg via ORAL
  Filled 2015-10-24: qty 1
  Filled 2015-10-24: qty 2
  Filled 2015-10-24 (×3): qty 1

## 2015-10-24 MED ORDER — POTASSIUM CHLORIDE CRYS ER 20 MEQ PO TBCR
20.0000 meq | EXTENDED_RELEASE_TABLET | Freq: Every day | ORAL | Status: DC
  Administered 2015-10-25 – 2015-10-29 (×5): 20 meq via ORAL
  Filled 2015-10-24 (×5): qty 1

## 2015-10-24 MED ORDER — DABIGATRAN ETEXILATE MESYLATE 150 MG PO CAPS
150.0000 mg | ORAL_CAPSULE | Freq: Two times a day (BID) | ORAL | Status: DC
  Administered 2015-10-25 – 2015-10-26 (×3): 150 mg via ORAL
  Filled 2015-10-24 (×5): qty 1

## 2015-10-24 MED ORDER — METFORMIN HCL 500 MG PO TABS: 1000.0000 mg | ORAL_TABLET | Freq: Two times a day (BID) | ORAL | Status: DC

## 2015-10-24 MED ORDER — ONDANSETRON HCL 4 MG/2ML IJ SOLN
4.0000 mg | Freq: Three times a day (TID) | INTRAMUSCULAR | Status: AC | PRN
  Administered 2015-10-24: 4 mg via INTRAVENOUS
  Filled 2015-10-24: qty 2

## 2015-10-24 MED ORDER — MESALAMINE ER 0.375 G PO CP24: 1.5000 g | ORAL_CAPSULE | Freq: Every day | ORAL | Status: DC

## 2015-10-24 MED ORDER — SENNOSIDES-DOCUSATE SODIUM 8.6-50 MG PO TABS: 1.0000 | ORAL_TABLET | Freq: Every evening | ORAL | Status: DC | PRN

## 2015-10-24 MED ORDER — FUROSEMIDE 10 MG/ML IJ SOLN
20.0000 mg | Freq: Once | INTRAMUSCULAR | Status: AC
  Administered 2015-10-24: 20 mg via INTRAVENOUS
  Filled 2015-10-24: qty 2

## 2015-10-24 MED ORDER — ASPIRIN EC 325 MG PO TBEC
325.0000 mg | DELAYED_RELEASE_TABLET | Freq: Once | ORAL | Status: AC
Start: 2015-10-24 — End: 2015-10-24
  Administered 2015-10-24: 325 mg via ORAL
  Filled 2015-10-24: qty 1

## 2015-10-24 NOTE — ED Notes (Signed)
Pt with confusion, slurred speech at 1813 per report [facility]

## 2015-10-24 NOTE — H&P (Signed)
History and Physical  [name]:[id number] DOB: Jan 19, 1946 DOA: 10/24/2015  Referring physician: Dr Sabra Heck, ED physician PCP: Garret Reddish, MD   Chief Complaint: Dysarthria  HPI: [name] is a 70 y.o. male  With a history of coronary artery disease with stenting in 2006 and on Plavix, diabetes type 2 with circulatory complications, hyperlipidemia, hypertension, hypothyroidism, history of myocardial infarct, obstructive sleep apnea on C Pap, atrial fibrillation on Pradaxa, ulcerative colitis, ischemic cardiomyopathy with LVEF of 35-40% on echocardiogram on 08/20/15. Patient was brought to emergency department by EMS due to onset of dysarthria and problems with coordination that started at about 5:30 today. The patient reports having nausea for the majority of the day with desire just to sit down all day. This evening, the patient was watching TV and suddenly had onset of difficulty utilizing the remote for the television with the addition of dysarthria and difficulty forming sentences. His wife was sitting next to him and noted the symptoms. The patient went to the bathroom and continued to have difficulty when he returned a short time later. EMS was called and the patient was brought to the hospital for evaluation. By the time the patient arrives at the hospital, his symptoms had improved. No palliating or provoking factors noted. Reports no headaches, blurred vision, peripheral or focal weaknesses, slurred speech.   Of note,  the patient was recently seen by cardiology on 1/11 due to continued angina. The patient was put on Imdur and now reports that his angina has improved dramatically. He reports no chest pain, shortness of breath, abdominal pain.   Review of Systems:   Pt complains of Throbbing behind his left eye.  Pt denies any fevers, chills, nausea, vomiting, diarrhea, constipation, abdominal pain, shortness of breath, dyspnea on exertion, orthopnea, cough, wheezing,  palpitations, headache, vision changes, lightheadedness, dizziness, diarrhea, constipation, melena, rectal bleeding.  Review of systems are otherwise negative  Past Medical History  Diagnosis Date  . CORONARY ARTERY DISEASE 04/12/2007    2 stents last in 2006.   Marland Kitchen DIABETES MELLITUS, TYPE II 04/16/2007  . HYPERLIPIDEMIA 04/16/2007    reveal study. not sure of medication  . HYPERTENSION 04/12/2007  . HYPOTHYROIDISM 04/12/2007  . MYOCARDIAL INFARCTION, HX OF 04/12/2007  . OBESITY 06/16/2009  . SLEEP APNEA 04/12/2007    CPAP  . ULCERATIVE COLITIS, LEFT SIDED 11/23/2010  . Ischemic cardiomyopathy     cath 35-40%  . Atrial fibrillation ([place])     initial diagnoses 2012  . B12 deficiency     per patient previously taking shots  . Diverticulosis of colon (without mention of hemorrhage)    Past Surgical History  Procedure Laterality Date  . Coronary stent placement  2008    LAD   . Cardiac catheterization  10/2002    STENT. 2 stents Dr. Maurene Capes  . Tonsillectomy     Social History:  reports that he quit smoking about 48 years ago. His smoking use included Cigarettes. He has a 5 pack-year smoking history. He has never used smokeless tobacco. He reports that he drinks alcohol. He reports that he does not use illicit drugs. Patient lives at home & is able to participate in activities of daily living  No Known Allergies  Family History  Problem Relation Age of Onset  . Heart attack Mother     mid 71s  . Heart disease Father     H/O CAD, CABG, VALVE SURGERY  . Hypertension Brother   . Obesity Brother   .  Stomach cancer Maternal Aunt   . Stomach cancer Paternal Grandmother     ? colon      Prior to Admission medications   Medication Sig Start Date End Date Taking? Authorizing Provider  aspirin EC 81 MG tablet Take 81 mg by mouth once.   Yes Historical Provider, MD  atorvastatin (LIPITOR) 20 MG tablet Take 1 tablet (20 mg total) by mouth daily. 08/04/15  Yes Sherren Mocha, MD  carvedilol (COREG) 25  MG tablet TAKE ONE TABLET BY MOUTH TWICE A DAY WITH A MEAL 09/01/15  Yes Sherren Mocha, MD  clopidogrel (PLAVIX) 75 MG tablet Take 75 mg by mouth daily.   Yes Historical Provider, MD  fish oil-omega-3 fatty acids 1000 MG capsule Take 2 g by mouth daily.    Yes Historical Provider, MD  glipiZIDE (GLUCOTROL) 10 MG tablet Take 10 mg by mouth 2 (two) times daily before a meal.   Yes Historical Provider, MD  Glucosamine-Chondroitin (OSTEO BI-FLEX REGULAR STRENGTH) 250-200 MG TABS Take 1 tablet by mouth 2 (two) times daily.    Yes Historical Provider, MD  isosorbide mononitrate (IMDUR) 30 MG 24 hr tablet Take 1 tablet (30 mg total) by mouth daily. 10/21/15  Yes Sherren Mocha, MD  levothyroxine (SYNTHROID, LEVOTHROID) 50 MCG tablet Take 50 mcg by mouth daily before breakfast.   Yes Historical Provider, MD  mesalamine (APRISO) 0.375 g 24 hr capsule Take 1.5 mg by mouth daily.   Yes Historical Provider, MD  metFORMIN (GLUCOPHAGE) 1000 MG tablet Take 1,000 mg by mouth 2 (two) times daily with a meal.   Yes Historical Provider, MD  olmesartan (BENICAR) 40 MG tablet Take 40 mg by mouth daily.   Yes Historical Provider, MD  potassium chloride SA (K-DUR,KLOR-CON) 20 MEQ tablet Take 20 mEq by mouth daily.   Yes Historical Provider, MD  PRADAXA 150 MG CAPS capsule TAKE ONE TABLET BY MOUTH TWICE A DAY 08/26/15  Yes Sherren Mocha, MD  sitaGLIPtin (JANUVIA) 100 MG tablet Take 100 mg by mouth daily.   Yes Historical Provider, MD  triamcinolone cream (KENALOG) 0.1 % Apply 1 application topically 2 (two) times daily. 06/29/15  Yes Marin Olp, MD  diclofenac sodium (VOLTAREN) 1 % GEL Apply 2 g topically 4 (four) times daily as needed (knee pain).    Historical Provider, MD  spironolactone (ALDACTONE) 25 MG tablet Take 0.5 tablets (12.5 mg total) by mouth daily. 08/20/15   Sherren Mocha, MD    Physical Exam: BP 182/94 mmHg  Pulse 68  Temp(Src) 97.6 F (36.4 C) (Oral)  Resp 23  Ht 5' 8"  (1.727 m)  Wt 132.45  kg (292 lb)  BMI 44.41 kg/m2  SpO2 94%  General:  very pleasant older Caucasian gentleman. Awake and alert and oriented x3. No acute cardiopulmonary distress.  Eyes: Pupils equal, round, reactive to light. Extraocular muscles are intact. Sclerae anicteric and noninjected.  ENT:  Moist mucosal membranes. No mucosal lesions.  Neck: Neck supple without lymphadenopathy. No carotid bruits. No masses palpated.  Cardiovascular: Regular rate with normal S1-S2 sounds. No murmurs, rubs, gallops auscultated. No JVD.  Respiratory: Good respiratory effort with no wheezes, rales, rhonchi. Lungs clear to auscultation bilaterally.  Abdomen:  obese. Soft, nontender, nondistended. Active bowel sounds. No masses or hepatosplenomegaly  Skin: Dry, warm to touch. 2+ dorsalis pedis and radial pulses. Musculoskeletal: No calf or leg pain. All major joints not erythematous nontender.  Psychiatric: Intact judgment and insight.  Neurologic: No focal neurological deficits. coordination intact. Strength intact.  Cranial nerves II through XII are grossly intact.           Labs on Admission:  Basic Metabolic Panel:  Recent Labs Lab 10/21/15 1701 10/24/15 1917 10/24/15 1943  NA 139 141 141  K 4.3 4.0 3.9  CL 103 105 102  CO2 27 27  --   GLUCOSE 93 208* 207*  BUN 13 15 14   CREATININE 1.03 0.88 0.80  CALCIUM 9.0 8.7*  --    Liver Function Tests:  Recent Labs Lab 10/24/15 1917  AST 15  ALT 11*  ALKPHOS 75  BILITOT 0.6  PROT 6.5  ALBUMIN 4.0   No results for input(s): LIPASE, AMYLASE in the last 168 hours. No results for input(s): AMMONIA in the last 168 hours. CBC:  Recent Labs Lab 10/21/15 1701 10/24/15 1917 10/24/15 1943  WBC 10.6* 8.7  --   NEUTROABS  --  7.0  --   HGB 13.8 13.0 13.9  HCT 42.3 40.1 41.0  MCV 81.5 83.9  --   PLT 183 155  --    Cardiac Enzymes:  Recent Labs Lab 10/24/15 1917  TROPONINI <0.03    BNP (last 3 results) No results for input(s): BNP in the last 8760  hours.  ProBNP (last 3 results) No results for input(s): PROBNP in the last 8760 hours.  CBG: No results for input(s): GLUCAP in the last 168 hours.  Radiological Exams on Admission: Ct Head Wo Contrast  10/24/2015  CLINICAL DATA:  Acute onset of confusion and slurred speech. Left-sided headache. Initial encounter. EXAM: CT HEAD WITHOUT CONTRAST TECHNIQUE: Contiguous axial images were obtained from the base of the skull through the vertex without intravenous contrast. COMPARISON:  None. FINDINGS: There is no evidence of acute infarction, mass lesion, or intra- or extra-axial hemorrhage on CT. Prominence of the ventricles and sulci reflects mild to moderate cortical volume loss. Scattered periventricular and subcortical white matter change likely reflects small vessel ischemic microangiopathy. Small chronic lacunar infarcts are noted at the basal ganglia bilaterally. Cerebellar atrophy is noted. The brainstem and fourth ventricle are within normal limits. The cerebral hemispheres demonstrate grossly normal gray-white differentiation. No mass effect or midline shift is seen. There is no evidence of fracture; visualized osseous structures are unremarkable in appearance. The visualized portions of the orbits are within normal limits. The paranasal sinuses and mastoid air cells are well-aerated. No significant soft tissue abnormalities are seen. IMPRESSION: 1. No acute intracranial pathology seen on CT. 2. Mild to moderate cortical volume loss and scattered small vessel ischemic microangiopathy. 3. Small chronic lacunar infarcts at the basal ganglia bilaterally. Electronically Signed   By: Garald Balding M.D.   On: 10/24/2015 19:35    EKG: Independently reviewed. Atrial fibrillation with rate of 81. RSR prime in V2. Possible RVH. No acute ST changes.  Assessment/Plan Present on Admission:  . TIA (transient ischemic attack) . Type II diabetes mellitus with peripheral circulatory disorder (HCC) .  Essential hypertension . Atrial fibrillation ([place]) . OSA (obstructive sleep apnea)  This patient was discussed with the ED physician, including pertinent vitals, physical exam findings, labs, and imaging.  We also discussed care given by the ED provider.  #1 TIA  Admitted to [facility] on neuro telemetry. Will transfer the patient as soon as one becomes available.  Start aspirin 325 mg daily  Continue Plavix  MRI/MRA in the morning  Carotid ultrasound  Echocardiogram  Will obtain fasting lipids and hemoglobin A1c in the morning  #2 diabetes type 2  hemoglobin A1c in the morning  CBGs before meals and daily at bedtime  Continue home medications: Metformin, glyburide, Januvia  Sliding scale insulin  #3 essential hypertension   elevated blood pressures here in the ER  As the symptoms have resolved, will more aggressively treat his hypertension  We'll treat with home medications: Carvedilol tonight  #4 atrial fibrillation   currently rate controlled area did  Continue Pradaxa #5 subjective sleep apnea  See At night  #6 Coronary artery disease    continue Lipitor, Lasix, ASA  DVT prophylaxis:  on Pradaxa  Consultants: None Code Status: Full code  Family Communication: wife, daughter, great-granddaughter in the room and pertussis. Care   Disposition Plan: Admission to [facility] on telemetry  Truett Mainland, DO [facility] Pager [phone]

## 2015-10-24 NOTE — ED Notes (Signed)
Contacted [place] @ [phone]. Dr Sabra Heck canceled Code Stroke @ 1918.

## 2015-10-24 NOTE — ED Notes (Signed)
MD at bedside. 

## 2015-10-24 NOTE — ED Provider Notes (Signed)
CSN: [id number]     Arrival date & time 10/24/15  1912 History   First MD Initiated Contact with Patient 10/24/15 1915     Chief Complaint  Patient presents with  . Code Stroke     (Consider location/radiation/quality/duration/timing/severity/associated sxs/prior Treatment) HPI Comments: The pt is an obese male with hx of CAD (stents in 2006), MI, DM, Htn, lipids, who presents to the ED b/c his wife felt that something was not right at home today - it is unclear exactly what that is b/c the wife is not here on pt arrival.  EMS reports that there was some confusion throughout the day today but is not able to tell us what that is - they report to me that he has been able to walk, talk and explain answers to them occasionally becoming confused but not having slurred speech or lateralizing symtpoms.  The pt states that when he ws using the controller to try to change the stations on the TV today he was having some difficulty noting that he was only hearing "fuzz" - he gave the controller to his wife to control - he has no weakness, numbness or difficulty with balance according to his report - EMS noted that initially he was hypertensive but that resolved en route.  There is no clear onset of sx and sx are vague and not lateralizing.  The history is provided by the patient and the EMS personnel.    Past Medical History  Diagnosis Date  . CORONARY ARTERY DISEASE 04/12/2007    2 stents last in 2006.   Marland Kitchen DIABETES MELLITUS, TYPE II 04/16/2007  . HYPERLIPIDEMIA 04/16/2007    reveal study. not sure of medication  . HYPERTENSION 04/12/2007  . HYPOTHYROIDISM 04/12/2007  . MYOCARDIAL INFARCTION, HX OF 04/12/2007  . OBESITY 06/16/2009  . SLEEP APNEA 04/12/2007    CPAP  . ULCERATIVE COLITIS, LEFT SIDED 11/23/2010  . Ischemic cardiomyopathy     cath 35-40%  . Atrial fibrillation ([place])     initial diagnoses 2012  . B12 deficiency     per patient previously taking shots  . Diverticulosis of colon (without mention of  hemorrhage)    Past Surgical History  Procedure Laterality Date  . Coronary stent placement  2008    LAD   . Cardiac catheterization  10/2002    STENT. 2 stents Dr. Maurene Capes  . Tonsillectomy     Family History  Problem Relation Age of Onset  . Heart attack Mother     mid 38s  . Heart disease Father     H/O CAD, CABG, VALVE SURGERY  . Hypertension Brother   . Obesity Brother   . Stomach cancer Maternal Aunt   . Stomach cancer Paternal Grandmother     ? colon    Social History  Substance Use Topics  . Smoking status: Former Smoker -- 1.00 packs/day for 5 years    Types: Cigarettes    Quit date: 04/14/1967  . Smokeless tobacco: Never Used  . Alcohol Use: Yes     Comment: social, beer once a month or two - light    Review of Systems  All other systems reviewed and are negative.     Allergies  Review of patient's allergies indicates no known allergies.  Home Medications   Prior to Admission medications   Medication Sig Start Date End Date Taking? Authorizing Provider  aspirin EC 81 MG tablet Take 81 mg by mouth once.   Yes Historical Provider, MD  atorvastatin (LIPITOR) 20 MG tablet Take 1 tablet (20 mg total) by mouth daily. 08/04/15  Yes Sherren Mocha, MD  carvedilol (COREG) 25 MG tablet TAKE ONE TABLET BY MOUTH TWICE A DAY WITH A MEAL 09/01/15  Yes Sherren Mocha, MD  clopidogrel (PLAVIX) 75 MG tablet Take 75 mg by mouth daily.   Yes Historical Provider, MD  fish oil-omega-3 fatty acids 1000 MG capsule Take 2 g by mouth daily.    Yes Historical Provider, MD  glipiZIDE (GLUCOTROL) 10 MG tablet Take 10 mg by mouth 2 (two) times daily before a meal.   Yes Historical Provider, MD  Glucosamine-Chondroitin (OSTEO BI-FLEX REGULAR STRENGTH) 250-200 MG TABS Take 1 tablet by mouth 2 (two) times daily.    Yes Historical Provider, MD  isosorbide mononitrate (IMDUR) 30 MG 24 hr tablet Take 1 tablet (30 mg total) by mouth daily. 10/21/15  Yes Sherren Mocha, MD  levothyroxine  (SYNTHROID, LEVOTHROID) 50 MCG tablet Take 50 mcg by mouth daily before breakfast.   Yes Historical Provider, MD  mesalamine (APRISO) 0.375 g 24 hr capsule Take 1.5 mg by mouth daily.   Yes Historical Provider, MD  metFORMIN (GLUCOPHAGE) 1000 MG tablet Take 1,000 mg by mouth 2 (two) times daily with a meal.   Yes Historical Provider, MD  olmesartan (BENICAR) 40 MG tablet Take 40 mg by mouth daily.   Yes Historical Provider, MD  potassium chloride SA (K-DUR,KLOR-CON) 20 MEQ tablet Take 20 mEq by mouth daily.   Yes Historical Provider, MD  PRADAXA 150 MG CAPS capsule TAKE ONE TABLET BY MOUTH TWICE A DAY 08/26/15  Yes Sherren Mocha, MD  sitaGLIPtin (JANUVIA) 100 MG tablet Take 100 mg by mouth daily.   Yes Historical Provider, MD  triamcinolone cream (KENALOG) 0.1 % Apply 1 application topically 2 (two) times daily. 06/29/15  Yes Marin Olp, MD  diclofenac sodium (VOLTAREN) 1 % GEL Apply 2 g topically 4 (four) times daily as needed (knee pain).    Historical Provider, MD  spironolactone (ALDACTONE) 25 MG tablet Take 0.5 tablets (12.5 mg total) by mouth daily. 08/20/15   Sherren Mocha, MD   BP 182/94 mmHg  Pulse 68  Temp(Src) 97.6 F (36.4 C) (Oral)  Resp 23  Ht 5' 8"  (1.727 m)  Wt 292 lb (132.45 kg)  BMI 44.41 kg/m2  SpO2 94% Physical Exam  Constitutional: He appears well-developed and well-nourished. No distress.  HENT:  Head: Normocephalic and atraumatic.  Mouth/Throat: Oropharynx is clear and moist. No oropharyngeal exudate.  Eyes: Conjunctivae and EOM are normal. Pupils are equal, round, and reactive to light. Right eye exhibits no discharge. Left eye exhibits no discharge. No scleral icterus.  Neck: Normal range of motion. Neck supple. No JVD present. No thyromegaly present.  Cardiovascular: Normal heart sounds and intact distal pulses.  Exam reveals no gallop and no friction rub.   No murmur heard. afib present, strong pulses, no carotid bruits  Pulmonary/Chest: Effort normal  and breath sounds normal. No respiratory distress. He has no wheezes. He has no rales.  Abdominal: Soft. Bowel sounds are normal. He exhibits no distension and no mass. There is no tenderness.  Musculoskeletal: Normal range of motion. He exhibits edema ( scant bilateral symmetrical edema). He exhibits no tenderness.  Lymphadenopathy:    He has no cervical adenopathy.  Neurological: He is alert. Coordination normal.  Neurologic exam:  Speech clear, pupils equal round reactive to light, extraocular movements intact  Normal peripheral visual fields Cranial nerves III through XII normal  including no facial droop Follows commands, moves all extremities x4, normal strength to bilateral upper and lower extremities at all major muscle groups including grip Sensation normal to light touch and pinprick Coordination intact, no limb ataxia, finger-nose-finger normal Rapid alternating movements normal No pronator drift  He is able to transfer himself from the EMS stretcher to the gurney in the ER. His speech is clear, memory is intact, otherwise appears totally normal from a neurologic perspective. He answers all my questions appropriately without confusion or disorientation  Skin: Skin is warm and dry. No rash noted. No erythema.  Psychiatric: He has a normal mood and affect. His behavior is normal.  Nursing note and vitals reviewed.   ED Course  Procedures (including critical care time) Labs Review Labs Reviewed  PROTIME-INR - Abnormal; Notable for the following:    Prothrombin Time 22.2 (*)    INR 1.96 (*)    All other components within normal limits  APTT - Abnormal; Notable for the following:    aPTT 55 (*)    All other components within normal limits  COMPREHENSIVE METABOLIC PANEL - Abnormal; Notable for the following:    Glucose, Bld 208 (*)    Calcium 8.7 (*)    ALT 11 (*)    All other components within normal limits  I-STAT CHEM 8, ED - Abnormal; Notable for the following:     Glucose, Bld 207 (*)    Calcium, Ion 1.11 (*)    All other components within normal limits  ETHANOL  CBC  DIFFERENTIAL  TROPONIN I  URINE RAPID DRUG SCREEN, HOSP PERFORMED  URINALYSIS, ROUTINE W REFLEX MICROSCOPIC (NOT AT [facility])    Imaging Review Ct Head Wo Contrast  10/24/2015  CLINICAL DATA:  Acute onset of confusion and slurred speech. Left-sided headache. Initial encounter. EXAM: CT HEAD WITHOUT CONTRAST TECHNIQUE: Contiguous axial images were obtained from the base of the skull through the vertex without intravenous contrast. COMPARISON:  None. FINDINGS: There is no evidence of acute infarction, mass lesion, or intra- or extra-axial hemorrhage on CT. Prominence of the ventricles and sulci reflects mild to moderate cortical volume loss. Scattered periventricular and subcortical white matter change likely reflects small vessel ischemic microangiopathy. Small chronic lacunar infarcts are noted at the basal ganglia bilaterally. Cerebellar atrophy is noted. The brainstem and fourth ventricle are within normal limits. The cerebral hemispheres demonstrate grossly normal gray-white differentiation. No mass effect or midline shift is seen. There is no evidence of fracture; visualized osseous structures are unremarkable in appearance. The visualized portions of the orbits are within normal limits. The paranasal sinuses and mastoid air cells are well-aerated. No significant soft tissue abnormalities are seen. IMPRESSION: 1. No acute intracranial pathology seen on CT. 2. Mild to moderate cortical volume loss and scattered small vessel ischemic microangiopathy. 3. Small chronic lacunar infarcts at the basal ganglia bilaterally. Electronically Signed   By: Garald Balding M.D.   On: 10/24/2015 19:35   I have personally reviewed and evaluated these images and lab results as part of my medical decision-making.   EKG Interpretation   Date/Time:  Saturday October 24 2015 19:16:14 EST Ventricular Rate:  81 PR  Interval:    QRS Duration: 101 QT Interval:  393 QTC Calculation: 456 R Axis:   -9 Text Interpretation:  Atrial fibrillation Multiple ventricular premature  complexes RSR' in V1 or V2, right VCD or RVH Probable LVH with secondary  repol abnrm since last tracing no significant change Confirmed by Sabra Heck   MD,  Chelsei Mcchesney ([id number]) on 10/24/2015 7:26:51 PM      MDM   Final diagnoses:  Transient cerebral ischemia, unspecified transient cerebral ischemia type    At this time the patient does appear to have atrial fibrillation, he is anticoagulated on Plavix and Pradaxa. He has multiple comorbidities which would predispose him to stroke however at this time I see no signs of stroke, no lateralizing symptoms, no difficulty with speech or memory or orientation. Code stroke was initially considered however this was canceled once the patient arrived with an essentially normal neurologic exam. His wife is in route and hopefully will have further information. We'll perform frequent neurologic checks, CT scan of the brain and labs. Possible TIA,  The patient's spouse has now arrived and states that he was having difficulty using the remote control to the TV, he was unable to get it to work correctly, she could not tell if it was because he didn't understood how it worked or he was unable to use his hand, he walked to the bathroom and when he came back he was unable to speak as he was garbled speech making nonsensical words. This has all resolved at this point, she and another family member are at the bedside stating that he appears back to his normal self. He is scheduled for a heart catheterization early this week as he has been having some exertional symptoms  CT and labs reviewed - no acute findings - d/w Dr. Nehemiah Settle who will facilitate admission.    Will transfer to [facility] orders written for Obs Tele - TIA w/u.  Noemi Chapel, MD 10/24/15 2115

## 2015-10-24 NOTE — ED Notes (Signed)
Pt became SOb with exertion while switching beds, pt stated that it was not unusual for him and has been having sob with exertion here lately.  Granddaughter requesting an x-ray.  RA sats 90-91% and O2 applied via [address] at 2 L/M

## 2015-10-24 NOTE — Progress Notes (Signed)
Called to RN that patient became short of breath on exertion while switching beds. Patient has history of congestive heart failure, and says he takes Lasix 40 mg daily at home. O2 sats on room air 90-91%, now started on oxygen via nasal cannula at 2 L/m.  On exam Faint crackles at lung bases  Assessment Mild CHF exacerbation  Plan Check BNP, chest x-ray portable, Lasix 20 mg  IV 1 Follow 2-D echocardiogram in a.m.

## 2015-10-25 ENCOUNTER — Inpatient Hospital Stay (HOSPITAL_COMMUNITY): Payer: Medicare Other

## 2015-10-25 DIAGNOSIS — I10 Essential (primary) hypertension: Secondary | ICD-10-CM

## 2015-10-25 DIAGNOSIS — G459 Transient cerebral ischemic attack, unspecified: Secondary | ICD-10-CM

## 2015-10-25 DIAGNOSIS — I5023 Acute on chronic systolic (congestive) heart failure: Secondary | ICD-10-CM | POA: Diagnosis present

## 2015-10-25 DIAGNOSIS — J9601 Acute respiratory failure with hypoxia: Secondary | ICD-10-CM | POA: Diagnosis present

## 2015-10-25 DIAGNOSIS — I482 Chronic atrial fibrillation: Secondary | ICD-10-CM

## 2015-10-25 DIAGNOSIS — E1151 Type 2 diabetes mellitus with diabetic peripheral angiopathy without gangrene: Secondary | ICD-10-CM

## 2015-10-25 LAB — LIPID PANEL
CHOL/HDL RATIO: 1.7 ratio
Cholesterol: 79 mg/dL (ref 0–200)
HDL: 47 mg/dL (ref >40)
LDL CALC: 16 mg/dL (ref 0–99)
Triglycerides: 79 mg/dL (ref <150)
VLDL: 16 mg/dL (ref 0–40)

## 2015-10-25 LAB — GLUCOSE, CAPILLARY
GLUCOSE-CAPILLARY: 178 mg/dL — AB (ref 65–99)
Glucose-Capillary: 178 mg/dL — ABNORMAL HIGH (ref 65–99)

## 2015-10-25 LAB — CBG MONITORING, ED: Glucose-Capillary: 137 mg/dL — ABNORMAL HIGH (ref 65–99)

## 2015-10-25 LAB — BRAIN NATRIURETIC PEPTIDE: B NATRIURETIC PEPTIDE 5: 157 pg/mL — AB (ref 0.0–100.0)

## 2015-10-25 MED ORDER — HYDRALAZINE HCL 25 MG PO TABS: 25.0000 mg | ORAL_TABLET | Freq: Once | ORAL | Status: DC

## 2015-10-25 MED ORDER — SPIRONOLACTONE 25 MG PO TABS
12.5000 mg | ORAL_TABLET | Freq: Every day | ORAL | Status: DC
  Administered 2015-10-25 – 2015-10-28 (×4): 12.5 mg via ORAL
  Filled 2015-10-25 (×5): qty 1

## 2015-10-25 MED ORDER — ONDANSETRON HCL 4 MG/2ML IJ SOLN
4.0000 mg | Freq: Four times a day (QID) | INTRAMUSCULAR | Status: DC | PRN
  Administered 2015-10-25 – 2015-10-28 (×2): 4 mg via INTRAVENOUS
  Filled 2015-10-25: qty 2

## 2015-10-25 MED ORDER — STROKE: EARLY STAGES OF RECOVERY BOOK
Freq: Once | Status: AC
  Administered 2015-10-25: 12:00:00
  Filled 2015-10-25: qty 1

## 2015-10-25 MED ORDER — HYDRALAZINE HCL 25 MG PO TABS
ORAL_TABLET | ORAL | Status: AC
  Filled 2015-10-25: qty 1

## 2015-10-25 MED ORDER — ONDANSETRON HCL 4 MG/2ML IJ SOLN
INTRAMUSCULAR | Status: AC
  Filled 2015-10-25: qty 2

## 2015-10-25 MED ORDER — HYDROCODONE-ACETAMINOPHEN 5-325 MG PO TABS
1.0000 | ORAL_TABLET | Freq: Four times a day (QID) | ORAL | Status: DC | PRN
Start: 2015-10-25 — End: 2015-10-29

## 2015-10-25 MED ORDER — PERFLUTREN LIPID MICROSPHERE
1.0000 mL | INTRAVENOUS | Status: AC | PRN
  Filled 2015-10-25: qty 10

## 2015-10-25 MED ORDER — FUROSEMIDE 10 MG/ML IJ SOLN
20.0000 mg | Freq: Two times a day (BID) | INTRAMUSCULAR | Status: DC
  Administered 2015-10-25 – 2015-10-27 (×4): 20 mg via INTRAVENOUS
  Filled 2015-10-25 (×2): qty 4
  Filled 2015-10-25: qty 2
  Filled 2015-10-25: qty 4

## 2015-10-25 NOTE — Progress Notes (Signed)
Called by RN to evaluate patient for neurological changes as per family. Patient only intermittent with TIA, waiting to be transferred to [facility] for MRI brain.  Patient complains of nausea and headache. He is alert and oriented 3, answers all questions appropriately. No slurred speech. No neurological deficit noted at this time.  Vitals are stable. Blood pressure 164/87  Assessment Nausea, headache  Likely from persistent hypertension, patient's blood pressure has been fluctuating throughout the day. Will give Zofran 4 mg IV every 6 hours when necessary Blood pressure is stable at this time. Reviewed echo and carotid Dopplers which did not show any significant abnormality Discussed with patient's daughter and wife in detail, trying to find a telemetry bed at [facility]. Hopefully we'll transfer him tonight

## 2015-10-25 NOTE — ED Notes (Signed)
Notified that pt is on the admission list for [facility], but there is a significant wait involved.  Family notified and hospitalist paged.

## 2015-10-25 NOTE — Progress Notes (Addendum)
[facility] PROGRESS NOTE  [name] DOB: 11/13/1945 DOA: 10/24/2015 PCP: Garret Reddish, MD  Assessment/Plan: 1. TIA. Patient presented with transient dysarthria and confusion. His symptoms resolved prior to arrival to the ED and have not recurred. He does not have any focal deficits at this time. TIA workup has been ordered. Follow up MRI/MRA, Carotid dopplers, and ECHO. LDL 16. Will continue his outpatient dose of Plavix. Since he has a hx of atrial fibrillation, will continue Pradaxa. In light of #6, case was discussed with Dr. Debara Pickett who agreed with continuing anticoagulation at this time 2. Acute on chronic systolic CHF.  Improving with IV Lasix. Follow up ECHO. Continue Coreg, Imdur, ARB, and Spironolactone. ECHO from 07/2015 showed EF of 35-40%. 3. DM type 2, stable. Continue home meds except for Metformin. Start SSI. Follow up HgbA1c results. 4. Essential HTN, stable. Continue home meds.  5. Atrial fibrillation, currently rate controlled. Continue Pradaxa. 6. CAD, according to patient, he has had progressive anginal symptoms and his cardiologist, [name], has planned on a angioplasty on 1/18. For this reason, patient and family have requested a transfer to [facility] so that he may be seen by cardiology. Will continue his medical management.   Code Status: Full DVT prophylaxis: Pradaxa Family Communication: Family at bedside.  Disposition Plan: Discharge home when improved.    Consultants:     Procedures:     Antibiotics:     HPI/Subjective: Feels good. Has not had any recurrent difficulty speaking.   Objective: Filed Vitals:   10/25/15 0630 10/25/15 0700  BP: 141/100 149/76  Pulse: 85 68  Temp:    Resp:      Intake/Output Summary (Last 24 hours) at 10/25/15 0746 Last data filed at 10/25/15 0656  Gross per 24 hour  Intake      0 ml  Output    700 ml  Net   -700 ml   Filed Weights   10/24/15 1918  Weight: 132.45 kg (292  lb)    Exam:  General: NAD, looks comfortable Cardiovascular: Irregularly irregular Respiratory: Diminished breath sounds with crackles at the bases, no wheezing Abdomen: soft, non tender, no distention , bowel sounds normal Musculoskeletal: Trace edema b/l Neurological: 5/5 strength bilateral upper and lower extremities  Data Reviewed: Basic Metabolic Panel:  Recent Labs Lab 10/21/15 1701 10/24/15 1917 10/24/15 1943  NA 139 141 141  K 4.3 4.0 3.9  CL 103 105 102  CO2 27 27  --   GLUCOSE 93 208* 207*  BUN 13 15 14   CREATININE 1.03 0.88 0.80  CALCIUM 9.0 8.7*  --    Liver Function Tests:  Recent Labs Lab 10/24/15 1917  AST 15  ALT 11*  ALKPHOS 75  BILITOT 0.6  PROT 6.5  ALBUMIN 4.0   CBC:  Recent Labs Lab 10/21/15 1701 10/24/15 1917 10/24/15 1943  WBC 10.6* 8.7  --   NEUTROABS  --  7.0  --   HGB 13.8 13.0 13.9  HCT 42.3 40.1 41.0  MCV 81.5 83.9  --   PLT 183 155  --    Cardiac Enzymes:  Recent Labs Lab 10/24/15 1917  TROPONINI <0.03   BNP (last 3 results)  Recent Labs  10/24/15 1917  BNP 157.0*   CBG:  Recent Labs Lab 10/24/15 2215  GLUCAP 182*    Studies: Ct Head Wo Contrast  10/24/2015  CLINICAL DATA:  Acute onset of confusion and slurred speech. Left-sided headache. Initial encounter. EXAM: CT HEAD  WITHOUT CONTRAST TECHNIQUE: Contiguous axial images were obtained from the base of the skull through the vertex without intravenous contrast. COMPARISON:  None. FINDINGS: There is no evidence of acute infarction, mass lesion, or intra- or extra-axial hemorrhage on CT. Prominence of the ventricles and sulci reflects mild to moderate cortical volume loss. Scattered periventricular and subcortical white matter change likely reflects small vessel ischemic microangiopathy. Small chronic lacunar infarcts are noted at the basal ganglia bilaterally. Cerebellar atrophy is noted. The brainstem and fourth ventricle are within normal limits. The  cerebral hemispheres demonstrate grossly normal gray-white differentiation. No mass effect or midline shift is seen. There is no evidence of fracture; visualized osseous structures are unremarkable in appearance. The visualized portions of the orbits are within normal limits. The paranasal sinuses and mastoid air cells are well-aerated. No significant soft tissue abnormalities are seen. IMPRESSION: 1. No acute intracranial pathology seen on CT. 2. Mild to moderate cortical volume loss and scattered small vessel ischemic microangiopathy. 3. Small chronic lacunar infarcts at the basal ganglia bilaterally. Electronically Signed   By: Garald Balding M.D.   On: 10/24/2015 19:35   Dg Chest Portable 1 View  10/25/2015  CLINICAL DATA:  Shortness of breath, history coronary artery disease post MI and coronary stenting, type II diabetes mellitus, hypertension, ischemic cardiomyopathy, atrial fibrillation EXAM: PORTABLE CHEST 1 VIEW COMPARISON:  Portable exam 2340 hours compared to 01/04/2011 FINDINGS: Enlargement of cardiac silhouette with pulmonary vascular congestion. Azygos fissure noted. Mild perihilar infiltrates likely pulmonary edema and CHF. No gross pleural effusion or pneumothorax. Examination limited secondary to body habitus. IMPRESSION: Enlargement of cardiac silhouette with vascular congestion and pulmonary infiltrates most likely representing pulmonary edema/CHF. Electronically Signed   By: Lavonia Dana M.D.   On: 10/25/2015 00:05    Scheduled Meds: . atorvastatin  20 mg Oral Daily  . carvedilol  25 mg Oral BID WC  . clopidogrel  75 mg Oral Daily  . dabigatran  150 mg Oral BID  . glipiZIDE  10 mg Oral BID AC  . hydrALAZINE  25 mg Oral Once  . insulin aspart  0-15 Units Subcutaneous TID WC  . insulin aspart  0-5 Units Subcutaneous QHS  . irbesartan  300 mg Oral Daily  . isosorbide mononitrate  30 mg Oral Daily  . levothyroxine  50 mcg Oral QAC breakfast  . linagliptin  5 mg Oral Daily  .  mesalamine  1.5 g Oral Daily  . metFORMIN  1,000 mg Oral BID WC  . potassium chloride SA  20 mEq Oral Daily   Continuous Infusions:   Active Problems:   Type II diabetes mellitus with peripheral circulatory disorder (HCC)   Essential hypertension   OSA (obstructive sleep apnea)   Atrial fibrillation (HCC)   TIA (transient ischemic attack)    Time spent: 25 minutes   Sherita Decoste. MD  [facility] Pager [phone]. If 7PM-7AM, please contact night-coverage at [email or web address], password [facility] 10/25/2015, 7:46 AM  LOS: 1 day     By signing my name below, I, Rosalie Doctor, attest that this documentation has been prepared under the direction and in the presence of [facility]. MD Electronically Signed: Rosalie Doctor, Scribe. 10/25/2015 10:54am  I, Dr. Kathie Dike, personally performed the services described in this documentaiton. All medical record entries made by the scribe were at my direction and in my presence. I have reviewed the chart and agree that the record reflects my personal performance and is accurate and complete  Kathie Dike, MD, 10/25/2015  12:04 PM

## 2015-10-25 NOTE — Progress Notes (Signed)
Echocardiogram 2D Echocardiogram has been performed.  [name] 10/25/2015, 3:47 PM

## 2015-10-25 NOTE — ED Notes (Signed)
Pt sitting on side of bed.  Denies any complaints.  CPAP removed for the day.  Wife sitting with pt.

## 2015-10-25 NOTE — Progress Notes (Signed)
Patient has Home CPAP. Places himself on and off.

## 2015-10-26 ENCOUNTER — Telehealth: Payer: Self-pay | Admitting: Cardiovascular Disease

## 2015-10-26 ENCOUNTER — Inpatient Hospital Stay (HOSPITAL_COMMUNITY): Payer: Medicare Other

## 2015-10-26 DIAGNOSIS — G451 Carotid artery syndrome (hemispheric): Secondary | ICD-10-CM

## 2015-10-26 DIAGNOSIS — E785 Hyperlipidemia, unspecified: Secondary | ICD-10-CM

## 2015-10-26 DIAGNOSIS — I2510 Atherosclerotic heart disease of native coronary artery without angina pectoris: Secondary | ICD-10-CM

## 2015-10-26 DIAGNOSIS — G4733 Obstructive sleep apnea (adult) (pediatric): Secondary | ICD-10-CM

## 2015-10-26 DIAGNOSIS — I5023 Acute on chronic systolic (congestive) heart failure: Secondary | ICD-10-CM

## 2015-10-26 LAB — GLUCOSE, CAPILLARY
GLUCOSE-CAPILLARY: 128 mg/dL — AB (ref 65–99)
GLUCOSE-CAPILLARY: 132 mg/dL — AB (ref 65–99)
GLUCOSE-CAPILLARY: 95 mg/dL (ref 65–99)
Glucose-Capillary: 149 mg/dL — ABNORMAL HIGH (ref 65–99)

## 2015-10-26 LAB — HEMOGLOBIN A1C
Hgb A1c MFr Bld: 7.6 % — ABNORMAL HIGH (ref 4.8–5.6)
Mean Plasma Glucose: 171 mg/dL

## 2015-10-26 MED ORDER — HEPARIN (PORCINE) IN NACL 100-0.45 UNIT/ML-% IJ SOLN
2000.0000 [IU]/h | INTRAMUSCULAR | Status: DC
  Administered 2015-10-26: 1500 [IU]/h via INTRAVENOUS
  Administered 2015-10-27: 1800 [IU]/h via INTRAVENOUS
  Administered 2015-10-28: 2000 [IU]/h via INTRAVENOUS
  Filled 2015-10-26 (×3): qty 250

## 2015-10-26 MED ORDER — HYDRALAZINE HCL 20 MG/ML IJ SOLN
10.0000 mg | Freq: Four times a day (QID) | INTRAMUSCULAR | Status: DC | PRN
  Administered 2015-10-26: 10 mg via INTRAVENOUS
  Filled 2015-10-26: qty 1

## 2015-10-26 NOTE — Telephone Encounter (Signed)
New message     Calling to let Dr Burt Knack know that patient was transferred to [facility] from [place] last night.  He is on the 5th floor and is scheduled to have a cath this week.  Family want Dr Burt Knack to stop by and see him if possible

## 2015-10-26 NOTE — Progress Notes (Signed)
RT note: Pt. assisted in placing on home CPAP for h/s, wife @ bedside, on room air, RT to monitor.

## 2015-10-26 NOTE — Progress Notes (Signed)
Patient Demographics:    [name], is a 70 y.o. male, DOB - Jun 26, 1946, [name]:[id number]  Admit date - 10/24/2015   Admitting Physician Truett Mainland, DO  Outpatient Primary MD for the patient is Garret Reddish, MD  LOS - 2   Chief Complaint  Patient presents with  . Code Stroke        Subjective:    [name] today has, No headache, No chest pain, No abdominal pain - No Nausea, No new weakness tingling or numbness, No Cough - SOB.    Assessment  & Plan :   1. Resolved dysarthria. Could have been due to TIA. Currently on Protonix, nor history of mural thrombus, also on Plavix and statin for secondary prevention. LDL at goal, 1 mL 7.6, neurology following. No stroke on MRI, generalized cerebral arterial disease noted we'll defer neurology for further management. Echo and carotid ultrasound nonacute. Will be seen by PT-OT and speech as well.  2. Hx of CAD. chest pain free, on Coreg, Plavix, Peridex, statin, Imdur for secondary prevention. Cards consulted as he was due for left heart catheterization on 10/27/2015.  3. History of chronic atrial fibrillation with known intramural thrombus. Continue Coreg and Pradaxa for now.  4. OSA. CPAP at night.  5. Essential hypertension. Currently on Coreg, Lasix, hydralazine, ARB, Imdur which will be continued at home dose.  6. Chronic diastolic and systolic heart failure EF 35%. Currently compensated, continue ARB, Lasix-Aldactone, Coreg combination.   7. Hypothyroidism. Continue home dose Synthroid.    8. DM type II. C mildly elevated, he is currently on sliding scale insulin along with glipizide and Linaglipitin.  Lab Results  Component Value Date   HGBA1C 7.6* 10/25/2015    CBG (last 3)   Recent Labs  10/25/15 1957 10/25/15 2126  10/26/15 0844  GLUCAP 178* 178* 149*      Code Status : Full  Family Communication  : Wife and son bedside  Disposition Plan  : TBD  Consults  : Neuro, Cards  Procedures  :  CT head, MRI/MRA brain. No acute infarct. Some intracerebellar arterial sees noted.  Carotid ultrasound = is than 50% stenosis and bilateral ICA.  TTE  Left ventricle: Wall thickness was increased in a pattern ofsevere LVH, however, the anterior wall and apex are thinned andhyperechoic. Definity contrast opacifies the apex, there is nomural thrombus. Systolic function was moderately reduced. Theestimated ejection fraction was in the range of 35% to 40%.Anteroseptal, anterior, apical and inferoapical akinesissuggestive of prior LAD infarct. The study is not technicallysufficient to allow evaluation of LV diastolic function. - Mitral valve: Mildly thickened leaflets . There was trivialregurgitation. - Left atrium: The atrium was mildly dilated. - Inferior vena cava: The vessel was dilated. The respirophasicdiameter changes were blunted (< 50%), consistent with elevatedcentral venous pressure. - Pericardium, extracardiac: Features were not consistent withtamponade physiology.  Impressions:  Compared to a prior study in 07/2015, there has been no change.No mural thrombus.  DVT Prophylaxis  :  Pradaxa  Lab Results  Component Value Date   PLT 155 10/24/2015    Inpatient Medications  Scheduled Meds: . atorvastatin  20 mg Oral Daily  . carvedilol  25 mg Oral BID WC  . clopidogrel  75 mg Oral  Daily  . dabigatran  150 mg Oral BID  . furosemide  20 mg Intravenous BID  . glipiZIDE  10 mg Oral BID AC  . hydrALAZINE  25 mg Oral Once  . insulin aspart  0-15 Units Subcutaneous TID WC  . insulin aspart  0-5 Units Subcutaneous QHS  . irbesartan  300 mg Oral Daily  . isosorbide mononitrate  30 mg Oral Daily  . levothyroxine  50 mcg Oral QAC breakfast  . linagliptin  5 mg Oral Daily  . mesalamine   1.5 g Oral Daily  . potassium chloride SA  20 mEq Oral Daily  . spironolactone  12.5 mg Oral Daily   Continuous Infusions:  PRN Meds:.hydrALAZINE, HYDROcodone-acetaminophen, ondansetron (ZOFRAN) IV, senna-docusate  Antibiotics  :     Anti-infectives    None        Objective:   Filed Vitals:   10/25/15 2206 10/25/15 2338 10/26/15 0100 10/26/15 0610  BP: 178/102 180/108  148/82  Pulse: 76 78 76 95  Temp: 97.3 F (36.3 C) 98.1 F (36.7 C)  98.3 F (36.8 C)  TempSrc: Oral Oral  Oral  Resp: 20 18 20 18   Height:      Weight:  132.36 kg (291 lb 12.8 oz)    SpO2: 99% 96% 97% 97%    Wt Readings from Last 3 Encounters:  10/25/15 132.36 kg (291 lb 12.8 oz)  10/21/15 133.085 kg (293 lb 6.4 oz)  09/07/15 137.712 kg (303 lb 9.6 oz)     Intake/Output Summary (Last 24 hours) at 10/26/15 1032 Last data filed at 10/25/15 1743  Gross per 24 hour  Intake    240 ml  Output      0 ml  Net    240 ml     Physical Exam  Awake Alert, Oriented X 3, No new F.N deficits, Normal affect [place].AT,PERRAL Supple Neck,No JVD, No cervical lymphadenopathy appriciated.  Symmetrical Chest wall movement, Good air movement bilaterally, CTAB RRR,No Gallops,Rubs or new Murmurs, No Parasternal Heave +ve B.Sounds, Abd Soft, No tenderness, No organomegaly appriciated, No rebound - guarding or rigidity. No Cyanosis, Clubbing or edema, No new Rash or bruise       Data Review:   Micro Results No results found for this or any previous visit (from the past 240 hour(s)).  Radiology Reports   Ct Head Wo Contrast  10/24/2015  CLINICAL DATA:  Acute onset of confusion and slurred speech. Left-sided headache. Initial encounter. EXAM: CT HEAD WITHOUT CONTRAST TECHNIQUE: Contiguous axial images were obtained from the base of the skull through the vertex without intravenous contrast. COMPARISON:  None. FINDINGS: There is no evidence of acute infarction, mass lesion, or intra- or extra-axial hemorrhage on CT.  Prominence of the ventricles and sulci reflects mild to moderate cortical volume loss. Scattered periventricular and subcortical white matter change likely reflects small vessel ischemic microangiopathy. Small chronic lacunar infarcts are noted at the basal ganglia bilaterally. Cerebellar atrophy is noted. The brainstem and fourth ventricle are within normal limits. The cerebral hemispheres demonstrate grossly normal gray-white differentiation. No mass effect or midline shift is seen. There is no evidence of fracture; visualized osseous structures are unremarkable in appearance. The visualized portions of the orbits are within normal limits. The paranasal sinuses and mastoid air cells are well-aerated. No significant soft tissue abnormalities are seen. IMPRESSION: 1. No acute intracranial pathology seen on CT. 2. Mild to moderate cortical volume loss and scattered small vessel ischemic microangiopathy. 3. Small chronic lacunar infarcts at  the basal ganglia bilaterally. Electronically Signed   By: Garald Balding M.D.   On: 10/24/2015 19:35   Mr Brain Wo Contrast  10/26/2015  CLINICAL DATA:  70 year old diabetic hypertensive male with transient dysarthria and confusion. Subsequent encounter. EXAM: MRI HEAD WITHOUT CONTRAST MRA HEAD WITHOUT CONTRAST TECHNIQUE: Multiplanar, multiecho pulse sequences of the brain and surrounding structures were obtained without intravenous contrast. Angiographic images of the head were obtained using MRA technique without contrast. COMPARISON:  10/24/2015 CT.  No comparison MR. FINDINGS: MRI HEAD FINDINGS Exam is motion degraded. No acute infarct or intracranial hemorrhage. Remote infarct superior right lenticular nucleus/ corona radiata. Remote small right cerebellar infarct. Moderate chronic small vessel disease changes most notable periventricular region and right frontal lobe. Global atrophy without hydrocephalus. No intracranial mass lesion noted on this unenhanced exam.  Prominent exophthalmos. Prominent retrobulbar fat. Symmetric extra-ocular muscles. Minimal to mild paranasal sinus mucosal thickening. Cervical spondylotic changes with C3-4 bulge/ protrusion, spinal stenosis and mild cord flattening. Cervical spine MR can be obtained further delineation if clinically desired. Cervical medullary junction, pituitary region and pineal region unremarkable. MRA HEAD FINDINGS Anterior circulation without medium or large size vessel significant stenosis or occlusion. Right anterior cerebral artery smaller than the left. Fetal type contribution to the left posterior cerebral artery. Right vertebral artery proximal to the right posterior inferior cerebellar artery is not visualized. Narrowed irregular small right vertebral artery between the right posterior inferior cerebellar artery and formation of the basilar artery. Ectatic left vertebral artery without significant narrowing. Ectatic basilar artery with mild irregularity minimal narrowing. High-grade focal stenosis right posterior cerebral artery P1 -P2 junction. Mild narrowing proximal left posterior cerebral artery (proximally as well as involving distal branches). No aneurysm noted. IMPRESSION: MRI HEAD Exam is motion degraded. No acute infarct or intracranial hemorrhage. Remote infarct superior right lenticular nucleus/ corona radiata. Remote small right cerebellar infarct. Moderate chronic small vessel disease changes most notable periventricular region and right frontal lobe. Global atrophy. Prominent exophthalmos.  Prominent retrobulbar fat. Minimal to mild paranasal sinus mucosal thickening. Cervical spondylotic changes with C3-4 bulge/ protrusion, spinal stenosis and mild cord flattening. Cervical spine MR can be obtained further delineation if clinically desired. MRA HEAD Right vertebral artery proximal to the right posterior inferior cerebellar artery is not visualized. Narrowed irregular small right vertebral artery between  the right posterior inferior cerebellar artery and formation of the basilar artery. Ectatic left vertebral artery without significant narrowing. Ectatic basilar artery with mild irregularity and minimal narrowing. High-grade focal stenosis right posterior cerebral artery P1 -P2 junction. Mild narrowing proximal left posterior cerebral artery (proximally as well as involving distal branches). Anterior circulation without medium or large size vessel significant stenosis or occlusion. Right anterior cerebral artery smaller than the left. Electronically Signed   By: Genia Del M.D.   On: 10/26/2015 08:32   US Carotid Bilateral  10/25/2015  CLINICAL DATA:  Slurred speech EXAM: BILATERAL CAROTID DUPLEX ULTRASOUND TECHNIQUE: Pearline Cables scale imaging, color Doppler and duplex ultrasound were performed of bilateral carotid and vertebral arteries in the neck. COMPARISON:  None. FINDINGS: Criteria: Quantification of carotid stenosis is based on velocity parameters that correlate the residual internal carotid diameter with NASCET-based stenosis levels, using the diameter of the distal internal carotid lumen as the denominator for stenosis measurement. The following velocity measurements were obtained: RIGHT ICA:  48 cm/sec CCA:  78 cm/sec SYSTOLIC ICA/CCA RATIO:  0.6 DIASTOLIC ICA/CCA RATIO:  0.6 ECA:  61 cm/sec LEFT ICA:  35 cm/sec CCA:  59  cm/sec SYSTOLIC ICA/CCA RATIO:  0.6 DIASTOLIC ICA/CCA RATIO:  1.1 ECA:  74 cm/sec RIGHT CAROTID ARTERY: Mild calcified plaque in the bulb. Low resistance internal carotid Doppler pattern. RIGHT VERTEBRAL ARTERY:  Antegrade.  Low resistance Doppler pattern. LEFT CAROTID ARTERY: Minimal plaque in the bulb. Low resistance internal carotid Doppler pattern. LEFT VERTEBRAL ARTERY:  Antegrade.  Low resistance Doppler pattern. IMPRESSION: Less than 50% stenosis in the right and left internal carotid arteries. Electronically Signed   By: Marybelle Killings M.D.   On: 10/25/2015 14:01   Dg Chest Portable  1 View  10/25/2015  CLINICAL DATA:  Shortness of breath, history coronary artery disease post MI and coronary stenting, type II diabetes mellitus, hypertension, ischemic cardiomyopathy, atrial fibrillation EXAM: PORTABLE CHEST 1 VIEW COMPARISON:  Portable exam 2340 hours compared to 01/04/2011 FINDINGS: Enlargement of cardiac silhouette with pulmonary vascular congestion. Azygos fissure noted. Mild perihilar infiltrates likely pulmonary edema and CHF. No gross pleural effusion or pneumothorax. Examination limited secondary to body habitus. IMPRESSION: Enlargement of cardiac silhouette with vascular congestion and pulmonary infiltrates most likely representing pulmonary edema/CHF. Electronically Signed   By: Lavonia Dana M.D.   On: 10/25/2015 00:05   Mr Jodene Nam Head/brain Wo Cm  10/26/2015  CLINICAL DATA:  70 year old diabetic hypertensive male with transient dysarthria and confusion. Subsequent encounter. EXAM: MRI HEAD WITHOUT CONTRAST MRA HEAD WITHOUT CONTRAST TECHNIQUE: Multiplanar, multiecho pulse sequences of the brain and surrounding structures were obtained without intravenous contrast. Angiographic images of the head were obtained using MRA technique without contrast. COMPARISON:  10/24/2015 CT.  No comparison MR. FINDINGS: MRI HEAD FINDINGS Exam is motion degraded. No acute infarct or intracranial hemorrhage. Remote infarct superior right lenticular nucleus/ corona radiata. Remote small right cerebellar infarct. Moderate chronic small vessel disease changes most notable periventricular region and right frontal lobe. Global atrophy without hydrocephalus. No intracranial mass lesion noted on this unenhanced exam. Prominent exophthalmos. Prominent retrobulbar fat. Symmetric extra-ocular muscles. Minimal to mild paranasal sinus mucosal thickening. Cervical spondylotic changes with C3-4 bulge/ protrusion, spinal stenosis and mild cord flattening. Cervical spine MR can be obtained further delineation if clinically  desired. Cervical medullary junction, pituitary region and pineal region unremarkable. MRA HEAD FINDINGS Anterior circulation without medium or large size vessel significant stenosis or occlusion. Right anterior cerebral artery smaller than the left. Fetal type contribution to the left posterior cerebral artery. Right vertebral artery proximal to the right posterior inferior cerebellar artery is not visualized. Narrowed irregular small right vertebral artery between the right posterior inferior cerebellar artery and formation of the basilar artery. Ectatic left vertebral artery without significant narrowing. Ectatic basilar artery with mild irregularity minimal narrowing. High-grade focal stenosis right posterior cerebral artery P1 -P2 junction. Mild narrowing proximal left posterior cerebral artery (proximally as well as involving distal branches). No aneurysm noted. IMPRESSION: MRI HEAD Exam is motion degraded. No acute infarct or intracranial hemorrhage. Remote infarct superior right lenticular nucleus/ corona radiata. Remote small right cerebellar infarct. Moderate chronic small vessel disease changes most notable periventricular region and right frontal lobe. Global atrophy. Prominent exophthalmos.  Prominent retrobulbar fat. Minimal to mild paranasal sinus mucosal thickening. Cervical spondylotic changes with C3-4 bulge/ protrusion, spinal stenosis and mild cord flattening. Cervical spine MR can be obtained further delineation if clinically desired. MRA HEAD Right vertebral artery proximal to the right posterior inferior cerebellar artery is not visualized. Narrowed irregular small right vertebral artery between the right posterior inferior cerebellar artery and formation of the basilar artery. Ectatic left vertebral artery without  significant narrowing. Ectatic basilar artery with mild irregularity and minimal narrowing. High-grade focal stenosis right posterior cerebral artery P1 -P2 junction. Mild narrowing  proximal left posterior cerebral artery (proximally as well as involving distal branches). Anterior circulation without medium or large size vessel significant stenosis or occlusion. Right anterior cerebral artery smaller than the left. Electronically Signed   By: Genia Del M.D.   On: 10/26/2015 08:32     CBC  Recent Labs Lab 10/21/15 1701 10/24/15 1917 10/24/15 1943  WBC 10.6* 8.7  --   HGB 13.8 13.0 13.9  HCT 42.3 40.1 41.0  PLT 183 155  --   MCV 81.5 83.9  --   MCH 26.6 27.2  --   MCHC 32.6 32.4  --   RDW 15.4 14.1  --   LYMPHSABS  --  0.8  --   MONOABS  --  0.7  --   EOSABS  --  0.1  --   BASOSABS  --  0.0  --     Chemistries   Recent Labs Lab 10/21/15 1701 10/24/15 1917 10/24/15 1943  NA 139 141 141  K 4.3 4.0 3.9  CL 103 105 102  CO2 27 27  --   GLUCOSE 93 208* 207*  BUN 13 15 14   CREATININE 1.03 0.88 0.80  CALCIUM 9.0 8.7*  --   AST  --  15  --   ALT  --  11*  --   ALKPHOS  --  75  --   BILITOT  --  0.6  --    ------------------------------------------------------------------------------------------------------------------  Recent Labs  10/25/15 0518  CHOL 79  HDL 47  LDLCALC 16  TRIG 79  CHOLHDL 1.7    Lab Results  Component Value Date   HGBA1C 8.7* 10/15/2014   ------------------------------------------------------------------------------------------------------------------ No results for input(s): TSH, T4TOTAL, T3FREE, THYROIDAB in the last 72 hours.  Invalid input(s): FREET3 ------------------------------------------------------------------------------------------------------------------ No results for input(s): VITAMINB12, FOLATE, FERRITIN, TIBC, IRON, RETICCTPCT in the last 72 hours.  Coagulation profile  Recent Labs Lab 10/21/15 1701 10/24/15 1917  INR 1.57* 1.96*    No results for input(s): DDIMER in the last 72 hours.  Cardiac Enzymes  Recent Labs Lab 10/24/15 1917  TROPONINI <0.03    ------------------------------------------------------------------------------------------------------------------    Component Value Date/Time   BNP 157.0* 10/24/2015 1917    Time Spent in minutes   35   [name] K M.D on 10/26/2015 at 10:32 AM  Between 7am to 7pm - Pager - [phone]  After 7pm go to [email or web address] - password [facility]  [facility] -  Office  [phone]

## 2015-10-26 NOTE — Consult Note (Signed)
Referring Physician: Dr Candiss Norse    Chief Complaint: TIA (dysarthria and confusion).  HPI:                                                                                                                                         [name] is an 70 y.o. male with a past medical history significant for HTN, HLD, CAD, MI, ischemic cardiomyopathy,  atrial fibrillation on pradaxa, OSA on CPAP, transferred to [facility] for TIA work up. Wife is at the bedside and stated that 2 days ago they were home watching TV when she noted that her husband was having trouble operating the remote control. She said that he was having a great deal of difficulty putting the channel that he wanted to watch and was " obviously confused, saying I can not make it to work properly". Wife also said that his speech was slurred and he was hesitant getting his words out. His grandson was home and he called EMS. By the time they arrive to [facility] the symptoms had subsided, and he estimate that the entire episode lasted about 2 hours off an on. He developed severe pain around the left eye and vomiting in the ED but denies vertigo, double vision, focal weakness or numbness, or visual disturbances. CT head while in the ED showed no acute abnormality and he was subsequently transferred to [facility] where he had MRI/MRA brain that I reviewed myself and showed no acute abnormality. Total cholesterol 79, LDL 16, HDL 47, triglycerides 79 TTE: EF 35-40%, no mural thrombus, left atrium mildly enlarged,   Date last known well: 10/24/15  Time last known well: uncertain tPA Given: no, symptoms resolved   Past Medical History  Diagnosis Date  . CORONARY ARTERY DISEASE 04/12/2007    2 stents last in 2006.   Marland Kitchen DIABETES MELLITUS, TYPE II 04/16/2007  . HYPERLIPIDEMIA 04/16/2007    reveal study. not sure of medication  . HYPERTENSION 04/12/2007  . HYPOTHYROIDISM 04/12/2007  . MYOCARDIAL INFARCTION, HX OF 04/12/2007  . OBESITY 06/16/2009  . SLEEP APNEA 04/12/2007     CPAP  . ULCERATIVE COLITIS, LEFT SIDED 11/23/2010  . Ischemic cardiomyopathy     cath 35-40%  . Atrial fibrillation ([place])     initial diagnoses 2012  . B12 deficiency     per patient previously taking shots  . Diverticulosis of colon (without mention of hemorrhage)     Past Surgical History  Procedure Laterality Date  . Coronary stent placement  2008    LAD   . Cardiac catheterization  10/2002    STENT. 2 stents Dr. Maurene Capes  . Tonsillectomy      Family History  Problem Relation Age of Onset  . Heart attack Mother     mid 30s  . Heart disease Father     H/O CAD, CABG, VALVE SURGERY  . Hypertension Brother   . Obesity  Brother   . Stomach cancer Maternal Aunt   . Stomach cancer Paternal Grandmother     ? colon    Social History:  reports that he quit smoking about 48 years ago. His smoking use included Cigarettes. He has a 5 pack-year smoking history. He has never used smokeless tobacco. He reports that he drinks alcohol. He reports that he does not use illicit drugs. Family history: no MS, brain tumor, or brain aneurysm Allergies: No Known Allergies  Medications:                                                                                                                           Scheduled: . atorvastatin  20 mg Oral Daily  . carvedilol  25 mg Oral BID WC  . clopidogrel  75 mg Oral Daily  . dabigatran  150 mg Oral BID  . furosemide  20 mg Intravenous BID  . glipiZIDE  10 mg Oral BID AC  . hydrALAZINE  25 mg Oral Once  . insulin aspart  0-15 Units Subcutaneous TID WC  . insulin aspart  0-5 Units Subcutaneous QHS  . irbesartan  300 mg Oral Daily  . isosorbide mononitrate  30 mg Oral Daily  . levothyroxine  50 mcg Oral QAC breakfast  . linagliptin  5 mg Oral Daily  . mesalamine  1.5 g Oral Daily  . ondansetron      . potassium chloride SA  20 mEq Oral Daily  . spironolactone  12.5 mg Oral Daily    ROS:                                                                                                                                        History obtained from wife, chart review and the patient  General ROS: negative for - chills, fatigue, fever, night sweats, weight gain or weight loss Psychological ROS: negative for - behavioral disorder, hallucinations, memory difficulties, mood swings or suicidal ideation Ophthalmic ROS: negative for - blurry vision, double vision, eye pain or loss of vision ENT ROS: negative for - epistaxis, nasal discharge, oral lesions, sore throat, tinnitus or vertigo Allergy and Immunology ROS: negative for - hives or itchy/watery eyes Hematological and Lymphatic ROS: negative for - bleeding problems, bruising or swollen lymph nodes Endocrine ROS: negative for - galactorrhea, hair pattern changes, polydipsia/polyuria or temperature intolerance Respiratory ROS: negative  for - cough, hemoptysis, shortness of breath or wheezing Cardiovascular ROS: negative for - chest pain, dyspnea on exertion, edema or irregular heartbeat Gastrointestinal ROS: negative for - abdominal pain, diarrhea, hematemesis, nausea/vomiting or stool incontinence Genito-Urinary ROS: negative for - dysuria, hematuria, incontinence or urinary frequency/urgency Musculoskeletal ROS: negative for - joint swelling or muscular weakness Neurological ROS: as noted in HPI Dermatological ROS: negative for rash and skin lesion changes  Physical exam:  Constitutional:pleasant, obese male in no apparent distress. Blood pressure 148/82, pulse 95, temperature 98.3 F (36.8 C), temperature source Oral, resp. rate 18, height _0  (1.727 m), weight 132.36 kg (291 lb 12.8 oz), SpO2 97 %. Eyes: no jaundice or exophthalmos.  Head: normocephalic. Neck: supple, no bruits, no JVD. Cardiac: no murmurs. Lungs: clear. Abdomen: soft, no tender, no mass. Extremities:mild bilateral pitting edema, no clubbing, or cyanosis.  Skin: no rash  Neurological examination: General: NAD Mental  Status: Alert, oriented, thought content appropriate.  Speech fluent without evidence of aphasia.  Able to follow 3 step commands without difficulty. Cranial Nerves: II: Discs flat bilaterally; Visual fields grossly normal, pupils equal, round, reactive to light and accommodation III,IV, VI: ptosis not present, extra-ocular motions intact bilaterally V,VII: smile symmetric, facial light touch sensation normal bilaterally VIII: hearing normal bilaterally IX,X: uvula rises symmetrically XI: bilateral shoulder shrug XII: midline tongue extension without atrophy or fasciculations  Motor: Right : Upper extremity   5/5    Left:     Upper extremity   5/5  Lower extremity   5/5     Lower extremity   5/5 Tone and bulk:normal tone throughout; no atrophy noted Sensory: Pinprick and light touch intact throughout, bilaterally Deep Tendon Reflexes:  Right: Upper Extremity   Left: Upper extremity   biceps (C-5 to C-6) 2/4   biceps (C-5 to C-6) 2/4 tricep (C7) 2/4    triceps (C7) 2/4 Brachioradialis (C6) 2/4  Brachioradialis (C6) 2/4  Lower Extremity Lower Extremity  quadriceps (L-2 to L-4) 2/4   quadriceps (L-2 to L-4) 2/4 Achilles (S1) 2/4   Achilles (S1) 2/4  Plantars: Right: downgoing   Left: downgoing Cerebellar: normal finger-to-nose,  normal heel-to-shin test Gait:  No ataxia  Results for orders placed or performed during the hospital encounter of 10/24/15 (from the past 48 hour(s))  Ethanol     Status: None   Collection Time: 10/24/15  7:17 PM  Result Value Ref Range   Alcohol, Ethyl (B) <5 <5 mg/dL    Comment:        LOWEST DETECTABLE LIMIT FOR SERUM ALCOHOL IS 5 mg/dL FOR MEDICAL PURPOSES ONLY   Protime-INR     Status: Abnormal   Collection Time: 10/24/15  7:17 PM  Result Value Ref Range   Prothrombin Time 22.2 (H) 11.6 - 15.2 seconds   INR 1.96 (H) 0.00 - 1.49  APTT     Status: Abnormal   Collection Time: 10/24/15  7:17 PM  Result Value Ref Range   aPTT 55 (H) 24 - 37  seconds    Comment:        IF BASELINE aPTT IS ELEVATED, SUGGEST PATIENT RISK ASSESSMENT BE USED TO DETERMINE APPROPRIATE ANTICOAGULANT THERAPY.   CBC     Status: None   Collection Time: 10/24/15  7:17 PM  Result Value Ref Range   WBC 8.7 4.0 - 10.5 K/uL   RBC 4.78 4.22 - 5.81 MIL/uL   Hemoglobin 13.0 13.0 - 17.0 g/dL   HCT 40.1 39.0 - 52.0 %  MCV 83.9 78.0 - 100.0 fL   MCH 27.2 26.0 - 34.0 pg   MCHC 32.4 30.0 - 36.0 g/dL   RDW 14.1 11.5 - 15.5 %   Platelets 155 150 - 400 K/uL  Differential     Status: None   Collection Time: 10/24/15  7:17 PM  Result Value Ref Range   Neutrophils Relative % 80 %   Neutro Abs 7.0 1.7 - 7.7 K/uL   Lymphocytes Relative 10 %   Lymphs Abs 0.8 0.7 - 4.0 K/uL   Monocytes Relative 8 %   Monocytes Absolute 0.7 0.1 - 1.0 K/uL   Eosinophils Relative 2 %   Eosinophils Absolute 0.1 0.0 - 0.7 K/uL   Basophils Relative 0 %   Basophils Absolute 0.0 0.0 - 0.1 K/uL  Comprehensive metabolic panel     Status: Abnormal   Collection Time: 10/24/15  7:17 PM  Result Value Ref Range   Sodium 141 135 - 145 mmol/L   Potassium 4.0 3.5 - 5.1 mmol/L   Chloride 105 101 - 111 mmol/L   CO2 27 22 - 32 mmol/L   Glucose, Bld 208 (H) 65 - 99 mg/dL   BUN 15 6 - 20 mg/dL   Creatinine, Ser 0.88 0.61 - 1.24 mg/dL   Calcium 8.7 (L) 8.9 - 10.3 mg/dL   Total Protein 6.5 6.5 - 8.1 g/dL   Albumin 4.0 3.5 - 5.0 g/dL   AST 15 15 - 41 U/L   ALT 11 (L) 17 - 63 U/L   Alkaline Phosphatase 75 38 - 126 U/L   Total Bilirubin 0.6 0.3 - 1.2 mg/dL   GFR calc non Af Amer >60 >60 mL/min   GFR calc Af Amer >60 >60 mL/min    Comment: (NOTE) The eGFR has been calculated using the CKD EPI equation. This calculation has not been validated in all clinical situations. eGFR's persistently <60 mL/min signify possible Chronic Kidney Disease.    Anion gap 9 5 - 15  Troponin I     Status: None   Collection Time: 10/24/15  7:17 PM  Result Value Ref Range   Troponin I <0.03 <0.031 ng/mL     Comment:        NO INDICATION OF MYOCARDIAL INJURY.   Brain natriuretic peptide     Status: Abnormal   Collection Time: 10/24/15  7:17 PM  Result Value Ref Range   B Natriuretic Peptide 157.0 (H) 0.0 - 100.0 pg/mL  I-Stat Chem 8, ED  (not at [facility], [facility])     Status: Abnormal   Collection Time: 10/24/15  7:43 PM  Result Value Ref Range   Sodium 141 135 - 145 mmol/L   Potassium 3.9 3.5 - 5.1 mmol/L   Chloride 102 101 - 111 mmol/L   BUN 14 6 - 20 mg/dL   Creatinine, Ser 0.80 0.61 - 1.24 mg/dL   Glucose, Bld 207 (H) 65 - 99 mg/dL   Calcium, Ion 1.11 (L) 1.13 - 1.30 mmol/L   TCO2 27 0 - 100 mmol/L   Hemoglobin 13.9 13.0 - 17.0 g/dL   HCT 41.0 39.0 - 52.0 %  CBG monitoring, ED     Status: Abnormal   Collection Time: 10/24/15 10:15 PM  Result Value Ref Range   Glucose-Capillary 182 (H) 65 - 99 mg/dL  Lipid panel     Status: None   Collection Time: 10/25/15  5:18 AM  Result Value Ref Range   Cholesterol 79 0 - 200 mg/dL   Triglycerides 79 <  150 mg/dL   HDL 47 >40 mg/dL   Total CHOL/HDL Ratio 1.7 RATIO   VLDL 16 0 - 40 mg/dL   LDL Cholesterol 16 0 - 99 mg/dL    Comment:        Total Cholesterol/HDL:CHD Risk Coronary Heart Disease Risk Table                     Men   Women  1/2 Average Risk   3.4   3.3  Average Risk       5.0   4.4  2 X Average Risk   9.6   7.1  3 X Average Risk  23.4   11.0        Use the calculated Patient Ratio above and the CHD Risk Table to determine the patient's CHD Risk.        ATP III CLASSIFICATION (LDL):  <100     mg/dL   Optimal  100-129  mg/dL   Near or Above                    Optimal  130-159  mg/dL   Borderline  160-189  mg/dL   High  >190     mg/dL   Very High   CBG monitoring, ED     Status: Abnormal   Collection Time: 10/25/15  8:10 AM  Result Value Ref Range   Glucose-Capillary 137 (H) 65 - 99 mg/dL  Glucose, capillary     Status: Abnormal   Collection Time: 10/25/15  7:57 PM  Result Value Ref Range   Glucose-Capillary 178 (H) 65 -  99 mg/dL   Comment 1 Notify RN   Glucose, capillary     Status: Abnormal   Collection Time: 10/25/15  9:26 PM  Result Value Ref Range   Glucose-Capillary 178 (H) 65 - 99 mg/dL   Comment 1 Notify RN    Comment 2 Document in Chart    *Note: Due to a large number of results and/or encounters for the requested time period, some results have not been displayed. A complete set of results can be found in Results Review.   Ct Head Wo Contrast  10/24/2015  CLINICAL DATA:  Acute onset of confusion and slurred speech. Left-sided headache. Initial encounter. EXAM: CT HEAD WITHOUT CONTRAST TECHNIQUE: Contiguous axial images were obtained from the base of the skull through the vertex without intravenous contrast. COMPARISON:  None. FINDINGS: There is no evidence of acute infarction, mass lesion, or intra- or extra-axial hemorrhage on CT. Prominence of the ventricles and sulci reflects mild to moderate cortical volume loss. Scattered periventricular and subcortical white matter change likely reflects small vessel ischemic microangiopathy. Small chronic lacunar infarcts are noted at the basal ganglia bilaterally. Cerebellar atrophy is noted. The brainstem and fourth ventricle are within normal limits. The cerebral hemispheres demonstrate grossly normal gray-white differentiation. No mass effect or midline shift is seen. There is no evidence of fracture; visualized osseous structures are unremarkable in appearance. The visualized portions of the orbits are within normal limits. The paranasal sinuses and mastoid air cells are well-aerated. No significant soft tissue abnormalities are seen. IMPRESSION: 1. No acute intracranial pathology seen on CT. 2. Mild to moderate cortical volume loss and scattered small vessel ischemic microangiopathy. 3. Small chronic lacunar infarcts at the basal ganglia bilaterally. Electronically Signed   By: Garald Balding M.D.   On: 10/24/2015 19:35   US Carotid Bilateral  10/25/2015   CLINICAL DATA:  Slurred  speech EXAM: BILATERAL CAROTID DUPLEX ULTRASOUND TECHNIQUE: Pearline Cables scale imaging, color Doppler and duplex ultrasound were performed of bilateral carotid and vertebral arteries in the neck. COMPARISON:  None. FINDINGS: Criteria: Quantification of carotid stenosis is based on velocity parameters that correlate the residual internal carotid diameter with NASCET-based stenosis levels, using the diameter of the distal internal carotid lumen as the denominator for stenosis measurement. The following velocity measurements were obtained: RIGHT ICA:  48 cm/sec CCA:  78 cm/sec SYSTOLIC ICA/CCA RATIO:  0.6 DIASTOLIC ICA/CCA RATIO:  0.6 ECA:  61 cm/sec LEFT ICA:  35 cm/sec CCA:  59 cm/sec SYSTOLIC ICA/CCA RATIO:  0.6 DIASTOLIC ICA/CCA RATIO:  1.1 ECA:  74 cm/sec RIGHT CAROTID ARTERY: Mild calcified plaque in the bulb. Low resistance internal carotid Doppler pattern. RIGHT VERTEBRAL ARTERY:  Antegrade.  Low resistance Doppler pattern. LEFT CAROTID ARTERY: Minimal plaque in the bulb. Low resistance internal carotid Doppler pattern. LEFT VERTEBRAL ARTERY:  Antegrade.  Low resistance Doppler pattern. IMPRESSION: Less than 50% stenosis in the right and left internal carotid arteries. Electronically Signed   By: Marybelle Killings M.D.   On: 10/25/2015 14:01   Dg Chest Portable 1 View  10/25/2015  CLINICAL DATA:  Shortness of breath, history coronary artery disease post MI and coronary stenting, type II diabetes mellitus, hypertension, ischemic cardiomyopathy, atrial fibrillation EXAM: PORTABLE CHEST 1 VIEW COMPARISON:  Portable exam 2340 hours compared to 01/04/2011 FINDINGS: Enlargement of cardiac silhouette with pulmonary vascular congestion. Azygos fissure noted. Mild perihilar infiltrates likely pulmonary edema and CHF. No gross pleural effusion or pneumothorax. Examination limited secondary to body habitus. IMPRESSION: Enlargement of cardiac silhouette with vascular congestion and pulmonary infiltrates most  likely representing pulmonary edema/CHF. Electronically Signed   By: Lavonia Dana M.D.   On: 10/25/2015 00:05    Assessment: 70 y.o. male with multiple risk factors for stroke and likely TIA. MRI/MRA brain unrevealing, TTE: EF 35-40%, no mural thrombus, left atrium mildly enlarged. Total cholesterol 79, LDL 16, HDL 47, triglycerides 79. CUS <50% stenosis right and left ICA. Hemoglobin A1c pending. Patient on pradaxa. Stroke team will follow up tomorrow.   Stroke Risk Factors - age, HTN, HLD, CAD, MI, ischemic cardiomyopathy,  atrial fibrillation, obesity.   Plan: 1. HgbA1c, fasting lipid panel 2. MRI, MRA  of the brain without contrast 3. Echocardiogram 4. Carotid dopplers 5. Prophylactic therapy-pradaxa 6. Risk factor modification 7. Telemetry monitoring 8. Frequent neuro checks 9. PT/OT SLP  Dorian Pod, MD [facility] [phone]  10/26/2015, 8:16 AM

## 2015-10-26 NOTE — Progress Notes (Signed)
SATURATION QUALIFICATIONS: (This note is used to comply with regulatory documentation for home oxygen)  Patient Saturations on Room Air at Rest = 97%  Patient Saturations on Room Air while Ambulating = 84%  Patient Saturations on 1 Liters of oxygen while Ambulating = 88% Patient Saturations on 2 Liters of oxygen while Ambulating = 97%  Please briefly explain why patient needs home oxygen: To maintain O2 saturation during activity   10/26/2015 Barry Brunner, PT Pager: [phone]

## 2015-10-26 NOTE — Progress Notes (Signed)
Occupational Therapy Evaluation/Discharge Patient Details Name: [name] MRN: [id number] DOB: [birth year] Today's Date: 10/26/2015    History of Present Illness 70 y.o. male admitted for confusion and slurred speech. 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 PMH significant for CAD, atrial fibrillation, DM type II, HTN, hyperlipidemia, hypothyroidism, MI (2008), obesity, sleep apnea, ulcerative colitis, B12 deficiency, and diverticulosis of colon.    Clinical Impression   Pt has been evaluated by Occupational Therapy with no acute needs identified. Educated pt and wife on signs/symptoms of stroke, energy conservation and fall prevention strategies. All education has been completed and pt has no further questions. OT signing off. Thank you for this referral.    Follow Up Recommendations  No OT follow up    Equipment Recommendations  None recommended by OT    Recommendations for Other Services       Precautions / Restrictions Precautions Precautions: None Restrictions Weight Bearing Restrictions: No      Mobility Bed Mobility Overal bed mobility: Modified Independent             General bed mobility comments: HOB slightly elevated, used bedrails. No physical assist required.  Transfers Overall transfer level: Modified independent Equipment used: None             General transfer comment: All transfers mod I with no overt LOB    Balance Overall balance assessment: Needs assistance Sitting-balance support: No upper extremity supported;Feet supported Sitting balance-Leahy Scale: Good Sitting balance - Comments: Due to body habitus - often needs UE support to maintain upright position   Standing balance support: No upper extremity supported;During functional activity Standing balance-Leahy Scale: Good                              ADL Overall ADL's : Modified independent                                       General ADL Comments: Pt completed all functional transfers and LB ADLs with mod I. Reivewed energy conservation and fall prevention strategies with pt and wife.     Vision Vision Assessment?: Yes Eye Alignment: Within Functional Limits Ocular Range of Motion: Within Functional Limits Alignment/Gaze Preference: Within Defined Limits Tracking/Visual Pursuits: Able to track stimulus in all quads without difficulty Saccades: Within functional limits Convergence: Within functional limits Visual Fields: No apparent deficits   Perception     Praxis      Pertinent Vitals/Pain Pain Assessment: No/denies pain     Hand Dominance Right   Extremity/Trunk Assessment Upper Extremity Assessment Upper Extremity Assessment: Overall WFL for tasks assessed   Lower Extremity Assessment Lower Extremity Assessment: Overall WFL for tasks assessed       Communication Communication Communication: No difficulties   Cognition Arousal/Alertness: Awake/alert Behavior During Therapy: WFL for tasks assessed/performed Overall Cognitive Status: Within Functional Limits for tasks assessed                     General Comments       Exercises       Shoulder Instructions      Home Living Family/patient expects to be discharged to:: Private residence Living Arrangements: Spouse/significant other Available Help at Discharge: Family;Available 24 hours/day Type of Home: House Home Access: Stairs to enter Entrance  Stairs-Number of Steps: 2 Entrance Stairs-Rails: None Home Layout: One level     Bathroom Shower/Tub: Walk-in Hydrologist: Standard     Home Equipment: Bedside commode;Adaptive equipment Adaptive Equipment: Sock aid        Prior Functioning/Environment Level of Independence: Independent        Comments: Works part time Radiation protection practitioner for [employer] Recently has become SOB with physical  exertion.     OT Diagnosis:     OT Problem List: Decreased activity tolerance;Decreased safety awareness;Decreased knowledge of use of DME or AE;Obesity   OT Treatment/Interventions:      OT Goals(Current goals can be found in the care plan section) Acute Rehab OT Goals Patient Stated Goal: to see cardiologist and fix problem causing SOB OT Goal Formulation: With patient Time For Goal Achievement: 11/09/15 Potential to Achieve Goals: Good  OT Frequency:     Barriers to D/C:            Co-evaluation              End of Session Nurse Communication: Mobility status  Activity Tolerance: Patient tolerated treatment well Patient left: in chair;with call bell/phone within reach;with family/visitor present   Time: 0852-0929 OT Time Calculation (min): 37 min Charges:  OT General Charges $OT Visit: 1 Procedure OT Evaluation $OT Eval Moderate Complexity: 1 Procedure OT Treatments $Self Care/Home Management : 8-22 mins G-Codes:    Redmond Baseman, OTR/L Pager: [phone] 10/26/2015, 9:47 AM

## 2015-10-26 NOTE — Telephone Encounter (Signed)
Will route to [name] and Ander Purpura his nurse, so they are aware.

## 2015-10-26 NOTE — Progress Notes (Signed)
Called to check patient  Home CPAP unit and unit checked out good. No fraying of wires or dirt could be seen on or in machine. There was some water in the water chamber that had spilled into the machine reservoir, but no damage noted. Mask had a slight tear at the bridge of the nose on the plastic. Patient on machine now and tolerating well. No issue at this time. Family at bedside during setup and cleaning out of water.

## 2015-10-26 NOTE — Progress Notes (Signed)
ANTICOAGULATION CONSULT NOTE - Initial Consult  Pharmacy Consult for Pradaxa to Heparin Indication: atrial fibrillation  No Known Allergies  Patient Measurements: Height: 5' 8"  (172.7 cm) Weight: 291 lb 12.8 oz (132.36 kg) IBW/kg (Calculated) : 68.4  Heparin dosing weight = 101 kg  Vital Signs: Temp: 98 F (36.7 C) (01/16 1134) Temp Source: Oral (01/16 1134) BP: 117/66 mmHg (01/16 1134) Pulse Rate: 84 (01/16 1134)  Labs:  Recent Labs  10/24/15 1917 10/24/15 1943  HGB 13.0 13.9  HCT 40.1 41.0  PLT 155  --   APTT 55*  --   LABPROT 22.2*  --   INR 1.96*  --   CREATININE 0.88 0.80  TROPONINI <0.03  --     Estimated Creatinine Clearance: 115.9 mL/min (by C-G formula based on Cr of 0.8).   Medical History: Past Medical History  Diagnosis Date  . CORONARY ARTERY DISEASE 04/12/2007    2 stents last in 2006.   Marland Kitchen DIABETES MELLITUS, TYPE II 04/16/2007  . HYPERLIPIDEMIA 04/16/2007    reveal study. not sure of medication  . HYPERTENSION 04/12/2007  . HYPOTHYROIDISM 04/12/2007  . MYOCARDIAL INFARCTION, HX OF 04/12/2007  . OBESITY 06/16/2009  . SLEEP APNEA 04/12/2007    CPAP  . ULCERATIVE COLITIS, LEFT SIDED 11/23/2010  . Ischemic cardiomyopathy     cath 35-40%  . Atrial fibrillation ([place])     initial diagnoses 2012  . B12 deficiency     per patient previously taking shots  . Diverticulosis of colon (without mention of hemorrhage)     Assessment: 70 year old male to transition from Pradaxa to heparin in preparation for cath On Pradaxa PTA for Afib, last dose at 10 am  Goal of Therapy:  Heparin level 0.3-0.7 units/ml Monitor platelets by anticoagulation protocol: Yes   Plan:  Heparin drip to start at 10 pm at 1500 units / hr Daily heparin level, CBC  Thank you Anette Guarneri, PharmD [phone]  10/26/2015,12:50 PM

## 2015-10-26 NOTE — Consult Note (Addendum)
CARDIOLOGY CONSULT NOTE   Patient ID: [name] MRN: [id number] DOB/AGE: [birth year] 70 y.o.  Admit date: 10/24/2015  Primary Physician   Garret Reddish, MD Primary Cardiologist   [name]  Reason for Consultation   TIA and outpatient cath planned for 10/28/15  HPI: [name] is a 70 y.o. male with a history of CAD s/p DES to LAD (2004); ant STEMI s/p PTCA for instent thrombosis (2008), ischemic CM (EF 35-40%), chronic afib on pradaxa, HTN, HLD, DMT2 and obesity who was transferred from [facility] to [facility] on 10/24/15 with dysarthria and confusion. He was seen by neurology and felt to have TIA. Cardiology consulted as he was set up for outpatient cardiac cath on 10/27/15. Past cardiac history: In 2009, he had acute anterior wall STEMI due to stent thrombosis within the stent and the proximal LAD with total occlusion of the LAD and underwent successful PTCA of the stentthrombosis within the Cypher stent and the proximal LAD. He also was noted to have 95%stenosis of a small marginal branch of circumflex artery, no major obstruction of the right coronary and anterolateral wall akinesis with an estimated EF of 35-40%.Dr. Olevia Perches felt that [name] of the stent contributed to the stent thrombosis. It was planned to keep the patient on long-term Plavix.  He was last seen in the office by [name] on 10/21/15. He was felt to have worsening exertional dyspnea and angina and was set up for [facility] on 10/28/15.   On 10/24/15 he was at home watching TV when his wife noted that [name] was having trouble operating the remote control. His wife said that he was having a great deal of difficulty putting the channel that he wanted to watch and was " obviously confused." Wife also said that his speech was slurred and he was hesitant getting his words out. His grandson was home and he called EMS. By the time they arrive to [facility] the symptoms had subsided, and he estimated that the entire episode  lasted about 2 hours off an on. In the ED, he developed severe pain around the left eye and vomiting. CT head showed no acute abnormality and he was subsequently transferred to [facility] where he had MRI/MRA brain that also showed no acute abnormality. Repeat 2D ECHO showed no mural thrombus with EF 35-40% and mildly dilated LA. Neurology following. Of note, he has been given his Pradaxa: he received two doses yesterday ( one at 12;20 last night) and one dose this AM.   He is seen today at [facility]. He has had complete resolution of dysarthria and confusion. He denies CP or SOB, but he has not been exerting himself while admitted. No LE edema or PND. He has chronic stable orthopnea.    Past Medical History  Diagnosis Date  . CORONARY ARTERY DISEASE 04/12/2007    2 stents last in 2006.   Marland Kitchen DIABETES MELLITUS, TYPE II 04/16/2007  . HYPERLIPIDEMIA 04/16/2007    reveal study. not sure of medication  . HYPERTENSION 04/12/2007  . HYPOTHYROIDISM 04/12/2007  . MYOCARDIAL INFARCTION, HX OF 04/12/2007  . OBESITY 06/16/2009  . SLEEP APNEA 04/12/2007    CPAP  . ULCERATIVE COLITIS, LEFT SIDED 11/23/2010  . Ischemic cardiomyopathy     cath 35-40%  . Atrial fibrillation ([place])     initial diagnoses 2012  . B12 deficiency     per patient previously taking shots  . Diverticulosis of colon (without mention of hemorrhage)  Past Surgical History  Procedure Laterality Date  . Coronary stent placement  2008    LAD   . Cardiac catheterization  10/2002    STENT. 2 stents Dr. Maurene Capes  . Tonsillectomy      No Known Allergies  I have reviewed the patient's current medications . atorvastatin  20 mg Oral Daily  . carvedilol  25 mg Oral BID WC  . clopidogrel  75 mg Oral Daily  . dabigatran  150 mg Oral BID  . furosemide  20 mg Intravenous BID  . glipiZIDE  10 mg Oral BID AC  . hydrALAZINE  25 mg Oral Once  . insulin aspart  0-15 Units Subcutaneous TID WC  . insulin aspart  0-5 Units Subcutaneous QHS  . irbesartan  300 mg  Oral Daily  . isosorbide mononitrate  30 mg Oral Daily  . levothyroxine  50 mcg Oral QAC breakfast  . linagliptin  5 mg Oral Daily  . mesalamine  1.5 g Oral Daily  . potassium chloride SA  20 mEq Oral Daily  . spironolactone  12.5 mg Oral Daily     hydrALAZINE, HYDROcodone-acetaminophen, ondansetron (ZOFRAN) IV, senna-docusate  Prior to Admission medications   Medication Sig Start Date End Date Taking? Authorizing Provider  aspirin EC 81 MG tablet Take 81 mg by mouth once.   Yes Historical Provider, MD  atorvastatin (LIPITOR) 20 MG tablet Take 1 tablet (20 mg total) by mouth daily. 08/04/15  Yes Sherren Mocha, MD  carvedilol (COREG) 25 MG tablet TAKE ONE TABLET BY MOUTH TWICE A DAY WITH A MEAL 09/01/15  Yes Sherren Mocha, MD  clopidogrel (PLAVIX) 75 MG tablet Take 75 mg by mouth daily.   Yes Historical Provider, MD  fish oil-omega-3 fatty acids 1000 MG capsule Take 2 g by mouth daily.    Yes Historical Provider, MD  glipiZIDE (GLUCOTROL) 10 MG tablet Take 10 mg by mouth 2 (two) times daily before a meal.   Yes Historical Provider, MD  Glucosamine-Chondroitin (OSTEO BI-FLEX REGULAR STRENGTH) 250-200 MG TABS Take 1 tablet by mouth 2 (two) times daily.    Yes Historical Provider, MD  isosorbide mononitrate (IMDUR) 30 MG 24 hr tablet Take 1 tablet (30 mg total) by mouth daily. 10/21/15  Yes Sherren Mocha, MD  levothyroxine (SYNTHROID, LEVOTHROID) 50 MCG tablet Take 50 mcg by mouth daily before breakfast.   Yes Historical Provider, MD  mesalamine (APRISO) 0.375 g 24 hr capsule Take 1.5 mg by mouth daily.   Yes Historical Provider, MD  metFORMIN (GLUCOPHAGE) 1000 MG tablet Take 1,000 mg by mouth 2 (two) times daily with a meal.   Yes Historical Provider, MD  olmesartan (BENICAR) 40 MG tablet Take 40 mg by mouth daily.   Yes Historical Provider, MD  potassium chloride SA (K-DUR,KLOR-CON) 20 MEQ tablet Take 20 mEq by mouth daily.   Yes Historical Provider, MD  PRADAXA 150 MG CAPS capsule TAKE ONE  TABLET BY MOUTH TWICE A DAY 08/26/15  Yes Sherren Mocha, MD  sitaGLIPtin (JANUVIA) 100 MG tablet Take 100 mg by mouth daily.   Yes Historical Provider, MD  triamcinolone cream (KENALOG) 0.1 % Apply 1 application topically 2 (two) times daily. 06/29/15  Yes Marin Olp, MD  diclofenac sodium (VOLTAREN) 1 % GEL Apply 2 g topically 4 (four) times daily as needed (knee pain).    Historical Provider, MD  potassium chloride SA (K-DUR,KLOR-CON) 20 MEQ tablet Take 1 tablet (20 mEq total) by mouth daily. 10/26/15   Sherren Mocha, MD  spironolactone (ALDACTONE) 25 MG tablet Take 0.5 tablets (12.5 mg total) by mouth daily. 08/20/15   Sherren Mocha, MD     Social History   Social History  . Marital Status: Married    Spouse Name: N/A  . Number of Children: 1  . Years of Education: N/A   Occupational History  . RETIRED    Social History Main Topics  . Smoking status: Former Smoker -- 1.00 packs/day for 5 years    Types: Cigarettes    Quit date: 04/14/1967  . Smokeless tobacco: Never Used  . Alcohol Use: Yes     Comment: social, beer once a month or two - light  . Drug Use: No  . Sexual Activity: Not on file   Other Topics Concern  . Not on file   Social History Narrative   Cardiorehab 3 days a week. 45 minutes to an hour-stationary bike.    GRANDDAUGHTER (MS. 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       Married for 37 years in 2015, married previously for 14 years. Daughter with first wife and 3 grandkids.    Lives alone with wife. Get to see grandkids a lot. New grandchild in middle of 46      Hobbies-yardwork, previously liked to hunt and fish, does some target shooting    Family Status  Relation Status Death Age  . Mother Deceased   . Father Deceased    Family History  Problem Relation Age of Onset  . Heart attack Mother     mid 23s  . Heart disease Father     H/O  CAD, CABG, VALVE SURGERY  . Hypertension Brother   . Obesity Brother   . Stomach cancer Maternal Aunt   . Stomach cancer Paternal Grandmother     ? colon      ROS:  Full 14 point review of systems complete and found to be negative unless listed above.  Physical Exam: Blood pressure 148/82, pulse 95, temperature 98.3 F (36.8 C), temperature source Oral, resp. rate 18, height 5' 8"  (1.727 m), weight 291 lb 12.8 oz (132.36 kg), SpO2 97 %.  General: Well developed, well nourished, male in no acute distress Head: Eyes PERRLA, No xanthomas.   Normocephalic and atraumatic, oropharynx without edema or exudate.  Lungs: ctab Heart: HRRR S1 S2, no rub/gallop, Heart irregular rate and rhythm with S1, S2  murmur. pulses are 2+ extrem.   Neck: No carotid bruits. No lymphadenopathy.  No JVD. Abdomen: Bowel sounds present, abdomen soft and non-tender without masses or hernias noted. Msk:  No spine or cva tenderness. No weakness, no joint deformities or effusions. Extremities: No clubbing or cyanosis. 1+ bilateral edema.  Neuro: Alert and oriented X 3. No focal deficits noted. Psych:  Good affect, responds appropriately Skin: No rashes or lesions noted.  Labs:   Lab Results  Component Value Date   WBC 8.7 10/24/2015   HGB 13.9 10/24/2015   HCT 41.0 10/24/2015   MCV 83.9 10/24/2015   PLT 155 10/24/2015    Recent Labs  10/24/15 1917  INR 1.96*    Recent Labs Lab 10/24/15 1917 10/24/15 1943  NA 141 141  K 4.0 3.9  CL 105 102  CO2 27  --   BUN 15 14  CREATININE 0.88 0.80  CALCIUM 8.7*  --   PROT 6.5  --  BILITOT 0.6  --   ALKPHOS 75  --   ALT 11*  --   AST 15  --   GLUCOSE 208* 207*  ALBUMIN 4.0  --    No results found for: MG  Recent Labs  10/24/15 1917  TROPONINI <0.03    Lab Results  Component Value Date   CHOL 79 10/25/2015   HDL 47 10/25/2015   LDLCALC 16 10/25/2015   TRIG 79 10/25/2015    2D ECHO: 10/25/2015 LV EF: 35% -  40% Study Conclusions -  Procedure narrative: Transthoracic echocardiography. Image quality was poor. The study was technically difficult, as a result of body habitus. Intravenous contrast (Definity) was administered. - Left ventricle: Wall thickness was increased in a pattern of severe LVH, however, the anterior wall and apex are thinned and hyperechoic. Definity contrast opacifies the apex, there is no mural thrombus. Systolic function was moderately reduced. The estimated ejection fraction was in the range of 35% to 40%. Anteroseptal, anterior, apical and inferoapical akinesis suggestive of prior LAD infarct. The study is not technically sufficient to allow evaluation of LV diastolic function. - Mitral valve: Mildly thickened leaflets . There was trivial regurgitation. - Left atrium: The atrium was mildly dilated. - Inferior vena cava: The vessel was dilated. The respirophasic diameter changes were blunted (< 50%), consistent with elevated central venous pressure. - Pericardium, extracardiac: Features were not consistent with tamponade physiology. Impressions: - Compared to a prior study in 07/2015, there has been no change. No mural thrombus.   ECG:  Atrial fib with HR 81  Radiology:  Ct Head Wo Contrast  10/24/2015  CLINICAL DATA:  Acute onset of confusion and slurred speech. Left-sided headache. Initial encounter. EXAM: CT HEAD WITHOUT CONTRAST TECHNIQUE: Contiguous axial images were obtained from the base of the skull through the vertex without intravenous contrast. COMPARISON:  None. FINDINGS: There is no evidence of acute infarction, mass lesion, or intra- or extra-axial hemorrhage on CT. Prominence of the ventricles and sulci reflects mild to moderate cortical volume loss. Scattered periventricular and subcortical white matter change likely reflects small vessel ischemic microangiopathy. Small chronic lacunar infarcts are noted at the basal ganglia bilaterally. Cerebellar  atrophy is noted. The brainstem and fourth ventricle are within normal limits. The cerebral hemispheres demonstrate grossly normal gray-white differentiation. No mass effect or midline shift is seen. There is no evidence of fracture; visualized osseous structures are unremarkable in appearance. The visualized portions of the orbits are within normal limits. The paranasal sinuses and mastoid air cells are well-aerated. No significant soft tissue abnormalities are seen. IMPRESSION: 1. No acute intracranial pathology seen on CT. 2. Mild to moderate cortical volume loss and scattered small vessel ischemic microangiopathy. 3. Small chronic lacunar infarcts at the basal ganglia bilaterally. Electronically Signed   By: Garald Balding M.D.   On: 10/24/2015 19:35   Mr Brain Wo Contrast  10/26/2015  CLINICAL DATA:  70 year old diabetic hypertensive male with transient dysarthria and confusion. Subsequent encounter. EXAM: MRI HEAD WITHOUT CONTRAST MRA HEAD WITHOUT CONTRAST TECHNIQUE: Multiplanar, multiecho pulse sequences of the brain and surrounding structures were obtained without intravenous contrast. Angiographic images of the head were obtained using MRA technique without contrast. COMPARISON:  10/24/2015 CT.  No comparison MR. FINDINGS: MRI HEAD FINDINGS Exam is motion degraded. No acute infarct or intracranial hemorrhage. Remote infarct superior right lenticular nucleus/ corona radiata. Remote small right cerebellar infarct. Moderate chronic small vessel disease changes most notable periventricular region and right frontal lobe. Global atrophy without  hydrocephalus. No intracranial mass lesion noted on this unenhanced exam. Prominent exophthalmos. Prominent retrobulbar fat. Symmetric extra-ocular muscles. Minimal to mild paranasal sinus mucosal thickening. Cervical spondylotic changes with C3-4 bulge/ protrusion, spinal stenosis and mild cord flattening. Cervical spine MR can be obtained further delineation if  clinically desired. Cervical medullary junction, pituitary region and pineal region unremarkable. MRA HEAD FINDINGS Anterior circulation without medium or large size vessel significant stenosis or occlusion. Right anterior cerebral artery smaller than the left. Fetal type contribution to the left posterior cerebral artery. Right vertebral artery proximal to the right posterior inferior cerebellar artery is not visualized. Narrowed irregular small right vertebral artery between the right posterior inferior cerebellar artery and formation of the basilar artery. Ectatic left vertebral artery without significant narrowing. Ectatic basilar artery with mild irregularity minimal narrowing. High-grade focal stenosis right posterior cerebral artery P1 -P2 junction. Mild narrowing proximal left posterior cerebral artery (proximally as well as involving distal branches). No aneurysm noted. IMPRESSION: MRI HEAD Exam is motion degraded. No acute infarct or intracranial hemorrhage. Remote infarct superior right lenticular nucleus/ corona radiata. Remote small right cerebellar infarct. Moderate chronic small vessel disease changes most notable periventricular region and right frontal lobe. Global atrophy. Prominent exophthalmos.  Prominent retrobulbar fat. Minimal to mild paranasal sinus mucosal thickening. Cervical spondylotic changes with C3-4 bulge/ protrusion, spinal stenosis and mild cord flattening. Cervical spine MR can be obtained further delineation if clinically desired. MRA HEAD Right vertebral artery proximal to the right posterior inferior cerebellar artery is not visualized. Narrowed irregular small right vertebral artery between the right posterior inferior cerebellar artery and formation of the basilar artery. Ectatic left vertebral artery without significant narrowing. Ectatic basilar artery with mild irregularity and minimal narrowing. High-grade focal stenosis right posterior cerebral artery P1 -P2 junction.  Mild narrowing proximal left posterior cerebral artery (proximally as well as involving distal branches). Anterior circulation without medium or large size vessel significant stenosis or occlusion. Right anterior cerebral artery smaller than the left. Electronically Signed   By: Genia Del M.D.   On: 10/26/2015 08:32   US Carotid Bilateral  10/25/2015  CLINICAL DATA:  Slurred speech EXAM: BILATERAL CAROTID DUPLEX ULTRASOUND TECHNIQUE: Pearline Cables scale imaging, color Doppler and duplex ultrasound were performed of bilateral carotid and vertebral arteries in the neck. COMPARISON:  None. FINDINGS: Criteria: Quantification of carotid stenosis is based on velocity parameters that correlate the residual internal carotid diameter with NASCET-based stenosis levels, using the diameter of the distal internal carotid lumen as the denominator for stenosis measurement. The following velocity measurements were obtained: RIGHT ICA:  48 cm/sec CCA:  78 cm/sec SYSTOLIC ICA/CCA RATIO:  0.6 DIASTOLIC ICA/CCA RATIO:  0.6 ECA:  61 cm/sec LEFT ICA:  35 cm/sec CCA:  59 cm/sec SYSTOLIC ICA/CCA RATIO:  0.6 DIASTOLIC ICA/CCA RATIO:  1.1 ECA:  74 cm/sec RIGHT CAROTID ARTERY: Mild calcified plaque in the bulb. Low resistance internal carotid Doppler pattern. RIGHT VERTEBRAL ARTERY:  Antegrade.  Low resistance Doppler pattern. LEFT CAROTID ARTERY: Minimal plaque in the bulb. Low resistance internal carotid Doppler pattern. LEFT VERTEBRAL ARTERY:  Antegrade.  Low resistance Doppler pattern. IMPRESSION: Less than 50% stenosis in the right and left internal carotid arteries. Electronically Signed   By: Marybelle Killings M.D.   On: 10/25/2015 14:01   Dg Chest Portable 1 View  10/25/2015  CLINICAL DATA:  Shortness of breath, history coronary artery disease post MI and coronary stenting, type II diabetes mellitus, hypertension, ischemic cardiomyopathy, atrial fibrillation EXAM: PORTABLE CHEST 1 VIEW  COMPARISON:  Portable exam 2340 hours compared to  01/04/2011 FINDINGS: Enlargement of cardiac silhouette with pulmonary vascular congestion. Azygos fissure noted. Mild perihilar infiltrates likely pulmonary edema and CHF. No gross pleural effusion or pneumothorax. Examination limited secondary to body habitus. IMPRESSION: Enlargement of cardiac silhouette with vascular congestion and pulmonary infiltrates most likely representing pulmonary edema/CHF. Electronically Signed   By: Lavonia Dana M.D.   On: 10/25/2015 00:05   Mr Jodene Nam Head/brain Wo Cm  10/26/2015  CLINICAL DATA:  70 year old diabetic hypertensive male with transient dysarthria and confusion. Subsequent encounter. EXAM: MRI HEAD WITHOUT CONTRAST MRA HEAD WITHOUT CONTRAST TECHNIQUE: Multiplanar, multiecho pulse sequences of the brain and surrounding structures were obtained without intravenous contrast. Angiographic images of the head were obtained using MRA technique without contrast. COMPARISON:  10/24/2015 CT.  No comparison MR. FINDINGS: MRI HEAD FINDINGS Exam is motion degraded. No acute infarct or intracranial hemorrhage. Remote infarct superior right lenticular nucleus/ corona radiata. Remote small right cerebellar infarct. Moderate chronic small vessel disease changes most notable periventricular region and right frontal lobe. Global atrophy without hydrocephalus. No intracranial mass lesion noted on this unenhanced exam. Prominent exophthalmos. Prominent retrobulbar fat. Symmetric extra-ocular muscles. Minimal to mild paranasal sinus mucosal thickening. Cervical spondylotic changes with C3-4 bulge/ protrusion, spinal stenosis and mild cord flattening. Cervical spine MR can be obtained further delineation if clinically desired. Cervical medullary junction, pituitary region and pineal region unremarkable. MRA HEAD FINDINGS Anterior circulation without medium or large size vessel significant stenosis or occlusion. Right anterior cerebral artery smaller than the left. Fetal type contribution to the  left posterior cerebral artery. Right vertebral artery proximal to the right posterior inferior cerebellar artery is not visualized. Narrowed irregular small right vertebral artery between the right posterior inferior cerebellar artery and formation of the basilar artery. Ectatic left vertebral artery without significant narrowing. Ectatic basilar artery with mild irregularity minimal narrowing. High-grade focal stenosis right posterior cerebral artery P1 -P2 junction. Mild narrowing proximal left posterior cerebral artery (proximally as well as involving distal branches). No aneurysm noted. IMPRESSION: MRI HEAD Exam is motion degraded. No acute infarct or intracranial hemorrhage. Remote infarct superior right lenticular nucleus/ corona radiata. Remote small right cerebellar infarct. Moderate chronic small vessel disease changes most notable periventricular region and right frontal lobe. Global atrophy. Prominent exophthalmos.  Prominent retrobulbar fat. Minimal to mild paranasal sinus mucosal thickening. Cervical spondylotic changes with C3-4 bulge/ protrusion, spinal stenosis and mild cord flattening. Cervical spine MR can be obtained further delineation if clinically desired. MRA HEAD Right vertebral artery proximal to the right posterior inferior cerebellar artery is not visualized. Narrowed irregular small right vertebral artery between the right posterior inferior cerebellar artery and formation of the basilar artery. Ectatic left vertebral artery without significant narrowing. Ectatic basilar artery with mild irregularity and minimal narrowing. High-grade focal stenosis right posterior cerebral artery P1 -P2 junction. Mild narrowing proximal left posterior cerebral artery (proximally as well as involving distal branches). Anterior circulation without medium or large size vessel significant stenosis or occlusion. Right anterior cerebral artery smaller than the left. Electronically Signed   By: Genia Del  M.D.   On: 10/26/2015 08:32    ASSESSMENT AND PLAN:    Active Problems:   Type II diabetes mellitus with peripheral circulatory disorder (HCC)   Hyperlipidemia   Essential hypertension   CAD (coronary artery disease), native coronary artery   OSA (obstructive sleep apnea)   Atrial fibrillation (HCC)   TIA (transient ischemic attack)   Acute on chronic  systolic CHF (congestive heart failure) ([place])   Acute respiratory failure with hypoxia ([place])    CLOYDE [name] is a 70 y.o. male with a history of CAD s/p DES to LAD (2004); ant STEMI s/p PTCA for instent thrombosis (2008), ischemic CM (EF 35-40%), chronic afib on pradaxa, HTN, HLD, DMT2 and obesity who was transferred from [facility] on 10/24[facility]15 with dysarthria and confusion. He was seen by neurology and felt to have TIA. Cardiology consulted as he was set up for outpatient cardiac cath on 10/27/15.  CAD/recent crescendo angina: [name] set him up for outpatient [facility] on 10/28/15. He has been asymptomatic while admitted here for TIA workup but has not been out of hospital bed. He admits to continued exertional angina/dyspnea at low levels of activity previous to admission  -- Continue ASA, plavix, BB, statin and imdur  -- I personally spoke with [name] who said that he would like him to be kept in the hospital until his diagnostic cath. I have stopped his Pradaxa ( he did get his dose this AM) but Dr Burt Knack thinks this will be okay if he does cath from radial artery. I will start IV heparin in the setting of permanent afib and possible TIA.   Permanent afib: as above. Holding Pradaxa for planned cardiac cath on Wednesday @ 10am. Last dose today at 9:46am. I will start IV heparin in the meantime with possible TIA. Rate well controlled this AM on Coreg 31m BID  Chronic systolic CHF: 2D ECHO this admission with EF 35-40% w/ anteroseptal, anterior, apical and inferoapical akinesissuggestive of prior LAD infarct. He is currently on IV lasix  25mBID. Currently compensated, continue ARB, Lasix, Aldactone and Coreg.  HTN: BP well controlled today. Yesterday pressures quite elevated. Continue to monitor  OSA: continue CPAP  Dispo: if his TIA work up is complete and he is ready for discharge, we are happy to transfer him to our service. Thank you.   Signed: Eileen StanfordPA-C 10/26/2015 11:11 AM  Pager [phone]-Sign MD  Patient seen, examined. Available data reviewed. Agree with findings, assessment, and plan as outlined by KaAngelena FormPA-C. Exam reveals pleasant, obese male in NAD, sitting up in chair. Lungs with bibasilar rales, heart irregular without murmur, abdomen obese, extremities with 1+ edema. Plan to transition from Pradaxa (last dose this am) to heparin for cath on Wednesday. Considering afib and TIA it seems best to keep him anticoagulated with the shortest possible window off of anticoagulation. Agree with IV lasix for treatment of acute on chronic systolic CHF. Will follow renal fxn/electrolytes closely. Discussed plan with patient, his wife who is at the bedside, and his granddaughter (who is an RNTherapist, sportsvia telephone.   MiSherren MochaM.D. 10/26/2015 8:01 PM

## 2015-10-26 NOTE — Evaluation (Signed)
Physical Therapy Evaluation Patient Details Name: [name] MRN: [id number] DOB: 09-30-1946 Today's Date: 10/26/2015   History of Present Illness  70 y.o. male admitted for confusion and slurred speech. 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 PMH significant for CAD, atrial fibrillation, DM type II, HTN, hyperlipidemia, hypothyroidism, MI (2008), obesity, sleep apnea, ulcerative colitis, B12 deficiency, and diverticulosis of colon.     Clinical Impression  Patient evaluated by Physical Therapy with no further acute PT needs identified. Patient is independent with ambulation and balance, however does have DOE and required 2L O2 to maintain saturations. (See O2 qualification note). All education has been completed and the patient has no further questions. PT is signing off. Thank you for this referral.     Follow Up Recommendations No PT follow up    Equipment Recommendations   (? needs Home O2)    Recommendations for Other Services  (continue Cardiac Rehab when OK'd by MD) per pt he is scheduled for angioplasty with Dr.Cooper 1/18    Precautions / Restrictions Precautions Precautions: None Restrictions Weight Bearing Restrictions: No      Mobility  Bed Mobility                Transfers Overall transfer level: Modified independent Equipment used: None             General transfer comment: All transfers mod I with no overt LOB  Ambulation/Gait Ambulation/Gait assistance: Independent Ambulation Distance (Feet): 250 Feet (x 3 (for O2 assessment)) Assistive device: None Gait Pattern/deviations: WFL(Within Functional Limits);Wide base of support   Gait velocity interpretation: at or above normal speed for age/gender General Gait Details: +dyspnea  Stairs Stairs: Yes Stairs assistance: Independent Stair Management: No rails;Alternating pattern;Forwards Number of Stairs: 4 (x2) General  stair comments: incr dyspnea  Wheelchair Mobility    Modified Rankin (Stroke Patients Only) Modified Rankin (Stroke Patients Only) Pre-Morbid Rankin Score: No symptoms Modified Rankin: No symptoms     Balance Overall balance assessment: Needs assistance Sitting-balance support: No upper extremity supported;Feet supported Sitting balance-Leahy Scale: Good    Standing balance support: No upper extremity supported;During functional activity Standing balance-Leahy Scale: Good                   Standardized Balance Assessment Standardized Balance Assessment : Dynamic Gait Index   Dynamic Gait Index Level Surface: Normal Change in Gait Speed: Normal Gait with Horizontal Head Turns: Normal Gait with Vertical Head Turns: Normal Gait and Pivot Turn: Normal Step Over Obstacle: Normal Step Around Obstacles: Normal Steps: Normal Total Score: 24       Pertinent Vitals/Pain Pain Assessment: No/denies pain    Home Living Family/patient expects to be discharged to:: Private residence Living Arrangements: Spouse/significant other Available Help at Discharge: Family;Available 24 hours/day Type of Home: House Home Access: Stairs to enter Entrance Stairs-Rails: None Entrance Stairs-Number of Steps: 2 Home Layout: One level Home Equipment: Bedside commode;Adaptive equipment      Prior Function Level of Independence: Independent         Comments: Attends Cardiac Rehab classes 2-3x/wk x 7 yrs; Works part time Radiation protection practitioner for [employer] Recently has become SOB with physical exertion.      Hand Dominance   Dominant Hand: Right    Extremity/Trunk Assessment   Upper Extremity Assessment: Overall WFL for tasks assessed           Lower Extremity Assessment: Overall WFL for  tasks assessed      Cervical / Trunk Assessment: Other exceptions  Communication   Communication: No difficulties  Cognition Arousal/Alertness: Awake/alert Behavior During  Therapy: WFL for tasks assessed/performed Overall Cognitive Status: Within Functional Limits for tasks assessed                      General Comments      Exercises        Assessment/Plan    PT Assessment Patent does not need any further PT services  PT Diagnosis Difficulty walking   PT Problem List    PT Treatment Interventions     PT Goals (Current goals can be found in the Care Plan section) Acute Rehab PT Goals Patient Stated Goal: to see cardiologist and fix problem causing SOB PT Goal Formulation: All assessment and education complete, DC therapy    Frequency     Barriers to discharge        Co-evaluation               End of Session Equipment Utilized During Treatment: Oxygen Activity Tolerance: Treatment limited secondary to medical complications (Comment) Patient left: in chair;with call bell/phone within reach;with family/visitor present Nurse Communication: Mobility status (decr SaO2 with activity)         Time: 2376-2831 PT Time Calculation (min) (ACUTE ONLY): 37 min   Charges:   PT Evaluation $PT Eval Moderate Complexity: 1 Procedure PT Treatments $Gait Training: 8-22 mins   PT G Codes:        [name] 11-20-15, 11:04 AM Pager [phone]

## 2015-10-27 DIAGNOSIS — I25110 Atherosclerotic heart disease of native coronary artery with unstable angina pectoris: Secondary | ICD-10-CM

## 2015-10-27 LAB — BASIC METABOLIC PANEL
ANION GAP: 10 (ref 5–15)
BUN: 15 mg/dL (ref 6–20)
CO2: 29 mmol/L (ref 22–32)
Calcium: 8.6 mg/dL — ABNORMAL LOW (ref 8.9–10.3)
Chloride: 98 mmol/L — ABNORMAL LOW (ref 101–111)
Creatinine, Ser: 1.04 mg/dL (ref 0.61–1.24)
Glucose, Bld: 174 mg/dL — ABNORMAL HIGH (ref 65–99)
POTASSIUM: 3.9 mmol/L (ref 3.5–5.1)
SODIUM: 137 mmol/L (ref 135–145)

## 2015-10-27 LAB — CBC
HCT: 40.9 % (ref 39.0–52.0)
Hemoglobin: 13.3 g/dL (ref 13.0–17.0)
MCH: 27.3 pg (ref 26.0–34.0)
MCHC: 32.5 g/dL (ref 30.0–36.0)
MCV: 84 fL (ref 78.0–100.0)
PLATELETS: 167 10*3/uL (ref 150–400)
RBC: 4.87 MIL/uL (ref 4.22–5.81)
RDW: 14 % (ref 11.5–15.5)
WBC: 9.3 10*3/uL (ref 4.0–10.5)

## 2015-10-27 LAB — GLUCOSE, CAPILLARY
GLUCOSE-CAPILLARY: 163 mg/dL — AB (ref 65–99)
GLUCOSE-CAPILLARY: 139 mg/dL — AB (ref 65–99)
Glucose-Capillary: 126 mg/dL — ABNORMAL HIGH (ref 65–99)
Glucose-Capillary: 100 mg/dL — ABNORMAL HIGH (ref 65–99)

## 2015-10-27 LAB — HEPARIN LEVEL (UNFRACTIONATED)
HEPARIN UNFRACTIONATED: 0.2 [IU]/mL — AB (ref 0.30–0.70)
HEPARIN UNFRACTIONATED: 0.19 [IU]/mL — AB (ref 0.30–0.70)
HEPARIN UNFRACTIONATED: 0.46 [IU]/mL (ref 0.30–0.70)

## 2015-10-27 MED ORDER — SODIUM CHLORIDE 0.9 % IJ SOLN: 3.0000 mL | INTRAMUSCULAR | Status: DC | PRN

## 2015-10-27 MED ORDER — SODIUM CHLORIDE 0.9 % IV SOLN: 250.0000 mL | INTRAVENOUS | Status: DC | PRN

## 2015-10-27 MED ORDER — ASPIRIN 81 MG PO CHEW
81.0000 mg | CHEWABLE_TABLET | ORAL | Status: AC
  Administered 2015-10-28: 81 mg via ORAL
  Filled 2015-10-27: qty 1

## 2015-10-27 MED ORDER — SODIUM CHLORIDE 0.9 % IV SOLN
INTRAVENOUS | Status: DC
  Administered 2015-10-27: 23:00:00 via INTRAVENOUS

## 2015-10-27 MED ORDER — MESALAMINE ER 0.375 G PO CP24
1.5000 g | ORAL_CAPSULE | Freq: Every day | ORAL | Status: DC
  Administered 2015-10-27: 1.5 g via ORAL
  Filled 2015-10-27: qty 4

## 2015-10-27 MED ORDER — SODIUM CHLORIDE 0.9 % IJ SOLN
3.0000 mL | Freq: Two times a day (BID) | INTRAMUSCULAR | Status: DC
  Administered 2015-10-27: 3 mL via INTRAVENOUS

## 2015-10-27 NOTE — Progress Notes (Signed)
    SUBJECTIVE:  No chest pain.  No SOB.     PHYSICAL EXAM Filed Vitals:   10/26/15 1759 10/26/15 2102 10/27/15 0118 10/27/15 0628  BP: 143/83 127/74 145/88 167/90  Pulse: 74 77 80 84  Temp: 98.4 F (36.9 C) 97.9 F (36.6 C) 98 F (36.7 C) 98.2 F (36.8 C)  TempSrc: Oral Oral Oral Oral  Resp: 18 18 18 20   Height:      Weight:      SpO2: 94% 92% 92% 90%   General:  No distress Lungs:  Clear Heart:  Irregular Abdomen:  Positive bowel sounds, no rebound no guarding Extremities:  No edema   LABS: Lab Results  Component Value Date   TROPONINI <0.03 10/24/2015   Results for orders placed or performed during the hospital encounter of 10/24/15 (from the past 24 hour(s))  Glucose, capillary     Status: Abnormal   Collection Time: 10/26/15 11:29 AM  Result Value Ref Range   Glucose-Capillary 128 (H) 65 - 99 mg/dL  Glucose, capillary     Status: Abnormal   Collection Time: 10/26/15  4:52 PM  Result Value Ref Range   Glucose-Capillary 132 (H) 65 - 99 mg/dL   Comment 1 Notify RN    Comment 2 Document in Chart   Glucose, capillary     Status: None   Collection Time: 10/26/15  8:57 PM  Result Value Ref Range   Glucose-Capillary 95 65 - 99 mg/dL   Comment 1 Notify RN    Comment 2 Document in Chart   Heparin level (unfractionated)     Status: Abnormal   Collection Time: 10/27/15  5:40 AM  Result Value Ref Range   Heparin Unfractionated 0.20 (L) 0.30 - 0.70 IU/mL  CBC     Status: None   Collection Time: 10/27/15  5:40 AM  Result Value Ref Range   WBC 9.3 4.0 - 10.5 K/uL   RBC 4.87 4.22 - 5.81 MIL/uL   Hemoglobin 13.3 13.0 - 17.0 g/dL   HCT 40.9 39.0 - 52.0 %   MCV 84.0 78.0 - 100.0 fL   MCH 27.3 26.0 - 34.0 pg   MCHC 32.5 30.0 - 36.0 g/dL   RDW 14.0 11.5 - 15.5 %   Platelets 167 150 - 400 K/uL  Glucose, capillary     Status: Abnormal   Collection Time: 10/27/15  6:28 AM  Result Value Ref Range   Glucose-Capillary 126 (H) 65 - 99 mg/dL   Comment 1 Notify RN    Comment 2 Document in Chart    *Note: Due to a large number of results and/or encounters for the requested time period, some results have not been displayed. A complete set of results can be found in Results Review.   No intake or output data in the 24 hours ending 10/27/15 0940   ASSESSMENT AND PLAN:  CAD/Crescendo angina:  Cath planned for Wed.  Holding Pradaxa.  Bridge with heparin.    Permanent atrial fib:  As above.  Chronic systolic HF:     Seems to be euvolemic.  Continue current therapy.  I don't see a BMET.  I will order this.  I will hold his Lasix tonight and tomorrow AM .    HTN:  BP creeping up slightly.  Continue current meds and follow.        Jeneen Rinks [facility] 10/27/2015 9:40 AM

## 2015-10-27 NOTE — Progress Notes (Addendum)
Patient Demographics:    [name], is a 70 y.o. 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 His MRI, echogram and carotids were all nonacute. He was seen by neurology. Of note patient was already scheduled for outpatient left heart catheterization by [name] for evaluation of his CAD symptoms on 10/28/2015. Cardiology was consulted. He has now been placed on heparin drip instead of Pradaxa in preparation of left heart catheterization on 10/28/2015. He is currently completely symptom free.    Chief Complaint  Patient presents with  . Code Stroke        Subjective:    [name] today has, No headache, No chest pain, No abdominal pain - No Nausea, No new weakness tingling or numbness, No Cough - SOB.    Assessment  & Plan :   1. Resolved dysarthria. Could have been due to TIA. Currently on Pradaxa/Hep gtt, history of mural thrombus, also on Plavix and statin for secondary prevention. LDL at goal, 1 mL 7.6, neurology following. No stroke on MRI, generalized cerebral arterial disease noted we'll defer neurology for further management. Echo and carotid ultrasound nonacute. Seen by PT-OT and speech as well.  2. Hx of CAD. chest pain free, on Coreg, Plavix, Pradaxa (now on Hep  gtt) , statin, Imdur for secondary prevention. Cards consulted as he was due for left heart catheterization on 10/28/2015.  3. History of chronic atrial fibrillation with known intramural thrombus. Continue Coreg and Pradaxa for now.  4. OSA. CPAP at night.  5. Essential hypertension. Currently on Coreg, Lasix, hydralazine, ARB, Imdur which will be continued at home dose.  6. Mild acute on Chronic diastolic and systolic heart failure EF 35%. Currently compensated, continue ARB, Aldactone, Coreg combination. Lasix has been switched to IV by cardiology, defer management to cardiology.  7. Hypothyroidism. Continue home dose Synthroid.   8. DM type II. A1C mildly elevated, he is currently on sliding scale insulin along with glipizide and Linaglipitin.  Lab Results  Component Value Date   HGBA1C 7.6* 10/25/2015    CBG (last 3)   Recent Labs  10/26/15 2057 10/27/15 0628 10/27/15 1144  GLUCAP 95 126* 163*      Code Status : Full  Family Communication  : Wife and son bedside  Disposition Plan  : TBD  Consults  : Neuro, Cards  Procedures  :  CT head, MRI/MRA brain. No acute infarct. Some intracerebellar arterial sees noted.  Carotid ultrasound = is than 50% stenosis and bilateral ICA.  TTE  Left ventricle: Wall thickness was increased in a pattern ofsevere LVH, however, the anterior wall and apex are thinned andhyperechoic. Definity contrast opacifies the apex, there is nomural thrombus. Systolic function was moderately reduced. Theestimated ejection fraction was in  the range of 35% to 40%.Anteroseptal, anterior, apical and inferoapical akinesissuggestive of prior LAD infarct. The study is not technicallysufficient to allow evaluation of LV diastolic function. - Mitral valve: Mildly thickened leaflets . There was trivialregurgitation. - Left atrium: The atrium was mildly dilated. - Inferior vena cava: The vessel was dilated. The respirophasicdiameter changes were  blunted (< 50%), consistent with elevatedcentral venous pressure. - Pericardium, extracardiac: Features were not consistent withtamponade physiology.  Impressions:  Compared to a prior study in 07/2015, there has been no change.No mural thrombus.  DVT Prophylaxis  :  Pradaxa  Lab Results  Component Value Date   PLT 167 10/27/2015    Inpatient Medications  Scheduled Meds: . atorvastatin  20 mg Oral Daily  . carvedilol  25 mg Oral BID WC  . clopidogrel  75 mg Oral Daily  . glipiZIDE  10 mg Oral BID AC  . hydrALAZINE  25 mg Oral Once  . insulin aspart  0-15 Units Subcutaneous TID WC  . insulin aspart  0-5 Units Subcutaneous QHS  . irbesartan  300 mg Oral Daily  . isosorbide mononitrate  30 mg Oral Daily  . levothyroxine  50 mcg Oral QAC breakfast  . linagliptin  5 mg Oral Daily  . mesalamine  1.5 g Oral Daily  . potassium chloride SA  20 mEq Oral Daily  . spironolactone  12.5 mg Oral Daily   Continuous Infusions: . heparin 1,800 Units/hr (10/27/15 0811)   PRN Meds:.hydrALAZINE, HYDROcodone-acetaminophen, ondansetron (ZOFRAN) IV, senna-docusate  Antibiotics  :     Anti-infectives    None        Objective:   Filed Vitals:   10/26/15 2102 10/27/15 0118 10/27/15 0628 10/27/15 1030  BP: 127/74 145/88 167/90 133/95  Pulse: 77 80 84 67  Temp: 97.9 F (36.6 C) 98 F (36.7 C) 98.2 F (36.8 C) 98.3 F (36.8 C)  TempSrc: Oral Oral Oral Oral  Resp: 18 18 20 18   Height:      Weight:      SpO2: 92% 92% 90% 91%    Wt Readings from Last 3 Encounters:  10/25/15 132.36 kg (291 lb 12.8 oz)  10/21/15 133.085 kg (293 lb 6.4 oz)  09/07/15 137.712 kg (303 lb 9.6 oz)    No intake or output data in the 24 hours ending 10/27/15 1149   Physical Exam  Awake Alert, Oriented X 3, No new F.N deficits, Normal affect [place].AT,PERRAL Supple Neck,No JVD, No cervical lymphadenopathy appriciated.  Symmetrical Chest wall movement, Good air movement bilaterally, CTAB RRR,No  Gallops,Rubs or new Murmurs, No Parasternal Heave +ve B.Sounds, Abd Soft, No tenderness, No organomegaly appriciated, No rebound - guarding or rigidity. No Cyanosis, Clubbing or edema, No new Rash or bruise       Data Review:   Micro Results No results found for this or any previous visit (from the past 240 hour(s)).  Radiology Reports   Ct Head Wo Contrast  10/24/2015  CLINICAL DATA:  Acute onset of confusion and slurred speech. Left-sided headache. Initial encounter. EXAM: CT HEAD WITHOUT CONTRAST TECHNIQUE: Contiguous axial images were obtained from the base of the skull through the vertex without intravenous contrast. COMPARISON:  None. FINDINGS: There is no evidence of acute infarction, mass lesion, or intra- or extra-axial hemorrhage on CT. Prominence of the ventricles and sulci reflects mild to moderate cortical volume loss. Scattered periventricular and subcortical white matter change likely reflects small vessel ischemic microangiopathy. Small chronic lacunar infarcts are noted at the basal ganglia  bilaterally. Cerebellar atrophy is noted. The brainstem and fourth ventricle are within normal limits. The cerebral hemispheres demonstrate grossly normal gray-white differentiation. No mass effect or midline shift is seen. There is no evidence of fracture; visualized osseous structures are unremarkable in appearance. The visualized portions of the orbits are within normal limits. The paranasal sinuses and mastoid air cells are well-aerated. No significant soft tissue abnormalities are seen. IMPRESSION: 1. No acute intracranial pathology seen on CT. 2. Mild to moderate cortical volume loss and scattered small vessel ischemic microangiopathy. 3. Small chronic lacunar infarcts at the basal ganglia bilaterally. Electronically Signed   By: Garald Balding M.D.   On: 10/24/2015 19:35   Mr Brain Wo Contrast  10/26/2015  CLINICAL DATA:  70 year old diabetic hypertensive male with transient dysarthria and  confusion. Subsequent encounter. EXAM: MRI HEAD WITHOUT CONTRAST MRA HEAD WITHOUT CONTRAST TECHNIQUE: Multiplanar, multiecho pulse sequences of the brain and surrounding structures were obtained without intravenous contrast. Angiographic images of the head were obtained using MRA technique without contrast. COMPARISON:  10/24/2015 CT.  No comparison MR. FINDINGS: MRI HEAD FINDINGS Exam is motion degraded. No acute infarct or intracranial hemorrhage. Remote infarct superior right lenticular nucleus/ corona radiata. Remote small right cerebellar infarct. Moderate chronic small vessel disease changes most notable periventricular region and right frontal lobe. Global atrophy without hydrocephalus. No intracranial mass lesion noted on this unenhanced exam. Prominent exophthalmos. Prominent retrobulbar fat. Symmetric extra-ocular muscles. Minimal to mild paranasal sinus mucosal thickening. Cervical spondylotic changes with C3-4 bulge/ protrusion, spinal stenosis and mild cord flattening. Cervical spine MR can be obtained further delineation if clinically desired. Cervical medullary junction, pituitary region and pineal region unremarkable. MRA HEAD FINDINGS Anterior circulation without medium or large size vessel significant stenosis or occlusion. Right anterior cerebral artery smaller than the left. Fetal type contribution to the left posterior cerebral artery. Right vertebral artery proximal to the right posterior inferior cerebellar artery is not visualized. Narrowed irregular small right vertebral artery between the right posterior inferior cerebellar artery and formation of the basilar artery. Ectatic left vertebral artery without significant narrowing. Ectatic basilar artery with mild irregularity minimal narrowing. High-grade focal stenosis right posterior cerebral artery P1 -P2 junction. Mild narrowing proximal left posterior cerebral artery (proximally as well as involving distal branches). No aneurysm noted.  IMPRESSION: MRI HEAD Exam is motion degraded. No acute infarct or intracranial hemorrhage. Remote infarct superior right lenticular nucleus/ corona radiata. Remote small right cerebellar infarct. Moderate chronic small vessel disease changes most notable periventricular region and right frontal lobe. Global atrophy. Prominent exophthalmos.  Prominent retrobulbar fat. Minimal to mild paranasal sinus mucosal thickening. Cervical spondylotic changes with C3-4 bulge/ protrusion, spinal stenosis and mild cord flattening. Cervical spine MR can be obtained further delineation if clinically desired. MRA HEAD Right vertebral artery proximal to the right posterior inferior cerebellar artery is not visualized. Narrowed irregular small right vertebral artery between the right posterior inferior cerebellar artery and formation of the basilar artery. Ectatic left vertebral artery without significant narrowing. Ectatic basilar artery with mild irregularity and minimal narrowing. High-grade focal stenosis right posterior cerebral artery P1 -P2 junction. Mild narrowing proximal left posterior cerebral artery (proximally as well as involving distal branches). Anterior circulation without medium or large size vessel significant stenosis or occlusion. Right anterior cerebral artery smaller than the left. Electronically Signed   By: Genia Del M.D.   On: 10/26/2015 08:32   US Carotid Bilateral  10/25/2015  CLINICAL DATA:  Slurred speech EXAM: BILATERAL CAROTID DUPLEX ULTRASOUND  TECHNIQUE: Pearline Cables scale imaging, color Doppler and duplex ultrasound were performed of bilateral carotid and vertebral arteries in the neck. COMPARISON:  None. FINDINGS: Criteria: Quantification of carotid stenosis is based on velocity parameters that correlate the residual internal carotid diameter with NASCET-based stenosis levels, using the diameter of the distal internal carotid lumen as the denominator for stenosis measurement. The following velocity  measurements were obtained: RIGHT ICA:  48 cm/sec CCA:  78 cm/sec SYSTOLIC ICA/CCA RATIO:  0.6 DIASTOLIC ICA/CCA RATIO:  0.6 ECA:  61 cm/sec LEFT ICA:  35 cm/sec CCA:  59 cm/sec SYSTOLIC ICA/CCA RATIO:  0.6 DIASTOLIC ICA/CCA RATIO:  1.1 ECA:  74 cm/sec RIGHT CAROTID ARTERY: Mild calcified plaque in the bulb. Low resistance internal carotid Doppler pattern. RIGHT VERTEBRAL ARTERY:  Antegrade.  Low resistance Doppler pattern. LEFT CAROTID ARTERY: Minimal plaque in the bulb. Low resistance internal carotid Doppler pattern. LEFT VERTEBRAL ARTERY:  Antegrade.  Low resistance Doppler pattern. IMPRESSION: Less than 50% stenosis in the right and left internal carotid arteries. Electronically Signed   By: Marybelle Killings M.D.   On: 10/25/2015 14:01   Dg Chest Portable 1 View  10/25/2015  CLINICAL DATA:  Shortness of breath, history coronary artery disease post MI and coronary stenting, type II diabetes mellitus, hypertension, ischemic cardiomyopathy, atrial fibrillation EXAM: PORTABLE CHEST 1 VIEW COMPARISON:  Portable exam 2340 hours compared to 01/04/2011 FINDINGS: Enlargement of cardiac silhouette with pulmonary vascular congestion. Azygos fissure noted. Mild perihilar infiltrates likely pulmonary edema and CHF. No gross pleural effusion or pneumothorax. Examination limited secondary to body habitus. IMPRESSION: Enlargement of cardiac silhouette with vascular congestion and pulmonary infiltrates most likely representing pulmonary edema/CHF. Electronically Signed   By: Lavonia Dana M.D.   On: 10/25/2015 00:05   Mr Jodene Nam Head/brain Wo Cm  10/26/2015  CLINICAL DATA:  70 year old diabetic hypertensive male with transient dysarthria and confusion. Subsequent encounter. EXAM: MRI HEAD WITHOUT CONTRAST MRA HEAD WITHOUT CONTRAST TECHNIQUE: Multiplanar, multiecho pulse sequences of the brain and surrounding structures were obtained without intravenous contrast. Angiographic images of the head were obtained using MRA technique  without contrast. COMPARISON:  10/24/2015 CT.  No comparison MR. FINDINGS: MRI HEAD FINDINGS Exam is motion degraded. No acute infarct or intracranial hemorrhage. Remote infarct superior right lenticular nucleus/ corona radiata. Remote small right cerebellar infarct. Moderate chronic small vessel disease changes most notable periventricular region and right frontal lobe. Global atrophy without hydrocephalus. No intracranial mass lesion noted on this unenhanced exam. Prominent exophthalmos. Prominent retrobulbar fat. Symmetric extra-ocular muscles. Minimal to mild paranasal sinus mucosal thickening. Cervical spondylotic changes with C3-4 bulge/ protrusion, spinal stenosis and mild cord flattening. Cervical spine MR can be obtained further delineation if clinically desired. Cervical medullary junction, pituitary region and pineal region unremarkable. MRA HEAD FINDINGS Anterior circulation without medium or large size vessel significant stenosis or occlusion. Right anterior cerebral artery smaller than the left. Fetal type contribution to the left posterior cerebral artery. Right vertebral artery proximal to the right posterior inferior cerebellar artery is not visualized. Narrowed irregular small right vertebral artery between the right posterior inferior cerebellar artery and formation of the basilar artery. Ectatic left vertebral artery without significant narrowing. Ectatic basilar artery with mild irregularity minimal narrowing. High-grade focal stenosis right posterior cerebral artery P1 -P2 junction. Mild narrowing proximal left posterior cerebral artery (proximally as well as involving distal branches). No aneurysm noted. IMPRESSION: MRI HEAD Exam is motion degraded. No acute infarct or intracranial hemorrhage. Remote infarct superior right lenticular nucleus/ corona radiata. Remote  small right cerebellar infarct. Moderate chronic small vessel disease changes most notable periventricular region and right  frontal lobe. Global atrophy. Prominent exophthalmos.  Prominent retrobulbar fat. Minimal to mild paranasal sinus mucosal thickening. Cervical spondylotic changes with C3-4 bulge/ protrusion, spinal stenosis and mild cord flattening. Cervical spine MR can be obtained further delineation if clinically desired. MRA HEAD Right vertebral artery proximal to the right posterior inferior cerebellar artery is not visualized. Narrowed irregular small right vertebral artery between the right posterior inferior cerebellar artery and formation of the basilar artery. Ectatic left vertebral artery without significant narrowing. Ectatic basilar artery with mild irregularity and minimal narrowing. High-grade focal stenosis right posterior cerebral artery P1 -P2 junction. Mild narrowing proximal left posterior cerebral artery (proximally as well as involving distal branches). Anterior circulation without medium or large size vessel significant stenosis or occlusion. Right anterior cerebral artery smaller than the left. Electronically Signed   By: Genia Del M.D.   On: 10/26/2015 08:32     CBC  Recent Labs Lab 10/21/15 1701 10/24/15 1917 10/24/15 1943 10/27/15 0540  WBC 10.6* 8.7  --  9.3  HGB 13.8 13.0 13.9 13.3  HCT 42.3 40.1 41.0 40.9  PLT 183 155  --  167  MCV 81.5 83.9  --  84.0  MCH 26.6 27.2  --  27.3  MCHC 32.6 32.4  --  32.5  RDW 15.4 14.1  --  14.0  LYMPHSABS  --  0.8  --   --   MONOABS  --  0.7  --   --   EOSABS  --  0.1  --   --   BASOSABS  --  0.0  --   --     Chemistries   Recent Labs Lab 10/21/15 1701 10/24/15 1917 10/24/15 1943  NA 139 141 141  K 4.3 4.0 3.9  CL 103 105 102  CO2 27 27  --   GLUCOSE 93 208* 207*  BUN 13 15 14   CREATININE 1.03 0.88 0.80  CALCIUM 9.0 8.7*  --   AST  --  15  --   ALT  --  11*  --   ALKPHOS  --  75  --   BILITOT  --  0.6  --     ------------------------------------------------------------------------------------------------------------------  Recent Labs  10/25/15 0518  CHOL 79  HDL 47  LDLCALC 16  TRIG 79  CHOLHDL 1.7    Lab Results  Component Value Date   HGBA1C 7.6* 10/25/2015   ------------------------------------------------------------------------------------------------------------------ No results for input(s): TSH, T4TOTAL, T3FREE, THYROIDAB in the last 72 hours.  Invalid input(s): FREET3 ------------------------------------------------------------------------------------------------------------------ No results for input(s): VITAMINB12, FOLATE, FERRITIN, TIBC, IRON, RETICCTPCT in the last 72 hours.  Coagulation profile  Recent Labs Lab 10/21/15 1701 10/24/15 1917  INR 1.57* 1.96*    No results for input(s): DDIMER in the last 72 hours.  Cardiac Enzymes  Recent Labs Lab 10/24/15 1917  TROPONINI <0.03   ------------------------------------------------------------------------------------------------------------------    Component Value Date/Time   BNP 157.0* 10/24/2015 1917    Time Spent in minutes   35   SINGH,PRASHANT K M.D on 10/27/2015 at 11:49 AM  Between 7am to 7pm - Pager - [phone]  After 7pm go to [email or web address] - password [email or web address]  [facility] -  Office  [phone]

## 2015-10-27 NOTE — Care Management Important Message (Signed)
Important Message  Patient Details  Name: TOSH GLAZE MRN: [id number] Date of Birth: 1946/05/21   Medicare Important Message Given:  Yes    Reo Portela P Gardenia Witter 10/27/2015, 3:28 PM

## 2015-10-27 NOTE — Progress Notes (Addendum)
[place] for Pradaxa to Heparin Indication: atrial fibrillation  No Known Allergies  Patient Measurements: Height: 5' 8"  (172.7 cm) Weight: 291 lb 12.8 oz (132.36 kg) IBW/kg (Calculated) : 68.4  Heparin dosing weight = 101 kg  Vital Signs: Temp: 98.3 F (36.8 C) (01/17 1347) Temp Source: Oral (01/17 1347) BP: 136/77 mmHg (01/17 1347) Pulse Rate: 72 (01/17 1347)  Labs:  Recent Labs  10/24/15 1917 10/24/15 1943 10/27/15 0540 10/27/15 1140 10/27/15 1337  HGB 13.0 13.9 13.3  --   --   HCT 40.1 41.0 40.9  --   --   PLT 155  --  167  --   --   APTT 55*  --   --   --   --   LABPROT 22.2*  --   --   --   --   INR 1.96*  --   --   --   --   HEPARINUNFRC  --   --  0.20*  --  0.19*  CREATININE 0.88 0.80  --  1.04  --   TROPONINI <0.03  --   --   --   --     Estimated Creatinine Clearance: 89.1 mL/min (by C-G formula based on Cr of 1.04).   Medical History: Past Medical History  Diagnosis Date  . CORONARY ARTERY DISEASE 04/12/2007    2 stents last in 2006.   Marland Kitchen DIABETES MELLITUS, TYPE II 04/16/2007  . HYPERLIPIDEMIA 04/16/2007    reveal study. not sure of medication  . HYPERTENSION 04/12/2007  . HYPOTHYROIDISM 04/12/2007  . MYOCARDIAL INFARCTION, HX OF 04/12/2007  . OBESITY 06/16/2009  . SLEEP APNEA 04/12/2007    CPAP  . ULCERATIVE COLITIS, LEFT SIDED 11/23/2010  . Ischemic cardiomyopathy     cath 35-40%  . Atrial fibrillation ([place])     initial diagnoses 2012  . B12 deficiency     per patient previously taking shots  . Diverticulosis of colon (without mention of hemorrhage)     Assessment: 70 year old male to transition from Pradaxa to heparin in preparation for cath On Pradaxa PTA for Afib, last dose at 10 am  PM HL still low at 0.19  Goal of Therapy:  Heparin level 0.3-0.7 units/ml Monitor platelets by anticoagulation protocol: Yes   Plan:  Heparin drip to 2000 units / hr Follow up 6 hour heparin level Daily heparin level,  CBC  Thank you Anette Guarneri, PharmD [phone]  10/27/2015,2:19 PM   ====================   Addendum: - HL 0.46, therapeutic - no bleeding reported   Plan: - Continue heparin gtt at 2000 units/hr - F/U AM labs    Jalayiah Bibian D. Mina Marble, PharmD, BCPS Pager:  [phone] 10/27/2015, 10:02 PM

## 2015-10-27 NOTE — Evaluation (Signed)
SLP Cancellation Note  Patient Details Name: [name] MRN: [id number] DOB: 08-13-1946   Cancelled treatment:       Reason Eval/Treat Not Completed: SLP screened, no needs identified, will sign off   Claudie Fisherman, [facility] SLP [phone]

## 2015-10-27 NOTE — Progress Notes (Signed)
ANTICOAGULATION CONSULT NOTE - Follow Up Consult  Pharmacy Consult for heparin Indication: atrial fibrillation  Labs:  Recent Labs  10/24/15 1917 10/24/15 1943 10/27/15 0540  HGB 13.0 13.9 13.3  HCT 40.1 41.0 40.9  PLT 155  --  167  APTT 55*  --   --   LABPROT 22.2*  --   --   INR 1.96*  --   --   HEPARINUNFRC  --   --  0.20*  CREATININE 0.88 0.80  --   TROPONINI <0.03  --   --     Assessment: 70yo male subtherapeutic on heparin with initial dosing while Pradaxa held.  Goal of Therapy:  Heparin level 0.3-0.7 units/ml   Plan:  Will increase heparin gtt by 2-3 units/kg/hr to 1800 units/hr and check level in 6hr.  Wynona Neat, PharmD, BCPS  10/27/2015,7:04 AM

## 2015-10-27 NOTE — Telephone Encounter (Signed)
Dr Burt Knack saw the pt 10/26/15 at [facility].

## 2015-10-27 NOTE — Progress Notes (Signed)
STROKE TEAM PROGRESS NOTE   HISTORY [name] is an 70 y.o. male with a past medical history significant for HTN, HLD, CAD, MI, ischemic cardiomyopathy, atrial fibrillation on pradaxa, OSA on CPAP, transferred to [facility] for TIA work up. Wife is at the bedside and stated that 2 days ago they were home watching TV when she noted that her husband was having trouble operating the remote control. She said that he was having a great deal of difficulty putting the channel that he wanted to watch and was " obviously confused, saying I can not make it to work properly". Wife also said that his speech was slurred and he was hesitant getting his words out. His grandson was home and he called EMS. By the time they arrive to [facility] the symptoms had subsided, and he estimate that the entire episode lasted about 2 hours off an on. He developed severe pain around the left eye and vomiting in the ED but denies vertigo, double vision, focal weakness or numbness, or visual disturbances. CT head while in the ED showed no acute abnormality and he was subsequently transferred to [facility] where he had MRI/MRA brain that showed no acute abnormality.  Total cholesterol 79, LDL 16, HDL 47, triglycerides 79.  TTE: EF 35-40%, no mural thrombus, left atrium mildly enlarged.   He was last known well 8/46/65, time uncertain. Patient was not administered TPA secondary to symptoms resolved.    SUBJECTIVE (INTERVAL HISTORY) His wife and friend [name] are at the bedside.  Overall he feels his condition is completely resolved. He reports he came to the hospital because he was "crazy". Wife reports he got worse after arrival to [facility].    OBJECTIVE Temp:  [97.9 F (36.6 C)-98.4 F (36.9 C)] 98.3 F (36.8 C) (01/17 1030) Pulse Rate:  [67-84] 67 (01/17 1030) Cardiac Rhythm:  [-] Atrial fibrillation (01/17 0700) Resp:  [17-20] 18 (01/17 1030) BP: (127-167)/(74-95) 133/95 mmHg (01/17 1030) SpO2:  [90 %-94 %] 91 % (01/17 1030)  CBC:    Recent Labs Lab 10/24/15 1917 10/24/15 1943 10/27/15 0540  WBC 8.7  --  9.3  NEUTROABS 7.0  --   --   HGB 13.0 13.9 13.3  HCT 40.1 41.0 40.9  MCV 83.9  --  84.0  PLT 155  --  993    Basic Metabolic Panel:   Recent Labs Lab 10/21/15 1701 10/24/15 1917 10/24/15 1943  NA 139 141 141  K 4.3 4.0 3.9  CL 103 105 102  CO2 27 27  --   GLUCOSE 93 208* 207*  BUN 13 15 14   CREATININE 1.03 0.88 0.80  CALCIUM 9.0 8.7*  --     Lipid Panel:     Component Value Date/Time   CHOL 79 10/25/2015 0518   TRIG 79 10/25/2015 0518   HDL 47 10/25/2015 0518   CHOLHDL 1.7 10/25/2015 0518   VLDL 16 10/25/2015 0518   LDLCALC 16 10/25/2015 0518   HgbA1c:  Lab Results  Component Value Date   HGBA1C 7.6* 10/25/2015   Urine Drug Screen: No results found for: LABOPIA, COCAINSCRNUR, LABBENZ, AMPHETMU, THCU, LABBARB    IMAGING  US Carotid Bilateral 10/25/2015  Less than 50% stenosis in the right and left internal carotid arteries.   MRI HEAD  10/26/2015  Exam is motion degraded. No acute infarct or intracranial hemorrhage. Remote infarct superior right lenticular nucleus/ corona radiata. Remote small right cerebellar infarct. Moderate chronic small vessel disease changes most notable periventricular region and  right frontal lobe. Global atrophy. Prominent exophthalmos.  Prominent retrobulbar fat. Minimal to mild paranasal sinus mucosal thickening. Cervical spondylotic changes with C3-4 bulge/ protrusion, spinal stenosis and mild cord flattening. Cervical spine MR can be obtained further delineation if clinically desired.   MRA HEAD  10/26/2015  Right vertebral artery proximal to the right posterior inferior cerebellar artery is not visualized. Narrowed irregular small right vertebral artery between the right posterior inferior cerebellar artery and formation of the basilar artery. Ectatic left vertebral artery without significant narrowing. Ectatic basilar artery with mild irregularity and  minimal narrowing. High-grade focal stenosis right posterior cerebral artery P1 -P2 junction. Mild narrowing proximal left posterior cerebral artery (proximally as well as involving distal branches). Anterior circulation without medium or large size vessel significant stenosis or occlusion. Right anterior cerebral artery smaller than the left.   Carotid Doppler   Less than 50% stenosis in the right and left internal carotid Arteries.  2D Echocardiogram  - Procedure narrative: Transthoracic echocardiography. Imagequality was poor. The study was technically difficult, as aresult of body habitus. Intravenous contrast (Definity) wasadministered. - Left ventricle: Wall thickness was increased in a pattern ofsevere LVH, however, the anterior wall and apex are thinned andhyperechoic. Definity contrast opacifies the apex, there is no mural thrombus. Systolic function was moderately reduced. Theestimated ejection fraction was in the range of 35% to 40%.Anteroseptal, anterior, apical and inferoapical akinesissuggestive of prior LAD infarct. The study is not technicallysufficient to allow evaluation of LV diastolic function. - Mitral valve: Mildly thickened leaflets . There was trivialregurgitation. - Left atrium: The atrium was mildly dilated. - Inferior vena cava: The vessel was dilated. The respirophasic diameter changes were blunted (< 50%), consistent with elevated central venous pressure. - Pericardium, extracardiac: Features were not consistent with tamponade physiology. - Impressions:   Compared to a prior study in 07/2015, there has been no change. No mural thrombus.   PHYSICAL EXAM Pleasant middle-aged African-American male not in distress. . Afebrile. Head is nontraumatic. Neck is supple without bruit.    Cardiac exam no murmur or gallop. Lungs are clear to auscultation. Distal pulses are well felt. Neurological Exam ;  Awake  Alert oriented x 3. Normal speech and language.eye  movements full without nystagmus.fundi were not visualized. Vision acuity and fields appear normal. Pupils are equal reactive. Fundi were not visualized. Hearing is normal. Palatal movements are normal. Face symmetric. Tongue midline. Normal strength, tone, reflexes and coordination. Normal sensation. Gait deferred. ASSESSMENT/PLAN [name] is a 70 y.o. male with history of HTN, HLD, CAD, MI, ischemic cardiomyopathy, atrial fibrillation on pradaxa, OSA on CPAP, transferred from [facility] to [facility] for TIA work up, presenting with confusion, slurred speech 2 days ago. He did not receive IV t-PA due to symptoms resolved.   Syncope vs TIA  Resultant  Neuro symptoms resolved  MRI  No acute infarct  MRA  High grade R PCA stenosis  Carotid Doppler  No significant stenosis   2D Echo  No source of embolus   LDL 16  HgbA1c 7.6  Heparin drip for VTE prophylaxis Diet heart healthy/carb modified Room service appropriate?: Yes; Fluid consistency:: Thin  aspirin 81 mg daily, clopidogrel 75 mg daily and Pradaxa (dabigatran) twice a day prior to admission, now on clopidogrel 75 mg daily and heparin IV  Patient counseled to be compliant with his antithrombotic medications  Ongoing aggressive stroke risk factor management  Therapy recommendations:  No PT, or OT  Disposition:  Return home  Atrial Fibrillation  Home anticoagulation:  pradaxa  For cardiac cath Wed  Started on IV heparin, Pradaxa put on hold.  Hypertension  Stable  Hyperlipidemia  Home meds:  Atorvastatin 20, resumed in hospital  LDL 16, goal < 70  Continue statin at discharge  Diabetes type II  HgbA1c 7.6, goal < 7.0  Uncontrolled  Other Stroke Risk Factors  Advanced age  Former Cigarette smoker, quit smoking 48 years ago  ETOH use  Morbid Obesity, Body mass index is 44.38 kg/(m^2).   Coronary artery disease - crescendo angina, MI,  stent 2006  Obstructive sleep apnea, on CPAP at home  Acute on  chronic systolic CHF, chronic diastolic HF  Other Active Problems  Hypothyroidism  NOTHING FURTHER TO ADD FROM THE STROKE STANDPOINT  Ongoing risk factor control by Primary Care Physician  Stroke Service will sign off. Please call should any needs arise.  Follow-up Stroke Clinic at [facility] with Dr. Antony Contras in 2 months, order placed.  Hospital day # [place] for Pager information 10/27/2015 1:12 PM  I have personally examined this patient, reviewed notes, independently viewed imaging studies, participated in medical decision making and plan of care. I have made any additions or clarifications directly to the above note. Agree with note above.  She presented with transient episode of confusion and speech difficulty possibly at TIA and remains at risk for neurological worsening, recurrent stroke, TIA needs ongoing stroke evaluation and aggressive risk factor control. Continue Pradaxa after cardiac cath.  Antony Contras, MD Medical Director [facility] Pager: [phone] 10/27/2015 4:09 PM    To contact Stroke Continuity provider, please refer to [email or web address] After hours, contact General Neurology

## 2015-10-28 ENCOUNTER — Inpatient Hospital Stay (HOSPITAL_COMMUNITY): Payer: Medicare Other

## 2015-10-28 ENCOUNTER — Ambulatory Visit (HOSPITAL_COMMUNITY): Admission: RE | Admit: 2015-10-28 | Payer: Medicare Other | Source: Ambulatory Visit | Admitting: Cardiovascular Disease

## 2015-10-28 ENCOUNTER — Encounter (HOSPITAL_COMMUNITY)
Admission: EM | Disposition: A | Discharge status: Home / Self Care | Payer: Self-pay | Source: Home / Self Care | Attending: Internal Medicine

## 2015-10-28 DIAGNOSIS — G459 Transient cerebral ischemic attack, unspecified: Principal | ICD-10-CM

## 2015-10-28 DIAGNOSIS — I25119 Atherosclerotic heart disease of native coronary artery with unspecified angina pectoris: Secondary | ICD-10-CM | POA: Insufficient documentation

## 2015-10-28 DIAGNOSIS — J9601 Acute respiratory failure with hypoxia: Secondary | ICD-10-CM

## 2015-10-28 HISTORY — PX: CARDIAC CATHETERIZATION: SHX172

## 2015-10-28 LAB — GLUCOSE, CAPILLARY
GLUCOSE-CAPILLARY: 151 mg/dL — AB (ref 65–99)
GLUCOSE-CAPILLARY: 143 mg/dL — AB (ref 65–99)
GLUCOSE-CAPILLARY: 144 mg/dL — AB (ref 65–99)
Glucose-Capillary: 118 mg/dL — ABNORMAL HIGH (ref 65–99)
Glucose-Capillary: 179 mg/dL — ABNORMAL HIGH (ref 65–99)

## 2015-10-28 LAB — BASIC METABOLIC PANEL
Anion gap: 8 (ref 5–15)
BUN: 14 mg/dL (ref 6–20)
CHLORIDE: 102 mmol/L (ref 101–111)
CO2: 29 mmol/L (ref 22–32)
Calcium: 8.6 mg/dL — ABNORMAL LOW (ref 8.9–10.3)
Creatinine, Ser: 1.02 mg/dL (ref 0.61–1.24)
GFR calc Af Amer: >60 mL/min (ref >60)
GFR calc non Af Amer: >60 mL/min (ref >60)
Glucose, Bld: 153 mg/dL — ABNORMAL HIGH (ref 65–99)
POTASSIUM: 3.8 mmol/L (ref 3.5–5.1)
Sodium: 139 mmol/L (ref 135–145)

## 2015-10-28 LAB — PROTIME-INR
INR: 1.4 (ref 0.00–1.49)
PROTHROMBIN TIME: 17.3 s — AB (ref 11.6–15.2)

## 2015-10-28 LAB — CBC
HCT: 40.5 % (ref 39.0–52.0)
Hemoglobin: 13 g/dL (ref 13.0–17.0)
MCH: 26.9 pg (ref 26.0–34.0)
MCHC: 32.1 g/dL (ref 30.0–36.0)
MCV: 83.9 fL (ref 78.0–100.0)
PLATELETS: 156 10*3/uL (ref 150–400)
RBC: 4.83 MIL/uL (ref 4.22–5.81)
RDW: 14.1 % (ref 11.5–15.5)
WBC: 8.2 10*3/uL (ref 4.0–10.5)

## 2015-10-28 LAB — AMMONIA: AMMONIA: 30 umol/L (ref 9–35)

## 2015-10-28 LAB — HEPARIN LEVEL (UNFRACTIONATED): HEPARIN UNFRACTIONATED: 0.42 [IU]/mL (ref 0.30–0.70)

## 2015-10-28 SURGERY — RIGHT/LEFT HEART CATH AND CORONARY ANGIOGRAPHY
Anesthesia: LOCAL

## 2015-10-28 MED ORDER — SODIUM CHLORIDE 0.9 % IJ SOLN
3.0000 mL | Freq: Two times a day (BID) | INTRAMUSCULAR | Status: DC
  Administered 2015-10-28: 3 mL via INTRAVENOUS

## 2015-10-28 MED ORDER — HEPARIN (PORCINE) IN NACL 2-0.9 UNIT/ML-% IJ SOLN
INTRAMUSCULAR | Status: AC
  Filled 2015-10-28: qty 500

## 2015-10-28 MED ORDER — MIDAZOLAM HCL 2 MG/2ML IJ SOLN
INTRAMUSCULAR | Status: AC
  Filled 2015-10-28: qty 2

## 2015-10-28 MED ORDER — VERAPAMIL HCL 2.5 MG/ML IV SOLN
INTRAVENOUS | Status: AC
  Filled 2015-10-28: qty 2

## 2015-10-28 MED ORDER — HEPARIN (PORCINE) IN NACL 2-0.9 UNIT/ML-% IJ SOLN
INTRAMUSCULAR | Status: DC | PRN
  Administered 2015-10-28: 11:00:00

## 2015-10-28 MED ORDER — SODIUM CHLORIDE 0.9 % IJ SOLN: 3.0000 mL | INTRAMUSCULAR | Status: DC | PRN

## 2015-10-28 MED ORDER — FENTANYL CITRATE (PF) 100 MCG/2ML IJ SOLN
INTRAMUSCULAR | Status: DC | PRN
Start: 2015-10-28 — End: 2015-10-28
  Administered 2015-10-28: 25 ug via INTRAVENOUS

## 2015-10-28 MED ORDER — HEPARIN SODIUM (PORCINE) 1000 UNIT/ML IJ SOLN
INTRAMUSCULAR | Status: AC
  Filled 2015-10-28: qty 1

## 2015-10-28 MED ORDER — ACETAMINOPHEN 325 MG PO TABS: 650.0000 mg | ORAL_TABLET | ORAL | Status: DC | PRN

## 2015-10-28 MED ORDER — HEPARIN SODIUM (PORCINE) 1000 UNIT/ML IJ SOLN
INTRAMUSCULAR | Status: DC | PRN
  Administered 2015-10-28: 5000 [IU] via INTRAVENOUS

## 2015-10-28 MED ORDER — IOHEXOL 350 MG/ML SOLN
INTRAVENOUS | Status: DC | PRN
  Administered 2015-10-28: 120 mL via INTRA_ARTERIAL

## 2015-10-28 MED ORDER — HEPARIN (PORCINE) IN NACL 2-0.9 UNIT/ML-% IJ SOLN
INTRAMUSCULAR | Status: DC | PRN
  Administered 2015-10-28: 11:00:00 via INTRA_ARTERIAL

## 2015-10-28 MED ORDER — SODIUM CHLORIDE 0.9 % IV SOLN: 250.0000 mL | INTRAVENOUS | Status: DC | PRN

## 2015-10-28 MED ORDER — FENTANYL CITRATE (PF) 100 MCG/2ML IJ SOLN
INTRAMUSCULAR | Status: AC
  Filled 2015-10-28: qty 2

## 2015-10-28 MED ORDER — SODIUM CHLORIDE 0.9 % IV SOLN: INTRAVENOUS | Status: AC

## 2015-10-28 MED ORDER — DABIGATRAN ETEXILATE MESYLATE 150 MG PO CAPS
150.0000 mg | ORAL_CAPSULE | Freq: Two times a day (BID) | ORAL | Status: DC
  Administered 2015-10-28 – 2015-10-29 (×2): 150 mg via ORAL
  Filled 2015-10-28 (×2): qty 1

## 2015-10-28 MED ORDER — LIDOCAINE HCL (PF) 1 % IJ SOLN
INTRAMUSCULAR | Status: AC
  Filled 2015-10-28: qty 30

## 2015-10-28 MED ORDER — MIDAZOLAM HCL 2 MG/2ML IJ SOLN
INTRAMUSCULAR | Status: DC | PRN
  Administered 2015-10-28: 2 mg via INTRAVENOUS

## 2015-10-28 SURGICAL SUPPLY — 15 items
BANDAGE COBAN STERILE 2 (GAUZE/BANDAGES/DRESSINGS) ×2 IMPLANT
CATH INFINITI 5 FR JL3.5 (CATHETERS) ×2 IMPLANT
CATH INFINITI 5FR AL1 (CATHETERS) ×2 IMPLANT
CATH INFINITI 5FR ANG PIGTAIL (CATHETERS) ×2 IMPLANT
CATH INFINITI JR4 5F (CATHETERS) ×2 IMPLANT
CATH LAUNCHER 5F JR4 (CATHETERS) ×2 IMPLANT
DEVICE RAD COMP TR BAND LRG (VASCULAR PRODUCTS) ×2 IMPLANT
GLIDESHEATH SLEND SS 6F .021 (SHEATH) ×2 IMPLANT
KIT ESSENTIALS PG (KITS) ×2 IMPLANT
KIT HEART LEFT (KITS) ×2 IMPLANT
PACK CARDIAC CATHETERIZATION (CUSTOM PROCEDURE TRAY) ×2 IMPLANT
SYR MEDRAD MARK V 150ML (SYRINGE) ×2 IMPLANT
TRANSDUCER W/STOPCOCK (MISCELLANEOUS) ×2 IMPLANT
TUBING CIL FLEX 10 FLL-RA (TUBING) ×2 IMPLANT
WIRE SAFE-T 1.5MM-J .035X260CM (WIRE) ×2 IMPLANT

## 2015-10-28 NOTE — Progress Notes (Signed)
Pt has home CPAP and mask and was already setup and wearing when RT  Entered room.  Pt stated he didn't need anything at this time. RT will continue to monitor.

## 2015-10-28 NOTE — Progress Notes (Signed)
Pt more alert and oriented and answering questions appropriately.  Right radial shaved for cath procedure.  Will continue to monitor.  Fredrich Romans, RN

## 2015-10-28 NOTE — Progress Notes (Signed)
Eating [place] sandwich. Waiting on tele bed

## 2015-10-28 NOTE — Interval H&P Note (Signed)
Cath Lab Visit (complete for each Cath Lab visit)  Clinical Evaluation Leading to the Procedure:   ACS: No.  Non-ACS:    Anginal Classification: CCS III  Anti-ischemic medical therapy: Maximal Therapy (2 or more classes of medications)  Non-Invasive Test Results: No non-invasive testing performed  Prior CABG: No previous CABG      History and Physical Interval Note:  10/28/2015 10:16 AM  [name]  has presented today for surgery, with the diagnosis of cad, angina  The various methods of treatment have been discussed with the patient and family. After consideration of risks, benefits and other options for treatment, the patient has consented to  Procedure(s): Right/Left Heart Cath and Coronary Angiography (N/A) as a surgical intervention .  The patient's history has been reviewed, patient examined, no change in status, stable for surgery.  I have reviewed the patient's chart and labs.  Questions were answered to the patient's satisfaction.     Sherren Mocha

## 2015-10-28 NOTE — Progress Notes (Signed)
Pt with new onset confusion.  Pt unable to answer questions, unable to state name.  Pt follows commands and nods appropriately.  MD notified and neurology notified.  STAT CT head without contrast ordered.  Will continue to closely monitor.   Fredrich Romans, RN

## 2015-10-28 NOTE — H&P (View-Only) (Signed)
    SUBJECTIVE:  No chest pain.  No SOB.     PHYSICAL EXAM Filed Vitals:   10/26/15 1759 10/26/15 2102 10/27/15 0118 10/27/15 0628  BP: 143/83 127/74 145/88 167/90  Pulse: 74 77 80 84  Temp: 98.4 F (36.9 C) 97.9 F (36.6 C) 98 F (36.7 C) 98.2 F (36.8 C)  TempSrc: Oral Oral Oral Oral  Resp: 18 18 18 20   Height:      Weight:      SpO2: 94% 92% 92% 90%   General:  No distress Lungs:  Clear Heart:  Irregular Abdomen:  Positive bowel sounds, no rebound no guarding Extremities:  No edema   LABS: Lab Results  Component Value Date   TROPONINI <0.03 10/24/2015   Results for orders placed or performed during the hospital encounter of 10/24/15 (from the past 24 hour(s))  Glucose, capillary     Status: Abnormal   Collection Time: 10/26/15 11:29 AM  Result Value Ref Range   Glucose-Capillary 128 (H) 65 - 99 mg/dL  Glucose, capillary     Status: Abnormal   Collection Time: 10/26/15  4:52 PM  Result Value Ref Range   Glucose-Capillary 132 (H) 65 - 99 mg/dL   Comment 1 Notify RN    Comment 2 Document in Chart   Glucose, capillary     Status: None   Collection Time: 10/26/15  8:57 PM  Result Value Ref Range   Glucose-Capillary 95 65 - 99 mg/dL   Comment 1 Notify RN    Comment 2 Document in Chart   Heparin level (unfractionated)     Status: Abnormal   Collection Time: 10/27/15  5:40 AM  Result Value Ref Range   Heparin Unfractionated 0.20 (L) 0.30 - 0.70 IU/mL  CBC     Status: None   Collection Time: 10/27/15  5:40 AM  Result Value Ref Range   WBC 9.3 4.0 - 10.5 K/uL   RBC 4.87 4.22 - 5.81 MIL/uL   Hemoglobin 13.3 13.0 - 17.0 g/dL   HCT 40.9 39.0 - 52.0 %   MCV 84.0 78.0 - 100.0 fL   MCH 27.3 26.0 - 34.0 pg   MCHC 32.5 30.0 - 36.0 g/dL   RDW 14.0 11.5 - 15.5 %   Platelets 167 150 - 400 K/uL  Glucose, capillary     Status: Abnormal   Collection Time: 10/27/15  6:28 AM  Result Value Ref Range   Glucose-Capillary 126 (H) 65 - 99 mg/dL   Comment 1 Notify RN    Comment 2 Document in Chart    *Note: Due to a large number of results and/or encounters for the requested time period, some results have not been displayed. A complete set of results can be found in Results Review.   No intake or output data in the 24 hours ending 10/27/15 0940   ASSESSMENT AND PLAN:  CAD/Crescendo angina:  Cath planned for Wed.  Holding Pradaxa.  Bridge with heparin.    Permanent atrial fib:  As above.  Chronic systolic HF:     Seems to be euvolemic.  Continue current therapy.  I don't see a BMET.  I will order this.  I will hold his Lasix tonight and tomorrow AM .    HTN:  BP creeping up slightly.  Continue current meds and follow.        Jeneen Rinks [facility] 10/27/2015 9:40 AM

## 2015-10-28 NOTE — Op Note (Signed)
Procedure: LHC, coronary angio Findings: severe ostial LCx stenosis, patent LAD stent, patent dominant RCA supplies diagonal collateral.  Plan medical therapy. Resume pradaxa this evening. Anticipate DC tomorrow. Full report to follow.  Sherren Mocha 10/28/2015 1:25 PM

## 2015-10-28 NOTE — Progress Notes (Signed)
Ambulated to BR. Back to stretcher. Tolerated activity well.  Report called to [place] on 3W in stretcher.

## 2015-10-28 NOTE — Progress Notes (Signed)
[facility]                                                                              Patient Demographics  [name], is a 70 y.o. male, DOB - [birth year], JIR:[id number]  Admit date - 10/24/2015   Admitting Physician Andrew Mainland, DO  Outpatient Primary MD for the patient is Andrew Reddish, MD  LOS - 4   Chief Complaint  Patient presents with  . 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 His MRI, echogram and carotids were all nonacute. He was seen by neurology. Of note patient was already scheduled for outpatient left heart catheterization by Dr. Burt [name] for evaluation of his CAD symptoms on 10/28/2015. Cardiology was consulted. He has now been placed on heparin drip instead of Pradaxa in preparation of left heart catheterization on 10/28/2015. He is currently completely symptom free.    Assessment & Plan    Resolved dysarthria. Syncope versus TIA - Could have been due to TIA. Currently on Pradaxa/Hep gtt, history of mural thrombus, also on Plavix and statin for secondary prevention.  - MRI showed no acute infarct, MRA showed high-grade right PCA stenosis - 2-D echo showed no source of embolus, LDL 16 -  Seen by PT-OT and speech as well. PT recommended no PT follow-up - Carotid ultrasound showed less than 50% stenosis in the right and left internal carotid arteries  Hx of CAD. chest pain free, on Coreg, Plavix, Pradaxa (now on Hep gtt) , statin, Imdur for secondary prevention.  - cardiology consulted, pradaxa on hold, on heparin drip - Patient underwent cardiac catheterization showed  severe ostial LCx stenosis, patent LAD stent, patent dominant RCA supplies diagonal collateral. - Plan medical therapy. Resume pradaxa this evening.   History of chronic atrial fibrillation with known intramural thrombus. Continue Coreg and Pradaxa for now.   OSA. CPAP at night.  Essential hypertension. Currently on Coreg, Lasix, hydralazine, ARB, Imdur which will be continued at home dose.   Mild acute on Chronic diastolic and systolic heart failure EF 35%. Currently compensated, continue ARB, Aldactone, Coreg combination.   Hypothyroidism. Continue home dose Synthroid.    DM type II. A1C mildly elevated, he is currently on sliding scale insulin along with glipizide and Linaglipitin.  Code Status: full code   Family Communication: Discussed in detail with the patient, all imaging results, lab results explained to the patient And wife at the bedside   Disposition Plan: DC home in AM per cards recommendations   Time Spent in minutes   25 minutes  Procedures  Cardiac cath  [facility]   Neurology cardiology  DVT Prophylaxis  dabigatran  Medications  Scheduled Meds: . atorvastatin  20 mg Oral Daily  . carvedilol  25 mg Oral BID WC  . clopidogrel  75 mg Oral Daily  . dabigatran  150 mg Oral Q12H  . glipiZIDE  10 mg Oral BID AC  . hydrALAZINE  25 mg Oral Once  . insulin aspart  0-15 Units Subcutaneous TID WC  . insulin aspart  0-5 Units Subcutaneous QHS  . irbesartan  300 mg Oral Daily  . isosorbide mononitrate  30 mg Oral Daily  . levothyroxine  50 mcg Oral QAC breakfast  . linagliptin  5 mg Oral Daily  . mesalamine  1.5 g Oral Daily  . potassium chloride SA  20 mEq Oral Daily  . sodium chloride  3 mL Intravenous Q12H  . spironolactone  12.5 mg Oral Daily   Continuous Infusions: . sodium chloride 50 mL/hr at 10/28/15 1145   PRN Meds:.sodium chloride, acetaminophen, hydrALAZINE, HYDROcodone-acetaminophen, ondansetron (ZOFRAN) IV, senna-docusate, sodium  chloride   Antibiotics   Anti-infectives    None        Subjective:   [name] was seen and examined today. Overnight some confusion. CT hyaed negative, NH3 normal. Now alert and oriented 3. Wife at the bedside. Patient denies dizziness, chest pain, shortness of breath, abdominal pain, N/V/D/C, new weakness, numbess, tingling.   Objective:   Blood pressure 136/92, pulse 78, temperature 98.2 F (36.8 C), temperature source Oral, resp. rate 14, height 5' 8"  (1.727 m), weight 131.861 kg (290 lb 11.2 oz), SpO2 93 %.  Wt Readings from Last 3 Encounters:  10/27/15 131.861 kg (290 lb 11.2 oz)  10/21/15 133.085 kg (293 lb 6.4 oz)  09/07/15 137.712 kg (303 lb 9.6 oz)    No intake or output data in the 24 hours ending 10/28/15 1404  Exam  General: Alert and oriented x 3, NAD  HEENT:  PERRLA, EOMI, Anicteric Sclera, mucous membranes moist.   Neck: Supple, no JVD, no masses  CVS: S1 S2 auscultated, no rubs, murmurs or gallops. Regular rate and rhythm.  Respiratory: Clear to auscultation bilaterally, no wheezing, rales or rhonchi  Abdomen: Soft, nontender, nondistended, + bowel sounds  Ext: no cyanosis clubbing or edema  Neuro: AAOx3, Cr N's II- XII. Strength 5/5 upper and lower extremities bilaterally  Skin: No rashes  Psych: Normal affect and demeanor, alert and oriented x3    Data Review   Micro Results No results found for this or any previous visit (from the past 240 hour(s)).  Radiology Reports Ct Head Wo Contrast  10/28/2015  CLINICAL DATA:  Acute onset of altered mental status. Initial encounter. EXAM: CT HEAD WITHOUT CONTRAST TECHNIQUE: Contiguous axial images were obtained from the base of the skull through the vertex without intravenous contrast. COMPARISON:  MRI of the brain performed 10/26/2015, and CT of the head performed 10/24/2015 FINDINGS: There is no evidence of acute infarction, mass lesion, or intra- or extra-axial hemorrhage on CT. Prominence  of the ventricles and sulci reflects mild to moderate cortical volume loss. Mild cerebellar atrophy is noted. A chronic infarct is noted at the right basal ganglia and anterior corona radiata. Periventricular white matter change likely reflects small vessel ischemic microangiopathy. The brainstem and fourth ventricle are within normal limits. No mass effect or midline shift is seen. There is no evidence of fracture; visualized osseous structures are unremarkable in appearance. The orbits are within normal limits. The paranasal sinuses and mastoid air cells are well-aerated. No significant soft tissue abnormalities are seen. IMPRESSION: 1. No acute intracranial pathology seen on CT. 2. Mild to moderate cortical volume loss and scattered small vessel ischemic microangiopathy. 3. 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 Electronically Signed   By: Garald Balding M.D.   On: 10/28/2015 02:24   Ct Head Wo Contrast  10/24/2015  CLINICAL DATA:  Acute onset of confusion and slurred speech. Left-sided headache. Initial encounter. EXAM: CT HEAD WITHOUT CONTRAST TECHNIQUE: Contiguous axial images were obtained from the base of the skull through the vertex without intravenous contrast. COMPARISON:  None. FINDINGS: There is no evidence of acute infarction, mass lesion, or intra- or extra-axial hemorrhage on CT. Prominence of the ventricles and sulci reflects mild to moderate cortical volume loss. Scattered periventricular and subcortical white matter change likely reflects small vessel ischemic microangiopathy. Small chronic lacunar infarcts are noted at the basal ganglia bilaterally. Cerebellar atrophy is noted. The brainstem and fourth ventricle are within normal limits. The cerebral hemispheres demonstrate grossly normal gray-white differentiation. No mass effect or midline shift is seen. There is no evidence of fracture; visualized osseous structures are unremarkable in appearance. The  visualized portions of the orbits are within normal limits. The paranasal sinuses and mastoid air cells are well-aerated. No significant soft tissue abnormalities are seen. IMPRESSION: 1. No acute intracranial pathology seen on CT. 2. Mild to moderate cortical volume loss and scattered small vessel ischemic microangiopathy. 3. Small chronic lacunar infarcts at the basal ganglia bilaterally. Electronically Signed   By: Garald Balding M.D.   On: 10/24/2015 19:35   Mr Brain Wo Contrast  10/26/2015  CLINICAL DATA:  70 year old diabetic hypertensive male with transient dysarthria and confusion. Subsequent encounter. EXAM: MRI HEAD WITHOUT CONTRAST MRA HEAD WITHOUT CONTRAST TECHNIQUE: Multiplanar, multiecho pulse sequences of the brain and surrounding structures were obtained without intravenous contrast. Angiographic images of the head were obtained using MRA technique without contrast. COMPARISON:  10/24/2015 CT.  No comparison MR. FINDINGS: MRI HEAD FINDINGS Exam is motion degraded. No acute infarct or intracranial hemorrhage. Remote infarct superior right lenticular nucleus/ corona radiata. Remote small right cerebellar infarct. Moderate chronic small vessel disease changes most notable periventricular region and right frontal lobe. Global atrophy without hydrocephalus. No intracranial mass lesion noted on this unenhanced exam. Prominent exophthalmos. Prominent retrobulbar fat. Symmetric extra-ocular muscles. Minimal to mild paranasal sinus mucosal thickening. Cervical spondylotic changes with C3-4 bulge/ protrusion, spinal stenosis and mild cord flattening. Cervical spine MR can be obtained further delineation if clinically desired. Cervical medullary junction, pituitary region and pineal region unremarkable. MRA HEAD FINDINGS Anterior circulation without medium or large size vessel significant stenosis or occlusion. Right anterior cerebral artery smaller than the left. Fetal type contribution to the left  posterior cerebral artery. Right vertebral artery proximal to the right posterior inferior cerebellar artery is not visualized. Narrowed irregular small right vertebral artery between the right posterior inferior cerebellar artery and formation of the basilar artery. Ectatic left vertebral artery without significant narrowing. Ectatic basilar artery with mild irregularity minimal narrowing. High-grade focal stenosis right posterior cerebral artery P1 -P2 junction. Mild narrowing proximal left posterior cerebral artery (proximally as well as involving distal branches). No aneurysm noted. IMPRESSION: MRI HEAD Exam is motion degraded. No acute infarct or intracranial hemorrhage. Remote infarct superior right lenticular nucleus/ corona radiata. Remote small right cerebellar infarct. Moderate chronic small vessel disease changes most notable periventricular region and right frontal lobe. Global atrophy. Prominent exophthalmos.  Prominent retrobulbar fat. Minimal to mild paranasal sinus mucosal thickening. Cervical spondylotic changes with C3-4 bulge/ protrusion, spinal stenosis and mild cord flattening. Cervical spine MR can be obtained further delineation if clinically desired. MRA HEAD  Right vertebral artery proximal to the right posterior inferior cerebellar artery is not visualized. Narrowed irregular small right vertebral artery between the right posterior inferior cerebellar artery and formation of the basilar artery. Ectatic left vertebral artery without significant narrowing. Ectatic basilar artery with mild irregularity and minimal narrowing. High-grade focal stenosis right posterior cerebral artery P1 -P2 junction. Mild narrowing proximal left posterior cerebral artery (proximally as well as involving distal branches). Anterior circulation without medium or large size vessel significant stenosis or occlusion. Right anterior cerebral artery smaller than the left. Electronically Signed   By: Genia Del M.D.    On: 10/26/2015 08:32   US Carotid Bilateral  10/25/2015  CLINICAL DATA:  Slurred speech EXAM: BILATERAL CAROTID DUPLEX ULTRASOUND TECHNIQUE: Pearline Cables scale imaging, color Doppler and duplex ultrasound were performed of bilateral carotid and vertebral arteries in the neck. COMPARISON:  None. FINDINGS: Criteria: Quantification of carotid stenosis is based on velocity parameters that correlate the residual internal carotid diameter with NASCET-based stenosis levels, using the diameter of the distal internal carotid lumen as the denominator for stenosis measurement. The following velocity measurements were obtained: RIGHT ICA:  48 cm/sec CCA:  78 cm/sec SYSTOLIC ICA/CCA RATIO:  0.6 DIASTOLIC ICA/CCA RATIO:  0.6 ECA:  61 cm/sec LEFT ICA:  35 cm/sec CCA:  59 cm/sec SYSTOLIC ICA/CCA RATIO:  0.6 DIASTOLIC ICA/CCA RATIO:  1.1 ECA:  74 cm/sec RIGHT CAROTID ARTERY: Mild calcified plaque in the bulb. Low resistance internal carotid Doppler pattern. RIGHT VERTEBRAL ARTERY:  Antegrade.  Low resistance Doppler pattern. LEFT CAROTID ARTERY: Minimal plaque in the bulb. Low resistance internal carotid Doppler pattern. LEFT VERTEBRAL ARTERY:  Antegrade.  Low resistance Doppler pattern. IMPRESSION: Less than 50% stenosis in the right and left internal carotid arteries. Electronically Signed   By: Marybelle Killings M.D.   On: 10/25/2015 14:01   Dg Chest Portable 1 View  10/25/2015  CLINICAL DATA:  Shortness of breath, history coronary artery disease post MI and coronary stenting, type II diabetes mellitus, hypertension, ischemic cardiomyopathy, atrial fibrillation EXAM: PORTABLE CHEST 1 VIEW COMPARISON:  Portable exam 2340 hours compared to 01/04/2011 FINDINGS: Enlargement of cardiac silhouette with pulmonary vascular congestion. Azygos fissure noted. Mild perihilar infiltrates likely pulmonary edema and CHF. No gross pleural effusion or pneumothorax. Examination limited secondary to body habitus. IMPRESSION: Enlargement of cardiac  silhouette with vascular congestion and pulmonary infiltrates most likely representing pulmonary edema/CHF. Electronically Signed   By: Lavonia Dana M.D.   On: 10/25/2015 00:05   Mr Jodene Nam Head/brain Wo Cm  10/26/2015  CLINICAL DATA:  70 year old diabetic hypertensive male with transient dysarthria and confusion. Subsequent encounter. EXAM: MRI HEAD WITHOUT CONTRAST MRA HEAD WITHOUT CONTRAST TECHNIQUE: Multiplanar, multiecho pulse sequences of the brain and surrounding structures were obtained without intravenous contrast. Angiographic images of the head were obtained using MRA technique without contrast. COMPARISON:  10/24/2015 CT.  No comparison MR. FINDINGS: MRI HEAD FINDINGS Exam is motion degraded. No acute infarct or intracranial hemorrhage. Remote infarct superior right lenticular nucleus/ corona radiata. Remote small right cerebellar infarct. Moderate chronic small vessel disease changes most notable periventricular region and right frontal lobe. Global atrophy without hydrocephalus. No intracranial mass lesion noted on this unenhanced exam. Prominent exophthalmos. Prominent retrobulbar fat. Symmetric extra-ocular muscles. Minimal to mild paranasal sinus mucosal thickening. Cervical spondylotic changes with C3-4 bulge/ protrusion, spinal stenosis and mild cord flattening. Cervical spine MR can be obtained further delineation if clinically desired. Cervical medullary junction, pituitary region and pineal region unremarkable. MRA HEAD FINDINGS Anterior  circulation without medium or large size vessel significant stenosis or occlusion. Right anterior cerebral artery smaller than the left. Fetal type contribution to the left posterior cerebral artery. Right vertebral artery proximal to the right posterior inferior cerebellar artery is not visualized. Narrowed irregular small right vertebral artery between the right posterior inferior cerebellar artery and formation of the basilar artery. Ectatic left vertebral  artery without significant narrowing. Ectatic basilar artery with mild irregularity minimal narrowing. High-grade focal stenosis right posterior cerebral artery P1 -P2 junction. Mild narrowing proximal left posterior cerebral artery (proximally as well as involving distal branches). No aneurysm noted. IMPRESSION: MRI HEAD Exam is motion degraded. No acute infarct or intracranial hemorrhage. Remote infarct superior right lenticular nucleus/ corona radiata. Remote small right cerebellar infarct. Moderate chronic small vessel disease changes most notable periventricular region and right frontal lobe. Global atrophy. Prominent exophthalmos.  Prominent retrobulbar fat. Minimal to mild paranasal sinus mucosal thickening. Cervical spondylotic changes with C3-4 bulge/ protrusion, spinal stenosis and mild cord flattening. Cervical spine MR can be obtained further delineation if clinically desired. MRA HEAD Right vertebral artery proximal to the right posterior inferior cerebellar artery is not visualized. Narrowed irregular small right vertebral artery between the right posterior inferior cerebellar artery and formation of the basilar artery. Ectatic left vertebral artery without significant narrowing. Ectatic basilar artery with mild irregularity and minimal narrowing. High-grade focal stenosis right posterior cerebral artery P1 -P2 junction. Mild narrowing proximal left posterior cerebral artery (proximally as well as involving distal branches). Anterior circulation without medium or large size vessel significant stenosis or occlusion. Right anterior cerebral artery smaller than the left. Electronically Signed   By: Genia Del M.D.   On: 10/26/2015 08:32    CBC  Recent Labs Lab 10/21/15 1701 10/24/15 1917 10/24/15 1943 10/27/15 0540 10/28/15 0613  WBC 10.6* 8.7  --  9.3 8.2  HGB 13.8 13.0 13.9 13.3 13.0  HCT 42.3 40.1 41.0 40.9 40.5  PLT 183 155  --  167 156  MCV 81.5 83.9  --  84.0 83.9  MCH 26.6 27.2   --  27.3 26.9  MCHC 32.6 32.4  --  32.5 32.1  RDW 15.4 14.1  --  14.0 14.1  LYMPHSABS  --  0.8  --   --   --   MONOABS  --  0.7  --   --   --   EOSABS  --  0.1  --   --   --   BASOSABS  --  0.0  --   --   --     Chemistries   Recent Labs Lab 10/21/15 1701 10/24/15 1917 10/24/15 1943 10/27/15 1140 10/28/15 0613  NA 139 141 141 137 139  K 4.3 4.0 3.9 3.9 3.8  CL 103 105 102 98* 102  CO2 27 27  --  29 29  GLUCOSE 93 208* 207* 174* 153*  BUN 13 15 14 15 14   CREATININE 1.03 0.88 0.80 1.04 1.02  CALCIUM 9.0 8.7*  --  8.6* 8.6*  AST  --  15  --   --   --   ALT  --  11*  --   --   --   ALKPHOS  --  75  --   --   --   BILITOT  --  0.6  --   --   --    ------------------------------------------------------------------------------------------------------------------ estimated creatinine clearance is 90.7 mL/min (by C-G formula based on Cr of 1.02). ------------------------------------------------------------------------------------------------------------------ No results for input(s):  HGBA1C in the last 72 hours. ------------------------------------------------------------------------------------------------------------------ No results for input(s): CHOL, HDL, LDLCALC, TRIG, CHOLHDL, LDLDIRECT in the last 72 hours. ------------------------------------------------------------------------------------------------------------------ No results for input(s): TSH, T4TOTAL, T3FREE, THYROIDAB in the last 72 hours.  Invalid input(s): FREET3 ------------------------------------------------------------------------------------------------------------------ No results for input(s): VITAMINB12, FOLATE, FERRITIN, TIBC, IRON, RETICCTPCT in the last 72 hours.  Coagulation profile  Recent Labs Lab 10/21/15 1701 10/24/15 1917 10/28/15 0613  INR 1.57* 1.96* 1.40    No results for input(s): DDIMER in the last 72 hours.  Cardiac Enzymes  Recent Labs Lab 10/24/15 1917  TROPONINI <0.03    ------------------------------------------------------------------------------------------------------------------ Invalid input(s): POCBNP   Recent Labs  10/27/15 1144 10/27/15 1649 10/27/15 2247 10/28/15 0150 10/28/15 0628 10/28/15 1134  GLUCAP 163* 100* 139* 151* 143* 118*     RAI,RIPUDEEP M.D. [facility] 10/28/2015, 2:04 PM  Pager: [phone] Between 7am to 7pm - call Pager - [phone]  After 7pm go to [email or web address] - password [email or web address]  Call night coverage person covering after 7pm

## 2015-10-28 NOTE — Progress Notes (Signed)
Shift event: Earlier in shift, RN paged over concerns for pt being confused. Apparently, he has been alert and oriented previously. Pt would not tell the RN his name and was speaking in one word answers. No other neuro deficit noted.  Given his dx of TIA, pt sent for r/p stat CT head which was negative. CT head/MRI/MRA brain have all been normal since admission. Neuro signed off. Pt takes Plavix and ASA for heart issues.  Later, confusion waxing and waning, so NP saw pt.  He is sleeping but easily arousable and is alert. He is answering questions appropriately now and is oriented. Neuro exam performed and is without deficit. Update wife and pt.  Pt due for labs this am and will add ammonia as well.  KJKG, NP [facility]

## 2015-10-29 ENCOUNTER — Telehealth: Payer: Self-pay | Admitting: Cardiovascular Disease

## 2015-10-29 ENCOUNTER — Encounter (HOSPITAL_COMMUNITY): Payer: Self-pay | Admitting: Cardiovascular Disease

## 2015-10-29 LAB — GLUCOSE, CAPILLARY
GLUCOSE-CAPILLARY: 150 mg/dL — AB (ref 65–99)
GLUCOSE-CAPILLARY: 175 mg/dL — AB (ref 65–99)

## 2015-10-29 LAB — CBC
HEMATOCRIT: 40.1 % (ref 39.0–52.0)
Hemoglobin: 13 g/dL (ref 13.0–17.0)
MCH: 27.1 pg (ref 26.0–34.0)
MCHC: 32.4 g/dL (ref 30.0–36.0)
MCV: 83.5 fL (ref 78.0–100.0)
Platelets: 148 10*3/uL — ABNORMAL LOW (ref 150–400)
RBC: 4.8 MIL/uL (ref 4.22–5.81)
RDW: 14.2 % (ref 11.5–15.5)
WBC: 7.6 10*3/uL (ref 4.0–10.5)

## 2015-10-29 MED ORDER — ISOSORBIDE MONONITRATE ER 60 MG PO TB24: 60.0000 mg | ORAL_TABLET | Freq: Every day | ORAL | Status: DC

## 2015-10-29 MED ORDER — ISOSORBIDE MONONITRATE ER 60 MG PO TB24
60.0000 mg | ORAL_TABLET | Freq: Every day | ORAL | Status: DC
  Administered 2015-10-29: 60 mg via ORAL
  Filled 2015-10-29: qty 1

## 2015-10-29 NOTE — Care Management Note (Signed)
Case Management Note  Patient Details  Name: [name] MRN: [id number] Date of Birth: 23-Feb-1946  Subjective/Objective:     Pt admitted for stroke like symptoms. Pt plan for d/c today- pt was previously on Pradaxa before hospitalization.             Action/Plan: No needs from CM at this time.    Expected Discharge Date:                  Expected Discharge Plan:  Home/Self Care  In-House Referral:  NA  Discharge planning Services  CM Consult  Post Acute Care Choice:  NA Choice offered to:  NA  DME Arranged:  N/A DME Agency:  NA  HH Arranged:  NA HH Agency:  NA  Status of Service:  Completed, signed off  Medicare Important Message Given:  Yes Date Medicare IM Given:    Medicare IM give by:    Date Additional Medicare IM Given:    Additional Medicare Important Message give by:     If discussed at [place] of Stay Meetings, dates discussed:    Additional Comments:  Bethena Roys, RN 10/29/2015, 10:48 AM

## 2015-10-29 NOTE — Telephone Encounter (Signed)
meds reviewed. Favor restarting lasix 40 mg daily. Should have bmet checked at time of follow-up. thx

## 2015-10-29 NOTE — Progress Notes (Signed)
    SUBJECTIVE:  No chest pain.  No SOB.     PHYSICAL EXAM Filed Vitals:   10/28/15 1830 10/28/15 2130 10/28/15 2203 10/29/15 0400  BP:  131/87  182/100  Pulse:  60 62 78  Temp:  98.5 F (36.9 C)  97.6 F (36.4 C)  TempSrc:  Oral  Oral  Resp:  16 18 20   Height:      Weight:    284 lb (128.822 kg)  SpO2: 96% 93% 94% 95%   General:  No distress Lungs:  Clear Heart:  Irregular Abdomen:  Positive bowel sounds, no rebound no guarding Extremities:  No edema , right wrist without bleeding or erythema.    LABS:  Results for orders placed or performed during the hospital encounter of 10/24/15 (from the past 24 hour(s))  Glucose, capillary     Status: Abnormal   Collection Time: 10/28/15 11:34 AM  Result Value Ref Range   Glucose-Capillary 118 (H) 65 - 99 mg/dL  Glucose, capillary     Status: Abnormal   Collection Time: 10/28/15  4:36 PM  Result Value Ref Range   Glucose-Capillary 179 (H) 65 - 99 mg/dL   Comment 1 Notify RN    Comment 2 Document in Chart   Glucose, capillary     Status: Abnormal   Collection Time: 10/28/15  9:29 PM  Result Value Ref Range   Glucose-Capillary 144 (H) 65 - 99 mg/dL   Comment 1 Notify RN   CBC     Status: Abnormal   Collection Time: 10/29/15  3:48 AM  Result Value Ref Range   WBC 7.6 4.0 - 10.5 K/uL   RBC 4.80 4.22 - 5.81 MIL/uL   Hemoglobin 13.0 13.0 - 17.0 g/dL   HCT 40.1 39.0 - 52.0 %   MCV 83.5 78.0 - 100.0 fL   MCH 27.1 26.0 - 34.0 pg   MCHC 32.4 30.0 - 36.0 g/dL   RDW 14.2 11.5 - 15.5 %   Platelets 148 (L) 150 - 400 K/uL  Glucose, capillary     Status: Abnormal   Collection Time: 10/29/15  8:04 AM  Result Value Ref Range   Glucose-Capillary 150 (H) 65 - 99 mg/dL   Comment 1 Notify RN    Comment 2 Document in Chart    *Note: Due to a large number of results and/or encounters for the requested time period, some results have not been displayed. A complete set of results can be found in Results Review.    Intake/Output Summary  (Last 24 hours) at 10/29/15 0834 Last data filed at 10/28/15 1700  Gross per 24 hour  Intake  452.5 ml  Output      0 ml  Net  452.5 ml     ASSESSMENT AND PLAN:  CAD/C[place] angina:  Severe ostial circumflex stenosis.  Medical management    Permanent atrial fib:   On Pradaxa.  Restarted last night.  No other change in therapy.   Chronic systolic HF:     Seems to be euvolemic.  Send home on previous meds including previous diuretic dose.    HTN:  BP has been fluctuating quite a bit.  Needs to keep a BP diary at home and review at follow up appt.       Jeneen Rinks [facility] 10/29/2015 8:34 AM

## 2015-10-29 NOTE — Telephone Encounter (Signed)
I spoke with the pt's grand daughter and she is going over the pt's medications from hospital discharge and she noticed that Furosemide is not on the list. The pt was taking Furosemide 25m daily prior to hospitalization.  I will forward this information to Dr CBurt Knackto give orders for Furosemide.

## 2015-10-29 NOTE — Discharge Summary (Signed)
Physician Discharge Summary   Patient ID: [name] MRN: [id number] DOB/AGE: August 24, 1946 70 y.o.  Admit date: 10/24/2015 Discharge date: 10/29/2015  Primary Care Physician:  Garret Reddish, MD  Discharge Diagnoses:    . TIA (transient ischemic attack) . Type II diabetes mellitus with peripheral circulatory disorder (HCC) . Essential hypertension . Atrial fibrillation ([place]) . OSA (obstructive sleep apnea) . Acute on chronic systolic CHF (congestive heart failure) ([place]) . Acute respiratory failure with hypoxia ([place]) . Hyperlipidemia  Consults: Cardiology, [name] Neurology, Dr. Leonie Man  Recommendations for Outpatient Follow-up:  1. Please repeat CBC/BMET at next visit 2. Imdur increased to 60 mg daily   DIET: Heart healthy diet    Allergies:  No Known Allergies   DISCHARGE MEDICATIONS: Current Discharge Medication List    CONTINUE these medications which have CHANGED   Details  isosorbide mononitrate (IMDUR) 60 MG 24 hr tablet Take 1 tablet (60 mg total) by mouth daily. Qty: 30 tablet, Refills: 3      CONTINUE these medications which have NOT CHANGED   Details  atorvastatin (LIPITOR) 20 MG tablet Take 1 tablet (20 mg total) by mouth daily. Qty: 90 tablet, Refills: 3   Associated Diagnoses: Hyperlipidemia    carvedilol (COREG) 25 MG tablet TAKE ONE TABLET BY MOUTH TWICE A DAY WITH A MEAL Qty: 60 tablet, Refills: 10    clopidogrel (PLAVIX) 75 MG tablet Take 75 mg by mouth daily.    fish oil-omega-3 fatty acids 1000 MG capsule Take 2 g by mouth daily.     glipiZIDE (GLUCOTROL) 10 MG tablet Take 10 mg by mouth 2 (two) times daily before a meal.    Glucosamine-Chondroitin (OSTEO BI-FLEX REGULAR STRENGTH) 250-200 MG TABS Take 1 tablet by mouth 2 (two) times daily.     levothyroxine (SYNTHROID, LEVOTHROID) 50 MCG tablet Take 50 mcg by mouth daily before breakfast.    mesalamine (APRISO) 0.375 g 24 hr capsule Take 1.5 mg by mouth daily.    metFORMIN  (GLUCOPHAGE) 1000 MG tablet Take 1,000 mg by mouth 2 (two) times daily with a meal.    olmesartan (BENICAR) 40 MG tablet Take 40 mg by mouth daily.    potassium chloride SA (K-DUR,KLOR-CON) 20 MEQ tablet Take 20 mEq by mouth daily.    PRADAXA 150 MG CAPS capsule TAKE ONE TABLET BY MOUTH TWICE A DAY Qty: 60 capsule, Refills: 9    sitaGLIPtin (JANUVIA) 100 MG tablet Take 100 mg by mouth daily.    triamcinolone cream (KENALOG) 0.1 % Apply 1 application topically 2 (two) times daily. Qty: 30 g, Refills: 0    diclofenac sodium (VOLTAREN) 1 % GEL Apply 2 g topically 4 (four) times daily as needed (knee pain).    spironolactone (ALDACTONE) 25 MG tablet Take 0.5 tablets (12.5 mg total) by mouth daily. 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 His MRI, echogram and carotids were all nonacute. He was seen by  neurology. Of note patient was already scheduled for outpatient left heart catheterization by [name] for evaluation of his CAD symptoms on 10/28/2015. Cardiology was consulted. He has now been placed on heparin drip instead of Pradaxa in preparation of left heart catheterization on 10/28/2015. He is currently completely symptom free.  Hospital Course:  Resolved dysarthria. Syncope versus TIA - Could have been due to TIA. Patient was on Pradaxa prior to admission due to atrial fibrillation and history of mural thrombus, also on Plavix and statin for secondary  prevention. Pradaxa was placed on hold and patient with heparin drip for cardiac catheterization. - MRI showed no acute infarct, MRA showed high-grade right PCA stenosis - 2-D echo showed no source of embolus, LDL 16 - Seen by PT-OT and speech as well. PT recommended no PT follow-up - Carotid ultrasound showed less than 50% stenosis in the right and left internal carotid arteries Patient will resume Plavix and Pradaxa per his home regimen. Aspirin was discontinued.  Hx of CAD. chest pain free, on Coreg, Plavix, Pradaxa  statin, Imdur for secondary prevention.  - cardiology was consulted, pradaxa was placed on hold and patient was place with heparin drip - Patient underwent cardiac catheterization showed severe ostial LCx stenosis, patent LAD stent, patent dominant RCA supplies diagonal collateral. - Cardiology recommended continue medical therapy and resume pradaxa  History of chronic atrial fibrillation with known intramural thrombus. Continue Coreg and Pradaxa for now.  OSA. CPAP at night.  Essential hypertension.  - Currently on Coreg, Lasix, hydralazine, ARB, Imdur . Imdur was increased to 60 mg daily.  Mild acute on Chronic diastolic and systolic heart failure EF 35%. Currently compensated, continue ARB, Aldactone, Coreg combination.  Hypothyroidism. Continue home dose Synthroid.   DM type II. A1C mildly elevated, continue glipizide and Linaglipitin.    Day of Discharge BP 182/100 mmHg  Pulse 78  Temp(Src) 97.6 F (36.4 C) (Oral)  Resp 20  Ht 5' 8"  (1.727 m)  Wt 128.822 kg (284 lb)  BMI 43.19 kg/m2  SpO2 95%  Physical Exam: General: Alert and awake oriented x3 not in any acute distress. HEENT: anicteric sclera, pupils reactive to light and accommodation CVS: S1-S2 clear no murmur rubs or gallops Chest: clear to auscultation bilaterally, no wheezing rales or rhonchi Abdomen: soft nontender, nondistended, normal bowel sounds Extremities: no cyanosis, clubbing  or edema noted bilaterally Neuro: Cranial nerves II-XII intact, no focal neurological deficits   The results of significant diagnostics from this hospitalization (including imaging, microbiology, ancillary and laboratory) are listed below for reference.    LAB RESULTS: Basic Metabolic Panel:  Recent Labs Lab 10/27/15 1140 10/28/15 0613  NA 137 139  K 3.9 3.8  CL 98* 102  CO2 29 29  GLUCOSE 174* 153*  BUN 15 14  CREATININE 1.04 1.02  CALCIUM 8.6* 8.6*   Liver Function Tests:  Recent Labs Lab 10/24/15 1917  AST 15  ALT 11*  ALKPHOS 75  BILITOT 0.6  PROT 6.5  ALBUMIN 4.0   No results for input(s): LIPASE, AMYLASE in the last 168 hours.  Recent Labs Lab 10/28/15 0613  AMMONIA 30   CBC:  Recent Labs Lab 10/24/15 1917  10/28/15 0613 10/29/15 0348  WBC 8.7  < > 8.2 7.6  NEUTROABS 7.0  --   --   --   HGB 13.0  < > 13.0 13.0  HCT 40.1  < > 40.5 40.1  MCV 83.9  < > 83.9 83.5  PLT 155  < >  156 148*  < > = values in this interval not displayed. Cardiac Enzymes:  Recent Labs Lab 10/24/15 1917  TROPONINI <0.03   BNP: Invalid input(s): POCBNP CBG:  Recent Labs Lab 10/28/15 2129 10/29/15 0804  GLUCAP 144* 150*    Significant Diagnostic Studies:  Ct Head Wo Contrast  10/24/2015  CLINICAL DATA:  Acute onset of confusion and slurred speech. Left-sided headache. Initial encounter. EXAM: CT HEAD WITHOUT CONTRAST TECHNIQUE: Contiguous axial images were obtained from the base of the skull through the vertex without intravenous contrast. COMPARISON:  None. FINDINGS: There is no evidence of acute infarction, mass lesion, or intra- or extra-axial hemorrhage on CT. Prominence of the ventricles and sulci reflects mild to moderate cortical volume loss. Scattered periventricular and subcortical white matter change likely reflects small vessel ischemic microangiopathy. Small chronic lacunar infarcts are noted at the basal ganglia bilaterally. Cerebellar atrophy is noted.  The brainstem and fourth ventricle are within normal limits. The cerebral hemispheres demonstrate grossly normal gray-white differentiation. No mass effect or midline shift is seen. There is no evidence of fracture; visualized osseous structures are unremarkable in appearance. The visualized portions of the orbits are within normal limits. The paranasal sinuses and mastoid air cells are well-aerated. No significant soft tissue abnormalities are seen. IMPRESSION: 1. No acute intracranial pathology seen on CT. 2. Mild to moderate cortical volume loss and scattered small vessel ischemic microangiopathy. 3. Small chronic lacunar infarcts at the basal ganglia bilaterally. Electronically Signed   By: Garald Balding M.D.   On: 10/24/2015 19:35   US Carotid Bilateral  10/25/2015  CLINICAL DATA:  Slurred speech EXAM: BILATERAL CAROTID DUPLEX ULTRASOUND TECHNIQUE: Pearline Cables scale imaging, color Doppler and duplex ultrasound were performed of bilateral carotid and vertebral arteries in the neck. COMPARISON:  None. FINDINGS: Criteria: Quantification of carotid stenosis is based on velocity parameters that correlate the residual internal carotid diameter with NASCET-based stenosis levels, using the diameter of the distal internal carotid lumen as the denominator for stenosis measurement. The following velocity measurements were obtained: RIGHT ICA:  48 cm/sec CCA:  78 cm/sec SYSTOLIC ICA/CCA RATIO:  0.6 DIASTOLIC ICA/CCA RATIO:  0.6 ECA:  61 cm/sec LEFT ICA:  35 cm/sec CCA:  59 cm/sec SYSTOLIC ICA/CCA RATIO:  0.6 DIASTOLIC ICA/CCA RATIO:  1.1 ECA:  74 cm/sec RIGHT CAROTID ARTERY: Mild calcified plaque in the bulb. Low resistance internal carotid Doppler pattern. RIGHT VERTEBRAL ARTERY:  Antegrade.  Low resistance Doppler pattern. LEFT CAROTID ARTERY: Minimal plaque in the bulb. Low resistance internal carotid Doppler pattern. LEFT VERTEBRAL ARTERY:  Antegrade.  Low resistance Doppler pattern. IMPRESSION: Less than 50% stenosis in  the right and left internal carotid arteries. Electronically Signed   By: Marybelle Killings M.D.   On: 10/25/2015 14:01   Dg Chest Portable 1 View  10/25/2015  CLINICAL DATA:  Shortness of breath, history coronary artery disease post MI and coronary stenting, type II diabetes mellitus, hypertension, ischemic cardiomyopathy, atrial fibrillation EXAM: PORTABLE CHEST 1 VIEW COMPARISON:  Portable exam 2340 hours compared to 01/04/2011 FINDINGS: Enlargement of cardiac silhouette with pulmonary vascular congestion. Azygos fissure noted. Mild perihilar infiltrates likely pulmonary edema and CHF. No gross pleural effusion or pneumothorax. Examination limited secondary to body habitus. IMPRESSION: Enlargement of cardiac silhouette with vascular congestion and pulmonary infiltrates most likely representing pulmonary edema/CHF. Electronically Signed   By: Lavonia Dana M.D.   On: 10/25/2015 00:05    2D ECHO:   Disposition and Follow-up: Discharge Instructions    Ambulatory referral to  Neurology    Complete by:  As directed   Please schedule post stroke follow up in 2 months.     Diet Carb Modified    Complete by:  As directed      Increase activity slowly    Complete by:  As directed             DISPOSITION: Home   DISCHARGE FOLLOW-UP Follow-up Information    Follow up with SETHI,PRAMOD, MD In 2 months.   Specialties:  Neurology, Radiology   Why:  Stroke Clinic, Office will call you with appointment date & time   Contact information:   [place] [address] [phone]       Follow up with Garret Reddish, MD. Go on 11/10/2015.   Specialty:  Family Medicine   Why:  for hospital follow-up @ 3pm   Contact information:   [place] [address] [phone]       Follow up with Sherren Mocha, MD. Go on 11/13/2015.   Specialty:  Cardiology   Why:  for hospital follow-up @10  am   Contact information:   [address] [place]  [address] [place] [address] [phone]        Time spent on Discharge: 35 minutes  Signed:   Francys Bolin M.D. [facility] 10/29/2015, 11:36 AM Pager: [phone]

## 2015-10-29 NOTE — Telephone Encounter (Signed)
New message      Pt was discharged from hosp today.  Lasix was not on discharge list. Should he still take it.  Please call before you leave today.

## 2015-10-29 NOTE — Discharge Instructions (Addendum)
Radial Site Care °Refer to this sheet in the next few weeks. These instructions provide you with information about caring for yourself after your procedure. Your health care provider may also give you more specific instructions. Your treatment has been planned according to current medical practices, but problems sometimes occur. Call your health care provider if you have any problems or questions after your procedure. °WHAT TO EXPECT AFTER THE PROCEDURE °After your procedure, it is typical to have the following: °· Bruising at the radial site that usually fades within 1-2 weeks. °· Blood collecting in the tissue (hematoma) that may be painful to the touch. It should usually decrease in size and tenderness within 1-2 weeks. °HOME CARE INSTRUCTIONS °· Take medicines only as directed by your health care provider. °· You may shower 24-48 hours after the procedure or as directed by your health care provider. Remove the bandage (dressing) and gently wash the site with plain soap and water. Pat the area dry with a clean towel. Do not rub the site, because this may cause bleeding. °· Do not take baths, swim, or use a hot tub until your health care provider approves. °· Check your insertion site every day for redness, swelling, or drainage. °· Do not apply powder or lotion to the site. °· Do not flex or bend the affected arm for 24 hours or as directed by your health care provider. °· Do not push or pull heavy objects with the affected arm for 24 hours or as directed by your health care provider. °· Do not lift over 10 lb (4.5 kg) for 5 days after your procedure or as directed by your health care provider. °· Ask your health care provider when it is okay to: °¨ Return to work or school. °¨ Resume usual physical activities or sports. °¨ Resume sexual activity. °· Do not drive home if you are discharged the same day as the procedure. Have someone else drive you. °· You may drive 24 hours after the procedure unless otherwise  instructed by your health care provider. °· Do not operate machinery or power tools for 24 hours after the procedure. °· If your procedure was done as an outpatient procedure, which means that you went home the same day as your procedure, a responsible adult should be with you for the first 24 hours after you arrive home. °· Keep all follow-up visits as directed by your health care provider. This is important. °SEEK MEDICAL CARE IF: °· You have a fever. °· You have chills. °· You have increased bleeding from the radial site. Hold pressure on the site. °SEEK IMMEDIATE MEDICAL CARE IF: °· You have unusual pain at the radial site. °· You have redness, warmth, or swelling at the radial site. °· You have drainage (other than a small amount of blood on the dressing) from the radial site. °· The radial site is bleeding, and the bleeding does not stop after 30 minutes of holding steady pressure on the site. °· Your arm or hand becomes pale, cool, tingly, or numb. °  °This information is not intended to replace advice given to you by your health care provider. Make sure you discuss any questions you have with your health care provider. °  °Document Released: 10/29/2010 Document Revised: 10/17/2014 Document Reviewed: 04/14/2014 °Elsevier Interactive Patient Education ©2016 [employer] ° °

## 2015-10-30 ENCOUNTER — Telehealth: Payer: Self-pay | Admitting: *Deleted

## 2015-10-30 NOTE — Telephone Encounter (Signed)
Transition Care Management Follow-up Telephone Call  How have you been since you were released from the hospital? Doing better   Do you understand why you were in the hospital? yes   Do you understand the discharge instrcutions? yes  Items Reviewed:  Medications reviewed: yes  Allergies reviewed: yes  Dietary changes reviewed: yes  Referrals reviewed: yes   Functional Questionnaire:   Activities of Daily Living (ADLs):   He states they are independent in the following: ambulation, bathing and hygiene, feeding, continence, grooming, toileting and dressing States they require assistance with the following: none   Any transportation issues/concerns?: no   Any patient concerns? no   Confirmed importance and date/time of follow-up visits scheduled:    Confirmed with patient if condition begins to worsen call PCP or go to the ER.  Patient was given the Call-a-Nurse line [phone]: yes Patient was discharged to home  Patient was discharged 10/29/15 Patient has an appointment with Dr Yong Channel 11/03/15

## 2015-10-30 NOTE — Telephone Encounter (Signed)
I left a detailed message on Michelle's voicemail with instructions.  I also spoke with the pt's wife and she will have the pt restart lasix 9m daily.

## 2015-11-03 ENCOUNTER — Telehealth: Payer: Self-pay | Admitting: *Deleted

## 2015-11-03 ENCOUNTER — Ambulatory Visit: Payer: Medicare Other | Admitting: Family Medicine

## 2015-11-03 ENCOUNTER — Encounter: Payer: Self-pay | Admitting: Family Medicine

## 2015-11-03 ENCOUNTER — Ambulatory Visit (INDEPENDENT_AMBULATORY_CARE_PROVIDER_SITE_OTHER): Payer: Medicare Other | Admitting: Family Medicine

## 2015-11-03 VITALS — BP 122/70 | HR 68 | Temp 97.8°F | Wt 284.0 lb

## 2015-11-03 DIAGNOSIS — I25110 Atherosclerotic heart disease of native coronary artery with unstable angina pectoris: Secondary | ICD-10-CM | POA: Diagnosis not present

## 2015-11-03 DIAGNOSIS — I5022 Chronic systolic (congestive) heart failure: Secondary | ICD-10-CM

## 2015-11-03 DIAGNOSIS — E1151 Type 2 diabetes mellitus with diabetic peripheral angiopathy without gangrene: Secondary | ICD-10-CM

## 2015-11-03 DIAGNOSIS — G450 Vertebro-basilar artery syndrome: Secondary | ICD-10-CM

## 2015-11-03 NOTE — Progress Notes (Signed)
Garret Reddish, MD  Subjective:  [name] is a 70 y.o. year old very pleasant male patient who presents for transitional care management and hospital follow up for presumed TIA. Patient was hospitalized from 10/24/15 to 10/29/15. A TCM phone call was completed on 10/30/15. Medical complexity moderate  On day of admission patient was having difficulty operating the remote and he was also noted to have difficulty expressing himself/dysarthria. Had several episodes lasting less than 20 minutes and all resolving within several hours. Initially he was taken to [facility] but transferred to [facility] for stroke/TIA workup. He had MRA/MRI without acute findings. Echocardiogram did not show embolic source. Carotid dopplers showed 50% stenosis and not thought likely cause/origin of symptoms. Patient was on pradaxa prior to admission for a fib and history mural thrombus. He was also on Plavix for CAD. He was sent home on the same regimen he was admitted with. PT/OT/speech had no recommendations for continued follow up as thought that deficits had cleared. Granddaughter notes he still does have some mild confusion mainly associated with meds compared to his baseline but overall is doing well.   From cardiac perspective had planned cath coming up and this was completed in hospital showing severe ostial LCx stenosis, patent LAD stent, patent dominant RCA supplying diagonal collateral. Cardiology recommended medical therapy without intervention. He did have his imdur increased form 30 to 54m. In addition had some fluid overload on CXR and was restarted on lasix which he had only been taking sparingly. He has felt much better in regards to CHF and can now walk down and back 1/8th mile driveway without SOB which he could not do previously. His fatigue has improved as well. EF remained at 35% on echo.   His diabetes is followed by Dr. GCruzita Ledererand has been reasonable when using fructosamine calculation. Thyroid has  been controlled on synthroid. Blood pressure controlled on coreg, lasix, hydralazine, imdur, benicar.   ROS- no chest pain. Shortness of breath has improved to baseline. Mild confusion with meds but otherwise oriented. No headache or blurry vision.   Past Medical History-  Patient Active Problem List   Diagnosis Date Noted  . TIA (transient ischemic attack) 10/24/2015    Priority: High  . CHF (congestive heart failure), NYHA class II ([place] 05/09/2014    Priority: High  . IBD (inflammatory bowel disease) 08/09/2011    Priority: High  . Atrial fibrillation ([facility] 12/08/2010    Priority: High  . Type II diabetes mellitus with peripheral circulatory disorder ([place] 04/16/2007    Priority: High  . CAD (coronary artery disease), native coronary artery 04/12/2007    Priority: High  . Obesity 08/09/2011    Priority: Medium  . Hyperlipidemia 04/16/2007    Priority: Medium  . Hypothyroidism 04/12/2007    Priority: Medium  . Essential hypertension 04/12/2007    Priority: Medium  . OSA (obstructive sleep apnea) 04/12/2007    Priority: Medium  . Osteoarthritis of left knee 02/09/2015    Priority: Low  . Allergic rhinitis 06/25/2012    Priority: Low  . GERD (gastroesophageal reflux disease) 08/09/2011    Priority: Low  . VITAMIN B12 DEFICIENCY 11/29/2010    Priority: Low  . Acute respiratory failure with hypoxia ([place] 10/25/2015    Medications- reviewed and updated Current Outpatient Prescriptions  Medication Sig Dispense Refill  . atorvastatin (LIPITOR) 20 MG tablet Take 1 tablet (20 mg total) by mouth daily. 90 tablet 3  . carvedilol (COREG) 25 MG tablet TAKE  ONE TABLET BY MOUTH TWICE A DAY WITH A MEAL 60 tablet 10  . clopidogrel (PLAVIX) 75 MG tablet Take 75 mg by mouth daily.    . diclofenac sodium (VOLTAREN) 1 % GEL Apply 2 g topically 4 (four) times daily as needed (knee pain).    . fish oil-omega-3 fatty acids 1000 MG capsule Take 2 g by mouth daily.     . furosemide (LASIX) 40  MG tablet Take 40 mg by mouth daily.    Marland Kitchen glipiZIDE (GLUCOTROL) 10 MG tablet Take 10 mg by mouth 2 (two) times daily before a meal.    . Glucosamine-Chondroitin (OSTEO BI-FLEX REGULAR STRENGTH) 250-200 MG TABS Take 1 tablet by mouth 2 (two) times daily.     . isosorbide mononitrate (IMDUR) 60 MG 24 hr tablet Take 1 tablet (60 mg total) by mouth daily. 30 tablet 3  . levothyroxine (SYNTHROID, LEVOTHROID) 50 MCG tablet Take 50 mcg by mouth daily before breakfast.    . mesalamine (APRISO) 0.375 g 24 hr capsule Take 1.5 mg by mouth daily.    . metFORMIN (GLUCOPHAGE) 1000 MG tablet Take 1,000 mg by mouth 2 (two) times daily with a meal.    . olmesartan (BENICAR) 40 MG tablet Take 40 mg by mouth daily.    . potassium chloride SA (K-DUR,KLOR-CON) 20 MEQ tablet Take 20 mEq by mouth daily.    Marland Kitchen PRADAXA 150 MG CAPS capsule TAKE ONE TABLET BY MOUTH TWICE A DAY 60 capsule 9  . sitaGLIPtin (JANUVIA) 100 MG tablet Take 100 mg by mouth daily.    Marland Kitchen spironolactone (ALDACTONE) 25 MG tablet Take 0.5 tablets (12.5 mg total) by mouth daily. 45 tablet 3  . triamcinolone cream (KENALOG) 0.1 % Apply 1 application topically 2 (two) times daily. 30 g 0   No current facility-administered medications for this visit.    Objective: BP 122/70 mmHg  Pulse 68  Temp(Src) 97.8 F (36.6 C)  Wt 284 lb (128.822 kg) Gen: NAD, resting comfortably CV: RRR no murmurs rubs or gallops Lungs: CTAB no crackles, wheeze, rhonchi Abdomen: soft/nontender/nondistended/normal bowel sounds. No rebound or guarding.  Ext: no edema Skin: warm, dry Neuro: grossly normal, moves all extremities, no obvious dysarthria. No hand clumsiness  Assessment/Plan:  CHF (congestive heart failure), NYHA class II ([place]) S: already on ARB, beta blocker. EF stable at 35%. Has restarted being compliant with lasix and noted significant improvement in symtpoms A/P: continue current medications and lasix at 102m daily   CAD (coronary artery disease),  native coronary artery S:10/28/15 cath Dr. CBurt Knack Findings: severe ostial LCx stenosis, patent LAD stent, patent dominant RCA supplies diagonal collateral. Compliant with plavix and statin A/P: continue current medical management. Has noted improvement in SOB and fatigue on imdur and lasix   TIA (transient ischemic attack) TIA in 10/2015 trouble with remote and dysarthria- no clear residual. Already on plavix and pradaxa with no change upon discharge. Stroke workup was negative for cause. See HPI. Neuro follow up planned in 2 months.   Return precautions advised. Very strongly reinforced to call with any concerns or questions   Orders Placed This Encounter  Procedures  . CBC    [place]  . Basic metabolic panel    [place]  bmet due to lasix restart to monitor electrolytes

## 2015-11-03 NOTE — Patient Instructions (Signed)
Labs before you go  Continue lasix  Follow up with neurology and cardiology  Glad you are doing better. Hopefully Dr. Leonie Man can give [place] more insight into mild continued confusion

## 2015-11-03 NOTE — Assessment & Plan Note (Addendum)
TIA in 10/2015 trouble with remote and dysarthria- no clear residual. Already on plavix and pradaxa with no change upon discharge. Stroke workup was negative for cause. See HPI. Neuro follow up planned in 2 months.

## 2015-11-03 NOTE — Assessment & Plan Note (Signed)
S:10/28/15 cath [name]- Findings: severe ostial LCx stenosis, patent LAD stent, patent dominant RCA supplies diagonal collateral. Compliant with plavix and statin A/P: continue current medical management. Has noted improvement in SOB and fatigue on imdur and lasix

## 2015-11-03 NOTE — Telephone Encounter (Signed)
[facility] called and stated that the patient just had the pradaxa refilled, but his insurance will require a prior authorization for his next refill.

## 2015-11-03 NOTE — Assessment & Plan Note (Signed)
S: already on ARB, beta blocker. EF stable at 35%. Has restarted being compliant with lasix and noted significant improvement in symtpoms A/P: continue current medications and lasix at 51m daily

## 2015-11-04 ENCOUNTER — Other Ambulatory Visit: Payer: Medicare Other

## 2015-11-04 ENCOUNTER — Ambulatory Visit: Payer: Medicare Other | Admitting: Family Medicine

## 2015-11-04 LAB — BASIC METABOLIC PANEL
BUN: 12 mg/dL (ref 6–23)
CALCIUM: 9.2 mg/dL (ref 8.4–10.5)
CHLORIDE: 104 meq/L (ref 96–112)
CO2: 31 meq/L (ref 19–32)
Creatinine, Ser: 1.01 mg/dL (ref 0.40–1.50)
GFR: 77.67 mL/min (ref >60.00)
GLUCOSE: 72 mg/dL (ref 70–99)
POTASSIUM: 4.8 meq/L (ref 3.5–5.1)
SODIUM: 144 meq/L (ref 135–145)

## 2015-11-04 LAB — CBC
HEMATOCRIT: 44.9 % (ref 39.0–52.0)
Hemoglobin: 14.3 g/dL (ref 13.0–17.0)
MCHC: 31.8 g/dL (ref 30.0–36.0)
MCV: 83.5 fl (ref 78.0–100.0)
Platelets: 240 10*3/uL (ref 150.0–400.0)
RBC: 5.37 Mil/uL (ref 4.22–5.81)
RDW: 15.3 % (ref 11.5–15.5)
WBC: 11.6 10*3/uL — AB (ref 4.0–10.5)

## 2015-11-10 ENCOUNTER — Ambulatory Visit: Payer: Medicare Other | Admitting: Family Medicine

## 2015-11-11 ENCOUNTER — Encounter: Payer: Self-pay | Admitting: Gastroenterology

## 2015-11-13 ENCOUNTER — Ambulatory Visit (INDEPENDENT_AMBULATORY_CARE_PROVIDER_SITE_OTHER): Payer: Medicare Other | Admitting: Nurse Practitioner

## 2015-11-13 ENCOUNTER — Encounter: Payer: Self-pay | Admitting: Nurse Practitioner

## 2015-11-13 ENCOUNTER — Other Ambulatory Visit: Payer: Self-pay | Admitting: Family Medicine

## 2015-11-13 VITALS — BP 120/70 | HR 76 | Ht 69.5 in | Wt 282.4 lb

## 2015-11-13 DIAGNOSIS — I482 Chronic atrial fibrillation, unspecified: Secondary | ICD-10-CM

## 2015-11-13 DIAGNOSIS — G4733 Obstructive sleep apnea (adult) (pediatric): Secondary | ICD-10-CM

## 2015-11-13 DIAGNOSIS — I259 Chronic ischemic heart disease, unspecified: Secondary | ICD-10-CM

## 2015-11-13 DIAGNOSIS — I5042 Chronic combined systolic (congestive) and diastolic (congestive) heart failure: Secondary | ICD-10-CM

## 2015-11-13 DIAGNOSIS — Z7901 Long term (current) use of anticoagulants: Secondary | ICD-10-CM

## 2015-11-13 NOTE — Progress Notes (Signed)
CARDIOLOGY OFFICE NOTE  Date:  11/13/2015    [name] Date of Birth: [birth year] Medical Record #[id number]  PCP:  Garret Reddish, MD  Cardiologist:  Burt Knack    Chief Complaint  Patient presents with  . Congestive Heart Failure  . Coronary Artery Disease  . Atrial Fibrillation  . Shortness of Breath    Post hospital visit - seen for [name]    History of Present Illness: [name] is a 70 y.o. male who presents today for a post hospital visit. Seen for [name].   He has a history of atrial fibrillation and coronary artery disease. 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 The patient has failed cardioversion and has been managed with a strategy of rate control and anticoagulation. He's been anticoagulated with Pradaxa. He has a history of stent thrombosis and has been maintained on long-term Plavix as well.  Seen last month in the office by [name] - was not doing well. Short of breath and having chest pain. Nitrate therapy was added. Referred on for cardiac cath. Then presented with TIA-like symptoms (dysarthria) , he was transferred to [facility] from [facility] for further stroke workup. His MRI, echogram and carotids were all nonacute. MRA showed high grade right PCA stenosis. He was seen by neurology. Cardiac cath was carried out since had previously been arranged. Cardiac catheterization showed severe ostial LCx stenosis (not suitable for PCI), patent LAD stent, patent dominant RCA supplies diagonal collateral.  Cardiology recommended continue medical therapy and resuming his anticoagulation therapy. Noted to have fluctuating BP readings.   Comes back today. Here with grand daughter today. He is doing much better. He admits that the trigger for the recent events and not doing well is that he was not taking his medicines as prescribed - this included Pradaxa. He is now  compliant. Feels better. No chest pain. Breathing is good. No swelling. Weight is down a few pounds. Would like to resume cardiac rehab.   Past Medical History  Diagnosis Date  . CORONARY ARTERY DISEASE 04/12/2007    2 stents last in 2006.   Marland Kitchen DIABETES MELLITUS, TYPE II 04/16/2007  . HYPERLIPIDEMIA 04/16/2007    reveal study. not sure of medication  . HYPERTENSION 04/12/2007  . HYPOTHYROIDISM 04/12/2007  . MYOCARDIAL INFARCTION, HX OF 04/12/2007  . OBESITY 06/16/2009  . SLEEP APNEA 04/12/2007    CPAP  . ULCERATIVE COLITIS, LEFT SIDED 11/23/2010  . Ischemic cardiomyopathy     cath 35-40%  . Atrial fibrillation ([place])     initial diagnoses 2012  . B12 deficiency     per patient previously taking shots  . Diverticulosis of colon (without mention of hemorrhage)     Past Surgical History  Procedure Laterality Date  . Coronary stent placement  2008    LAD   . Cardiac catheterization  10/2002    STENT. 2 stents Dr. Maurene Capes  . Tonsillectomy    . Cardiac catheterization N/A 10/28/2015    Procedure: Right/Left Heart Cath and Coronary Angiography;  Surgeon: Sherren Mocha, MD;  Location: [facility];  Service: Cardiovascular;  Laterality: N/A;     Medications: Current Outpatient Prescriptions  Medication Sig Dispense Refill  . atorvastatin (LIPITOR) 20 MG tablet Take 1 tablet (20 mg total) by mouth daily. 90 tablet 3  . carvedilol (COREG) 25 MG tablet TAKE ONE TABLET  BY MOUTH TWICE A DAY WITH A MEAL 60 tablet 10  . clopidogrel (PLAVIX) 75 MG tablet Take 75 mg by mouth daily.    . diclofenac sodium (VOLTAREN) 1 % GEL Apply 2 g topically 4 (four) times daily as needed (knee pain).    . fish oil-omega-3 fatty acids 1000 MG capsule Take 2 g by mouth daily.     . furosemide (LASIX) 40 MG tablet Take 40 mg by mouth daily.    Marland Kitchen glipiZIDE (GLUCOTROL) 10 MG tablet Take 10 mg by mouth 2 (two) times daily before a meal.    . Glucosamine-Chondroitin (OSTEO BI-FLEX REGULAR STRENGTH) 250-200 MG TABS Take 1  tablet by mouth 2 (two) times daily.     . isosorbide mononitrate (IMDUR) 60 MG 24 hr tablet Take 1 tablet (60 mg total) by mouth daily. 30 tablet 3  . levothyroxine (SYNTHROID, LEVOTHROID) 50 MCG tablet Take 50 mcg by mouth daily before breakfast.    . mesalamine (APRISO) 0.375 g 24 hr capsule Take 1.5 mg by mouth daily.    . metFORMIN (GLUCOPHAGE) 1000 MG tablet Take 1,000 mg by mouth 2 (two) times daily with a meal.    . olmesartan (BENICAR) 40 MG tablet Take 40 mg by mouth daily.    . potassium chloride SA (K-DUR,KLOR-CON) 20 MEQ tablet Take 20 mEq by mouth daily.    Marland Kitchen PRADAXA 150 MG CAPS capsule TAKE ONE TABLET BY MOUTH TWICE A DAY 60 capsule 9  . sitaGLIPtin (JANUVIA) 100 MG tablet Take 100 mg by mouth daily.    Marland Kitchen spironolactone (ALDACTONE) 25 MG tablet Take 0.5 tablets (12.5 mg total) by mouth daily. 45 tablet 3  . triamcinolone cream (KENALOG) 0.1 % Apply 1 application topically 2 (two) times daily. 30 g 0   No current facility-administered medications for this visit.    Allergies: No Known Allergies  Social History: The patient  reports that he quit smoking about 48 years ago. His smoking use included Cigarettes. He has a 5 pack-year smoking history. He has never used smokeless tobacco. He reports that he drinks alcohol. He reports that he does not use illicit drugs.   Family History: The patient's family history includes Heart attack in his mother; Heart disease in his father; Hypertension in his brother; Obesity in his brother; Stomach cancer in his maternal aunt and paternal grandmother.   Review of Systems: Please see the history of present illness.   Otherwise, the review of systems is positive for none.   All other systems are reviewed and negative.   Physical Exam: VS:  BP 120/70 mmHg  Pulse 76  Ht 5' 9.5" (1.765 m)  Wt 282 lb 6.4 oz (128.096 kg)  BMI 41.12 kg/m2 .  BMI Body mass index is 41.12 kg/(m^2).  Wt Readings from Last 3 Encounters:  11/13/15 282 lb 6.4 oz  (128.096 kg)  11/03/15 284 lb (128.822 kg)  10/29/15 284 lb (128.822 kg)    General: Pleasant and talkative. He is morbidly obese but alert and in no acute distress.  HEENT: Normal. Neck: Supple, no JVD, carotid bruits, or masses noted.  Cardiac: Irregular irregular rhythm.  No murmurs, rubs, or gallops. No edema.  Respiratory:  Lungs are clear to auscultation bilaterally with normal work of breathing.  GI: Soft and nontender.  MS: No deformity or atrophy. Gait and ROM intact. Skin: Warm and dry. Color is normal.  Neuro:  Strength and sensation are intact and no gross focal deficits noted.  Psych: Alert,  appropriate and with normal affect.   LABORATORY DATA:  EKG:  EKG is not ordered today.  Lab Results  Component Value Date   WBC 11.6* 11/03/2015   HGB 14.3 11/03/2015   HCT 44.9 11/03/2015   PLT 240.0 11/03/2015   GLUCOSE 72 11/03/2015   CHOL 79 10/25/2015   TRIG 79 10/25/2015   HDL 47 10/25/2015   LDLDIRECT 12.8 04/02/2013   LDLCALC 16 10/25/2015   ALT 11* 10/24/2015   AST 15 10/24/2015   NA 144 11/03/2015   K 4.8 11/03/2015   CL 104 11/03/2015   CREATININE 1.01 11/03/2015   BUN 12 11/03/2015   CO2 31 11/03/2015   TSH 1.66 06/29/2015   INR 1.40 10/28/2015   HGBA1C 7.6* 10/25/2015   MICROALBUR 1.3 04/02/2013    BNP (last 3 results)  Recent Labs  10/24/15 1917  BNP 157.0*    ProBNP (last 3 results) No results for input(s): PROBNP in the last 8760 hours.   Other Studies Reviewed Today:  Procedures    Right/Left Heart Cath and Coronary Angiography from 10/28/2015    Conclusion     Prox LAD lesion, 20% stenosed. The lesion was previously treated with a drug-eluting stent greater than two years ago.  1st Diag lesion, 100% stenosed.  Ost Cx lesion, 80% stenosed.  Ost 2nd Mrg to 2nd Mrg lesion, 95% stenosed.  Mid RCA lesion, 25% stenosed.  Ost Ramus lesion, 50% stenosed.  There is moderate to severe left ventricular systolic  dysfunction.  1. Severe LCx stenosis, involving the ostium of the vessel and ostium of OM1, anatomy unsuitable for PCI 2. Continued patency of the stented segment in the LAD and total occlusion of the first diagonal with collateral flow from the RCA 3. Widely patent, dominant RCA 4. Moderately severe segmental LV dysfunction  Recommend: medical therapy for CHF/CAD. Aggressive risk reduction. Resume pradaxa tonight, anticipate DC home tomorrow.     Echo Study Conclusions from 10/2015  - Procedure narrative: Transthoracic echocardiography. Image quality was poor. The study was technically difficult, as a result of body habitus. Intravenous contrast (Definity) was administered. - Left ventricle: Wall thickness was increased in a pattern of severe LVH, however, the anterior wall and apex are thinned and hyperechoic. Definity contrast opacifies the apex, there is no mural thrombus. Systolic function was moderately reduced. The estimated ejection fraction was in the range of 35% to 40%. Anteroseptal, anterior, apical and inferoapical akinesis suggestive of prior LAD infarct. The study is not technically sufficient to allow evaluation of LV diastolic function. - Mitral valve: Mildly thickened leaflets . There was trivial regurgitation. - Left atrium: The atrium was mildly dilated. - Inferior vena cava: The vessel was dilated. The respirophasic diameter changes were blunted (< 50%), consistent with elevated central venous pressure. - Pericardium, extracardiac: Features were not consistent with tamponade physiology.  Impressions:  - Compared to a prior study in 07/2015, there has been no change. No mural thrombus    Assessment/Plan: 1. Ischemic heart disease - recent cath - results as above - managed medically - doing well clinically. Ok to go back to cardiac rehab.   2. Chronic combined systolic and diastolic HF - looks compensated. Back on  medicines.   3. ?TIA - on Pradaxa and Plavix - this most recent event probably triggered by inconsistency with taking his medicines.   4. Anticoagulation with Pradaxa and Plavix - no problems noted.   5. Morbid obesity  6. OSA - on CPAP  Current medicines are  reviewed with the patient today.  The patient does not have concerns regarding medicines other than what has been noted above.  The following changes have been made:  See above.  Labs/ tests ordered today include:   No orders of the defined types were placed in this encounter.     Disposition:   FU with [name] in 4 months.   Patient is agreeable to this plan and will call if any problems develop in the interim.   Signed: Burtis Junes, RN, ANP-C 11/13/2015 10:14 AM  [facility] [address] [place], [place]  [address] Phone: [phone] Fax: [phone]

## 2015-11-13 NOTE — Patient Instructions (Addendum)
We will be checking the following labs today - NONE   Medication Instructions:    Continue with your current medicines.   We are working on the authorization for your Pradaxa - call us next week if we have not called you    Testing/Procedures To Be Arranged:  N/A  Follow-Up:   See [name] in 3 to 4 months    Other Special Instructions:   Stay on track with taking care of yourself  Ok to resume Cardiac Rehab.     If you need a refill on your cardiac medications before your next appointment, please call your pharmacy.   Call the [place] office at [phone] if you have any questions, problems or concerns.

## 2015-11-18 ENCOUNTER — Telehealth: Payer: Self-pay | Admitting: Family Medicine

## 2015-11-18 NOTE — Telephone Encounter (Signed)
Pt received the automated call for a annual wellness exam. However pt had been hospital 2 weeks ago and has seen dr Retail banker. Would you like me to schedule later on in year?   Pt states he has seen lots of MDs lately pls advise

## 2015-11-18 NOTE — Telephone Encounter (Signed)
Prior auth for Pradaxa 168m sent to [employer]

## 2015-11-19 NOTE — Telephone Encounter (Signed)
Yes thanks, perhaps 6 months out

## 2015-11-19 NOTE — Telephone Encounter (Signed)
Pt has been scheduled.  °

## 2015-11-20 ENCOUNTER — Telehealth: Payer: Self-pay

## 2015-11-20 NOTE — Telephone Encounter (Signed)
Pradaxa 126m approved through 10/09/2016. [place]- [id number]

## 2015-11-21 ENCOUNTER — Other Ambulatory Visit: Payer: Self-pay | Admitting: Internal Medicine

## 2015-12-07 ENCOUNTER — Ambulatory Visit: Payer: Medicare Other | Admitting: Internal Medicine

## 2015-12-25 ENCOUNTER — Other Ambulatory Visit: Payer: Self-pay | Admitting: Gastroenterology

## 2016-01-06 ENCOUNTER — Encounter: Payer: Self-pay | Admitting: Internal Medicine

## 2016-01-06 ENCOUNTER — Ambulatory Visit (INDEPENDENT_AMBULATORY_CARE_PROVIDER_SITE_OTHER): Payer: Medicare Other | Admitting: Internal Medicine

## 2016-01-06 VITALS — BP 124/68 | HR 86 | Temp 97.4°F | Resp 12 | Wt 287.8 lb

## 2016-01-06 DIAGNOSIS — I259 Chronic ischemic heart disease, unspecified: Secondary | ICD-10-CM | POA: Diagnosis not present

## 2016-01-06 DIAGNOSIS — E1151 Type 2 diabetes mellitus with diabetic peripheral angiopathy without gangrene: Secondary | ICD-10-CM

## 2016-01-06 NOTE — Patient Instructions (Signed)
Please continue: - Glipizide 10 mg 2x a day - Metformin 1000 mg 2x a day - Januvia 100 mg daily   Please stop at the lab.  Please return in 3-4 months with your sugar log.

## 2016-01-06 NOTE — Progress Notes (Signed)
Patient ID: [name], male   DOB: 1946-05-28, 70 y.o.   MRN: [id number]  HPI: [name] is a 70 y.o.-year-old male, returning for DM2, dx 2004, non-insulin-dependent, uncontrolled, with complications (CAD, s/p MI). Last visit 4 mo ago.  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   Pt is on a regimen of: - Glipizide 10 mg bid  - Metformin 1000 mg bid  - Januvia 100 mg daily  He does not miss doses.  Pt checks his sugars 2-3 (we reviewed his log): - am: 108-112 >> 105-136 >> 81-93 >> 93-112 >> 92-113 >> 143-167 >> 96-118 >> 99, 102-123 >> 102-130 - 2h after breakfast: 163-281 >> 166-194 >> 116-159, 184 >> 109-139 >> 151-174 >> 133-153 >> 139-159 >> 129-149, 159 - before lunch 144 >> 110-120s >> n/c  - before dinner: 110-120s >> 85-172 >> 82-110 >> 91-122 >> 89-105 >> 139-183 >> 89-112 >> 90-111 >> 97-118, 127 - 2h after dinner: 150-306 (200s mostly) >> 177-193 >> 119-156 >> 107-136 >> 159-190 >> 140-167 >> 148-165 >> 129-152 No lows; he has hypoglycemia awareness at mid60s.   His wife is a Radiographer, therapeutic >> he is still eating sweets occasionally.  He goes to [employer] place - 3x a week (MWF) - 1h/session, starting 8:30-9 am.  He works 2x a week. He is seeing the nutritionist at the [facility].  - no CKD, last BUN/creatinine:  Lab Results  Component Value Date   BUN 12 11/03/2015   CREATININE 1.01 11/03/2015  he is on Benicar 40. - last set of lipids: Lab  Results  Component Value Date   CHOL 79 10/25/2015   HDL 47 10/25/2015   LDLCALC 16 10/25/2015   LDLDIRECT 12.8 04/02/2013   TRIG 79 10/25/2015   CHOLHDL 1.7 10/25/2015  He was in the Reveal Study x 4 years >> stopped. He is on omega 3 fatty acids. He is on Lipitor 20 mg.  - last eye exam was 07/2015 (Dr [name])  - no numbness and tingling in his feet.  He also has a history of hypothyroidism, hyperlipidemia, OSA-on CPAP, very compliant, A. Fib-on Plavix and pradaxa, IBD, GERD, vitamin B12 deficiency - previously on im B12, obesity.  Last TSH: Lab Results  Component Value Date   TSH 1.66 06/29/2015  He takes LT4 50 mcg daily.   ROS: Constitutional: no weight gain/loss, no fatigue, no subjective hyperthermia/hypothermia Eyes: no blurry vision, no xerophthalmia ENT: no sore throat, no nodules palpated in throat, no dysphagia/odynophagia, no hoarseness;  Cardiovascular: no CP/SOB/palpitations/leg swelling Respiratory: no cough/SOB Gastrointestinal: no N/V/D/C Musculoskeletal: no muscle/joint aches Skin: no rashes;   I reviewed pt's medications, allergies, PMH, social hx, family hx, and changes were documented in the history of present illness. Otherwise, unchanged from my initial visit note.  PE: BP 124/68 mmHg  Pulse 86  Temp(Src) 97.4 F (36.3 C) (Oral)  Resp 12  Wt 287 lb 12.8 oz (130.545 kg)  SpO2 97% Body mass index is 41.91 kg/(m^2).  Wt Readings from Last 3 Encounters:  01/06/16 287 lb 12.8 oz (130.545 kg)  11/13/15 282 lb 6.4 oz (128.096 kg)  11/03/15 284 lb (128.822 kg)   Constitutional: obese, protuberant abdomen, in NAD Eyes: PERRLA, EOMI, no exophthalmos ENT: moist mucous membranes, no thyromegaly, no cervical lymphadenopathy Cardiovascular: irregularly irregular, regular rate, No MRG; + bilateral leg swelling (compression hoses) Respiratory: CTA B Gastrointestinal: abdomen soft, NT, ND, BS+ Musculoskeletal: no deformities, strength intact in all  4 Skin: moist, warm, no rashes  ASSESSMENT: 1. DM2, non-insulin-dependent, uncontrolled, with complications - CAD, s/p MI 10/2006 - s/p stent - sees [name]  PLAN:  1. Patient with long-standing DM2, with much improved control after improving diet. Sugars not much changed c/w last visit, when his HbA1c (calculated from fructosamine) was 7.0%. - I suggested to continue current regimen:  Patient Instructions  Please continue: - Glipizide 10 mg 2x a day - Metformin 1000 mg 2x a day - Januvia 100 mg daily   Please stop at the lab.  Please return in 3-4 months with your sugar log.   - given more sugar logs - will check fructosamine today - UTD with eye exams - Return to clinic in 3 mo with sugar log

## 2016-01-07 LAB — FRUCTOSAMINE: FRUCTOSAMINE: 295 umol/L — AB (ref 0–285)

## 2016-01-19 ENCOUNTER — Ambulatory Visit (INDEPENDENT_AMBULATORY_CARE_PROVIDER_SITE_OTHER): Payer: Medicare Other | Admitting: Neurology

## 2016-01-19 ENCOUNTER — Encounter: Payer: Self-pay | Admitting: Neurology

## 2016-01-19 VITALS — BP 134/53 | HR 92 | Ht 69.5 in | Wt 286.4 lb

## 2016-01-19 DIAGNOSIS — G3184 Mild cognitive impairment, so stated: Secondary | ICD-10-CM

## 2016-01-19 DIAGNOSIS — I259 Chronic ischemic heart disease, unspecified: Secondary | ICD-10-CM

## 2016-01-19 MED ORDER — CEREFOLIN NAC 6-2-600 MG PO TABS: 1.0000 | ORAL_TABLET | Freq: Every day | ORAL | Status: DC

## 2016-01-19 NOTE — Progress Notes (Signed)
[facility] [address] [place]. [place] [address] [phone]       OFFICE FOLLOW-UP NOTE  [name] Date of Birth:  [birth year] Medical Record Number:  [id number]   HPI:69 year Caucasian male seen today for first office f/u visit after [facility] admission for TIA in January 2017. He is accompanied by his granddaughter. [name] is an 70 y.o. male with a past medical history significant for HTN, HLD, CAD, MI, ischemic cardiomyopathy, atrial fibrillation on pradaxa, OSA on CPAP, transferred to [facility] for TIA work up. Wife is at the bedside and stated that 2 days ago they were home watching TV when she noted that her husband was having trouble operating the remote control. She said that he was having a great deal of difficulty putting the channel that he wanted to watch and was " obviously confused, saying I can not make it to work properly". Wife also said that his speech was slurred and he was hesitant getting his words out. His grandson was home and he called EMS. By the time they arrive to [facility] the symptoms had subsided, and he estimate that the entire episode lasted about 2 hours off an on. He developed severe pain around the left eye and vomiting in the ED but denies vertigo, double vision, focal weakness or numbness, or visual disturbances. CT head while in the ED showed no acute abnormality and he was subsequently transferred to [facility] where he had MRI/MRA brain that showed no acute abnormality.  Total cholesterol 79, LDL 16, HDL 47, triglycerides 79.  TTE: EF 35-40%, no mural thrombus, left atrium mildly enlarged. He was last known well 0/17/51, time uncertain. Patient was not administered TPA secondary to symptoms resolved. The patient informs me that he may not have taken his Pradaxa regularly for a pre-few days prior to that episode. He still has done well since then and has not had any recurrent TIA or stroke symptoms. He is tolerating Pradaxa well without  bleeding or bruising. States her blood pressure is well controlled and today it is 134/53. Fasting sugars have been well. His last hemoglobin A1c was 7.63 months ago. He has been using his CPAP mask regularly for his sleep apnea. The patient is concerned about short-term memory difficulties that he has been having. He has not had any formal testing or treatment for this. ROS:   14 system review of systems is positive for  memory difficulties, restless legs, sleep apnea, easy bruising, ringing in the ears and all other systems negative  PMH:  Past Medical History  Diagnosis Date  . CORONARY ARTERY DISEASE 04/12/2007    2 stents last in 2006.   Marland Kitchen DIABETES MELLITUS, TYPE II 04/16/2007  . HYPERLIPIDEMIA 04/16/2007    reveal study. not sure of medication  . HYPERTENSION 04/12/2007  . HYPOTHYROIDISM 04/12/2007  . MYOCARDIAL INFARCTION, HX OF 04/12/2007  . OBESITY 06/16/2009  . SLEEP APNEA 04/12/2007    CPAP  . ULCERATIVE COLITIS, LEFT SIDED 11/23/2010  . Ischemic cardiomyopathy     cath 35-40%  . Atrial fibrillation ([place])     initial diagnoses 2012  . B12 deficiency     per patient previously taking shots  . Diverticulosis of colon (without mention of hemorrhage)   . Stroke [facility])     Social History:  Social History   Social History  . Marital Status: Married    Spouse Name: N/A  . Number of Children: 1  . Years of Education: N/A  Occupational History  . RETIRED    Social History Main Topics  . Smoking status: Former Smoker -- 1.00 packs/day for 5 years    Types: Cigarettes    Quit date: 04/14/1967  . Smokeless tobacco: Never Used  . Alcohol Use: Yes     Comment: social, beer once a month or two - light  . Drug Use: No  . Sexual Activity: Not on file   Other Topics Concern  . Not on file   Social History Narrative   Cardiorehab 3 days a week. 45 minutes to an hour-stationary bike.    GRANDDAUGHTER (MS. 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       Married for 37 years in 2015, married previously for 14 years. Daughter with first wife and 3 grandkids.    Lives alone with wife. Get to see grandkids a lot. New grandchild in middle of 14      Hobbies-yardwork, previously liked to hunt and fish, does some target shooting    Medications:   Current Outpatient Prescriptions on File Prior to Visit  Medication Sig Dispense Refill  . APRISO 0.375 g 24 hr capsule TAKE 4 CAPSULES ONCE DAILY 120 capsule 0  . atorvastatin (LIPITOR) 20 MG tablet Take 1 tablet (20 mg total) by mouth daily. 90 tablet 3  . carvedilol (COREG) 25 MG tablet TAKE ONE TABLET BY MOUTH TWICE A DAY WITH A MEAL 60 tablet 10  . clopidogrel (PLAVIX) 75 MG tablet Take 75 mg by mouth daily.    . diclofenac sodium (VOLTAREN) 1 % GEL Apply 2 g topically 4 (four) times daily as needed (knee pain).    . fish oil-omega-3 fatty acids 1000 MG capsule Take 2 g by mouth daily.     . furosemide (LASIX) 40 MG tablet Take 40 mg by mouth daily.    Marland Kitchen glipiZIDE (GLUCOTROL) 10 MG tablet Take 10 mg by mouth 2 (two) times daily before a meal.    . Glucosamine-Chondroitin (OSTEO BI-FLEX REGULAR STRENGTH) 250-200 MG TABS Take 1 tablet by mouth 2 (two) times daily.     . isosorbide mononitrate (IMDUR) 60 MG 24 hr tablet Take 1 tablet (60 mg total) by mouth daily. 30 tablet 3  . JANUVIA 100 MG tablet TAKE ONE (1) TABLET EACH DAY 30 tablet 1  . levothyroxine (SYNTHROID, LEVOTHROID) 50 MCG tablet Take 50 mcg by mouth daily before breakfast.    . metFORMIN (GLUCOPHAGE) 1000 MG tablet Take 1,000 mg by mouth 2 (two) times daily with a meal.    . olmesartan (BENICAR) 40 MG tablet Take 40 mg by mouth daily.    . potassium chloride SA (K-DUR,KLOR-CON) 20 MEQ tablet Take 20 mEq by mouth daily.    Marland Kitchen PRADAXA 150 MG CAPS capsule TAKE ONE TABLET BY MOUTH TWICE A DAY 60 capsule 9  . spironolactone (ALDACTONE) 25 MG tablet Take 0.5  tablets (12.5 mg total) by mouth daily. 45 tablet 3  . triamcinolone cream (KENALOG) 0.1 % Apply 1 application topically 2 (two) times daily. (Patient taking differently: Apply 1 application topically 2 (two) times daily as needed. ) 30 g 0   No current facility-administered medications on file prior to visit.    Allergies:  No Known Allergies  Physical Exam General: Obese elderly Caucasian male, seated, in no evident distress Head: head normocephalic and atraumatic.  Neck:  supple with no carotid or supraclavicular bruits Cardiovascular: regular rate and rhythm, no murmurs Musculoskeletal: no deformity Skin:  no rash/petichiae Vascular:  Normal pulses all extremities Filed Vitals:   01/19/16 1306  BP: 134/53  Pulse: 92   Neurologic Exam Mental Status: Awake and fully alert. Oriented to place and time. Recent and remote memory intact. Attention span, concentration and fund of knowledge appropriate. Mood and affect appropriate. Recall 3/3. Animal naming test 15. Cranial Nerves: Fundoscopic exam reveals sharp disc margins. Pupils equal, briskly reactive to light. Extraocular movements full without nystagmus. Visual fields full to confrontation. Hearing intact. Facial sensation intact. Face, tongue, palate moves normally and symmetrically.  Motor: Normal bulk and tone. Normal strength in all tested extremity muscles. Sensory.: intact to touch ,pinprick .position and vibratory sensation.  Coordination: Rapid alternating movements normal in all extremities. Finger-to-nose and heel-to-shin performed accurately bilaterally. Gait and Station: Arises from chair without difficulty. Stance is normal. Gait demonstrates normal stride length and balance . Able to heel, toe and tandem walk without difficulty.  Reflexes: 1+ and symmetric. Toes downgoing.   NIHSS 0 Modified Rankin  1  ASSESSMENT: 70 year Caucasian male with TIA in January 2017 with multiple vascular risk factors of atrial  fibrillation, diabetes, hypertension, hyperlipidemia, obesity, sleep apnea, coronary artery disease. New complaints of memory difficulties due to mild cognitive impairment.    PLAN: I had a long d/w patient and grand daughter about his recent TIA, atrial fibrillation, risk for recurrent stroke/TIAs, personally independently reviewed imaging studies and stroke evaluation results and answered questions.Continue Pradaxa for secondary stroke prevention and maintain strict control of hypertension with blood pressure goal below 130/90, diabetes with hemoglobin A1c goal below 6.5% and lipids with LDL cholesterol goal below 70 mg/dL. I counseled him to use his CPAP mask every night for his sleep apnea I also advised the patient to eat a healthy diet with plenty of whole grains, cereals, fruits and vegetables, exercise regularly and maintain ideal body weight. We also discussed about his mild memory difficulties which I think are due to age related mild cognitive impairment. I also advised him to do increase participation in mentally challenging activities like solving crossword puzzles, sudoku and to try Cerefolin NAC 1 tablet daily as well as fish oil to help with his mild cognitive impairment. We also discussed memory compensation strategies. Followup in the future with me in 6 months or call earlier if necessary. Greater than 50% of time during this 25 minute visit was spent on counseling,explanation of diagnosis, planning of further management, discussion with patient and family and coordination of care Antony Contras, MD Note: This document was prepared with digital dictation and possible smart phrase technology. Any transcriptional errors that result from this process are unintentional

## 2016-01-19 NOTE — Patient Instructions (Signed)
I had a long d/w patient and grand daughter about his recent TIA, atrial fibrillation, risk for recurrent stroke/TIAs, personally independently reviewed imaging studies and stroke evaluation results and answered questions.Continue Pradaxa for secondary stroke prevention and maintain strict control of hypertension with blood pressure goal below 130/90, diabetes with hemoglobin A1c goal below 6.5% and lipids with LDL cholesterol goal below 70 mg/dL. I also advised the patient to eat a healthy diet with plenty of whole grains, cereals, fruits and vegetables, exercise regularly and maintain ideal body weight. We also discussed about his mild memory difficulties which I think are due to age related mild cognitive impairment. I also advised him to do increase participation in mentally challenging activities like solving crossword puzzles, sudoku and to try Cerefolin NAC 1 tablet daily as well as fish oil to help with his mild cognitive impairment. We also discussed memory compensation strategies. Followup in the future with me in 6 months or call earlier if necessary. Memory Compensation Strategies  1. Use "WARM" strategy.  W= write it down  A= associate it  R= repeat it  M= make a mental note  2.   You can keep a Social worker.  Use a 3-ring notebook with sections for the following: calendar, important names and phone numbers,  medications, doctors' names/phone numbers, lists/reminders, and a section to journal what you did  each day.   3.    Use a calendar to write appointments down.  4.    Write yourself a schedule for the day.  This can be placed on the calendar or in a separate section of the Memory Notebook.  Keeping a  regular schedule can help memory.  5.    Use medication organizer with sections for each day or morning/evening pills.  You may need help loading it  6.    Keep a basket, or pegboard by the door.  Place items that you need to take out with you in the basket or on the pegboard.   You may also want to  include a message board for reminders.  7.    Use sticky notes.  Place sticky notes with reminders in a place where the task is performed.  For example: " turn off the  stove" placed by the stove, "lock the door" placed on the door at eye level, " take your medications" on  the bathroom mirror or by the place where you normally take your medications.  8.    Use alarms/timers.  Use while cooking to remind yourself to check on food or as a reminder to take your medicine, or as a  reminder to make a call, or as a reminder to perform another task, etc.

## 2016-01-21 ENCOUNTER — Other Ambulatory Visit: Payer: Self-pay | Admitting: Family Medicine

## 2016-01-21 ENCOUNTER — Other Ambulatory Visit: Payer: Self-pay | Admitting: Gastroenterology

## 2016-01-21 ENCOUNTER — Other Ambulatory Visit: Payer: Self-pay | Admitting: Internal Medicine

## 2016-01-28 ENCOUNTER — Ambulatory Visit (INDEPENDENT_AMBULATORY_CARE_PROVIDER_SITE_OTHER): Payer: Medicare Other | Admitting: Gastroenterology

## 2016-01-28 ENCOUNTER — Encounter: Payer: Self-pay | Admitting: Gastroenterology

## 2016-01-28 ENCOUNTER — Telehealth: Payer: Self-pay | Admitting: *Deleted

## 2016-01-28 VITALS — BP 116/80 | HR 72 | Ht 69.5 in | Wt 287.0 lb

## 2016-01-28 DIAGNOSIS — K6389 Other specified diseases of intestine: Secondary | ICD-10-CM | POA: Diagnosis not present

## 2016-01-28 DIAGNOSIS — I259 Chronic ischemic heart disease, unspecified: Secondary | ICD-10-CM

## 2016-01-28 DIAGNOSIS — G459 Transient cerebral ischemic attack, unspecified: Secondary | ICD-10-CM | POA: Diagnosis not present

## 2016-01-28 DIAGNOSIS — K529 Noninfective gastroenteritis and colitis, unspecified: Secondary | ICD-10-CM

## 2016-01-28 DIAGNOSIS — Z7901 Long term (current) use of anticoagulants: Secondary | ICD-10-CM | POA: Diagnosis not present

## 2016-01-28 DIAGNOSIS — I482 Chronic atrial fibrillation, unspecified: Secondary | ICD-10-CM

## 2016-01-28 MED ORDER — MESALAMINE ER 0.375 G PO CP24: ORAL_CAPSULE | ORAL | Status: DC

## 2016-01-28 NOTE — Telephone Encounter (Signed)
01/28/2016  RE: ZAID TOMES DOB: [birth year] MRN: [id number]  Dear Dr Burt Knack,   We would like to schedule the above patient for a colonoscopy procedure. Our records show that he is on anticoagulation therapy.  Please advise as to whether the patient may come off his therapy of Plavix 5 days prior to the procedure and Pradaxa 2 days prior, which will be scheduled for February 2018. Please route your response to Dixon Boos, CMA.  Sincerely,  Dixon Boos

## 2016-01-28 NOTE — Assessment & Plan Note (Addendum)
Refill Apriso 1.5 g daily. Blood work in [place] performed in January 2017 was reviewed. He is due for surveillance colonoscopy however with recent TIA and cardiac cath likely will not be reasonable to hold Plavix and Pradaxa which he takes for afib, CAD and TIA. Will review with his cardiologist however I anticipate a recommendation to wait several months or 1 year for elective hold of Plavix and Pradaxa from time of TIA and cath. I spent 25 minutes of face-to-face time with the patient. Greater than 50% of the time was spent counseling and coordinating care.

## 2016-01-28 NOTE — Patient Instructions (Signed)
We have sent the following medications to your pharmacy for you to pick up at your convenience: [name] will be due for a recall colonoscopy in 11/2016. We will send you a reminder in the mail when it gets closer to that time.  We will contact Dr Burt Knack to see if you will be able to hold your plavix and pradaxa at the time of your colonoscopy.  If you are age 70 or older, your body mass index should be between 23-30. Your Body mass index is 41.79 kg/(m^2). If this is out of the aforementioned range listed, please consider follow up with your Primary Care Provider.  If you are age 86 or younger, your body mass index should be between 19-25. Your Body mass index is 41.79 kg/(m^2). If this is out of the aformentioned range listed, please consider follow up with your Primary Care Provider.

## 2016-01-28 NOTE — Progress Notes (Signed)
    History of Present Illness: This is a 69 year old male returning for follow-up of ulcerative colitis. He had a TIA and underwent a cardiac catheterization in January 2017. He is maintained on Plavix and Pradaxa for CAD, TIA and afib. He has a history of OSA, DM, CHF. He has no gastrointestinal complaints. He was due for a five-year surveillance colonoscopy in February 2017. Denies weight loss, abdominal pain, constipation, diarrhea, change in stool caliber, melena, hematochezia, nausea, vomiting, dysphagia, reflux symptoms, chest pain.   Current Medications, Allergies, Past Medical History, Past Surgical History, Family History and Social History were reviewed in [employer] record.  Physical Exam: General: Well developed, well nourished, obese, no acute distress Head: Normocephalic and atraumatic Eyes:  sclerae anicteric, EOMI Ears: Normal auditory acuity Mouth: No deformity or lesions Lungs: Clear throughout to auscultation Heart: Regular rate and rhythm; no murmurs, rubs or bruits Abdomen: Soft, non tender and non distended. No masses, hepatosplenomegaly or hernias noted. Normal Bowel sounds Musculoskeletal: Symmetrical with no gross deformities  Pulses:  Normal pulses noted Extremities: No clubbing, cyanosis, edema or deformities noted Neurological: Alert oriented x 4, grossly nonfocal Psychological:  Alert and cooperative. Normal mood and affect  Assessment and Recommendations:

## 2016-01-31 NOTE — Telephone Encounter (Signed)
This is ok to hold plavix 5 days. thx

## 2016-02-01 NOTE — Telephone Encounter (Signed)
He is scheduled to see me in June. Will address at that time. The patient has extensive coronary disease and heart failure and is a little hard to clear him 10 months in advance for this.

## 2016-02-01 NOTE — Telephone Encounter (Signed)
Will patient be okay to hold Pradaxa 2 days prior to a February 2018 colonoscopy as well pending no additional medical problems arise?

## 2016-02-01 NOTE — Telephone Encounter (Signed)
I have spoken to patient to advise that Dr Burt Knack will discuss Pradaxa at his appointment in June and we can revisit anticoag clearance in February when he is due for colonoscopy.

## 2016-02-24 ENCOUNTER — Ambulatory Visit (INDEPENDENT_AMBULATORY_CARE_PROVIDER_SITE_OTHER): Payer: Medicare Other | Admitting: Pulmonary Disease

## 2016-02-24 ENCOUNTER — Encounter: Payer: Self-pay | Admitting: Pulmonary Disease

## 2016-02-24 VITALS — BP 146/86 | HR 85 | Ht 70.0 in | Wt 288.6 lb

## 2016-02-24 DIAGNOSIS — I259 Chronic ischemic heart disease, unspecified: Secondary | ICD-10-CM

## 2016-02-24 DIAGNOSIS — G4733 Obstructive sleep apnea (adult) (pediatric): Secondary | ICD-10-CM

## 2016-02-24 NOTE — Patient Instructions (Signed)
CPAP supplies will be renewed-[facility] [place] The machine is set at 13 cm and seems to be ineffective  Blood pressure recheck

## 2016-02-24 NOTE — Progress Notes (Signed)
   Subjective:    Patient ID: [name], male    DOB: 07-Aug-1946, 70 y.o.   MRN: [id number]  HPI    Review of Systems  Constitutional: Negative for fever, chills, activity change, appetite change and unexpected weight change.  HENT: Negative for congestion, dental problem, postnasal drip, rhinorrhea, sneezing, sore throat, trouble swallowing and voice change.   Eyes: Negative for visual disturbance.  Respiratory: Negative for cough, choking and shortness of breath.   Cardiovascular: Negative for chest pain and leg swelling.  Gastrointestinal: Negative for nausea, vomiting and abdominal pain.  Genitourinary: Negative for difficulty urinating.  Musculoskeletal: Negative for arthralgias.  Skin: Negative for rash.  Psychiatric/Behavioral: Negative for behavioral problems and confusion.       Objective:   Physical Exam        Assessment & Plan:

## 2016-02-24 NOTE — Assessment & Plan Note (Signed)
Prescription will be sent for new CPAP supplies and a large full facemask He seems to be very compliant with CPAP which is set at 13 cm and this pressure seems to be adequate  Weight loss encouraged, compliance with goal of at least 4-6 hrs every night is the expectation. Advised against medications with sedative side effects Cautioned against driving when sleepy - understanding that sleepiness will vary on a day to day basis

## 2016-02-24 NOTE — Progress Notes (Signed)
Subjective:    Patient ID: [name], male    DOB: Dec 21, 1945, 70 y.o.   MRN: [id number]  HPI  Chief Complaint  Patient presents with  . Sleep Consult    referred by Dr. Yong Channel, pt was placed on CPAP during hospital stay in 2014.  Patient doing well on CPAP.  needed to be set up with new supplies.     70 year old man with ischemic cardiomyopathy presents for management of OSA. He syncopized in by his granddaughter Sharyn Lull who was a nurse at [facility] [employer]  PSG 02/2011 showed severe OSA with AHI 96/hour and lowest desaturation of 74%. This was emergently corrected by CPAP of 13 cm with a large full face Quattro mask. He has since been maintained on CPAP with excellent results-he reports improvement in his daytime somnolence and fatigue. No snoring has been noted by family members he sleeps on a wedge with his dog by his side  Epworth sleepiness score is 11 Bedtime is around 11 PM, sleep latency is minimal, he sleeps on his side with one pillow, reports one nocturnal awakening for nocturia and is out of bed by 6 AM feeling refreshed without headaches or dryness of mouth. His weight is mostly on change and seems to fluctuate due to fluid retention There is no history suggestive of cataplexy, sleep paralysis or parasomnias  He wants to get new supplies and has been denied due to lack of prescription.  Download report was obtained and reviewed which shows no residual events on CPAP of 13 cm, no significant leak and excellent compliance He reports that sometimes. Higinio Plan grow out and this causes a little bit of leak     Past Medical History  Diagnosis Date  . CORONARY ARTERY DISEASE 04/12/2007    2 stents last in 2006.   Marland Kitchen DIABETES MELLITUS, TYPE II 04/16/2007  . HYPERLIPIDEMIA 04/16/2007    reveal study. not sure of medication  . HYPERTENSION 04/12/2007  . HYPOTHYROIDISM 04/12/2007  . MYOCARDIAL INFARCTION, HX OF 04/12/2007  . OBESITY 06/16/2009  . SLEEP APNEA 04/12/2007    CPAP  . ULCERATIVE  COLITIS, LEFT SIDED 11/23/2010  . Ischemic cardiomyopathy     cath 35-40%  . Atrial fibrillation ([place])     initial diagnoses 2012  . B12 deficiency     per patient previously taking shots  . Diverticulosis of colon (without mention of hemorrhage)   . Stroke [facility])     Past Surgical History  Procedure Laterality Date  . Coronary stent placement  2008    LAD   . Cardiac catheterization  10/2002    STENT. 2 stents Dr. Maurene Capes  . Tonsillectomy    . Cardiac catheterization N/A 10/28/2015    Procedure: Right/Left Heart Cath and Coronary Angiography;  Surgeon: Sherren Mocha, MD;  Location: [place] [facility];  Service: Cardiovascular;  Laterality: N/A;   No Known Allergies  Social History   Social History  . Marital Status: Married    Spouse Name: N/A  . Number of Children: 1  . Years of Education: N/A   Occupational History  . RETIRED    Social History Main Topics  . Smoking status: Former Smoker -- 1.00 packs/day for 5 years    Types: Cigarettes    Quit date: 04/14/1967  . Smokeless tobacco: Never Used  . Alcohol Use: Yes     Comment: social, beer once a month or two - light  . Drug Use: No  . Sexual Activity: Not on file  Other Topics Concern  . Not on file   Social History Narrative   Cardiorehab 3 days a week. 45 minutes to an hour-stationary bike.    GRANDDAUGHTER (MS. 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       Married for 37 years in 2015, married previously for 14 years. Daughter with first wife and 3 grandkids.    Lives alone with wife. Get to see grandkids a lot. New grandchild in middle of 70      Hobbies-yardwork, previously liked to hunt and fish, does some target shooting     Family History  Problem Relation Age of Onset  . Heart attack Mother     mid 46s  . Heart disease Father     H/O CAD, CABG, VALVE SURGERY  . Hypertension Brother   .  Obesity Brother   . Stomach cancer Maternal Aunt   . Stomach cancer Paternal Grandmother     ? colon       Review of Systems neg for any significant sore throat, dysphagia, itching, sneezing, nasal congestion or excess/ purulent secretions, fever, chills, sweats, unintended wt loss, pleuritic or exertional cp, hempoptysis, orthopnea pnd or change in chronic leg swelling. Also denies presyncope, palpitations, heartburn, abdominal pain, nausea, vomiting, diarrhea or change in bowel or urinary habits, dysuria,hematuria, rash, arthralgias, visual complaints, headache, numbness weakness or ataxia.     Objective:   Physical Exam  Gen. Pleasant, obese, in no distress ENT - no lesions, no post nasal drip Neck: No JVD, no thyromegaly, no carotid bruits Lungs: no use of accessory muscles, no dullness to percussion, decreased without rales or rhonchi  Cardiovascular: Rhythm regular, heart sounds  normal, no murmurs or gallops, no peripheral edema Musculoskeletal: No deformities, no cyanosis or clubbing , no tremors       Assessment & Plan:

## 2016-03-01 ENCOUNTER — Other Ambulatory Visit: Payer: Self-pay | Admitting: *Deleted

## 2016-03-01 MED ORDER — ISOSORBIDE MONONITRATE ER 60 MG PO TB24: 60.0000 mg | ORAL_TABLET | Freq: Every day | ORAL | Status: DC

## 2016-03-02 ENCOUNTER — Encounter: Payer: Self-pay | Admitting: Pulmonary Disease

## 2016-03-28 ENCOUNTER — Encounter: Payer: Self-pay | Admitting: Cardiovascular Disease

## 2016-03-28 ENCOUNTER — Ambulatory Visit (INDEPENDENT_AMBULATORY_CARE_PROVIDER_SITE_OTHER): Payer: Medicare Other | Admitting: Cardiovascular Disease

## 2016-03-28 VITALS — BP 126/92 | HR 86 | Ht 70.0 in | Wt 288.6 lb

## 2016-03-28 DIAGNOSIS — I5022 Chronic systolic (congestive) heart failure: Secondary | ICD-10-CM | POA: Diagnosis not present

## 2016-03-28 DIAGNOSIS — I2510 Atherosclerotic heart disease of native coronary artery without angina pectoris: Secondary | ICD-10-CM | POA: Diagnosis not present

## 2016-03-28 DIAGNOSIS — I480 Paroxysmal atrial fibrillation: Secondary | ICD-10-CM | POA: Diagnosis not present

## 2016-03-28 DIAGNOSIS — I259 Chronic ischemic heart disease, unspecified: Secondary | ICD-10-CM | POA: Diagnosis not present

## 2016-03-28 DIAGNOSIS — I4891 Unspecified atrial fibrillation: Secondary | ICD-10-CM | POA: Diagnosis not present

## 2016-03-28 LAB — CBC
HCT: 39.8 % (ref 38.5–50.0)
Hemoglobin: 13.5 g/dL (ref 13.2–17.1)
MCH: 28.2 pg (ref 27.0–33.0)
MCHC: 33.9 g/dL (ref 32.0–36.0)
MCV: 83.1 fL (ref 80.0–100.0)
MPV: 9.5 fL (ref 7.5–12.5)
PLATELETS: 157 10*3/uL (ref 140–400)
RBC: 4.79 MIL/uL (ref 4.20–5.80)
RDW: 15 % (ref 11.0–15.0)
WBC: 9.3 10*3/uL (ref 3.8–10.8)

## 2016-03-28 LAB — COMPREHENSIVE METABOLIC PANEL
ALT: 10 U/L (ref 9–46)
AST: 13 U/L (ref 10–35)
Albumin: 4.2 g/dL (ref 3.6–5.1)
Alkaline Phosphatase: 79 U/L (ref 40–115)
BILIRUBIN TOTAL: 0.7 mg/dL (ref 0.2–1.2)
BUN: 14 mg/dL (ref 7–25)
CALCIUM: 9.2 mg/dL (ref 8.6–10.3)
CO2: 30 mmol/L (ref 20–31)
CREATININE: 1 mg/dL (ref 0.70–1.18)
Chloride: 100 mmol/L (ref 98–110)
GLUCOSE: 158 mg/dL — AB (ref 65–99)
Potassium: 4.4 mmol/L (ref 3.5–5.3)
SODIUM: 139 mmol/L (ref 135–146)
Total Protein: 6.7 g/dL (ref 6.1–8.1)

## 2016-03-28 NOTE — Patient Instructions (Signed)
Medication Instructions:  Your physician recommends that you continue on your current medications as directed. Please refer to the Current Medication list given to you today.  Labwork: Your physician recommends that you have lab work today: CBC and CMP  Testing/Procedures: Your physician has requested that you have an echocardiogram in January. Echocardiography is a painless test that uses sound waves to create images of your heart. It provides your doctor with information about the size and shape of your heart and how well your heart's chambers and valves are working. This procedure takes approximately one hour. There are no restrictions for this procedure.  Follow-Up: Your physician wants you to follow-up in: January 2018 with Dr Burt Knack.  You will receive a reminder letter in the mail two months in advance. If you don't receive a letter, please call our office to schedule the follow-up appointment.   Any Other Special Instructions Will Be Listed Below (If Applicable).     If you need a refill on your cardiac medications before your next appointment, please call your pharmacy.

## 2016-03-28 NOTE — Progress Notes (Signed)
Cardiology Office Note Date:  03/28/2016   ID:  [name], DOB 1946/01/02, MRN [id number]  PCP:  Garret Reddish, MD  Cardiologist:  Sherren Mocha, MD    Chief Complaint  Patient presents with  . Shortness of Breath    History of Present Illness: [name] is a 70 y.o. male who presents for follow-up evaluation. The patient is been followed for atrial fibrillation and coronary artery disease. Early this year he was hospitalized with congestive heart failure. He has combined systolic and diastolic heart failure with an LV ejection fraction of 35%. Cardiac catheterization during his hospitalization showed progressive and severe ostial stenosis of the left circumflex. He had continued patency of his LAD stent and a patent nondominant right coronary artery supplying collaterals to the diagonal branch of the LAD. Ongoing medical therapy was recommended. Other comorbid conditions include obstructive sleep apnea on CPAP, morbid obesity, type 2 diabetes, hypertension, and hyperlipidemia. The patient has failed cardioversion and his atrial fibrillation is managed with the strategy of rate control and anticoagulation. He also has a history of stent thrombosis and he has been maintained on long-term clopidogrel.  The patient is doing better. He is now taking his medicines regularly. 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 He has frequent urination after taking his Lasix in the morning. Breathing is stable with mild exertional dyspnea. He's had no chest pain or pressure. He denies any change in his chronic leg swelling. He denies orthopnea or PND. He is not controlling his diet at all. States that this is a lifelong problem and he doesn't feel like he is capable of dieting or losing weight.  Past Medical History  Diagnosis Date  . CORONARY ARTERY DISEASE 04/12/2007    2 stents last in 2006.   Marland Kitchen DIABETES MELLITUS, TYPE II 04/16/2007  . HYPERLIPIDEMIA 04/16/2007    reveal study. not sure of medication  .  HYPERTENSION 04/12/2007  . HYPOTHYROIDISM 04/12/2007  . MYOCARDIAL INFARCTION, HX OF 04/12/2007  . OBESITY 06/16/2009  . SLEEP APNEA 04/12/2007    CPAP  . ULCERATIVE COLITIS, LEFT SIDED 11/23/2010  . Ischemic cardiomyopathy     cath 35-40%  . Atrial fibrillation ([place])     initial diagnoses 2012  . B12 deficiency     per patient previously taking shots  . Diverticulosis of colon (without mention of hemorrhage)   . Stroke [facility])     Past Surgical History  Procedure Laterality Date  . Coronary stent placement  2008    LAD   . Cardiac catheterization  10/2002    STENT. 2 stents Dr. Maurene Capes  . Tonsillectomy    . Cardiac catheterization N/A 10/28/2015    Procedure: Right/Left Heart Cath and Coronary Angiography;  Surgeon: Sherren Mocha, MD;  Location: [place] [facility];  Service: Cardiovascular;  Laterality: N/A;    Current Outpatient Prescriptions  Medication Sig Dispense Refill  . atorvastatin (LIPITOR) 20 MG tablet Take 1 tablet (20 mg total) by mouth daily. 90 tablet 3  . carvedilol (COREG) 25 MG tablet TAKE ONE TABLET BY MOUTH TWICE A DAY WITH A MEAL 60 tablet 10  . clopidogrel (PLAVIX) 75 MG tablet Take 75 mg by mouth daily.    . diclofenac sodium (VOLTAREN) 1 % GEL Apply 2 g topically 4 (four) times daily as needed (knee pain).    . fish oil-omega-3 fatty acids 1000 MG capsule Take 2 g by mouth daily.     . furosemide (LASIX) 40 MG tablet Take  40 mg by mouth daily.    Marland Kitchen glipiZIDE (GLUCOTROL) 10 MG tablet TAKE ONE TABLET TWICE DAILY 180 tablet 0  . glucosamine-chondroitin 500-400 MG tablet Take 1 tablet by mouth daily as needed (knee flare up or pain).    . isosorbide mononitrate (IMDUR) 60 MG 24 hr tablet Take 1 tablet (60 mg total) by mouth daily. 30 tablet 6  . JANUVIA 100 MG tablet TAKE ONE (1) TABLET EACH DAY 90 tablet 0  . levothyroxine (SYNTHROID, LEVOTHROID) 50 MCG tablet Take 50 mcg by mouth daily before breakfast.    . mesalamine (APRISO) 0.375 g 24 hr capsule TAKE 4 CAPSULES  ONCE DAILY 120 capsule 7  . metFORMIN (GLUCOPHAGE) 1000 MG tablet TAKE ONE TABLET TWICE DAILY WITH A MEAL 180 tablet 0  . Methylfol-Methylcob-Acetylcyst (CEREFOLIN NAC) 6-2-600 MG TABS Take 1 tablet by mouth daily. 90 each 3  . olmesartan (BENICAR) 40 MG tablet Take 40 mg by mouth daily.    . potassium chloride SA (K-DUR,KLOR-CON) 20 MEQ tablet Take 20 mEq by mouth daily.    Marland Kitchen PRADAXA 150 MG CAPS capsule TAKE ONE TABLET BY MOUTH TWICE A DAY 60 capsule 9  . spironolactone (ALDACTONE) 25 MG tablet Take 0.5 tablets (12.5 mg total) by mouth daily. 45 tablet 3  . triamcinolone cream (KENALOG) 0.1 % Apply 1 application topically 2 (two) times daily. (Patient taking differently: Apply 1 application topically 2 (two) times daily as needed. ) 30 g 0   No current facility-administered medications for this visit.    Allergies:   Review of patient's allergies indicates no known allergies.   Social History:  The patient  reports that he quit smoking about 49 years ago. His smoking use included Cigarettes. He has a 5 pack-year smoking history. He has never used smokeless tobacco. He reports that he drinks alcohol. He reports that he does not use illicit drugs.   Family History:  The patient's family history includes Heart attack in his mother; Heart disease in his father; Hypertension in his brother; Obesity in his brother; Stomach cancer in his maternal aunt and paternal grandmother.    ROS:  Please see the history of present illness.  Otherwise, review of systems is positive for hearing loss.  All other systems are reviewed and negative.   PHYSICAL EXAM: VS:  BP 126/92 mmHg  Pulse 86  Ht _0  (1.778 m)  Wt 288 lb 9.6 oz (130.908 kg)  BMI 41.41 kg/m2  SpO2 98% , BMI Body mass index is 41.41 kg/(m^2). GEN: Well nourished, well developed, in no acute distress HEENT: normal Neck: no JVD, no masses. No carotid bruits Cardiac: irregular without murmur or gallop                Respiratory:  clear to  auscultation bilaterally, normal work of breathing GI: soft, nontender, nondistended, + BS MS: no deformity or atrophy Ext: trace pretibial edema Skin: warm and dry, no rash Neuro:  Strength and sensation are intact Psych: euthymic mood, full affect  EKG:  EKG is ordered today. The ekg ordered today shows atrial fibrillation 86 bpm, nonspecific IVCD, ST and T-wave abnormality consider inferolateral ischemia  Recent Labs: 06/29/2015: TSH 1.66 10/24/2015: B Natriuretic Peptide 157.0* 03/28/2016: ALT 10; BUN 14; Creat 1.00; Hemoglobin 13.5; Platelets 157; Potassium 4.4; Sodium 139   Lipid Panel     Component Value Date/Time   CHOL 79 10/25/2015 0518   TRIG 79 10/25/2015 0518   HDL 47 10/25/2015 0518   CHOLHDL  1.7 10/25/2015 0518   VLDL 16 10/25/2015 0518   LDLCALC 16 10/25/2015 0518   LDLDIRECT 12.8 04/02/2013 0821      Wt Readings from Last 3 Encounters:  03/28/16 288 lb 9.6 oz (130.908 kg)  02/24/16 288 lb 9.6 oz (130.908 kg)  01/28/16 287 lb (130.182 kg)     Cardiac Studies Reviewed: Cardiac Cath 10-28-2015: Conclusion     Prox LAD lesion, 20% stenosed. The lesion was previously treated with a drug-eluting stent greater than two years ago.  1st Diag lesion, 100% stenosed.  Ost Cx lesion, 80% stenosed.  Ost 2nd Mrg to 2nd Mrg lesion, 95% stenosed.  Mid RCA lesion, 25% stenosed.  Ost Ramus lesion, 50% stenosed.  There is moderate to severe left ventricular systolic dysfunction.  1. Severe LCx stenosis, involving the ostium of the vessel and ostium of OM1, anatomy unsuitable for PCI 2. Continued patency of the stented segment in the LAD and total occlusion of the first diagonal with collateral flow from the RCA 3. Widely patent, dominant RCA 4. Moderately severe segmental LV dysfunction  Recommend: medical therapy for CHF/CAD. Aggressive risk reduction. Resume pradaxa tonight, anticipate DC home tomorrow.   Echo 10-25-2015: Study Conclusions  - Procedure  narrative: Transthoracic echocardiography. Image  quality was poor. The study was technically difficult, as a  result of body habitus. Intravenous contrast (Definity) was  administered. - Left ventricle: Wall thickness was increased in a pattern of  severe LVH, however, the anterior wall and apex are thinned and  hyperechoic. Definity contrast opacifies the apex, there is no  mural thrombus. Systolic function was moderately reduced. The  estimated ejection fraction was in the range of 35% to 40%.  Anteroseptal, anterior, apical and inferoapical akinesis  suggestive of prior LAD infarct. The study is not technically  sufficient to allow evaluation of LV diastolic function. - Mitral valve: Mildly thickened leaflets . There was trivial  regurgitation. - Left atrium: The atrium was mildly dilated. - Inferior vena cava: The vessel was dilated. The respirophasic  diameter changes were blunted (< 50%), consistent with elevated  central venous pressure. - Pericardium, extracardiac: Features were not consistent with  tamponade physiology.  Impressions:  - Compared to a prior study in 07/2015, there has been no change.  No mural thrombus.  Carotid duplex 10-25-2015: RIGHT CAROTID ARTERY: Mild calcified plaque in the bulb. Low resistance internal carotid Doppler pattern.  RIGHT VERTEBRAL ARTERY: Antegrade. Low resistance Doppler pattern.  LEFT CAROTID ARTERY: Minimal plaque in the bulb. Low resistance internal carotid Doppler pattern.  LEFT VERTEBRAL ARTERY: Antegrade. Low resistance Doppler pattern.  IMPRESSION: Less than 50% stenosis in the right and left internal carotid Arteries.  ASSESSMENT AND PLAN: 1.  Chronic combined systolic and diastolic heart failure: New York Heart Association functional class 2. Overall he appears stable. No medicine changes are recommended today. He really needs to lose weight and we spent a long time talking about his eating  habits today.  2. Coronary artery disease, native vessel: no angina. Continue current Rx. Plavix monotherapy in the setting of chronic oral anticoagulation.  3. Obstructive sleep apnea: Treated with CPAP  4. Chronic atrial fibrillation: rate-controlled. Tolerating anticoagulation without bleeding problems.   Current medicines are reviewed with the patient today.  The patient does not have concerns regarding medicines.  Labs/ tests ordered today include:   Orders Placed This Encounter  Procedures  . CBC  . Comp Met (CMET)  . EKG 12-Lead    Disposition:   FU  6 months  Signed, Sherren Mocha, MD  03/28/2016 5:21 PM    [address], [place], [place]  [address] Phone: [phone]; Fax: [phone]

## 2016-04-04 ENCOUNTER — Telehealth: Payer: Self-pay | Admitting: Pulmonary Disease

## 2016-04-04 DIAGNOSIS — G4733 Obstructive sleep apnea (adult) (pediatric): Secondary | ICD-10-CM

## 2016-04-04 NOTE — Telephone Encounter (Signed)
Called [place] at [facility], he said that he needs a full order for CPAP and supplies sent with settings.  They lost the contract for patient's insurance company and patient will have to transfer to another DME.  They wanted to get [place] to send a new order so they can get patient's machine set up through another company.  Dr. Elsworth Soho, do you want the order to have 13cwp as the setting? Please advise.

## 2016-04-04 NOTE — Telephone Encounter (Signed)
Yes- CPAP 13 cm, FF mask

## 2016-04-04 NOTE — Telephone Encounter (Signed)
Order has been placed. Nothing further needed. 

## 2016-04-19 ENCOUNTER — Other Ambulatory Visit: Payer: Self-pay | Admitting: Internal Medicine

## 2016-04-28 ENCOUNTER — Other Ambulatory Visit: Payer: Self-pay | Admitting: Internal Medicine

## 2016-04-28 ENCOUNTER — Encounter: Payer: Self-pay | Admitting: Cardiovascular Disease

## 2016-05-06 MED ORDER — GLIPIZIDE 10 MG PO TABS: 10.0000 mg | ORAL_TABLET | Freq: Two times a day (BID) | ORAL | 0 refills | Status: DC

## 2016-05-06 MED ORDER — METFORMIN HCL 1000 MG PO TABS: ORAL_TABLET | ORAL | 0 refills | Status: DC

## 2016-05-06 NOTE — Addendum Note (Signed)
Addended by: Pilar Grammes on: 05/06/2016 11:16 AM   Modules accepted: Orders

## 2016-05-06 NOTE — Telephone Encounter (Signed)
Rxs sent electronically.  

## 2016-05-10 ENCOUNTER — Encounter: Payer: Self-pay | Admitting: Internal Medicine

## 2016-05-10 ENCOUNTER — Ambulatory Visit (INDEPENDENT_AMBULATORY_CARE_PROVIDER_SITE_OTHER): Payer: Medicare Other | Admitting: Internal Medicine

## 2016-05-10 VITALS — BP 138/90 | HR 79 | Ht 68.0 in | Wt 291.0 lb

## 2016-05-10 DIAGNOSIS — I259 Chronic ischemic heart disease, unspecified: Secondary | ICD-10-CM

## 2016-05-10 DIAGNOSIS — E1151 Type 2 diabetes mellitus with diabetic peripheral angiopathy without gangrene: Secondary | ICD-10-CM | POA: Diagnosis not present

## 2016-05-10 NOTE — Progress Notes (Signed)
Patient ID: [name], male   DOB: [birth year], 70 y.o.   MRN: [id number]  HPI: [name] is a 70 y.o.-year-old male, returning for DM2, dx 2004, non-insulin-dependent, uncontrolled, with complications (CAD, s/p MI). Last visit 4 mo ago.  Last hemoglobin A1c was: 01/06/2016: HbA1c calculated from fructosamine is 6.6% 09/06/2016: HbA1c calculated from fructosamine is 7.0% 06/01/2015: HbA1c calculated from fructosamine is 7.0% 02/23/2015: HbA1c calculated from fructosamine is 6.88% 10/15/2014: HbA1c calculated from fructosamine is 7.17% Lab Results  Component Value Date   HGBA1C 7.6 (H) 10/25/2015   HGBA1C 8.7 (H) 10/15/2014   HGBA1C 7.7 (H) 07/11/2014  He started to change his eating habits since 03/2013.   Pt is on a regimen of: - Glipizide 10 mg bid  - Metformin 1000 mg bid  - Januvia 100 mg daily  He does not miss doses.  Pt checks his sugars 2-3 (we reviewed his log): - am: 81-93 >> 93-112 >> 92-113 >> 143-167 >> 96-118 >> 99, 102-123 >> 102-130 >> 106-132 - 2h after breakfast: 116-159, 184 >> 109-139 >> 151-174 >> 133-153 >> 139-159 >> 129-149, 159 >> 135-154 - before lunch 144 >> 110-120s >> n/c  - before dinner: 85-172 >> 82-110 >> 91-122 >> 89-105 >> 139-183 >> 89-112 >> 90-111 >> 97-118, 127 >> 97, 107-126 - 2h after dinner: 177-193 >> 119-156 >> 107-136 >> 159-190 >> 140-167 >> 148-165 >> 129-152 >> 144-165 No lows; he has hypoglycemia awareness at mid60s.   His wife is a Radiographer, therapeutic >> he is still eating sweets occasionally.  He goes to [employer] place - 3x a week (MWF) - 1h/session, starting 8:30-9 am.  He works 2x a week. He is seeing the nutritionist at the [facility].  - no CKD, last BUN/creatinine:  Lab Results  Component Value Date   BUN 14 03/28/2016   CREATININE 1.00 03/28/2016  he is on Benicar 40. - last set of lipids: Lab Results  Component Value Date   CHOL 79 10/25/2015   HDL 47 10/25/2015   LDLCALC 16 10/25/2015   LDLDIRECT  12.8 04/02/2013   TRIG 79 10/25/2015   CHOLHDL 1.7 10/25/2015  He was in the Reveal Study x 4 years >> stopped. He is on omega 3 fatty acids. He is on Lipitor 20 mg.  - last eye exam was 07/2015 (Dr [name])  - no numbness and tingling in his feet.  He also has a history of hypothyroidism, hyperlipidemia, OSA-on CPAP, very compliant, A. Fib-on Plavix and pradaxa, IBD, GERD, vitamin B12 deficiency - previously on im B12, obesity.  Last TSH: Lab Results  Component Value Date   TSH 1.66 06/29/2015  He takes LT4 50 mcg daily.   ROS: Constitutional: no weight gain/loss, no fatigue, no subjective hyperthermia/hypothermia Eyes: no blurry vision, no xerophthalmia ENT: no sore throat, no nodules palpated in throat, no dysphagia/odynophagia, no hoarseness;  Cardiovascular: no CP/SOB/palpitations/leg swelling Respiratory: no cough/SOB Gastrointestinal: no N/V/D/C Musculoskeletal: no muscle/joint aches Skin: no rashes;   I reviewed pt's medications, allergies, PMH, social hx, family hx, and changes were documented in the history of present illness. Otherwise, unchanged from my initial visit note.  PE: BP 138/90 (BP Location: Left Arm, Patient Position: Sitting)   Pulse 79   Ht 5' 8"  (1.727 m)   Wt 291 lb (132 kg)   SpO2 96%   BMI 44.25 kg/m  Body mass index is 44.25 kg/m. Wt Readings from Last 3 Encounters:  05/10/16 291 lb (132 kg)  03/28/16 288 lb 9.6 oz (130.9 kg)  02/24/16 288 lb 9.6 oz (130.9 kg)   Constitutional: obese, protuberant abdomen, in NAD Eyes: PERRLA, EOMI, no exophthalmos ENT: moist mucous membranes, no thyromegaly, no cervical lymphadenopathy Cardiovascular: irregularly irregular, regular rate, No MRG; + bilateral leg swelling (compression hoses) Respiratory: CTA B Gastrointestinal: abdomen soft, NT, ND, BS+ Musculoskeletal: no deformities, strength intact in all 4 Skin: moist, warm, no rashes  ASSESSMENT: 1. DM2, non-insulin-dependent, uncontrolled, with  complications - CAD, s/p MI 10/2006 - s/p stent - sees [name]  PLAN:  1. Patient with long-standing DM2, with much improved control after improving diet. Sugars not changed c/w last visit, when his HbA1c (calculated from fructosamine) was 6.6%. - I suggested to continue current regimen:  Patient Instructions  Please continue: - Glipizide 10 mg 2x a day - Metformin 1000 mg 2x a day - Januvia 100 mg daily   Please stop at the lab.  Please return in 3-4 months with your sugar log.   - given more sugar logs - will check fructosamine today - UTD with eye exams - Return to clinic in 3 mo with sugar log   Office Visit on 05/10/2016  Component Date Value Ref Range Status  . Fructosamine 05/12/2016 354* 190 - 270 umol/L Final   The HbA1c calculated from the fructosamine is higher, at 7.6%.Will not change the regimen for now. We'll need to intensify the treatment if sugars higher at next visit.

## 2016-05-10 NOTE — Patient Instructions (Signed)
Please continue: - Glipizide 10 mg 2x a day - Metformin 1000 mg 2x a day - Januvia 100 mg daily   Please stop at the lab.  Please return in 3-4 months with your sugar log.

## 2016-05-11 ENCOUNTER — Other Ambulatory Visit: Payer: Self-pay

## 2016-05-11 MED ORDER — ISOSORBIDE MONONITRATE ER 60 MG PO TB24
60.0000 mg | ORAL_TABLET | Freq: Every day | ORAL | 6 refills | Status: DC
Start: 2016-05-11 — End: 2017-04-20

## 2016-05-12 ENCOUNTER — Telehealth: Payer: Self-pay | Admitting: Cardiovascular Disease

## 2016-05-12 LAB — FRUCTOSAMINE: FRUCTOSAMINE: 354 umol/L — AB (ref 190–270)

## 2016-05-12 NOTE — Telephone Encounter (Signed)
Pt c/o medication issue:  1. Name of Medication: Isosorbide 67m  2. How are you currently taking this medication (dosage and times per day)? 1x day  3. Are you having a reaction (difficulty breathing--STAT)? no  4. What is your medication issue? Is it suppose to be 60 or 31m

## 2016-05-12 NOTE — Telephone Encounter (Signed)
I spoke with Estill Bamberg and made her aware that the Imdur 76m daily is the correct dosage.

## 2016-05-19 ENCOUNTER — Ambulatory Visit: Payer: Medicare Other | Admitting: Family Medicine

## 2016-06-10 ENCOUNTER — Other Ambulatory Visit: Payer: Self-pay | Admitting: Cardiovascular Disease

## 2016-07-06 ENCOUNTER — Other Ambulatory Visit: Payer: Self-pay | Admitting: Cardiovascular Disease

## 2016-07-06 ENCOUNTER — Other Ambulatory Visit: Payer: Self-pay | Admitting: Family Medicine

## 2016-07-06 ENCOUNTER — Other Ambulatory Visit: Payer: Self-pay | Admitting: Internal Medicine

## 2016-07-20 ENCOUNTER — Ambulatory Visit (INDEPENDENT_AMBULATORY_CARE_PROVIDER_SITE_OTHER): Payer: Medicare Other | Admitting: Neurology

## 2016-07-20 ENCOUNTER — Encounter: Payer: Self-pay | Admitting: Neurology

## 2016-07-20 VITALS — BP 154/91 | HR 82 | Ht 68.0 in | Wt 295.0 lb

## 2016-07-20 DIAGNOSIS — G3184 Mild cognitive impairment, so stated: Secondary | ICD-10-CM | POA: Diagnosis not present

## 2016-07-20 DIAGNOSIS — I259 Chronic ischemic heart disease, unspecified: Secondary | ICD-10-CM | POA: Diagnosis not present

## 2016-07-20 NOTE — Patient Instructions (Signed)
I had a long discussion with the patient regarding his memory loss and mild cognitive impairment which appears to be stable. Encouraged him to continue participating in cognitively challenging activities like solving crossword puzzles, sudoku and playing bridge. Continue Pradaxa for stroke prevention with strict control of diabetes with hemoglobin A1c goal below 6.5, lipids with LDL cholesterol goal below 70 mg percent, hypertension with blood pressure goal below 130/90. I also encouraged him to eat healthy with lots of fruits, vegetables cereals and whole gr and 2 exercise regularly and lose weight. He will return for follow-up in a year or call earlier if necessary.

## 2016-07-20 NOTE — Progress Notes (Signed)
[facility] [address] [place]. [place] [address] [phone]       OFFICE FOLLOW-UP NOTE  [name] Date of Birth:  Jul 03, 1946 Medical Record Number:  [id number]   HPI:70 year Caucasian male seen today for first office f/u visit after [facility] admission for TIA in January 2017. He is accompanied by his granddaughter. [name] is an 70 y.o. male with a past medical history significant for HTN, HLD, CAD, MI, ischemic cardiomyopathy, atrial fibrillation on pradaxa, OSA on CPAP, transferred to [facility] for TIA work up. Wife is at the bedside and stated that 2 days ago they were home watching TV when she noted that her husband was having trouble operating the remote control. She said that he was having a great deal of difficulty putting the channel that he wanted to watch and was " obviously confused, saying I can not make it to work properly". Wife also said that his speech was slurred and he was hesitant getting his words out. His grandson was home and he called EMS. By the time they arrive to [facility] the symptoms had subsided, and he estimate that the entire episode lasted about 2 hours off an on. He developed severe pain around the left eye and vomiting in the ED but denies vertigo, double vision, focal weakness or numbness, or visual disturbances. CT head while in the ED showed no acute abnormality and he was subsequently transferred to [facility] where he had MRI/MRA brain that showed no acute abnormality.  Total cholesterol 79, LDL 16, HDL 47, triglycerides 79.  TTE: EF 35-40%, no mural thrombus, left atrium mildly enlarged. He was last known well 02/25/83, time uncertain. Patient was not administered TPA secondary to symptoms resolved. The patient informs me that he may not have taken his Pradaxa regularly for a pre-few days prior to that episode. He still has done well since then and has not had any recurrent TIA or stroke symptoms. He is tolerating Pradaxa well without  bleeding or bruising. States her blood pressure is well controlled and today it is 134/53. Fasting sugars have been well. His last hemoglobin A1c was 7.63 months ago. He has been using his CPAP mask regularly for his sleep apnea. The patient is concerned about short-term memory difficulties that he has been having. He has not had any formal testing or treatment for this. Update 07/20/2016 : He returns for follow-up after last visit 6 months ago. He continues to do well and states is at no recurrent TIA stroke symptoms. He also states his memory difficulties appeared to be unchanged. He did not notice any significant progressive cognitive impairment. He does salt sudoku and crossword puzzles. He is tolerating Plavix well without significant bruising or bleeding. He states her blood pressure is well controlled though it is slightly elevated today at 154/91. He is tolerating Lipitor well without muscle aches and pains. He states his fasting sugars have all been well controlled. ROS:   14 system review of systems is positive for  memory difficulties,  shortness of breath, easy bruising, easy bleeding, skin moles, apnea and all other systems negative  PMH:  Past Medical History:  Diagnosis Date  . Atrial fibrillation ([place])    initial diagnoses 2012  . B12 deficiency    per patient previously taking shots  . CORONARY ARTERY DISEASE 04/12/2007   2 stents last in 2006.   Marland Kitchen DIABETES MELLITUS, TYPE II 04/16/2007  . Diverticulosis of colon (without mention of hemorrhage)   .  HYPERLIPIDEMIA 04/16/2007   reveal study. not sure of medication  . HYPERTENSION 04/12/2007  . HYPOTHYROIDISM 04/12/2007  . Ischemic cardiomyopathy    cath 35-40%  . MYOCARDIAL INFARCTION, HX OF 04/12/2007  . OBESITY 06/16/2009  . SLEEP APNEA 04/12/2007   CPAP  . Stroke ([place])   . ULCERATIVE COLITIS, LEFT SIDED 11/23/2010    Social History:  Social History   Social History  . Marital status: Married    Spouse name: N/A  . Number of  children: 1  . Years of education: N/A   Occupational History  . RETIRED Retired   Social History Main Topics  . Smoking status: Former Smoker    Packs/day: 1.00    Years: 5.00    Types: Cigarettes    Quit date: 04/13/1966  . Smokeless tobacco: Never Used  . Alcohol use No     Comment: no more beer  . Drug use: No  . Sexual activity: Not on file   Other Topics Concern  . Not on file   Social History Narrative   Cardiorehab 3 days a week. 45 minutes to an hour-stationary bike.    GRANDDAUGHTER (MS. 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       Married for 37 years in 2015, married previously for 14 years. Daughter with first wife and 3 grandkids.    Lives alone with wife. Get to see grandkids a lot. New grandchild in middle of 42      Hobbies-yardwork, previously liked to hunt and fish, does some target shooting    Medications:   Current Outpatient Prescriptions on File Prior to Visit  Medication Sig Dispense Refill  . atorvastatin (LIPITOR) 20 MG tablet Take 1 tablet (20 mg total) by mouth daily. 90 tablet 3  . BENICAR 40 MG tablet TAKE ONE (1) TABLET EACH DAY 30 tablet 5  . carvedilol (COREG) 25 MG tablet Take 1 tablet (25 mg total) by mouth 2 (two) times daily with a meal. 60 tablet 7  . clopidogrel (PLAVIX) 75 MG tablet Take 75 mg by mouth daily.    . diclofenac sodium (VOLTAREN) 1 % GEL Apply 2 g topically 4 (four) times daily as needed (knee pain).    . fish oil-omega-3 fatty acids 1000 MG capsule Take 2 g by mouth daily.     . furosemide (LASIX) 40 MG tablet Take 40 mg by mouth daily.    Marland Kitchen glipiZIDE (GLUCOTROL) 10 MG tablet Take 1 tablet (10 mg total) by mouth 2 (two) times daily. 180 tablet 0  . glucosamine-chondroitin 500-400 MG tablet Take 1 tablet by mouth daily as needed (knee flare up or pain).    . isosorbide mononitrate (IMDUR) 60 MG 24 hr tablet Take 1  tablet (60 mg total) by mouth daily. 30 tablet 6  . JANUVIA 100 MG tablet TAKE ONE (1) TABLET EACH DAY 90 tablet 3  . levothyroxine (SYNTHROID, LEVOTHROID) 50 MCG tablet Take 50 mcg by mouth daily before breakfast.    . mesalamine (APRISO) 0.375 g 24 hr capsule TAKE 4 CAPSULES ONCE DAILY 120 capsule 7  . metFORMIN (GLUCOPHAGE) 1000 MG tablet TAKE ONE TABLET TWICE DAILY WITH A MEAL 180 tablet 0  . potassium chloride SA (K-DUR,KLOR-CON) 20 MEQ tablet Take 20 mEq by mouth daily.    Marland Kitchen PRADAXA 150 MG CAPS capsule TAKE ONE TABLET BY MOUTH TWICE  A DAY 60 capsule 8  . spironolactone (ALDACTONE) 25 MG tablet Take 0.5 tablets (12.5 mg total) by mouth daily. 45 tablet 3  . triamcinolone cream (KENALOG) 0.1 % Apply 1 application topically 2 (two) times daily. (Patient taking differently: Apply 1 application topically 2 (two) times daily as needed. ) 30 g 0   No current facility-administered medications on file prior to visit.     Allergies:  No Known Allergies  Physical Exam General: Obese elderly Caucasian male, seated, in no evident distress Head: head normocephalic and atraumatic.  Neck: supple with no carotid or supraclavicular bruits Cardiovascular: regular rate and rhythm, no murmurs Musculoskeletal: no deformity Skin:  no rash/petichiae Vascular:  Normal pulses all extremities Vitals:   07/20/16 1115  BP: (!) 154/91  Pulse: 82   Neurologic Exam Mental Status: Awake and fully alert. Oriented to place and time. Recent and remote memory intact. Attention span, concentration and fund of knowledge appropriate. Mood and affect appropriate. Recall 3/3. Animal naming test 12 Cranial Nerves: Fundoscopic exam reveals sharp disc margins. Pupils equal, briskly reactive to light. Extraocular movements full without nystagmus. Visual fields full to confrontation. Hearing intact. Facial sensation intact. Face, tongue, palate moves normally and symmetrically.  Motor: Normal bulk and tone. Normal strength in  all tested extremity muscles. Sensory.: intact to touch ,pinprick .position and vibratory sensation.  Coordination: Rapid alternating movements normal in all extremities. Finger-to-nose and heel-to-shin performed accurately bilaterally. Gait and Station: Arises from chair without difficulty. Stance is normal. Gait demonstrates normal stride length and balance . Able to heel, toe and tandem walk without difficulty.  Reflexes: 1+ and symmetric. Toes downgoing.      ASSESSMENT: 19 year Caucasian male with TIA in January 2017 with multiple vascular risk factors of atrial fibrillation, diabetes, hypertension, hyperlipidemia, obesity, sleep apnea, coronary artery disease.Mild memory difficulties due to mild cognitive impairment.    PLAN: I had a long discussion with the patient regarding his memory loss and mild cognitive impairment which appears to be stable. Encouraged him to continue participating in cognitively challenging activities like solving crossword puzzles, sudoku and playing bridge. Continue Pradaxa for stroke prevention with strict control of diabetes with hemoglobin A1c goal below 6.5, lipids with LDL cholesterol goal below 70 mg percent, hypertension with blood pressure goal below 130/90. I also encouraged him to eat healthy with lots of fruits, vegetables cereals and whole gr and 2 exercise regularly and lose weight. He will return for follow-up in a year or call earlier if necessary. Antony Contras, MD Note: This document was prepared with digital dictation and possible smart phrase technology. Any transcriptional errors that result from this process are unintentional

## 2016-07-27 ENCOUNTER — Ambulatory Visit: Payer: Medicare Other | Admitting: Family Medicine

## 2016-07-30 ENCOUNTER — Other Ambulatory Visit: Payer: Self-pay | Admitting: Internal Medicine

## 2016-08-04 ENCOUNTER — Other Ambulatory Visit: Payer: Self-pay | Admitting: Cardiovascular Disease

## 2016-08-04 ENCOUNTER — Other Ambulatory Visit: Payer: Self-pay | Admitting: Internal Medicine

## 2016-08-04 DIAGNOSIS — E785 Hyperlipidemia, unspecified: Secondary | ICD-10-CM

## 2016-08-10 ENCOUNTER — Other Ambulatory Visit: Payer: Self-pay | Admitting: Cardiovascular Disease

## 2016-08-10 ENCOUNTER — Ambulatory Visit (INDEPENDENT_AMBULATORY_CARE_PROVIDER_SITE_OTHER): Payer: Medicare Other | Admitting: Internal Medicine

## 2016-08-10 ENCOUNTER — Encounter: Payer: Self-pay | Admitting: Internal Medicine

## 2016-08-10 VITALS — BP 142/92 | HR 92 | Ht 68.0 in | Wt 288.0 lb

## 2016-08-10 DIAGNOSIS — Z23 Encounter for immunization: Secondary | ICD-10-CM

## 2016-08-10 DIAGNOSIS — E1151 Type 2 diabetes mellitus with diabetic peripheral angiopathy without gangrene: Secondary | ICD-10-CM

## 2016-08-10 DIAGNOSIS — I259 Chronic ischemic heart disease, unspecified: Secondary | ICD-10-CM | POA: Diagnosis not present

## 2016-08-10 MED ORDER — EMPAGLIFLOZIN 10 MG PO TABS: 10.0000 mg | ORAL_TABLET | Freq: Every day | ORAL | 5 refills | Status: DC

## 2016-08-10 NOTE — Progress Notes (Signed)
Patient ID: [name], male   DOB: 1945-11-27, 70 y.o.   MRN: [id number]  HPI: [name] is a 70 y.o.-year-old male, returning for DM2, dx 2004, non-insulin-dependent, uncontrolled, with complications (CAD, s/p MI). Last visit 3 mo ago. He has [facility] as supplemental and will change to [employer] for meds in 10/2015.  Last hemoglobin A1c was: 05/10/2016: HbA1c calculated from fructosamine is 7.6% 01/06/2016: HbA1c calculated from fructosamine is 6.6% 09/06/2016: HbA1c calculated from fructosamine is 7.0% 06/01/2015: HbA1c calculated from fructosamine is 7.0% 02/23/2015: HbA1c calculated from fructosamine is 6.88% 10/15/2014: HbA1c calculated from fructosamine is 7.17% Lab Results  Component Value Date   HGBA1C 7.6 (H) 10/25/2015   HGBA1C 8.7 (H) 10/15/2014   HGBA1C 7.7 (H) 07/11/2014  He started to change his eating habits since 03/2013.   Pt is on a regimen of: - Glipizide 10 mg bid  - Metformin 1000 mg bid  - Januvia 100 mg daily  He does not miss doses.  Pt checks his sugars 4x a day (we reviewed his log): - am: 93-112 >> 92-113 >> 143-167 >> 96-118 >> 99, 102-123 >> 102-130 >> 106-132 >> 113-132 - 2h after breakfast: 151-174 >> 133-153 >> 139-159 >> 129-149, 159 >> 135-154 >> 139-164 - before lunch 144 >> 110-120s >> n/c  - before dinner: 89-105 >> 139-183 >> 89-112 >> 90-111 >> 97-118, 127 >> 97, 107-126 >> 95-110, 121 - 2h after dinner:107-136 >> 159-190 >> 140-167 >> 148-165 >> 129-152 >> 144-165 >> 143-175 No lows; he has hypoglycemia awareness at mid60s.   His wife is a Radiographer, therapeutic >> he is still eating sweets occasionally.  He goes to [employer] place - 3x a week (MWF) - 1h/session, starting 8:30-9 am.  He works 2x a week. He is seeing the nutritionist at the Cardiac Rehab.  - no CKD, last BUN/creatinine:  Lab Results  Component Value Date   BUN 14 03/28/2016   CREATININE 1.00 03/28/2016  he is on Benicar 40. - last set of lipids: Lab Results   Component Value Date   CHOL 79 10/25/2015   HDL 47 10/25/2015   LDLCALC 16 10/25/2015   LDLDIRECT 12.8 04/02/2013   TRIG 79 10/25/2015   CHOLHDL 1.7 10/25/2015  He was in the Reveal Study x 4 years >> stopped. He is on omega 3 fatty acids. He is on Lipitor 20 mg.  - last eye exam was 07/2015 (Dr [name])  - no numbness and tingling in his feet.  He also has a history of hypothyroidism, hyperlipidemia, OSA-on CPAP, very compliant, A. Fib-on Plavix and pradaxa, IBD, GERD, vitamin B12 deficiency - previously on im B12, obesity.  Last TSH: Lab Results  Component Value Date   TSH 1.66 06/29/2015  He takes LT4 50 mcg daily.   ROS: Constitutional:+ weight loss, no fatigue, no subjective hyperthermia/hypothermia Eyes: no blurry vision, no xerophthalmia ENT: no sore throat, no nodules palpated in throat, no dysphagia/odynophagia, no hoarseness;  Cardiovascular: + mild CP (chronic)/+ SOB/no palpitations/leg swelling Respiratory: no cough/+ SOB Gastrointestinal: no N/V/D/C Musculoskeletal: no muscle/joint aches Skin: no rashes;   I reviewed pt's medications, allergies, PMH, social hx, family hx, and changes were documented in the history of present illness. Otherwise, unchanged from my initial visit note.  PE: BP (!) 142/92 (BP Location: Left Arm, Patient Position: Sitting)   Pulse 92   Ht 5' 8"  (1.727 m)   Wt 288 lb (130.6 kg)   SpO2 98%   BMI 43.79 kg/m  Body mass index is 43.79 kg/m. Wt Readings from Last 3 Encounters:  08/10/16 288 lb (130.6 kg)  07/20/16 295 lb (133.8 kg)  05/10/16 291 lb (132 kg)   Constitutional: obese, protuberant abdomen, in NAD Eyes: PERRLA, EOMI, no exophthalmos ENT: moist mucous membranes, no thyromegaly, no cervical lymphadenopathy Cardiovascular: irregularly irregular, regular rate, No MRG; + bilateral leg swelling (compression hoses) Respiratory: CTA B Gastrointestinal: abdomen soft, NT, ND, BS+ Musculoskeletal: no deformities, strength  intact in all 4 Skin: moist, warm, no rashes  ASSESSMENT: 1. DM2, non-insulin-dependent, uncontrolled, with complications - CAD, s/p MI 10/2006 - s/p stent - sees Dr. Burt [name]  PLAN:  1. Patient with long-standing DM2, with slightly higher postprandial sugars at this visit. Last HbA1c (calculated from fructosamine) was 7.6%.  - I suggested to continue current regimen, but add the lower dose of Jardiance to help with postprandial sugars, especially with the Holidays coming up. Will not decrease Furosemide as his BP was higher today.  - we discussed about SEs of Jardiance >> advised to stay hydrated - I advised him to Patient Instructions  Please continue: - Glipizide 10 mg 2x a day - Metformin 1000 mg 2x a day - Januvia 100 mg daily   Please add: - Jardiance 10 mg daily in am  Please stop at the lab.  Please return in 2 months with your sugar log.   - will check fructosamine today - UTD with eye exams - Return to clinic in 2 mo with sugar log   Component     Latest Ref Rng & Units 08/10/2016  Fructosamine     190 - 270 umol/L 369 (H)  HbA1c calculated from the fructosamine is higher than before, at 7.88%. Please see plan above to add Jardiance.  Andrew Kingdom, MD PhD [facility]

## 2016-08-10 NOTE — Patient Instructions (Addendum)
Please continue: - Glipizide 10 mg 2x a day - Metformin 1000 mg 2x a day - Januvia 100 mg daily   Please add: - Jardiance 10 mg daily in am  Please stop at the lab.  Please return in 2 months with your sugar log.

## 2016-08-11 ENCOUNTER — Encounter: Payer: Self-pay | Admitting: Family Medicine

## 2016-08-11 ENCOUNTER — Ambulatory Visit (INDEPENDENT_AMBULATORY_CARE_PROVIDER_SITE_OTHER): Payer: Medicare Other | Admitting: Family Medicine

## 2016-08-11 VITALS — BP 134/82 | HR 80 | Temp 98.1°F | Ht 67.25 in | Wt 287.2 lb

## 2016-08-11 DIAGNOSIS — I482 Chronic atrial fibrillation, unspecified: Secondary | ICD-10-CM

## 2016-08-11 DIAGNOSIS — I25118 Atherosclerotic heart disease of native coronary artery with other forms of angina pectoris: Secondary | ICD-10-CM | POA: Diagnosis not present

## 2016-08-11 DIAGNOSIS — E785 Hyperlipidemia, unspecified: Secondary | ICD-10-CM | POA: Diagnosis not present

## 2016-08-11 DIAGNOSIS — E034 Atrophy of thyroid (acquired): Secondary | ICD-10-CM

## 2016-08-11 DIAGNOSIS — I10 Essential (primary) hypertension: Secondary | ICD-10-CM | POA: Diagnosis not present

## 2016-08-11 DIAGNOSIS — Z0000 Encounter for general adult medical examination without abnormal findings: Secondary | ICD-10-CM

## 2016-08-11 DIAGNOSIS — I5022 Chronic systolic (congestive) heart failure: Secondary | ICD-10-CM | POA: Diagnosis not present

## 2016-08-11 DIAGNOSIS — I259 Chronic ischemic heart disease, unspecified: Secondary | ICD-10-CM | POA: Diagnosis not present

## 2016-08-11 DIAGNOSIS — Z87891 Personal history of nicotine dependence: Secondary | ICD-10-CM

## 2016-08-11 LAB — LDL CHOLESTEROL, DIRECT: Direct LDL: 12 mg/dL

## 2016-08-11 NOTE — Assessment & Plan Note (Addendum)
S: No rate control agent but rate controlled despite chronic a fib. pradaxa for anticoagulation A/P: continue current medications

## 2016-08-11 NOTE — Assessment & Plan Note (Addendum)
S: stable edema on Spironolactone 12.83m, lasix 473m No recent weight gain, no shortness of breath with his normal activities- only if really pushes himself A/P: continue current medictions. Discussed challenging balance of maintaining hydration on jardiance while not becoming fluid overloaded as well.

## 2016-08-11 NOTE — Assessment & Plan Note (Signed)
S: discussed importance of weight loss. He has lost a few lbs. Doing cardiac rehab 3x a week for exercise- really splurged in September and has cut back Wt Readings from Last 3 Encounters:  08/11/16 287 lb 3.2 oz (130.3 kg)  08/10/16 288 lb (130.6 kg)  07/20/16 295 lb (133.8 kg)  A/P: discussed goal 5-10 lbs weight loss by follow up

## 2016-08-11 NOTE — Progress Notes (Signed)
Pre visit review using our clinic review tool, if applicable. No additional management support is needed unless otherwise documented below in the visit note. 

## 2016-08-11 NOTE — Assessment & Plan Note (Signed)
2 pack years quit 1967. [place] ordered 08/11/16 for AAA screen

## 2016-08-11 NOTE — Assessment & Plan Note (Addendum)
S: has been stable on 50 mcg synthroid A/P: update tsh today, doing well. Addendum- reviewed labs- I had ordered TSH- unclear why this got cancelled- can check at next visit

## 2016-08-11 NOTE — Assessment & Plan Note (Signed)
S: excellently controlled on atorvastatin 21m. No myalgias.  Lab Results  Component Value Date   CHOL 79 10/25/2015   HDL 47 10/25/2015   LDLCALC 16 10/25/2015   LDLDIRECT 12.8 04/02/2013   TRIG 79 10/25/2015   CHOLHDL 1.7 10/25/2015   A/P: update direct LDL today. Doing well

## 2016-08-11 NOTE — Progress Notes (Signed)
Phone: [phone]  Subjective:  Patient presents today for their annual wellness visit.    Preventive Screening-Counseling & Management  Smoking Status: former Smoker Second Hand Smoking status: No smokers in home  Risk Factors Regular exercise: cardiac rehab 3 days a week for 60 minutes.  Diet: stressed importance of weight loss- morbid obesity noted . States gained 5 lbs in September then eating halloween candy.  Fall Risk: None  Fall Risk  08/11/2016 07/20/2016 01/19/2016 02/09/2015 04/02/2013  Falls in the past year? No No No No No    Cardiac risk factors:  advanced age (older than 79 for men, 73 for women)  Known CAD- follows with cardiology Hyperlipidemia - controlled with lastLDL 16 Diabetes followed by Dr. Cruzita Lederer Hypertension controlled  Depression Screen None. PHQ2 0  Depression screen [facility] 2/9 08/11/2016 02/09/2015  Decreased Interest 0 0  Down, Depressed, Hopeless 0 0  PHQ - 2 Score 0 0  Some recent data might be hidden    Activities of Daily Living Independent ADLs and IADLs   Hearing Difficulties: -patient endorses issues but declines desire for evaluation  Cognitive Testing No reported trouble.    Normal 3 word recall  List the Names of Other Physician/Practitioners you currently use: -Dr. Fuller [facility] -[name] with cardiology - Dr. Elsworth Soho for cpap - Dr. Cruzita Lederer endocrine  Hospitalized in January 2017 for TIA  Immunization History  Administered Date(s) Administered  . Influenza Split 07/06/2011, 06/25/2012  . Influenza Whole 07/14/2008, 08/03/2009, 08/24/2010  . Influenza, High Dose Seasonal PF 08/10/2016  . Influenza,inj,Quad PF,36+ Mos 06/03/2013, 07/11/2014, 06/29/2015  . Pneumococcal Conjugate-13 02/09/2015  . Pneumococcal Polysaccharide-23 07/06/2011  . Td 09/22/2010  . Zoster 07/19/2011   Required Immunizations needed today up to date as got flu shot yesterday  Screening tests- up to date Health Maintenance Due  Topic Date Due   . OPHTHALMOLOGY EXAM - October 2016, some issues going on at home with wife- he will schedule when this calms down 07/17/2015  . FOOT EXAM - normal today 08/12/2015  . HEMOGLOBIN A1C - followed by endocrine- gets fructosamine instead 04/23/2016    ROS- No pertinent positives discovered in course of AWV  The following were reviewed and entered/updated in epic: Past Medical History:  Diagnosis Date  . Atrial fibrillation ([place])    initial diagnoses 2012  . B12 deficiency    per patient previously taking shots  . CORONARY ARTERY DISEASE 04/12/2007   2 stents last in 2006.   Marland Kitchen DIABETES MELLITUS, TYPE II 04/16/2007  . Diverticulosis of colon (without mention of hemorrhage)   . HYPERLIPIDEMIA 04/16/2007   reveal study. not sure of medication  . HYPERTENSION 04/12/2007  . HYPOTHYROIDISM 04/12/2007  . Ischemic cardiomyopathy    cath 35-40%  . MYOCARDIAL INFARCTION, HX OF 04/12/2007  . OBESITY 06/16/2009  . SLEEP APNEA 04/12/2007   CPAP  . Stroke ([place])   . ULCERATIVE COLITIS, LEFT SIDED 11/23/2010   Patient Active Problem List   Diagnosis Date Noted  . History of transient ischemic attack (TIA) 10/24/2015    Priority: High  . CHF (congestive heart failure), NYHA class II ([place]) 05/09/2014    Priority: High  . IBD (inflammatory bowel disease) 08/09/2011    Priority: High  . Atrial fibrillation ([place]) 12/08/2010    Priority: High  . Type II diabetes mellitus with peripheral circulatory disorder ([place]) 04/16/2007    Priority: High  . CAD (coronary artery disease), native coronary artery 04/12/2007    Priority: High  . Morbid  obesity ([place]) 08/09/2011    Priority: Medium  . Hyperlipidemia 04/16/2007    Priority: Medium  . Hypothyroidism 04/12/2007    Priority: Medium  . Essential hypertension 04/12/2007    Priority: Medium  . OSA (obstructive sleep apnea) 04/12/2007    Priority: Medium  . Former smoker 08/11/2016    Priority: Low  . MCI (mild cognitive impairment) 01/19/2016    Priority: Low   . Osteoarthritis of left knee 02/09/2015    Priority: Low  . Allergic rhinitis 06/25/2012    Priority: Low  . GERD (gastroesophageal reflux disease) 08/09/2011    Priority: Low  . VITAMIN B12 DEFICIENCY 11/29/2010    Priority: Low   Past Surgical History:  Procedure Laterality Date  . CARDIAC CATHETERIZATION  10/2002   STENT. 2 stents Dr. Maurene Capes  . CARDIAC CATHETERIZATION N/A 10/28/2015   Procedure: Right/Left Heart Cath and Coronary Angiography;  Surgeon: Sherren Mocha, MD;  Location: [place] [facility];  Service: Cardiovascular;  Laterality: N/A;  . CORONARY STENT PLACEMENT  2008   LAD   . TONSILLECTOMY      Family History  Problem Relation Age of Onset  . Heart attack Mother     mid 19s  . Heart disease Father     H/O CAD, CABG, VALVE SURGERY  . Hypertension Brother   . Obesity Brother   . Stomach cancer Maternal Aunt   . Stomach cancer Paternal Grandmother     ? colon     Medications- reviewed and updated Current Outpatient Prescriptions  Medication Sig Dispense Refill  . atorvastatin (LIPITOR) 20 MG tablet Take 1 tablet (20 mg total) by mouth daily. 90 tablet 1  . BENICAR 40 MG tablet TAKE ONE (1) TABLET EACH DAY 30 tablet 5  . carvedilol (COREG) 25 MG tablet Take 1 tablet (25 mg total) by mouth 2 (two) times daily with a meal. 60 tablet 7  . clopidogrel (PLAVIX) 75 MG tablet Take 75 mg by mouth daily.    . diclofenac sodium (VOLTAREN) 1 % GEL Apply 2 g topically 4 (four) times daily as needed (knee pain).    Marland Kitchen empagliflozin (JARDIANCE) 10 MG TABS tablet Take 10 mg by mouth daily. 30 tablet 5  . fish oil-omega-3 fatty acids 1000 MG capsule Take 2 g by mouth daily.     . furosemide (LASIX) 40 MG tablet Take 40 mg by mouth daily.    Marland Kitchen glipiZIDE (GLUCOTROL) 10 MG tablet TAKE ONE TABLET BY MOUTH TWICE A DAY 180 tablet 0  . glucosamine-chondroitin 500-400 MG tablet Take 1 tablet by mouth daily as needed (knee flare up or pain).    . isosorbide mononitrate (IMDUR) 60 MG  24 hr tablet Take 1 tablet (60 mg total) by mouth daily. 30 tablet 6  . JANUVIA 100 MG tablet TAKE ONE (1) TABLET EACH DAY 90 tablet 3  . levothyroxine (SYNTHROID, LEVOTHROID) 50 MCG tablet Take 50 mcg by mouth daily before breakfast.    . mesalamine (APRISO) 0.375 g 24 hr capsule TAKE 4 CAPSULES ONCE DAILY 120 capsule 7  . metFORMIN (GLUCOPHAGE) 1000 MG tablet TAKE ONE TABLET TWICE DAILY WITH A MEAL 180 tablet 1  . potassium chloride SA (K-DUR,KLOR-CON) 20 MEQ tablet Take 20 mEq by mouth daily.    Marland Kitchen PRADAXA 150 MG CAPS capsule TAKE ONE TABLET BY MOUTH TWICE A DAY 60 capsule 8  . spironolactone (ALDACTONE) 25 MG tablet TAKE 1/2 TABLET BY MOUTH DAILY. 45 tablet 1  . triamcinolone cream (KENALOG) 0.1 %  Apply 1 application topically 2 (two) times daily. (Patient taking differently: Apply 1 application topically 2 (two) times daily as needed. ) 30 g 0   No current facility-administered medications for this visit.     Allergies-reviewed and updated No Known Allergies  Social History   Social History  . Marital status: Married    Spouse name: N/A  . Number of children: 1  . Years of education: N/A   Occupational History  . RETIRED Retired   Social History Main Topics  . Smoking status: Former Smoker    Packs/day: 1.00    Years: 5.00    Types: Cigarettes    Quit date: 04/13/1966  . Smokeless tobacco: Never Used  . Alcohol use No     Comment: no more beer  . Drug use: No  . Sexual activity: Not Asked   Other Topics Concern  . None   Social History Narrative   Cardiorehab 3 days a week. 45 minutes to an hour-stationary bike.    GRANDDAUGHTER (MS. 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       Married for 37 years in 2015, married previously for 14 years. Daughter with first wife and 3 grandkids.    Lives alone with wife. Get to see grandkids a lot. New grandchild in middle of 50       Hobbies-yardwork, previously liked to hunt and fish, does some target shooting    Objective: BP 134/82 (BP Location: Left Arm, Patient Position: Sitting, Cuff Size: Large)   Pulse 80   Temp 98.1 F (36.7 C) (Oral)   Ht 5' 7.25" (1.708 m)   Wt 287 lb 3.2 oz (130.3 kg)   SpO2 96%   BMI 44.65 kg/m  Gen: NAD, resting comfortably HEENT: Mucous membranes are moist. Oropharynx normal Neck: no thyromegaly CV: irregularly irregular no murmurs rubs or gallops Lungs: CTAB no crackles, wheeze, rhonchi Abdomen: soft/nontender/nondistended/normal bowel sounds. No rebound or guarding.  Ext: no edema under compression stockings Skin: warm, dry Neuro: grossly normal, moves all extremities, PERRLA  Diabetic Foot Exam - Simple   Simple Foot Form Diabetic Foot exam was performed with the following findings:  Yes 08/11/2016  1:38 PM  Visual Inspection No deformities, no ulcerations, no other skin breakdown bilaterally:  Yes Sensation Testing Intact to touch and monofilament testing bilaterally:  Yes Pulse Check Posterior Tibialis and Dorsalis pulse intact bilaterally:  Yes Comments     Assessment/Plan:  AWV completed- discussed recommended screenings anddocumented any personalized health advice and referrals for preventive counseling. See AVS as well which was given to patient.   Status of chronic or acute concerns   Hypothyroidism S: has been stable on 50 mcg synthroid A/P: update tsh today, doing well. Addendum- reviewed labs- I had ordered TSH- unclear why this got cancelled- can check at next visit  Hyperlipidemia S: excellently controlled on atorvastatin 58m. No myalgias.  Lab Results  Component Value Date   CHOL 79 10/25/2015   HDL 47 10/25/2015   LDLCALC 16 10/25/2015   LDLDIRECT 12.8 04/02/2013   TRIG 79 10/25/2015   CHOLHDL 1.7 10/25/2015   A/P: update direct LDL today. Doing well   Former smoker 2 pack years quit 1967. [place] 08[place]16 for AAA  screen  Atrial fibrillation (HCC) S: No rate control agent but rate controlled despite chronic a fib. pradaxa for anticoagulation A/P:  continue current medications   CHF (congestive heart failure), NYHA class II (HCC) S: stable edema on Spironolactone 12.41m, lasix 422m No recent weight gain, no shortness of breath with his normal activities- only if really pushes himself A/P: continue current medictions. Discussed challenging balance of maintaining hydration on jardiance while not becoming fluid overloaded as well.   CAD (coronary artery disease), native coronary artery S: history of stents. Compliant with plavix (on this in addition to the pradaxa). Imdur has done well in preventing any angina- has nitroglycerin but not using A/P: continue current medications- encouraged cardiology follow up  Essential hypertension S: controlled on Amlodipine 1083mbenicar 19m2moreg 25mg28m + 12.5 at lunch, imdur 60mg 66m continue current medicatoina, no change  Morbid obesity (HCC) [place]iscussed importance of weight loss. He has lost a few lbs. Doing cardiac rehab 3x a week for exercise- really splurged in September and has cut back Wt Readings from Last 3 Encounters:  08/11/16 287 lb 3.2 oz (130.3 kg)  08/10/16 288 lb (130.6 kg)  07/20/16 295 lb (133.8 kg)  A/P: discussed goal 5-10 lbs weight loss by follow up  6 months  Orders Placed This Encounter  Procedures  . LDL cholesterol, direct    [place]   Return precautions advised.  StepheGarret Reddish

## 2016-08-11 NOTE — Patient Instructions (Addendum)
  Mr. Necaise , Thank you for taking time to come for your Medicare Wellness Visit. I appreciate your ongoing commitment to your health goals. Please review the following plan we discussed and let me know if I can assist you in the future.   These are the goals we discussed: 1. Get your eye exam scheduled  2. Labs before you leave 3. See me back in 6 months- would love to have you lose at least 5-10 lbs between now and follow up. The jardiance from Dr. Cruzita Lederer may actually help. You have lost 8 lbs over last month but want to push even lower.  Wt Readings from Last 3 Encounters:  08/11/16 287 lb 3.2 oz (130.3 kg)  08/10/16 288 lb (130.6 kg)  07/20/16 295 lb (133.8 kg)   This is a list of the screening recommended for you and due dates:  Health Maintenance  Topic Date Due  . Eye exam for diabetics  07/17/2015  . Complete foot exam   08/12/2015  . Hemoglobin A1C  04/23/2016  .  Hepatitis C: One time screening is recommended by Center for Disease Control  (CDC) for  adults born from [birth year] through 1965.   10/14/2098*  . Tetanus Vaccine  09/22/2020  . Colon Cancer Screening  12/17/2020  . Flu Shot  Completed  . Shingles Vaccine  Completed  . Pneumonia vaccines  Completed  *Topic was postponed. The date shown is not the original due date.

## 2016-08-11 NOTE — Assessment & Plan Note (Signed)
S: history of stents. Compliant with plavix (on this in addition to the pradaxa). Imdur has done well in preventing any angina- has nitroglycerin but not using A/P: continue current medications- encouraged cardiology follow up

## 2016-08-11 NOTE — Assessment & Plan Note (Signed)
S: controlled on Amlodipine 68m, benicar 447m coreg 252mID + 12.5 at lunch, imdur 82m63mP: continue current medicatoina, no change

## 2016-08-12 ENCOUNTER — Other Ambulatory Visit: Payer: Self-pay | Admitting: Internal Medicine

## 2016-08-12 LAB — FRUCTOSAMINE: FRUCTOSAMINE: 369 umol/L — AB (ref 190–270)

## 2016-08-16 ENCOUNTER — Inpatient Hospital Stay (HOSPITAL_COMMUNITY): Admission: RE | Admit: 2016-08-16 | Payer: Medicare Other | Source: Ambulatory Visit

## 2016-09-07 ENCOUNTER — Ambulatory Visit (HOSPITAL_COMMUNITY)
Admission: RE | Admit: 2016-09-07 | Discharge: 2016-09-07 | Disposition: A | Discharge status: Home / Self Care | Payer: Medicare Other | Source: Ambulatory Visit | Attending: Cardiovascular Disease | Admitting: Cardiovascular Disease

## 2016-09-07 DIAGNOSIS — Z87891 Personal history of nicotine dependence: Secondary | ICD-10-CM

## 2016-09-07 DIAGNOSIS — Z8249 Family history of ischemic heart disease and other diseases of the circulatory system: Secondary | ICD-10-CM | POA: Diagnosis not present

## 2016-09-07 DIAGNOSIS — I2510 Atherosclerotic heart disease of native coronary artery without angina pectoris: Secondary | ICD-10-CM | POA: Diagnosis not present

## 2016-09-07 DIAGNOSIS — Z136 Encounter for screening for cardiovascular disorders: Secondary | ICD-10-CM | POA: Insufficient documentation

## 2016-09-07 DIAGNOSIS — I10 Essential (primary) hypertension: Secondary | ICD-10-CM | POA: Diagnosis not present

## 2016-09-07 DIAGNOSIS — E119 Type 2 diabetes mellitus without complications: Secondary | ICD-10-CM | POA: Diagnosis not present

## 2016-09-07 DIAGNOSIS — E785 Hyperlipidemia, unspecified: Secondary | ICD-10-CM | POA: Diagnosis not present

## 2016-09-08 ENCOUNTER — Telehealth: Payer: Self-pay | Admitting: Cardiovascular Disease

## 2016-09-08 DIAGNOSIS — I5022 Chronic systolic (congestive) heart failure: Secondary | ICD-10-CM

## 2016-09-08 DIAGNOSIS — I482 Chronic atrial fibrillation, unspecified: Secondary | ICD-10-CM

## 2016-09-08 NOTE — Telephone Encounter (Signed)
New Message  Pt call requesting to speak with RN about scheduling a f/u appt with only [name]. Please call back to discuss

## 2016-09-19 ENCOUNTER — Other Ambulatory Visit: Payer: Self-pay | Admitting: Internal Medicine

## 2016-09-19 NOTE — Telephone Encounter (Signed)
Left message on machine for pt to contact the office to schedule echocardiogram prior to appointment with Dr Burt Knack.   Appointment with Dr Burt Knack has been scheduled on 10/19/2016.

## 2016-09-20 NOTE — Telephone Encounter (Signed)
Left message for pt to contact the office to schedule echocardiogram appointment.

## 2016-09-21 NOTE — Telephone Encounter (Signed)
Echocardiogram has been scheduled on 09/29/16.

## 2016-09-29 ENCOUNTER — Other Ambulatory Visit: Payer: Self-pay

## 2016-09-29 ENCOUNTER — Ambulatory Visit (HOSPITAL_COMMUNITY): Payer: Medicare Other | Attending: Internal Medicine

## 2016-09-29 DIAGNOSIS — I340 Nonrheumatic mitral (valve) insufficiency: Secondary | ICD-10-CM | POA: Insufficient documentation

## 2016-09-29 DIAGNOSIS — I482 Chronic atrial fibrillation, unspecified: Secondary | ICD-10-CM

## 2016-09-29 DIAGNOSIS — I5022 Chronic systolic (congestive) heart failure: Secondary | ICD-10-CM | POA: Diagnosis not present

## 2016-09-29 DIAGNOSIS — I2510 Atherosclerotic heart disease of native coronary artery without angina pectoris: Secondary | ICD-10-CM | POA: Insufficient documentation

## 2016-09-29 MED ORDER — PERFLUTREN LIPID MICROSPHERE
1.0000 mL | INTRAVENOUS | Status: AC | PRN
Start: 2016-09-29 — End: 2016-09-29
  Administered 2016-09-29: 3 mL via INTRAVENOUS

## 2016-10-19 ENCOUNTER — Ambulatory Visit (INDEPENDENT_AMBULATORY_CARE_PROVIDER_SITE_OTHER): Payer: Medicare Other | Admitting: Cardiovascular Disease

## 2016-10-19 ENCOUNTER — Encounter: Payer: Self-pay | Admitting: Cardiovascular Disease

## 2016-10-19 VITALS — BP 116/78 | HR 91 | Ht 70.0 in | Wt 282.4 lb

## 2016-10-19 DIAGNOSIS — I482 Chronic atrial fibrillation, unspecified: Secondary | ICD-10-CM

## 2016-10-19 DIAGNOSIS — I5022 Chronic systolic (congestive) heart failure: Secondary | ICD-10-CM | POA: Diagnosis not present

## 2016-10-19 NOTE — Progress Notes (Signed)
Cardiology Office Note Date:  10/23/2016   ID:  [name], DOB 06/08/1946, MRN [id number]  PCP:  Garret Reddish, MD  Cardiologist:  Sherren Mocha, MD    Chief Complaint  Patient presents with  . Congestive Heart Failure  . Coronary Artery Disease  . Atrial Fibrillation     History of Present Illness: [name] is a 71 y.o. male who presents for follow-up evaluation. The patient is been followed for atrial fibrillation and coronary artery disease. Last year he was hospitalized with congestive heart failure. He has combined systolic and diastolic heart failure with an LV ejection fraction of 35%. Cardiac catheterization during his hospitalization showed progressive and severe ostial stenosis of the left circumflex. He had continued patency of his LAD stent and a patent nondominant right coronary artery supplying collaterals to the diagonal branch of the LAD. Ongoing medical therapy was recommended. Other comorbid conditions include obstructive sleep apnea on CPAP, morbid obesity, type 2 diabetes, hypertension, and hyperlipidemia. The patient has failed cardioversion and his atrial fibrillation is managed with the strategy of rate control and anticoagulation. He also has a history of stent thrombosis and he has been maintained on long-term clopidogrel.  The patient is here alone today. He has been doing pretty well and continues to be compliant with medications. He reports no recent change in symptoms. Continues to have exertional dyspnea with moderate activity. No orthopnea, PND, palpitations, or chest pain.   Past Medical History:  Diagnosis Date  . Atrial fibrillation ([place])    initial diagnoses 2012  . B12 deficiency    per patient previously taking shots  . CORONARY ARTERY DISEASE 04/12/2007   2 stents last in 2006.   Marland Kitchen DIABETES MELLITUS, TYPE II 04/16/2007  . Diverticulosis of colon (without mention of hemorrhage)   . HYPERLIPIDEMIA 04/16/2007   reveal study. not sure of  medication  . HYPERTENSION 04/12/2007  . HYPOTHYROIDISM 04/12/2007  . Ischemic cardiomyopathy    cath 35-40%  . MYOCARDIAL INFARCTION, HX OF 04/12/2007  . OBESITY 06/16/2009  . SLEEP APNEA 04/12/2007   CPAP  . Stroke ([place])   . ULCERATIVE COLITIS, LEFT SIDED 11/23/2010    Past Surgical History:  Procedure Laterality Date  . CARDIAC CATHETERIZATION  10/2002   STENT. 2 stents Dr. Maurene Capes  . CARDIAC CATHETERIZATION N/A 10/28/2015   Procedure: Right/Left Heart Cath and Coronary Angiography;  Surgeon: Sherren Mocha, MD;  Location: [facility];  Service: Cardiovascular;  Laterality: N/A;  . CORONARY STENT PLACEMENT  2008   LAD   . TONSILLECTOMY      Current Outpatient Prescriptions  Medication Sig Dispense Refill  . atorvastatin (LIPITOR) 20 MG tablet Take 1 tablet (20 mg total) by mouth daily. 90 tablet 1  . carvedilol (COREG) 25 MG tablet Take 1 tablet (25 mg total) by mouth 2 (two) times daily with a meal. 60 tablet 7  . clopidogrel (PLAVIX) 75 MG tablet Take 75 mg by mouth daily.    . diclofenac sodium (VOLTAREN) 1 % GEL Apply 2 g topically 4 (four) times daily as needed (knee pain).    Marland Kitchen empagliflozin (JARDIANCE) 10 MG TABS tablet Take 10 mg by mouth daily. 30 tablet 5  . fish oil-omega-3 fatty acids 1000 MG capsule Take 2 g by mouth daily.     . furosemide (LASIX) 40 MG tablet Take 40 mg by mouth daily.    Marland Kitchen glipiZIDE (GLUCOTROL) 10 MG tablet Take 10 mg by mouth 2 (two) times daily  before a meal.    . glucosamine-chondroitin 500-400 MG tablet Take 1 tablet by mouth daily as needed (knee flare up or pain).    . isosorbide mononitrate (IMDUR) 60 MG 24 hr tablet Take 1 tablet (60 mg total) by mouth daily. 30 tablet 6  . levothyroxine (SYNTHROID, LEVOTHROID) 50 MCG tablet Take 50 mcg by mouth daily before breakfast.    . mesalamine (APRISO) 0.375 g 24 hr capsule Take 1.5 mg by mouth daily. Take 4 capsules by mouth daily    . metFORMIN (GLUCOPHAGE) 1000 MG tablet Take 1,000 mg by mouth 2  (two) times daily with a meal.    . olmesartan (BENICAR) 40 MG tablet Take 40 mg by mouth daily.    . potassium chloride SA (K-DUR,KLOR-CON) 20 MEQ tablet Take 20 mEq by mouth daily.    Marland Kitchen PRADAXA 150 MG CAPS capsule TAKE ONE TABLET BY MOUTH TWICE A DAY 60 capsule 8  . sitaGLIPtin (JANUVIA) 100 MG tablet Take 100 mg by mouth daily.    Marland Kitchen spironolactone (ALDACTONE) 25 MG tablet TAKE 1/2 TABLET BY MOUTH DAILY. 45 tablet 1   No current facility-administered medications for this visit.     Allergies:   Patient has no known allergies.   Social History:  The patient  reports that he quit smoking about 50 years ago. His smoking use included Cigarettes. He has a 5.00 pack-year smoking history. He has never used smokeless tobacco. He reports that he does not drink alcohol or use drugs.   Family History:  The patient's  family history includes Heart attack in his mother; Heart disease in his father; Hypertension in his brother; Obesity in his brother; Stomach cancer in his maternal aunt and paternal grandmother.    ROS:  Please see the history of present illness.  Otherwise, review of systems is positive for edema.  All other systems are reviewed and negative.    PHYSICAL EXAM: VS:  BP 116/78   Pulse 91   Ht 5' 10"  (1.778 m)   Wt 282 lb 6.4 oz (128.1 kg)   BMI 40.52 kg/m  , BMI Body mass index is 40.52 kg/m. GEN: Pleasant obese male, in no acute distress  HEENT: normal  Neck: no JVD, no masses. No carotid bruits Cardiac: irregular without murmur or gallop                Respiratory:  clear to auscultation bilaterally, normal work of breathing GI: soft, nontender, nondistended, + BS MS: no deformity or atrophy  Ext: no pretibial edema, pedal pulses 2+= bilaterally Skin: warm and dry, no rash Neuro:  Strength and sensation are intact Psych: euthymic mood, full affect  EKG:  EKG is ordered today. The ekg ordered today shows atrial fibrillation 91 bpm, voltage criteria for LVH, nonspecific  ST-T abnormality  Recent Labs: 10/24/2015: B Natriuretic Peptide 157.0 03/28/2016: Hemoglobin 13.5; Platelets 157 10/19/2016: ALT 11; BUN 16; Creatinine, Ser 1.09; Potassium 4.9; Sodium 139   Lipid Panel     Component Value Date/Time   CHOL 88 (L) 10/19/2016 0942   TRIG 232 (H) 10/19/2016 0942   HDL 42 10/19/2016 0942   CHOLHDL 2.1 10/19/2016 0942   CHOLHDL 1.7 10/25/2015 0518   VLDL 16 10/25/2015 0518   LDLCALC Comment (A) 10/19/2016 0942   LDLDIRECT 12.0 08/11/2016 1401      Wt Readings from Last 3 Encounters:  10/19/16 282 lb 6.4 oz (128.1 kg)  08/11/16 287 lb 3.2 oz (130.3 kg)  08/10/16 288 lb (  130.6 kg)     Cardiac Studies Reviewed: Cardiac Cath 10-28-2015:    Conclusion     Prox LAD lesion, 20% stenosed. The lesion was previously treated with a drug-eluting stent greater than two years ago.  1st Diag lesion, 100% stenosed.  Ost Cx lesion, 80% stenosed.  Ost 2nd Mrg to 2nd Mrg lesion, 95% stenosed.  Mid RCA lesion, 25% stenosed.  Ost Ramus lesion, 50% stenosed.  There is moderate to severe left ventricular systolic dysfunction.  1. Severe LCx stenosis, involving the ostium of the vessel and ostium of OM1, anatomy unsuitable for PCI 2. Continued patency of the stented segment in the LAD and total occlusion of the first diagonal with collateral flow from the RCA 3. Widely patent, dominant RCA 4. Moderately severe segmental LV dysfunction  Recommend: medical therapy for CHF/CAD. Aggressive risk reduction. Resume pradaxa tonight, anticipate DC home tomorrow.   Echo 10-25-2015: Study Conclusions  - Procedure narrative: Transthoracic echocardiography. Image  quality was poor. The study was technically difficult, as a  result of body habitus. Intravenous contrast (Definity) was  administered. - Left ventricle: Wall thickness was increased in a pattern of  severe LVH, however, the anterior wall and apex are thinned and  hyperechoic. Definity contrast  opacifies the apex, there is no  mural thrombus. Systolic function was moderately reduced. The  estimated ejection fraction was in the range of 35% to 40%.  Anteroseptal, anterior, apical and inferoapical akinesis  suggestive of prior LAD infarct. The study is not technically  sufficient to allow evaluation of LV diastolic function. - Mitral valve: Mildly thickened leaflets . There was trivial  regurgitation. - Left atrium: The atrium was mildly dilated. - Inferior vena cava: The vessel was dilated. The respirophasic  diameter changes were blunted (< 50%), consistent with elevated  central venous pressure. - Pericardium, extracardiac: Features were not consistent with  tamponade physiology.  Impressions:  - Compared to a prior study in 07/2015, there has been no change.  No mural thrombus.  Echo 09-29-2016: Study Conclusions  - Procedure narrative: Transthoracic echocardiography for   diagnosis, for left ventricular function evaluation, for right   ventricular function evaluation, and for assessment of valvular   function. Image quality was adequate. The study was technically   difficult, as a result of poor sound wave transmission.   Intravenous contrast (Definity) was administered to opacify the   LV. - Left ventricle: The cavity size was normal. There was severe   concentric hypertrophy. Systolic function was mildly to   moderately reduced. The estimated ejection fraction was in the   range of 40% to 45%. Anteroseptal and anteroapical hypokinesis.   The study is not technically sufficient to allow evaluation of LV   diastolic function. - Mitral valve: Mildly thickened leaflets . There was trivial   regurgitation. - Left atrium: Moderately dilated. - Inferior vena cava: The vessel was normal in size. The   respirophasic diameter changes were in the normal range (>= 50%),   consistent with normal central venous pressure.  Impressions:  - Technically  difficult study. Definity contrast given. LVEF   40-45%, anteroseptal and anteroapical hypokinesis, moderate LAE.   Compared to a prior study in 10/2015, the LVEF is slightly   improved.  Carotid duplex 10-25-2015: RIGHT CAROTID ARTERY: Mild calcified plaque in the bulb. Low resistance internal carotid Doppler pattern.  RIGHT VERTEBRAL ARTERY: Antegrade. Low resistance Doppler pattern.  LEFT CAROTID ARTERY: Minimal plaque in the bulb. Low resistance internal carotid Doppler pattern.  LEFT VERTEBRAL ARTERY: Antegrade. Low resistance Doppler pattern.  IMPRESSION: Less than 50% stenosis in the right and left internal carotid Arteries.  ASSESSMENT AND PLAN: 1.  Chronic combined systolic and diastolic heart failure: NYHA functional class II. The patient's medical program is reviewed and will be continued without change. He appears to be well compensated. His most recent echo data is reviewed demonstrating moderate segmental LV systolic dysfunction. The patient is treated with a beta blocker, aldactone, and an ARB.  2. Coronary artery disease, native vessel: The patient is stable without symptoms of angina. He is on long-term clopidogrel with his history of stent thrombosis.  3. Permanent atrial fibrillation: Tolerating oral anticoagulation with pradaxa. No bleeding problems reported. Heart rate is well controlled on current medical therapy.  4. Hyperlipidemia: The patient has a somewhat unusual lipid panel with a cholesterol of 88, HDL 42, triglycerides 232, and an LDL which cannot be calculated because it is so low. Despite that, he has aggressive coronary disease, obesity, and diabetes. He will benefit from continuation of a statin drug.  5. Type 2 diabetes: Treated with an ARB. On oral hypoglycemics including metformin. Followed by his primary physician. Weight loss, diet, and exercise strategies reviewed with the patient.  Current medicines are reviewed with the patient today.   The patient does not have concerns regarding medicines.  Labs/ tests ordered today include:   Orders Placed This Encounter  Procedures  . Comp Met (CMET)  . Lipid panel  . EKG 12-Lead    Disposition:   FU 6 months  Signed, Sherren Mocha, MD  10/23/2016 9:02 AM    [facility] [place], [place], [place]  [address] Phone: [phone]; Fax: [phone]

## 2016-10-19 NOTE — Patient Instructions (Signed)
Medication Instructions:  Your physician recommends that you continue on your current medications as directed. Please refer to the Current Medication list given to you today.  Labwork: Your physician recommends that you have lab work today: CMP and LIPID  Testing/Procedures: No new orders.   Follow-Up: Your physician wants you to follow-up in: 6 MONTHS with Dr Burt Knack.  You will receive a reminder letter in the mail two months in advance. If you don't receive a letter, please call our office to schedule the follow-up appointment.   Any Other Special Instructions Will Be Listed Below (If Applicable).     If you need a refill on your cardiac medications before your next appointment, please call your pharmacy.

## 2016-10-20 ENCOUNTER — Encounter: Payer: Self-pay | Admitting: Gastroenterology

## 2016-10-20 LAB — COMPREHENSIVE METABOLIC PANEL
ALBUMIN: 4.3 g/dL (ref 3.5–4.8)
ALK PHOS: 87 IU/L (ref 39–117)
ALT: 11 IU/L (ref 0–44)
AST: 16 IU/L (ref 0–40)
Albumin/Globulin Ratio: 1.9 (ref 1.2–2.2)
BUN/Creatinine Ratio: 15 (ref 10–24)
BUN: 16 mg/dL (ref 8–27)
Bilirubin Total: 0.5 mg/dL (ref 0.0–1.2)
CALCIUM: 9 mg/dL (ref 8.6–10.2)
CO2: 25 mmol/L (ref 18–29)
CREATININE: 1.09 mg/dL (ref 0.76–1.27)
Chloride: 99 mmol/L (ref 96–106)
GFR calc Af Amer: 79 mL/min/{1.73_m2} (ref >59)
GFR, EST NON AFRICAN AMERICAN: 68 mL/min/{1.73_m2} (ref >59)
GLOBULIN, TOTAL: 2.3 g/dL (ref 1.5–4.5)
GLUCOSE: 225 mg/dL — AB (ref 65–99)
Potassium: 4.9 mmol/L (ref 3.5–5.2)
Sodium: 139 mmol/L (ref 134–144)
Total Protein: 6.6 g/dL (ref 6.0–8.5)

## 2016-10-20 LAB — LIPID PANEL
CHOLESTEROL TOTAL: 88 mg/dL — AB (ref 100–199)
Chol/HDL Ratio: 2.1 ratio units (ref 0.0–5.0)
HDL: 42 mg/dL (ref >39)
TRIGLYCERIDES: 232 mg/dL — AB (ref 0–149)
VLDL CHOLESTEROL CAL: 46 mg/dL — AB (ref 5–40)

## 2016-10-28 ENCOUNTER — Other Ambulatory Visit: Payer: Self-pay | Admitting: Cardiovascular Disease

## 2016-11-08 DIAGNOSIS — H2513 Age-related nuclear cataract, bilateral: Secondary | ICD-10-CM | POA: Diagnosis not present

## 2016-11-08 DIAGNOSIS — H524 Presbyopia: Secondary | ICD-10-CM | POA: Diagnosis not present

## 2016-11-08 DIAGNOSIS — E119 Type 2 diabetes mellitus without complications: Secondary | ICD-10-CM | POA: Diagnosis not present

## 2016-11-08 LAB — HM DIABETES EYE EXAM

## 2016-11-10 ENCOUNTER — Encounter: Payer: Self-pay | Admitting: Family Medicine

## 2016-11-11 ENCOUNTER — Other Ambulatory Visit: Payer: Self-pay | Admitting: Gastroenterology

## 2016-11-11 ENCOUNTER — Other Ambulatory Visit: Payer: Self-pay | Admitting: Family Medicine

## 2016-11-16 ENCOUNTER — Telehealth: Payer: Self-pay | Admitting: Family Medicine

## 2016-11-16 NOTE — Telephone Encounter (Signed)
[facility] pharm states pt insurance will no longer pay for name brand benicar. Pharm contacted pt and states he is ok to switch to generic. Pharm needs new Rx  olmesartan (BENICAR) 40 MG tablet

## 2016-11-17 ENCOUNTER — Other Ambulatory Visit: Payer: Self-pay

## 2016-11-17 MED ORDER — OLMESARTAN MEDOXOMIL 40 MG PO TABS: 40.0000 mg | ORAL_TABLET | Freq: Every day | ORAL | 3 refills | Status: DC

## 2016-11-17 NOTE — Telephone Encounter (Signed)
Prescription sent to pharmacy.

## 2016-12-07 ENCOUNTER — Other Ambulatory Visit: Payer: Self-pay | Admitting: Gastroenterology

## 2016-12-08 ENCOUNTER — Ambulatory Visit (INDEPENDENT_AMBULATORY_CARE_PROVIDER_SITE_OTHER): Payer: Medicare Other | Admitting: Internal Medicine

## 2016-12-08 ENCOUNTER — Encounter: Payer: Self-pay | Admitting: Internal Medicine

## 2016-12-08 VITALS — BP 140/84 | HR 86 | Ht 68.0 in | Wt 284.0 lb

## 2016-12-08 DIAGNOSIS — E1165 Type 2 diabetes mellitus with hyperglycemia: Secondary | ICD-10-CM | POA: Diagnosis not present

## 2016-12-08 DIAGNOSIS — I2510 Atherosclerotic heart disease of native coronary artery without angina pectoris: Secondary | ICD-10-CM

## 2016-12-08 DIAGNOSIS — E1151 Type 2 diabetes mellitus with diabetic peripheral angiopathy without gangrene: Secondary | ICD-10-CM

## 2016-12-08 NOTE — Patient Instructions (Addendum)
Please continue: - Glipizide 10 mg 2x a day - Metformin 1000 mg 2x a day - Januvia 100 mg daily  - Jardiance 10 mg daily every other day   Please stop at the lab.  Please return in 3 months with your sugar log.

## 2016-12-08 NOTE — Progress Notes (Signed)
Patient ID: [name], male   DOB: Sep 22, 1946, 71 y.o.   MRN: [id number]  HPI: [name] is a 71 y.o.-year-old male, returning for DM2, dx 2004, non-insulin-dependent, uncontrolled, with complications (CAD, s/p MI). Last visit 3 mo ago. He has [facility] as supplemental and [employer] for meds in 10/2015.  Last hemoglobin A1c was: 08/10/2016: HbA1c calculated from  fructosamine is 7.88% 05/10/2016: HbA1c calculated from fructosamine is 7.6% 01/06/2016: HbA1c calculated from fructosamine is 6.6% 09/06/2016: HbA1c calculated from fructosamine is 7.0% 06/01/2015: HbA1c calculated from fructosamine is 7.0% 02/23/2015: HbA1c calculated from fructosamine is 6.88% 10/15/2014: HbA1c calculated from fructosamine is 7.17% Lab Results  Component Value Date   HGBA1C 7.6 (H) 10/25/2015   HGBA1C 8.7 (H) 10/15/2014   HGBA1C 7.7 (H) 07/11/2014  He started to change his eating habits since 03/2013.   Pt is on a regimen of: - Glipizide 10 mg bid  - Metformin 1000 mg bid  - Januvia 100 mg daily in am - Jardiance 10 mg daily in am >> increased urination >> now taking it qod >> tolerating it much better He does not miss doses.  Pt checks his sugars 4x a day (we reviewed his log) >> better: - am:  99, 102-123 >> 102-130 >> 106-132 >> 113-132 >> 107-132 - 2h after breakfast:  129-149, 159 >> 135-154 >> 139-164 >> 126-150 - before lunch 144 >> 110-120s >> n/c  - before dinner:  97-118, 127 >> 97, 107-126 >> 95-110, 121 >> 95-119 - 2h after dinner: 148-165 >> 129-152 >> 144-165 >> 143-175 >> 128-147, 157 No lows; he has hypoglycemia awareness at mid60s.   His wife is a Radiographer, therapeutic >> he is still eating sweets occasionally.  He goes to [employer] place - 3x a week (MWF) - 1h/session, starting 8:30-9 am.  He is seeing the nutritionist [facility].  - no CKD, last BUN/creatinine:  Lab Results  Component Value Date   BUN 16 10/19/2016   CREATININE 1.09 10/19/2016  he is on  Benicar 40. - last set of lipids: Lab Results  Component Value Date   CHOL 88 (L) 10/19/2016   HDL 42 10/19/2016   LDLCALC Comment (A) 10/19/2016   LDLDIRECT 12.0 08/11/2016   TRIG 232 (H) 10/19/2016   CHOLHDL 2.1 10/19/2016  He was in the Reveal Study x 4 years >> stopped. He is on omega 3 fatty acids. He is on Lipitor 20 mg.  - last eye exam was 11/08/2016 - No DR (Dr [name])  - no numbness and tingling in his feet.  He also has a history of hypothyroidism, hyperlipidemia, OSA-on CPAP, very compliant, A. Fib-on Plavix and pradaxa, IBD, GERD, vitamin B12 deficiency - previously on im B12, obesity.  Last TSH: Lab Results  Component Value Date   TSH 1.66 06/29/2015  He takes LT4 50 mcg daily.   ROS: Constitutional: no weight loss/gain, no fatigue, no subjective hyperthermia/hypothermia Eyes: no blurry vision, no xerophthalmia ENT: no sore throat, no nodules palpated in throat, no dysphagia/odynophagia, no hoarseness;  Cardiovascular: no CP (chronic/SOB/no palpitations/leg swelling Respiratory: no cough/SOB Gastrointestinal: no N/V/D/C Musculoskeletal: no muscle/joint aches Skin: no rashes;   I reviewed pt's medications, allergies, PMH, social hx, family hx, and changes were documented in the history of present illness. Otherwise, unchanged from my initial visit note.  PE: BP 140/84 (BP Location: Left Arm, Patient Position: Sitting)   Pulse 86   Ht 5' 8"  (1.727 m)   Wt 284 lb (  128.8 kg)   SpO2 96%   BMI 43.18 kg/m  Body mass index is 43.18 kg/m. Wt Readings from Last 3 Encounters:  12/08/16 284 lb (128.8 kg)  10/19/16 282 lb 6.4 oz (128.1 kg)  08/11/16 287 lb 3.2 oz (130.3 kg)   Constitutional: obese,  in NAD Eyes: PERRLA, EOMI, no exophthalmos ENT: moist mucous membranes, no thyromegaly, no cervical lymphadenopathy Cardiovascular: irregularly irregular, regular rate, No MRG; + bilateral leg swelling (compression hoses) Respiratory: CTA B Gastrointestinal:  abdomen soft, NT, ND, BS+ Musculoskeletal: no deformities, strength intact in all 4 Skin: moist, warm, no rashes  ASSESSMENT: 1. DM2, non-insulin-dependent, uncontrolled, with complications - CAD, s/p MI 10/2006 - s/p stent - sees [name]  PLAN:  1. Patient with long-standing DM2, with slightly higher postprandial sugars at this visit, now much bette, on Jardiance added at last visit >> however, he could not take it every day 2/2 increased urination (also takes Lasix) >> takes it qod, with still good results. Will not change this intermittent dose. - Last HbA1c (calculated from fructosamine) was 7.88%.  - I advised him to Patient Instructions  Please continue: - Glipizide 10 mg 2x a day - Metformin 1000 mg 2x a day - Januvia 100 mg daily  - Jardiance 10 mg daily every other day   Please stop at the lab.  Please return in 3 months with your sugar log.    - will check fructosamine today - UTD with eye exams - Return to clinic in 2 mo with sugar log   Office Visit on 12/08/2016  Component Date Value Ref Range Status  . Fructosamine 12/08/2016 309* 190 - 270 umol/L Final  HbA1c calculated from the fructosamine is better, at 6.86%.  Philemon Kingdom, MD PhD [facility] Endocr[facility]

## 2016-12-12 LAB — FRUCTOSAMINE: Fructosamine: 309 umol/L — ABNORMAL HIGH (ref 190–270)

## 2017-01-05 ENCOUNTER — Encounter: Payer: Self-pay | Admitting: Cardiovascular Disease

## 2017-01-06 ENCOUNTER — Encounter: Payer: Self-pay | Admitting: Cardiovascular Disease

## 2017-01-12 ENCOUNTER — Other Ambulatory Visit: Payer: Self-pay | Admitting: Gastroenterology

## 2017-01-20 ENCOUNTER — Other Ambulatory Visit: Payer: Self-pay | Admitting: Family Medicine

## 2017-02-06 ENCOUNTER — Encounter: Payer: Self-pay | Admitting: Gastroenterology

## 2017-02-06 ENCOUNTER — Ambulatory Visit (INDEPENDENT_AMBULATORY_CARE_PROVIDER_SITE_OTHER): Payer: Medicare Other | Admitting: Gastroenterology

## 2017-02-06 VITALS — BP 146/84 | HR 88 | Ht 66.75 in | Wt 283.1 lb

## 2017-02-06 DIAGNOSIS — K5100 Ulcerative (chronic) pancolitis without complications: Secondary | ICD-10-CM

## 2017-02-06 DIAGNOSIS — Z7901 Long term (current) use of anticoagulants: Secondary | ICD-10-CM | POA: Diagnosis not present

## 2017-02-06 MED ORDER — MESALAMINE ER 0.375 G PO CP24: 1.5000 g | ORAL_CAPSULE | Freq: Every day | ORAL | 11 refills | Status: DC

## 2017-02-06 NOTE — Patient Instructions (Addendum)
We have sent the following medications to your pharmacy for you to pick up at your convenience: Apriso.   Normal BMI (Body Mass Index- based on height and weight) is between 23 and 30. Your BMI today is Body mass index is 44.68 kg/m. Marland Kitchen Please consider follow up  regarding your BMI with your Primary Care Provider.  Thank you for choosing me and [facility].  Pricilla Riffle. Dagoberto Ligas., MD., Marval Regal

## 2017-02-06 NOTE — Progress Notes (Signed)
History of Present Illness: This is a 71 year old male returning for follow-up of universal ulcerative colitis. He is maintained on Pradaxa and Plavix for afib and CAD. History of OSA, DM, CHF. No active UC symptoms at this time. He is due for surveillance colonoscopy. He relates that his brother passed away this morning. Denies weight loss, abdominal pain, constipation, diarrhea, change in stool caliber, melena, hematochezia, nausea, vomiting, dysphagia, reflux symptoms, chest pain.    No Known Allergies Outpatient Medications Prior to Visit  Medication Sig Dispense Refill  . APRISO 0.375 g 24 hr capsule TAKE 4 CAPSULES DAILY 120 capsule 0  . atorvastatin (LIPITOR) 20 MG tablet Take 1 tablet (20 mg total) by mouth daily. 90 tablet 1  . carvedilol (COREG) 25 MG tablet Take 1 tablet (25 mg total) by mouth 2 (two) times daily with a meal. 60 tablet 7  . clopidogrel (PLAVIX) 75 MG tablet TAKE ONE (1) TABLET EACH DAY 30 tablet 5  . diclofenac sodium (VOLTAREN) 1 % GEL Apply 2 g topically 4 (four) times daily as needed (knee pain).    Marland Kitchen empagliflozin (JARDIANCE) 10 MG TABS tablet Take 10 mg by mouth daily. 30 tablet 5  . fish oil-omega-3 fatty acids 1000 MG capsule Take 2 g by mouth daily.     . furosemide (LASIX) 40 MG tablet Take 40 mg by mouth daily.    Marland Kitchen glipiZIDE (GLUCOTROL) 10 MG tablet Take 10 mg by mouth 2 (two) times daily before a meal.    . glucosamine-chondroitin 500-400 MG tablet Take 1 tablet by mouth daily as needed (knee flare up or pain).    . isosorbide mononitrate (IMDUR) 60 MG 24 hr tablet Take 1 tablet (60 mg total) by mouth daily. 30 tablet 6  . metFORMIN (GLUCOPHAGE) 1000 MG tablet Take 1,000 mg by mouth 2 (two) times daily with a meal.    . olmesartan (BENICAR) 40 MG tablet Take 1 tablet (40 mg total) by mouth daily. 90 tablet 3  . potassium chloride SA (K-DUR,KLOR-CON) 20 MEQ tablet TAKE ONE (1) TABLET BY MOUTH EVERY DAY 90 tablet 3  . PRADAXA 150 MG CAPS capsule TAKE  ONE TABLET BY MOUTH TWICE A DAY 60 capsule 8  . sitaGLIPtin (JANUVIA) 100 MG tablet Take 100 mg by mouth daily.    Marland Kitchen spironolactone (ALDACTONE) 25 MG tablet TAKE 1/2 TABLET BY MOUTH DAILY. 45 tablet 1  . SYNTHROID 50 MCG tablet TAKE 1 TABLET DAILY BEFORE BREAKFAST 90 tablet 3  . clopidogrel (PLAVIX) 75 MG tablet Take 75 mg by mouth daily.    Marland Kitchen levothyroxine (SYNTHROID, LEVOTHROID) 50 MCG tablet Take 50 mcg by mouth daily before breakfast.    . mesalamine (APRISO) 0.375 g 24 hr capsule Take 1.5 mg by mouth daily. Take 4 capsules by mouth daily     No facility-administered medications prior to visit.    Past Medical History:  Diagnosis Date  . Atrial fibrillation ([place])    initial diagnoses 2012  . B12 deficiency    per patient previously taking shots  . CORONARY ARTERY DISEASE 04/12/2007   2 stents last in 2006.   Marland Kitchen DIABETES MELLITUS, TYPE II 04/16/2007  . Diverticulosis of colon (without mention of hemorrhage)   . HYPERLIPIDEMIA 04/16/2007   reveal study. not sure of medication  . HYPERTENSION 04/12/2007  . HYPOTHYROIDISM 04/12/2007  . Ischemic cardiomyopathy    cath 35-40%  . MYOCARDIAL INFARCTION, HX OF 04/12/2007  . OBESITY 06/16/2009  . SLEEP APNEA 04/12/2007  CPAP  . Stroke ([place])   . ULCERATIVE COLITIS, LEFT SIDED 11/23/2010   Past Surgical History:  Procedure Laterality Date  . CARDIAC CATHETERIZATION  10/2002   STENT. 2 stents Dr. Maurene Capes  . CARDIAC CATHETERIZATION N/A 10/28/2015   Procedure: Right/Left Heart Cath and Coronary Angiography;  Surgeon: Sherren Mocha, MD;  Location: [facility];  Service: Cardiovascular;  Laterality: N/A;  . CORONARY STENT PLACEMENT  2008   LAD   . TONSILLECTOMY     Social History   Social History  . Marital status: Married    Spouse name: N/A  . Number of children: 1  . Years of education: N/A   Occupational History  . RETIRED Retired   Social History Main Topics  . Smoking status: Former Smoker    Packs/day: 1.00    Years: 5.00     Types: Cigarettes    Quit date: 04/13/1966  . Smokeless tobacco: Never Used  . Alcohol use No     Comment: no more beer  . Drug use: No  . Sexual activity: Not Asked   Other Topics Concern  . None   Social History Narrative   Cardiorehab 3 days a week. 45 minutes to an hour-stationary bike.    GRANDDAUGHTER (MS. 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       Married for 37 years in 2015, married previously for 14 years. Daughter with first wife and 3 grandkids.    Lives alone with wife. Get to see grandkids a lot. New grandchild in middle of 49      Hobbies-yardwork, previously liked to hunt and fish, does some target shooting   Family History  Problem Relation Age of Onset  . Heart attack Mother     mid 56s  . Heart disease Father     H/O CAD, CABG, VALVE SURGERY  . Hypertension Brother   . Obesity Brother   . Stomach cancer Maternal Aunt   . Stomach cancer Paternal Grandmother     ? colon        Physical Exam: General: Well developed, well nourished, obese, no acute distress Head: Normocephalic and atraumatic Eyes:  sclerae anicteric, EOMI Ears: Normal auditory acuity Mouth: No deformity or lesions Lungs: Clear throughout to auscultation Heart: Regular rate and rhythm; no murmurs, rubs or bruits Abdomen: Soft, non tender and non distended. No masses, hepatosplenomegaly or hernias noted. Normal Bowel sounds Rectal: Deferred to colonoscopy Musculoskeletal: Symmetrical with no gross deformities  Pulses:  Normal pulses noted Extremities: No clubbing, cyanosis, edema or deformities noted Neurological: Alert oriented x 4, grossly nonfocal Psychological:  Alert and cooperative. Normal mood and affect  Assessment and Recommendations:  1. UC, well controlled. Refill Apriso 1.5 g po daily. CMP from 10/2016 and CBC from 03/2016 were reviewed. He is due for surveillance  colonoscopy. He is appropriately concerned about the risk of stopping anticoagulation and the risks of colonoscopy with his comorbidities and anticoagulation. We discussed the possibility of performing colonoscopy while taking Pradaxa and Plavix, performing the colonoscopy off Pradaxa and Plavix and not performing colonoscopy for surveillance purposes. Although it is not possible to know for certain, the risks of proceeding with colonoscopy off anticoagulants are high and probably outweigh the benefits. The benefit of a diagnostic colonoscopy on Pradaxa and Plavix, without taking biopsies for ulcerative colitis surveillance, is lower with taking biopsies  so this is not a clear-cut, attractive option. At this point he prefers to defer surveillance colonoscopies and I feel that this is a reasonable decision. I will review with [name]. If he feels the risk of stopping Pradaxa and Plavix is low we can revisit this discussion. We can revisit this issue at his follow-up visit next year.

## 2017-02-07 ENCOUNTER — Other Ambulatory Visit: Payer: Self-pay | Admitting: Internal Medicine

## 2017-02-07 ENCOUNTER — Other Ambulatory Visit: Payer: Self-pay | Admitting: Cardiovascular Disease

## 2017-02-07 DIAGNOSIS — E785 Hyperlipidemia, unspecified: Secondary | ICD-10-CM

## 2017-02-15 ENCOUNTER — Other Ambulatory Visit: Payer: Self-pay | Admitting: Cardiovascular Disease

## 2017-03-10 ENCOUNTER — Encounter: Payer: Self-pay | Admitting: Internal Medicine

## 2017-03-10 ENCOUNTER — Ambulatory Visit (INDEPENDENT_AMBULATORY_CARE_PROVIDER_SITE_OTHER): Payer: Medicare Other | Admitting: Internal Medicine

## 2017-03-10 VITALS — BP 152/86 | HR 95 | Wt 282.0 lb

## 2017-03-10 DIAGNOSIS — E1151 Type 2 diabetes mellitus with diabetic peripheral angiopathy without gangrene: Secondary | ICD-10-CM

## 2017-03-10 LAB — POCT GLYCOSYLATED HEMOGLOBIN (HGB A1C): HEMOGLOBIN A1C: 9.3

## 2017-03-10 NOTE — Patient Instructions (Addendum)
Please continue: - Glipizide 10 mg 2x a day - Metformin 1000 mg 2x a day - Januvia 100 mg daily  - Jardiance 10 mg daily every other day   Please reduce coffee to 1 cup a day.  Please return in 3 months with your sugar log.

## 2017-03-10 NOTE — Progress Notes (Signed)
Patient ID: [name], male   DOB: 08-12-1946, 71 y.o.   MRN: [id number]  HPI: [name] is a 71 y.o.-year-old male, returning for DM2, dx 2004, non-insulin-dependent, uncontrolled, with complications (CAD, s/p MI). Last visit 4 mo ago. He has [facility] as supplemental and [employer] for meds in 12/02/2015.  His brother (76 y/o) died in Mar 01, 2017. Pt was stressed, eating out. Sugars higher.  Last hemoglobin A1c was: 12/08/2016: HbA1c calculated from the fructosamine is better, at 6.86%. 08/10/2016: HbA1c calculated from  fructosamine is 7.88% 05/10/2016: HbA1c calculated from fructosamine is 7.6% 01/06/2016: HbA1c calculated from fructosamine is 6.6% 09/06/2016: HbA1c calculated from fructosamine is 7.0% 06/01/2015: HbA1c calculated from fructosamine is 7.0% 02/23/2015: HbA1c calculated from fructosamine is 6.88% 10/15/2014: HbA1c calculated from fructosamine is 7.17% Lab Results  Component Value Date   HGBA1C 7.6 (H) 10/25/2015   HGBA1C 8.7 (H) 10/15/2014   HGBA1C 7.7 (H) 07/11/2014  He started to change his eating habits since 03/2013.   Pt is on a regimen of: - Glipizide 10 mg bid  - Metformin 1000 mg bid  - Januvia 100 mg daily in am - Jardiance 10 mg daily in am >> increased urination >> now taking it qod  He does not miss doses.  Pt checks his sugars 4x a day - slightly higher: - am:  99, 102-123 >> 102-130 >> 106-132 >> 113-132 >> 107-132 >> 114-150 - 2h after breakfast:  129-149, 159 >> 135-154 >> 139-164 >> 126-150 >> 143-162 - before lunch 144 >> 110-120s >> n/c  - before dinner:  97-118, 127 >> 97, 107-126 >> 95-110, 121 >> 95-119 >> 102-131 - 2h after dinner: 148-165 >> 129-152 >> 144-165 >> 143-175 >> 128-147, 157 >> 130-167 No lows; he has hypoglycemia awareness in the 60s.  His wife is a Radiographer, therapeutic >> eating sweets occasionally. He goes to [employer] place - 3x a week (MWF) - 1h/session: starting at 8-9 am..  He is seeing the nutritionist at the  Card[facility].  - No CKD, last BUN/creatinine:  Lab Results  Component Value Date   BUN 16 10/19/2016   CREATININE 1.09 10/19/2016  On Benicar 40. - last set of lipids: Lab Results  Component Value Date   CHOL 88 (L) 10/19/2016   HDL 42 10/19/2016   LDLCALC Comment (A) 10/19/2016   LDLDIRECT 12.0 08/11/2016   TRIG 232 (H) 10/19/2016   CHOLHDL 2.1 10/19/2016  He was in the Reveal Study x 4 years >> stopped. He is on omega 3 fatty acids.On Lipitor.  - last eye exam was 12/01/16: No DR ([name]). He has cataracts - incipient.  - He denies numbness and tingling in his feet.  He also has a history of hypothyroidism, hyperlipidemia, OSA-on CPAP, very compliant, A. Fib-on Plavix and pradaxa, IBD, GERD, vitamin B12 deficiency - previously on im B12, obesity.  Last TSH: Lab Results  Component Value Date   TSH 1.66 06/29/2015  On LT4.  ROS: Constitutional: no weight gain/no weight loss, no fatigue, no subjective hyperthermia, no subjective hypothermia Eyes: no blurry vision, no xerophthalmia ENT: no sore throat, no nodules palpated in throat, no dysphagia, no odynophagia, no hoarseness Cardiovascular: no CP/no SOB/no palpitations/no leg swelling Respiratory: no cough/no SOB/no wheezing Gastrointestinal: no N/no V/no D/no C/no acid reflux Musculoskeletal: no muscle aches/no joint aches Skin: no rashes, no hair loss Neurological: no tremors/no numbness/no tingling/no dizziness  I reviewed pt's medications, allergies, PMH, social hx, family hx, and changes were documented  in the history of present illness. Otherwise, unchanged from my initial visit note.   PE: BP (!) 152/86   Pulse 95   Wt 282 lb (127.9 kg)   SpO2 96%   BMI 44.50 kg/m  Body mass index is 44.5 kg/m. Wt Readings from Last 3 Encounters:  03/10/17 282 lb (127.9 kg)  02/06/17 283 lb 2 oz (128.4 kg)  12/08/16 284 lb (128.8 kg)   Constitutional: overweight, in NAD Eyes: PERRLA, EOMI, no exophthalmos ENT:  moist mucous membranes, no thyromegaly, no cervical lymphadenopathy Cardiovascular: irreg. Irreg., RR, No MRG, wears compression hoses Respiratory: CTA B Gastrointestinal: abdomen soft, NT, ND, BS+ Musculoskeletal: no deformities, strength intact in all 4 Skin: moist, warm, no rashes Neurological: no tremor with outstretched hands, DTR normal in all 4   ASSESSMENT: 1. DM2, non-insulin-dependent, uncontrolled, with complications - CAD, s/p MI 10/2006 - s/p stent - sees [name]  PLAN:  1. Patient with long-standing DM2, with fair control on po diabetic regimen. His sugars have been higher recently 2/2 stress from losing his brother, but he is slowly getting back to normal.  - he still has increased urination but he: is on Jardiance (QOD b/c of the polyuria), Lasix, drinks a lot of water but also 3-4 cups of coffee a day >> advised to decrease coffee intake to only 1 cup a day. - Last HbA1c (calculated from fructosamine) was 6.86%. - I advised him to Patient Instructions  Please continue: - Glipizide 10 mg 2x a day - Metformin 1000 mg 2x a day - Januvia 100 mg daily  - Jardiance 10 mg daily every other day   Please reduce coffee to 1 cup a day.  Please return in 3 months with your sugar log.   - today, HbA1c is 9.3% - higher - his HbA1c calculated from fructosamine is usually lower than the measured one. However, today, his HbA1c is higher than before and I think his fructosamine would be too. I will check fructosamine at next visit - continue checking sugars at different times of the day - check 4x a day, rotating checks - advised for yearly eye exams >> he is UTD - Return to clinic in 3 mo with sugar log    Philemon Kingdom, MD PhD [facility] Endocrinology

## 2017-03-16 ENCOUNTER — Other Ambulatory Visit: Payer: Self-pay | Admitting: Cardiovascular Disease

## 2017-04-20 ENCOUNTER — Ambulatory Visit (INDEPENDENT_AMBULATORY_CARE_PROVIDER_SITE_OTHER): Payer: Medicare Other | Admitting: Cardiovascular Disease

## 2017-04-20 ENCOUNTER — Encounter: Payer: Self-pay | Admitting: Cardiovascular Disease

## 2017-04-20 VITALS — BP 128/78 | HR 79 | Ht 68.5 in | Wt 282.2 lb

## 2017-04-20 DIAGNOSIS — I482 Chronic atrial fibrillation: Secondary | ICD-10-CM

## 2017-04-20 DIAGNOSIS — I5022 Chronic systolic (congestive) heart failure: Secondary | ICD-10-CM

## 2017-04-20 DIAGNOSIS — I4821 Permanent atrial fibrillation: Secondary | ICD-10-CM

## 2017-04-20 NOTE — Progress Notes (Signed)
Cardiology Office Note Date:  04/20/2017   ID:  [name], [name] 07/12/1946, MRN [id number]  PCP:  Marin Olp, MD  Cardiologist:  Sherren Mocha, MD    Chief Complaint  Patient presents with  . Follow-up    CAD, CHF     History of Present Illness: [name] is a 71 y.o. male who presents for Permanent atrial fibrillation, coronary artery disease, and cardiomyopathy with chronic combined systolic and diastolic heart failure. The patient's last heart catheterization in 2017 demonstrated continued patency of the stented segment in his LAD, severe ostial stenosis of the left circumflex not amenable to PCI, and a patent nondominant right coronary artery supplying collaterals to the diagonal branch of the LAD. Ongoing medical therapy was recommended at that time. Other comorbid conditions include obstructive sleep apnea on CPAP, morbid obesity, type 2 diabetes, hypertension, and hyperlipidemia. The patient also has a history of stent thrombosis and has been maintained on long-term clopidogrel.  He is still doing the maintenance phase of outpatient cardiac rehab. He's not having too much trouble, shortness of breath with exertion persists. No chest pain or pressure. He denies any recent bleeding problems. He denies orthopnea or PND. He's had long-standing mild leg swelling without recent change.  Past Medical History:  Diagnosis Date  . Atrial fibrillation ([place])    initial diagnoses 2012  . B12 deficiency    per patient previously taking shots  . CORONARY ARTERY DISEASE 04/12/2007   2 stents last in 2006.   Marland Kitchen DIABETES MELLITUS, TYPE II 04/16/2007  . Diverticulosis of colon (without mention of hemorrhage)   . HYPERLIPIDEMIA 04/16/2007   reveal study. not sure of medication  . HYPERTENSION 04/12/2007  . HYPOTHYROIDISM 04/12/2007  . Ischemic cardiomyopathy    cath 35-40%  . MYOCARDIAL INFARCTION, HX OF 04/12/2007  . OBESITY 06/16/2009  . SLEEP APNEA 04/12/2007   CPAP  . Stroke ([place])    . ULCERATIVE COLITIS, LEFT SIDED 11/23/2010    Past Surgical History:  Procedure Laterality Date  . CARDIAC CATHETERIZATION  10/2002   STENT. 2 stents Dr. Maurene Capes  . CARDIAC CATHETERIZATION N/A 10/28/2015   Procedure: Right/Left Heart Cath and Coronary Angiography;  Surgeon: Sherren Mocha, MD;  Location: [facility];  Service: Cardiovascular;  Laterality: N/A;  . CORONARY STENT PLACEMENT  2008   LAD   . TONSILLECTOMY      Current Outpatient Prescriptions  Medication Sig Dispense Refill  . atorvastatin (LIPITOR) 20 MG tablet TAKE ONE (1) TABLET BY MOUTH EVERY DAY 90 tablet 2  . carvedilol (COREG) 25 MG tablet Take 1 tablet (25 mg total) by mouth 2 (two) times daily with a meal. 60 tablet 7  . clopidogrel (PLAVIX) 75 MG tablet TAKE ONE (1) TABLET EACH DAY 30 tablet 5  . diclofenac sodium (VOLTAREN) 1 % GEL Apply 2 g topically 4 (four) times daily as needed (knee pain).    Marland Kitchen empagliflozin (JARDIANCE) 10 MG TABS tablet Take 10 mg by mouth daily. 30 tablet 5  . fish oil-omega-3 fatty acids 1000 MG capsule Take 2 g by mouth daily.     . furosemide (LASIX) 40 MG tablet Take 40 mg by mouth daily.    Marland Kitchen glipiZIDE (GLUCOTROL) 10 MG tablet Take 10 mg by mouth 2 (two) times daily before a meal.    . glucosamine-chondroitin 500-400 MG tablet Take 1 tablet by mouth daily as needed (knee flare up or pain).    . isosorbide mononitrate (IMDUR) 60  MG 24 hr tablet TAKE ONE (1) TABLET BY MOUTH EVERY DAY 30 tablet 6  . mesalamine (APRISO) 0.375 g 24 hr capsule Take 4 capsules (1.5 g total) by mouth daily. 120 capsule 11  . metFORMIN (GLUCOPHAGE) 1000 MG tablet TAKE ONE TABLET TWICE DAILY WITH A MEAL. 180 tablet 0  . olmesartan (BENICAR) 40 MG tablet Take 1 tablet (40 mg total) by mouth daily. 90 tablet 3  . potassium chloride SA (K-DUR,KLOR-CON) 20 MEQ tablet TAKE ONE (1) TABLET BY MOUTH EVERY DAY 90 tablet 3  . PRADAXA 150 MG CAPS capsule TAKE ONE TABLET BY MOUTH TWICE A DAY 60 capsule 8  .  sitaGLIPtin (JANUVIA) 100 MG tablet Take 100 mg by mouth daily.    Marland Kitchen spironolactone (ALDACTONE) 25 MG tablet TAKE ONE-HALF TABLET BY MOUTH ONCE A DAY 45 tablet 2  . SYNTHROID 50 MCG tablet TAKE 1 TABLET DAILY BEFORE BREAKFAST 90 tablet 3   No current facility-administered medications for this visit.     Allergies:   Patient has no known allergies.   Social History:  The patient  reports that he quit smoking about 51 years ago. His smoking use included Cigarettes. He has a 5.00 pack-year smoking history. He has never used smokeless tobacco. He reports that he does not drink alcohol or use drugs.   Family History:  The patient's  family history includes Heart attack in his mother; Heart disease in his father; Hypertension in his brother; Obesity in his brother; Stomach cancer in his maternal aunt and paternal grandmother.    ROS:  Please see the history of present illness.  Otherwise, review of systems is positive for fatigue.  All other systems are reviewed and negative.    PHYSICAL EXAM: VS:  BP 128/78   Pulse 79   Ht 5' 8.5" (1.74 m)   Wt 282 lb 4 oz (128 kg)   SpO2 98%   BMI 42.29 kg/m  , BMI Body mass index is 42.29 kg/m. GEN: Well nourished, well developed, pleasant obese male in no acute distress  HEENT: normal  Neck: no JVD, no masses. No carotid bruits Cardiac: irregularly irregular without murmur or gallop      Respiratory:  clear to auscultation bilaterally, normal work of breathing GI: soft, nontender, nondistended, + BS MS: no deformity or atrophy  Ext: trace pretibial edema, pedal pulses 2+= bilaterally Skin: warm and dry, no rash Neuro:  Strength and sensation are intact Psych: euthymic mood, full affect  EKG:  EKG is ordered today. The ekg ordered today shows atrial fibrillation 84 bpm, nonspecific IVCD, ST and T-wave abnormality consider lateral ischemia.  Recent Labs: 10/19/2016: ALT 11; BUN 16; Creatinine, Ser 1.09; Potassium 4.9; Sodium 139   Lipid Panel      Component Value Date/Time   CHOL 88 (L) 10/19/2016 0942   TRIG 232 (H) 10/19/2016 0942   HDL 42 10/19/2016 0942   CHOLHDL 2.1 10/19/2016 0942   CHOLHDL 1.7 10/25/2015 0518   VLDL 16 10/25/2015 0518   LDLCALC Comment (A) 10/19/2016 0942   LDLDIRECT 12.0 08/11/2016 1401      Wt Readings from Last 3 Encounters:  04/20/17 282 lb 4 oz (128 kg)  03/10/17 282 lb (127.9 kg)  02/06/17 283 lb 2 oz (128.4 kg)     Cardiac Studies Reviewed: Echo 09-29-2016: Study Conclusions  - Procedure narrative: Transthoracic echocardiography for   diagnosis, for left ventricular function evaluation, for right   ventricular function evaluation, and for assessment of valvular  function. Image quality was adequate. The study was technically   difficult, as a result of poor sound wave transmission.   Intravenous contrast (Definity) was administered to opacify the   LV. - Left ventricle: The cavity size was normal. There was severe   concentric hypertrophy. Systolic function was mildly to   moderately reduced. The estimated ejection fraction was in the   range of 40% to 45%. Anteroseptal and anteroapical hypokinesis.   The study is not technically sufficient to allow evaluation of LV   diastolic function. - Mitral valve: Mildly thickened leaflets . There was trivial   regurgitation. - Left atrium: Moderately dilated. - Inferior vena cava: The vessel was normal in size. The   respirophasic diameter changes were in the normal range (>= 50%),   consistent with normal central venous pressure.  Impressions:  - Technically difficult study. Definity contrast given. LVEF   40-45%, anteroseptal and anteroapical hypokinesis, moderate LAE.   Compared to a prior study in 10/2015, the LVEF is slightly   improved.   ASSESSMENT AND PLAN: 1.  Chronic combined systolic and diastolic heart failure: NYHA II sx's at present. Overall appears stable. LV function was improved on his most recent echocardiogram as  outlined above. He will continue on his current medical program which includes furosemide, isosorbide, carvedilol, Benicar, and spironolactone.  2. Coronary artery disease, native vessel, without angina: The patient is doing well and will continue on clopidogrel long-term.  3. Permanent atrial fibrillation: Tolerating anticoagulation with Pradaxa. Heart rate is controlled.  4. Hyperlipidemia: Continue atorvastatin. The patient has an unusual lipid pattern with a negative LDL cholesterol.  5. Type 2 diabetes: followed by Dr Renne Crigler. Managed with multiple oral hypoglycemics. Lifestyle modification reviewed.   Current medicines are reviewed with the patient today.  The patient does not have concerns regarding medicines.  Labs/ tests ordered today include:   Orders Placed This Encounter  Procedures  . EKG 12-Lead    Disposition:   FU 6 months  Signed, Sherren Mocha, MD  04/20/2017 5:43 PM    [facility] [address], [place], [place]  [address] Phone: [phone]; Fax: [phone]

## 2017-04-20 NOTE — Patient Instructions (Signed)
Medication Instructions:  Your physician recommends that you continue on your current medications as directed. Please refer to the Current Medication list given to you today.  Labwork: No new orders.   Testing/Procedures: No new orders.   Follow-Up: Your physician wants you to follow-up in: 6 MONTHS with Dr Burt Knack. You will receive a reminder letter in the mail two months in advance. If you don't receive a letter, please call our office to schedule the follow-up appointment.    Any Other Special Instructions Will Be Listed Below (If Applicable).     If you need a refill on your cardiac medications before your next appointment, please call your pharmacy.

## 2017-05-05 ENCOUNTER — Other Ambulatory Visit: Payer: Self-pay | Admitting: Cardiovascular Disease

## 2017-05-05 MED ORDER — SPIRONOLACTONE 25 MG PO TABS: ORAL_TABLET | ORAL | 3 refills | Status: DC

## 2017-05-05 MED ORDER — ISOSORBIDE MONONITRATE ER 60 MG PO TB24: ORAL_TABLET | ORAL | 3 refills | Status: DC

## 2017-05-08 ENCOUNTER — Other Ambulatory Visit: Payer: Self-pay

## 2017-05-08 MED ORDER — CLOPIDOGREL BISULFATE 75 MG PO TABS: ORAL_TABLET | ORAL | 1 refills | Status: DC

## 2017-05-08 MED ORDER — OLMESARTAN MEDOXOMIL 40 MG PO TABS: 40.0000 mg | ORAL_TABLET | Freq: Every day | ORAL | 3 refills | Status: DC

## 2017-05-08 MED ORDER — CLOPIDOGREL BISULFATE 75 MG PO TABS: ORAL_TABLET | ORAL | 5 refills | Status: DC

## 2017-05-11 ENCOUNTER — Other Ambulatory Visit: Payer: Self-pay | Admitting: Cardiovascular Disease

## 2017-05-12 NOTE — Telephone Encounter (Signed)
Request received for Pradaxa 153m; pt is 71yrs old, Wt-128kg, Crea-1.09 on 10/19/16, Last seen by Dr. CBurt Knackon 04/20/17, CrCl-112.556mmin. Will send in refill request.

## 2017-05-26 ENCOUNTER — Other Ambulatory Visit: Payer: Self-pay | Admitting: Cardiovascular Disease

## 2017-05-26 NOTE — Telephone Encounter (Signed)
Medication Detail    Disp Refills Start End   isosorbide mononitrate (IMDUR) 60 MG 24 hr tablet 90 tablet 3 05/05/2017    Sig: TAKE ONE (1) TABLET BY MOUTH EVERY DAY   Sent to pharmacy as: isosorbide mononitrate (IMDUR) 60 MG 24 hr tablet   E-Prescribing Status: Receipt confirmed by pharmacy (05/05/2017 4:56 PM EDT)   Pharmacy   [place], [place]

## 2017-05-27 ENCOUNTER — Other Ambulatory Visit: Payer: Self-pay | Admitting: Internal Medicine

## 2017-05-27 ENCOUNTER — Other Ambulatory Visit: Payer: Self-pay | Admitting: Cardiovascular Disease

## 2017-06-13 ENCOUNTER — Encounter: Payer: Self-pay | Admitting: Family Medicine

## 2017-06-13 ENCOUNTER — Ambulatory Visit (INDEPENDENT_AMBULATORY_CARE_PROVIDER_SITE_OTHER): Payer: Medicare Other | Admitting: Family Medicine

## 2017-06-13 VITALS — BP 128/72 | HR 93 | Temp 98.2°F | Ht 68.5 in | Wt 283.3 lb

## 2017-06-13 DIAGNOSIS — E039 Hypothyroidism, unspecified: Secondary | ICD-10-CM | POA: Diagnosis not present

## 2017-06-13 DIAGNOSIS — E785 Hyperlipidemia, unspecified: Secondary | ICD-10-CM | POA: Diagnosis not present

## 2017-06-13 DIAGNOSIS — Z23 Encounter for immunization: Secondary | ICD-10-CM | POA: Diagnosis not present

## 2017-06-13 DIAGNOSIS — I10 Essential (primary) hypertension: Secondary | ICD-10-CM

## 2017-06-13 DIAGNOSIS — I5022 Chronic systolic (congestive) heart failure: Secondary | ICD-10-CM | POA: Diagnosis not present

## 2017-06-13 DIAGNOSIS — I4821 Permanent atrial fibrillation: Secondary | ICD-10-CM

## 2017-06-13 DIAGNOSIS — I482 Chronic atrial fibrillation: Secondary | ICD-10-CM

## 2017-06-13 LAB — COMPREHENSIVE METABOLIC PANEL
ALT: 12 U/L (ref 0–53)
AST: 18 U/L (ref 0–37)
Albumin: 4 g/dL (ref 3.5–5.2)
Alkaline Phosphatase: 78 U/L (ref 39–117)
BUN: 12 mg/dL (ref 6–23)
CHLORIDE: 104 meq/L (ref 96–112)
CO2: 29 meq/L (ref 19–32)
CREATININE: 1.07 mg/dL (ref 0.40–1.50)
Calcium: 9.2 mg/dL (ref 8.4–10.5)
GFR: 72.33 mL/min (ref >60.00)
Glucose, Bld: 147 mg/dL — ABNORMAL HIGH (ref 70–99)
Potassium: 4.4 mEq/L (ref 3.5–5.1)
SODIUM: 142 meq/L (ref 135–145)
Total Bilirubin: 0.6 mg/dL (ref 0.2–1.2)
Total Protein: 6.2 g/dL (ref 6.0–8.3)

## 2017-06-13 LAB — CBC
HCT: 43 % (ref 39.0–52.0)
Hemoglobin: 14.3 g/dL (ref 13.0–17.0)
MCHC: 33.3 g/dL (ref 30.0–36.0)
MCV: 84.1 fl (ref 78.0–100.0)
Platelets: 169 10*3/uL (ref 150.0–400.0)
RBC: 5.11 Mil/uL (ref 4.22–5.81)
RDW: 15.7 % — ABNORMAL HIGH (ref 11.5–15.5)
WBC: 9.2 10*3/uL (ref 4.0–10.5)

## 2017-06-13 LAB — TSH: TSH: 0.86 u[IU]/mL (ref 0.35–4.50)

## 2017-06-13 NOTE — Assessment & Plan Note (Signed)
edema stable on spironolactone 12.34m and lasix 451m  SOB when really pushes himself only. Weight down 4 lbs from last visit with me. Also on coreg and olmesartan.

## 2017-06-13 NOTE — Progress Notes (Signed)
Subjective:  [name] is a 71 y.o. year old very pleasant male patient who presents for/with See problem oriented charting ROS- baseline SOB, no chest pain. Stable trace edema. No fever or chills. No cough.    Past Medical History-  Patient Active Problem List   Diagnosis Date Noted  . History of transient ischemic attack (TIA) 10/24/2015    Priority: High  . CHF (congestive heart failure), NYHA class II ([place]) 05/09/2014    Priority: High  . IBD (inflammatory bowel disease) 08/09/2011    Priority: High  . Atrial fibrillation ([place]) 12/08/2010    Priority: High  . Type II diabetes mellitus with peripheral circulatory disorder ([place]) 04/16/2007    Priority: High  . CAD (coronary artery disease), native coronary artery 04/12/2007    Priority: High  . Morbid obesity ([place]) 08/09/2011    Priority: Medium  . Hyperlipidemia 04/16/2007    Priority: Medium  . Hypothyroidism 04/12/2007    Priority: Medium  . Essential hypertension 04/12/2007    Priority: Medium  . OSA (obstructive sleep apnea) 04/12/2007    Priority: Medium  . Former smoker 08/11/2016    Priority: Low  . MCI (mild cognitive impairment) 01/19/2016    Priority: Low  . Osteoarthritis of left knee 02/09/2015    Priority: Low  . Allergic rhinitis 06/25/2012    Priority: Low  . GERD (gastroesophageal reflux disease) 08/09/2011    Priority: Low  . VITAMIN B12 DEFICIENCY 11/29/2010    Priority: Low    Medications- reviewed and updated Current Outpatient Prescriptions  Medication Sig Dispense Refill  . atorvastatin (LIPITOR) 20 MG tablet TAKE ONE (1) TABLET BY MOUTH EVERY DAY 90 tablet 2  . carvedilol (COREG) 25 MG tablet TAKE ONE TABLET TWICE DAILY WITH A MEAL 60 tablet 10  . clopidogrel (PLAVIX) 75 MG tablet TAKE ONE (1) TABLET EACH DAY 90 tablet 1  . diclofenac sodium (VOLTAREN) 1 % GEL Apply 2 g topically 4 (four) times daily as needed (knee pain).    Marland Kitchen empagliflozin (JARDIANCE) 10 MG TABS tablet Take 10 mg by  mouth daily. 30 tablet 5  . fish oil-omega-3 fatty acids 1000 MG capsule Take 2 g by mouth daily.     . furosemide (LASIX) 40 MG tablet Take 40 mg by mouth daily.    Marland Kitchen glipiZIDE (GLUCOTROL) 10 MG tablet Take 10 mg by mouth 2 (two) times daily before a meal.    . glucosamine-chondroitin 500-400 MG tablet Take 1 tablet by mouth daily as needed (knee flare up or pain).    . isosorbide mononitrate (IMDUR) 60 MG 24 hr tablet TAKE ONE (1) TABLET BY MOUTH EVERY DAY 90 tablet 3  . mesalamine (APRISO) 0.375 g 24 hr capsule Take 4 capsules (1.5 g total) by mouth daily. 120 capsule 11  . metFORMIN (GLUCOPHAGE) 1000 MG tablet TAKE ONE TABLET TWICE DAILY WITH A MEAL 180 tablet 1  . olmesartan (BENICAR) 40 MG tablet Take 1 tablet (40 mg total) by mouth daily. 90 tablet 3  . potassium chloride SA (K-DUR,KLOR-CON) 20 MEQ tablet TAKE ONE (1) TABLET BY MOUTH EVERY DAY 90 tablet 3  . PRADAXA 150 MG CAPS capsule TAKE ONE CAPSULE BY MOUTH TWICE A DAY 60 capsule 5  . sitaGLIPtin (JANUVIA) 100 MG tablet Take 100 mg by mouth daily.    Marland Kitchen spironolactone (ALDACTONE) 25 MG tablet TAKE ONE-HALF TABLET BY MOUTH ONCE A DAY 45 tablet 3  . SYNTHROID 50 MCG tablet TAKE 1 TABLET DAILY BEFORE BREAKFAST  90 tablet 3   No current facility-administered medications for this visit.     Objective: BP 128/72   Pulse 93   Temp 98.2 F (36.8 C) (Oral)   Ht 5' 8.5" (1.74 m)   Wt 283 lb 4.8 oz (128.5 kg)   SpO2 98%   BMI 42.45 kg/m  Gen: NAD, resting comfortably CV: irregularly irregular  Lungs: CTAB no crackles, wheeze, rhonchi Abdomen: soft/nontender/nondistended/normal bowel sounds. No rebound or guarding.  Ext: trace edema Skin: warm, dry  Assessment/Plan:  Hypothyroidism S: Lab Results  Component Value Date   TSH 1.66 06/29/2015   On thyroid medication-synthroid 50 mcg A/P: continue current rx- update tsh  Morbid obesity ([place]) Down 4 lbs from last year. Encouraged continued weight loss efforts.   Essential  hypertension S: controlled on Spironolactone 12.64m, lasix 431m benicar 4049mcoreg 35m61mD , imdur 60mg34mst of these meds used primarily for cardiac conditions but considering SBP still 128- likely does also represent hypertension BP Readings from Last 3 Encounters:  06/13/17 128/72  04/20/17 128/78  03/10/17 (!) 152/86  A/P: We discussed blood pressure goal of <140/90. Continue current meds   Hyperlipidemia S:  controlled on atorvastatin 20mg.66mmyalgias.  A/P: update direct ldl today  Atrial fibrillation ([facility]) Rate control on coreg 35mg B61mPradaxa for anticoagulation  CHF (congestive heart failure), NYHA class II ([facility]) edema stable on spironolactone 12.5mg and20msix 40mg.  S83mhen really pushes himself only. Weight down 4 lbs from last visit with me. Also on coreg and olmesartan.   Future Appointments Date Time Provider [email or web address] 10:30 AM Gherghe, Philemon Kingdom-LBENDO None  08/03/2017 3:00 PM Sethi, PrGarvin FilaGNA None   Return in about 6 months (around 12/11/2017) for follow up- or sooner if needed.  Flu shot given- high dose Orders Placed This Encounter  Procedures  . CBC    [place]  . Comprehensive metabolic panel    [place]  . TSH    [place]  . LDL cholesterol, direct    [place]    Return precautions advised.  [name]

## 2017-06-13 NOTE — Assessment & Plan Note (Signed)
Rate control on coreg 47m BID. Pradaxa for anticoagulation

## 2017-06-13 NOTE — Addendum Note (Signed)
Addended by: Mariam Dollar, Roselyn Reef M on: 06/13/2017 02:52 PM   Modules accepted: Orders

## 2017-06-13 NOTE — Assessment & Plan Note (Signed)
S: Lab Results  Component Value Date   TSH 1.66 06/29/2015   On thyroid medication-synthroid 50 mcg A/P: continue current rx- update tsh

## 2017-06-13 NOTE — Assessment & Plan Note (Addendum)
S: controlled on Spironolactone 12.54m, lasix 471m benicar 4071mcoreg 21m12mD , imdur 60mg50mst of these meds used primarily for cardiac conditions but considering SBP still 128- likely does also represent hypertension BP Readings from Last 3 Encounters:  06/13/17 128/72  04/20/17 128/78  03/10/17 (!) 152/86  A/P: We discussed blood pressure goal of <140/90. Continue current meds

## 2017-06-13 NOTE — Assessment & Plan Note (Signed)
S:  controlled on atorvastatin 62m. No myalgias.  A/P: update direct ldl today

## 2017-06-13 NOTE — Patient Instructions (Addendum)
Flu shot today  Please stop by lab before you go  I would also like for you to sign up for an annual wellness visit with our nurse, Manuela Schwartz, who specializes in the annual wellness exam. This is a free benefit under medicare that may help [place] find additional ways to help you. Some highlights are reviewing medications, lifestyle, and doing a dementia screen.

## 2017-06-13 NOTE — Assessment & Plan Note (Signed)
Down 4 lbs from last year. Encouraged continued weight loss efforts.

## 2017-06-14 LAB — LDL CHOLESTEROL, DIRECT: Direct LDL: 18 mg/dL

## 2017-06-16 ENCOUNTER — Ambulatory Visit (INDEPENDENT_AMBULATORY_CARE_PROVIDER_SITE_OTHER): Payer: Medicare Other | Admitting: Internal Medicine

## 2017-06-16 ENCOUNTER — Encounter: Payer: Self-pay | Admitting: Internal Medicine

## 2017-06-16 ENCOUNTER — Ambulatory Visit: Payer: Medicare Other

## 2017-06-16 VITALS — BP 136/92 | HR 89 | Ht 68.5 in | Wt 283.4 lb

## 2017-06-16 DIAGNOSIS — E1151 Type 2 diabetes mellitus with diabetic peripheral angiopathy without gangrene: Secondary | ICD-10-CM | POA: Diagnosis not present

## 2017-06-16 DIAGNOSIS — Z6841 Body Mass Index (BMI) 40.0 and over, adult: Secondary | ICD-10-CM

## 2017-06-16 LAB — POCT GLYCOSYLATED HEMOGLOBIN (HGB A1C): HEMOGLOBIN A1C: 8.8

## 2017-06-16 NOTE — Patient Instructions (Addendum)
Please continue: - Glipizide 10 mg 2x a day - Metformin 1000 mg 2x a day - Januvia 100 mg daily  - Jardiance  10 mg daily every other day   Please stop at the lab.  Please return in 4 months with your sugar log.

## 2017-06-16 NOTE — Progress Notes (Addendum)
Patient ID: [name], male   DOB: January 31, 1946, 71 y.o.   MRN: [id number]  HPI: [name] is a 71 y.o.-year-old male, returning for DM2, dx 2004, non-insulin-dependent, uncontrolled, with complications (CAD, s/p MI). Last visit 4 mo ago. He has [facility] as supplemental and [employer] for meds in 10/2015.  Last hemoglobin A1c was: 12/08/2016: HbA1c calculated from the fructosamine is better, at 6.86%. 08/10/2016: HbA1c calculated from  fructosamine is 7.88% 05/10/2016: HbA1c calculated from fructosamine is 7.6% 01/06/2016: HbA1c calculated from fructosamine is 6.6% 09/06/2016: HbA1c calculated from fructosamine is 7.0% 06/01/2015: HbA1c calculated from fructosamine is 7.0% 02/23/2015: HbA1c calculated from fructosamine is 6.88% 10/15/2014: HbA1c calculated from fructosamine is 7.17% Lab Results  Component Value Date   HGBA1C 9.3 03/10/2017   HGBA1C 7.6 (H) 10/25/2015   HGBA1C 8.7 (H) 10/15/2014  He started to change his eating habits since 03/2013.   Pt is on a regimen of: - Glipizide 10 mg 2x a day - Metformin 1000 mg 2x a day - Januvia 100 mg daily    - Jardiance 10 mg daily in am >> increased urination >> now taking it every other day He does not miss doses.  Pt checks his sugars 4x a day: - am: 113-132 >> 107-132 >> 114-150 >> 106-140 - 2h after breakfast: 139-164 >> 126-150 >> 143-162 >> 132-162 - before lunch 144 >> 110-120s >> n/c  - before dinner: 95-110, 121 >> 95-119 >> 102-131 >> 108-129 - 2h after dinner: 143-175 >> 128-147, 157 >> 130-167 >> 139-157 No lows; he has hypoglycemia awareness in the 60s  His wife is a Radiographer, therapeutic >> eating sweets occasionally. He goes to [employer] place - 3x a week (MWF) - 1h/session: starting at 8-9 am..  He is [facility].  - No CKD, last BUN/creatinine:  Lab Results  Component Value Date   BUN 12 06/13/2017   CREATININE 1.07 06/13/2017  On Benicar 40. - last set of lipids: Lab  Results  Component Value Date   CHOL 88 (L) 10/19/2016   HDL 42 10/19/2016   LDLCALC Comment (A) 10/19/2016   LDLDIRECT 18.0 06/13/2017   TRIG 232 (H) 10/19/2016   CHOLHDL 2.1 10/19/2016  He was in the Reve[employer] Study x 4 years >> stopped. He is on omega 3 fatty acids. On Lipitor.  - last eye exam was 10/2016 >> No DR ([name]). He has cataracts - incipient.  - he denies numbness and tingling in his feet.  He also has a history of hypothyroidism, hyperlipidemia, OSA-on CPAP, very compliant, A. Fib-on Plavix and pradaxa, IBD, GERD, vitamin B12 deficiency - previously on im B12, obesity.  Last TSH: Lab Results  Component Value Date   TSH 0.86 06/13/2017  On LT4.  ROS: Constitutional: no weight gain/no weight loss, no fatigue, no subjective hyperthermia, no subjective hypothermia Eyes: no blurry vision, no xerophthalmia ENT: no sore throat, no nodules palpated in throat, no dysphagia, no odynophagia, no hoarseness Cardiovascular: + chronic CP/+ SOB/no palpitations/no leg swelling Respiratory: no cough/+ SOB/no wheezing Gastrointestinal: no N/no V/no D/no C/no acid reflux Musculoskeletal: no muscle aches/no joint aches Skin: no rashes, no hair loss Neurological: no tremors/no numbness/no tingling/no dizziness  I reviewed pt's medications, allergies, PMH, social hx, family hx, and changes were documented in the history of present illness. Otherwise, unchanged from my initial visit note.  PE: BP (!) 136/92   Pulse 89   Ht 5' 8.5" (1.74 m)  Wt 283 lb 6.4 oz (128.5 kg)   SpO2 97%   BMI 42.46 kg/m  Body mass index is 42.46 kg/m. Wt Readings from Last 3 Encounters:  06/16/17 283 lb 6.4 oz (128.5 kg)  06/13/17 283 lb 4.8 oz (128.5 kg)  04/20/17 282 lb 4 oz (128 kg)   Constitutional: overweight, in NAD Eyes: PERRLA, EOMI, no exophthalmos ENT: moist mucous membranes, no thyromegaly, no cervical lymphadenopathy Cardiovascular: RRR, No MRG, + LE edema - + wears compression  hoses Respiratory: CTA B Gastrointestinal: abdomen soft, NT, ND, BS+ Musculoskeletal: no deformities, strength intact in all 4 Skin: moist, warm, no rashes Neurological: no tremor with outstretched hands, DTR normal in all 4   ASSESSMENT: 1. DM2, non-insulin-dependent, uncontrolled, with complications - CAD, s/p MI 10/2006 - s/p stent - sees [name]  2. Obesity class 3 BMI Classification:  < 18.5 underweight   18.5-24.9 normal weight   25.0-29.9 overweight   30.0-34.9 class I obesity   35.0-39.9 class II obesity   ? 40.0 class III obesity   PLAN:  1. Patient with long-standing DM2, with fair control on oral antidiabetic regimen. At last visit, his sugars were higher due to the stress from losing his brother, however, they have improved since. We did not change the regimen at last visit. We did discuss then to reduce his coffee intake to only one cup a day. - He continues his Jardiance every other day because of polyuria. He is also on Lasix. - We reviewed together his last HbA1c, which was higher, at 9.1% and we did not check a fructosamine at that time. However, fructosamine from previous check was at goal, 6.86%. - I advised him to Patient Instructions  Please continue: - Glipizide 10 mg 2x a day - Metformin 1000 mg 2x a day - Januvia 100 mg daily  - Jardiance  10 mg daily every other day   Please stop at the lab.  Please return in 4 months with your sugar log.   - today, HbA1c is 8.8% (better than at last visit, but his HbA1c levels are not very accurate - we will check a new fructosamine today) - continue checking sugars at different times of the day - check 3x a day, rotating checks - advised for yearly eye exams >> he is UTD - Return to clinic in 4 mo with sugar log    2. Obesity class 3 - He did not lose weight since last visit and he absolutely needs to to improve his diabetes and his heart condition. He describes that he has chest pains with exertion and  was advised by his cardiologist to lose weight. - He is exercising 3 times a week, but we also discussed about the need to improve his diet, which is key.  Office Visit on 06/16/2017  Component Date Value Ref Range Status  . Hemoglobin A1C 06/16/2017 8.8   Final  . Fructosamine 06/16/2017 307* 0 - 285 umol/L Final   Comment: Published reference interval for apparently healthy subjects between age 8 and 66 is 40 - 285 umol/L and in a poorly controlled diabetic population is 228 - 563 umol/L with a mean of 396 umol/L.    HbA1c calculated from the fructosamine is stable, at 6.8%.   Philemon Kingdom, MD PhD [facility] Endocrinology

## 2017-06-17 LAB — FRUCTOSAMINE: FRUCTOSAMINE: 307 umol/L — AB (ref 0–285)

## 2017-08-03 ENCOUNTER — Ambulatory Visit (INDEPENDENT_AMBULATORY_CARE_PROVIDER_SITE_OTHER): Payer: Medicare Other | Admitting: Neurology

## 2017-08-03 ENCOUNTER — Encounter: Payer: Self-pay | Admitting: Neurology

## 2017-08-03 VITALS — BP 169/74 | HR 70 | Ht 68.5 in | Wt 288.8 lb

## 2017-08-03 DIAGNOSIS — I6990 Unspecified sequelae of unspecified cerebrovascular disease: Secondary | ICD-10-CM

## 2017-08-03 NOTE — Progress Notes (Signed)
[facility] [address] [place]. [place] [address] [phone]       OFFICE FOLLOW-UP NOTE  Mr. OSEPH [name] Date of Birth:  March 16, 1946 Medical Record Number:  [id number]   HPI:69 year Caucasian male seen today for first office f/u visit after [facility] admission for TIA in January 2017. He is accompanied by his granddaughter. [name] is an 71 y.o. male with a past medical history significant for HTN, HLD, CAD, MI, ischemic cardiomyopathy, atrial fibrillation on pradaxa, OSA on CPAP, transferred to [facility] for TIA work up. Wife is at the bedside and stated that 2 days ago they were home watching TV when she noted that her husband was having trouble operating the remote control. She said that he was having a great deal of difficulty putting the channel that he wanted to watch and was " obviously confused, saying I can not make it to work properly". Wife also said that his speech was slurred and he was hesitant getting his words out. His grandson was home and he called EMS. By the time they arrive to [facility] the symptoms had subsided, and he estimate that the entire episode lasted about 2 hours off an on. He developed severe pain around the left eye and vomiting in the ED but denies vertigo, double vision, focal weakness or numbness, or visual disturbances. CT head while in the ED showed no acute abnormality and he was subsequently transferred to [facility] where he had MRI/MRA brain that showed no acute abnormality.  Total cholesterol 79, LDL 16, HDL 47, triglycerides 79.  TTE: EF 35-40%, no mural thrombus, left atrium mildly enlarged. He was last known well 11/21/22, time uncertain. Patient was not administered TPA secondary to symptoms resolved. The patient informs me that he may not have taken his Pradaxa regularly for a pre-few days prior to that episode. He still has done well since then and has not had any recurrent TIA or stroke symptoms. He is tolerating Pradaxa well without  bleeding or bruising. States her blood pressure is well controlled and today it is 134/53. Fasting sugars have been well. His last hemoglobin A1c was 7.63 months ago. He has been using his CPAP mask regularly for his sleep apnea. The patient is concerned about short-term memory difficulties that he has been having. He has not had any formal testing or treatment for this. Update 07/20/2016 : He returns for follow-up after last visit 6 months ago. He continues to do well and states is at no recurrent TIA stroke symptoms. He also states his memory difficulties appeared to be unchanged. He did not notice any significant progressive cognitive impairment. He does salt sudoku and crossword puzzles. He is tolerating Plavix well without significant bruising or bleeding. He states her blood pressure is well controlled though it is slightly elevated today at 154/91. He is tolerating Lipitor well without muscle aches and pains. He states his fasting sugars have all been well controlled. Update 08/03/2017 : he returns for follow-up after last visit a year ago. She continues to do well and has not had any stroke or TIA symptoms for couple of years. He remains on Pradaxa which is tolerating well with only minor bruising but no bleeding episodes. Tolerating Lipitor without muscle aches and pains. States his lipid profile when last checked was satisfactory but he does have a follow-up visit with his primary physician next month to check it again. His fasting sugars are slightly high in the 140 range the last hemoglobin A1c  was satisfactory at 6.5. He does participate in cardiac rehabilitation 3 days a week but yet seems to have gained some weight and plans to lose. Blood pressure is running high oxalate difficult to control. He states his short-term memory difficulties unchanged. ROS:   14 system review of systems is positive for  memory difficulties,   Ringing in the ears, light sensitivity, shortness of breath, easy bruising  and all other systems negative  PMH:  Past Medical History:  Diagnosis Date  . Atrial fibrillation ([place])    initial diagnoses 2012  . B12 deficiency    per patient previously taking shots  . CORONARY ARTERY DISEASE 04/12/2007   2 stents last in 2006.   Marland Kitchen DIABETES MELLITUS, TYPE II 04/16/2007  . Diverticulosis of colon (without mention of hemorrhage)   . HYPERLIPIDEMIA 04/16/2007   reveal study. not sure of medication  . HYPERTENSION 04/12/2007  . HYPOTHYROIDISM 04/12/2007  . Ischemic cardiomyopathy    cath 35-40%  . MYOCARDIAL INFARCTION, HX OF 04/12/2007  . OBESITY 06/16/2009  . SLEEP APNEA 04/12/2007   CPAP  . Stroke ([place])   . ULCERATIVE COLITIS, LEFT SIDED 11/23/2010    Social History:  Social History   Social History  . Marital status: Married    Spouse name: N/A  . Number of children: 1  . Years of education: N/A   Occupational History  . RETIRED Retired   Social History Main Topics  . Smoking status: Former Smoker    Packs/day: 1.00    Years: 5.00    Types: Cigarettes    Quit date: 04/13/1966  . Smokeless tobacco: Never Used  . Alcohol use No     Comment: no more beer  . Drug use: No  . Sexual activity: Not on file   Other Topics Concern  . Not on file   Social History Narrative   Cardiorehab 3 days a week. 45 minutes to an hour-stationary bike.    GRANDDAUGHTER (MS. 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       Married for 37 years in 2015, married previously for 14 years. Daughter with first wife and 3 grandkids.    Lives alone with wife. Get to see grandkids a lot. New grandchild in middle of 36      Hobbies-yardwork, previously liked to hunt and fish, does some target shooting    Medications:   Current Outpatient Prescriptions on File Prior to Visit  Medication Sig Dispense Refill  . atorvastatin (LIPITOR) 20 MG tablet TAKE ONE (1) TABLET BY MOUTH EVERY  DAY 90 tablet 2  . carvedilol (COREG) 25 MG tablet TAKE ONE TABLET TWICE DAILY WITH A MEAL 60 tablet 10  . clopidogrel (PLAVIX) 75 MG tablet TAKE ONE (1) TABLET EACH DAY 90 tablet 1  . diclofenac sodium (VOLTAREN) 1 % GEL Apply 2 g topically 4 (four) times daily as needed (knee pain).    Marland Kitchen empagliflozin (JARDIANCE) 10 MG TABS tablet Take 10 mg by mouth daily. 30 tablet 5  . fish oil-omega-3 fatty acids 1000 MG capsule Take 2 g by mouth daily.     . furosemide (LASIX) 40 MG tablet Take 40 mg by mouth daily.    Marland Kitchen glipiZIDE (GLUCOTROL) 10 MG tablet Take 10 mg by mouth 2 (two) times daily before a meal.    . glucosamine-chondroitin 500-400 MG tablet Take 1  tablet by mouth daily as needed (knee flare up or pain).    . isosorbide mononitrate (IMDUR) 60 MG 24 hr tablet TAKE ONE (1) TABLET BY MOUTH EVERY DAY 90 tablet 3  . mesalamine (APRISO) 0.375 g 24 hr capsule Take 4 capsules (1.5 g total) by mouth daily. 120 capsule 11  . metFORMIN (GLUCOPHAGE) 1000 MG tablet TAKE ONE TABLET TWICE DAILY WITH A MEAL 180 tablet 1  . olmesartan (BENICAR) 40 MG tablet Take 1 tablet (40 mg total) by mouth daily. 90 tablet 3  . potassium chloride SA (K-DUR,KLOR-CON) 20 MEQ tablet TAKE ONE (1) TABLET BY MOUTH EVERY DAY 90 tablet 3  . PRADAXA 150 MG CAPS capsule TAKE ONE CAPSULE BY MOUTH TWICE A DAY 60 capsule 5  . sitaGLIPtin (JANUVIA) 100 MG tablet Take 100 mg by mouth daily.    Marland Kitchen spironolactone (ALDACTONE) 25 MG tablet TAKE ONE-HALF TABLET BY MOUTH ONCE A DAY 45 tablet 3  . SYNTHROID 50 MCG tablet TAKE 1 TABLET DAILY BEFORE BREAKFAST 90 tablet 3   No current facility-administered medications on file prior to visit.     Allergies:  No Known Allergies  Physical Exam General: Obese elderly Caucasian male, seated, in no evident distress Head: head normocephalic and atraumatic.  Neck: supple with no carotid or supraclavicular bruits Cardiovascular: regular rate and rhythm, no murmurs Musculoskeletal: no  deformity Skin:  no rash/petichiae Vascular:  Normal pulses all extremities Vitals:   08/03/17 1454  BP: (!) 169/74  Pulse: 70   Neurologic Exam Mental Status: Awake and fully alert. Oriented to place and time. Recent and remote memory intact. Attention span, concentration and fund of knowledge appropriate. Mood and affect appropriate. Recall 3/3. Animal naming test 11. Clock drawing 4/4. Cranial Nerves: Fundoscopic exam reveals sharp disc margins. Pupils equal, briskly reactive to light. Extraocular movements full without nystagmus. Visual fields full to confrontation. Hearing intact. Facial sensation intact. Face, tongue, palate moves normally and symmetrically.  Motor: Normal bulk and tone. Normal strength in all tested extremity muscles. Sensory.: intact to touch ,pinprick .position and vibratory sensation.  Coordination: Rapid alternating movements normal in all extremities. Finger-to-nose and heel-to-shin performed accurately bilaterally. Gait and Station: Arises from chair without difficulty. Stance is normal. Gait demonstrates normal stride length and balance . Able to heel, toe and tandem walk without difficulty.  Reflexes: 1+ and symmetric. Toes downgoing.      ASSESSMENT: 35 year Caucasian male with TIA in January 2017 with multiple vascular risk factors of atrial fibrillation, diabetes, hypertension, hyperlipidemia, obesity, sleep apnea, coronary artery disease.Mild memory difficulties due to mild cognitive impairment which appears stable    PLAN: I had a long d/w patient about his remote stroke,atrial fibrillation risk for recurrent stroke/TIAs, personally independently reviewed imaging studies and stroke evaluation results and answered questions.Continue Pradaxa (dabigatran) twice a day  for secondary stroke prevention and maintain strict control of hypertension with blood pressure goal below 130/90, diabetes with hemoglobin A1c goal below 6.5% and lipids with LDL cholesterol  goal below 70 mg/dL. I also advised the patient to eat a healthy diet with plenty of whole grains, cereals, fruits and vegetables, exercise regularly and maintain ideal body weight  I encouraged him to continue participation in cognitively challenging activities like solving crossword puzzles, playing sudoku .greater than 50% time during this 25 minute visit was spent on counseling and coordination of care about his embolic stroke, atrial fibrillation and answered questions. He'll return for follow-up in the future only if necessary and no  routine scheduled appointment was made. Antony Contras, MD Note: This document was prepared with digital dictation and possible smart phrase technology. Any transcriptional errors that result from this process are unintentional

## 2017-08-03 NOTE — Patient Instructions (Signed)
I had a long d/w patient about his remote stroke,atrial fibrillation risk for recurrent stroke/TIAs, personally independently reviewed imaging studies and stroke evaluation results and answered questions.Continue Pradaxa (dabigatran) twice a day  for secondary stroke prevention and maintain strict control of hypertension with blood pressure goal below 130/90, diabetes with hemoglobin A1c goal below 6.5% and lipids with LDL cholesterol goal below 70 mg/dL. I also advised the patient to eat a healthy diet with plenty of whole grains, cereals, fruits and vegetables, exercise regularly and maintain ideal body weight  I encouraged him to continue participation in cognitively challenging activities like solving crossword puzzles, playing sudoku which. He'll return for follow-up in the future only if necessary and no routine scheduled appointment was made.

## 2017-08-04 ENCOUNTER — Other Ambulatory Visit: Payer: Self-pay | Admitting: Internal Medicine

## 2017-08-04 ENCOUNTER — Other Ambulatory Visit: Payer: Self-pay | Admitting: Cardiovascular Disease

## 2017-09-02 ENCOUNTER — Other Ambulatory Visit: Payer: Self-pay | Admitting: Internal Medicine

## 2017-10-09 ENCOUNTER — Other Ambulatory Visit: Payer: Self-pay | Admitting: Internal Medicine

## 2017-10-16 ENCOUNTER — Encounter: Payer: Self-pay | Admitting: Internal Medicine

## 2017-10-16 ENCOUNTER — Ambulatory Visit (INDEPENDENT_AMBULATORY_CARE_PROVIDER_SITE_OTHER): Payer: Medicare Other | Admitting: Internal Medicine

## 2017-10-16 VITALS — BP 136/80 | HR 84 | Ht 68.5 in | Wt 279.4 lb

## 2017-10-16 DIAGNOSIS — E785 Hyperlipidemia, unspecified: Secondary | ICD-10-CM | POA: Diagnosis not present

## 2017-10-16 DIAGNOSIS — E1151 Type 2 diabetes mellitus with diabetic peripheral angiopathy without gangrene: Secondary | ICD-10-CM | POA: Diagnosis not present

## 2017-10-16 NOTE — Patient Instructions (Signed)
Please continue: - Glipizide 10 mg 2x a day - Metformin 1000 mg 2x a day - Januvia 100 mg daily    - Jardiance 10 mg every other day in a.m.   Please stop at the lab.  Please return in 4 months with your sugar log.

## 2017-10-16 NOTE — Progress Notes (Signed)
Patient ID: [name], male   DOB: May 30, 1946, 72 y.o.   MRN: [id number]  HPI: [name] is a 72 y.o.-year-old male, returning for DM2, dx 2004, non-insulin-dependent, uncontrolled, with complications (CAD, s/p MI). Last visit 4 mo ago. He has [facility] as supplemental and [employer] [facility] for meds in 10/2015.  He lost 9 lbs since I last saw him.  He is staying more active and is eating more vegetables.  Last hemoglobin A1c was: 06/16/2017: HbA1c calculated from the fructosamine is stable, at 6.8%.  12/08/2016: HbA1c calculated from the fructosamine is better, at 6.86%. 08/10/2016: HbA1c calculated from  fructosamine is 7.88% 05/10/2016: HbA1c calculated from fructosamine is 7.6% 01/06/2016: HbA1c calculated from fructosamine is 6.6% 09/06/2016: HbA1c calculated from fructosamine is 7.0% 06/01/2015: HbA1c calculated from fructosamine is 7.0% 02/23/2015: HbA1c calculated from fructosamine is 6.88% 10/15/2014: HbA1c calculated from fructosamine is 7.17% Lab Results  Component Value Date   HGBA1C 8.8 06/16/2017   HGBA1C 9.3 03/10/2017   HGBA1C 7.6 (H) 10/25/2015  He started to change his eating habits since 03/2013.   Pt is on a regimen of: - Glipizide 10 mg 2x a day - Metformin 1000 mg 2x a day - Januvia 100 mg daily    - Jardiance 10 mg daily in am >> increased urination >> takes it every other day He does not miss doses.  Pt checks his sugars 4 times a day: - am: 113-132 >> 107-132 >> 114-150 >> 106-140 >> 109-137 - 2h after breakfast: 126-150 >> 143-162 >> 132-162 >> 141-174 - before lunch 144 >> 110-120s >> n/c  - before dinner: 95-119 >> 102-131 >> 108-129 >> 102-130 - 2h after dinner: 128-147, 157 >> 130-167 >> 139-157 >> 149-179 No lows; he has hypoglycemia awareness in the 60s  His wife is a Radiographer, therapeutic >> eating sweets occasionally. He goes to [facility] place - 3x a week (MWF) - 1h/session: starting at 8-9 am. He is seeing the nutritionist at the Cardiac  Rehab.  He is drinking a lot of diet cranberry juice.   -+ Mild CKD, last BUN/creatinine:  Lab Results  Component Value Date   BUN 12 06/13/2017   CREATININE 1.07 06/13/2017  On Benicar 40. -+ HL;  last set of lipids: Lab Results  Component Value Date   CHOL 88 (L) 10/19/2016   HDL 42 10/19/2016   LDLCALC Comment (A) 10/19/2016   LDLDIRECT 18.0 06/13/2017   TRIG 232 (H) 10/19/2016   CHOLHDL 2.1 10/19/2016  He was in the Reveal Study x 4 years >> stopped. He is on omega-3 fatty acids and Lipitor.  - last eye exam was 10/2016: No DR ([name]). He has cataracts -incipient.  - no numbness and tingling in his feet.  He also has a history of hypothyroidism, hyperlipidemia, OSA-on CPAP, very compliant, A. Fib-on Plavix and pradaxa, IBD, GERD, vitamin B12 deficiency - previously on im B12, obesity.  His hypothyroidism is controlled.  Last TSH normal: Lab Results  Component Value Date   TSH 0.86 06/13/2017  On levothyroxine.  ROS: Constitutional: + weight loss, no fatigue, no subjective hyperthermia, no subjective hypothermia Eyes: no blurry vision, no xerophthalmia ENT: no sore throat, no nodules palpated in throat, no dysphagia, no odynophagia, no hoarseness Cardiovascular: no CP/+ SOB/no palpitations/no leg swelling Respiratory: no cough/+ SOB/no wheezing Gastrointestinal: no N/no V/no D/no C/no acid reflux Musculoskeletal: no muscle aches/no joint aches Skin: no rashes, no hair loss Neurological: no tremors/no numbness/no tingling/no dizziness  I reviewed  pt's medications, allergies, PMH, social hx, family hx, and changes were documented in the history of present illness. Otherwise, unchanged from my initial visit note.  PE: BP 136/80   Pulse 84   Ht 5' 8.5" (1.74 m)   Wt 279 lb 6.4 oz (126.7 kg)   SpO2 97%   BMI 41.86 kg/m  Body mass index is 41.86 kg/m. Wt Readings from Last 3 Encounters:  10/16/17 279 lb 6.4 oz (126.7 kg)  08/03/17 288 lb 12.8 oz (131 kg)   06/16/17 283 lb 6.4 oz (128.5 kg)   Constitutional: overweight, in NAD Eyes: PERRLA, EOMI, no exophthalmos ENT: moist mucous membranes, no thyromegaly, no cervical lymphadenopathy Cardiovascular: RRR, No MRG, + LE edema - wears compression hoses Respiratory: CTA B Gastrointestinal: abdomen soft, NT, ND, BS+ Musculoskeletal: no deformities, strength intact in all 4 Skin: moist, warm, no rashes Neurological: no tremor with outstretched hands, DTR normal in all 4   ASSESSMENT: 1. DM2, non-insulin-dependent, uncontrolled, with complications - CAD, s/p MI 10/2006 - s/p stent - sees [name]  2. Obesity class 3 BMI Classification:  < 18.5 underweight   18.5-24.9 normal weight   25.0-29.9 overweight   30.0-34.9 class I obesity   35.0-39.9 class II obesity   ? 40.0 class III obesity   3. HL  PLAN:  1. Patient with long-standing type 2 diabetes, with fair control on only oral antidiabetic regimen.  We discussed at length at last visit about changes in his diet since he was determined to lose weight also at the direction of his cardiologist to improve his heart condition.  At this visit, he lost 9 pounds, despite the holidays.  We again discussed about further changing his diet and I congratulated him about his current weight loss.  Next step would be to reduce his eggs and diet drinks intake. - He continues his Jardiance every other day because of polyuria. He is also on Lasix. - His measured HbA1c levels are less accurate for him compared to HbA1c levels calculated from fructosamine - I advised him to Patient Instructions  Please continue: - Glipizide 10 mg 2x a day - Metformin 1000 mg 2x a day - Januvia 100 mg daily    - Jardiance 10 mg every other day in a.m.   Please stop at the lab.  Please return in 4 months with your sugar log.   - today will check his fructosamine - continue checking sugars at different times of the day - check 1x a day, rotating checks -  advised for yearly eye exams >> he is UTD, and has an appointment coming up soon - Return to clinic in 4 mo with sugar log     2. Obesity class 3 - he lost 9 lbs since last visit, despite the Holidays!  - the weight loss was also emphasized by his cardiologist over he summer, to help with his heart condition and his CP with exertion  3. HL -Reviewed latest lipid panel from 10/2016: Total cholesterol and LDL were very low, while triglycerides were high -He continues on Lipitor and omega-3 fatty acids.  No side effects.  Office Visit on 10/16/2017  Component Date Value Ref Range Status  . Fructosamine 10/16/2017 335* 190 - 270 umol/L Final   HbA1c calculated from fructosamine is: 7.3% (higher).  Philemon Kingdom, MD PhD [facility]

## 2017-10-17 LAB — FRUCTOSAMINE: Fructosamine: 335 umol/L — ABNORMAL HIGH (ref 190–270)

## 2017-10-30 ENCOUNTER — Telehealth: Payer: Self-pay | Admitting: Internal Medicine

## 2017-10-30 NOTE — Telephone Encounter (Signed)
Pharmacy called and need script sent in for pt Of onetouch Ultra test strips.     [place], [place]

## 2017-11-02 NOTE — Telephone Encounter (Signed)
[facility] is calling back about sending over a script for the onetouch ultra.  Please advise   Jonni Sanger with Pharmacy.  [phone]

## 2017-11-06 MED ORDER — GLUCOSE BLOOD VI STRP: ORAL_STRIP | 12 refills | Status: DC

## 2017-11-06 NOTE — Telephone Encounter (Signed)
Test strips have been sent

## 2017-11-07 DIAGNOSIS — Z7984 Long term (current) use of oral hypoglycemic drugs: Secondary | ICD-10-CM | POA: Diagnosis not present

## 2017-11-07 DIAGNOSIS — H2513 Age-related nuclear cataract, bilateral: Secondary | ICD-10-CM | POA: Diagnosis not present

## 2017-11-07 DIAGNOSIS — E119 Type 2 diabetes mellitus without complications: Secondary | ICD-10-CM | POA: Diagnosis not present

## 2017-11-07 DIAGNOSIS — H524 Presbyopia: Secondary | ICD-10-CM | POA: Diagnosis not present

## 2017-12-02 ENCOUNTER — Other Ambulatory Visit: Payer: Self-pay | Admitting: Cardiovascular Disease

## 2017-12-08 ENCOUNTER — Other Ambulatory Visit: Payer: Self-pay

## 2017-12-08 MED ORDER — SITAGLIPTIN PHOSPHATE 100 MG PO TABS: ORAL_TABLET | ORAL | 2 refills | Status: DC

## 2017-12-12 ENCOUNTER — Ambulatory Visit: Payer: Medicare Other | Admitting: Family Medicine

## 2017-12-12 ENCOUNTER — Ambulatory Visit: Payer: Medicare Other | Admitting: *Deleted

## 2017-12-15 ENCOUNTER — Other Ambulatory Visit: Payer: Self-pay | Admitting: Cardiovascular Disease

## 2017-12-18 NOTE — Telephone Encounter (Signed)
Pradaxa 122m refill request received; pt is 72yrs old, wt-126.7kg, Crea-1.07 on 06/13/17, last seen by Dr. Copper on 04/20/17, CrCl-113.414mmin; will send in refill to requested pharmacy, pt was due to see Dr. CoBurt Knack months after last visit, note placed on refill for pt to make an appt with Cardiologist.

## 2017-12-20 ENCOUNTER — Other Ambulatory Visit: Payer: Self-pay

## 2017-12-20 MED ORDER — SYNTHROID 50 MCG PO TABS: 50.0000 ug | ORAL_TABLET | Freq: Every day | ORAL | 3 refills | Status: DC

## 2017-12-29 ENCOUNTER — Telehealth: Payer: Self-pay | Admitting: Family Medicine

## 2017-12-29 ENCOUNTER — Other Ambulatory Visit: Payer: Self-pay | Admitting: Cardiovascular Disease

## 2017-12-29 ENCOUNTER — Other Ambulatory Visit: Payer: Self-pay | Admitting: Internal Medicine

## 2017-12-29 DIAGNOSIS — E785 Hyperlipidemia, unspecified: Secondary | ICD-10-CM

## 2017-12-29 NOTE — Telephone Encounter (Signed)
See note

## 2017-12-29 NOTE — Telephone Encounter (Signed)
Copied from [place] [id number]. Topic: Quick Communication - See Telephone Encounter >> Dec 29, 2017  3:18 PM Synthia Innocent wrote: CRM for notification. See Telephone encounter for: 12/29/17. olmesartan (BENICAR) 40 MG tablet is on back order, need a med change, please advise

## 2017-12-30 NOTE — Telephone Encounter (Signed)
Please send in telmisartan 38m for daily use #90 or #30 per patient preference. Please state- only take while olmesartan is on backorder. Do not remove olmesartan from med list.

## 2018-01-01 ENCOUNTER — Other Ambulatory Visit: Payer: Self-pay

## 2018-01-01 MED ORDER — TELMISARTAN 20 MG PO TABS: 20.0000 mg | ORAL_TABLET | Freq: Every day | ORAL | 1 refills | Status: DC

## 2018-01-01 NOTE — Telephone Encounter (Signed)
Prescription sent to pharmacy as requested.

## 2018-01-02 ENCOUNTER — Other Ambulatory Visit: Payer: Self-pay

## 2018-01-02 MED ORDER — TELMISARTAN 40 MG PO TABS: 20.0000 mg | ORAL_TABLET | Freq: Every day | ORAL | 1 refills | Status: DC

## 2018-01-02 NOTE — Telephone Encounter (Signed)
telmisartan 33m- have him cut the tablets in half.   SGarret Reddish

## 2018-01-02 NOTE — Telephone Encounter (Signed)
telmisartan 89m is not available, nationwide shortage likely due to recall on losartan and so many med changes. olmesartan is also on backorder. Losartan 20 mg is available. telmisartan 438mis available.   Please advise  AnJonni Sangerith ReRiverenoh# [phone]

## 2018-01-02 NOTE — Telephone Encounter (Signed)
Prescription sent to pharmacy as requested.

## 2018-01-10 ENCOUNTER — Other Ambulatory Visit: Payer: Self-pay | Admitting: Cardiovascular Disease

## 2018-01-10 ENCOUNTER — Encounter: Payer: Self-pay | Admitting: Sports Medicine

## 2018-01-10 ENCOUNTER — Ambulatory Visit: Payer: Self-pay

## 2018-01-10 ENCOUNTER — Ambulatory Visit (INDEPENDENT_AMBULATORY_CARE_PROVIDER_SITE_OTHER): Payer: Medicare Other

## 2018-01-10 ENCOUNTER — Ambulatory Visit (INDEPENDENT_AMBULATORY_CARE_PROVIDER_SITE_OTHER): Payer: Medicare Other | Admitting: Sports Medicine

## 2018-01-10 VITALS — BP 142/90 | HR 97 | Ht 68.5 in | Wt 283.4 lb

## 2018-01-10 DIAGNOSIS — M25562 Pain in left knee: Secondary | ICD-10-CM

## 2018-01-10 DIAGNOSIS — I25118 Atherosclerotic heart disease of native coronary artery with other forms of angina pectoris: Secondary | ICD-10-CM | POA: Diagnosis not present

## 2018-01-10 DIAGNOSIS — M1712 Unilateral primary osteoarthritis, left knee: Secondary | ICD-10-CM

## 2018-01-10 DIAGNOSIS — E785 Hyperlipidemia, unspecified: Secondary | ICD-10-CM

## 2018-01-10 NOTE — Patient Instructions (Signed)

## 2018-01-10 NOTE — Progress Notes (Signed)
[name]. [name], [place] [facility] [place]  [name] - 72 y.o. male MRN [id number]  Date of birth: 29-Apr-1946  Visit Date: 01/10/2018  PCP: Andrew Olp, MD   Referred by: Andrew Olp, MD  Scribe for today's visit: [name], CMA     SUBJECTIVE:  [name] is here for New Patient (Initial Visit) (Left leg/knee pain)  His Left leg/knee pain symptoms INITIALLY: Began 2-3 weeks ago. He was walking across the driveway when he felt and heard a pop in his L knee.  Described as mild at rest, moderate-severe with activity.Pain is described as sharp throbbing like a toothache, nonradiating Worsened with going from sit to stand, weight bearing, twisting/turning.  Improved with rest. Additional associated symptoms include: Initial injury was about 2-3 weeks ago, it improved, and then flared up again over the past few days. He has noticed some swelling around the knee. Knee pain is generalized, not specific area hurts.     At this time symptoms show no change compared to onset  He has been taking tylenol arthritis with some relief.   He has been treated at [facility] in the past, 2-3 years ago, and received steroid injection. They have discussed knee replacement but cardiologist (Dr Blane Ohara) was not in agreement.  No recent XR of L knee.     ROS Denies night time disturbances. Denies fevers, chills, or night sweats. Denies unexplained weight loss. Denies personal history of cancer. Denies changes in bowel or bladder habits. Denies recent unreported falls. Denies new or worsening dyspnea or wheezing. Denies headaches or dizziness.  Denies numbness, tingling or weakness  In the extremities.  Denies dizziness or presyncopal episodes Reports lower extremity edema    HISTORY & PERTINENT PRIOR DATA:  Prior History reviewed and updated per electronic medical record.  Significant/pertinent  history, findings, studies include:  reports that he quit smoking about 51 years ago. His smoking use included cigarettes. He has a 5.00 pack-year smoking history. He has never used smokeless tobacco. Recent Labs    03/10/17 1122 06/16/17 1014  HGBA1C 9.3 8.8   01/18/2018 - Zilretta injection - no PA required, deductible has been met, pt responsibility is 20%. - BSC No problems updated.  OBJECTIVE:  VS:  HT:5' 8.5" (174 cm)   WT:283 lb 6.4 oz (128.5 kg)  BMI:42.46    BP:(Abnormal) 142/90  HR:97bpm  TEMP: ( )  RESP:94 %   PHYSICAL EXAM: Constitutional: WDWN, Non-toxic appearing. Psychiatric: Alert & appropriately interactive.  Not depressed or anxious appearing. Respiratory: No increased work of breathing.  Trachea Midline Eyes: Pupils are equal.  EOM intact without nystagmus.  No scleral icterus  Vascular Exam: warm to touch no edema  lower extremity neuro exam: unremarkable  MSK Exam: Left knee: Large effusion.  Pain with flexion extension.  Stable varus valgus stressing.  Extensor mechanism intact.   ASSESSMENT & PLAN:   1. Left knee pain, unspecified chronicity   2. Primary osteoarthritis of left knee     PLAN: Aspiration injection performed today.  Follow-up 3 months for consideration of repeat injection per  Follow-up: Return in about 3 months (around 04/04/2018).      Please see additional documentation for Objective, Assessment and Plan sections. Pertinent additional documentation may be included in corresponding procedure notes, imaging studies, problem based documentation and patient instructions. Please see these sections of the encounter for additional information regarding this visit.  CMA/ATC served as Education administrator during this visit. History, Physical, and Plan performed by medical provider. Documentation and orders reviewed and attested to.      Gerda Diss, [facility] Physician

## 2018-01-10 NOTE — Telephone Encounter (Signed)
Order Providers   Prescribing Provider Encounter Provider  Sherren Mocha, MD Sherren Mocha, MD  Outpatient Medication Detail    Disp Refills Start End   atorvastatin (LIPITOR) 20 MG tablet 90 tablet 0 12/29/2017    Sig - Route: Take 1 tablet (20 mg total) by mouth daily. Please call and schedule an appointment for further refills 1st attempt - Oral   Sent to pharmacy as: atorvastatin (LIPITOR) 20 MG tablet   E-Prescribing Status: Receipt confirmed by pharmacy (12/29/2017 3:14 PM EDT)   Associated Diagnoses   Hyperlipidemia     Pharmacy   [place], [place]

## 2018-01-10 NOTE — Procedures (Signed)
X-Rays obtained at [facility] Interpreted by myself Gerda Diss, DO) during office visit.  Results were reviewed with the patient at the time of the visit.   3 VIEW X-RAY of: Left knee  FINDINGS:  I compartment with degenerative changes of the left knee with bone-on-bone wear patellofemoral and medial compartment.  No soft tissue findings. IMPRESSION:  End-stage degenerative osteoarthritis of the left knee.

## 2018-01-10 NOTE — Progress Notes (Signed)
PROCEDURE NOTE:  Ultrasound Guided: Injection: Left knee Images were obtained and interpreted by myself, Teresa Coombs, DO  Images have been saved and stored to PACS system. Images obtained on: GE S7 Ultrasound machine     DESCRIPTION OF PROCEDURE:  The patient's clinical condition is marked by substantial pain and/or significant functional disability. Other conservative therapy has not provided relief, is contraindicated, or not appropriate. There is a reasonable likelihood that injection will significantly improve the patient's pain and/or functional impairment.   After discussing the risks, benefits and expected outcomes of the injection and all questions were reviewed and answered, the patient wished to undergo the above named procedure.  Verbal consent was obtained.  The ultrasound was used to identify the target structure and adjacent neurovascular structures. The skin was then prepped in sterile fashion and the target structure was injected under direct visualization using sterile technique as below:  PREP: Alcohol, Ethel Chloride and 5 cc 1% lidocaine on 25g 1.5 in. needle APPROACH: superiolateral, stopcock technique, 21g 2 in. INJECTATE: 5 cc 1% lidocaine, 2 cc 0.5% Marcaine and 2 cc 55m/mL DepoMedrol ASPIRATE: 50cc serous fluid DRESSING: Band-Aid  Post procedural instructions including recommending icing and warning signs for infection were reviewed.    This procedure was well tolerated and there were no complications.   IMPRESSION: Succesful Ultrasound Guided: Injection

## 2018-01-12 ENCOUNTER — Other Ambulatory Visit: Payer: Self-pay

## 2018-01-12 DIAGNOSIS — E1151 Type 2 diabetes mellitus with diabetic peripheral angiopathy without gangrene: Secondary | ICD-10-CM

## 2018-01-12 MED ORDER — SITAGLIPTIN PHOSPHATE 100 MG PO TABS: ORAL_TABLET | ORAL | 2 refills | Status: DC

## 2018-01-12 MED ORDER — OLMESARTAN MEDOXOMIL 40 MG PO TABS: 40.0000 mg | ORAL_TABLET | Freq: Every day | ORAL | 3 refills | Status: DC

## 2018-01-22 ENCOUNTER — Encounter: Payer: Self-pay | Admitting: Family Medicine

## 2018-01-24 ENCOUNTER — Other Ambulatory Visit: Payer: Self-pay

## 2018-01-24 DIAGNOSIS — E1151 Type 2 diabetes mellitus with diabetic peripheral angiopathy without gangrene: Secondary | ICD-10-CM

## 2018-01-24 NOTE — Progress Notes (Deleted)
Subjective:   [name] is a 72 y.o. male who presents for Medicare Annual/Subsequent preventive examination.  Review of Systems:  Last seen 06/2017 Type 2 DM  Losing weight Cardio rehab x 5 years 316lb when he started Now 268 Has tried to watch his portions when he eats BMI 42  Still trying to lose weight  Exercise Cardio 3 days; pedal a new step x 20 minutes Goes to a rowing machine x 20 minutes   Eye exam 11/07/2017 Colonoscopy due 12/2020  Smoking with 5 pack years -states we can check for AAA   Cardiac Risk Factors include: advanced age (>8mn, >>50women);diabetes mellitus;dyslipidemia;family history of premature cardiovascular disease;male gender;hypertension;obesity (BMI >30kg/m2)Diet   Health Maintenance Due  Topic Date Due  . HEMOGLOBIN A1C  12/14/2017        Objective:    Vitals: BP 128/70   Pulse 75   Ht 5' 7"  (1.702 m)   Wt 268 lb (121.6 kg)   SpO2 96%   BMI 41.97 kg/m   Body mass index is 41.97 kg/m.  Advanced Directives 01/25/2018 10/25/2015  Does Patient Have a Medical Advance Directive? No No  Would patient like information on creating a medical advance directive? - No - patient declined information    Tobacco Social History   Tobacco Use  Smoking Status Former Smoker  . Packs/day: 1.00  . Years: 5.00  . Pack years: 5.00  . Types: Cigarettes  . Last attempt to quit: 04/13/1966  . Years since quitting: 51.8  Smokeless Tobacco Never Used     Counseling given: Yes   Clinical Intake:     Past Medical History:  Diagnosis Date  . Atrial fibrillation ([facility]    initial diagnoses 2012  . B12 deficiency    per patient previously taking shots  . CORONARY ARTERY DISEASE 04/12/2007   2 stents last in 2006.   .Marland KitchenDIABETES MELLITUS, TYPE II 04/16/2007  . Diverticulosis of colon (without mention of hemorrhage)   . HYPERLIPIDEMIA 04/16/2007   reveal study. not sure of medication  . HYPERTENSION 04/12/2007  . HYPOTHYROIDISM 04/12/2007  .  Ischemic cardiomyopathy    cath 35-40%  . MYOCARDIAL INFARCTION, HX OF 04/12/2007  . OBESITY 06/16/2009  . SLEEP APNEA 04/12/2007   CPAP  . Stroke ([place]   . ULCERATIVE COLITIS, LEFT SIDED 11/23/2010   Past Surgical History:  Procedure Laterality Date  . CARDIAC CATHETERIZATION  10/2002   STENT. 2 stents Dr. BMaurene Capes . CARDIAC CATHETERIZATION N/A 10/28/2015   Procedure: Right/Left Heart Cath and Coronary Angiography;  Surgeon: MSherren Mocha MD;  [facility];  Service: Cardiovascular;  Laterality: N/A;  . CORONARY STENT PLACEMENT  2008   LAD   . TONSILLECTOMY     Family History  Problem Relation Age of Onset  . Heart attack Mother        mid 735s . Heart disease Father        H/O CAD, CABG, VALVE SURGERY  . Hypertension Brother   . Obesity Brother   . Stomach cancer Maternal Aunt   . Stomach cancer Paternal Grandmother        ? colon    Social History   Socioeconomic History  . Marital status: Married    Spouse name: Not on file  . Number of children: 1  . Years of education: Not on file  . Highest education level: Not on file  Occupational History  . Occupation: RETIRED  Employer: RETIRED  Social Needs  . Financial resource strain: Not on file  . Food insecurity:    Worry: Not on file    Inability: Not on file  . Transportation needs:    Medical: Not on file    Non-medical: Not on file  Tobacco Use  . Smoking status: Former Smoker    Packs/day: 1.00    Years: 5.00    Pack years: 5.00    Types: Cigarettes    Last attempt to quit: 04/13/1966    Years since quitting: 51.8  . Smokeless tobacco: Never Used  Substance and Sexual Activity  . Alcohol use: No    Alcohol/week: 0.0 oz    Comment: no more beer  . Drug use: No  . Sexual activity: Not on file  Lifestyle  . Physical activity:    Days per week: Not on file    Minutes per session: Not on file  . Stress: Not on file  Relationships  . Social connections:    Talks on phone: Not on file     Gets together: Not on file    Attends religious service: Not on file    Active member of club or organization: Not on file    Attends meetings of clubs or organizations: Not on file    Relationship status: Not on file  Other Topics Concern  . Not on file  Social History Narrative   Cardiorehab 3 days a week. 45 minutes to an hour-stationary bike.    GRANDDAUGHTER (MS. 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       Married for 37 years in 2015, married previously for 14 years. Daughter with first wife and 3 grandkids.    Lives alone with wife. Get to see grandkids a lot. New grandchild in middle of 62      Hobbies-yardwork, previously liked to hunt and fish, does some target shooting    Outpatient Encounter Medications as of 01/25/2018  Medication Sig  . atorvastatin (LIPITOR) 20 MG tablet Take 1 tablet (20 mg total) by mouth daily. Please call and schedule an appointment for further refills 1st attempt  . carvedilol (COREG) 25 MG tablet TAKE ONE TABLET TWICE DAILY WITH A MEAL  . clopidogrel (PLAVIX) 75 MG tablet TAKE ONE (1) TABLET EACH DAY  . fish oil-omega-3 fatty acids 1000 MG capsule Take 2 g by mouth daily.   . furosemide (LASIX) 40 MG tablet TAKE ONE (1) TABLET BY MOUTH EVERY DAY  . glipiZIDE (GLUCOTROL) 10 MG tablet TAKE ONE TABLET TWICE DAILY  . glucosamine-chondroitin 500-400 MG tablet Take 1 tablet by mouth daily as needed (knee flare up or pain).  Marland Kitchen glucose blood test strip Use as instructed to test 1 time daily  . isosorbide mononitrate (IMDUR) 60 MG 24 hr tablet TAKE ONE (1) TABLET BY MOUTH EVERY DAY  . JARDIANCE 10 MG TABS tablet TAKE ONE TABLET BY MOUTH EVERY OTHER DAY  . mesalamine (APRISO) 0.375 g 24 hr capsule Take 4 capsules (1.5 g total) by mouth daily.  . metFORMIN (GLUCOPHAGE) 1000 MG tablet TAKE ONE TABLET TWICE DAILY WITH A MEAL  . olmesartan (BENICAR) 40 MG  tablet Take 1 tablet (40 mg total) by mouth daily.  . potassium chloride SA (K-DUR,KLOR-CON) 20 MEQ tablet TAKE ONE (1) TABLET BY MOUTH EVERY DAY  . PRADAXA 150 MG CAPS capsule  TAKE ONE CAPSULE BY MOUTH TWICE A DAY  . sitaGLIPtin (JANUVIA) 100 MG tablet TAKE ONE (1) TABLET EACH DAY  . spironolactone (ALDACTONE) 25 MG tablet TAKE ONE-HALF TABLET BY MOUTH ONCE A DAY  . SYNTHROID 50 MCG tablet Take 1 tablet (50 mcg total) by mouth daily before breakfast.  . diclofenac sodium (VOLTAREN) 1 % GEL Apply 2 g topically 4 (four) times daily as needed (knee pain).  Marland Kitchen telmisartan (MICARDIS) 40 MG tablet Take 0.5 tablets (20 mg total) by mouth daily.   No facility-administered encounter medications on file as of 01/25/2018.     Activities of Daily Living In your present state of health, do you have any difficulty performing the following activities: 01/25/2018  Hearing? N  Comment in normal conversation  Vision? N  Difficulty concentrating or making decisions? N  Walking or climbing stairs? Y  Dressing or bathing? N  Doing errands, shopping? N  Preparing Food and eating ? N  Using the Toilet? N  In the past six months, have you accidently leaked urine? N  Do you have problems with loss of bowel control? N  Comment colitis  Managing your Medications? N  Managing your Finances? N  Housekeeping or managing your Housekeeping? N  Some recent data might be hidden    Patient Care Team: Marin Olp, MD as PCP - General (Family Medicine)   Assessment:   This is a routine wellness examination for [facility].  Exercise Activities and Dietary recommendations Current Exercise Habits: Structured exercise class, Type of exercise: strength training/weights(aerobic equipment), Time (Minutes): 60, Frequency (Times/Week): 3, Weekly Exercise (Minutes/Week): 180, Intensity: Moderate  Goals    . Weight (lb) < 250 lb (113.4 kg)     Continue to watch when and how much you eat. Your exercise routine is  good Stay active through the week        Fall Risk Fall Risk  01/25/2018 08/03/2017 08/11/2016 07/20/2016 01/19/2016  Falls in the past year? No No No No No     Depression Screen PHQ 2/9 Scores 01/25/2018 08/11/2016 02/09/2015 04/02/2013  PHQ - 2 Score 0 0 0 0    Cognitive Function MMSE - Mini Mental State Exam 01/25/2018  Not completed: (No Data)   Ad8 score reviewed for issues:  Issues making decisions:  Less interest in hobbies / activities:  Repeats questions, stories (family complaining):  Trouble using ordinary gadgets (microwave, computer, phone):  Forgets the month or year:   Mismanaging finances:   Remembering appts:  Daily problems with thinking and/or memory: Ad8 score is=0          Immunization History  Administered Date(s) Administered  . Influenza Split 07/06/2011, 06/25/2012  . Influenza Whole 07/14/2008, 08/03/2009, 08/24/2010  . Influenza, High Dose Seasonal PF 08/10/2016, 06/13/2017  . Influenza,inj,Quad PF,6+ Mos 06/03/2013, 07/11/2014, 06/29/2015  . Pneumococcal Conjugate-13 02/09/2015  . Pneumococcal Polysaccharide-23 07/06/2011  . Td 09/22/2010  . Zoster 07/19/2011      Screening Tests Health Maintenance  Topic Date Due  . HEMOGLOBIN A1C  12/14/2017  . Hepatitis C Screening  10/14/2098 (Originally [birth year])  . INFLUENZA VACCINE  05/10/2018  . OPHTHALMOLOGY EXAM  11/07/2018  . FOOT EXAM  01/26/2019  . TETANUS/TDAP  09/22/2020  . COLONOSCOPY  12/17/2020  . PNA vac Low Risk Adult  Completed        Plan:      PCP Notes   Health Maintenance Recommended podiatry; not sure he will go Given information on foot care (states he  has athlete foot and has this frequently; to get treatment )  Agreed to AAA due to smoking hx     Abnormal Screens  Hearing 1000 hz both ears Given info on [facility]  Referrals  None today; did not show an interest in podiatry    Patient concerns; None voiced   Nurse Concerns; As noted  Next  PCP apt today      I have personally reviewed and noted the following in the patient's chart:   . Medical and social history . Use of alcohol, tobacco or illicit drugs  . Current medications and supplements . Functional ability and status . Nutritional status . Physical activity . Advanced directives . List of other physicians . Hospitalizations, surgeries, and ER visits in previous 12 months . Vitals . Screenings to include cognitive, depression, and falls . Referrals and appointments  In addition, I have reviewed and discussed with patient certain preventive protocols, quality metrics, and best practice recommendations. A written personalized care plan for preventive services as well as general preventive health recommendations were provided to patient.     Wynetta Fines, RN  01/25/2018

## 2018-01-25 ENCOUNTER — Encounter: Payer: Self-pay | Admitting: *Deleted

## 2018-01-25 ENCOUNTER — Encounter: Payer: Self-pay | Admitting: Family Medicine

## 2018-01-25 ENCOUNTER — Ambulatory Visit (INDEPENDENT_AMBULATORY_CARE_PROVIDER_SITE_OTHER): Payer: Medicare Other | Admitting: *Deleted

## 2018-01-25 ENCOUNTER — Ambulatory Visit (INDEPENDENT_AMBULATORY_CARE_PROVIDER_SITE_OTHER): Payer: Medicare Other | Admitting: Family Medicine

## 2018-01-25 VITALS — BP 128/70 | HR 75 | Ht 67.0 in | Wt 268.0 lb

## 2018-01-25 VITALS — BP 128/70 | Ht 67.0 in | Wt 268.4 lb

## 2018-01-25 DIAGNOSIS — Z87891 Personal history of nicotine dependence: Secondary | ICD-10-CM | POA: Diagnosis not present

## 2018-01-25 DIAGNOSIS — K5100 Ulcerative (chronic) pancolitis without complications: Secondary | ICD-10-CM | POA: Diagnosis not present

## 2018-01-25 DIAGNOSIS — I5022 Chronic systolic (congestive) heart failure: Secondary | ICD-10-CM

## 2018-01-25 DIAGNOSIS — I10 Essential (primary) hypertension: Secondary | ICD-10-CM

## 2018-01-25 DIAGNOSIS — I482 Chronic atrial fibrillation: Secondary | ICD-10-CM

## 2018-01-25 DIAGNOSIS — Z0000 Encounter for general adult medical examination without abnormal findings: Secondary | ICD-10-CM | POA: Diagnosis not present

## 2018-01-25 DIAGNOSIS — E039 Hypothyroidism, unspecified: Secondary | ICD-10-CM | POA: Diagnosis not present

## 2018-01-25 DIAGNOSIS — I4821 Permanent atrial fibrillation: Secondary | ICD-10-CM

## 2018-01-25 DIAGNOSIS — E785 Hyperlipidemia, unspecified: Secondary | ICD-10-CM | POA: Diagnosis not present

## 2018-01-25 MED ORDER — KETOCONAZOLE 2 % EX CREA: 1.0000 "application " | TOPICAL_CREAM | Freq: Every day | CUTANEOUS | 0 refills | Status: DC

## 2018-01-25 NOTE — Assessment & Plan Note (Signed)
Remains on mesalamine and follows with GI. Encouraged follow up with Dr. Fuller Plan

## 2018-01-25 NOTE — Patient Instructions (Addendum)
[name] , Thank you for taking time to come for your Medicare Wellness Visit. I appreciate your ongoing commitment to your health goals. Please review the following plan we discussed and let me know if I can assist you in the future.   Have your wife check your feet after you shower  Put cream on your feet, be sure the area inside your toes are dry.  A podiatrist if you wish   May have a hearing screen if you like [facility] - can assist with hearing aid x 1  No reviews  [facility]  [phone]  [phone] [name]  [email or web address]   These are the goals we discussed: Goals    . Weight (lb) < 250 lb (113.4 kg)     Continue to watch when and how much you eat. Your exercise routine is good Stay active through the week        This is a list of the screening recommended for you and due dates:  Health Maintenance  Topic Date Due  . Complete foot exam   08/11/2017  . Hemoglobin A1C  12/14/2017  .  Hepatitis C: One time screening is recommended by Center for Disease Control  (CDC) for  adults born from [birth year] through 1965.   10/14/2098*  . Flu Shot  05/10/2018  . Eye exam for diabetics  11/07/2018  . Tetanus Vaccine  09/22/2020  . Colon Cancer Screening  12/17/2020  . Pneumonia vaccines  Completed  *Topic was postponed. The date shown is not the original due date.    Diabetes and Foot Care Diabetes may cause you to have problems because of poor blood supply (circulation) to your feet and legs. This may cause the skin on your feet to become thinner, break easier, and heal more slowly. Your skin may become dry, and the skin may peel and crack. You may also have nerve damage in your legs and feet causing decreased feeling in them. You may not notice minor injuries to your feet that could lead to infections or more serious problems.  Taking care of your feet is one of the most important things you can do for yourself. Follow these instructions at home:  Wear shoes at all times, even in the house. Do not go barefoot. Bare feet are easily injured.  Check your feet daily for blisters, cuts, and redness. If you cannot see the bottom of your feet, use a mirror or ask someone for help.  Wash your feet with warm water (do not use hot water) and mild soap. Then pat your feet and the areas between your toes until they are completely dry. Do not soak your feet as this can dry your skin.  Apply a moisturizing lotion or petroleum jelly (that does not contain alcohol and is unscented) to the skin on your feet and to dry, brittle toenails. Do not apply lotion between your toes.  Trim your toenails straight across. Do not dig under them or around the cuticle. File the edges of your nails with an emery board or nail file.  Do not cut corns or calluses or try to remove them with medicine.  Wear clean socks or stockings every day. Make sure they are not too tight. Do not wear knee-high stockings since they may decrease blood flow to your legs.  Wear shoes that fit properly and have enough cushioning. To break in new shoes, wear them for just  a few hours a day. This prevents you from injuring your feet. Always look in your shoes before you put them on to be sure there are no objects inside.  Do not cross your legs. This may decrease the blood flow to your feet.  If you find a minor scrape, cut, or break in the skin on your feet, keep it and the skin around it clean and dry. These areas may be cleansed with mild soap and water. Do not cleanse the area with peroxide, alcohol, or iodine.  When you remove an adhesive bandage, be sure not to damage the skin around it.  If you have a wound, look at it several times a day to make sure it is healing.  Do not use heating pads or hot water bottles. They may burn your skin. If you have lost feeling  in your feet or legs, you may not know it is happening until it is too late.  Make sure your health care provider performs a complete foot exam at least annually or more often if you have foot problems. Report any cuts, sores, or bruises to your health care provider immediately. Contact a health care provider if:  You have an injury that is not healing.  You have cuts or breaks in the skin.  You have an ingrown nail.  You notice redness on your legs or feet.  You feel burning or tingling in your legs or feet.  You have pain or cramps in your legs and feet.  Your legs or feet are numb.  Your feet always feel cold. Get help right away if:  There is increasing redness, swelling, or pain in or around a wound.  There is a red line that goes up your leg.  Pus is coming from a wound.  You develop a fever or as directed by your health care provider.  You notice a bad smell coming from an ulcer or wound. This information is not intended to replace advice given to you by your health care provider. Make sure you discuss any questions you have with your health care provider. Document Released: 09/23/2000 Document Revised: 03/03/2016 Document Reviewed: 03/05/2013 Elsevier Interactive Patient Education  2017 [place] Prevention in the Home Falls can cause injuries. They can happen to people of all ages. There are many things you can do to make your home safe and to help prevent falls. What can I do on the outside of my home?  Regularly fix the edges of walkways and driveways and fix any cracks.  Remove anything that might make you trip as you walk through a door, such as a raised step or threshold.  Trim any bushes or trees on the path to your home.  Use bright outdoor lighting.  Clear any walking paths of anything that might make someone trip, such as rocks or tools.  Regularly check to see if handrails are loose or broken. Make sure that both sides of any steps  have handrails.  Any raised decks and porches should have guardrails on the edges.  Have any leaves, snow, or ice cleared regularly.  Use sand or salt on walking paths during winter.  Clean up any spills in your garage right away. This includes oil or grease spills. What can I do in the bathroom?  Use night lights.  Install grab bars by the toilet and in the tub and shower. Do not use towel bars as grab bars.  Use non-skid mats  or decals in the tub or shower.  If you need to sit down in the shower, use a plastic, non-slip stool.  Keep the floor dry. Clean up any water that spills on the floor as soon as it happens.  Remove soap buildup in the tub or shower regularly.  Attach bath mats securely with double-sided non-slip rug tape.  Do not have throw rugs and other things on the floor that can make you trip. What can I do in the bedroom?  Use night lights.  Make sure that you have a light by your bed that is easy to reach.  Do not use any sheets or blankets that are too big for your bed. They should not hang down onto the floor.  Have a firm chair that has side arms. You can use this for support while you get dressed.  Do not have throw rugs and other things on the floor that can make you trip. What can I do in the kitchen?  Clean up any spills right away.  Avoid walking on wet floors.  Keep items that you use a lot in easy-to-reach places.  If you need to reach something above you, use a strong step stool that has a grab bar.  Keep electrical cords out of the way.  Do not use floor polish or wax that makes floors slippery. If you must use wax, use non-skid floor wax.  Do not have throw rugs and other things on the floor that can make you trip. What can I do with my stairs?  Do not leave any items on the stairs.  Make sure that there are handrails on both sides of the stairs and use them. Fix handrails that are broken or loose. Make sure that handrails are as  long as the stairways.  Check any carpeting to make sure that it is firmly attached to the stairs. Fix any carpet that is loose or worn.  Avoid having throw rugs at the top or bottom of the stairs. If you do have throw rugs, attach them to the floor with carpet tape.  Make sure that you have a light switch at the top of the stairs and the bottom of the stairs. If you do not have them, ask someone to add them for you. What else can I do to help prevent falls?  Wear shoes that: ? Do not have high heels. ? Have rubber bottoms. ? Are comfortable and fit you well. ? Are closed at the toe. Do not wear sandals.  If you use a stepladder: ? Make sure that it is fully opened. Do not climb a closed stepladder. ? Make sure that both sides of the stepladder are locked into place. ? Ask someone to hold it for you, if possible.  Clearly mark and make sure that you can see: ? Any grab bars or handrails. ? First and last steps. ? Where the edge of each step is.  Use tools that help you move around (mobility aids) if they are needed. These include: ? Canes. ? Walkers. ? Scooters. ? Crutches.  Turn on the lights when you go into a dark area. Replace any light bulbs as soon as they burn out.  Set up your furniture so you have a clear path. Avoid moving your furniture around.  If any of your floors are uneven, fix them.  If there are any pets around you, be aware of where they are.  Review your medicines with your doctor. Some  medicines can make you feel dizzy. This can increase your chance of falling. Ask your doctor what other things that you can do to help prevent falls. This information is not intended to replace advice given to you by your health care provider. Make sure you discuss any questions you have with your health care provider. Document Released: 07/23/2009 Document Revised: 03/03/2016 Document Reviewed: 10/31/2014 Elsevier Interactive Patient Education  2018 [place] Maintenance, Male A healthy lifestyle and preventive care is important for your health and wellness. Ask your health care provider about what schedule of regular examinations is right for you. What should I know about weight and diet? Eat a Healthy Diet  Eat plenty of vegetables, fruits, whole grains, low-fat dairy products, and lean protein.  Do not eat a lot of foods high in solid fats, added sugars, or salt.  Maintain a Healthy Weight Regular exercise can help you achieve or maintain a healthy weight. You should:  Do at least 150 minutes of exercise each week. The exercise should increase your heart rate and make you sweat (moderate-intensity exercise).  Do strength-training exercises at least twice a week.  Watch Your Levels of Cholesterol and Blood Lipids  Have your blood tested for lipids and cholesterol every 5 years starting at 72 years of age. If you are at high risk for heart disease, you should start having your blood tested when you are 72 years old. You may need to have your cholesterol levels checked more often if: ? Your lipid or cholesterol levels are high. ? You are older than 72 years of age. ? You are at high risk for heart disease.  What should I know about cancer screening? Many types of cancers can be detected early and may often be prevented. Lung Cancer  You should be screened every year for lung cancer if: ? You are a current smoker who has smoked for at least 30 years. ? You are a former smoker who has quit within the past 15 years.  Talk to your health care provider about your screening options, when you should start screening, and how often you should be screened.  Colorectal Cancer  Routine colorectal cancer screening usually begins at 72 years of age and should be repeated every 5-10 years until you are 72 years old. You may need to be screened more often if early forms of precancerous polyps or small growths are found. Your health  care provider may recommend screening at an earlier age if you have risk factors for colon cancer.  Your health care provider may recommend using home test kits to check for hidden blood in the stool.  A small camera at the end of a tube can be used to examine your colon (sigmoidoscopy or colonoscopy). This checks for the earliest forms of colorectal cancer.  Prostate and Testicular Cancer  Depending on your age and overall health, your health care provider may do certain tests to screen for prostate and testicular cancer.  Talk to your health care provider about any symptoms or concerns you have about testicular or prostate cancer.  Skin Cancer  Check your skin from head to toe regularly.  Tell your health care provider about any new moles or changes in moles, especially if: ? There is a change in a mole's size, shape, or color. ? You have a mole that is larger than a pencil eraser.  Always use sunscreen. Apply sunscreen liberally and repeat throughout the day.  Protect yourself  by wearing long sleeves, pants, a wide-brimmed hat, and sunglasses when outside.  What should I know about heart disease, diabetes, and high blood pressure?  If you are 76-75 years of age, have your blood pressure checked every 3-5 years. If you are 75 years of age or older, have your blood pressure checked every year. You should have your blood pressure measured twice-once when you are at a hospital or clinic, and once when you are not at a hospital or clinic. Record the average of the two measurements. To check your blood pressure when you are not at a hospital or clinic, you can use: ? An automated blood pressure machine at a pharmacy. ? A home blood pressure monitor.  Talk to your health care provider about your target blood pressure.  If you are between 19-79 years old, ask your health care provider if you should take aspirin to prevent heart disease.  Have regular diabetes screenings by checking your  fasting blood sugar level. ? If you are at a normal weight and have a low risk for diabetes, have this test once every three years after the age of 49. ? If you are overweight and have a high risk for diabetes, consider being tested at a younger age or more often.  A one-time screening for abdominal aortic aneurysm (AAA) by ultrasound is recommended for men aged 78-75 years who are current or former smokers. What should I know about preventing infection? Hepatitis B If you have a higher risk for hepatitis B, you should be screened for this virus. Talk with your health care provider to find out if you are at risk for hepatitis B infection. Hepatitis C Blood testing is recommended for:  Everyone born from [birth year] through 1965.  Anyone with known risk factors for hepatitis C.  Sexually Transmitted Diseases (STDs)  You should be screened each year for STDs including gonorrhea and chlamydia if: ? You are sexually active and are younger than 72 years of age. ? You are older than 72 years of age and your health care provider tells you that you are at risk for this type of infection. ? Your sexual activity has changed since you were last screened and you are at an increased risk for chlamydia or gonorrhea. Ask your health care provider if you are at risk.  Talk with your health care provider about whether you are at high risk of being infected with HIV. Your health care provider may recommend a prescription medicine to help prevent HIV infection.  What else can I do?  Schedule regular health, dental, and eye exams.  Stay current with your vaccines (immunizations).  Do not use any tobacco products, such as cigarettes, chewing tobacco, and e-cigarettes. If you need help quitting, ask your health care provider.  Limit alcohol intake to no more than 2 drinks per day. One drink equals 12 ounces of beer, 5 ounces of wine, or 1 ounces of hard liquor.  Do not use street drugs.  Do not share  needles.  Ask your health care provider for help if you need support or information about quitting drugs.  Tell your health care provider if you often feel depressed.  Tell your health care provider if you have ever been abused or do not feel safe at home. This information is not intended to replace advice given to you by your health care provider. Make sure you discuss any questions you have with your health care provider. Document Released: 03/24/2008 Document Revised: 05/25/2016  Document Reviewed: 06/30/2015 Elsevier Interactive Patient Education  2018 [employer].   Hearing Loss Hearing loss is a partial or total loss of the ability to hear. This can be temporary or permanent, and it can happen in one or both ears. Hearing loss may be referred to as deafness. Medical care is necessary to treat hearing loss properly and to prevent the condition from getting worse. Your hearing may partially or completely come back, depending on what caused your hearing loss and how severe it is. In some cases, hearing loss is permanent. What are the causes? Common causes of hearing loss include:  Too much wax in the ear canal.  Infection of the ear canal or middle ear.  Fluid in the middle ear.  Injury to the ear or surrounding area.  An object stuck in the ear.  Prolonged exposure to loud sounds, such as music.  Less common causes of hearing loss include:  Tumors in the ear.  Viral or bacterial infections, such as meningitis.  A hole in the eardrum (perforated eardrum).  Problems with the hearing nerve that sends signals between the brain and the ear.  Certain medicines.  What are the signs or symptoms? Symptoms of this condition may include:  Difficulty telling the difference between sounds.  Difficulty following a conversation when there is background noise.  Lack of response to sounds in your environment. This may be most noticeable when you do not respond to startling  sounds.  Needing to turn up the volume on the television, radio, etc.  Ringing in the ears.  Dizziness.  Pain in the ears.  How is this diagnosed? This condition is diagnosed based on a physical exam and a hearing test (audiometry). The audiometry test will be performed by a hearing specialist (audiologist). You may also be referred to an ear, nose, and throat (ENT) specialist (otolaryngologist). How is this treated? Treatment for recent onset of hearing loss may include:  Ear wax removal.  Being prescribed medicines to prevent infection (antibiotics).  Being prescribed medicines to reduce inflammation (corticosteroids).  Follow these instructions at home:  If you were prescribed an antibiotic medicine, take it as told by your health care provider. Do not stop taking the antibiotic even if you start to feel better.  Take over-the-counter and prescription medicines only as told by your health care provider.  Avoid loud noises.  Return to your normal activities as told by your health care provider. Ask your health care provider what activities are safe for you.  Keep all follow-up visits as told by your health care provider. This is important. Contact a health care provider if:  You feel dizzy.  You develop new symptoms.  You vomit or feel nauseous.  You have a fever. Get help right away if:  You develop sudden changes in your vision.  You have severe ear pain.  You have new or increased weakness.  You have a severe headache. This information is not intended to replace advice given to you by your health care provider. Make sure you discuss any questions you have with your health care provider. Document Released: 09/26/2005 Document Revised: 03/03/2016 Document Reviewed: 02/11/2015 Elsevier Interactive Patient Education  2018 [employer].

## 2018-01-25 NOTE — Assessment & Plan Note (Signed)
S: doing well On thyroid medication-synthroid 50 mcg Lab Results  Component Value Date   TSH 0.86 06/13/2017  A/P: continue current medicine

## 2018-01-25 NOTE — Progress Notes (Signed)
Subjective:  [name] is a 72 y.o. year old very pleasant male patient who presents for/with See problem oriented charting ROS-has had weight loss but has been intentional.  No increased edema.  No chest pain.  No blurred vision  Past Medical History-  Patient Active Problem List   Diagnosis Date Noted  . History of transient ischemic attack (TIA) 10/24/2015    Priority: High  . CHF (congestive heart failure), NYHA class II ([place]) 05/09/2014    Priority: High  . IBD (inflammatory bowel disease) 08/09/2011    Priority: High  . Atrial fibrillation ([place]) 12/08/2010    Priority: High  . Type II diabetes mellitus with peripheral circulatory disorder ([place]) 04/16/2007    Priority: High  . CAD (coronary artery disease), native coronary artery 04/12/2007    Priority: High  . Morbid obesity ([place]) 08/09/2011    Priority: Medium  . Hyperlipidemia 04/16/2007    Priority: Medium  . Hypothyroidism 04/12/2007    Priority: Medium  . Essential hypertension 04/12/2007    Priority: Medium  . OSA (obstructive sleep apnea) 04/12/2007    Priority: Medium  . Former smoker 08/11/2016    Priority: Low  . MCI (mild cognitive impairment) 01/19/2016    Priority: Low  . Osteoarthritis of left knee 02/09/2015    Priority: Low  . Allergic rhinitis 06/25/2012    Priority: Low  . GERD (gastroesophageal reflux disease) 08/09/2011    Priority: Low  . VITAMIN B12 DEFICIENCY 11/29/2010    Priority: Low    Medications- reviewed and updated Current Outpatient Medications  Medication Sig Dispense Refill  . atorvastatin (LIPITOR) 20 MG tablet Take 1 tablet (20 mg total) by mouth daily. Please call and schedule an appointment for further refills 1st attempt 90 tablet 0  . carvedilol (COREG) 25 MG tablet TAKE ONE TABLET TWICE DAILY WITH A MEAL 60 tablet 10  . clopidogrel (PLAVIX) 75 MG tablet TAKE ONE (1) TABLET EACH DAY 90 tablet 1  . diclofenac sodium (VOLTAREN) 1 % GEL Apply 2 g topically 4 (four)  times daily as needed (knee pain).    . fish oil-omega-3 fatty acids 1000 MG capsule Take 2 g by mouth daily.     . furosemide (LASIX) 40 MG tablet TAKE ONE (1) TABLET BY MOUTH EVERY DAY 30 tablet 7  . glipiZIDE (GLUCOTROL) 10 MG tablet TAKE ONE TABLET TWICE DAILY 180 tablet 2  . glucosamine-chondroitin 500-400 MG tablet Take 1 tablet by mouth daily as needed (knee flare up or pain).    Marland Kitchen glucose blood test strip Use as instructed to test 1 time daily 100 each 12  . isosorbide mononitrate (IMDUR) 60 MG 24 hr tablet TAKE ONE (1) TABLET BY MOUTH EVERY DAY 90 tablet 3  . JARDIANCE 10 MG TABS tablet TAKE ONE TABLET BY MOUTH EVERY OTHER DAY 30 tablet 3  . mesalamine (APRISO) 0.375 g 24 hr capsule Take 4 capsules (1.5 g total) by mouth daily. 120 capsule 11  . metFORMIN (GLUCOPHAGE) 1000 MG tablet TAKE ONE TABLET TWICE DAILY WITH A MEAL 180 tablet 1  . olmesartan (BENICAR) 40 MG tablet Take 1 tablet (40 mg total) by mouth daily. 90 tablet 3  . potassium chloride SA (K-DUR,KLOR-CON) 20 MEQ tablet TAKE ONE (1) TABLET BY MOUTH EVERY DAY 90 tablet 1  . PRADAXA 150 MG CAPS capsule TAKE ONE CAPSULE BY MOUTH TWICE A DAY 60 capsule 3  . sitaGLIPtin (JANUVIA) 100 MG tablet TAKE ONE (1) TABLET EACH DAY 90 tablet  2  . spironolactone (ALDACTONE) 25 MG tablet TAKE ONE-HALF TABLET BY MOUTH ONCE A DAY 45 tablet 3  . SYNTHROID 50 MCG tablet Take 1 tablet (50 mcg total) by mouth daily before breakfast. 90 tablet 3  . telmisartan (MICARDIS) 40 MG tablet Take 0.5 tablets (20 mg total) by mouth daily. 90 tablet 1  . ketoconazole (NIZORAL) 2 % cream Apply 1 application topically daily. 30 g 0   No current facility-administered medications for this visit.     Objective: BP 128/70 (BP Location: Left Arm, Patient Position: Sitting, Cuff Size: Large)   Ht 5' 7"  (1.702 m)   Wt 268 lb 6.4 oz (121.7 kg)   BMI 42.04 kg/m  Gen: NAD, resting comfortably CV: RRR no murmurs rubs or gallops Lungs: CTAB no crackles, wheeze,  rhonchi Abdomen: soft/nontender/nondistended/normal bowel sounds.obese Ext: no edema Skin: warm, dry, left foot with erythema through toes with white and scaling- some erythema along medial left foot as well without significant scaling Neuro: normal speech  Assessment/Plan:  Left foot rash S:2-3 weeks of rash. Tried some OTC for athletes foot and helped some.  A/P: ketoconazole-Advised good 2 week trial of cream. Return precautions if fails to improve  CHF (congestive heart failure), NYHA class II ([place]) Hypertension S: BP controlled on coreg 48m BID, lasix 482m imdur 6050mbenicar 3m29mpically (but right now on telmisartan due to backorder), spironolactone 12.5mg.69me lasix and spironolactone help with his CHF. Weight down 15 lbs since September- he states he has eaten somewhat better A/P: We discussed blood pressure goal of <140/90. Discussed monitoring his fluid status with edema, shortness of breath. Will monitor weight- if continues to go down may investigate further since patient states has not aggressively tried to lose weight though has made some positive changes- wouldn't be surprised if this buoyed back upwards.  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 Encouraged follow up with Dr. Stark Fuller Planid obesity (HCC) BBlanchardremains above 40 but has had significant weight loss of 15 pounds.  He is try to make some mild adjustments such as frozen strawberries instead of ice cream nightly.  Encouraged him to continue to pursue weight loss  and improvements in his diet.  Hopefully at least 5 extra pounds down by next visit  Future Appointments  Date Time [place]  8:15 AM WeaverRichardson Dopp-C [facility]  02/13/2018 10:15 AM GherghPhilemon KingdomBPC[facility] None  04/09/2018  8:20 AM CooperSherren MochaVD[facility]  04/18/2018  2:00 PM Rigby,Gerda Diss[facility]  08/14/2018  2:30 PM HunterMarin OlpBPC[facility]  01/31/2019  2:00 PM DrummoWilliemae AreaBPC-[facility] PEC   Return in about 6 months (around 07/27/2018) for follow up- or sooner if needed.   Meds ordered this encounter  Medications  . ketoconazole (NIZORAL) 2 % cream    Sig: Apply 1 application topically daily.    Dispense:  30 g    Refill:  0    Return precautions advised.  Particularly if rash fails to  improve Garret Reddish, MD

## 2018-01-25 NOTE — Patient Instructions (Addendum)
Call Dr. Fuller Plan for follow up since it has been over a year  Try cream on foot for 2 weeks at least. Need to see [place] or podiatry back if not improving  Otherwise no changes in medicines  See me in 6 months- come fasting and we will update labs

## 2018-01-25 NOTE — Assessment & Plan Note (Signed)
S:  controlled on last LDL with # <70 in September on atorvastastatin 55m. Had been in reveal studay  A/P: update lipids next visit

## 2018-01-25 NOTE — Assessment & Plan Note (Signed)
S: rate controlled on coreg and on pradaxa for anticoagulation A/P: continue current medicine

## 2018-01-25 NOTE — Assessment & Plan Note (Signed)
Hypertension S: BP controlled on coreg 1m BID, lasix 420m imdur 6048mbenicar 4m92mpically (but right now on telmisartan due to backorder), spironolactone 12.5mg.64me lasix and spironolactone help with his CHF. Weight down 15 lbs since September- he states he has eaten somewhat better A/P: We discussed blood pressure goal of <140/90. Discussed monitoring his fluid status with edema, shortness of breath. Will monitor weight- if continues to go down may investigate further since patient states has not aggressively tried to lose weight- wouldn't be surprised if this buoyed back upwards.

## 2018-01-25 NOTE — Assessment & Plan Note (Signed)
See chf section

## 2018-01-26 DIAGNOSIS — K5100 Ulcerative (chronic) pancolitis without complications: Secondary | ICD-10-CM | POA: Insufficient documentation

## 2018-01-26 NOTE — Assessment & Plan Note (Signed)
BMI remains above 40 but has had significant weight loss of 15 pounds.  He is try to make some mild adjustments such as frozen strawberries instead of ice cream nightly.  Encouraged him to continue to pursue weight loss and improvements in his diet.  Hopefully at least 5 extra pounds down by next visit

## 2018-01-26 NOTE — Progress Notes (Signed)
I have reviewed and agree with note, evaluation, plan.  Please see my separate note from today  Garret Reddish, MD

## 2018-01-31 ENCOUNTER — Telehealth: Payer: Self-pay | Admitting: Family Medicine

## 2018-01-31 NOTE — Telephone Encounter (Signed)
Copied from [place]. Topic: Referral - Request >> Jan 31, 2018  2:16 PM Margot Ables wrote: Reason for CRM: pt called stating that his foot problem is no better and wanting a referral to podiatry. He still has a rash on his foot though it doesn't bother him that much. The cream isn't doing much good. Please advise.  Ok to leave a detailed msg on cell or home phone #s if no answer.

## 2018-02-01 ENCOUNTER — Other Ambulatory Visit: Payer: Self-pay

## 2018-02-01 DIAGNOSIS — R21 Rash and other nonspecific skin eruption: Secondary | ICD-10-CM

## 2018-02-01 NOTE — Telephone Encounter (Signed)
Referral to podiatry has been placed.

## 2018-02-02 ENCOUNTER — Ambulatory Visit (INDEPENDENT_AMBULATORY_CARE_PROVIDER_SITE_OTHER): Payer: Medicare Other | Admitting: Podiatry

## 2018-02-02 ENCOUNTER — Encounter: Payer: Self-pay | Admitting: Podiatry

## 2018-02-02 ENCOUNTER — Telehealth: Payer: Self-pay

## 2018-02-02 VITALS — BP 175/99 | HR 75

## 2018-02-02 DIAGNOSIS — L309 Dermatitis, unspecified: Secondary | ICD-10-CM

## 2018-02-02 DIAGNOSIS — B351 Tinea unguium: Secondary | ICD-10-CM

## 2018-02-02 DIAGNOSIS — M79675 Pain in left toe(s): Secondary | ICD-10-CM

## 2018-02-02 DIAGNOSIS — M79674 Pain in right toe(s): Secondary | ICD-10-CM | POA: Diagnosis not present

## 2018-02-02 MED ORDER — TERBINAFINE HCL 250 MG PO TABS: 250.0000 mg | ORAL_TABLET | Freq: Every day | ORAL | 0 refills | Status: DC

## 2018-02-02 NOTE — Telephone Encounter (Signed)
Called patient to inform him about his referral to podiatry. Patient stated he saw Dr. Paulla Dolly today at [facility] and he started him on a medication that's suppose to clear up his rash.

## 2018-02-02 NOTE — Telephone Encounter (Signed)
Called patient to inform him about his referral to podiatry. Patient stated he saw Dr. Paulla Dolly today at [facility] and he started him on a medication that's suppose to clear up his rash.

## 2018-02-04 NOTE — Progress Notes (Signed)
Subjective:   Patient ID: [name], male   DOB: 72 y.o.   MRN: [id number]   HPI Patient presents with changes in texture of both feet with possibility for fungal infection and also has thick painful nailbeds 1-5 both feet that he cannot cut and his wife also cannot cut currently.  Patient is a diabetic   Review of Systems  All other systems reviewed and are negative.       Objective:  Physical Exam  Constitutional: He appears well-developed and well-nourished.  Cardiovascular: Intact distal pulses.  Pulmonary/Chest: Effort normal.  Musculoskeletal: Normal range of motion.  Neurological: He is alert.  Skin: Skin is warm.  Nursing note and vitals reviewed.   Vascular status intact with neurological disease noted bilaterally diminishment of sharp dull vibratory.  Patient does have weak pulses but present and does have thick yellow brittle nailbeds 1-5 both feet that do get painful and are incurvated in the corners and does have moccasin appearance to the skin bilateral.  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  Patient will be seen back if symptoms persist I also advised on seeing dermatologist if skin manifestations should remain

## 2018-02-06 NOTE — Progress Notes (Signed)
Cardiology Office Note:    Date:  02/07/2018   ID:  [name], DOB Aug 28, 1946, MRN [id number]  PCP:  Andrew Olp, MD  Cardiologist:  Andrew Mocha, MD   Referring MD: Andrew Olp, MD   Chief Complaint  Patient presents with  . Follow-up    75-month CHF and CAD    History of Present Illness:    [name] a 72y.o. male with a hx of permanent Afib, CAD s/p PCI to LAD, last heart cath in 2017 with patent stent, severe ostial stenosis of the left Cx not amenable to PCI and patient nondominant right coronary artery supplying collaterals to the diagonal branch of hte LAD, treated with medical thearpy. Pt also had a history of chronic combined systolic and diastolic heart failure, DM, OSA on CPAP, morbid obesity, DM2, HTN, and HLD.  Pt is on long-term plavix for stent thrombosis. He was last seen in clinic by [name] 04/20/17. He was maintained plavix, lasix, imdur, coreg, telmisartan, and spironolactone. He is anticoagulated with pradaxa for Afib. He is on lipitor for HLD.   He presents today for his follow up. He has been doing well. He denies chest pain, SOB, orthopnea, and has been using his CPAP. He recently had a steroid injection in his left knee, which resulted in an increase in his BG. His BG has now stabilized. He also complains of some loose stools that may be the result of daily oatmeal. I asked him to cut down to oatmeal three times weekly to see if this improved his stool.   He requests to keep his appt with [name] 2 months instead of waiting six months.  Past Medical History:  Diagnosis Date  . Atrial fibrillation ([place]    initial diagnoses 2012  . B12 deficiency    per patient previously taking shots  . CORONARY ARTERY DISEASE 04/12/2007   2 stents last in 2006.   .Marland KitchenDIABETES MELLITUS, TYPE II 04/16/2007  . Diverticulosis of colon (without mention of hemorrhage)   . HYPERLIPIDEMIA 04/16/2007   reveal study. not sure of medication  .  HYPERTENSION 04/12/2007  . HYPOTHYROIDISM 04/12/2007  . Ischemic cardiomyopathy    cath 35-40%  . MYOCARDIAL INFARCTION, HX OF 04/12/2007  . OBESITY 06/16/2009  . SLEEP APNEA 04/12/2007   CPAP  . Stroke ([place]   . ULCERATIVE COLITIS, LEFT SIDED 11/23/2010    Past Surgical History:  Procedure Laterality Date  . CARDIAC CATHETERIZATION  10/2002   STENT. 2 stents Dr. BMaurene [name] . CARDIAC CATHETERIZATION N/A 10/28/2015   Procedure: Right/Left Heart Cath and Coronary Angiography;  Surgeon: Andrew Mocha MD;  [facility];  Service: Cardiovascular;  Laterality: N/A;  . CORONARY STENT PLACEMENT  2008   LAD   . TONSILLECTOMY      Current Medications: Current Meds  Medication Sig  . atorvastatin (LIPITOR) 20 MG tablet Take 1 tablet (20 mg total) by mouth daily. Please call and schedule an appointment for further refills 1st attempt  . carvedilol (COREG) 25 MG tablet TAKE ONE TABLET TWICE DAILY WITH A MEAL  . clopidogrel (PLAVIX) 75 MG tablet TAKE ONE (1) TABLET EACH DAY  . diclofenac sodium (VOLTAREN) 1 % GEL Apply 2 g topically 4 (four) times daily as needed (knee pain).  . fish oil-omega-3 fatty acids 1000 MG capsule Take 2 g by mouth daily.   . furosemide (LASIX) 40 MG tablet TAKE ONE (1) TABLET BY MOUTH  EVERY DAY  . glipiZIDE (GLUCOTROL) 10 MG tablet TAKE ONE TABLET TWICE DAILY  . glucosamine-chondroitin 500-400 MG tablet Take 1 tablet by mouth daily as needed (knee flare up or pain).  Marland Kitchen glucose blood test strip Use as instructed to test 1 time daily  . isosorbide mononitrate (IMDUR) 60 MG 24 hr tablet TAKE ONE (1) TABLET BY MOUTH EVERY DAY  . JARDIANCE 10 MG TABS tablet TAKE ONE TABLET BY MOUTH EVERY OTHER DAY  . ketoconazole (NIZORAL) 2 % cream Apply 1 application topically daily.  . mesalamine (APRISO) 0.375 g 24 hr capsule Take 4 capsules (1.5 g total) by mouth daily.  . metFORMIN (GLUCOPHAGE) 1000 MG tablet TAKE ONE TABLET TWICE DAILY WITH A MEAL  . potassium chloride SA  (K-DUR,KLOR-CON) 20 MEQ tablet TAKE ONE (1) TABLET BY MOUTH EVERY DAY  . PRADAXA 150 MG CAPS capsule TAKE ONE CAPSULE BY MOUTH TWICE A DAY  . sitaGLIPtin (JANUVIA) 100 MG tablet TAKE ONE (1) TABLET EACH DAY  . spironolactone (ALDACTONE) 25 MG tablet TAKE ONE-HALF TABLET BY MOUTH ONCE A DAY  . SYNTHROID 50 MCG tablet Take 1 tablet (50 mcg total) by mouth daily before breakfast.  . telmisartan (MICARDIS) 40 MG tablet Take 0.5 tablets (20 mg total) by mouth daily.  Marland Kitchen terbinafine (LAMISIL) 250 MG tablet Take 1 tablet (250 mg total) by mouth daily.  . [DISCONTINUED] olmesartan (BENICAR) 40 MG tablet Take 1 tablet (40 mg total) by mouth daily.     Allergies:   Patient has no known allergies.   Social History   Socioeconomic History  . Marital status: Married    Spouse name: Not on file  . Number of children: 1  . Years of education: Not on file  . Highest education level: Not on file  Occupational History  . Occupation: RETIRED    Employer: RETIRED  Social Needs  . Financial resource strain: Not on file  . Food insecurity:    Worry: Not on file    Inability: Not on file  . Transportation needs:    Medical: Not on file    Non-medical: Not on file  Tobacco Use  . Smoking status: Former Smoker    Packs/day: 1.00    Years: 5.00    Pack years: 5.00    Types: Cigarettes    Last attempt to quit: 04/13/1966    Years since quitting: 51.8  . Smokeless tobacco: Never Used  Substance and Sexual Activity  . Alcohol use: No    Alcohol/week: 0.0 oz    Comment: no more beer  . Drug use: No  . Sexual activity: Not on file  Lifestyle  . Physical activity:    Days per week: Not on file    Minutes per session: Not on file  . Stress: Not on file  Relationships  . Social connections:    Talks on phone: Not on file    Gets together: Not on file    Attends religious service: Not on file    Active member of club or organization: Not on file    Attends meetings of clubs or organizations: Not  on file    Relationship status: Not on file  Other Topics Concern  . Not on file  Social History Narrative   Cardiorehab 3 days a week. 45 minutes to an hour-stationary bike.    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       Married for 37 years in 2015, married previously for 14 years. Daughter with first wife and 3 grandkids.    Lives alone with wife. Get to see grandkids a lot. New grandchild in middle of 72      Hobbies-yardwork, previously liked to hunt and fish, does some target shooting     Family History: The patient's family history includes Heart attack in his mother; Heart disease in his father; Hypertension in his brother; Obesity in his brother; Stomach cancer in his maternal aunt and paternal grandmother.  ROS:   Please see the history of present illness.     All other systems reviewed and are negative.  EKGs/Labs/Other Studies Reviewed:    The following studies were reviewed today:  Echo 09/29/16: Study Conclusions - Procedure narrative: Transthoracic echocardiography for   diagnosis, for left ventricular function evaluation, for right   ventricular function evaluation, and for assessment of valvular   function. Image quality was adequate. The study was technically   difficult, as a result of poor sound wave transmission.   Intravenous contrast (Definity) was administered to opacify the   LV. - Left ventricle: The cavity size was normal. There was severe   concentric hypertrophy. Systolic function was mildly to   moderately reduced. The estimated ejection fraction was in the   range of 40% to 45%. Anteroseptal and anteroapical hypokinesis.   The study is not technically sufficient to allow evaluation of LV   diastolic function. - Mitral valve: Mildly thickened leaflets . There was trivial   regurgitation. - Left atrium: Moderately  dilated. - Inferior vena cava: The vessel was normal in size. The   respirophasic diameter changes were in the normal range (>= 50%),   consistent with normal central venous pressure.  Impressions: - Technically difficult study. Definity contrast given. LVEF   40-45%, anteroseptal and anteroapical hypokinesis, moderate LAE.   Compared to a prior study in 10/2015, the LVEF is slightly   improved.  EKG:  EKG is ordered today.  The ekg ordered today demonstrates atrial fibrillation with left axis deviation, unchanged from prior.    Recent Labs: 06/13/2017: ALT 12; BUN 12; Creatinine, Ser 1.07; Hemoglobin 14.3; Platelets 169.0; Potassium 4.4; Sodium 142; TSH 0.86  Recent Lipid Panel    Component Value Date/Time   CHOL 88 (L) 10/19/2016 0942   TRIG 232 (H) 10/19/2016 0942   HDL 42 10/19/2016 0942   CHOLHDL 2.1 10/19/2016 0942   CHOLHDL 1.7 10/25/2015 0518   VLDL 16 10/25/2015 0518   LDLCALC Comment (A) 10/19/2016 0942   LDLDIRECT 18.0 06/13/2017 1504    Physical Exam:    VS:  BP 138/86   Pulse 82   Ht 5' 7"  (1.702 m)   Wt 274 lb 12.8 oz (124.6 kg)   SpO2 94%   BMI 43.04 kg/m     Wt Readings from Last 3 Encounters:  02/07/18 274 lb 12.8 oz (124.6 kg)  01/25/18 268 lb 6.4 oz (121.7 kg)  01/25/18 268 lb (121.6 kg)     GEN: obese male in no acute distress HEENT: Normal NECK: No JVD; No carotid bruits LYMPHATICS: No lymphadenopathy CARDIAC: irregular rhythm, regular rate, no murmur RESPIRATORY:  Clear to auscultation without rales, wheezing or rhonchi ABDOMEN: Soft, non-tender, non-distended MUSCULOSKELETAL:  Trace LE edema; No deformity  SKIN: Warm and dry NEUROLOGIC:  Alert and oriented x 3 PSYCHIATRIC:  Normal affect   ASSESSMENT:  1. Permanent atrial fibrillation ([place])   2. Chronic combined systolic and diastolic heart failure ([place])   3. Essential hypertension   4. Type II diabetes mellitus with peripheral circulatory disorder (HCC)   5. OSA (obstructive sleep  apnea)   6. Coronary artery disease of native artery of native heart with stable angina pectoris ([place])    PLAN:    In order of problems listed above:  Permanent atrial fibrillation ([place]) - Plan: EKG 12-Lead EKG with atrial fibrillation and left axis deviation, unchanged from prior. He is doing well on pradaxa.  This patients CHA2DS2-VASc Score and unadjusted Ischemic Stroke Rate (% per year) is equal to 4.8 % stroke rate/year from a score of 4 (CHF, HTN, DM, CAD, age). No medication changes.   Chronic combined systolic and diastolic heart failure (HCC) Pt is doing well on current lasix regimen. Weight today is 274 lbs and he appears euvolemic on exam. No medication changes. Will check a BMP today. Continue lasix and potassium.    Essential hypertension Pressure mildly elevated today, but he has not taken any of his medications this morning. No medication changes; encouraged to follow pressures at home. 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 He is now taking 20 mg telmisartan. Continue telmisartan, imdur, coreg, and spironolactone.   Type II diabetes mellitus with peripheral circulatory disorder (HCC) Followed by PCP. On metformin, glipizide, and jardiance. Increased BG with recent knee injection. Encouraged him to contact his PCP prior to next knee injection to adjust medications to allow for steroid-induced hyperglycemia.   OSA (obstructive sleep apnea) Compliant on CPAP.   Coronary artery disease of native artery of native heart with stable angina pectoris (HCC) No chest pain. Continue long term plavix.     Medication Adjustments/Labs and Tests Ordered: Current medicines are reviewed at length with the patient today.  Concerns regarding medicines are outlined above.  Orders Placed This Encounter  Procedures  . Basic Metabolic Panel (BMET)  . Lipid Profile  . Hepatic function panel  . EKG 12-Lead   No orders of the defined types were placed in this  encounter.   Signed, Ledora Bottcher, PA  02/07/2018 8:59 AM    [place]

## 2018-02-07 ENCOUNTER — Encounter: Payer: Self-pay | Admitting: Physician Assistant

## 2018-02-07 ENCOUNTER — Telehealth: Payer: Self-pay | Admitting: *Deleted

## 2018-02-07 ENCOUNTER — Ambulatory Visit (INDEPENDENT_AMBULATORY_CARE_PROVIDER_SITE_OTHER): Payer: Medicare Other | Admitting: Physician Assistant

## 2018-02-07 VITALS — BP 138/86 | HR 82 | Ht 67.0 in | Wt 274.8 lb

## 2018-02-07 DIAGNOSIS — I25118 Atherosclerotic heart disease of native coronary artery with other forms of angina pectoris: Secondary | ICD-10-CM

## 2018-02-07 DIAGNOSIS — I10 Essential (primary) hypertension: Secondary | ICD-10-CM | POA: Diagnosis not present

## 2018-02-07 DIAGNOSIS — E1151 Type 2 diabetes mellitus with diabetic peripheral angiopathy without gangrene: Secondary | ICD-10-CM

## 2018-02-07 DIAGNOSIS — I5042 Chronic combined systolic (congestive) and diastolic (congestive) heart failure: Secondary | ICD-10-CM | POA: Diagnosis not present

## 2018-02-07 DIAGNOSIS — G4733 Obstructive sleep apnea (adult) (pediatric): Secondary | ICD-10-CM | POA: Diagnosis not present

## 2018-02-07 DIAGNOSIS — I482 Chronic atrial fibrillation: Secondary | ICD-10-CM

## 2018-02-07 DIAGNOSIS — I5043 Acute on chronic combined systolic (congestive) and diastolic (congestive) heart failure: Secondary | ICD-10-CM | POA: Insufficient documentation

## 2018-02-07 DIAGNOSIS — I4821 Permanent atrial fibrillation: Secondary | ICD-10-CM

## 2018-02-07 LAB — BASIC METABOLIC PANEL
BUN/Creatinine Ratio: 10 (ref 10–24)
BUN: 12 mg/dL (ref 8–27)
CALCIUM: 9.1 mg/dL (ref 8.6–10.2)
CO2: 28 mmol/L (ref 20–29)
CREATININE: 1.15 mg/dL (ref 0.76–1.27)
Chloride: 98 mmol/L (ref 96–106)
GFR calc Af Amer: 73 mL/min/{1.73_m2} (ref >59)
GFR calc non Af Amer: 63 mL/min/{1.73_m2} (ref >59)
GLUCOSE: 150 mg/dL — AB (ref 65–99)
POTASSIUM: 4.4 mmol/L (ref 3.5–5.2)
SODIUM: 141 mmol/L (ref 134–144)

## 2018-02-07 LAB — LIPID PANEL
CHOLESTEROL TOTAL: 74 mg/dL — AB (ref 100–199)
Chol/HDL Ratio: 1.6 ratio (ref 0.0–5.0)
HDL: 45 mg/dL (ref >39)
LDL CALC: 13 mg/dL (ref 0–99)
TRIGLYCERIDES: 82 mg/dL (ref 0–149)
VLDL Cholesterol Cal: 16 mg/dL (ref 5–40)

## 2018-02-07 LAB — HEPATIC FUNCTION PANEL
ALT: 8 IU/L (ref 0–44)
AST: 10 IU/L (ref 0–40)
Albumin: 4.3 g/dL (ref 3.5–4.8)
Alkaline Phosphatase: 91 IU/L (ref 39–117)
BILIRUBIN TOTAL: 0.6 mg/dL (ref 0.0–1.2)
BILIRUBIN, DIRECT: 0.24 mg/dL (ref 0.00–0.40)
Total Protein: 6.4 g/dL (ref 6.0–8.5)

## 2018-02-07 NOTE — Patient Instructions (Signed)
Medication Instructions:  1. Your physician recommends that you continue on your current medications as directed. Please refer to the Current Medication list given to you today.   Labwork: TODAY FASTING LIPID, LIVER AND BMET  Testing/Procedures: NONE ORDERED TODAY  Follow-Up: 1. [name] 04/09/18  Any Other Special Instructions Will Be Listed Below (If Applicable).     If you need a refill on your cardiac medications before your next appointment, please call your pharmacy.

## 2018-02-07 NOTE — Telephone Encounter (Signed)
-----   Message from Ledora Bottcher, [place] sent at 02/07/2018  4:31 PM EDT ----- Your labs are normal. Continue all medications as discussed.

## 2018-02-07 NOTE — Telephone Encounter (Signed)
Left message to go over lab results. DPR ok to lmom. I did lmom lab work ok, continue on current Tx plan as discussed at appt today. If any questions please call [phone].

## 2018-02-08 ENCOUNTER — Other Ambulatory Visit: Payer: Self-pay

## 2018-02-08 DIAGNOSIS — E1151 Type 2 diabetes mellitus with diabetic peripheral angiopathy without gangrene: Secondary | ICD-10-CM

## 2018-02-08 MED ORDER — SITAGLIPTIN PHOSPHATE 100 MG PO TABS: ORAL_TABLET | ORAL | 2 refills | Status: DC

## 2018-02-13 ENCOUNTER — Ambulatory Visit (INDEPENDENT_AMBULATORY_CARE_PROVIDER_SITE_OTHER): Payer: Medicare Other | Admitting: Internal Medicine

## 2018-02-13 ENCOUNTER — Encounter: Payer: Self-pay | Admitting: Internal Medicine

## 2018-02-13 VITALS — BP 162/94 | HR 94 | Ht 67.0 in | Wt 282.0 lb

## 2018-02-13 DIAGNOSIS — E785 Hyperlipidemia, unspecified: Secondary | ICD-10-CM | POA: Diagnosis not present

## 2018-02-13 DIAGNOSIS — E1151 Type 2 diabetes mellitus with diabetic peripheral angiopathy without gangrene: Secondary | ICD-10-CM | POA: Diagnosis not present

## 2018-02-13 DIAGNOSIS — I25118 Atherosclerotic heart disease of native coronary artery with other forms of angina pectoris: Secondary | ICD-10-CM

## 2018-02-13 NOTE — Patient Instructions (Signed)
Please continue: - Glipizide 10 mg 2x a day - Metformin 1000 mg 2x a day - Januvia 100 mg daily    - Jardiance 10 mg every other day in a.m.   Please stop at the lab.  Please return in 4 months with your sugar log.

## 2018-02-13 NOTE — Progress Notes (Signed)
Patient ID: [name], male   DOB: October 31, 1945, 72 y.o.   MRN: [id number]  HPI: [name] is a 72 y.o.-year-old male, returning for DM2, dx 2004, non-insulin-dependent, uncontrolled, with complications (CAD, s/p MI). Last visit 4 months ago. He has [facility] as supplemental and [employer] for meds in 10/2015.  He had a steroid inj in knee by [name] at beginning of 01/2018.  Sugars high for ~1 week.  He started to see nutrition >> changed diet  - now a lot of fruit, cut back on eggs >> in the last 2 weeks sugars greatly improved.  Last hemoglobin A1c was: 10/16/2017: HbA1c calculated from fructosamine is: 7.3% (higher). 06/16/2017: HbA1c calculated from the fructosamine is stable, at 6.8%.  12/08/2016: HbA1c calculated from the fructosamine is better, at 6.86%. 08/10/2016: HbA1c calculated from  fructosamine is 7.88% 05/10/2016: HbA1c calculated from fructosamine is 7.6% 01/06/2016: HbA1c calculated from fructosamine is 6.6% 09/06/2016: HbA1c calculated from fructosamine is 7.0% 06/01/2015: HbA1c calculated from fructosamine is 7.0% 02/23/2015: HbA1c calculated from fructosamine is 6.88% 10/15/2014: HbA1c calculated from fructosamine is 7.17% Lab Results  Component Value Date   HGBA1C 8.8 06/16/2017   HGBA1C 9.3 03/10/2017   HGBA1C 7.6 (H) 10/25/2015  He started to change his eating habits since 03/2013.   Pt is on a regimen of: - Glipizide 10 mg 2x a day - Metformin 1000 mg 2x a day - Januvia 100 mg daily    - Jardiance 10 mg daily in am >> increased urination >>  he takes it every other day He does not miss doses.  Pt checks his sugars 4 times a day - in last 3 weeks: - am: 106-140 >> 109-137 >> 112-156, 206 - 2h after breakfast: 132-162 >> 141-174 >> 104-126, 185 - before lunch 144 >> 110-120s >> n/c  - before dinner: 108-129 >> 102-130 >> 84, 119-175, 287 - 2h after dinner: 139-157 >> 149-179 >> 89-144, 166 No lows; he has hypoglycemia awareness in the 60s. Highest:  287  His wife is a Radiographer, therapeutic >> eating sweets occasionally. He goes to [employer] place - 3x a week (MWF) - 1h/session: starting at 8-9 am.  He started to see [name] (nutritionist)  - + Mild CKD, last BUN/creatinine:  Lab Results  Component Value Date   BUN 12 02/07/2018   CREATININE 1.15 02/07/2018  On Benicar 40.  - + HL;  last set of lipids: Lab Results  Component Value Date   CHOL 74 (L) 02/07/2018   HDL 45 02/07/2018   LDLCALC 13 02/07/2018   LDLDIRECT 18.0 06/13/2017   TRIG 82 02/07/2018   CHOLHDL 1.6 02/07/2018  He was in the Reveal Study x 4 years >> stopped. He is on Lipitor + omega-3 fatty acids.  - last eye exam was 10/2017: No DR ([name]).  She has incipient cataracts.  - Denies numbness and tingling in his feet.  He also has a history of OSA-on CPAP, very compliant , A. Fib-on Plavix and pradaxa, IBD, GERD, vitamin B12 deficiency - previously on im B12, obesity.  She also has hypothyroidism (on LT4)- reviewed latest TSH: Lab Results  Component Value Date   TSH 0.86 06/13/2017   ROS: Constitutional: + weight gain/+ weight loss, no fatigue, no subjective hyperthermia, no subjective hypothermia Eyes: no blurry vision, no xerophthalmia ENT: no sore throat, no nodules palpated in throat, no dysphagia, no odynophagia, no hoarseness Cardiovascular: no CP/+ SOB/no palpitations/no leg swelling Respiratory: no cough/+ SOB/no  wheezing Gastrointestinal: no N/no V/no D/no C/no acid reflux Musculoskeletal: no muscle aches/no joint aches Skin: no rashes, no hair loss Neurological: no tremors/no numbness/no tingling/no dizziness  I reviewed pt's medications, allergies, PMH, social hx, family hx, and changes were documented in the history of present illness. Otherwise, unchanged from my initial visit note.  PE: BP (!) 162/94   Pulse 94   Ht 5' 7"  (1.702 m)   Wt 282 lb (127.9 kg)   SpO2 92%   BMI 44.17 kg/m  Body mass index is 44.17 kg/m. Wt  Readings from Last 3 Encounters:  02/13/18 282 lb (127.9 kg)  02/07/18 274 lb 12.8 oz (124.6 kg)  01/25/18 268 lb 6.4 oz (121.7 kg) >> he feels this was not accurate   Constitutional: overweight, in NAD Eyes: PERRLA, EOMI, no exophthalmos ENT: moist mucous membranes, no thyromegaly, no cervical lymphadenopathy Cardiovascular: RRR, No MRG, + LE swelling- wears compression hoses -  Respiratory: CTA B Gastrointestinal: abdomen soft, NT, ND, BS+ Musculoskeletal: no deformities, strength intact in all 4 Skin: moist, warm, no rashes Neurological: no tremor with outstretched hands, DTR normal in all 4  ASSESSMENT: 1. DM2, non-insulin-dependent, uncontrolled, with complications - CAD, s/p MI 10/2006 - s/p stent - sees [name]  2. Obesity class 3 BMI Classification:  < 18.5 underweight   18.5-24.9 normal weight   25.0-29.9 overweight   30.0-34.9 class I obesity   35.0-39.9 class II obesity   ? 40.0 class III obesity   3. HL  PLAN:  1. Patient with long-standing, type 2 diabetes, with fair control on oral antidiabetic regimen.  He continues to lose weight, although he had some variability in the last 2 months.  We again discussed about reducing his eggs intake and also diet drinks.  He started to do so and also started to see a nutritionist and is greatly helped.  Sugars are much better in the last 2 weeks so based on these, we do not need to change his regimen for now - We will continue Jardiance qod 2/2 increased urination. He  is also on Lasix. - His measured HbA1c levels are less accurate for him compared to HbA1c levels calculated from fructosamine  - I advised him to Patient Instructions  Please continue: - Glipizide 10 mg 2x a day - Metformin 1000 mg 2x a day - Januvia 100 mg daily    - Jardiance 10 mg daily in am  Please stop at the lab.  Please return in 4 months with your sugar log.   - today will check a fructosamine level - continue checking sugars at  different times of the day - check 1x a day, rotating checks - advised for yearly eye exams >> he is UTD - Return to clinic in 4 mo with sugar log      2. Obesity class 3 - Before last visit, he lost 9 pounds but since then, he gained them back + 3 lbs - Now changed his diet to include more fruit, less fatty and fried foods; which will also help  3. HL - Reviewed latest lipid panel from 02/2018: Low LDL and total cholesterol - Continues the Lipitor and omega-3 fatty acids without side effects.  Office Visit on 02/13/2018  Component Date Value Ref Range Status  . Fructosamine 02/13/2018 277* 190 - 270 umol/L Final   The HbA1c calculated from the fructosamine is excellent, at 6.3%!  Philemon Kingdom, MD PhD [facility]

## 2018-02-14 ENCOUNTER — Ambulatory Visit: Payer: Medicare Other | Admitting: Nutrition

## 2018-02-15 ENCOUNTER — Other Ambulatory Visit: Payer: Self-pay | Admitting: Family Medicine

## 2018-02-15 ENCOUNTER — Telehealth: Payer: Self-pay | Admitting: Family Medicine

## 2018-02-15 ENCOUNTER — Encounter: Payer: Self-pay | Admitting: Sports Medicine

## 2018-02-15 LAB — FRUCTOSAMINE: FRUCTOSAMINE: 277 umol/L — AB (ref 190–270)

## 2018-02-15 NOTE — Telephone Encounter (Unsigned)
Copied from [place] [id number]. Topic: Quick Communication - Rx Refill/Question >> Feb 15, 2018  9:30 AM Carolyn Stare wrote: Medication:clopidogrel (PLAVIX) 75 MG tablet     Has the patient contacted their pharmacy yes     Preferred Pharmacy  [facility]: Please be advised that RX refills may take up to 3 business days. We ask that you follow-up with your pharmacy.

## 2018-02-16 NOTE — Telephone Encounter (Signed)
Dewaine Oats from [facility] called to check status. Advised pending provider approval.

## 2018-02-16 NOTE — Telephone Encounter (Signed)
Dr. Yong Channel , okay to refill pt's Plavix?

## 2018-02-16 NOTE — Telephone Encounter (Signed)
See note

## 2018-02-16 NOTE — Telephone Encounter (Signed)
[name], pts granddaughter, called to f/u on Plavix RX. They have been requesting since 02/08/18. They have received the Januvia. Pharmacy told her they have contacted the office electrically and by phone. Pt will be out tomorrow 02/17/18.  [place], [place] [phone] (Phone) [phone] (Fax)

## 2018-02-16 NOTE — Telephone Encounter (Signed)
Rx is pended for provider review.

## 2018-02-23 ENCOUNTER — Other Ambulatory Visit: Payer: Self-pay | Admitting: Gastroenterology

## 2018-03-07 ENCOUNTER — Other Ambulatory Visit: Payer: Self-pay

## 2018-03-07 DIAGNOSIS — E1151 Type 2 diabetes mellitus with diabetic peripheral angiopathy without gangrene: Secondary | ICD-10-CM

## 2018-03-07 MED ORDER — SITAGLIPTIN PHOSPHATE 100 MG PO TABS: ORAL_TABLET | ORAL | 2 refills | Status: DC

## 2018-03-14 ENCOUNTER — Encounter: Payer: Self-pay | Admitting: Gastroenterology

## 2018-03-14 ENCOUNTER — Ambulatory Visit (INDEPENDENT_AMBULATORY_CARE_PROVIDER_SITE_OTHER): Payer: Medicare Other | Admitting: Gastroenterology

## 2018-03-14 VITALS — BP 132/84 | HR 72 | Ht 69.0 in | Wt 276.2 lb

## 2018-03-14 DIAGNOSIS — I25118 Atherosclerotic heart disease of native coronary artery with other forms of angina pectoris: Secondary | ICD-10-CM

## 2018-03-14 DIAGNOSIS — K5100 Ulcerative (chronic) pancolitis without complications: Secondary | ICD-10-CM | POA: Diagnosis not present

## 2018-03-14 DIAGNOSIS — Z7901 Long term (current) use of anticoagulants: Secondary | ICD-10-CM | POA: Diagnosis not present

## 2018-03-14 MED ORDER — MESALAMINE ER 0.375 G PO CP24: 1.5000 g | ORAL_CAPSULE | Freq: Every day | ORAL | 11 refills | Status: DC

## 2018-03-14 NOTE — Patient Instructions (Signed)
We have sent the following medications to your pharmacy for you to pick up at your convenience: Apriso.   Normal BMI (Body Mass Index- based on height and weight) is between 23 and 30. Your BMI today is Body mass index is 40.79 kg/m. Marland Kitchen Please consider follow up  regarding your BMI with your Primary Care Provider.  Thank you for choosing me and [facility].  Pricilla Riffle. Dagoberto Ligas., MD., [facility]

## 2018-03-14 NOTE — Progress Notes (Signed)
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  He relates occasional loose stools related to oatmeal or sweets.  Otherwise no digestive complaints.  He has permanent atrial fibrillation, chronic combined systolic and diastolic heart failure, coronary artery disease, OSA and DM. He remains on Pradaxa and Plavix.  No Known Allergies Outpatient Medications Prior to Visit  Medication Sig Dispense Refill  . atorvastatin (LIPITOR) 20 MG tablet Take 1 tablet (20 mg total) by mouth daily. Please call and schedule an appointment for further refills 1st attempt 90 tablet 0  . carvedilol (COREG) 25 MG tablet TAKE ONE TABLET TWICE DAILY WITH A MEAL 60 tablet 10  . clopidogrel (PLAVIX) 75 MG tablet TAKE ONE (1) TABLET EACH DAY 90 tablet 1  . fish oil-omega-3 fatty acids 1000 MG capsule Take 2 g by mouth daily.     . furosemide (LASIX) 40 MG tablet TAKE ONE (1) TABLET BY MOUTH EVERY DAY 30 tablet 7  . glipiZIDE (GLUCOTROL) 10 MG tablet TAKE ONE TABLET TWICE DAILY 180 tablet 2  . glucosamine-chondroitin 500-400 MG tablet Take 1 tablet by mouth daily as needed (knee flare up or pain).    Marland Kitchen glucose blood test strip Use as instructed to test 1 time daily 100 each 12  . isosorbide mononitrate (IMDUR) 60 MG 24 hr tablet TAKE ONE (1) TABLET BY MOUTH EVERY DAY 90 tablet 3  . JARDIANCE 10 MG TABS tablet TAKE ONE TABLET BY MOUTH EVERY OTHER DAY 30 tablet 3  . metFORMIN (GLUCOPHAGE) 1000 MG tablet TAKE ONE TABLET TWICE DAILY WITH A MEAL 180 tablet 1  . potassium chloride SA (K-DUR,KLOR-CON) 20 MEQ tablet TAKE ONE (1) TABLET BY MOUTH EVERY DAY 90 tablet 1  . PRADAXA 150 MG CAPS capsule TAKE ONE CAPSULE BY MOUTH TWICE A DAY 60 capsule 3  . sitaGLIPtin (JANUVIA) 100 MG tablet TAKE ONE (1) TABLET EACH DAY 90 tablet 2  . spironolactone (ALDACTONE) 25 MG tablet TAKE ONE-HALF TABLET BY MOUTH ONCE A DAY 45 tablet 3  . SYNTHROID 50 MCG tablet Take 1 tablet (50 mcg total) by mouth daily  before breakfast. 90 tablet 3  . telmisartan (MICARDIS) 40 MG tablet Take 0.5 tablets (20 mg total) by mouth daily. 90 tablet 1  . terbinafine (LAMISIL) 250 MG tablet Take 1 tablet (250 mg total) by mouth daily. 45 tablet 0  . APRISO 0.375 g 24 hr capsule TAKE 4 CAPSULES BY MOUTH DAILY. 120 capsule 0  . diclofenac sodium (VOLTAREN) 1 % GEL Apply 2 g topically 4 (four) times daily as needed (knee pain).    Marland Kitchen ketoconazole (NIZORAL) 2 % cream Apply 1 application topically daily. 30 g 0   No facility-administered medications prior to visit.    Past Medical History:  Diagnosis Date  . Atrial fibrillation ([place])    initial diagnoses 2012  . B12 deficiency    per patient previously taking shots  . CORONARY ARTERY DISEASE 04/12/2007   2 stents last in 2006.   Marland Kitchen DIABETES MELLITUS, TYPE II 04/16/2007  . Diverticulosis of colon (without mention of hemorrhage)   . HYPERLIPIDEMIA 04/16/2007   reveal study. not sure of medication  . HYPERTENSION 04/12/2007  . HYPOTHYROIDISM 04/12/2007  . Ischemic cardiomyopathy    cath 35-40%  . MYOCARDIAL INFARCTION, HX OF 04/12/2007  . OBESITY 06/16/2009  . SLEEP APNEA 04/12/2007   CPAP  . Stroke ([place])   . ULCERATIVE COLITIS, LEFT SIDED 11/23/2010   Past  Surgical History:  Procedure Laterality Date  . CARDIAC CATHETERIZATION  10/2002   STENT. 2 stents Dr. Maurene Capes  . CARDIAC CATHETERIZATION N/A 10/28/2015   Procedure: Right/Left Heart Cath and Coronary Angiography;  Surgeon: Sherren Mocha, MD;  Location: [facility];  Service: Cardiovascular;  Laterality: N/A;  . CORONARY STENT PLACEMENT  2008   LAD   . TONSILLECTOMY     Social History   Socioeconomic History  . Marital status: Married    Spouse name: Not on file  . Number of children: 1  . Years of education: Not on file  . Highest education level: Not on file  Occupational History  . Occupation: RETIRED    Employer: RETIRED  Social Needs  . Financial resource strain: Not on file  . Food insecurity:     Worry: Not on file    Inability: Not on file  . Transportation needs:    Medical: Not on file    Non-medical: Not on file  Tobacco Use  . Smoking status: Former Smoker    Packs/day: 1.00    Years: 5.00    Pack years: 5.00    Types: Cigarettes    Last attempt to quit: 04/13/1966    Years since quitting: 51.9  . Smokeless tobacco: Never Used  Substance and Sexual Activity  . Alcohol use: No    Alcohol/week: 0.0 oz    Comment: no more beer  . Drug use: No  . Sexual activity: Not on file  Lifestyle  . Physical activity:    Days per week: Not on file    Minutes per session: Not on file  . Stress: Not on file  Relationships  . Social connections:    Talks on phone: Not on file    Gets together: Not on file    Attends religious service: Not on file    Active member of club or organization: Not on file    Attends meetings of clubs or organizations: Not on file    Relationship status: Not on file  Other Topics Concern  . Not on file  Social History Narrative   Cardiorehab 3 days a week. 45 minutes to an hour-stationary bike.    GRANDDAUGHTER (MS. 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       Married for 37 years in 2015, married previously for 14 years. Daughter with first wife and 3 grandkids.    Lives alone with wife. Get to see grandkids a lot. New grandchild in middle of 19      Hobbies-yardwork, previously liked to hunt and fish, does some target shooting   Family History  Problem Relation Age of Onset  . Heart attack Mother        mid 25s  . Heart disease Father        H/O CAD, CABG, VALVE SURGERY  . Hypertension Brother   . Obesity Brother   . Stomach cancer Maternal Aunt   . Stomach cancer Paternal Grandmother        ? colon        Physical Exam: General: Well developed, well nourished, obese, no acute distress Head: Normocephalic and atraumatic Eyes:   sclerae anicteric, EOMI Ears: Normal auditory acuity Mouth: No deformity or lesions Lungs: Clear throughout to auscultation Heart: Regular rate and rhythm; no murmurs, rubs or bruits Abdomen: Soft, non tender and  non distended. No masses, hepatosplenomegaly or hernias noted. Normal Bowel sounds Musculoskeletal: Symmetrical with no gross deformities  Pulses:  Normal pulses noted Extremities: No clubbing, cyanosis, edema or deformities noted Neurological: Alert oriented x 4, grossly nonfocal Psychological:  Alert and cooperative. Normal mood and affect  Assessment and Recommendations:  1. Ulcerative pancolitis. Continue Apriso 1.5 g daily.  We again discussed surveillance colonoscopy.  He has an appointment with [name] in July. Will ask [name] to review the risks of a temporary hold of Pradaxa and Plavix for colonoscopy with biopsies.  Patient remains concerned about his cardiac risk factors and potential complications upon holding anticoagulation with colonoscopy.  He is advised to call us after his appointment with [name] if he would like to proceed with colonoscopy.  See 02/06/2017 office note. REV in 1 years.   2.  Permanent atrial fibrillation maintained on Pradaxa.  3.  Coronary artery disease maintained on Plavix.  4.  Combined systolic and diastolic heart failure: LV EF 40 - 45%  5.  Obstructive sleep apnea.

## 2018-03-19 ENCOUNTER — Telehealth: Payer: Self-pay | Admitting: Cardiovascular Disease

## 2018-03-19 NOTE — Telephone Encounter (Signed)
Pt's granddaughter calling to see if pt can increase his Lasix, he doesn't weigh so not sure of wt gain but his legs and ankles have been swollen since Saturday pls call Sharyn Lull at [phone]

## 2018-03-19 NOTE — Telephone Encounter (Signed)
[name] states he has some "pretty bad swelling" in his feet, ankles, and legs." He is also a little more SOB than usual.  He states he ate a LOT of sodium this weekend. He knew he should not have eaten the processed food he did, but he was hungry and didn't have any other choices. Per [name], offered to increase Lasix to 40 mg BID, but patient declined. He states he "pees too much as it is." Instead, he will take Lasix 40 mg BID for 3-4 days and will call Monday if swelling is not better. He will monitor and limit salt intake and will elevate his legs when sitting. He has an appointment with [name] in a couple weeks.  He was grateful for call and agrees with treatment plan.

## 2018-03-20 ENCOUNTER — Telehealth: Payer: Self-pay | Admitting: Internal Medicine

## 2018-03-26 ENCOUNTER — Ambulatory Visit: Payer: Medicare Other | Admitting: Nutrition

## 2018-04-06 ENCOUNTER — Other Ambulatory Visit: Payer: Self-pay | Admitting: Cardiovascular Disease

## 2018-04-06 DIAGNOSIS — E785 Hyperlipidemia, unspecified: Secondary | ICD-10-CM

## 2018-04-09 ENCOUNTER — Ambulatory Visit (INDEPENDENT_AMBULATORY_CARE_PROVIDER_SITE_OTHER): Payer: Medicare Other | Admitting: Cardiovascular Disease

## 2018-04-09 ENCOUNTER — Encounter: Payer: Self-pay | Admitting: Cardiovascular Disease

## 2018-04-09 VITALS — BP 152/98 | HR 96 | Ht 69.0 in | Wt 274.0 lb

## 2018-04-09 DIAGNOSIS — I4821 Permanent atrial fibrillation: Secondary | ICD-10-CM

## 2018-04-09 DIAGNOSIS — I25118 Atherosclerotic heart disease of native coronary artery with other forms of angina pectoris: Secondary | ICD-10-CM

## 2018-04-09 DIAGNOSIS — I482 Chronic atrial fibrillation: Secondary | ICD-10-CM | POA: Diagnosis not present

## 2018-04-09 DIAGNOSIS — I5043 Acute on chronic combined systolic (congestive) and diastolic (congestive) heart failure: Secondary | ICD-10-CM | POA: Diagnosis not present

## 2018-04-09 MED ORDER — TELMISARTAN 40 MG PO TABS: 40.0000 mg | ORAL_TABLET | Freq: Every day | ORAL | 3 refills | Status: DC

## 2018-04-09 MED ORDER — SPIRONOLACTONE 25 MG PO TABS: 25.0000 mg | ORAL_TABLET | Freq: Every day | ORAL | 3 refills | Status: DC

## 2018-04-09 NOTE — Progress Notes (Signed)
Cardiology Office Note Date:  04/09/2018   ID:  [name], [name] [birth year], MRN [id number]  PCP:  Andrew Olp, MD  Cardiologist:  Sherren Mocha, MD    Chief Complaint  Patient presents with  . Shortness of Breath     History of Present Illness: [name] is a 72 y.o. male who presents for follow-up of permanent atrial fibrillation, coronary artery disease, and chronic combined systolic and diastolic heart failure.  He underwent stenting of the LAD in 2004 with a 2.5 x 28 mm Cypher DES.  He presented in 2008 with an anterior STEMI secondary to a very late stent thrombosis.  IVUS demonstrated an under deployed stent and the stent was dilated to 3.25 mm with a good result.  The patient's last heart catheterization in 2017 demonstrated continued patency of the stented segment in his LAD, severe ostial stenosis of the left circumflex not amenable to PCI, and a patent nondominant right coronary artery supplying collaterals to the diagonal branch of the LAD. Ongoing medical therapy was recommended at that time. Other comorbid conditions include obstructive sleep apnea on CPAP, morbid obesity, type 2 diabetes, hypertension, and hyperlipidemia. The patient also has a history of stent thrombosis and has been maintained on long-term clopidogrel.  The patient is here with his wife today. He has had some problems with leg swelling. Notes that he had to cut back on Jardiance because he was 'peeing too much.' Also was treated with a steroid injection in the knee and has had high blood pressure and hyperglycemia since then. Feels like the effects of the steroid are wearing off and his BP is getting better. BP at cardiac rehab is ranging about 130/70's.  He is short of breath with walking fairly short distances.  He denies cough, orthopnea, or PND.  He wears CPAP at night.  [place] the beach because of salt discretion, this is improving.  He has mild discomfort in the chest when he  exerts himself.  He is able to fully dissipate cardiac rehab without chest discomfort at that level of activity.  Past Medical History:  Diagnosis Date  . Atrial fibrillation ([place])    initial diagnoses 2012  . B12 deficiency    per patient previously taking shots  . CORONARY ARTERY DISEASE 04/12/2007   2 stents last in 2006.   Marland Kitchen DIABETES MELLITUS, TYPE II 04/16/2007  . Diverticulosis of colon (without mention of hemorrhage)   . HYPERLIPIDEMIA 04/16/2007   reveal study. not sure of medication  . HYPERTENSION 04/12/2007  . HYPOTHYROIDISM 04/12/2007  . Ischemic cardiomyopathy    cath 35-40%  . MYOCARDIAL INFARCTION, HX OF 04/12/2007  . OBESITY 06/16/2009  . SLEEP APNEA 04/12/2007   CPAP  . Stroke ([place])   . ULCERATIVE COLITIS, LEFT SIDED 11/23/2010    Past Surgical History:  Procedure Laterality Date  . CARDIAC CATHETERIZATION  10/2002   STENT. 2 stents Dr. Maurene Capes  . CARDIAC CATHETERIZATION N/A 10/28/2015   Procedure: Right/Left Heart Cath and Coronary Angiography;  Surgeon: Sherren Mocha, MD;  Location: [facility];  Service: Cardiovascular;  Laterality: N/A;  . CORONARY STENT PLACEMENT  2008   LAD   . TONSILLECTOMY      Current Outpatient Medications  Medication Sig Dispense Refill  . carvedilol (COREG) 25 MG tablet TAKE ONE TABLET TWICE DAILY WITH A MEAL 60 tablet 10  . clopidogrel (PLAVIX) 75 MG tablet TAKE ONE (1) TABLET EACH DAY 90 tablet 1  .  fish oil-omega-3 fatty acids 1000 MG capsule Take 2 g by mouth daily.     . furosemide (LASIX) 40 MG tablet TAKE ONE (1) TABLET BY MOUTH EVERY DAY 30 tablet 7  . glipiZIDE (GLUCOTROL) 10 MG tablet TAKE ONE TABLET TWICE DAILY 180 tablet 2  . glucosamine-chondroitin 500-400 MG tablet Take 1 tablet by mouth daily as needed (knee flare up or pain).    Marland Kitchen glucose blood test strip Use as instructed to test 1 time daily 100 each 12  . isosorbide mononitrate (IMDUR) 60 MG 24 hr tablet TAKE ONE (1) TABLET BY MOUTH EVERY DAY 90 tablet 3  . JARDIANCE  10 MG TABS tablet TAKE ONE TABLET BY MOUTH EVERY OTHER DAY 30 tablet 3  . mesalamine (APRISO) 0.375 g 24 hr capsule Take 4 capsules (1.5 g total) by mouth daily. 120 capsule 11  . metFORMIN (GLUCOPHAGE) 1000 MG tablet TAKE ONE TABLET TWICE DAILY WITH A MEAL 180 tablet 1  . potassium chloride SA (K-DUR,KLOR-CON) 20 MEQ tablet TAKE ONE (1) TABLET BY MOUTH EVERY DAY 90 tablet 1  . PRADAXA 150 MG CAPS capsule TAKE ONE CAPSULE BY MOUTH TWICE A DAY 60 capsule 3  . sitaGLIPtin (JANUVIA) 100 MG tablet TAKE ONE (1) TABLET EACH DAY 90 tablet 2  . spironolactone (ALDACTONE) 25 MG tablet Take 1 tablet (25 mg total) by mouth daily. 90 tablet 3  . SYNTHROID 50 MCG tablet Take 1 tablet (50 mcg total) by mouth daily before breakfast. 90 tablet 3  . telmisartan (MICARDIS) 40 MG tablet Take 1 tablet (40 mg total) by mouth daily. 90 tablet 3  . atorvastatin (LIPITOR) 20 MG tablet Take 1 tablet (20 mg total) by mouth daily. 90 tablet 3   No current facility-administered medications for this visit.     Allergies:   Patient has no known allergies.   Social History:  The patient  reports that he quit smoking about 52 years ago. His smoking use included cigarettes. He has a 5.00 pack-year smoking history. He has never used smokeless tobacco. He reports that he does not drink alcohol or use drugs.   Family History:  The patient's family history includes Heart attack in his mother; Heart disease in his father; Hypertension in his brother; Obesity in his brother; Stomach cancer in his maternal aunt and paternal grandmother.    ROS:  Please see the history of present illness.  Otherwise, review of systems is positive for hearing loss.  All other systems are reviewed and negative.    PHYSICAL EXAM: VS:  BP (!) 152/98   Pulse 96   Ht 5' 9"  (1.753 m)   Wt 274 lb (124.3 kg)   SpO2 94%   BMI 40.46 kg/m  , BMI Body mass index is 40.46 kg/m. GEN: pleasant, morbidly obese male, in no acute distress  HEENT: normal    Neck: no JVD, no masses. No carotid bruits Cardiac: irregularly irregular without murmur or gallop                Respiratory:  clear to auscultation bilaterally, normal work of breathing GI: soft, nontender, nondistended, + BS MS: no deformity or atrophy  Ext: 1+ bilateral pretibial edema Skin: warm and dry, no rash Neuro:  Strength and sensation are intact Psych: euthymic mood, full affect  EKG:  EKG is not ordered today.  Recent Labs: 06/13/2017: Hemoglobin 14.3; Platelets 169.0; TSH 0.86 02/07/2018: ALT 8; BUN 12; Creatinine, Ser 1.15; Potassium 4.4; Sodium 141   Lipid  Panel     Component Value Date/Time   CHOL 74 (L) 02/07/2018 0859   TRIG 82 02/07/2018 0859   HDL 45 02/07/2018 0859   CHOLHDL 1.6 02/07/2018 0859   CHOLHDL 1.7 10/25/2015 0518   VLDL 16 10/25/2015 0518   LDLCALC 13 02/07/2018 0859   LDLDIRECT 18.0 06/13/2017 1504      Wt Readings from Last 3 Encounters:  04/09/18 274 lb (124.3 kg)  03/14/18 276 lb 4 oz (125.3 kg)  02/13/18 282 lb (127.9 kg)     Cardiac Studies Reviewed: Cardiac cath reports are reviewed 2004, 2008, and 2017.  His last echocardiogram from 2017 is also reviewed demonstrating an LVEF 40 to 45%.  ASSESSMENT AND PLAN: 1.  Permanent atrial fibrillation: Heart rate is controlled.  Tolerating anticoagulation with Pradaxa.  No problems noted.  It would be reasonable for him to hold Pradaxa for 3 days if needed for colonoscopy.  2.  Coronary artery disease, native vessel, with angina symptoms are controlled on isosorbide and carvedilol.  He has been maintained on long-term clopidogrel.  If he requires colonoscopy, his risk of a significant coronary event would be low with a short interruption of clopidogrel (5 days).  He does have a history of stent thrombosis but this was over 10 years ago and his LAD stent was redilated under comments of intravascular ultrasound.  His last cardiac catheterization demonstrated patency of his stent site.  3.  Acute  on chronic combined systolic diastolic heart failure: Patient has New York Heart Association functional class III symptoms.  I suspect this is multifactorial considering his morbid obesity.  He does not appear grossly volume overloaded other chronic leg edema.  His weight is stable and has been much higher.  I have recommended increasing Spironolactone to 25 mg daily.  Will check a metabolic panel in 2 weeks he will continue on all, furosemide, telmisartan.  4.  Hypertension, controlled: Increase Spironolactone telmisartan.  Continue other medications without change.  Current medicines are reviewed with the patient today.  The patient does not have concerns regarding medicines.  Labs/ tests ordered today include:   Orders Placed This Encounter  Procedures  . Basic metabolic panel  . Pro b natriuretic peptide (BNP)  . ECHOCARDIOGRAM COMPLETE   Disposition:   FU 3 months APP< 6 months with me  Signed, Sherren Mocha, MD  04/09/2018 5:48 PM    [facility] [address] [place] [address], [place], [place]  [address] Phone: [phone]; Fax: [phone]

## 2018-04-09 NOTE — Patient Instructions (Signed)
Medication Instructions:  1) INCREASE MICARDIS (telmisartan) to a whole tablet (40 mg) daily 2) INCREASE ALDACTONE (spironolactone) to a whole tablet (25 mg) daily   Labwork: You have a lab appointment 7/17 (the same day as your echo). You do not need to be fasting.  Testing/Procedures: Your provider has requested that you have an echocardiogram. Echocardiography is a painless test that uses sound waves to create images of your heart. It provides your doctor with information about the size and shape of your heart and how well your heart's chambers and valves are working. This procedure takes approximately one hour. There are no restrictions for this procedure. Your echo is scheduled 04/25/2018. Please arrive by 8:00AM.   Follow-Up: Your provider recommends that you schedule a follow-up appointment in [place] with Richardson Dopp, PA. You have been scheduled 07/11/2018 at 8:15AM.   Your provider wants you to follow-up in: 6 months with [name]. You will receive a reminder letter in the mail two months in advance. If you don't receive a letter, please call our office to schedule the follow-up appointment.    Any Other Special Instructions Will Be Listed Below (If Applicable).     If you need a refill on your cardiac medications before your next appointment, please call your pharmacy.

## 2018-04-18 ENCOUNTER — Ambulatory Visit: Payer: Medicare Other | Admitting: Sports Medicine

## 2018-04-18 ENCOUNTER — Other Ambulatory Visit: Payer: Self-pay | Admitting: Cardiovascular Disease

## 2018-04-19 ENCOUNTER — Encounter: Payer: Self-pay | Admitting: Sports Medicine

## 2018-04-19 ENCOUNTER — Ambulatory Visit (INDEPENDENT_AMBULATORY_CARE_PROVIDER_SITE_OTHER): Payer: Medicare Other | Admitting: Sports Medicine

## 2018-04-19 VITALS — BP 152/88 | HR 97 | Ht 69.0 in | Wt 273.8 lb

## 2018-04-19 DIAGNOSIS — M25562 Pain in left knee: Secondary | ICD-10-CM | POA: Diagnosis not present

## 2018-04-19 DIAGNOSIS — M1712 Unilateral primary osteoarthritis, left knee: Secondary | ICD-10-CM

## 2018-04-19 DIAGNOSIS — I25118 Atherosclerotic heart disease of native coronary artery with other forms of angina pectoris: Secondary | ICD-10-CM

## 2018-04-19 NOTE — Progress Notes (Signed)
[name]. Avanti Jetter, [place] [facility] [place]  SHAHIN KNIERIM - 72 y.o. male MRN [id number]  Date of birth: 1946/09/11  Visit Date: 04/19/2018  PCP: Marin Olp, MD   Referred by: Marin Olp, MD  Scribe(s) for today's visit: Wendy Poet, LAT, ATC  SUBJECTIVE:  [name] is here for Follow-up (L knee pain) .    Notes from initial visit on 01/10/18: His Left leg/knee pain symptoms INITIALLY: Began 2-3 weeks ago. He was walking across the driveway when he felt and heard a pop in his L knee.  Described as mild at rest, moderate-severe with activity.Pain is described as sharp throbbing like a toothache, nonradiating Worsened with going from sit to stand, weight bearing, twisting/turning.  Improved with rest. Additional associated symptoms include: Initial injury was about 2-3 weeks ago, it improved, and then flared up again over the past few days. He has noticed some swelling around the knee. Knee pain is generalized, not specific area hurts.     At this time symptoms show no change compared to onset  He has been taking tylenol arthritis with some relief.   He has been treated at [facility] in the past, 2-3 years ago, and received steroid injection. They have discussed knee replacement but cardiologist (Dr Blane Ohara) was not in agreement.  No recent XR of L knee.   04/19/18: Compared to the last office visit on 01/10/18, his previously described L knee pain symptoms are improving w/ no pain noted and no swelling. Current symptoms are mild- non existent & are nonradiating He has not been taking any medications for his L knee.  L knee XR - 01/10/18   REVIEW OF SYSTEMS: Denies fevers, chills, recent weight gain or weight loss.  No night sweats. No significant nighttime awakenings due to this issue.   HISTORY & PERTINENT PRIOR DATA:  Prior History reviewed and updated per electronic medical record.    Significant/pertinent history, findings, studies include:  reports that he quit smoking about 52 years ago. His smoking use included cigarettes. He has a 5.00 pack-year smoking history. He has never used smokeless tobacco. Recent Labs    06/16/17 1014  HGBA1C 8.8   01/18/2018 - Zilretta injection - no PA required, deductible has been met, pt responsibility is 20%. - BSC Problem  Primary Osteoarthritis of Left Knee   L knee arthroscopic "clean out" 1994, told needed knee replacement but cardiology has advised against. Trial voltaren gel.      OBJECTIVE:  VS:  HT:_0  (175.3 cm)   WT:273 lb 12.8 oz (124.2 kg)  BMI:40.41    BP:(Abnormal) 152/88  HR:97bpm  TEMP: ( )  RESP:94 %   PHYSICAL EXAM: Constitutional: WDWN, Non-toxic appearing. Psychiatric: Alert & appropriately interactive.  Not depressed or anxious appearing. Respiratory: No increased work of breathing.  Trachea Midline Eyes: Pupils are equal.  EOM intact without nystagmus.  No scleral icterus  Vascular Exam: warm to touch no edema  Left knee is overall well aligned with a well-healed postsurgical incision on the knee.  He has very small amount of synovitis and osteoarthritic bossing but no significant pain and overall good range of motion.  Extensor mechanism intact. ASSESSMENT & PLAN:   1. Left knee pain, unspecified chronicity   2. Primary osteoarthritis of left knee     PLAN: He is done quite well following the last injection.  He will plan to follow-up with as  needed can consider Visco supplementation and/or Zilretta injections in the future but given how well he is done with his recent steroid we will plan to repeat this one additional time when needed.  Follow-up: Return if symptoms worsen or fail to improve.        Please see additional documentation for Objective, Assessment and Plan sections. Pertinent additional documentation may be included in corresponding procedure notes, imaging studies, problem  based documentation and patient instructions. Please see these sections of the encounter for additional information regarding this visit.  CMA/ATC served as Education administrator during this visit. History, Physical, and Plan performed by medical provider. Documentation and orders reviewed and attested to.      Gerda Diss, [facility] Physician

## 2018-04-19 NOTE — Telephone Encounter (Signed)
Pt last saw Dr Burt Knack 04/09/18, last labs 02/07/18 Creat 1.15, age 72, weight 124.3kg, CrCl 102.08, based on CrCl pt is on appropriate dosage of Pradaxa 119m BID.  Will refill rx.

## 2018-04-23 ENCOUNTER — Ambulatory Visit: Payer: Medicare Other

## 2018-04-25 ENCOUNTER — Other Ambulatory Visit: Payer: Medicare Other | Admitting: *Deleted

## 2018-04-25 ENCOUNTER — Other Ambulatory Visit: Payer: Self-pay

## 2018-04-25 ENCOUNTER — Ambulatory Visit (HOSPITAL_COMMUNITY): Payer: Medicare Other | Attending: Cardiology

## 2018-04-25 DIAGNOSIS — I2510 Atherosclerotic heart disease of native coronary artery without angina pectoris: Secondary | ICD-10-CM | POA: Diagnosis not present

## 2018-04-25 DIAGNOSIS — I071 Rheumatic tricuspid insufficiency: Secondary | ICD-10-CM | POA: Insufficient documentation

## 2018-04-25 DIAGNOSIS — G4733 Obstructive sleep apnea (adult) (pediatric): Secondary | ICD-10-CM | POA: Diagnosis not present

## 2018-04-25 DIAGNOSIS — I5043 Acute on chronic combined systolic (congestive) and diastolic (congestive) heart failure: Secondary | ICD-10-CM

## 2018-04-25 DIAGNOSIS — I252 Old myocardial infarction: Secondary | ICD-10-CM | POA: Diagnosis not present

## 2018-04-25 DIAGNOSIS — I509 Heart failure, unspecified: Secondary | ICD-10-CM | POA: Diagnosis not present

## 2018-04-25 DIAGNOSIS — I429 Cardiomyopathy, unspecified: Secondary | ICD-10-CM | POA: Diagnosis not present

## 2018-04-25 DIAGNOSIS — Z87891 Personal history of nicotine dependence: Secondary | ICD-10-CM | POA: Insufficient documentation

## 2018-04-25 DIAGNOSIS — Z8249 Family history of ischemic heart disease and other diseases of the circulatory system: Secondary | ICD-10-CM | POA: Diagnosis not present

## 2018-04-25 DIAGNOSIS — R0600 Dyspnea, unspecified: Secondary | ICD-10-CM | POA: Diagnosis not present

## 2018-04-25 DIAGNOSIS — I313 Pericardial effusion (noninflammatory): Secondary | ICD-10-CM | POA: Diagnosis not present

## 2018-04-25 DIAGNOSIS — I371 Nonrheumatic pulmonary valve insufficiency: Secondary | ICD-10-CM | POA: Diagnosis not present

## 2018-04-25 DIAGNOSIS — I639 Cerebral infarction, unspecified: Secondary | ICD-10-CM | POA: Insufficient documentation

## 2018-04-25 DIAGNOSIS — E119 Type 2 diabetes mellitus without complications: Secondary | ICD-10-CM | POA: Diagnosis not present

## 2018-04-25 DIAGNOSIS — I4891 Unspecified atrial fibrillation: Secondary | ICD-10-CM | POA: Diagnosis not present

## 2018-04-25 DIAGNOSIS — E669 Obesity, unspecified: Secondary | ICD-10-CM | POA: Diagnosis not present

## 2018-04-25 DIAGNOSIS — I110 Hypertensive heart disease with heart failure: Secondary | ICD-10-CM | POA: Diagnosis not present

## 2018-04-25 DIAGNOSIS — E785 Hyperlipidemia, unspecified: Secondary | ICD-10-CM | POA: Diagnosis not present

## 2018-04-25 LAB — BASIC METABOLIC PANEL
BUN/Creatinine Ratio: 13 (ref 10–24)
BUN: 14 mg/dL (ref 8–27)
CALCIUM: 9.3 mg/dL (ref 8.6–10.2)
CHLORIDE: 99 mmol/L (ref 96–106)
CO2: 25 mmol/L (ref 20–29)
Creatinine, Ser: 1.09 mg/dL (ref 0.76–1.27)
GFR calc non Af Amer: 67 mL/min/{1.73_m2} (ref >59)
GFR, EST AFRICAN AMERICAN: 78 mL/min/{1.73_m2} (ref >59)
Glucose: 159 mg/dL — ABNORMAL HIGH (ref 65–99)
Potassium: 4.8 mmol/L (ref 3.5–5.2)
Sodium: 141 mmol/L (ref 134–144)

## 2018-04-25 LAB — PRO B NATRIURETIC PEPTIDE: NT-Pro BNP: 1112 pg/mL — ABNORMAL HIGH (ref 0–376)

## 2018-04-25 MED ORDER — PERFLUTREN LIPID MICROSPHERE
1.0000 mL | INTRAVENOUS | Status: AC | PRN
  Administered 2018-04-25: 2 mL via INTRAVENOUS

## 2018-04-27 ENCOUNTER — Encounter: Payer: Self-pay | Admitting: Cardiovascular Disease

## 2018-04-30 MED ORDER — FUROSEMIDE 40 MG PO TABS: 40.0000 mg | ORAL_TABLET | Freq: Two times a day (BID) | ORAL | 11 refills | Status: DC

## 2018-04-30 NOTE — Telephone Encounter (Signed)
Informed patient of results and verbal understanding expressed.  Instructed patient to INCREASE LASIX to 40 mg BID. OV scheduled with Gerrianne Scale 7/31. Patient agrees with treatment plan.

## 2018-04-30 NOTE — Telephone Encounter (Signed)
-----   Message from Sherren Mocha, MD sent at 04/25/2018  2:27 PM EDT ----- LV function much worse than in past. Pt with symptoms of CHF on recent evaluation. Please arrange APP visit to titrate medication - consider Entresto and likely will need to arrange R/L heart cath. thanks

## 2018-04-30 NOTE — Telephone Encounter (Signed)
-----   Message from Sherren Mocha, MD sent at 04/26/2018  7:31 AM EDT ----- Please increase furosemide to 40 mg BID and FU as outlined in echo result note. thanks

## 2018-05-01 ENCOUNTER — Telehealth: Payer: Self-pay | Admitting: Physician Assistant

## 2018-05-01 NOTE — Telephone Encounter (Signed)
New message   *STAT* If patient is at the pharmacy, call can be transferred to refill team.   1. Which medications need to be refilled? (please list name of each medication and dose if known) furosemide (LASIX) 40 MG tablet  2. Which pharmacy/location (including street and city if local pharmacy) is medication to be sent to? [place]  3. Do they need a 30 day or 90 day supply? Pharmacy calling requesting a 90 day supply

## 2018-05-01 NOTE — Telephone Encounter (Signed)
Called pt's pharmacy and gave an verbal order turning the Rx into a 90 day supply with 3 refills. Pharmacy tech verbalized understanding.

## 2018-05-08 NOTE — Progress Notes (Signed)
Cardiology Office Note    Date:  05/09/2018   ID:  [name], [name] 1946-03-06, MRN [id number]  PCP:  Marin Olp, MD  Cardiologist: Sherren Mocha, MD  Chief Complaint  Patient presents with  . Follow-up    History of Present Illness:  [name] is a 72 y.o. male with history of coronary artery disease status post DES to the LAD in 2004, anterior STEMI 2008 secondary to very late stent thrombosis.  IVIS demonstrated an under deployed stent and the stent was dilated to 3.25 mm with good result.  Last heart cath 2017 patent stent in the LAD, severe ostial stenosis of the left circumflex not amenable to PCI, patent nondominant RCA supplying collaterals to the diagonal branch to the LAD.  Medical therapy recommended at that time including long-term Plavix.  Patient also has chronic atrial fibrillation, chronic combined systolic and diastolic CHF, OSA on CPAP, morbid obesity, hypertension, HLD and type 2 diabetes mellitus.  Patient saw [name] 04/09/2018 complaining of worsening dyspnea on exertion and edema felt to have New York Heart Association functional class III symptoms.  Spironolactone was increased to 25 mg daily.  BNP was high at 1112 and 2D echo showed LVEF 25 to 30% with moderate to severe LVH, grade 3 DD, when compared to prior echo there was worsening of the LV and diastolic function with hypokinesis of the entire myocardium with akinetic segments mid apical anterior, mid anterior septal, apical lateral and apical myocardium.    Elevated right heart pressures.  Last echo in 2017 LVEF 40 to 45%.  Lasix was increased to 40 mg twice daily and [name] recommended he follow-up for further titration of medications, consider Entresto.  He felt he eventually need right and left cardiac catheterization.  Patient comes in accompanied by his granddaughter who's an Therapist, sports at [facility]. He feels much better on increased Lasix. Walked in from parking garage today.  Is able to  walk down to his mailbox and back up the hill without difficulty.  Still doing cardiac rehab.  Denies any chest pain.  He does eat out every meal.  Going to the beach Saturday for a week.  Past Medical History:  Diagnosis Date  . Atrial fibrillation ([place])    initial diagnoses 2012  . B12 deficiency    per patient previously taking shots  . CORONARY ARTERY DISEASE 04/12/2007   2 stents last in 2006.   Marland Kitchen DIABETES MELLITUS, TYPE II 04/16/2007  . Diverticulosis of colon (without mention of hemorrhage)   . HYPERLIPIDEMIA 04/16/2007   reveal study. not sure of medication  . HYPERTENSION 04/12/2007  . HYPOTHYROIDISM 04/12/2007  . Ischemic cardiomyopathy    cath 35-40%  . MYOCARDIAL INFARCTION, HX OF 04/12/2007  . OBESITY 06/16/2009  . SLEEP APNEA 04/12/2007   CPAP  . Stroke ([place])   . ULCERATIVE COLITIS, LEFT SIDED 11/23/2010    Past Surgical History:  Procedure Laterality Date  . CARDIAC CATHETERIZATION  10/2002   STENT. 2 stents Dr. Maurene Capes  . CARDIAC CATHETERIZATION N/A 10/28/2015   Procedure: Right/Left Heart Cath and Coronary Angiography;  Surgeon: Sherren Mocha, MD;  Location: [facility];  Service: Cardiovascular;  Laterality: N/A;  . CORONARY STENT PLACEMENT  2008   LAD   . TONSILLECTOMY      Current Medications: Current Meds  Medication Sig  . atorvastatin (LIPITOR) 20 MG tablet Take 1 tablet (20 mg total) by mouth daily.  . carvedilol (COREG) 25 MG  tablet TAKE ONE TABLET TWICE DAILY WITH A MEAL  . clopidogrel (PLAVIX) 75 MG tablet TAKE ONE (1) TABLET EACH DAY  . fish oil-omega-3 fatty acids 1000 MG capsule Take 2 g by mouth daily.   . furosemide (LASIX) 40 MG tablet Take 1 tablet (40 mg total) by mouth 2 (two) times daily.  Marland Kitchen glipiZIDE (GLUCOTROL) 10 MG tablet TAKE ONE TABLET TWICE DAILY  . glucosamine-chondroitin 500-400 MG tablet Take 1 tablet by mouth daily as needed (knee flare up or pain).  Marland Kitchen glucose blood test strip Use as instructed to test 1 time daily  . isosorbide  mononitrate (IMDUR) 60 MG 24 hr tablet TAKE ONE (1) TABLET BY MOUTH EVERY DAY  . JARDIANCE 10 MG TABS tablet TAKE ONE TABLET BY MOUTH EVERY OTHER DAY  . mesalamine (APRISO) 0.375 g 24 hr capsule Take 4 capsules (1.5 g total) by mouth daily.  . metFORMIN (GLUCOPHAGE) 1000 MG tablet TAKE ONE TABLET TWICE DAILY WITH A MEAL  . potassium chloride SA (K-DUR,KLOR-CON) 20 MEQ tablet TAKE ONE (1) TABLET BY MOUTH EVERY DAY  . PRADAXA 150 MG CAPS capsule TAKE ONE (1) CAPSULE BY MOUTH TWICE A DAY. (EVERY 12 HOURS.)  . sitaGLIPtin (JANUVIA) 100 MG tablet TAKE ONE (1) TABLET EACH DAY  . spironolactone (ALDACTONE) 25 MG tablet Take 1 tablet (25 mg total) by mouth daily.  Marland Kitchen SYNTHROID 50 MCG tablet Take 1 tablet (50 mcg total) by mouth daily before breakfast.  . telmisartan (MICARDIS) 40 MG tablet Take 1 tablet (40 mg total) by mouth daily.     Allergies:   Patient has no known allergies.   Social History   Socioeconomic History  . Marital status: Married    Spouse name: Not on file  . Number of children: 1  . Years of education: Not on file  . Highest education level: Not on file  Occupational History  . Occupation: RETIRED    Employer: RETIRED  Social Needs  . Financial resource strain: Not on file  . Food insecurity:    Worry: Not on file    Inability: Not on file  . Transportation needs:    Medical: Not on file    Non-medical: Not on file  Tobacco Use  . Smoking status: Former Smoker    Packs/day: 1.00    Years: 5.00    Pack years: 5.00    Types: Cigarettes    Last attempt to quit: 04/13/1966    Years since quitting: 52.1  . Smokeless tobacco: Never Used  Substance and Sexual Activity  . Alcohol use: No    Alcohol/week: 0.0 oz    Comment: no more beer  . Drug use: No  . Sexual activity: Not on file  Lifestyle  . Physical activity:    Days per week: Not on file    Minutes per session: Not on file  . Stress: Not on file  Relationships  . Social connections:    Talks on phone:  Not on file    Gets together: Not on file    Attends religious service: Not on file    Active member of club or organization: Not on file    Attends meetings of clubs or organizations: Not on file    Relationship status: Not on file  Other Topics Concern  . Not on file  Social History Narrative   Cardiorehab 3 days a week. 45 minutes to an hour-stationary bike.    GRANDDAUGHTER (MS. 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       Married for 37 years in 2015, married previously for 14 years. Daughter with first wife and 3 grandkids.    Lives alone with wife. Get to see grandkids a lot. New grandchild in middle of 62      Hobbies-yardwork, previously liked to hunt and fish, does some target shooting     Family History:  The patient's family history includes Heart attack in his mother; Heart disease in his father; Hypertension in his brother; Obesity in his brother; Stomach cancer in his maternal aunt and paternal grandmother.   ROS:   Please see the history of present illness.    Review of Systems  Constitution: Negative.  HENT: Negative.   Cardiovascular: Negative.   Respiratory: Negative.   Endocrine: Negative.   Hematologic/Lymphatic: Negative.   Musculoskeletal: Negative.   Gastrointestinal: Negative.   Genitourinary: Negative.   Neurological: Negative.    All other systems reviewed and are negative.   PHYSICAL EXAM:   VS:  BP 128/74   Pulse 95   Ht 5' 9"  (1.753 m)   Wt 269 lb (122 kg)   SpO2 95%   BMI 39.72 kg/m   Physical Exam  GEN: Obese, in no acute distress  Neck: no JVD, carotid bruits, or masses Cardiac: Irregularly irregular; distant heart sounds, no murmurs, rubs, or gallops  Respiratory:  clear to auscultation bilaterally, normal work of breathing GI: soft, nontender, nondistended, + BS Ext: +1-2 edema without cyanosis, clubbing, decreased distal pulses  bilaterally Neuro:  Alert and Oriented x 3 Psych: euthymic mood, full affect  Wt Readings from Last 3 Encounters:  05/09/18 269 lb (122 kg)  04/19/18 273 lb 12.8 oz (124.2 kg)  04/09/18 274 lb (124.3 kg)      Studies/Labs Reviewed:   EKG:  EKG is not ordered today.   Recent Labs: 06/13/2017: Hemoglobin 14.3; Platelets 169.0; TSH 0.86 02/07/2018: ALT 8 04/25/2018: BUN 14; Creatinine, Ser 1.09; NT-Pro BNP 1,112; Potassium 4.8; Sodium 141   Lipid Panel    Component Value Date/Time   CHOL 74 (L) 02/07/2018 0859   TRIG 82 02/07/2018 0859   HDL 45 02/07/2018 0859   CHOLHDL 1.6 02/07/2018 0859   CHOLHDL 1.7 10/25/2015 0518   VLDL 16 10/25/2015 0518   LDLCALC 13 02/07/2018 0859   LDLDIRECT 18.0 06/13/2017 1504    Additional studies/ records that were reviewed today include:   2D echo 04/25/2018 Study Conclusions   - Left ventricle: The cavity size was normal. Wall thickness was   increased in a pattern of moderate to severe LVH along the   inferior wall, but apex and anterior/anteroseptal segments were   not thickened. There was mild focal basal hypertrophy of the   septum. Systolic function was severely reduced. The estimated   ejection fraction was in the range of 25% to 30%. Diffuse   hypokinesis of the entire myocardiam with akinetic segments:   Doppler parameters are consistent with a reversible restrictive   pattern, indicative of decreased left ventricular diastolic   compliance and/or increased left atrial pressure (grade 3   diastolic dysfunction). Acoustic contrast opacification revealed   no evidence ofthrombus. - Regional wall motion abnormality: Akinesis of the mid-apical   anterior, mid anteroseptal, apical lateral, and apical   myocardium; hypokinesis of the mid inferolateral myocardium;   moderate hypokinesis of the basal anteroseptal, basal-mid  inferoseptal, mid-apical inferior, and apical septal myocardium. - Aortic valve: Left coronary cusp mobility was  moderately   restricted. Transvalvular velocity was within the normal range.   There was no stenosis. There was no regurgitation. - Mitral valve: Calcified annulus. Mobility was mildly restricted.   There was mild regurgitation. - Left atrium: The atrium was severely dilated. - Right ventricle: The cavity size was moderately dilated. Wall   thickness was normal. - Right atrium: The atrium was moderately to severely dilated. - Atrial septum: No defect or patent foramen ovale was identified. - Pulmonary arteries: Systolic pressure was moderately to severely   increased. PA peak pressure: 48 mm Hg (S). - Pericardium, extracardiac: A small pericardial effusion was   identified.   Impressions:   - When compared to prior study, some interval worsening of LV   systolic and diastolic function, with wall motion as noted above.   Aortic valve calcification with restricted left coronary cusp but   without significant stenosis. Biatrial enlargement. Elevated   right sided pressures. Echo contrast used to exclude LV thrombus   and better assess wall motion.   Cardiac catheterization 1/2017Prox LAD lesion, 20% stenosed. The lesion was previously treated with a drug-eluting stent greater than two years ago.  1st Diag lesion, 100% stenosed.  Ost Cx lesion, 80% stenosed.  Ost 2nd Mrg to 2nd Mrg lesion, 95% stenosed.  Mid RCA lesion, 25% stenosed.  Ost Ramus lesion, 50% stenosed.  There is moderate to severe left ventricular systolic dysfunction.   1. Severe LCx stenosis, involving the ostium of the vessel and ostium of OM1, anatomy unsuitable for PCI 2. Continued patency of the stented segment in the LAD and total occlusion of the first diagonal with collateral flow from the RCA 3. Widely patent, dominant RCA 4. Moderately severe segmental LV dysfunction   Recommend: medical therapy for CHF/CAD. Aggressive risk reduction. Resume pradaxa tonight, anticipate DC home tomorrow.      ASSESSMENT:    1. Coronary artery disease involving native coronary artery of native heart without angina pectoris   2. Chronic combined systolic and diastolic heart failure (HCC)   3. Persistent atrial fibrillation ([place])   4. Essential hypertension      PLAN:  In order of problems listed above:  CAD DES to the LAD in 2004, anterior STEMI 2008 secondary to late stent thrombosis and IVIS demonstrated under deployed stent that was dilated with good result.  Last cath 2017 circumflex stenosis unsuitable for PCI, continued patency of the LAD stent and total occlusion of the first diagonal with collaterals from RCA, widely patent RCA, moderate severe LV dysfunction.  No angina  Chronic combined systolic and diastolic CHF with worsening in LV and diastolic function EF now 25 to 30% on echo 04/27/2018 down from 40 to 45% in 2017 and grade 3 DD.  Heart failure much improved and weight down 5 pounds.  Does eat out every meal.  2 g sodium diet discussed in detail.  Will stop myocarditis and begin Entresto 49/51 mg twice daily starting Friday.  Refer to Dr. Haroldine Laws at the heart failure clinic as requested by granddaughter.  Follow-up with [name] to further discuss cardiac catheterization.  Check be met and BNP today  Persistent atrial fibrillation on Pradaxa  Essential hypertension blood pressure well controlled  Medication Adjustments/Labs and Tests Ordered: Current medicines are reviewed at length with the patient today.  Concerns regarding medicines are outlined above.  Medication changes, Labs and Tests ordered today are listed  in the Patient Instructions below. Patient Instructions  Medication Instructions: START: Entresto 49-51 mg twice a day ( Do not start taking until Friday morning )  STOP TAKING YOUR MICARDIS TODAY    Labwork: TODAY: BMET, BNP  Procedures/Testing: None Ordered  Follow-Up: Your physician recommends that you schedule a follow-up appointment with Ermalinda Barrios PA-C in 2 weeks  Your physician has referred you to see Dr. Haroldine Laws at the [facility]   Any Additional Special Instructions Will Be Listed Below (If Applicable).     If you need a refill on your cardiac medications before your next appointment, please call your pharmacy.      Sumner Boast, PA-C  05/09/2018 11:41 AM    [facility] [address], [place], [place]  [address] Phone: [phone]; Fax: [phone]

## 2018-05-09 ENCOUNTER — Encounter: Payer: Self-pay | Admitting: Physician Assistant

## 2018-05-09 ENCOUNTER — Ambulatory Visit (INDEPENDENT_AMBULATORY_CARE_PROVIDER_SITE_OTHER): Payer: Medicare Other | Admitting: Physician Assistant

## 2018-05-09 VITALS — BP 128/74 | HR 95 | Ht 69.0 in | Wt 269.0 lb

## 2018-05-09 DIAGNOSIS — I481 Persistent atrial fibrillation: Secondary | ICD-10-CM

## 2018-05-09 DIAGNOSIS — I5042 Chronic combined systolic (congestive) and diastolic (congestive) heart failure: Secondary | ICD-10-CM

## 2018-05-09 DIAGNOSIS — I2510 Atherosclerotic heart disease of native coronary artery without angina pectoris: Secondary | ICD-10-CM

## 2018-05-09 DIAGNOSIS — I10 Essential (primary) hypertension: Secondary | ICD-10-CM

## 2018-05-09 DIAGNOSIS — I4819 Other persistent atrial fibrillation: Secondary | ICD-10-CM

## 2018-05-09 MED ORDER — SACUBITRIL-VALSARTAN 49-51 MG PO TABS
1.0000 | ORAL_TABLET | Freq: Two times a day (BID) | ORAL | Status: DC
Start: 2018-05-11 — End: 2018-05-09

## 2018-05-09 MED ORDER — SACUBITRIL-VALSARTAN 49-51 MG PO TABS: 1.0000 | ORAL_TABLET | Freq: Two times a day (BID) | ORAL | 3 refills | Status: DC

## 2018-05-09 NOTE — Patient Instructions (Addendum)
Medication Instructions: START: Entresto 49-51 mg twice a day ( Do not start taking until Friday morning )  STOP TAKING YOUR MICARDIS TODAY    Labwork: TODAY: BMET, BNP  Procedures/Testing: None Ordered  Follow-Up: Your physician recommends that you schedule a follow-up appointment with Dr.Cooper at his first available  Follow up with Ermalinda Barrios PA-C in 2 weeks   Your physician has referred you to see Dr. Haroldine Laws at the [facility]   Any Additional Special Instructions Will Be Listed Below (If Applicable).  Two Gram Sodium Diet 2000 mg  What is Sodium? Sodium is a mineral found naturally in many foods. The most significant source of sodium in the diet is table salt, which is about 40% sodium.  Processed, convenience, and preserved foods also contain a large amount of sodium.  The body needs only 500 mg of sodium daily to function,  A normal diet provides more than enough sodium even if you do not use salt.  Why Limit Sodium? A build up of sodium in the body can cause thirst, increased blood pressure, shortness of breath, and water retention.  Decreasing sodium in the diet can reduce edema and risk of heart attack or stroke associated with high blood pressure.  Keep in mind that there are many other factors involved in these health problems.  Heredity, obesity, lack of exercise, cigarette smoking, stress and what you eat all play a role.  General Guidelines:  Do not add salt at the table or in cooking.  One teaspoon of salt contains over 2 grams of sodium.  Read food labels  Avoid processed and convenience foods  Ask your dietitian before eating any foods not dicussed in the menu planning guidelines  Consult your physician if you wish to use a salt substitute or a sodium containing medication such as antacids.  Limit milk and milk products to 16 oz (2 cups) per day.  Shopping Hints:  READ LABELS!! "Dietetic" does not necessarily mean low sodium.  Salt and  other sodium ingredients are often added to foods during processing.   Menu Planning Guidelines Food Group Choose More Often Avoid  Beverages (see also the milk group All fruit juices, low-sodium, salt-free vegetables juices, low-sodium carbonated beverages Regular vegetable or tomato juices, commercially softened water used for drinking or cooking  Breads and Cereals Enriched white, wheat, rye and pumpernickel bread, hard rolls and dinner rolls; muffins, cornbread and waffles; most dry cereals, cooked cereal without added salt; unsalted crackers and breadsticks; low sodium or homemade bread crumbs Bread, rolls and crackers with salted tops; quick breads; instant hot cereals; pancakes; commercial bread stuffing; self-rising flower and biscuit mixes; regular bread crumbs or cracker crumbs  Desserts and Sweets Desserts and sweets mad with mild should be within allowance Instant pudding mixes and cake mixes  Fats Butter or margarine; vegetable oils; unsalted salad dressings, regular salad dressings limited to 1 Tbs; light, sour and heavy cream Regular salad dressings containing bacon fat, bacon bits, and salt pork; snack dips made with instant soup mixes or processed cheese; salted nuts  Fruits Most fresh, frozen and canned fruits Fruits processed with salt or sodium-containing ingredient (some dried fruits are processed with sodium sulfites        Vegetables Fresh, frozen vegetables and low- sodium canned vegetables Regular canned vegetables, sauerkraut, pickled vegetables, and others prepared in brine; frozen vegetables in sauces; vegetables seasoned with ham, bacon or salt pork  Condiments, Sauces, Miscellaneous  Salt substitute with physician's approval; pepper, herbs,  spices; vinegar, lemon or lime juice; hot pepper sauce; garlic powder, onion powder, low sodium soy sauce (1 Tbs.); low sodium condiments (ketchup, chili sauce, mustard) in limited amounts (1 tsp.) fresh ground horseradish; unsalted  tortilla chips, pretzels, potato chips, popcorn, salsa (1/4 cup) Any seasoning made with salt including garlic salt, celery salt, onion salt, and seasoned salt; sea salt, rock salt, kosher salt; meat tenderizers; monosodium glutamate; mustard, regular soy sauce, barbecue, sauce, chili sauce, teriyaki sauce, steak sauce, Worcestershire sauce, and most flavored vinegars; canned gravy and mixes; regular condiments; salted snack foods, olives, picles, relish, horseradish sauce, catsup   Food preparation: Try these seasonings Meats:    Pork Sage, onion Serve with applesauce  Chicken Poultry seasoning, thyme, parsley Serve with cranberry sauce  Lamb Curry powder, rosemary, garlic, thyme Serve with mint sauce or jelly  Veal Marjoram, basil Serve with current jelly, cranberry sauce  Beef Pepper, bay leaf Serve with dry mustard, unsalted chive butter  Fish [place] leaf, dill Serve with unsalted lemon butter, unsalted parsley butter  Vegetables:    Asparagus Lemon juice   Broccoli Lemon juice   Carrots Mustard dressing parsley, mint, nutmeg, glazed with unsalted butter and sugar   Green beans Marjoram, lemon juice, nutmeg,dill seed   Tomatoes Basil, marjoram, onion   Spice /blend for [employer]" 4 tsp ground thyme 1 tsp ground sage 3 tsp ground rosemary 4 tsp ground marjoram   Test your knowledge 1. A product that says "Salt Free" may still contain sodium. True or False 2. Garlic Powder and Hot Pepper Sauce an be used as alternative seasonings.True or False 3. Processed foods have more sodium than fresh foods.  True or False 4. Canned Vegetables have less sodium than froze True or False  WAYS TO DECREASE YOUR SODIUM INTAKE 1. Avoid the use of added salt in cooking and at the table.  Table salt (and other prepared seasonings which contain salt) is probably one of the greatest sources of sodium in the diet.  Unsalted foods can gain flavor from the sweet, sour, and butter taste sensations of herbs and  spices.  Instead of using salt for seasoning, try the following seasonings with the foods listed.  Remember: how you use them to enhance natural food flavors is limited only by your creativity... Allspice-Meat, fish, eggs, fruit, peas, red and yellow vegetables Almond Extract-Fruit baked goods Anise Seed-Sweet breads, fruit, carrots, beets, cottage cheese, cookies (tastes like licorice) Basil-Meat, fish, eggs, vegetables, rice, vegetables salads, soups, sauces Bay Leaf-Meat, fish, stews, poultry Burnet-Salad, vegetables (cucumber-like flavor) Caraway Seed-Bread, cookies, cottage cheese, meat, vegetables, cheese, rice Cardamon-Baked goods, fruit, soups Celery Powder or seed-Salads, salad dressings, sauces, meatloaf, soup, bread.Do not use  celery salt Chervil-Meats, salads, fish, eggs, vegetables, cottage cheese (parsley-like flavor) Chili Power-Meatloaf, chicken cheese, corn, eggplant, egg dishes Chives-Salads cottage cheese, egg dishes, soups, vegetables, sauces Cilantro-Salsa, casseroles Cinnamon-Baked goods, fruit, pork, lamb, chicken, carrots Cloves-Fruit, baked goods, fish, pot roast, green beans, beets, carrots Coriander-Pastry, cookies, meat, salads, cheese (lemon-orange flavor) Cumin-Meatloaf, fish,cheese, eggs, cabbage,fruit pie (caraway flavor) [employer], fruit, eggs, fish, poultry, cottage cheese, vegetables Dill Seed-Meat, cottage cheese, poultry, vegetables, fish, salads, bread Fennel Seed-Bread, cookies, apples, pork, eggs, fish, beets, cabbage, cheese, Licorice-like flavor Garlic-(buds or powder) Salads, meat, poultry, fish, bread, butter, vegetables, potatoes.Do not  use garlic salt Ginger-Fruit, vegetables, baked goods, meat, fish, poultry Horseradish Root-Meet, vegetables, butter Lemon Juice or Extract-Vegetables, fruit, tea, baked goods, fish salads Mace-Baked goods fruit, vegetables, fish, poultry (taste like nutmeg) Maple  Extract-Syrups Marjoram-Meat, chicken,  fish, vegetables, breads, green salads (taste like Sage) Mint-Tea, lamb, sherbet, vegetables, desserts, carrots, cabbage Mustard, Dry or Seed-Cheese, eggs, meats, vegetables, poultry Nutmeg-Baked goods, fruit, chicken, eggs, vegetables, desserts Onion Powder-Meat, fish, poultry, vegetables, cheese, eggs, bread, rice salads (Do not use   Onion salt) Orange Extract-Desserts, baked goods Oregano-Pasta, eggs, cheese, onions, pork, lamb, fish, chicken, vegetables, green salads Paprika-Meat, fish, poultry, eggs, cheese, vegetables Parsley Flakes-Butter, vegetables, meat fish, poultry, eggs, bread, salads (certain forms may   Contain sodium Pepper-Meat fish, poultry, vegetables, eggs Peppermint Extract-Desserts, baked goods Poppy Seed-Eggs, bread, cheese, fruit dressings, baked goods, noodles, vegetables, cottage  [employer], poultry, meat, fish, cauliflower, turnips,eggs bread Saffron-Rice, bread, veal, chicken, fish, eggs Sage-Meat, fish, poultry, onions, eggplant, tomateos, pork, stews Savory-Eggs, salads, poultry, meat, rice, vegetables, soups, pork Tarragon-Meat, poultry, fish, eggs, butter, vegetables (licorice-like flavor)  Thyme-Meat, poultry, fish, eggs, vegetables, (clover-like flavor), sauces, soups Tumeric-Salads, butter, eggs, fish, rice, vegetables (saffron-like flavor) Vanilla Extract-Baked goods, candy Vinegar-Salads, vegetables, meat marinades Walnut Extract-baked goods, candy  2. Choose your Foods Wisely   The following is a list of foods to avoid which are high in sodium:  Meats-Avoid all smoked, canned, salt cured, dried and kosher meat and fish as well as Anchovies   [employer] meats:Bologna, Liverwurst, Pastrami Canned meat or fish  Marinated herring Caviar    Pepperoni Corned Beef   Pizza Dried chipped beef  Salami Frozen breaded fish or meat Salt pork Frankfurters or hot dogs  Sardines Gefilte  fish   Sausage Ham (boiled ham, Proscuitto Smoked butt    spiced ham)   Spam      TV Dinners Vegetables Canned vegetables (Regular) Relish Canned mushrooms  Sauerkraut Olives    Tomato juice Pickles  Bakery and Dessert Products Canned puddings  Cream pies Cheesecake   Decorated cakes Cookies  Beverages/Juices Tomato juice, regular  Gatorade   V-8 vegetable juice, regular  Breads and Cereals Biscuit mixes   Salted potato chips, corn chips, pretzels Bread stuffing mixes  Salted crackers and rolls Pancake and waffle mixes Self-rising flour  Seasonings Accent    Meat sauces Barbecue sauce  Meat tenderizer Catsup    Monosodium glutamate (MSG) Celery salt   Onion salt Chili sauce   Prepared mustard Garlic salt   Salt, seasoned salt, sea salt Gravy mixes   Soy sauce Horseradish   Steak sauce Ketchup   Tartar sauce Lite salt    Teriyaki sauce Marinade mixes   Worcestershire sauce  Others Baking powder   Cocoa and cocoa mixes Baking soda   Commercial casserole mixes Candy-caramels, chocolate  Dehydrated soups    Bars, fudge,nougats  Instant rice and pasta mixes Canned broth or soup  Maraschino cherries Cheese, aged and processed cheese and cheese spreads  Learning Assessment Quiz  Indicated T (for True) or F (for False) for each of the following statements:  1. _____ Fresh fruits and vegetables and unprocessed grains are generally low in sodium 2. _____ Water may contain a considerable amount of sodium, depending on the source 3. _____ You can always tell if a food is high in sodium by tasting it 4. _____ Certain laxatives my be high in sodium and should be avoided unless prescribed   by a physician or pharmacist 5. _____ Salt substitutes may be used freely by anyone on a sodium restricted diet 6. _____ Sodium is present in table salt, food additives and as a natural component of  most foods 7. _____ Table salt is approximately 90% sodium 8. _____ Limiting sodium  intake may help prevent excess fluid accumulation in the body 9. _____ On a sodium-restricted diet, seasonings such as bouillon soy sauce, and    cooking wine should be used in place of table salt 10. _____ On an ingredient list, a product which lists monosodium glutamate as the first   ingredient is an appropriate food to include on a low sodium diet  Circle the best answer(s) to the following statements (Hint: there may be more than one correct answer)  11. On a low-sodium diet, some acceptable snack items are:    A. Olives  F. Bean dip   K. Grapefruit juice    B. Salted Pretzels G. Commercial Popcorn   L. Canned peaches    C. Carrot Sticks  H. Bouillon   M. Unsalted nuts   D. [place] fries  I. Peanut butter crackers N. Salami   E. Sweet pickles J. Tomato Juice   O. Pizza  12.  Seasonings that may be used freely on a reduced - sodium diet include   A. Lemon wedges F.Monosodium glutamate K. Celery seed    B.Soysauce   G. Pepper   L. Mustard powder   C. Sea salt  H. Cooking wine  M. Onion flakes   D. Vinegar  E. Prepared horseradish N. Salsa   E. Sage   J. Worcestershire sauce  O. Chutney    If you need a refill on your cardiac medications before your next appointment, please call your pharmacy.

## 2018-05-09 NOTE — Addendum Note (Signed)
Addended by: Mendel Ryder on: 05/09/2018 04:03 PM   Modules accepted: Orders

## 2018-05-10 ENCOUNTER — Telehealth: Payer: Self-pay

## 2018-05-10 ENCOUNTER — Ambulatory Visit (HOSPITAL_COMMUNITY)
Admission: RE | Admit: 2018-05-10 | Discharge: 2018-05-10 | Disposition: A | Discharge status: Home / Self Care | Payer: Medicare Other | Source: Ambulatory Visit | Attending: Internal Medicine | Admitting: Internal Medicine

## 2018-05-10 VITALS — BP 143/82 | HR 77 | Wt 269.6 lb

## 2018-05-10 DIAGNOSIS — R0602 Shortness of breath: Secondary | ICD-10-CM | POA: Insufficient documentation

## 2018-05-10 DIAGNOSIS — Z79899 Other long term (current) drug therapy: Secondary | ICD-10-CM | POA: Diagnosis not present

## 2018-05-10 DIAGNOSIS — I110 Hypertensive heart disease with heart failure: Secondary | ICD-10-CM | POA: Diagnosis not present

## 2018-05-10 DIAGNOSIS — Z7984 Long term (current) use of oral hypoglycemic drugs: Secondary | ICD-10-CM | POA: Diagnosis not present

## 2018-05-10 DIAGNOSIS — G4733 Obstructive sleep apnea (adult) (pediatric): Secondary | ICD-10-CM | POA: Diagnosis not present

## 2018-05-10 DIAGNOSIS — M549 Dorsalgia, unspecified: Secondary | ICD-10-CM | POA: Insufficient documentation

## 2018-05-10 DIAGNOSIS — I252 Old myocardial infarction: Secondary | ICD-10-CM | POA: Insufficient documentation

## 2018-05-10 DIAGNOSIS — E039 Hypothyroidism, unspecified: Secondary | ICD-10-CM | POA: Diagnosis not present

## 2018-05-10 DIAGNOSIS — Z7901 Long term (current) use of anticoagulants: Secondary | ICD-10-CM | POA: Diagnosis not present

## 2018-05-10 DIAGNOSIS — Z9989 Dependence on other enabling machines and devices: Secondary | ICD-10-CM | POA: Diagnosis not present

## 2018-05-10 DIAGNOSIS — E785 Hyperlipidemia, unspecified: Secondary | ICD-10-CM | POA: Insufficient documentation

## 2018-05-10 DIAGNOSIS — I2510 Atherosclerotic heart disease of native coronary artery without angina pectoris: Secondary | ICD-10-CM | POA: Diagnosis not present

## 2018-05-10 DIAGNOSIS — I482 Chronic atrial fibrillation, unspecified: Secondary | ICD-10-CM

## 2018-05-10 DIAGNOSIS — I255 Ischemic cardiomyopathy: Secondary | ICD-10-CM | POA: Insufficient documentation

## 2018-05-10 DIAGNOSIS — I5042 Chronic combined systolic (congestive) and diastolic (congestive) heart failure: Secondary | ICD-10-CM | POA: Diagnosis not present

## 2018-05-10 DIAGNOSIS — Z955 Presence of coronary angioplasty implant and graft: Secondary | ICD-10-CM | POA: Insufficient documentation

## 2018-05-10 DIAGNOSIS — Z79890 Hormone replacement therapy (postmenopausal): Secondary | ICD-10-CM | POA: Insufficient documentation

## 2018-05-10 DIAGNOSIS — Z6839 Body mass index (BMI) 39.0-39.9, adult: Secondary | ICD-10-CM | POA: Insufficient documentation

## 2018-05-10 DIAGNOSIS — Z8673 Personal history of transient ischemic attack (TIA), and cerebral infarction without residual deficits: Secondary | ICD-10-CM | POA: Insufficient documentation

## 2018-05-10 DIAGNOSIS — I5022 Chronic systolic (congestive) heart failure: Secondary | ICD-10-CM

## 2018-05-10 DIAGNOSIS — Z87891 Personal history of nicotine dependence: Secondary | ICD-10-CM | POA: Insufficient documentation

## 2018-05-10 DIAGNOSIS — Z8249 Family history of ischemic heart disease and other diseases of the circulatory system: Secondary | ICD-10-CM | POA: Insufficient documentation

## 2018-05-10 DIAGNOSIS — Z7902 Long term (current) use of antithrombotics/antiplatelets: Secondary | ICD-10-CM | POA: Insufficient documentation

## 2018-05-10 DIAGNOSIS — I2582 Chronic total occlusion of coronary artery: Secondary | ICD-10-CM | POA: Insufficient documentation

## 2018-05-10 DIAGNOSIS — E538 Deficiency of other specified B group vitamins: Secondary | ICD-10-CM | POA: Insufficient documentation

## 2018-05-10 DIAGNOSIS — Z800 Family history of malignant neoplasm of digestive organs: Secondary | ICD-10-CM | POA: Insufficient documentation

## 2018-05-10 DIAGNOSIS — E119 Type 2 diabetes mellitus without complications: Secondary | ICD-10-CM | POA: Diagnosis not present

## 2018-05-10 LAB — BASIC METABOLIC PANEL
BUN/Creatinine Ratio: 14 (ref 10–24)
BUN: 16 mg/dL (ref 8–27)
CALCIUM: 9.2 mg/dL (ref 8.6–10.2)
CHLORIDE: 100 mmol/L (ref 96–106)
CO2: 25 mmol/L (ref 20–29)
Creatinine, Ser: 1.12 mg/dL (ref 0.76–1.27)
GFR calc Af Amer: 75 mL/min/{1.73_m2} (ref >59)
GFR calc non Af Amer: 65 mL/min/{1.73_m2} (ref >59)
GLUCOSE: 128 mg/dL — AB (ref 65–99)
POTASSIUM: 4.9 mmol/L (ref 3.5–5.2)
Sodium: 142 mmol/L (ref 134–144)

## 2018-05-10 LAB — PRO B NATRIURETIC PEPTIDE: NT-PRO BNP: 764 pg/mL — AB (ref 0–376)

## 2018-05-10 MED ORDER — FUROSEMIDE 40 MG PO TABS: 40.0000 mg | ORAL_TABLET | Freq: Every day | ORAL | 11 refills | Status: DC

## 2018-05-10 NOTE — Patient Instructions (Addendum)
EKG today.  STOP Potassium.  DECREASE Lasix to 40 mg once daily every morning. Can take extra 40 mg in the afternoon AS NEEDED for weight gain 3 lbs in 24 hrs or swelling.  Right and Left heart cath with [name] in 2 weeks. Will have his office contact you directly to schedule.  Follow up 2 months with Dr. Haroldine Laws.  _______________________________________________________________ [name] Code: 7425  Take all medication as prescribed the day of your appointment. Bring all medications with you to your appointment.  Do the following things EVERYDAY: 1) Weigh yourself in the morning before breakfast. Write it down and keep it in a log. 2) Take your medicines as prescribed 3) Eat low salt foods-Limit salt (sodium) to 2000 mg per day.  4) Stay as active as you can everyday 5) Limit all fluids for the day to less than 2 liters

## 2018-05-10 NOTE — H&P (View-Only) (Signed)
Advanced Heart Failure Clinic Note   Referring Physician: [name] PCP: Marin Olp, MD PCP-Cardiologist: Sherren Mocha, MD   HPI:  [name] is a 72 y.o. male with h/o CAD s/p DES to LAD 2004, STEMI 2008 2/2 very late stent thombosis, Chronic Afib, Chronic combined CHF, OSA on CPAP, morbid obesity, HTN, HLD, and Type 2 DM. Referred by Ermalinda Barrios PA for assistance with management of HF.  Saw [name] 04/09/18 with worsening dyspnea. Spiro increased. BNP elevated. Echo repeated which showed worsening LVEF as below. Lasix increased as well. Saw [name] on 05/09/18 and ARB switched to [facility],   He presents today to establish in the CHF clinic for med titration and further evaluation/treatment. His wife and granddaughter (nurse at [facility]) are present with him. Overall doing okay. He is feeling much better after doubling his lasix. Down 6 lbs since the increase. He was having CP and SOB on exertion prior to increasing lasix. No longer having any CP. He is SOB with hills and steps. Has to take breaks while walking. Activity also limited by back pain. He goes to cardiac rehab 2x/week. He still has mild BLE edema. Sleeps on a wedge. Wears CPAP qHS. No dizziness. Denies bleeding on pradaxa. Denies cough, fever, or chills. Was previously missing medications, but has improved since he wasn't feeling well. Eats whatever he wants, but does not add salt. Eats out at least twice/day, usually at [facility]. Drinks ~2L daily. Does not weigh daily at home, but gets weighed at cardiac rehab in [place] weekly (265-268 lbs). Plans to start [facility] tomorrow.    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 Former smoker (quit 1967). No drug use. Lives with wife.   LHC 2017 - Patent stent in LAD, severe ostial stenosis of LCx not amenable to PCI, patent, non-dominant RCA supplying collaterals to the diagonal branch to the LAD.   Echo 09/29/2016 LVEF 40-45%, trivial MR, Mod LAE,  Echo  04/25/2018 LVEF 25-30%, Grade 3 DD, Mild MR, Severe LAE, Mod RV dilation, PA peak pressure 48 mm Hg. WMA noted with Akinesis of the mid-apical anterior, mid anteroseptal, apical lateral, and apical myocardium; hypokinesis of the mid myocardium; moderate hypokinesis of the basal anteroseptal, basal-mid inferoseptal, mid-apical inferior, and apical septal myocardium.  Review of Systems: [y] = yes, [ ]  = no   General: Weight gain [ ] ; Weight loss [ ] ; Anorexia [ ] ; Fatigue [ ] ; Fever [ ] ; Chills [ ] ; Weakness [ ]   Cardiac: Chest pain/pressure Blue.Reese ]; Resting SOB [ ] ; Exertional SOB Blue.Reese ]; Orthopnea [ ] ; Pedal Edema [ ] ; Palpitations [ ] ; Syncope [ ] ; Presyncope [ ] ; Paroxysmal nocturnal dyspnea[ ]   Pulmonary: Cough [ ] ; Wheezing[ ] ; Hemoptysis[ ] ; Sputum [ ] ; Snoring [ ]   GI: Vomiting[ ] ; Dysphagia[ ] ; Melena[ ] ; Hematochezia [ ] ; Heartburn[ ] ; Abdominal pain [ ] ; Constipation [ ] ; Diarrhea [ ] ; BRBPR [ ]   GU: Hematuria[ ] ; Dysuria [ ] ; Nocturia[ ]   Vascular: Pain in legs with walking [ ] ; Pain in feet with lying flat [ ] ; Non-healing sores [ ] ; Stroke [ ] ; TIA [ ] ; Slurred speech [ ] ;  Neuro: Headaches[ ] ; Vertigo[ ] ; Seizures[ ] ; Paresthesias[ ] ;Blurred vision [ ] ; Diplopia [ ] ; Vision changes [ ]   Ortho/Skin: Arthritis [ ] ; Joint pain [ ] ; Muscle pain [ ] ; Joint swelling [ ] ; Back Pain [ y]; Rash [ ]   Psych: Depression[ ] ; Anxiety[ ]   Heme: Bleeding problems [ ] ;  Clotting disorders [ ] ; Anemia [ ]   Endocrine: Diabetes [ y]; Thyroid dysfunction[ ]    Past Medical History:  Diagnosis Date  . Atrial fibrillation ([place])    initial diagnoses 2012  . B12 deficiency    per patient previously taking shots  . CORONARY ARTERY DISEASE 04/12/2007   2 stents last in 2006.   Marland Kitchen DIABETES MELLITUS, TYPE II 04/16/2007  . Diverticulosis of colon (without mention of hemorrhage)   . HYPERLIPIDEMIA 04/16/2007   reveal study. not sure of medication  . HYPERTENSION 04/12/2007  . HYPOTHYROIDISM 04/12/2007  . Ischemic  cardiomyopathy    cath 35-40%  . MYOCARDIAL INFARCTION, HX OF 04/12/2007  . OBESITY 06/16/2009  . SLEEP APNEA 04/12/2007   CPAP  . Stroke ([place])   . ULCERATIVE COLITIS, LEFT SIDED 11/23/2010    Current Outpatient Medications  Medication Sig Dispense Refill  . atorvastatin (LIPITOR) 20 MG tablet Take 1 tablet (20 mg total) by mouth daily. 90 tablet 3  . carvedilol (COREG) 25 MG tablet TAKE ONE TABLET TWICE DAILY WITH A MEAL 60 tablet 10  . clopidogrel (PLAVIX) 75 MG tablet TAKE ONE (1) TABLET EACH DAY 90 tablet 1  . fish oil-omega-3 fatty acids 1000 MG capsule Take 2 g by mouth daily.     . furosemide (LASIX) 40 MG tablet Take 1 tablet (40 mg total) by mouth 2 (two) times daily. 60 tablet 11  . glipiZIDE (GLUCOTROL) 10 MG tablet TAKE ONE TABLET TWICE DAILY 180 tablet 2  . glucosamine-chondroitin 500-400 MG tablet Take 1 tablet by mouth daily as needed (knee flare up or pain).    Marland Kitchen glucose blood test strip Use as instructed to test 1 time daily 100 each 12  . isosorbide mononitrate (IMDUR) 60 MG 24 hr tablet TAKE ONE (1) TABLET BY MOUTH EVERY DAY 90 tablet 3  . JARDIANCE 10 MG TABS tablet TAKE ONE TABLET BY MOUTH EVERY OTHER DAY 30 tablet 3  . mesalamine (APRISO) 0.375 g 24 hr capsule Take 4 capsules (1.5 g total) by mouth daily. 120 capsule 11  . metFORMIN (GLUCOPHAGE) 1000 MG tablet TAKE ONE TABLET TWICE DAILY WITH A MEAL 180 tablet 1  . potassium chloride SA (K-DUR,KLOR-CON) 20 MEQ tablet TAKE ONE (1) TABLET BY MOUTH EVERY DAY 90 tablet 1  . PRADAXA 150 MG CAPS capsule TAKE ONE (1) CAPSULE BY MOUTH TWICE A DAY. (EVERY 12 HOURS.) 60 capsule 8  . [START ON 05/11/2018] sacubitril-valsartan (ENTRESTO) 49-51 MG Take 1 tablet by mouth 2 (two) times daily. 180 tablet 3  . sitaGLIPtin (JANUVIA) 100 MG tablet TAKE ONE (1) TABLET EACH DAY 90 tablet 2  . spironolactone (ALDACTONE) 25 MG tablet Take 1 tablet (25 mg total) by mouth daily. 90 tablet 3  . SYNTHROID 50 MCG tablet Take 1 tablet (50 mcg total) by  mouth daily before breakfast. 90 tablet 3   No current facility-administered medications for this encounter.     No Known Allergies    Social History   Socioeconomic History  . Marital status: Married    Spouse name: Not on file  . Number of children: 1  . Years of education: Not on file  . Highest education level: Not on file  Occupational History  . Occupation: RETIRED    Employer: RETIRED  Social Needs  . Financial resource strain: Not on file  . Food insecurity:    Worry: Not on file    Inability: Not on file  . Transportation needs:  Medical: Not on file    Non-medical: Not on file  Tobacco Use  . Smoking status: Former Smoker    Packs/day: 1.00    Years: 5.00    Pack years: 5.00    Types: Cigarettes    Last attempt to quit: 04/13/1966    Years since quitting: 52.1  . Smokeless tobacco: Never Used  Substance and Sexual Activity  . Alcohol use: No    Alcohol/week: 0.0 oz    Comment: no more beer  . Drug use: No  . Sexual activity: Not on file  Lifestyle  . Physical activity:    Days per week: Not on file    Minutes per session: Not on file  . Stress: Not on file  Relationships  . Social connections:    Talks on phone: Not on file    Gets together: Not on file    Attends religious service: Not on file    Active member of club or organization: Not on file    Attends meetings of clubs or organizations: Not on file    Relationship status: Not on file  . Intimate partner violence:    Fear of current or ex partner: Not on file    Emotionally abused: Not on file    Physically abused: Not on file    Forced sexual activity: Not on file  Other Topics Concern  . Not on file  Social History Narrative   Cardiorehab 3 days a week. 45 minutes to an hour-stationary bike.    GRANDDAUGHTER (MS. 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       Married for 37 years in  2015, married previously for 14 years. Daughter with first wife and 3 grandkids.    Lives alone with wife. Get to see grandkids a lot. New grandchild in middle of 74      Hobbies-yardwork, previously liked to hunt and fish, does some target shooting      Family History  Problem Relation Age of Onset  . Heart attack Mother        mid 69s  . Heart disease Father        H/O CAD, CABG, VALVE SURGERY  . Hypertension Brother   . Obesity Brother   . Stomach cancer Maternal Aunt   . Stomach cancer Paternal Grandmother        ? colon     Vitals:   05/10/18 1140  BP: (!) 143/82  Pulse: 77  SpO2: 98%  Weight: 269 lb 9.6 oz (122.3 kg)    Wt Readings from Last 3 Encounters:  05/10/18 269 lb 9.6 oz (122.3 kg)  05/09/18 269 lb (122 kg)  04/19/18 273 lb 12.8 oz (124.2 kg)     PHYSICAL EXAM: General:  Obese. No respiratory difficulty HEENT: normal anicteric Neck: supple. Thick neck. no JVD. Carotids 2+ bilat; no bruits. No lymphadenopathy or thyromegaly appreciated. Cor: PMI nondisplaced. Irregular rate & rhythm. No rubs, gallops or murmurs. Lungs: clear no wheeze Abdomen: obese, soft, nontender, nondistended. No hepatosplenomegaly. No bruits or masses. Good bowel sounds. Extremities: no cyanosis, clubbing, rash, 1+ edema Neuro: alert & oriented x 3, cranial nerves grossly intact. moves all 4 extremities w/o difficulty. Affect pleasant   ECG AF 91 with iVCD. Nonspecific ST-T abnormalities. Personally reviewed    ASSESSMENT & PLAN:  1. Chronic combined CHF, ICM - Echo  04/25/2018 LVEF 25-30%, Grade 3 DD, Mild MR, Severe LAE, Mod RV dilation, PA peak pressure 48 mm Hg. WMA noted - NYHA III (improved with diuresis) - Volume status stable on exam - With starting Entresto, decrease lasix to 40 mg daily with additional 40 mg PRN.  - Stop Telmisartan. Start Entresto 49/51 mg BID (already ordered by [facility]).  - Continue coreg 25 mg BID - Continue spironolactone 25 mg daily (recent  increase). K 4.9 7/31. Taking 20 meq K daily. Stop supp. - Pt is on Jardiance.  - Repeat R/LHC with Dr Burt Knack in 2 weeks. Dr Haroldine Laws discussed with Dr Burt Knack.  2. CAD - LHC 2017 with circumflex stenosis unsuitable for PCI, continued patency of the LAD stent and total occlusion of the first diagonal with collaterals from RCA, widely patent RCA - No s/s of ischemia.   - With worsening EF, suspect will need repeat LHC.  - Continue statin and plavix. Not on ASA with Pradaxa.  - Repeat R/LHC with Dr Burt Knack in 2 weeks. He will need to hold his pradaxa for 48 hours prior.  3. Chronic Atrial Fibrillation - Chronic. Rate controlled.  - On Pradaxa for AC. Denies bleeding.  - This patients CHA2DS2-VASc is at least 5. 4. Essential HTN - Elevated today. Starting [facility] tonight 5. OSA on CPAP - Wears every night. 6. Morbid Obesity - Body mass index is 39.81 kg/m.  - Needs weight loss 7. DM2 - Per PCP - Pt is on jardiance.   R/LHC in 2 weeks with Dr Burt Knack He will start [facility] tonight - already has 30 day free card. Decrease lasix to 40 mg daily with additional 40 mg PRN Stop K supp Follow up with Dr Haroldine Laws in 2 months  Georgiana Shore, NP 05/10/18   Patient seen and examined with the above-signed Advanced Practice Provider and/or Housestaff. I personally reviewed laboratory data, imaging studies and relevant notes. I independently examined the patient and formulated the important aspects of the plan. I have edited the note to reflect any of my changes or salient points. I have personally discussed the plan with the patient and/or family.  72 y/o with ischemic CM now with recent drop in EF and worsening HF symptoms. Symptomatically improved with changes in diuretics. On good HF medicines - switching to [employer] today. No significant volume overload on exam. No s3. Does have chronic AF that is rate controlled. I discussed with [name] and he will need R/L cath to evalaute reason for  drop in EF and recent CP. We have scheduled for 2 weeks. Call sooner if symptoms worsen. We will follow in HF clinic.   Glori Bickers, MD  12:38 PM

## 2018-05-10 NOTE — Progress Notes (Signed)
Advanced Heart Failure Clinic Note   Referring Physician: [name] PCP: Marin Olp, MD PCP-Cardiologist: Sherren Mocha, MD   HPI:  [name] is a 72 y.o. male with h/o CAD s/p DES to LAD 2004, STEMI 2008 2/2 very late stent thombosis, Chronic Afib, Chronic combined CHF, OSA on CPAP, morbid obesity, HTN, HLD, and Type 2 DM. Referred by Ermalinda Barrios PA for assistance with management of HF.  Saw [name] 04/09/18 with worsening dyspnea. Spiro increased. BNP elevated. Echo repeated which showed worsening LVEF as below. Lasix increased as well. Saw [name] on 05/09/18 and ARB switched to [facility],   He presents today to establish in the CHF clinic for med titration and further evaluation/treatment. His wife and granddaughter (nurse at [facility]) are present with him. Overall doing okay. He is feeling much better after doubling his lasix. Down 6 lbs since the increase. He was having CP and SOB on exertion prior to increasing lasix. No longer having any CP. He is SOB with hills and steps. Has to take breaks while walking. Activity also limited by back pain. He goes to cardiac rehab 2x/week. He still has mild BLE edema. Sleeps on a wedge. Wears CPAP qHS. No dizziness. Denies bleeding on pradaxa. Denies cough, fever, or chills. Was previously missing medications, but has improved since he wasn't feeling well. Eats whatever he wants, but does not add salt. Eats out at least twice/day, usually at [facility]. Drinks ~2L daily. Does not weigh daily at home, but gets weighed at cardiac rehab in [place] weekly (265-268 lbs). Plans to start [facility] tomorrow.    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 Former smoker (quit 1967). No drug use. Lives with wife.   LHC 2017 - Patent stent in LAD, severe ostial stenosis of LCx not amenable to PCI, patent, non-dominant RCA supplying collaterals to the diagonal branch to the LAD.   Echo 09/29/2016 LVEF 40-45%, trivial MR, Mod LAE,  Echo  04/25/2018 LVEF 25-30%, Grade 3 DD, Mild MR, Severe LAE, Mod RV dilation, PA peak pressure 48 mm Hg. WMA noted with Akinesis of the mid-apical anterior, mid anteroseptal, apical lateral, and apical myocardium; hypokinesis of the mid myocardium; moderate hypokinesis of the basal anteroseptal, basal-mid inferoseptal, mid-apical inferior, and apical septal myocardium.  Review of Systems: [y] = yes, [ ]  = no   General: Weight gain [ ] ; Weight loss [ ] ; Anorexia [ ] ; Fatigue [ ] ; Fever [ ] ; Chills [ ] ; Weakness [ ]   Cardiac: Chest pain/pressure Blue.Reese ]; Resting SOB [ ] ; Exertional SOB Blue.Reese ]; Orthopnea [ ] ; Pedal Edema [ ] ; Palpitations [ ] ; Syncope [ ] ; Presyncope [ ] ; Paroxysmal nocturnal dyspnea[ ]   Pulmonary: Cough [ ] ; Wheezing[ ] ; Hemoptysis[ ] ; Sputum [ ] ; Snoring [ ]   GI: Vomiting[ ] ; Dysphagia[ ] ; Melena[ ] ; Hematochezia [ ] ; Heartburn[ ] ; Abdominal pain [ ] ; Constipation [ ] ; Diarrhea [ ] ; BRBPR [ ]   GU: Hematuria[ ] ; Dysuria [ ] ; Nocturia[ ]   Vascular: Pain in legs with walking [ ] ; Pain in feet with lying flat [ ] ; Non-healing sores [ ] ; Stroke [ ] ; TIA [ ] ; Slurred speech [ ] ;  Neuro: Headaches[ ] ; Vertigo[ ] ; Seizures[ ] ; Paresthesias[ ] ;Blurred vision [ ] ; Diplopia [ ] ; Vision changes [ ]   Ortho/Skin: Arthritis [ ] ; Joint pain [ ] ; Muscle pain [ ] ; Joint swelling [ ] ; Back Pain [ y]; Rash [ ]   Psych: Depression[ ] ; Anxiety[ ]   Heme: Bleeding problems [ ] ;  Clotting disorders [ ] ; Anemia [ ]   Endocrine: Diabetes [ y]; Thyroid dysfunction[ ]    Past Medical History:  Diagnosis Date  . Atrial fibrillation ([place])    initial diagnoses 2012  . B12 deficiency    per patient previously taking shots  . CORONARY ARTERY DISEASE 04/12/2007   2 stents last in 2006.   Marland Kitchen DIABETES MELLITUS, TYPE II 04/16/2007  . Diverticulosis of colon (without mention of hemorrhage)   . HYPERLIPIDEMIA 04/16/2007   reveal study. not sure of medication  . HYPERTENSION 04/12/2007  . HYPOTHYROIDISM 04/12/2007  . Ischemic  cardiomyopathy    cath 35-40%  . MYOCARDIAL INFARCTION, HX OF 04/12/2007  . OBESITY 06/16/2009  . SLEEP APNEA 04/12/2007   CPAP  . Stroke ([place])   . ULCERATIVE COLITIS, LEFT SIDED 11/23/2010    Current Outpatient Medications  Medication Sig Dispense Refill  . atorvastatin (LIPITOR) 20 MG tablet Take 1 tablet (20 mg total) by mouth daily. 90 tablet 3  . carvedilol (COREG) 25 MG tablet TAKE ONE TABLET TWICE DAILY WITH A MEAL 60 tablet 10  . clopidogrel (PLAVIX) 75 MG tablet TAKE ONE (1) TABLET EACH DAY 90 tablet 1  . fish oil-omega-3 fatty acids 1000 MG capsule Take 2 g by mouth daily.     . furosemide (LASIX) 40 MG tablet Take 1 tablet (40 mg total) by mouth 2 (two) times daily. 60 tablet 11  . glipiZIDE (GLUCOTROL) 10 MG tablet TAKE ONE TABLET TWICE DAILY 180 tablet 2  . glucosamine-chondroitin 500-400 MG tablet Take 1 tablet by mouth daily as needed (knee flare up or pain).    Marland Kitchen glucose blood test strip Use as instructed to test 1 time daily 100 each 12  . isosorbide mononitrate (IMDUR) 60 MG 24 hr tablet TAKE ONE (1) TABLET BY MOUTH EVERY DAY 90 tablet 3  . JARDIANCE 10 MG TABS tablet TAKE ONE TABLET BY MOUTH EVERY OTHER DAY 30 tablet 3  . mesalamine (APRISO) 0.375 g 24 hr capsule Take 4 capsules (1.5 g total) by mouth daily. 120 capsule 11  . metFORMIN (GLUCOPHAGE) 1000 MG tablet TAKE ONE TABLET TWICE DAILY WITH A MEAL 180 tablet 1  . potassium chloride SA (K-DUR,KLOR-CON) 20 MEQ tablet TAKE ONE (1) TABLET BY MOUTH EVERY DAY 90 tablet 1  . PRADAXA 150 MG CAPS capsule TAKE ONE (1) CAPSULE BY MOUTH TWICE A DAY. (EVERY 12 HOURS.) 60 capsule 8  . [START ON 05/11/2018] sacubitril-valsartan (ENTRESTO) 49-51 MG Take 1 tablet by mouth 2 (two) times daily. 180 tablet 3  . sitaGLIPtin (JANUVIA) 100 MG tablet TAKE ONE (1) TABLET EACH DAY 90 tablet 2  . spironolactone (ALDACTONE) 25 MG tablet Take 1 tablet (25 mg total) by mouth daily. 90 tablet 3  . SYNTHROID 50 MCG tablet Take 1 tablet (50 mcg total) by  mouth daily before breakfast. 90 tablet 3   No current facility-administered medications for this encounter.     No Known Allergies    Social History   Socioeconomic History  . Marital status: Married    Spouse name: Not on file  . Number of children: 1  . Years of education: Not on file  . Highest education level: Not on file  Occupational History  . Occupation: RETIRED    Employer: RETIRED  Social Needs  . Financial resource strain: Not on file  . Food insecurity:    Worry: Not on file    Inability: Not on file  . Transportation needs:  Medical: Not on file    Non-medical: Not on file  Tobacco Use  . Smoking status: Former Smoker    Packs/day: 1.00    Years: 5.00    Pack years: 5.00    Types: Cigarettes    Last attempt to quit: 04/13/1966    Years since quitting: 52.1  . Smokeless tobacco: Never Used  Substance and Sexual Activity  . Alcohol use: No    Alcohol/week: 0.0 oz    Comment: no more beer  . Drug use: No  . Sexual activity: Not on file  Lifestyle  . Physical activity:    Days per week: Not on file    Minutes per session: Not on file  . Stress: Not on file  Relationships  . Social connections:    Talks on phone: Not on file    Gets together: Not on file    Attends religious service: Not on file    Active member of club or organization: Not on file    Attends meetings of clubs or organizations: Not on file    Relationship status: Not on file  . Intimate partner violence:    Fear of current or ex partner: Not on file    Emotionally abused: Not on file    Physically abused: Not on file    Forced sexual activity: Not on file  Other Topics Concern  . Not on file  Social History Narrative   Cardiorehab 3 days a week. 45 minutes to an hour-stationary bike.    GRANDDAUGHTER (MS. 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       Married for 37 years in  2015, married previously for 14 years. Daughter with first wife and 3 grandkids.    Lives alone with wife. Get to see grandkids a lot. New grandchild in middle of 77      Hobbies-yardwork, previously liked to hunt and fish, does some target shooting      Family History  Problem Relation Age of Onset  . Heart attack Mother        mid 31s  . Heart disease Father        H/O CAD, CABG, VALVE SURGERY  . Hypertension Brother   . Obesity Brother   . Stomach cancer Maternal Aunt   . Stomach cancer Paternal Grandmother        ? colon     Vitals:   05/10/18 1140  BP: (!) 143/82  Pulse: 77  SpO2: 98%  Weight: 269 lb 9.6 oz (122.3 kg)    Wt Readings from Last 3 Encounters:  05/10/18 269 lb 9.6 oz (122.3 kg)  05/09/18 269 lb (122 kg)  04/19/18 273 lb 12.8 oz (124.2 kg)     PHYSICAL EXAM: General:  Obese. No respiratory difficulty HEENT: normal anicteric Neck: supple. Thick neck. no JVD. Carotids 2+ bilat; no bruits. No lymphadenopathy or thyromegaly appreciated. Cor: PMI nondisplaced. Irregular rate & rhythm. No rubs, gallops or murmurs. Lungs: clear no wheeze Abdomen: obese, soft, nontender, nondistended. No hepatosplenomegaly. No bruits or masses. Good bowel sounds. Extremities: no cyanosis, clubbing, rash, 1+ edema Neuro: alert & oriented x 3, cranial nerves grossly intact. moves all 4 extremities w/o difficulty. Affect pleasant   ECG AF 91 with iVCD. Nonspecific ST-T abnormalities. Personally reviewed    ASSESSMENT & PLAN:  1. Chronic combined CHF, ICM - Echo  04/25/2018 LVEF 25-30%, Grade 3 DD, Mild MR, Severe LAE, Mod RV dilation, PA peak pressure 48 mm Hg. WMA noted - NYHA III (improved with diuresis) - Volume status stable on exam - With starting Entresto, decrease lasix to 40 mg daily with additional 40 mg PRN.  - Stop Telmisartan. Start Entresto 49/51 mg BID (already ordered by [facility]).  - Continue coreg 25 mg BID - Continue spironolactone 25 mg daily (recent  increase). K 4.9 7/31. Taking 20 meq K daily. Stop supp. - Pt is on Jardiance.  - Repeat R/LHC with Dr Burt Knack in 2 weeks. Dr Haroldine Laws discussed with Dr Burt Knack.  2. CAD - LHC 2017 with circumflex stenosis unsuitable for PCI, continued patency of the LAD stent and total occlusion of the first diagonal with collaterals from RCA, widely patent RCA - No s/s of ischemia.   - With worsening EF, suspect will need repeat LHC.  - Continue statin and plavix. Not on ASA with Pradaxa.  - Repeat R/LHC with Dr Burt Knack in 2 weeks. He will need to hold his pradaxa for 48 hours prior.  3. Chronic Atrial Fibrillation - Chronic. Rate controlled.  - On Pradaxa for AC. Denies bleeding.  - This patients CHA2DS2-VASc is at least 5. 4. Essential HTN - Elevated today. Starting [facility] tonight 5. OSA on CPAP - Wears every night. 6. Morbid Obesity - Body mass index is 39.81 kg/m.  - Needs weight loss 7. DM2 - Per PCP - Pt is on jardiance.   R/LHC in 2 weeks with Dr Burt Knack He will start [facility] tonight - already has 30 day free card. Decrease lasix to 40 mg daily with additional 40 mg PRN Stop K supp Follow up with Dr Haroldine Laws in 2 months  Georgiana Shore, NP 05/10/18   Patient seen and examined with the above-signed Advanced Practice Provider and/or Housestaff. I personally reviewed laboratory data, imaging studies and relevant notes. I independently examined the patient and formulated the important aspects of the plan. I have edited the note to reflect any of my changes or salient points. I have personally discussed the plan with the patient and/or family.  72 y/o with ischemic CM now with recent drop in EF and worsening HF symptoms. Symptomatically improved with changes in diuretics. On good HF medicines - switching to [place] today. No significant volume overload on exam. No s3. Does have chronic AF that is rate controlled. I discussed with [name] and he will need R/L cath to evalaute reason for  drop in EF and recent CP. We have scheduled for 2 weeks. Call sooner if symptoms worsen. We will follow in HF clinic.   Glori Bickers, MD  12:38 PM

## 2018-05-10 NOTE — Telephone Encounter (Signed)
Received message from [facility] that patient will need L/RHC with [name] in 2 weeks. Called and spoke with patient who requests August 23 as he will be out of town next week and his ride will be out of town the week after. He requests a call the week of August 12 to confirm cath date/time and to review instructions.

## 2018-05-14 ENCOUNTER — Telehealth: Payer: Self-pay | Admitting: *Deleted

## 2018-05-14 ENCOUNTER — Encounter (INDEPENDENT_AMBULATORY_CARE_PROVIDER_SITE_OTHER): Payer: Self-pay

## 2018-05-14 DIAGNOSIS — I25118 Atherosclerotic heart disease of native coronary artery with other forms of angina pectoris: Secondary | ICD-10-CM

## 2018-05-14 DIAGNOSIS — Z01812 Encounter for preprocedural laboratory examination: Secondary | ICD-10-CM

## 2018-05-14 DIAGNOSIS — I482 Chronic atrial fibrillation, unspecified: Secondary | ICD-10-CM

## 2018-05-14 DIAGNOSIS — I5042 Chronic combined systolic (congestive) and diastolic (congestive) heart failure: Secondary | ICD-10-CM

## 2018-05-14 NOTE — Telephone Encounter (Addendum)
Pt's granddaughter, Chilton Greathouse  [facility]), contacted pre catheterization scheduled at [facility] for: Friday June 01, 2018 7: 30 AM Verified arrival time and place: [place] Entrance A at: 5:30 AM Pre-procedure BMP/CBC scheduled for Monday May 28, 2018 at the [facility].  No solid food after midnight prior to cath, clear liquids until 5 AM day of procedure.  Hold: Metformin AM of procedure and 48 hours post procedure. Januvia AM of procedure. Jardiance AM of procedure. Glipizide AM of procedure. Furosemide AM of procedure. Spironolactone AM of procedure. Pradaxa- 48 hours pre-procedure-last dose Tuesday May 29, 2018.  Except hold medications AM meds can be  taken pre-cath with sip of water including: Clopidogrel 75 mg ASA 81 mg-will leave a sample bottle of Aspirin 81 mg #32 Lot/Exp [id number] BD 12/20 for pt to pick up when he comes for lab on Monday May 28, 2018.  Confirmed patient has responsible person to drive home post procedure and for 24 hours after you arrive home: yes

## 2018-05-14 NOTE — Telephone Encounter (Signed)
Per Dr Otelia Limes BNP to lab orders for 05/28/18, this has been done.

## 2018-05-14 NOTE — Addendum Note (Signed)
Addended by: Katrine Coho on: 05/14/2018 03:18 PM   Modules accepted: Orders

## 2018-05-16 ENCOUNTER — Ambulatory Visit: Payer: Medicare Other | Admitting: Nutrition

## 2018-05-22 NOTE — Telephone Encounter (Signed)
See 8/5 phone note.

## 2018-05-28 ENCOUNTER — Encounter: Payer: Self-pay | Admitting: Nutrition

## 2018-05-28 ENCOUNTER — Encounter: Payer: Medicare Other | Attending: Family Medicine | Admitting: Nutrition

## 2018-05-28 ENCOUNTER — Other Ambulatory Visit: Payer: Medicare Other | Admitting: *Deleted

## 2018-05-28 VITALS — Ht 69.0 in | Wt 276.0 lb

## 2018-05-28 DIAGNOSIS — E1151 Type 2 diabetes mellitus with diabetic peripheral angiopathy without gangrene: Secondary | ICD-10-CM | POA: Insufficient documentation

## 2018-05-28 DIAGNOSIS — I5042 Chronic combined systolic (congestive) and diastolic (congestive) heart failure: Secondary | ICD-10-CM

## 2018-05-28 DIAGNOSIS — Z713 Dietary counseling and surveillance: Secondary | ICD-10-CM | POA: Diagnosis not present

## 2018-05-28 DIAGNOSIS — E669 Obesity, unspecified: Secondary | ICD-10-CM

## 2018-05-28 DIAGNOSIS — E118 Type 2 diabetes mellitus with unspecified complications: Secondary | ICD-10-CM

## 2018-05-28 DIAGNOSIS — I482 Chronic atrial fibrillation, unspecified: Secondary | ICD-10-CM

## 2018-05-28 DIAGNOSIS — Z01812 Encounter for preprocedural laboratory examination: Secondary | ICD-10-CM

## 2018-05-28 DIAGNOSIS — E1165 Type 2 diabetes mellitus with hyperglycemia: Secondary | ICD-10-CM

## 2018-05-28 DIAGNOSIS — I25118 Atherosclerotic heart disease of native coronary artery with other forms of angina pectoris: Secondary | ICD-10-CM

## 2018-05-28 DIAGNOSIS — IMO0002 Reserved for concepts with insufficient information to code with codable children: Secondary | ICD-10-CM

## 2018-05-28 LAB — CBC
HEMOGLOBIN: 13.7 g/dL (ref 13.0–17.7)
Hematocrit: 41.3 % (ref 37.5–51.0)
MCH: 25.2 pg — ABNORMAL LOW (ref 26.6–33.0)
MCHC: 33.2 g/dL (ref 31.5–35.7)
MCV: 76 fL — ABNORMAL LOW (ref 79–97)
PLATELETS: 191 10*3/uL (ref 150–450)
RBC: 5.44 x10E6/uL (ref 4.14–5.80)
RDW: 18.5 % — ABNORMAL HIGH (ref 12.3–15.4)
WBC: 10.2 10*3/uL (ref 3.4–10.8)

## 2018-05-28 LAB — BASIC METABOLIC PANEL
BUN/Creatinine Ratio: 13 (ref 10–24)
BUN: 17 mg/dL (ref 8–27)
CALCIUM: 8.9 mg/dL (ref 8.6–10.2)
CHLORIDE: 100 mmol/L (ref 96–106)
CO2: 21 mmol/L (ref 20–29)
Creatinine, Ser: 1.27 mg/dL (ref 0.76–1.27)
GFR calc Af Amer: 65 mL/min/{1.73_m2} (ref >59)
GFR calc non Af Amer: 56 mL/min/{1.73_m2} — ABNORMAL LOW (ref >59)
GLUCOSE: 147 mg/dL — AB (ref 65–99)
POTASSIUM: 4.8 mmol/L (ref 3.5–5.2)
SODIUM: 139 mmol/L (ref 134–144)

## 2018-05-28 LAB — PRO B NATRIURETIC PEPTIDE: NT-PRO BNP: 646 pg/mL — AB (ref 0–376)

## 2018-05-28 NOTE — Patient Instructions (Signed)
Goals 1. Follow MY Plate 2. Increase fresh fruits and vegetables. 3. Don't skip meals 3. Eat 2-3 carb choices per meal 4. Avoid frieds and processed foods Don't eat past 7 pm No snacks unless veggies.

## 2018-05-28 NOTE — Progress Notes (Signed)
Medical Nutrition Therapy:  Appt start time: 0800  end time:  930 Assessment:  Primary concerns today: DIabetes Type 2, Obesity and CVD. Lives with his wife. Eats out twice a day.  PCP Tora Duck. [name] is Heart MD.  Dr. Manuela Neptune is DM MD..  Most recent A1C 6.9%. Has DM Type for 7-10 yrs. Mostly drives around grandkids.  Use to do cardiac classes at the [facility]. Attended [facility]. Check BS 3 times per week. Jardiance taking every other day. Januvia and Glipizde taking daily. Complains of urinating a lot and being hungry at times to snack. Sometimes skips meals. Meals are not eating on schedule. Doesn't like to eat past 530 pm. Not exercising routinely right now.. Wants to lose some weight. .Having angioplasty on Friday, [facility]. Current diet inconsistent to meet his needs.   CMP Latest Ref Rng & Units 05/28/2018 05/09/2018 04/25/2018  Glucose 65 - 99 mg/dL 147(H) 128(H) 159(H)  BUN 8 - 27 mg/dL 17 16 14   Creatinine 0.76 - 1.27 mg/dL 1.27 1.12 1.09  Sodium 134 - 144 mmol/L 139 142 141  Potassium 3.5 - 5.2 mmol/L 4.8 4.9 4.8  Chloride 96 - 106 mmol/L 100 100 99  CO2 20 - 29 mmol/L 21 25 25   Calcium 8.6 - 10.2 mg/dL 8.9 9.2 9.3  Total Protein 6.0 - 8.5 g/dL - - -  Total Bilirubin 0.0 - 1.2 mg/dL - - -  Alkaline Phos 39 - 117 IU/L - - -  AST 0 - 40 IU/L - - -  ALT 0 - 44 IU/L - - -   Lipid Panel     Component Value Date/Time   CHOL 74 (L) 02/07/2018 0859   TRIG 82 02/07/2018 0859   HDL 45 02/07/2018 0859   CHOLHDL 1.6 02/07/2018 0859   CHOLHDL 1.7 10/25/2015 0518   VLDL 16 10/25/2015 0518   LDLCALC 13 02/07/2018 0859   LDLDIRECT 18.0 06/13/2017 1504       Preferred Learning Style:   No preference indicated   Learning Readiness:  Ready  Change in progress   MEDICATIONS:  DIETARY INTAKE:   24-hr recall:  B ( 07-1029 AM): 2 eggs. tomatoes 2 slices [place] toast, coffee cream and splenda Snk ( AM):  L ( PM): skip lunch Snk ( PM): D ( 4  PM):  Meatloaf, string beans, and fruit bowl, water Snk ( PM): popcorn occasionally, or fruit Beverages: water   Estimated energy needs: 1800  calories 200  g carbohydrates 135 g protein  43 fat  Progress Towards Goal(s):  Resolved.   Nutritional Diagnosis:  NB-1.1 Food and nutrition-related knowledge deficit As related to Diabetes Type 2.  As evidenced by A1C.    Intervention:  Nutrition and Diabetes education provided on My Plate, CHO counting, meal planning, portion sizes, timing of meals, avoiding snacks between meals unless having a low blood sugar, target ranges for A1C and blood sugars, signs/symptoms and treatment of hyper/hypoglycemia, monitoring blood sugars, taking medications as prescribed, benefits of exercising 30 minutes per day and prevention of complications of DM. Heart Healthy Diet, weight loss tips. Goals 1. Follow MY Plate 2. Increase fresh fruits and vegetables. 3. Don't skip meals 3. Eat 2-3 carb choices per meal 4. Avoid frieds and processed foods Don't eat past 7 pm No snacks unless veggies.  Teaching Method Utilized:  Visual Auditory Hands on  Handouts given during visit include:  The Plate MEthod   Meal Plan Card   Barriers to  learning/adherence to lifestyle change: none  Demonstrated degree of understanding via:  Teach Back   Monitoring/Evaluation:  Dietary intake, exercise, meal planning , and body weight in 1 month(s).Would recommend to stop Jardiance as risks outweigh benefits. Consider stopping Glipizide as contributes to weight gain and hypoglycemia. Would benefit from increasing his Metformin for better blood sugar control.

## 2018-05-30 ENCOUNTER — Ambulatory Visit: Payer: Medicare Other | Admitting: Physician Assistant

## 2018-05-31 ENCOUNTER — Telehealth: Payer: Self-pay | Admitting: *Deleted

## 2018-05-31 NOTE — Telephone Encounter (Signed)
Pt contacted pre-catheterization scheduled at [facility] for: Friday June 01, 2018 7:30 AM Verified arrival time and place: [place] Entrance A at: 5:30 AM  No solid food after midnight prior to cath, clear liquids until 5 AM day of procedure. Verified no known allergies.   Hold: Pradaxa-05/30/18 until post procedure. Furosemide AM of procedure. Spironolactone AM of procedure.  Metformin-day of procedure and 48 hours post procedure. Entresto -AM of procedure. Jardiance -AM of procedure. Januvia-AM of procedure Glipizide -AM of procedure.  Except hold medications AM meds can be  taken pre-cath with sip of water including: ASA 81 mg Clopidogrel 75 mg  Confirmed patient has responsible person to drive home post procedure and for 24 hours after you arrive home: yes

## 2018-06-01 ENCOUNTER — Ambulatory Visit (HOSPITAL_COMMUNITY)
Admission: RE | Admit: 2018-06-01 | Discharge: 2018-06-01 | Disposition: A | Discharge status: Home / Self Care | Payer: Medicare Other | Source: Ambulatory Visit | Attending: Cardiovascular Disease | Admitting: Cardiovascular Disease

## 2018-06-01 ENCOUNTER — Other Ambulatory Visit: Payer: Self-pay

## 2018-06-01 ENCOUNTER — Encounter (HOSPITAL_COMMUNITY): Payer: Self-pay | Admitting: Cardiovascular Disease

## 2018-06-01 ENCOUNTER — Encounter (HOSPITAL_COMMUNITY)
Admission: RE | Disposition: A | Discharge status: Home / Self Care | Payer: Self-pay | Source: Ambulatory Visit | Attending: Cardiovascular Disease

## 2018-06-01 DIAGNOSIS — Z8673 Personal history of transient ischemic attack (TIA), and cerebral infarction without residual deficits: Secondary | ICD-10-CM | POA: Insufficient documentation

## 2018-06-01 DIAGNOSIS — Z6839 Body mass index (BMI) 39.0-39.9, adult: Secondary | ICD-10-CM | POA: Insufficient documentation

## 2018-06-01 DIAGNOSIS — Z87891 Personal history of nicotine dependence: Secondary | ICD-10-CM | POA: Insufficient documentation

## 2018-06-01 DIAGNOSIS — I2584 Coronary atherosclerosis due to calcified coronary lesion: Secondary | ICD-10-CM | POA: Insufficient documentation

## 2018-06-01 DIAGNOSIS — Z7984 Long term (current) use of oral hypoglycemic drugs: Secondary | ICD-10-CM | POA: Insufficient documentation

## 2018-06-01 DIAGNOSIS — I2582 Chronic total occlusion of coronary artery: Secondary | ICD-10-CM | POA: Insufficient documentation

## 2018-06-01 DIAGNOSIS — I255 Ischemic cardiomyopathy: Secondary | ICD-10-CM | POA: Diagnosis not present

## 2018-06-01 DIAGNOSIS — I252 Old myocardial infarction: Secondary | ICD-10-CM | POA: Diagnosis not present

## 2018-06-01 DIAGNOSIS — Z7902 Long term (current) use of antithrombotics/antiplatelets: Secondary | ICD-10-CM | POA: Diagnosis not present

## 2018-06-01 DIAGNOSIS — Z955 Presence of coronary angioplasty implant and graft: Secondary | ICD-10-CM | POA: Insufficient documentation

## 2018-06-01 DIAGNOSIS — G4733 Obstructive sleep apnea (adult) (pediatric): Secondary | ICD-10-CM | POA: Diagnosis not present

## 2018-06-01 DIAGNOSIS — I2510 Atherosclerotic heart disease of native coronary artery without angina pectoris: Secondary | ICD-10-CM | POA: Diagnosis not present

## 2018-06-01 DIAGNOSIS — Z8249 Family history of ischemic heart disease and other diseases of the circulatory system: Secondary | ICD-10-CM | POA: Diagnosis not present

## 2018-06-01 DIAGNOSIS — Z7901 Long term (current) use of anticoagulants: Secondary | ICD-10-CM | POA: Insufficient documentation

## 2018-06-01 DIAGNOSIS — I110 Hypertensive heart disease with heart failure: Secondary | ICD-10-CM | POA: Diagnosis not present

## 2018-06-01 DIAGNOSIS — E785 Hyperlipidemia, unspecified: Secondary | ICD-10-CM | POA: Diagnosis not present

## 2018-06-01 DIAGNOSIS — I5043 Acute on chronic combined systolic (congestive) and diastolic (congestive) heart failure: Secondary | ICD-10-CM | POA: Insufficient documentation

## 2018-06-01 DIAGNOSIS — E119 Type 2 diabetes mellitus without complications: Secondary | ICD-10-CM | POA: Diagnosis not present

## 2018-06-01 DIAGNOSIS — E039 Hypothyroidism, unspecified: Secondary | ICD-10-CM | POA: Insufficient documentation

## 2018-06-01 DIAGNOSIS — I482 Chronic atrial fibrillation: Secondary | ICD-10-CM | POA: Insufficient documentation

## 2018-06-01 HISTORY — PX: RIGHT/LEFT HEART CATH AND CORONARY ANGIOGRAPHY: CATH118266

## 2018-06-01 LAB — POCT I-STAT 3, ART BLOOD GAS (G3+)
ACID-BASE DEFICIT: 1 mmol/L (ref 0.0–2.0)
BICARBONATE: 24.4 mmol/L (ref 20.0–28.0)
O2 SAT: 97 %
PCO2 ART: 41.1 mmHg (ref 32.0–48.0)
PO2 ART: 90 mmHg (ref 83.0–108.0)
TCO2: 26 mmol/L (ref 22–32)
pH, Arterial: 7.381 (ref 7.350–7.450)

## 2018-06-01 LAB — POCT I-STAT 3, VENOUS BLOOD GAS (G3P V)
BICARBONATE: 25.3 mmol/L (ref 20.0–28.0)
O2 Saturation: 62 %
PCO2 VEN: 42.4 mmHg — AB (ref 44.0–60.0)
PH VEN: 7.384 (ref 7.250–7.430)
PO2 VEN: 33 mmHg (ref 32.0–45.0)
TCO2: 27 mmol/L (ref 22–32)

## 2018-06-01 LAB — GLUCOSE, CAPILLARY: GLUCOSE-CAPILLARY: 151 mg/dL — AB (ref 70–99)

## 2018-06-01 SURGERY — RIGHT/LEFT HEART CATH AND CORONARY ANGIOGRAPHY
Anesthesia: LOCAL

## 2018-06-01 MED ORDER — LIDOCAINE HCL (PF) 1 % IJ SOLN
INTRAMUSCULAR | Status: AC
  Filled 2018-06-01: qty 30

## 2018-06-01 MED ORDER — HEPARIN (PORCINE) IN NACL 1000-0.9 UT/500ML-% IV SOLN
INTRAVENOUS | Status: AC
  Filled 2018-06-01: qty 1000

## 2018-06-01 MED ORDER — SODIUM CHLORIDE 0.9% FLUSH: 3.0000 mL | Freq: Two times a day (BID) | INTRAVENOUS | Status: DC

## 2018-06-01 MED ORDER — VERAPAMIL HCL 2.5 MG/ML IV SOLN
INTRAVENOUS | Status: AC
  Filled 2018-06-01: qty 2

## 2018-06-01 MED ORDER — ONDANSETRON HCL 4 MG/2ML IJ SOLN: 4.0000 mg | Freq: Four times a day (QID) | INTRAMUSCULAR | Status: DC | PRN

## 2018-06-01 MED ORDER — LIDOCAINE HCL (PF) 1 % IJ SOLN
INTRAMUSCULAR | Status: DC | PRN
  Administered 2018-06-01 (×2): 2 mL via INTRADERMAL

## 2018-06-01 MED ORDER — ASPIRIN 81 MG PO CHEW: 81.0000 mg | CHEWABLE_TABLET | ORAL | Status: DC

## 2018-06-01 MED ORDER — SODIUM CHLORIDE 0.9 % IV SOLN
INTRAVENOUS | Status: DC
  Administered 2018-06-01: 07:00:00 via INTRAVENOUS

## 2018-06-01 MED ORDER — CLOPIDOGREL BISULFATE 75 MG PO TABS: 75.0000 mg | ORAL_TABLET | ORAL | Status: DC

## 2018-06-01 MED ORDER — MIDAZOLAM HCL 2 MG/2ML IJ SOLN
INTRAMUSCULAR | Status: AC
  Filled 2018-06-01: qty 2

## 2018-06-01 MED ORDER — IOHEXOL 350 MG/ML SOLN
INTRAVENOUS | Status: DC | PRN
  Administered 2018-06-01: 70 mL via INTRA_ARTERIAL

## 2018-06-01 MED ORDER — FENTANYL CITRATE (PF) 100 MCG/2ML IJ SOLN
INTRAMUSCULAR | Status: AC
  Filled 2018-06-01: qty 2

## 2018-06-01 MED ORDER — SODIUM CHLORIDE 0.9 % IV SOLN: INTRAVENOUS | Status: DC

## 2018-06-01 MED ORDER — VERAPAMIL HCL 2.5 MG/ML IV SOLN
INTRAVENOUS | Status: DC | PRN
  Administered 2018-06-01: 10 mL via INTRA_ARTERIAL

## 2018-06-01 MED ORDER — SODIUM CHLORIDE 0.9 % IV SOLN: 250.0000 mL | INTRAVENOUS | Status: DC | PRN

## 2018-06-01 MED ORDER — MIDAZOLAM HCL 2 MG/2ML IJ SOLN
INTRAMUSCULAR | Status: DC | PRN
  Administered 2018-06-01: 1 mg via INTRAVENOUS

## 2018-06-01 MED ORDER — FENTANYL CITRATE (PF) 100 MCG/2ML IJ SOLN
INTRAMUSCULAR | Status: DC | PRN
  Administered 2018-06-01: 25 ug via INTRAVENOUS

## 2018-06-01 MED ORDER — SODIUM CHLORIDE 0.9% FLUSH: 3.0000 mL | INTRAVENOUS | Status: DC | PRN

## 2018-06-01 MED ORDER — HEPARIN (PORCINE) IN NACL 1000-0.9 UT/500ML-% IV SOLN
INTRAVENOUS | Status: DC | PRN
  Administered 2018-06-01 (×2): 500 mL

## 2018-06-01 MED ORDER — ACETAMINOPHEN 325 MG PO TABS: 650.0000 mg | ORAL_TABLET | ORAL | Status: DC | PRN

## 2018-06-01 SURGICAL SUPPLY — 13 items
CATH BALLN WEDGE 5F 110CM (CATHETERS) ×2 IMPLANT
CATH INFINITI 5 FR AR1 MOD (CATHETERS) ×2 IMPLANT
CATH INFINITI 5FR AL1 (CATHETERS) ×2 IMPLANT
DEVICE RAD COMP TR BAND LRG (VASCULAR PRODUCTS) ×2 IMPLANT
GLIDESHEATH SLEND SS 6F .021 (SHEATH) ×2 IMPLANT
GUIDEWIRE .025 260CM (WIRE) ×2 IMPLANT
GUIDEWIRE INQWIRE 1.5J.035X260 (WIRE) ×1 IMPLANT
INQWIRE 1.5J .035X260CM (WIRE) ×2
KIT HEART LEFT (KITS) ×2 IMPLANT
PACK CARDIAC CATHETERIZATION (CUSTOM PROCEDURE TRAY) ×2 IMPLANT
SHEATH GLIDE SLENDER 4/5FR (SHEATH) ×2 IMPLANT
TRANSDUCER W/STOPCOCK (MISCELLANEOUS) ×2 IMPLANT
TUBING CIL FLEX 10 FLL-RA (TUBING) ×2 IMPLANT

## 2018-06-01 NOTE — Discharge Instructions (Signed)
**Note -identified via Obfuscation** Radial Site Care °Refer to this sheet in the next few weeks. These instructions provide you with information about caring for yourself after your procedure. Your health care provider may also give you more specific instructions. Your treatment has been planned according to current medical practices, but problems sometimes occur. Call your health care provider if you have any problems or questions after your procedure. °What can I expect after the procedure? °After your procedure, it is typical to have the following: °· Bruising at the radial site that usually fades within 1-2 weeks. °· Blood collecting in the tissue (hematoma) that may be painful to the touch. It should usually decrease in size and tenderness within 1-2 weeks. ° °Follow these instructions at home: °· Take medicines only as directed by your health care provider. °· You may shower 24-48 hours after the procedure or as directed by your health care provider. Remove the bandage (dressing) and gently wash the site with plain soap and water. Pat the area dry with a clean towel. Do not rub the site, because this may cause bleeding. °· Do not take baths, swim, or use a hot tub until your health care provider approves. °· Check your insertion site every day for redness, swelling, or drainage. °· Do not apply powder or lotion to the site. °· Do not flex or bend the affected arm for 24 hours or as directed by your health care provider. °· Do not push or pull heavy objects with the affected arm for 24 hours or as directed by your health care provider. °· Do not lift over 10 lb (4.5 kg) for 5 days after your procedure or as directed by your health care provider. °· Ask your health care provider when it is okay to: °? Return to work or school. °? Resume usual physical activities or sports. °? Resume sexual activity. °· Do not drive home if you are discharged the same day as the procedure. Have someone else drive you. °· You may drive 24 hours after the procedure  unless otherwise instructed by your health care provider. °· Do not operate machinery or power tools for 24 hours after the procedure. °· If your procedure was done as an outpatient procedure, which means that you went home the same day as your procedure, a responsible adult should be with you for the first 24 hours after you arrive home. °· Keep all follow-up visits as directed by your health care provider. This is important. °Contact a health care provider if: °· You have a fever. °· You have chills. °· You have increased bleeding from the radial site. Hold pressure on the site. °Get help right away if: °· You have unusual pain at the radial site. °· You have redness, warmth, or swelling at the radial site. °· You have drainage (other than a small amount of blood on the dressing) from the radial site. °· The radial site is bleeding, and the bleeding does not stop after 30 minutes of holding steady pressure on the site. °· Your arm or hand becomes pale, cool, tingly, or numb. °This information is not intended to replace advice given to you by your health care provider. Make sure you discuss any questions you have with your health care provider. °Document Released: 10/29/2010 Document Revised: 03/03/2016 Document Reviewed: 04/14/2014 °Elsevier Interactive Patient Education © 2018 [employer] ° °

## 2018-06-01 NOTE — Interval H&P Note (Signed)
Cath Lab Visit (complete for each Cath Lab visit)  Clinical Evaluation Leading to the Procedure:   ACS: No.  Non-ACS:    Anginal Classification: No Symptoms  Anti-ischemic medical therapy: Minimal Therapy (1 class of medications)  Non-Invasive Test Results: High-risk stress test findings: cardiac mortality >3%/year  Prior CABG: No previous CABG  Progressive heart failure, now with echo showing LVEF 25% (high risk stress test equivalent)    History and Physical Interval Note:  06/01/2018 6:09 AM  [name]  has presented today for surgery, with the diagnosis of CHF/CAD  The various methods of treatment have been discussed with the patient and family. After consideration of risks, benefits and other options for treatment, the patient has consented to  Procedure(s): RIGHT/LEFT HEART CATH AND CORONARY ANGIOGRAPHY (N/A) as a surgical intervention .  The patient's history has been reviewed, patient examined, no change in status, stable for surgery.  I have reviewed the patient's chart and labs.  Questions were answered to the patient's satisfaction.     Sherren Mocha

## 2018-06-06 ENCOUNTER — Ambulatory Visit (INDEPENDENT_AMBULATORY_CARE_PROVIDER_SITE_OTHER): Payer: Medicare Other | Admitting: Podiatry

## 2018-06-06 DIAGNOSIS — M79675 Pain in left toe(s): Secondary | ICD-10-CM | POA: Diagnosis not present

## 2018-06-06 DIAGNOSIS — M79674 Pain in right toe(s): Secondary | ICD-10-CM | POA: Diagnosis not present

## 2018-06-06 DIAGNOSIS — B351 Tinea unguium: Secondary | ICD-10-CM

## 2018-06-06 DIAGNOSIS — L309 Dermatitis, unspecified: Secondary | ICD-10-CM

## 2018-06-06 NOTE — Progress Notes (Signed)
Subjective:   Patient ID: [name], male   DOB: 72 y.o.   MRN: [id number]   HPI Patient presents with elongated nailbeds 1-5 both feet that are thick and painful   ROS      Objective:  Physical Exam  Neurovascular status intact with thick yellow brittle nailbeds 1-5 both feet     Assessment:  Mycotic nail infection with pain 1-5 both feet     Plan:  Chronic painful nails that were debrided today 1-5 both feet with no iatrogenic bleeding reappoint 3 months

## 2018-06-13 ENCOUNTER — Other Ambulatory Visit: Payer: Self-pay | Admitting: Cardiovascular Disease

## 2018-06-15 ENCOUNTER — Telehealth: Payer: Self-pay | Admitting: *Deleted

## 2018-06-15 NOTE — Telephone Encounter (Signed)
Pt called back and decline that Beat Device at this time.

## 2018-06-20 ENCOUNTER — Telehealth: Payer: Self-pay | Admitting: Internal Medicine

## 2018-06-20 ENCOUNTER — Other Ambulatory Visit: Payer: Self-pay | Admitting: Internal Medicine

## 2018-06-20 ENCOUNTER — Ambulatory Visit (INDEPENDENT_AMBULATORY_CARE_PROVIDER_SITE_OTHER): Payer: Medicare Other | Admitting: Internal Medicine

## 2018-06-20 ENCOUNTER — Other Ambulatory Visit: Payer: Self-pay | Admitting: Cardiovascular Disease

## 2018-06-20 ENCOUNTER — Encounter: Payer: Self-pay | Admitting: Internal Medicine

## 2018-06-20 VITALS — BP 120/80 | HR 89 | Ht 69.0 in | Wt 273.2 lb

## 2018-06-20 DIAGNOSIS — I2510 Atherosclerotic heart disease of native coronary artery without angina pectoris: Secondary | ICD-10-CM

## 2018-06-20 DIAGNOSIS — Z23 Encounter for immunization: Secondary | ICD-10-CM | POA: Diagnosis not present

## 2018-06-20 DIAGNOSIS — E785 Hyperlipidemia, unspecified: Secondary | ICD-10-CM | POA: Diagnosis not present

## 2018-06-20 DIAGNOSIS — E1151 Type 2 diabetes mellitus with diabetic peripheral angiopathy without gangrene: Secondary | ICD-10-CM

## 2018-06-20 NOTE — Progress Notes (Signed)
Patient ID: [name], male   DOB: December 28, 1945, 72 y.o.   MRN: [id number]  HPI: [name] is a 72 y.o.-year-old male, returning for DM2, dx 2004, non-insulin-dependent, uncontrolled, with complications (CAD, s/p MI). Last visit 4 months ago. He has [facility] as supplemental and [employer] for meds in 10/2015.  Since last visit, he developed shortness of breath and chest discomfort.  He had a 2D echo which showed an EF of 25 to 30%.  His Lasix was increased to twice a day (takes the second dose as needed).  He had a recent cardiac cath >> no significant obstruction.  He was started on Entresto.  Before last visit, he is on nutrition.  He changed his diet afterwards, increasing his amounts of fruits and vegetables and decreasing the amount of eggs he was eating.  Sugars greatly improved.  At this visit, they are little higher, as he relaxed his diet.  Last hemoglobin A1c was: 02/13/2018: The HbA1c calculated from the fructosamine is excellent, at 6.3%! 10/16/2017: HbA1c calculated from fructosamine is: 7.3% (higher). 06/16/2017: HbA1c calculated from the fructosamine is stable, at 6.8%.  12/08/2016: HbA1c calculated from the fructosamine is better, at 6.86%. 08/10/2016: HbA1c calculated from  fructosamine is 7.88% 05/10/2016: HbA1c calculated from fructosamine is 7.6% 01/06/2016: HbA1c calculated from fructosamine is 6.6% 09/06/2016: HbA1c calculated from fructosamine is 7.0% 06/01/2015: HbA1c calculated from fructosamine is 7.0% 02/23/2015: HbA1c calculated from fructosamine is 6.88% 10/15/2014: HbA1c calculated from fructosamine is 7.17% Lab Results  Component Value Date   HGBA1C 8.8 06/16/2017   HGBA1C 9.3 03/10/2017   HGBA1C 7.6 (H) 10/25/2015  He started to change his eating habits since 03/2013.   Pt is on a regimen of: - Glipizide 10 mg 2x a day - Metformin 1000 mg 2x a day - Januvia 100 mg daily    - Jardiance 10 mg in a.m., every other day due to increased urination He  does not miss doses.  Pt checks his sugars 4 times a day: - am: 109-137 >> 112-156, 206 >> 112-159 - 2h after breakfast: 141-174 >> 104-126, 185 >> 126-170 - before lunch 144 >> 110-120s >> n/c  - before dinner:102-130 >> 84, 119-175, 287 >> 102-137 - 2h after dinner: 149-179 >> 89-144, 166 >> 128-150 No lows; he has hypoglycemia awareness in the 60s. Highest: 287 >> 170  His wife is a Radiographer, therapeutic >> he does eat sweets. He goes to [employer] place -3 times a week, Monday Wednesday Friday, 1 hour sessions, starting 8-9 a.m.  He started to see [name] (nutritionist)  -+ Mild CKD, last BUN/creatinine:  Lab Results  Component Value Date   BUN 17 05/28/2018   CREATININE 1.27 05/28/2018  On Entresto now << prev.: Benicar 40.  -+ HL;  last set of lipids: Lab Results  Component Value Date   CHOL 74 (L) 02/07/2018   HDL 45 02/07/2018   LDLCALC 13 02/07/2018   LDLDIRECT 18.0 06/13/2017   TRIG 82 02/07/2018   CHOLHDL 1.6 02/07/2018  He was in the Reveal Study x 4 years >> stopped. He is on Lipitor + omega-3 fatty acids.  - last eye exam was 10/2017: No DR ([name]).  + Mild cataracts  -Denies numbness and tingling in his feet.  He also has a history of OSA-on CPAP, very compliant , A. Fib-on Plavix and pradaxa, IBD, GERD, vitamin B12 deficiency - previously on im B12, obesity.  He also has hypothyroidism and is on levothyroxine.  Latest TSH was normal: Lab Results  Component Value Date   TSH 0.86 06/13/2017   ROS: Constitutional: no weight gain/no weight loss, no fatigue, no subjective hyperthermia, no subjective hypothermia Eyes: no blurry vision, no xerophthalmia ENT: no sore throat, no nodules palpated in throat, no dysphagia, no odynophagia, no hoarseness Cardiovascular: no CP/+ SOB/no palpitations/no leg swelling Respiratory: no cough/+ SOB/no wheezing Gastrointestinal: no N/no V/no D/no C/no acid reflux Musculoskeletal: no muscle aches/no joint  aches Skin: no rashes, no hair loss Neurological: no tremors/no numbness/no tingling/no dizziness  I reviewed pt's medications, allergies, PMH, social hx, family hx, and changes were documented in the history of present illness. Otherwise, unchanged from my initial visit note. He doubled the Lasix dose since last visit (second dose of the day is prn)  Past Medical History:  Diagnosis Date  . Atrial fibrillation ([place])    initial diagnoses 2012  . B12 deficiency    per patient previously taking shots  . CORONARY ARTERY DISEASE 04/12/2007   2 stents last in 2006.   Marland Kitchen DIABETES MELLITUS, TYPE II 04/16/2007  . Diverticulosis of colon (without mention of hemorrhage)   . HYPERLIPIDEMIA 04/16/2007   reveal study. not sure of medication  . HYPERTENSION 04/12/2007  . HYPOTHYROIDISM 04/12/2007  . Ischemic cardiomyopathy    cath 35-40%  . MYOCARDIAL INFARCTION, HX OF 04/12/2007  . OBESITY 06/16/2009  . SLEEP APNEA 04/12/2007   CPAP  . Stroke ([place])   . ULCERATIVE COLITIS, LEFT SIDED 11/23/2010   Past Surgical History:  Procedure Laterality Date  . CARDIAC CATHETERIZATION  10/2002   STENT. 2 stents Dr. Maurene Capes  . CARDIAC CATHETERIZATION N/A 10/28/2015   Procedure: Right/Left Heart Cath and Coronary Angiography;  Surgeon: Sherren Mocha, MD;  Location: [facility];  Service: Cardiovascular;  Laterality: N/A;  . CORONARY STENT PLACEMENT  2008   LAD   . RIGHT/LEFT HEART CATH AND CORONARY ANGIOGRAPHY N/A 06/01/2018   Procedure: RIGHT/LEFT HEART CATH AND CORONARY ANGIOGRAPHY;  Surgeon: Sherren Mocha, MD;  Location: [facility];  Service: Cardiovascular;  Laterality: N/A;  . TONSILLECTOMY     Social History   Socioeconomic History  . Marital status: Married    Spouse name: Not on file  . Number of children: 1  . Years of education: Not on file  . Highest education level: Not on file  Occupational History  . Occupation: RETIRED    Employer: RETIRED  Social Needs  . Financial resource strain:  Not on file  . Food insecurity:    Worry: Not on file    Inability: Not on file  . Transportation needs:    Medical: Not on file    Non-medical: Not on file  Tobacco Use  . Smoking status: Former Smoker    Packs/day: 1.00    Years: 5.00    Pack years: 5.00    Types: Cigarettes    Last attempt to quit: 04/13/1966    Years since quitting: 52.2  . Smokeless tobacco: Never Used  Substance and Sexual Activity  . Alcohol use: No    Alcohol/week: 0.0 standard drinks    Comment: no more beer  . Drug use: No  . Sexual activity: Not on file  Lifestyle  . Physical activity:    Days per week: Not on file    Minutes per session: Not on file  . Stress: Not on file  Relationships  . Social connections:    Talks on phone: Not on file  Gets together: Not on file    Attends religious service: Not on file    Active member of club or organization: Not on file    Attends meetings of clubs or organizations: Not on file    Relationship status: Not on file  . Intimate partner violence:    Fear of current or ex partner: Not on file    Emotionally abused: Not on file    Physically abused: Not on file    Forced sexual activity: Not on file  Other Topics Concern  . Not on file  Social History Narrative   Cardiorehab 3 days a week. 45 minutes to an hour-stationary bike.    GRANDDAUGHTER (MS. 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       Married for 37 years in 2015, married previously for 14 years. Daughter with first wife and 3 grandkids.    Lives alone with wife. Get to see grandkids a lot. New grandchild in middle of 30      Hobbies-yardwork, previously liked to hunt and fish, does some target shooting   Current Outpatient Medications on File Prior to Visit  Medication Sig Dispense Refill  . atorvastatin (LIPITOR) 20 MG tablet Take 1 tablet (20 mg total) by mouth daily. 90 tablet 3  .  carvedilol (COREG) 25 MG tablet TAKE ONE TABLET TWICE DAILY WITH A MEAL (Patient taking differently: Take 25 mg by mouth 2 (two) times daily with a meal. ) 60 tablet 10  . clopidogrel (PLAVIX) 75 MG tablet TAKE ONE (1) TABLET EACH DAY (Patient taking differently: Take 75 mg by mouth daily. TAKE ONE (1) TABLET EACH DAY) 90 tablet 1  . fish oil-omega-3 fatty acids 1000 MG capsule Take 2 g by mouth daily.     . furosemide (LASIX) 40 MG tablet Take 1 tablet (40 mg total) by mouth daily. Take extra tablet once daily in the PM as needed for weight gain 3 lbs in 24 hrs or swelling. 30 tablet 11  . glipiZIDE (GLUCOTROL) 10 MG tablet TAKE ONE TABLET TWICE DAILY 180 tablet 2  . glucosamine-chondroitin 500-400 MG tablet Take 1 tablet by mouth daily as needed (knee flare up or pain).    Marland Kitchen glucose blood test strip Use as instructed to test 1 time daily 100 each 12  . isosorbide mononitrate (IMDUR) 60 MG 24 hr tablet TAKE ONE (1) TABLET BY MOUTH EVERY DAY 90 tablet 3  . JARDIANCE 10 MG TABS tablet TAKE ONE TABLET BY MOUTH EVERY OTHER DAY (Patient taking differently: Take 10 mg by mouth every Monday, Wednesday, and Friday. ) 30 tablet 3  . mesalamine (APRISO) 0.375 g 24 hr capsule Take 4 capsules (1.5 g total) by mouth daily. 120 capsule 11  . metFORMIN (GLUCOPHAGE) 1000 MG tablet TAKE ONE TABLET TWICE DAILY WITH A MEAL (Patient taking differently: Take 1,000 mg by mouth 2 (two) times daily with a meal. TAKE ONE TABLET TWICE DAILY WITH A MEAL) 180 tablet 1  . PRADAXA 150 MG CAPS capsule TAKE ONE (1) CAPSULE BY MOUTH TWICE A DAY. (EVERY 12 HOURS.) (Patient taking differently: Take 150 mg by mouth 2 (two) times daily. ) 60 capsule 8  . sacubitril-valsartan (ENTRESTO) 49-51 MG Take 1 tablet by mouth 2 (two) times daily. 180 tablet 3  . sitaGLIPtin (JANUVIA) 100 MG tablet TAKE ONE (1) TABLET EACH DAY 90  tablet 2  . spironolactone (ALDACTONE) 25 MG tablet Take 1 tablet (25 mg total) by mouth daily. 90 tablet 3  .  SYNTHROID 50 MCG tablet Take 1 tablet (50 mcg total) by mouth daily before breakfast. 90 tablet 3   No current facility-administered medications on file prior to visit.    No Known Allergies Family History  Problem Relation Age of Onset  . Heart attack Mother        mid 56s  . Heart disease Father        H/O CAD, CABG, VALVE SURGERY  . Hypertension Brother   . Obesity Brother   . Stomach cancer Maternal Aunt   . Stomach cancer Paternal Grandmother        ? colon     PE: BP 120/80   Pulse 89   Ht 5' 9"  (1.753 m)   Wt 273 lb 3.2 oz (123.9 kg)   SpO2 96%   BMI 40.34 kg/m  Body mass index is 40.34 kg/m.  Wt Readings from Last 3 Encounters:  06/20/18 273 lb 3.2 oz (123.9 kg)  06/01/18 268 lb (121.6 kg)  05/28/18 276 lb (125.2 kg)   Constitutional: overweight, in NAD Eyes: PERRLA, EOMI, no exophthalmos ENT: moist mucous membranes, no thyromegaly, no cervical lymphadenopathy Cardiovascular: RRR, No MRG, + lower extremity edema-wears compression hoses Respiratory: CTA B Gastrointestinal: abdomen soft, NT, ND, BS+ Musculoskeletal: no deformities, strength intact in all 4 Skin: moist, warm, no rashes Neurological: no tremor with outstretched hands, DTR normal in all 4  ASSESSMENT: 1. DM2, non-insulin-dependent, uncontrolled, with complications - CAD, s/p MI 10/2006 - s/p stent - sees [name]  2. Obesity class 3 BMI Classification:  < 18.5 underweight   18.5-24.9 normal weight   25.0-29.9 overweight   30.0-34.9 class I obesity   35.0-39.9 class II obesity   ? 40.0 class III obesity   3. HL  PLAN:  1. Patient with long-standing type 2 diabetes, with fair control on an oral antidiabetic regimen.  He continues to lose weight but he had some dietary indiscretion since last visit and sugars are slightly higher.  However, they are outdoors or close to target, so other than limiting the amount of sweets that he is eating, I do not feel we need to change his regimen.   He recently started [facility], and I advised him that this may also help with blood sugars. - His measured HbA1c levels are less accurate for him compared to HbA1c levels calculated from fructosamine.  We will check a fructosamine today. - I advised him to Patient Instructions  Please continue: - Glipizide 10 mg 2x a day - Metformin 1000 mg 2x a day - Januvia 100 mg daily    - Jardiance 10 mg in a.m., every other day due to increased urination  Please stop at the lab.  Please return in 4 months with your sugar log.   - continue checking sugars at different times of the day - check 1x a day, rotating checks - advised for yearly eye exams >> he is UTD - We will give him the flu shot today - Return to clinic in 4 mo with sugar log      2. Obesity class 3 -Overall, he lost 7 pounds since last visit -Continue Jardiance, which should also help with weight loss  3. HL - Reviewed latest lipid panel from 02/2018: LDL quite low, as is the total cholesterol Lab Results  Component Value Date   CHOL 74 (  L) 02/07/2018   HDL 45 02/07/2018   LDLCALC 13 02/07/2018   LDLDIRECT 18.0 06/13/2017   TRIG 82 02/07/2018   CHOLHDL 1.6 02/07/2018  - Continues Lipitor and omega-3 fatty acids without side effects   Unfortunately, the fructosamine was canceled by the lab and we called the patient to return for another lab draw.  He did not do this yet.  Philemon Kingdom, MD PhD [facility] Endocrinology

## 2018-06-20 NOTE — Patient Instructions (Signed)
Please continue: - Glipizide 10 mg 2x a day - Metformin 1000 mg 2x a day - Januvia 100 mg daily    - Jardiance 10 mg in a.m., every other day due to increased urination  Please stop at the lab.  Please return in 4 months with your sugar log.

## 2018-06-20 NOTE — Telephone Encounter (Signed)
[place] [facility] is calling in regards to a prescription that was sent in for patient Patient was testing 4 times daily and the instruction that were sent over today stated 1 time daily. They would like clarification     Ph- [phone]

## 2018-06-21 LAB — FRUCTOSAMINE

## 2018-06-21 MED ORDER — GLUCOSE BLOOD VI STRP: ORAL_STRIP | 3 refills | Status: DC

## 2018-06-21 NOTE — Telephone Encounter (Signed)
Sent!

## 2018-06-21 NOTE — Telephone Encounter (Signed)
Per OV it states testing 1x a day, please advise

## 2018-06-21 NOTE — Telephone Encounter (Signed)
OK to send with 4x a day

## 2018-06-28 ENCOUNTER — Encounter: Payer: Self-pay | Admitting: Physician Assistant

## 2018-07-10 NOTE — Progress Notes (Deleted)
Cardiology Office Note:    Date:  07/10/2018   ID:  [name], DOB [birth year], MRN [id number]  PCP:  Marin Olp, MD  Cardiologist:  Sherren Mocha, MD *** Electrophysiologist:  None   Referring MD: Marin Olp, MD   No chief complaint on file. ***  History of Present Illness:    [name] is a 72 y.o. 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  He was recently noted to have worsening LV function on echocardiogram.  His EF was previously 40-45%.  Echocardiogram in August 2019 demonstrated an EF of 20-25%.  His medical regimen was adjusted and he was referred to the heart failure clinic.  Dr. Haroldine Laws discussed his case with [name] and the patient was set up for cardiac catheterization.  R and L Cardiac Catheterization demonstrated patent LM, LAD stent.  There was slow flow in the D1 that was collateralized by the PDA.  There was severe ostial LCx and OM disease that was unchanged from 2017.  The LCx and OM stenoses are not amenable to PCI.  Medical Rx was recommended.    Mr. Bunn ***  Prior CV studies:   The following studies were reviewed today:  R/L Cardiac Catheterization 06/01/18 LM dist 20-30 LAD prox stent patent with 20 ISR; D1 with slow flow (R>L collats) RI ost 50 LCx ost 80 (unchanged from 2017); OM2 95 RCA mid 25 Fick CO 4.73/CI 2.04 PASP 44 LVEDP 16 1.  Widely patent left main, LAD, and left circumflex 2.  Severe ostial circumflex and ostial OM lesions unchanged from the previous cath study in 2017 3.   Slow flow in the first diagonal branch of the LAD, collateralized from the right PDA 4.  Well compensated left and right heart hemodynamics with low cardiac output calculated by Fick, possibly related to the Fick calculation with the patient's morbid obesity Recommendations: Stable coronary anatomy, ongoing medical therapy for heart failure.  Left circumflex/obtuse marginal stenoses not approachable by PCI based on ostial lesion locations.   Echo 04/25/18 Mod to severe LVH along inf wall, mild focal basal septal LVH, EF 25-30, Gr 3 DD, ant AK, inf-lat HK, mild MR, severe LAE, mod to severe RAE, PASP 48, small eff  Echo 09/29/16 EF 40-45, ant-sept and ant-apical HK  AAA [place] 08/2016 Normal abdominal aorta    Past Medical History:  Diagnosis Date  . Atrial fibrillation ([place])    initial diagnoses 2012  . B12 deficiency    per patient previously taking shots  . CORONARY ARTERY DISEASE 04/12/2007   2 stents last in 2006.   Marland Kitchen DIABETES MELLITUS, TYPE II 04/16/2007  . Diverticulosis of colon (without mention of hemorrhage)   . HYPERLIPIDEMIA 04/16/2007   reveal study. not sure of medication  . HYPERTENSION 04/12/2007  . HYPOTHYROIDISM 04/12/2007  . Ischemic cardiomyopathy    cath 35-40%  . MYOCARDIAL INFARCTION, HX OF 04/12/2007  . OBESITY 06/16/2009  . SLEEP APNEA 04/12/2007   CPAP  . Stroke ([place])   . ULCERATIVE COLITIS, LEFT SIDED 11/23/2010   Surgical Hx: The patient  has a past surgical history that includes Coronary  stent placement (2008); Cardiac catheterization (10/2002); Tonsillectomy; Cardiac catheterization (N/A, 10/28/2015); and RIGHT/LEFT HEART CATH AND CORONARY ANGIOGRAPHY (N/A, 06/01/2018).   Current Medications: No outpatient medications have been marked as taking for the 07/11/18 encounter (Appointment) with Richardson Dopp T, PA-C.     Allergies:   Patient has no known allergies.   Social History   Tobacco Use  . Smoking status: Former Smoker    Packs/day: 1.00    Years: 5.00     Pack years: 5.00    Types: Cigarettes    Last attempt to quit: 04/13/1966    Years since quitting: 52.2  . Smokeless tobacco: Never Used  Substance Use Topics  . Alcohol use: No    Alcohol/week: 0.0 standard drinks    Comment: no more beer  . Drug use: No     Family Hx: The patient's family history includes Heart attack in his mother; Heart disease in his father; Hypertension in his brother; Obesity in his brother; Stomach cancer in his maternal aunt and paternal grandmother.  ROS:   Please see the history of present illness.    ROS All other systems reviewed and are negative.   EKGs/Labs/Other Test Reviewed:    EKG:  EKG is *** ordered today.  The ekg ordered today demonstrates ***  Recent Labs: 02/07/2018: ALT 8 05/28/2018: BUN 17; Creatinine, Ser 1.27; Hemoglobin 13.7; NT-Pro BNP 646; Platelets 191; Potassium 4.8; Sodium 139   Recent Lipid Panel Lab Results  Component Value Date/Time   CHOL 74 (L) 02/07/2018 08:59 AM   TRIG 82 02/07/2018 08:59 AM   HDL 45 02/07/2018 08:59 AM   CHOLHDL 1.6 02/07/2018 08:59 AM   CHOLHDL 1.7 10/25/2015 05:18 AM   LDLCALC 13 02/07/2018 08:59 AM   LDLDIRECT 18.0 06/13/2017 03:04 PM    Physical Exam:    VS:  There were no vitals taken for this visit.    Wt Readings from Last 3 Encounters:  06/20/18 273 lb 3.2 oz (123.9 kg)  06/01/18 268 lb (121.6 kg)  05/28/18 276 lb (125.2 kg)     ***Physical Exam  ASSESSMENT & PLAN:    No diagnosis found.***  Dispo:  No follow-ups on file.   Medication Adjustments/Labs and Tests Ordered: Current medicines are reviewed at length with the patient today.  Concerns regarding medicines are outlined above.  Tests Ordered: No orders of the defined types were placed in this encounter.  Medication Changes: No orders of the defined types were placed in this encounter.   Signed, Richardson Dopp, PA-C  07/10/2018 4:49 PM    [facility] [place], [place], [place]   [address] Phone: [phone]; Fax: [phone]

## 2018-07-11 ENCOUNTER — Ambulatory Visit: Payer: Medicare Other | Admitting: Physician Assistant

## 2018-07-11 ENCOUNTER — Other Ambulatory Visit: Payer: Self-pay | Admitting: Internal Medicine

## 2018-07-11 ENCOUNTER — Encounter (HOSPITAL_COMMUNITY): Payer: Self-pay | Admitting: Internal Medicine

## 2018-07-11 ENCOUNTER — Ambulatory Visit (HOSPITAL_COMMUNITY)
Admission: RE | Admit: 2018-07-11 | Discharge: 2018-07-11 | Disposition: A | Discharge status: Home / Self Care | Payer: Medicare Other | Source: Ambulatory Visit | Attending: Internal Medicine | Admitting: Internal Medicine

## 2018-07-11 ENCOUNTER — Other Ambulatory Visit: Payer: Self-pay

## 2018-07-11 VITALS — BP 134/78 | HR 56 | Wt 271.1 lb

## 2018-07-11 DIAGNOSIS — I5042 Chronic combined systolic (congestive) and diastolic (congestive) heart failure: Secondary | ICD-10-CM | POA: Insufficient documentation

## 2018-07-11 DIAGNOSIS — E119 Type 2 diabetes mellitus without complications: Secondary | ICD-10-CM | POA: Insufficient documentation

## 2018-07-11 DIAGNOSIS — Z955 Presence of coronary angioplasty implant and graft: Secondary | ICD-10-CM | POA: Insufficient documentation

## 2018-07-11 DIAGNOSIS — Z79890 Hormone replacement therapy (postmenopausal): Secondary | ICD-10-CM | POA: Insufficient documentation

## 2018-07-11 DIAGNOSIS — I2510 Atherosclerotic heart disease of native coronary artery without angina pectoris: Secondary | ICD-10-CM | POA: Insufficient documentation

## 2018-07-11 DIAGNOSIS — E039 Hypothyroidism, unspecified: Secondary | ICD-10-CM | POA: Insufficient documentation

## 2018-07-11 DIAGNOSIS — G4733 Obstructive sleep apnea (adult) (pediatric): Secondary | ICD-10-CM | POA: Diagnosis not present

## 2018-07-11 DIAGNOSIS — Z8249 Family history of ischemic heart disease and other diseases of the circulatory system: Secondary | ICD-10-CM | POA: Insufficient documentation

## 2018-07-11 DIAGNOSIS — I110 Hypertensive heart disease with heart failure: Secondary | ICD-10-CM | POA: Insufficient documentation

## 2018-07-11 DIAGNOSIS — I255 Ischemic cardiomyopathy: Secondary | ICD-10-CM | POA: Insufficient documentation

## 2018-07-11 DIAGNOSIS — I482 Chronic atrial fibrillation, unspecified: Secondary | ICD-10-CM | POA: Insufficient documentation

## 2018-07-11 DIAGNOSIS — I252 Old myocardial infarction: Secondary | ICD-10-CM | POA: Diagnosis not present

## 2018-07-11 DIAGNOSIS — Z7984 Long term (current) use of oral hypoglycemic drugs: Secondary | ICD-10-CM | POA: Diagnosis not present

## 2018-07-11 DIAGNOSIS — Z7902 Long term (current) use of antithrombotics/antiplatelets: Secondary | ICD-10-CM | POA: Diagnosis not present

## 2018-07-11 DIAGNOSIS — E785 Hyperlipidemia, unspecified: Secondary | ICD-10-CM | POA: Diagnosis not present

## 2018-07-11 DIAGNOSIS — Z7901 Long term (current) use of anticoagulants: Secondary | ICD-10-CM | POA: Diagnosis not present

## 2018-07-11 DIAGNOSIS — I5022 Chronic systolic (congestive) heart failure: Secondary | ICD-10-CM | POA: Diagnosis not present

## 2018-07-11 DIAGNOSIS — Z87891 Personal history of nicotine dependence: Secondary | ICD-10-CM | POA: Diagnosis not present

## 2018-07-11 DIAGNOSIS — Z8673 Personal history of transient ischemic attack (TIA), and cerebral infarction without residual deficits: Secondary | ICD-10-CM | POA: Insufficient documentation

## 2018-07-11 DIAGNOSIS — Z79899 Other long term (current) drug therapy: Secondary | ICD-10-CM | POA: Diagnosis not present

## 2018-07-11 DIAGNOSIS — Z6841 Body Mass Index (BMI) 40.0 and over, adult: Secondary | ICD-10-CM | POA: Insufficient documentation

## 2018-07-11 LAB — BASIC METABOLIC PANEL
Anion gap: 13 (ref 5–15)
BUN: 15 mg/dL (ref 8–23)
CALCIUM: 9.2 mg/dL (ref 8.9–10.3)
CO2: 24 mmol/L (ref 22–32)
CREATININE: 1.1 mg/dL (ref 0.61–1.24)
Chloride: 100 mmol/L (ref 98–111)
GFR calc Af Amer: >60 mL/min (ref >60)
GLUCOSE: 190 mg/dL — AB (ref 70–99)
Potassium: 4.7 mmol/L (ref 3.5–5.1)
SODIUM: 137 mmol/L (ref 135–145)

## 2018-07-11 LAB — BRAIN NATRIURETIC PEPTIDE: B Natriuretic Peptide: 174.5 pg/mL — ABNORMAL HIGH (ref 0.0–100.0)

## 2018-07-11 MED ORDER — SACUBITRIL-VALSARTAN 97-103 MG PO TABS: 1.0000 | ORAL_TABLET | Freq: Two times a day (BID) | ORAL | 6 refills | Status: DC

## 2018-07-11 NOTE — Addendum Note (Signed)
Encounter addended by: Scarlette Calico, RN on: 07/11/2018 10:23 AM  Actions taken: Pharmacy for encounter modified, Order list changed, Diagnosis association updated, Sign clinical note

## 2018-07-11 NOTE — Progress Notes (Signed)
Advanced Heart Failure Clinic Note   Referring Physician: [name] PCP: Marin Olp, MD PCP-Cardiologist: Sherren Mocha, MD   HPI:  [name] is a 72 y.o. male with h/o CAD s/p DES to LAD 2004, STEMI 2008 2/2 very late stent thombosis, Chronic Afib, Chronic combined CHF, OSA on CPAP, morbid obesity, HTN, HLD, and Type 2 DM. Referred by Ermalinda Barrios PA for assistance with management of HF.  Saw [name] 04/09/18 with worsening dyspnea. Spiro increased. BNP elevated. Echo repeated which showed worsening LVEF as below. Lasix increased as well. Saw [name] on 05/09/18 and ARB switched to [facility],   We saw hin for the first time in the HF Clinic in 8/19.   Cath 06/01/18  With stable CAD.  RA 9 RV 35/10 PA 44/14 (25) Ao 199/76 (93) LV 124/16 Fick 4.7/2.0    Now back in CR (has been going for 7 years). Feels much better. Doing TM, Nu-step and exercise bike. No undue SOB. No CP. BP well controlled. Weight in CR 271. Tolerating Entresto well. No orthopnea or PND. Takes extra lasix about once a month. Wears CPAP every night.   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 Former smoker (quit 1967). No drug use. Lives with wife.   LHC 2017 - Patent stent in LAD, severe ostial stenosis of LCx not amenable to PCI, patent, non-dominant RCA supplying collaterals to the diagonal branch to the LAD.   Echo 09/29/2016 LVEF 40-45%, trivial MR, Mod LAE,  Echo 04/25/2018 LVEF 25-30%, Grade 3 DD, Mild MR, Severe LAE, Mod RV dilation, PA peak pressure 48 mm Hg. WMA noted with Akinesis of the mid-apical anterior, mid anteroseptal, apical lateral, and apical myocardium; hypokinesis of the mid myocardium; moderate hypokinesis of the basal anteroseptal, basal-mid inferoseptal, mid-apical inferior, and apical septal myocardium.   Past Medical History:  Diagnosis Date  . Atrial fibrillation ([place])    initial diagnoses 2012  . B12 deficiency    per patient previously taking shots  .  CORONARY ARTERY DISEASE 04/12/2007   2 stents last in 2006.   Marland Kitchen DIABETES MELLITUS, TYPE II 04/16/2007  . Diverticulosis of colon (without mention of hemorrhage)   . HYPERLIPIDEMIA 04/16/2007   reveal study. not sure of medication  . HYPERTENSION 04/12/2007  . HYPOTHYROIDISM 04/12/2007  . Ischemic cardiomyopathy    cath 35-40%  . MYOCARDIAL INFARCTION, HX OF 04/12/2007  . OBESITY 06/16/2009  . SLEEP APNEA 04/12/2007   CPAP  . Stroke ([place])   . ULCERATIVE COLITIS, LEFT SIDED 11/23/2010    Current Outpatient Medications  Medication Sig Dispense Refill  . atorvastatin (LIPITOR) 20 MG tablet Take 1 tablet (20 mg total) by mouth daily. 90 tablet 3  . carvedilol (COREG) 25 MG tablet TAKE ONE TABLET TWICE DAILY 180 tablet 2  . clopidogrel (PLAVIX) 75 MG tablet TAKE ONE (1) TABLET EACH DAY 90 tablet 1  . fish oil-omega-3 fatty acids 1000 MG capsule Take 2 g by mouth daily.     . furosemide (LASIX) 40 MG tablet Take 1 tablet (40 mg total) by mouth daily. Take extra tablet once daily in the PM as needed for weight gain 3 lbs in 24 hrs or swelling. 30 tablet 11  . glipiZIDE (GLUCOTROL) 10 MG tablet TAKE ONE TABLET BY MOUTH TWICE A DAY 180 tablet 2  . glucosamine-chondroitin 500-400 MG tablet Take 1 tablet by mouth daily as needed (knee flare up or pain).    Marland Kitchen glucose blood test strip Use  as instructed to test 4 time daily 400 each 3  . isosorbide mononitrate (IMDUR) 60 MG 24 hr tablet TAKE ONE (1) TABLET BY MOUTH EVERY DAY 90 tablet 3  . JARDIANCE 10 MG TABS tablet TAKE ONE TABLET BY MOUTH EVERY OTHER DAY 30 tablet 3  . mesalamine (APRISO) 0.375 g 24 hr capsule Take 4 capsules (1.5 g total) by mouth daily. 120 capsule 11  . metFORMIN (GLUCOPHAGE) 1000 MG tablet TAKE ONE TABLET TWICE DAILY WITH A MEAL 180 tablet 1  . PRADAXA 150 MG CAPS capsule TAKE ONE (1) CAPSULE BY MOUTH TWICE A DAY. (EVERY 12 HOURS.) 60 capsule 8  . sacubitril-valsartan (ENTRESTO) 49-51 MG Take 1 tablet by mouth 2 (two) times daily. 180 tablet  3  . sitaGLIPtin (JANUVIA) 100 MG tablet TAKE ONE (1) TABLET EACH DAY 90 tablet 2  . spironolactone (ALDACTONE) 25 MG tablet Take 1 tablet (25 mg total) by mouth daily. 90 tablet 3  . SYNTHROID 50 MCG tablet Take 1 tablet (50 mcg total) by mouth daily before breakfast. 90 tablet 3   No current facility-administered medications for this encounter.     No Known Allergies    Social History   Socioeconomic History  . Marital status: Married    Spouse name: Not on file  . Number of children: 1  . Years of education: Not on file  . Highest education level: Not on file  Occupational History  . Occupation: RETIRED    Employer: RETIRED  Social Needs  . Financial resource strain: Not on file  . Food insecurity:    Worry: Not on file    Inability: Not on file  . Transportation needs:    Medical: Not on file    Non-medical: Not on file  Tobacco Use  . Smoking status: Former Smoker    Packs/day: 1.00    Years: 5.00    Pack years: 5.00    Types: Cigarettes    Last attempt to quit: 04/13/1966    Years since quitting: 52.2  . Smokeless tobacco: Never Used  Substance and Sexual Activity  . Alcohol use: No    Alcohol/week: 0.0 standard drinks    Comment: no more beer  . Drug use: No  . Sexual activity: Not on file  Lifestyle  . Physical activity:    Days per week: Not on file    Minutes per session: Not on file  . Stress: Not on file  Relationships  . Social connections:    Talks on phone: Not on file    Gets together: Not on file    Attends religious service: Not on file    Active member of club or organization: Not on file    Attends meetings of clubs or organizations: Not on file    Relationship status: Not on file  . Intimate partner violence:    Fear of current or ex partner: Not on file    Emotionally abused: Not on file    Physically abused: Not on file    Forced sexual activity: Not on file  Other Topics Concern  . Not on file  Social History Narrative    Cardiorehab 3 days a week. 45 minutes to an hour-stationary bike.    GRANDDAUGHTER (MS. 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       Married for 37 years in 2015,  married previously for 14 years. Daughter with first wife and 3 grandkids.    Lives alone with wife. Get to see grandkids a lot. New grandchild in middle of 32      Hobbies-yardwork, previously liked to hunt and fish, does some target shooting      Family History  Problem Relation Age of Onset  . Heart attack Mother        mid 32s  . Heart disease Father        H/O CAD, CABG, VALVE SURGERY  . Hypertension Brother   . Obesity Brother   . Stomach cancer Maternal Aunt   . Stomach cancer Paternal Grandmother        ? colon     Vitals:   07/11/18 0950  BP: 134/78  Pulse: (!) 56  SpO2: 96%  Weight: 123 kg (271 lb 2 oz)    Wt Readings from Last 3 Encounters:  07/11/18 123 kg (271 lb 2 oz)  06/20/18 123.9 kg (273 lb 3.2 oz)  06/01/18 121.6 kg (268 lb)     PHYSICAL EXAM: General:  Well appearing. No resp difficulty HEENT: normal Neck: supple. no JVD. Carotids 2+ bilat; no bruits. No lymphadenopathy or thryomegaly appreciated. Cor: PMI nondisplaced. Irregular rate & rhythm. No rubs, gallops or murmurs. Lungs: clear Abdomen: obese soft, nontender, nondistended. No hepatosplenomegaly. No bruits or masses. Good bowel sounds. Extremities: no cyanosis, clubbing, rash, edema Neuro: alert & orientedx3, cranial nerves grossly intact. moves all 4 extremities w/o difficulty. Affect pleasant    ECG AF 91 with iVCD. Nonspecific ST-T abnormalities. Personally reviewed   ASSESSMENT & PLAN:  1. Chronic combined CHF, ICM - Echo 04/25/2018 LVEF 25-30%, Grade 3 DD, Mild MR, Severe LAE, Mod RV dilation, PA peak pressure 48 mm Hg. WMA noted - Doing very well. NYHA II (improved with diuresis) - Volume status stable on exam -  Increase Entresto to 97/103 mg BID can stop lasix as needed - Continue coreg 25 mg BID - Continue spironolactone 25 mg daily (recent increase). K 4.9 7/31. Taking 20 meq K daily. Stop supp. - Pt is on Jardiance.  - Cath results reviewed with him - Repeat echo in 2 months. If EF <= 35% needs ICD - Check labs today 2. CAD - LHC 2017 with circumflex stenosis unsuitable for PCI, continued patency of the LAD stent and total occlusion of the first diagonal with collaterals from RCA, widely patent RCA - Cath 8/19 with stable anatomy  - No s/s of ischemia   - Continue statin and plavix. Not on ASA with Pradaxa.  3. Chronic Atrial Fibrillation - Chronic. Rate controlled.  - On Pradaxa for AC. Denies bleeding.  - This patients CHA2DS2-VASc is at least 5. 4. Essential HTN - Blood pressure well controlled. Continue current regimen. 5. OSA on CPAP - Wears every night. 6. Morbid Obesity - Body mass index is 40.04 kg/m.  - Needs weight loss 7. DM2 - Per PCP - Pt is on jardiance.   Glori Bickers, MD 07/11/18

## 2018-07-11 NOTE — Patient Instructions (Signed)
Increase Entresto to 97/103 mg Twice daily   Labs today  Your physician recommends that you schedule a follow-up appointment in: 2 months with echocardiogram

## 2018-07-30 ENCOUNTER — Ambulatory Visit (HOSPITAL_COMMUNITY)
Admission: RE | Admit: 2018-07-30 | Discharge: 2018-07-30 | Disposition: A | Discharge status: Home / Self Care | Payer: Medicare Other | Source: Ambulatory Visit | Attending: Cardiology | Admitting: Cardiology

## 2018-07-30 ENCOUNTER — Other Ambulatory Visit: Payer: Self-pay | Admitting: Family Medicine

## 2018-07-30 DIAGNOSIS — Z87891 Personal history of nicotine dependence: Secondary | ICD-10-CM

## 2018-07-30 DIAGNOSIS — I10 Essential (primary) hypertension: Secondary | ICD-10-CM

## 2018-08-14 ENCOUNTER — Ambulatory Visit: Payer: Medicare Other | Admitting: Family Medicine

## 2018-08-15 ENCOUNTER — Other Ambulatory Visit: Payer: Self-pay | Admitting: Family Medicine

## 2018-09-05 ENCOUNTER — Ambulatory Visit (INDEPENDENT_AMBULATORY_CARE_PROVIDER_SITE_OTHER): Payer: Medicare Other | Admitting: Podiatry

## 2018-09-05 ENCOUNTER — Encounter: Payer: Self-pay | Admitting: Podiatry

## 2018-09-05 DIAGNOSIS — M79674 Pain in right toe(s): Secondary | ICD-10-CM

## 2018-09-05 DIAGNOSIS — B351 Tinea unguium: Secondary | ICD-10-CM | POA: Diagnosis not present

## 2018-09-05 DIAGNOSIS — M79675 Pain in left toe(s): Secondary | ICD-10-CM

## 2018-09-05 DIAGNOSIS — E1151 Type 2 diabetes mellitus with diabetic peripheral angiopathy without gangrene: Secondary | ICD-10-CM | POA: Diagnosis not present

## 2018-09-05 NOTE — Patient Instructions (Addendum)
Onychomycosis/Fungal Toenails  WHAT IS IT? An infection that lies within the keratin of your nail plate that is caused by a fungus.  WHY ME? Fungal infections affect all ages, sexes, races, and creeds.  There may be many factors that predispose you to a fungal infection such as age, coexisting medical conditions such as diabetes, or an autoimmune disease; stress, medications, fatigue, genetics, etc.  Bottom line: fungus thrives in a warm, moist environment and your shoes offer such a location.  IS IT CONTAGIOUS? Theoretically, yes.  You do not want to share shoes, nail clippers or files with someone who has fungal toenails.  Walking around barefoot in the same room or sleeping in the same bed is unlikely to transfer the organism.  It is important to realize, however, that fungus can spread easily from one nail to the next on the same foot.  HOW DO WE TREAT THIS?  There are several ways to treat this condition.  Treatment may depend on many factors such as age, medications, pregnancy, liver and kidney conditions, etc.  It is best to ask your doctor which options are available to you.  1. No treatment.   Unlike many other medical concerns, you can live with this condition.  However for many people this can be a painful condition and may lead to ingrown toenails or a bacterial infection.  It is recommended that you keep the nails cut short to help reduce the amount of fungal nail. 2. Topical treatment.  These range from herbal remedies to prescription strength nail lacquers.  About 40-50% effective, topicals require twice daily application for approximately 9 to 12 months or until an entirely new nail has grown out.  The most effective topicals are medical grade medications available through physicians offices. 3. Oral antifungal medications.  With an 80-90% cure rate, the most common oral medication requires 3 to 4 months of therapy and stays in your system for a year as the new nail grows out.  Oral  antifungal medications do require blood work to make sure it is a safe drug for you.  A liver function panel will be performed prior to starting the medication and after the first month of treatment.  It is important to have the blood work performed to avoid any harmful side effects.  In general, this medication safe but blood work is required. 4. Laser Therapy.  This treatment is performed by applying a specialized laser to the affected nail plate.  This therapy is noninvasive, fast, and non-painful.  It is not covered by insurance and is therefore, out of pocket.  The results have been very good with a 80-95% cure rate.  The [facility] is the only practice in the area to offer this therapy. 5. Permanent Nail Avulsion.  Removing the entire nail so that a new nail will not grow back.  Diabetes and Foot Care Diabetes may cause you to have problems because of poor blood supply (circulation) to your feet and legs. This may cause the skin on your feet to become thinner, break easier, and heal more slowly. Your skin may become dry, and the skin may peel and crack. You may also have nerve damage in your legs and feet causing decreased feeling in them. You may not notice minor injuries to your feet that could lead to infections or more serious problems. Taking care of your feet is one of the most important things you can do for yourself. Follow these instructions at home:  Wear  shoes at all times, even in the house. Do not go barefoot. Bare feet are easily injured.  Check your feet daily for blisters, cuts, and redness. If you cannot see the bottom of your feet, use a mirror or ask someone for help.  Wash your feet with warm water (do not use hot water) and mild soap. Then pat your feet and the areas between your toes until they are completely dry. Do not soak your feet as this can dry your skin.  Apply a moisturizing lotion or petroleum jelly (that does not contain alcohol and is unscented) to the  skin on your feet and to dry, brittle toenails. Do not apply lotion between your toes.  Trim your toenails straight across. Do not dig under them or around the cuticle. File the edges of your nails with an emery board or nail file.  Do not cut corns or calluses or try to remove them with medicine.  Wear clean socks or stockings every day. Make sure they are not too tight. Do not wear knee-high stockings since they may decrease blood flow to your legs.  Wear shoes that fit properly and have enough cushioning. To break in new shoes, wear them for just a few hours a day. This prevents you from injuring your feet. Always look in your shoes before you put them on to be sure there are no objects inside.  Do not cross your legs. This may decrease the blood flow to your feet.  If you find a minor scrape, cut, or break in the skin on your feet, keep it and the skin around it clean and dry. These areas may be cleansed with mild soap and water. Do not cleanse the area with peroxide, alcohol, or iodine.  When you remove an adhesive bandage, be sure not to damage the skin around it.  If you have a wound, look at it several times a day to make sure it is healing.  Do not use heating pads or hot water bottles. They may burn your skin. If you have lost feeling in your feet or legs, you may not know it is happening until it is too late.  Make sure your health care provider performs a complete foot exam at least annually or more often if you have foot problems. Report any cuts, sores, or bruises to your health care provider immediately. Contact a health care provider if:  You have an injury that is not healing.  You have cuts or breaks in the skin.  You have an ingrown nail.  You notice redness on your legs or feet.  You feel burning or tingling in your legs or feet.  You have pain or cramps in your legs and feet.  Your legs or feet are numb.  Your feet always feel cold. Get help right away  if:  There is increasing redness, swelling, or pain in or around a wound.  There is a red line that goes up your leg.  Pus is coming from a wound.  You develop a fever or as directed by your health care provider.  You notice a bad smell coming from an ulcer or wound. This information is not intended to replace advice given to you by your health care provider. Make sure you discuss any questions you have with your health care provider. Document Released: 09/23/2000 Document Revised: 03/03/2016 Document Reviewed: 03/05/2013 Elsevier Interactive Patient Education  2017 [employer].

## 2018-09-05 NOTE — Progress Notes (Signed)
Subjective: [name] presents today with painful, thick toenails 1-5 b/l that he cannot cut and which interfere with daily activities.  Pain is aggravated when wearing enclosed shoe gear.  Patient has history of diabetes with peripheral vascular disease and diabetic neuropathy.  He is also on long-term blood thinner Plavix.  His wife is present during the visit.  They voice no new pedal concerns on today's visit.  Objective: Vascular Examination: Capillary refill time less than 3 seconds to all 10 digits.   Dorsalis pedis pulses faintly palpable bilaterally  Posterior tibial pulses nonpalpable bilaterally Sparse digital hair noted bilaterally Skin temperature gradient warm to cool bilaterally  Dermatological Examination: Skin noted to be thinning atrophic bilaterally  Toenails 1-5 b/l discolored, thick, dystrophic with subungual debris and pain with palpation to nailbeds due to thickness of nails.  Musculoskeletal: Muscle strength 5/5 to all LE muscle groups  Neurological: Sensation diminished with 10 g monofilament bilaterally.  Vibratory sensation diminished bilaterally.  Assessment: Painful onychomycosis toenails 1-5 b/l  Diabetes with PAD Diabetic neuropathy  Plan: 1. Toenails 1-5 b/l were debrided in length and girth without iatrogenic bleeding. 2. Patient to continue soft, supportive shoe gear 3. Patient to report any pedal injuries to medical professional immediately. 4. Follow up 3 months. Patient/POA to call should there be a concern in the interim.

## 2018-09-12 ENCOUNTER — Ambulatory Visit (HOSPITAL_BASED_OUTPATIENT_CLINIC_OR_DEPARTMENT_OTHER)
Admission: RE | Admit: 2018-09-12 | Discharge: 2018-09-12 | Disposition: A | Discharge status: Home / Self Care | Payer: Medicare Other | Source: Ambulatory Visit | Attending: Internal Medicine | Admitting: Internal Medicine

## 2018-09-12 ENCOUNTER — Ambulatory Visit (HOSPITAL_COMMUNITY)
Admission: RE | Admit: 2018-09-12 | Discharge: 2018-09-12 | Disposition: A | Discharge status: Home / Self Care | Payer: Medicare Other | Source: Ambulatory Visit | Attending: Family Medicine | Admitting: Family Medicine

## 2018-09-12 VITALS — BP 142/80 | HR 86 | Wt 282.4 lb

## 2018-09-12 DIAGNOSIS — Z955 Presence of coronary angioplasty implant and graft: Secondary | ICD-10-CM | POA: Insufficient documentation

## 2018-09-12 DIAGNOSIS — I5042 Chronic combined systolic (congestive) and diastolic (congestive) heart failure: Secondary | ICD-10-CM | POA: Insufficient documentation

## 2018-09-12 DIAGNOSIS — I2510 Atherosclerotic heart disease of native coronary artery without angina pectoris: Secondary | ICD-10-CM | POA: Diagnosis not present

## 2018-09-12 DIAGNOSIS — Z7902 Long term (current) use of antithrombotics/antiplatelets: Secondary | ICD-10-CM | POA: Diagnosis not present

## 2018-09-12 DIAGNOSIS — I10 Essential (primary) hypertension: Secondary | ICD-10-CM | POA: Diagnosis not present

## 2018-09-12 DIAGNOSIS — Z7901 Long term (current) use of anticoagulants: Secondary | ICD-10-CM | POA: Insufficient documentation

## 2018-09-12 DIAGNOSIS — I482 Chronic atrial fibrillation, unspecified: Secondary | ICD-10-CM | POA: Insufficient documentation

## 2018-09-12 DIAGNOSIS — I4819 Other persistent atrial fibrillation: Secondary | ICD-10-CM

## 2018-09-12 DIAGNOSIS — I110 Hypertensive heart disease with heart failure: Secondary | ICD-10-CM | POA: Insufficient documentation

## 2018-09-12 DIAGNOSIS — Z7984 Long term (current) use of oral hypoglycemic drugs: Secondary | ICD-10-CM | POA: Insufficient documentation

## 2018-09-12 DIAGNOSIS — I5022 Chronic systolic (congestive) heart failure: Secondary | ICD-10-CM | POA: Diagnosis not present

## 2018-09-12 DIAGNOSIS — I255 Ischemic cardiomyopathy: Secondary | ICD-10-CM | POA: Diagnosis not present

## 2018-09-12 DIAGNOSIS — G4733 Obstructive sleep apnea (adult) (pediatric): Secondary | ICD-10-CM | POA: Diagnosis not present

## 2018-09-12 DIAGNOSIS — Z79899 Other long term (current) drug therapy: Secondary | ICD-10-CM | POA: Diagnosis not present

## 2018-09-12 DIAGNOSIS — Z6841 Body Mass Index (BMI) 40.0 and over, adult: Secondary | ICD-10-CM | POA: Insufficient documentation

## 2018-09-12 DIAGNOSIS — E039 Hypothyroidism, unspecified: Secondary | ICD-10-CM | POA: Diagnosis not present

## 2018-09-12 DIAGNOSIS — Z8673 Personal history of transient ischemic attack (TIA), and cerebral infarction without residual deficits: Secondary | ICD-10-CM | POA: Diagnosis not present

## 2018-09-12 DIAGNOSIS — E1151 Type 2 diabetes mellitus with diabetic peripheral angiopathy without gangrene: Secondary | ICD-10-CM | POA: Diagnosis not present

## 2018-09-12 DIAGNOSIS — E119 Type 2 diabetes mellitus without complications: Secondary | ICD-10-CM | POA: Diagnosis not present

## 2018-09-12 DIAGNOSIS — E785 Hyperlipidemia, unspecified: Secondary | ICD-10-CM | POA: Insufficient documentation

## 2018-09-12 DIAGNOSIS — Z87891 Personal history of nicotine dependence: Secondary | ICD-10-CM | POA: Insufficient documentation

## 2018-09-12 DIAGNOSIS — I252 Old myocardial infarction: Secondary | ICD-10-CM | POA: Insufficient documentation

## 2018-09-12 LAB — BASIC METABOLIC PANEL
Anion gap: 10 (ref 5–15)
BUN: 12 mg/dL (ref 8–23)
CALCIUM: 9.2 mg/dL (ref 8.9–10.3)
CO2: 24 mmol/L (ref 22–32)
CREATININE: 1.07 mg/dL (ref 0.61–1.24)
Chloride: 105 mmol/L (ref 98–111)
GFR calc Af Amer: >60 mL/min (ref >60)
GFR calc non Af Amer: >60 mL/min (ref >60)
Glucose, Bld: 152 mg/dL — ABNORMAL HIGH (ref 70–99)
Potassium: 4.4 mmol/L (ref 3.5–5.1)
Sodium: 139 mmol/L (ref 135–145)

## 2018-09-12 NOTE — Addendum Note (Signed)
Encounter addended by: Harvie Junior, CMA on: 09/12/2018 11:01 AM  Actions taken: Order list changed, Diagnosis association updated, Sign clinical note

## 2018-09-12 NOTE — Progress Notes (Signed)
  Echocardiogram 2D Echocardiogram has been performed.  [name] 09/12/2018, 10:01 AM

## 2018-09-12 NOTE — Patient Instructions (Signed)
Routine lab work today. Will notify you of abnormal results  You have been referred to Electrophysiology with Dr.Allred (his office will contact you to schedule appointment)  Please follow the [facility].  Please call our office at [phone] in May to schedule your June 2020 appointment.

## 2018-09-12 NOTE — Progress Notes (Signed)
Advanced Heart Failure Clinic Note   Referring Physician: [name] PCP: Marin Olp, MD PCP-Cardiologist: Sherren Mocha, MD   HPI:  [name] is a 72 y.o. male with h/o CAD s/p DES to LAD 2004, STEMI 2008 2/2 very late stent thombosis, Chronic Afib, Chronic combined CHF, OSA on CPAP, morbid obesity, HTN, HLD, and Type 2 DM. Referred by Ermalinda Barrios PA for assistance with management of HF.  Saw [name] 04/09/18 with worsening dyspnea. Spiro increased. BNP elevated. Echo repeated which showed worsening LVEF as below. Lasix increased as well. Saw [name] on 05/09/18 and ARB switched to [facility],   We saw him for the first time in the HF Clinic in 8/19.   Cath 06/01/18  With stable CAD.  RA 9 RV 35/10 PA 44/14 (25) Ao 199/76 (93) LV 124/16 Fick 4.7/2.0    Now back in [place] (has been going for 7 years).   He presents today for regular follow up. Feeling OK overall. Doing the CR maintenance program twice weekly, long-term. Doesn't have to stop in a grocery store, but does get SOB. Uses the cart like a walker. No orthopnea, but sleeps on a wedge and CPAP. Uses CPAP every night. Takes extra lasix on occasion, but hasn't in sometime. BP usually runs 110-120s in CR.   Echo today appears EF ~ 25-30%  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 Former smoker (quit 1967). No drug use. Lives with wife.   LHC 2017 - Patent stent in LAD, severe ostial stenosis of LCx not amenable to PCI, patent, non-dominant RCA supplying collaterals to the diagonal branch to the LAD.   Echo 09/29/2016 LVEF 40-45%, trivial MR, Mod LAE,  Echo 04/25/2018 LVEF 25-30%, Grade 3 DD, Mild MR, Severe LAE, Mod RV dilation, PA peak pressure 48 mm Hg. WMA noted with Akinesis of the mid-apical anterior, mid anteroseptal, apical lateral, and apical myocardium; hypokinesis of the mid myocardium; moderate hypokinesis of the basal anteroseptal, basal-mid inferoseptal, mid-apical inferior, and apical septal  myocardium.   Past Medical History:  Diagnosis Date  . Atrial fibrillation ([place])    initial diagnoses 2012  . B12 deficiency    per patient previously taking shots  . CORONARY ARTERY DISEASE 04/12/2007   2 stents last in 2006.   Marland Kitchen DIABETES MELLITUS, TYPE II 04/16/2007  . Diverticulosis of colon (without mention of hemorrhage)   . HYPERLIPIDEMIA 04/16/2007   reveal study. not sure of medication  . HYPERTENSION 04/12/2007  . HYPOTHYROIDISM 04/12/2007  . Ischemic cardiomyopathy    cath 35-40%  . MYOCARDIAL INFARCTION, HX OF 04/12/2007  . OBESITY 06/16/2009  . SLEEP APNEA 04/12/2007   CPAP  . Stroke ([place])   . ULCERATIVE COLITIS, LEFT SIDED 11/23/2010    Current Outpatient Medications  Medication Sig Dispense Refill  . atorvastatin (LIPITOR) 20 MG tablet Take 1 tablet (20 mg total) by mouth daily. 90 tablet 3  . carvedilol (COREG) 25 MG tablet TAKE ONE TABLET TWICE DAILY 180 tablet 2  . clopidogrel (PLAVIX) 75 MG tablet TAKE ONE (1) TABLET BY MOUTH EVERY DAY 90 tablet 1  . fish oil-omega-3 fatty acids 1000 MG capsule Take 2 g by mouth daily.     . furosemide (LASIX) 40 MG tablet Take 1 tablet (40 mg total) by mouth daily. Take extra tablet once daily in the PM as needed for weight gain 3 lbs in 24 hrs or swelling. 30 tablet 11  . glipiZIDE (GLUCOTROL) 10 MG tablet TAKE ONE TABLET  BY MOUTH TWICE A DAY 180 tablet 2  . isosorbide mononitrate (IMDUR) 60 MG 24 hr tablet TAKE ONE (1) TABLET BY MOUTH EVERY DAY 90 tablet 3  . JARDIANCE 10 MG TABS tablet TAKE ONE TABLET BY MOUTH EVERY OTHER DAY 30 tablet 3  . mesalamine (APRISO) 0.375 g 24 hr capsule Take 4 capsules (1.5 g total) by mouth daily. 120 capsule 11  . metFORMIN (GLUCOPHAGE) 1000 MG tablet TAKE ONE TABLET BY MOUTH TWICE A DAY WITH A MEAL 180 tablet 1  . PRADAXA 150 MG CAPS capsule TAKE ONE (1) CAPSULE BY MOUTH TWICE A DAY. (EVERY 12 HOURS.) 60 capsule 8  . sacubitril-valsartan (ENTRESTO) 97-103 MG Take 1 tablet by mouth 2 (two) times daily. 60  tablet 6  . sitaGLIPtin (JANUVIA) 100 MG tablet TAKE ONE (1) TABLET EACH DAY 90 tablet 2  . spironolactone (ALDACTONE) 25 MG tablet Take 1 tablet (25 mg total) by mouth daily. 90 tablet 3  . SYNTHROID 50 MCG tablet Take 1 tablet (50 mcg total) by mouth daily before breakfast. 90 tablet 3  . glucosamine-chondroitin 500-400 MG tablet Take 1 tablet by mouth daily as needed (knee flare up or pain).    Marland Kitchen glucose blood test strip Use as instructed to test 4 time daily 400 each 3   No current facility-administered medications for this encounter.     No Known Allergies    Social History   Socioeconomic History  . Marital status: Married    Spouse name: Not on file  . Number of children: 1  . Years of education: Not on file  . Highest education level: Not on file  Occupational History  . Occupation: RETIRED    Employer: RETIRED  Social Needs  . Financial resource strain: Not on file  . Food insecurity:    Worry: Not on file    Inability: Not on file  . Transportation needs:    Medical: Not on file    Non-medical: Not on file  Tobacco Use  . Smoking status: Former Smoker    Packs/day: 1.00    Years: 5.00    Pack years: 5.00    Types: Cigarettes    Last attempt to quit: 04/13/1966    Years since quitting: 52.4  . Smokeless tobacco: Never Used  Substance and Sexual Activity  . Alcohol use: No    Alcohol/week: 0.0 standard drinks    Comment: no more beer  . Drug use: No  . Sexual activity: Not on file  Lifestyle  . Physical activity:    Days per week: Not on file    Minutes per session: Not on file  . Stress: Not on file  Relationships  . Social connections:    Talks on phone: Not on file    Gets together: Not on file    Attends religious service: Not on file    Active member of club or organization: Not on file    Attends meetings of clubs or organizations: Not on file    Relationship status: Not on file  . Intimate partner violence:    Fear of current or ex partner:  Not on file    Emotionally abused: Not on file    Physically abused: Not on file    Forced sexual activity: Not on file  Other Topics Concern  . Not on file  Social History Narrative   Cardiorehab 3 days a week. 45 minutes to an hour-stationary bike.    GRANDDAUGHTER (MS. 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       Married for 37 years in 2015, married previously for 14 years. Daughter with first wife and 3 grandkids.    Lives alone with wife. Get to see grandkids a lot. New grandchild in middle of 91      Hobbies-yardwork, previously liked to hunt and fish, does some target shooting      Family History  Problem Relation Age of Onset  . Heart attack Mother        mid 2s  . Heart disease Father        H/O CAD, CABG, VALVE SURGERY  . Hypertension Brother   . Obesity Brother   . Stomach cancer Maternal Aunt   . Stomach cancer Paternal Grandmother        ? colon     Vitals:   09/12/18 1022  BP: (!) 142/80  Pulse: 86  SpO2: 98%  Weight: 128.1 kg (282 lb 6.4 oz)    Wt Readings from Last 3 Encounters:  09/12/18 128.1 kg (282 lb 6.4 oz)  07/11/18 123 kg (271 lb 2 oz)  06/20/18 123.9 kg (273 lb 3.2 oz)     PHYSICAL EXAM: General: Well appearing. No resp difficulty. HEENT: Normal. Anicteric  Neck: Supple. JVP 6-7 cm. Carotids 2+ bilat; no bruits. No thyromegaly or nodule noted. Cor: PMI nondisplaced. RRR, No M/G/R noted Lungs: CTAB, normal effort. Abdomen: Obese, soft, non-tender, non-distended, no HSM. No bruits or masses. +BS  Extremities: No cyanosis, clubbing, or rash. Trace ankle edema.  Neuro: Alert & orientedx3, cranial nerves grossly intact. moves all 4 extremities w/o difficulty. Affect pleasant   ASSESSMENT & PLAN:  1. Chronic combined CHF, ICM - Echo 04/25/2018 LVEF 25-30%, Grade 3 DD, Mild MR, Severe LAE, Mod RV dilation, PA peak pressure 48 mm Hg.  WMA noted - Echo today with persistently depressed EF at 25-30%. Recommend EP referral for ICD consideration.  - NYHA II symptoms - Volume status stable on exam - Continue Entresto 97/103 mg BID  - Continue coreg 25 mg BID - Continue spironolactone 25 mg daily (recent increase).  - Pt is on Jardiance.  - Cath results reviewed with him - BMET Today.  2. CAD - LHC 2017 with circumflex stenosis unsuitable for PCI, continued patency of the LAD stent and total occlusion of the first diagonal with collaterals from RCA, widely patent RCA - Cath 8/19 with stable anatomy  - No s/s of ischemia.    - Continue statin and plavix. Not on ASA with Pradaxa.  3. Chronic Atrial Fibrillation - Chronic, rate controlled. - On Pradaxa for AC. Denies bleeding.  - This patients CHA2DS2-VASc is at least 5. 4. Essential HTN  - Meds as above.  5. OSA on CPAP - Continue nightly use.  6. Morbid Obesity - Body mass index is 41.7 kg/m.  - Encouraged weight loss.  7. DM2 - Per PCP.  - Pt is on jardiance.   Shirley Friar, PA-C 09/12/18   Patient seen and examined with the above-signed Advanced Practice Provider and/or Housestaff. I personally reviewed laboratory data, imaging studies and relevant notes. I independently examined the patient and formulated the important aspects of the plan. I have edited the note to reflect any of my changes or salient points. I have personally discussed the plan with the patient and/or family.  Overall doing  well. NYHA II. Volume status ok. On very good meds. Echo today EF 25-30%. Will refer to ICD. Continue CR Maintenance program at [facility]. Stressed need for weight loss with [place]. Continue Pradaxa for AF. Labs today.  Glori Bickers, MD  10:53 AM

## 2018-09-18 NOTE — Progress Notes (Signed)
Cardiology Office Note   Date:  09/19/2018   ID:  [name], DOB 1946-06-10, MRN [id number]  PCP:  Marin Olp, MD  Cardiologist: Dr. Sherren Mocha, Dr. Pierre Bali  Chief Complaint  Patient presents with  . Congestive Heart Failure  . Hypertension  . Atrial Fibrillation  . Cardiomyopathy  . Follow-up    History of Present Illness: [name] is a 72 y.o. male who presents for CAD and heart failure follow-up, seen for [name]  [name] has a prior history of CAD s/p DES/PCI to the LAD in 2004, anterior STEMI 2008 secondary to very late presentation stent thrombosis.  IVIS demonstrated an under deployed stent and the stent was dilated to 3.25 mm with good result. Cardiac cath in 2017 showed patent stent in the LAD, severe ostial stenosis of the left circumflex not found to be amenable to PCI, patent nondominant RCA supplying collaterals to the diagonal branch to the LAD. Medical therapy was recommended at that time including long-term Plavix. Patient also has a prior hx of  chronic atrial fibrillation, chronic combined systolic and diastolic CHF, OSA on CPAP, morbid obesity, hypertension, HLD and type 2 diabetes mellitus.  He was last seen by [name] 04/09/2017 complaining of worsening dyspnea on exertion and edema. Spironolactone was increased to 25 mg daily. BNP was found to be elevated at 1112 and echocardiogram showed LV EF of 25 to 30% with moderate to severe LVH, G3 DD. When compared to prior echo, there was worsening of the LV and diastolic function with hypokinesis of the entire myocardium with akinetic segments of the mid-apical anterior, mid-anteriorseptal and apical lateral and apical myocardium. Echo from 09/29/2016 with LVEF of 40 to 45%. Lasix was increased to 40 mg twice daily and [name] recommended follow-up with further titration of medications with consideration of Entresto and would eventually need a right and left heart cardiac  catheterization. This was performed on 06/01/2018. Cath revealed a widely patent the left main, LAD and left circumflex arteries.  There was severe ostial circumflex and ostial OM lesions however this was unchanged from prior cath in 2017.  He was noted to have well compensated left and right heart hemodynamics with low cardiac output calculated by [facility] with recommendations for ongoing medical therapy for heart failure. Given this he was referred to Dr. Haroldine Laws for further management, initially seen in the HF clinic 05/28/2018.  He saw Dr. Haroldine Laws on 09/12/2018. Overall he was feeling well. He was continuing with the cardiac rehab maintenance program twice weekly, long-term.  He reported better exercise and ambulation tolerate  that he did not have to stop in the grocery store however does get mildly short of breath.  He uses a cart like a walker.  He uses CPAP every night and takes an extra Lasix on occasion however had not needed to in some time.  Today he presents for follow-up and is feeling well although has a bit of a cold. No complaints of chest pain, palpitations, LE swelling, orthopnea, dizziness or syncope. He continues with cardiac rehab twice a week and doing well. He reports that he will be getting the book for the [place] on Friday to help with weight loss, although he has had almost a 10lb weight loss since 09/12/18. He is scheduled to see Dr. Rayann Heman on 12/23 for possible ICD workup and seems a bit reluctant but is open to discuss. He is wearing his compression stockings and has minimal  swelling.   Past Medical History:  Diagnosis Date  . Atrial fibrillation ([place])    initial diagnoses 2012  . B12 deficiency    per patient previously taking shots  . CORONARY ARTERY DISEASE 04/12/2007   2 stents last in 2006.   Marland Kitchen DIABETES MELLITUS, TYPE II 04/16/2007  . Diverticulosis of colon (without mention of hemorrhage)   . HYPERLIPIDEMIA 04/16/2007   reveal study. not sure of medication  .  HYPERTENSION 04/12/2007  . HYPOTHYROIDISM 04/12/2007  . Ischemic cardiomyopathy    cath 35-40%  . MYOCARDIAL INFARCTION, HX OF 04/12/2007  . OBESITY 06/16/2009  . SLEEP APNEA 04/12/2007   CPAP  . Stroke ([place])   . ULCERATIVE COLITIS, LEFT SIDED 11/23/2010    Past Surgical History:  Procedure Laterality Date  . CARDIAC CATHETERIZATION  10/2002   STENT. 2 stents Dr. Maurene Capes  . CARDIAC CATHETERIZATION N/A 10/28/2015   Procedure: Right/Left Heart Cath and Coronary Angiography;  Surgeon: Sherren Mocha, MD;  Location: [facility];  Service: Cardiovascular;  Laterality: N/A;  . CORONARY STENT PLACEMENT  2008   LAD   . RIGHT/LEFT HEART CATH AND CORONARY ANGIOGRAPHY N/A 06/01/2018   Procedure: RIGHT/LEFT HEART CATH AND CORONARY ANGIOGRAPHY;  Surgeon: Sherren Mocha, MD;  Location: [facility];  Service: Cardiovascular;  Laterality: N/A;  . TONSILLECTOMY       Current Outpatient Medications  Medication Sig Dispense Refill  . atorvastatin (LIPITOR) 20 MG tablet Take 1 tablet (20 mg total) by mouth daily. 90 tablet 3  . carvedilol (COREG) 25 MG tablet TAKE ONE TABLET TWICE DAILY 180 tablet 2  . clopidogrel (PLAVIX) 75 MG tablet TAKE ONE (1) TABLET BY MOUTH EVERY DAY 90 tablet 1  . fish oil-omega-3 fatty acids 1000 MG capsule Take 2 g by mouth daily.     . furosemide (LASIX) 40 MG tablet Take 1 tablet (40 mg total) by mouth daily. Take extra tablet once daily in the PM as needed for weight gain 3 lbs in 24 hrs or swelling. 30 tablet 11  . glipiZIDE (GLUCOTROL) 10 MG tablet TAKE ONE TABLET BY MOUTH TWICE A DAY 180 tablet 2  . glucosamine-chondroitin 500-400 MG tablet Take 1 tablet by mouth daily as needed (knee flare up or pain).    Marland Kitchen glucose blood test strip Use as instructed to test 4 time daily 400 each 3  . isosorbide mononitrate (IMDUR) 60 MG 24 hr tablet TAKE ONE (1) TABLET BY MOUTH EVERY DAY 90 tablet 3  . JARDIANCE 10 MG TABS tablet TAKE ONE TABLET BY MOUTH EVERY OTHER DAY 30 tablet 3   . mesalamine (APRISO) 0.375 g 24 hr capsule Take 4 capsules (1.5 g total) by mouth daily. 120 capsule 11  . metFORMIN (GLUCOPHAGE) 1000 MG tablet TAKE ONE TABLET BY MOUTH TWICE A DAY WITH A MEAL 180 tablet 1  . PRADAXA 150 MG CAPS capsule TAKE ONE (1) CAPSULE BY MOUTH TWICE A DAY. (EVERY 12 HOURS.) 60 capsule 8  . sacubitril-valsartan (ENTRESTO) 97-103 MG Take 1 tablet by mouth 2 (two) times daily. 60 tablet 6  . sitaGLIPtin (JANUVIA) 100 MG tablet TAKE ONE (1) TABLET EACH DAY 90 tablet 2  . spironolactone (ALDACTONE) 25 MG tablet Take 1 tablet (25 mg total) by mouth daily. 90 tablet 3  . SYNTHROID 50 MCG tablet Take 1 tablet (50 mcg total) by mouth daily before breakfast. 90 tablet 3   No current facility-administered medications for this visit.     Allergies:  Patient has no known allergies.    Social History:  The patient  reports that he quit smoking about 52 years ago. His smoking use included cigarettes. He has a 5.00 pack-year smoking history. He has never used smokeless tobacco. He reports that he does not drink alcohol or use drugs.   Family History:  The patient's family history includes Heart attack in his mother; Heart disease in his father; Hypertension in his brother; Obesity in his brother; Stomach cancer in his maternal aunt and paternal grandmother.    ROS:  Please see the history of present illness. Otherwise, review of systems are positive for none. All other systems are reviewed and negative.    PHYSICAL EXAM: VS:  Ht 5' 9"  (1.753 m)   BMI 41.70 kg/m  , BMI Body mass index is 41.7 kg/m.  General: Obese, NAD Skin: Warm, dry, intact  Head: Normocephalic, atraumatic, clear, moist mucus membranes. Neck: Negative for carotid bruits. No JVD Lungs:Clear to ausculation bilaterally. No wheezes, rales, or rhonchi. Breathing is unlabored. Cardiovascular: Irregularly irregular with S1 S2. No murmurs, rubs, gallops, or LV heave appreciated. Abdomen: Firm, non-tender,  distended with normoactive bowel sounds. MSK: Strength and tone appear normal for age. 5/5 in all extremities Extremities: No edema. No clubbing or cyanosis. DP/PT pulses 2+ bilaterally Neuro: Alert and oriented. No focal deficits. No facial asymmetry. MAE spontaneously. Psych: Responds to questions appropriately with normal affect.    EKG:  EKG is not ordered today.  Recent Labs: 02/07/2018: ALT 8 05/28/2018: Hemoglobin 13.7; NT-Pro BNP 646; Platelets 191 07/11/2018: B Natriuretic Peptide 174.5 09/12/2018: BUN 12; Creatinine, Ser 1.07; Potassium 4.4; Sodium 139    Lipid Panel    Component Value Date/Time   CHOL 74 (L) 02/07/2018 0859   TRIG 82 02/07/2018 0859   HDL 45 02/07/2018 0859   CHOLHDL 1.6 02/07/2018 0859   CHOLHDL 1.7 10/25/2015 0518   VLDL 16 10/25/2015 0518   LDLCALC 13 02/07/2018 0859   LDLDIRECT 18.0 06/13/2017 1504     Wt Readings from Last 3 Encounters:  09/12/18 282 lb 6.4 oz (128.1 kg)  07/11/18 271 lb 2 oz (123 kg)  06/20/18 273 lb 3.2 oz (123.9 kg)    Other studies Reviewed: Additional studies/ records that were reviewed today include:   Echocardiogram 09/12/2018:  Study Conclusions  - Left ventricle: The cavity size was normal. There was mild   concentric hypertrophy. Systolic function was severely reduced.   The estimated ejection fraction was in the range of 25% to 30%.   Wall motion was normal; there were no regional wall motion   abnormalities. Doppler parameters are consistent with restrictive   physiology, indicative of decreased left ventricular diastolic   compliance and/or increased left atrial pressure. Doppler   parameters are consistent with elevated ventricular end-diastolic   filling pressure. - Ventricular septum: Septal motion showed paradox. - Aortic valve: Valve area (VTI): 2.87 cm^2. Valve area (Vmax):   2.61 cm^2. Valve area (Vmean): 2.71 cm^2. - Mitral valve: There was mild regurgitation. - Left atrium: The atrium was severely  dilated. - Right ventricle: The cavity size was normal. Wall thickness was   normal. Systolic function was normal. - Right atrium: The atrium was normal in size. - Tricuspid valve: There was no regurgitation. - Pulmonic valve: There was no regurgitation. - Pulmonary arteries: The main pulmonary artery was normal-sized.   Systolic pressure could not be accurately estimated. - Inferior vena cava: The vessel was normal in size. - Pericardium, extracardiac: There  was no pericardial effusion.  Impressions:  - There is akinesis of the basal and mid inferoseptal,   anteroseptal, anterior and apical septal and anterior walls. LVEF   25-30% unchanged from the prior study on 04/25/2018.   Echocardiogram  04/25/2018: Study Conclusions  - Left ventricle: The cavity size was normal. Wall thickness was increased in a pattern of moderate to severe LVH along the inferior wall, but apex and anterior/anteroseptal segments were not thickened. There was mild focal basal hypertrophy of the septum. Systolic function was severely reduced. The estimated ejection fraction was in the range of 25% to 30%. Diffuse hypokinesis of the entire myocardiam with akinetic segments: Doppler parameters are consistent with a reversible restrictive pattern, indicative of decreased left ventricular diastolic compliance and/or increased left atrial pressure (grade 3 diastolic dysfunction). Acoustic contrast opacification revealed no evidence ofthrombus. - Regional wall motion abnormality: Akinesis of the mid-apical anterior, mid anteroseptal, apical lateral, and apical myocardium; hypokinesis of the mid inferolateral myocardium; moderate hypokinesis of the basal anteroseptal, basal-mid inferoseptal, mid-apical inferior, and apical septal myocardium. - Aortic valve: Left coronary cusp mobility was moderately restricted. Transvalvular velocity was within the normal range. There was no  stenosis. There was no regurgitation. - Mitral valve: Calcified annulus. Mobility was mildly restricted. There was mild regurgitation. - Left atrium: The atrium was severely dilated. - Right ventricle: The cavity size was moderately dilated. Wall thickness was normal. - Right atrium: The atrium was moderately to severely dilated. - Atrial septum: No defect or patent foramen ovale was identified. - Pulmonary arteries: Systolic pressure was moderately to severely increased. PA peak pressure: 48 mm Hg (S). - Pericardium, extracardiac: A small pericardial effusion was identified.  Impressions:  - When compared to prior study, some interval worsening of LV systolic and diastolic function, with wall motion as noted above. Aortic valve calcification with restricted left coronary cusp but without significant stenosis. Biatrial enlargement. Elevated right sided pressures. Echo contrast used to exclude LV thrombus and better assess wall motion.  Cardiac catheterization 10/2015:   Prox LAD lesion, 20% stenosed. The lesion was previously treated with a drug-eluting stent greater than two years ago.  1st Diag lesion, 100% stenosed.  Ost Cx lesion, 80% stenosed.  Ost 2nd Mrg to 2nd Mrg lesion, 95% stenosed.  Mid RCA lesion, 25% stenosed.  Ost Ramus lesion, 50% stenosed.  There is moderate to severe left ventricular systolic dysfunction.  1. Severe LCx stenosis, involving the ostium of the vessel and ostium of OM1, anatomy unsuitable for PCI 2. Continued patency of the stented segment in the LAD and total occlusion of the first diagonal with collateral flow from the RCA 3. Widely patent, dominant RCA 4. Moderately severe segmental LV dysfunction  Recommend: medical therapy for CHF/CAD. Aggressive risk reduction. Resume pradaxa tonight, anticipate DC home tomorrow.   ASSESSMENT AND PLAN:  1.  Chronic combined systolic and diastolic CHF, ischemic  cardiomyopathy: -Last echocardiogram with persistently depressed LV function at 25 to 30% with akinesis of the basal and mid inferoseptal, anteroseptal, anterior and apical septal and anterior walls. LV unchanged from the prior study on 04/25/2018 -Per Dr. Haroldine Laws note, recommendations are for EP referral for ICD consideration and pt has an upcoming appointment with Dr. Rayann Heman on 10/01/2018 -Continue Entresto 97/103 mg twice daily, carvedilol 25 mg twice daily, spironolactone 25 mg daily -Last creatinine, 1.07 on 12/04 -Continue Lasix 54m daily  -Last BMET 09/12/18 with creatinine at 1.07 and a baseline of 1.0-1.2 -Weight today 273lb, down from 282lb on  09/12/18       2.  History of CAD: -LHC from 2017 with circumflex stenosis, unsuitable for PCI, continue patency of LAD stent and total occlusion of the first diagonal with collaterals from RCA and widely patent RCA -Cath from 05/28/2018 with stable coronary anatomy -No anginal symptoms -Continue statin, Plavix -Continue IMDUR 75m daily  -No ASA in the setting of Pradaxa for AF  3.  Chronic atrial fibrillation: -Chronic, rate controlled with carvedilol  -Continue Pradaxa for anticoagulation with no s/s bleeding  4.  Hypertension: -Stable, 128/80 today  -Continue current regimen   5.  OSA on CPAP: -Reports compliance   Current medicines are reviewed at length with the patient today.  The patient does not have concerns regarding medicines.  The following changes have been made:  no change  Labs/ tests ordered today include: None  No orders of the defined types were placed in this encounter.   Disposition:   FU with Dr. CBurt Knackin 4 months after seen by EP  Signed, JKathyrn Drown NP  09/19/2018 10:15 AM    [place]  [id number]: [phone] Fax: [phone]

## 2018-09-19 ENCOUNTER — Encounter: Payer: Self-pay | Admitting: Physician Assistant

## 2018-09-19 ENCOUNTER — Ambulatory Visit (INDEPENDENT_AMBULATORY_CARE_PROVIDER_SITE_OTHER): Payer: Medicare Other | Admitting: Cardiology

## 2018-09-19 VITALS — BP 128/80 | HR 59 | Ht 69.0 in | Wt 273.4 lb

## 2018-09-19 DIAGNOSIS — I4819 Other persistent atrial fibrillation: Secondary | ICD-10-CM

## 2018-09-19 DIAGNOSIS — I255 Ischemic cardiomyopathy: Secondary | ICD-10-CM

## 2018-09-19 DIAGNOSIS — I5022 Chronic systolic (congestive) heart failure: Secondary | ICD-10-CM | POA: Diagnosis not present

## 2018-09-19 DIAGNOSIS — I2510 Atherosclerotic heart disease of native coronary artery without angina pectoris: Secondary | ICD-10-CM

## 2018-09-19 DIAGNOSIS — I10 Essential (primary) hypertension: Secondary | ICD-10-CM

## 2018-09-19 NOTE — Patient Instructions (Signed)
Medication Instructions:  Your physician recommends that you continue on your current medications as directed. Please refer to the Current Medication list given to you today.  If you need a refill on your cardiac medications before your next appointment, please call your pharmacy.   Lab work: NONE If you have labs (blood work) drawn today and your tests are completely normal, you will receive your results only by: Marland Kitchen MyChart Message (if you have MyChart) OR . A paper copy in the mail If you have any lab test that is abnormal or we need to change your treatment, we will call you to review the results.  Testing/Procedures: NONE  Follow-Up: At [facility], you and your health needs are our priority.  As part of our continuing mission to provide you with exceptional heart care, we have created designated Provider Care Teams.  These Care Teams include your primary Cardiologist (physician) and Advanced Practice Providers (APPs -  Physician Assistants and Nurse Practitioners) who all work together to provide you with the care you need, when you need it. You will need a follow up appointment in:  4 months.  Please call our office 2 months in advance to schedule this appointment.  You may see Sherren Mocha, MD or one of the following Advanced Practice Providers on your designated Care Team: Richardson Dopp, PA-C [place], [place] . Daune Perch, NP  Any Other Special Instructions Will Be Listed Below (If Applicable).

## 2018-09-25 ENCOUNTER — Ambulatory Visit: Payer: Medicare Other | Admitting: Nutrition

## 2018-10-01 ENCOUNTER — Institutional Professional Consult (permissible substitution): Payer: Medicare Other | Admitting: Internal Medicine

## 2018-10-18 ENCOUNTER — Telehealth: Payer: Self-pay | Admitting: Internal Medicine

## 2018-10-18 ENCOUNTER — Other Ambulatory Visit: Payer: Self-pay | Admitting: Internal Medicine

## 2018-10-18 NOTE — Telephone Encounter (Signed)
Pharmacy called in regards to [place], Stated they are sending an e-script also. Please Advise, thanks

## 2018-10-18 NOTE — Telephone Encounter (Signed)
RX sent

## 2018-10-24 ENCOUNTER — Ambulatory Visit (INDEPENDENT_AMBULATORY_CARE_PROVIDER_SITE_OTHER): Payer: Medicare Other | Admitting: Internal Medicine

## 2018-10-24 ENCOUNTER — Encounter: Payer: Self-pay | Admitting: Internal Medicine

## 2018-10-24 VITALS — BP 120/82 | HR 90 | Ht 69.0 in | Wt 273.0 lb

## 2018-10-24 DIAGNOSIS — E1151 Type 2 diabetes mellitus with diabetic peripheral angiopathy without gangrene: Secondary | ICD-10-CM | POA: Diagnosis not present

## 2018-10-24 DIAGNOSIS — E785 Hyperlipidemia, unspecified: Secondary | ICD-10-CM

## 2018-10-24 DIAGNOSIS — E039 Hypothyroidism, unspecified: Secondary | ICD-10-CM | POA: Diagnosis not present

## 2018-10-24 LAB — POCT GLYCOSYLATED HEMOGLOBIN (HGB A1C): Hemoglobin A1C: 7.7 % — AB (ref 4.0–5.6)

## 2018-10-24 NOTE — Patient Instructions (Signed)
Please continue: - Glipizide 10 mg 2x a day - Metformin 1000 mg 2x a day - Januvia 100 mg daily  - Jardiance 10 mg in a.m., every other day  Please stop at the lab.  Please return in 4 months with your sugar log.

## 2018-10-24 NOTE — Progress Notes (Signed)
Patient ID: [name], male   DOB: 1946-05-11, 73 y.o.   MRN: [id number]  HPI: [name] is a 73 y.o.-year-old male, returning for DM2, dx 2004, non-insulin-dependent, uncontrolled, with complications (CAD, s/p MI). Last visit 4 months ago. He has [facility] as supplemental and [employer] for meds in 10/2015.  Before last visit, he was diagnosed with CHF -EF of 25 to 30%.  His Lasix was increased to twice a day before last visit (takes the second dose as needed). Cardiac cath >> no significant obstruction.  He was started on Entresto.  He now is in cardiac rehab, which he has a date in the morning.  Last hemoglobin A1c was: 02/13/2018: The HbA1c calculated from the fructosamine is excellent, at 6.3%! 10/16/2017: HbA1c calculated from fructosamine is: 7.3% (higher). 06/16/2017: HbA1c calculated from the fructosamine is stable, at 6.8%.  12/08/2016: HbA1c calculated from the fructosamine is better, at 6.86%. 08/10/2016: HbA1c calculated from  fructosamine is 7.88% 05/10/2016: HbA1c calculated from fructosamine is 7.6% 01/06/2016: HbA1c calculated from fructosamine is 6.6% 09/06/2016: HbA1c calculated from fructosamine is 7.0% 06/01/2015: HbA1c calculated from fructosamine is 7.0% 02/23/2015: HbA1c calculated from fructosamine is 6.88% 10/15/2014: HbA1c calculated from fructosamine is 7.17% Lab Results  Component Value Date   HGBA1C 8.8 06/16/2017   HGBA1C 9.3 03/10/2017   HGBA1C 7.6 (H) 10/25/2015  He started to change his eating habits since 03/2013.   Pt is on a regimen of: - Glipizide 10 mg 2x a day - Metformin 1000 mg 2x a day - Januvia 100 mg daily  - Jardiance 10 mg in a.m., every other day-due to increased urination He does not miss doses.  Pt checks his sugars 4 times a day: - am: 109-137 >> 112-156, 206 >> 112-159 >> 110-138 - 2h after breakfast: 104-126, 185 >> 126-170 >> 142-151 - before lunch 144 >> 110-120s >> n/c  - before dinner: 84, 119-175, 287 >> 102-137 >>  113-138 - 2h after dinner:  89-144, 166 >> 128-150 >> 136-155 He has hypoglycemia awareness in the 60s Highest: 287 >> 170 >> 155  His wife is a Radiographer, therapeutic >> he does eat sweets. He saw [name] (nutritionist).  -+ Mild CKD, last BUN/creatinine:  Lab Results  Component Value Date   BUN 12 09/12/2018   CREATININE 1.07 09/12/2018  On Entresto.  -+ Hyperlipidemia;  last set of lipids: Lab Results  Component Value Date   CHOL 74 (L) 02/07/2018   HDL 45 02/07/2018   LDLCALC 13 02/07/2018   LDLDIRECT 18.0 06/13/2017   TRIG 82 02/07/2018   CHOLHDL 1.6 02/07/2018  He was in the Reveal Study x 4 years >> stopped. He is on Lipitor + omega-3 fatty acids.  - last eye exam was 10/2017: No DR ([name]).  He has mild cataracts.  -No numbness and tingling in his feet.  He also has a history of OSA-on CPAP, very compliant, A. Fib-on Plavix and pradaxa, IBD, GERD, vitamin B12 deficiency - previously on im B12, obesity.  He also has hypothyroidism and is on levothyroxine 50 mcg.   Pt takes LT4: - in am - fasting - 10-30 min from b'fast - no Ca, Fe, MVI, PPIs - not on Biotin   Latest TSH was normal: Lab Results  Component Value Date   TSH 0.86 06/13/2017   ROS: Constitutional: no weight gain/no weight loss, no fatigue, no subjective hyperthermia, no subjective hypothermia Eyes: no blurry vision, no xerophthalmia ENT: no sore throat, no nodules  palpated in neck, no dysphagia, no odynophagia, no hoarseness Cardiovascular: no CP/no SOB/no palpitations/no leg swelling Respiratory: no cough/no SOB/no wheezing Gastrointestinal: no N/no V/no D/no C/no acid reflux Musculoskeletal: no muscle aches/no joint aches Skin: no rashes, no hair loss Neurological: no tremors/no numbness/no tingling/no dizziness  I reviewed pt's medications, allergies, PMH, social hx, family hx, and changes were documented in the history of present illness. Otherwise, unchanged from my initial visit  note.  Past Medical History:  Diagnosis Date  . Atrial fibrillation ([place])    initial diagnoses 2012  . B12 deficiency    per patient previously taking shots  . CORONARY ARTERY DISEASE 04/12/2007   2 stents last in 2006.   Marland Kitchen DIABETES MELLITUS, TYPE II 04/16/2007  . Diverticulosis of colon (without mention of hemorrhage)   . HYPERLIPIDEMIA 04/16/2007   reveal study. not sure of medication  . HYPERTENSION 04/12/2007  . HYPOTHYROIDISM 04/12/2007  . Ischemic cardiomyopathy    cath 35-40%  . MYOCARDIAL INFARCTION, HX OF 04/12/2007  . OBESITY 06/16/2009  . SLEEP APNEA 04/12/2007   CPAP  . Stroke ([place])   . ULCERATIVE COLITIS, LEFT SIDED 11/23/2010   Past Surgical History:  Procedure Laterality Date  . CARDIAC CATHETERIZATION  10/2002   STENT. 2 stents Dr. Maurene Capes  . CARDIAC CATHETERIZATION N/A 10/28/2015   Procedure: Right/Left Heart Cath and Coronary Angiography;  Surgeon: Sherren Mocha, MD;  Location: [facility];  Service: Cardiovascular;  Laterality: N/A;  . CORONARY STENT PLACEMENT  2008   LAD   . RIGHT/LEFT HEART CATH AND CORONARY ANGIOGRAPHY N/A 06/01/2018   Procedure: RIGHT/LEFT HEART CATH AND CORONARY ANGIOGRAPHY;  Surgeon: Sherren Mocha, MD;  Location: [facility];  Service: Cardiovascular;  Laterality: N/A;  . TONSILLECTOMY     Social History   Socioeconomic History  . Marital status: Married    Spouse name: Not on file  . Number of children: 1  . Years of education: Not on file  . Highest education level: Not on file  Occupational History  . Occupation: RETIRED    Employer: RETIRED  Social Needs  . Financial resource strain: Not on file  . Food insecurity:    Worry: Not on file    Inability: Not on file  . Transportation needs:    Medical: Not on file    Non-medical: Not on file  Tobacco Use  . Smoking status: Former Smoker    Packs/day: 1.00    Years: 5.00    Pack years: 5.00    Types: Cigarettes    Last attempt to quit: 04/13/1966    Years since quitting:  52.5  . Smokeless tobacco: Never Used  Substance and Sexual Activity  . Alcohol use: No    Alcohol/week: 0.0 standard drinks    Comment: no more beer  . Drug use: No  . Sexual activity: Not on file  Lifestyle  . Physical activity:    Days per week: Not on file    Minutes per session: Not on file  . Stress: Not on file  Relationships  . Social connections:    Talks on phone: Not on file    Gets together: Not on file    Attends religious service: Not on file    Active member of club or organization: Not on file    Attends meetings of clubs or organizations: Not on file    Relationship status: Not on file  . Intimate partner violence:    Fear of current or ex  partner: Not on file    Emotionally abused: Not on file    Physically abused: Not on file    Forced sexual activity: Not on file  Other Topics Concern  . Not on file  Social History Narrative   Cardiorehab 3 days a week. 45 minutes to an hour-stationary bike.    GRANDDAUGHTER (MS. 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       Married for 37 years in 2015, married previously for 14 years. Daughter with first wife and 3 grandkids.    Lives alone with wife. Get to see grandkids a lot. New grandchild in middle of 50      Hobbies-yardwork, previously liked to hunt and fish, does some target shooting   Current Outpatient Medications on File Prior to Visit  Medication Sig Dispense Refill  . atorvastatin (LIPITOR) 20 MG tablet Take 1 tablet (20 mg total) by mouth daily. 90 tablet 3  . carvedilol (COREG) 25 MG tablet TAKE ONE TABLET TWICE DAILY 180 tablet 2  . clopidogrel (PLAVIX) 75 MG tablet TAKE ONE (1) TABLET BY MOUTH EVERY DAY 90 tablet 1  . fish oil-omega-3 fatty acids 1000 MG capsule Take 2 g by mouth daily.     . furosemide (LASIX) 40 MG tablet Take 1 tablet (40 mg total) by mouth daily. Take extra tablet once daily in  the PM as needed for weight gain 3 lbs in 24 hrs or swelling. 30 tablet 11  . glipiZIDE (GLUCOTROL) 10 MG tablet TAKE ONE TABLET BY MOUTH TWICE A DAY 180 tablet 2  . glucosamine-chondroitin 500-400 MG tablet Take 1 tablet by mouth daily as needed (knee flare up or pain).    Marland Kitchen glucose blood test strip Use as instructed to test 4 time daily 400 each 3  . isosorbide mononitrate (IMDUR) 60 MG 24 hr tablet TAKE ONE (1) TABLET BY MOUTH EVERY DAY 90 tablet 3  . JARDIANCE 10 MG TABS tablet TAKE ONE (1) TABLET BY MOUTH EVERY OTHER DAY 30 tablet 3  . mesalamine (APRISO) 0.375 g 24 hr capsule Take 4 capsules (1.5 g total) by mouth daily. 120 capsule 11  . metFORMIN (GLUCOPHAGE) 1000 MG tablet TAKE ONE TABLET BY MOUTH TWICE A DAY WITH A MEAL 180 tablet 1  . PRADAXA 150 MG CAPS capsule TAKE ONE (1) CAPSULE BY MOUTH TWICE A DAY. (EVERY 12 HOURS.) 60 capsule 8  . sacubitril-valsartan (ENTRESTO) 97-103 MG Take 1 tablet by mouth 2 (two) times daily. 60 tablet 6  . sitaGLIPtin (JANUVIA) 100 MG tablet TAKE ONE (1) TABLET EACH DAY 90 tablet 2  . spironolactone (ALDACTONE) 25 MG tablet Take 1 tablet (25 mg total) by mouth daily. 90 tablet 3  . SYNTHROID 50 MCG tablet Take 1 tablet (50 mcg total) by mouth daily before breakfast. 90 tablet 3   No current facility-administered medications on file prior to visit.    No Known Allergies Family History  Problem Relation Age of Onset  . Heart attack Mother        mid 77s  . Heart disease Father        H/O CAD, CABG, VALVE SURGERY  . Hypertension Brother   . Obesity Brother   . Stomach cancer Maternal Aunt   . Stomach cancer Paternal Grandmother        ? colon     PE:  BP 120/82   Pulse 90   Ht 5' 9"  (1.753 m)   Wt 273 lb (123.8 kg)   SpO2 98%   BMI 40.32 kg/m  Body mass index is 40.37 kg/m.  Wt Readings from Last 3 Encounters:  10/24/18 273 lb (123.8 kg)  09/19/18 273 lb 6.4 oz (124 kg)  09/12/18 282 lb 6.4 oz (128.1 kg)   Constitutional: overweight,  in NAD Eyes: PERRLA, EOMI, no exophthalmos ENT: moist mucous membranes, no thyromegaly, no cervical lymphadenopathy Cardiovascular: RRR, No MRG, + bilateral LE edema L>R Respiratory: CTA B Gastrointestinal: abdomen soft, NT, ND, BS+ Musculoskeletal: no deformities, strength intact in all 4 Skin: moist, warm, no rashes Neurological: no tremor with outstretched hands, DTR normal in all 4  ASSESSMENT: 1. DM2, non-insulin-dependent, uncontrolled, with complications - CAD, s/p MI 10/2006 - s/p stent - sees [name]  2. Obesity class 3 BMI Classification:  < 18.5 underweight   18.5-24.9 normal weight   25.0-29.9 overweight   30.0-34.9 class I obesity   35.0-39.9 class II obesity   ? 40.0 class III obesity   3. HL  4.  Hypothyroidism  PLAN:  1. Patient with longstanding, fairly well-controlled diabetes, on oral antidiabetic regimen.  We have him on Jardiance low dose but unfortunately discharged to be dosed every other day due to increased urination. -We usually follow him with fructosamine levels since these are more accurate for him.  Once a year we try to check an HbA1c, also. -At this visit, sugars are improved after he changed his diet and started to be more active by exercising in cardiac rehab 3 times a week in the morning.  No low blood sugars.  No need to change his regimen for now. - I advised him to Patient Instructions  Please continue: - Glipizide 10 mg 2x a day - Metformin 1000 mg 2x a day - Januvia 100 mg daily  - Jardiance 10 mg in a.m., every other day  Please stop at the lab.  Please return in 4 months with your sugar log.   - today, HbA1c is 7.7% (improved).  However, we will also check a fructosamine level. - continue checking sugars at different times of the day - check 2x a day, rotating checks - advised for yearly eye exams >> he is UTD - Return to clinic in 4 mo with sugar log       2. Obesity class 3 -No net weight gain since last  visit -Continue Jardiance which should also help with weight loss  3. HL - Reviewed latest lipid panel from 02/2018: LDL very low, as is histotal cholesterol.  The rest of the fractions are at goal Lab Results  Component Value Date   CHOL 74 (L) 02/07/2018   HDL 45 02/07/2018   LDLCALC 13 02/07/2018   LDLDIRECT 18.0 06/13/2017   TRIG 82 02/07/2018   CHOLHDL 1.6 02/07/2018  - Continues Lipitor and omega-3 fatty acids without side effects.  4.  Hypothyroidism -Upon questioning, he is not taking the levothyroxine correctly 30 minutes before eating.  He may also missed doses (for example he did not take the dose today).  I advised him to take levothyroxine first thing in the morning and wait at least 30 minutes before he eats and also to take it every day.  He is not on calcium, multivitamins, iron, or PPIs but I advised him that this can interfere with the medication and he should space them at least 4 hours after levothyroxine. -  We will recheck his TFTs at next visit.  Latest TSH was from 06/2017 and this was normal.  Office Visit on 10/24/2018  Component Date Value Ref Range Status  . Fructosamine 10/24/2018 295* 205 - 285 umol/L Final  . Hemoglobin A1C 10/24/2018 7.7* 4.0 - 5.6 % Final   HbA1c calculated from fructosamine is slightly higher than the one from 02/2018, at 6.6%.  Philemon Kingdom, MD PhD [facility]

## 2018-10-27 LAB — FRUCTOSAMINE: FRUCTOSAMINE: 295 umol/L — AB (ref 205–285)

## 2018-10-30 ENCOUNTER — Encounter: Payer: Self-pay | Admitting: Nutrition

## 2018-10-30 ENCOUNTER — Encounter: Payer: Medicare Other | Attending: Family Medicine | Admitting: Nutrition

## 2018-10-30 VITALS — Ht 68.0 in | Wt 280.0 lb

## 2018-10-30 DIAGNOSIS — E1165 Type 2 diabetes mellitus with hyperglycemia: Secondary | ICD-10-CM | POA: Diagnosis not present

## 2018-10-30 DIAGNOSIS — E118 Type 2 diabetes mellitus with unspecified complications: Secondary | ICD-10-CM | POA: Insufficient documentation

## 2018-10-30 DIAGNOSIS — IMO0002 Reserved for concepts with insufficient information to code with codable children: Secondary | ICD-10-CM

## 2018-10-30 NOTE — Progress Notes (Signed)
Medical Nutrition Therapy:  Appt start time: 1100  end time: 1130 Assessment:  Primary concerns today: DIabetes Type 2, Obesity and CVD. Lives with his wife. Eats out twice a day.  PCP Tora Duck. [name] is Heart MD.  Dr. Manuela Neptune is DM MD..  Most recent A1C 7.7%, increased from 7.1% previously. Gained 7 lbs since last visit with me. Marland Kitchen Has DM Type for 7-10 yrs. Changes made: has been working on cutting out sweets, and ice cream.. Drinking more water. Doing cardiac rehab three times per week.  Wants to start going to [employer]. Still skipping lunch. Limited engagement with being more active to lose weight. Stress is driving grandkids around. Willing to work on [employer] and cutting out food after supper. Metformin 1000 mg BID, Jardiance EOD and Glipizide 10 mg BID.   CMP Latest Ref Rng & Units 09/12/2018 07/11/2018 05/28/2018  Glucose 70 - 99 mg/dL 152(H) 190(H) 147(H)  BUN 8 - 23 mg/dL 12 15 17   Creatinine 0.61 - 1.24 mg/dL 1.07 1.10 1.27  Sodium 135 - 145 mmol/L 139 137 139  Potassium 3.5 - 5.1 mmol/L 4.4 4.7 4.8  Chloride 98 - 111 mmol/L 105 100 100  CO2 22 - 32 mmol/L 24 24 21   Calcium 8.9 - 10.3 mg/dL 9.2 9.2 8.9  Total Protein 6.0 - 8.5 g/dL - - -  Total Bilirubin 0.0 - 1.2 mg/dL - - -  Alkaline Phos 39 - 117 IU/L - - -  AST 0 - 40 IU/L - - -  ALT 0 - 44 IU/L - - -   Lipid Panel     Component Value Date/Time   CHOL 74 (L) 02/07/2018 0859   TRIG 82 02/07/2018 0859   HDL 45 02/07/2018 0859   CHOLHDL 1.6 02/07/2018 0859   CHOLHDL 1.7 10/25/2015 0518   VLDL 16 10/25/2015 0518   LDLCALC 13 02/07/2018 0859   LDLDIRECT 18.0 06/13/2017 1504       Preferred Learning Style:   No preference indicated   Learning Readiness:  Ready  Change in progress   MEDICATIONS:  DIETARY INTAKE:   24-hr recall:  B ( 07-1029 AM): 2 eggs. tomatoes 2 slices [place] toast, coffee cream and splenda Snk ( AM):  L ( PM): skip lunch Snk ( PM): D ( 4  PM): Meatloaf, string  beans, and fruit bowl, water Snk ( PM): popcorn occasionally, or fruit Beverages: water   Estimated energy needs: 1800  calories 200  g carbohydrates 135 g protein  43 fat  Progress Towards Goal(s):  Resolved.   Nutritional Diagnosis:  NB-1.1 Food and nutrition-related knowledge deficit As related to Diabetes Type 2.  As evidenced by A1C.    Intervention:  Nutrition and Diabetes education provided on My Plate, CHO counting, meal planning, portion sizes, timing of meals, avoiding snacks between meals unless having a low blood sugar, target ranges for A1C and blood sugars, signs/symptoms and treatment of hyper/hypoglycemia, monitoring blood sugars, taking medications as prescribed, benefits of exercising 30 minutes per day and prevention of complications of DM. Heart Healthy Diet, weight loss tips.   Goals 1. Go to bed by 9 pm  To cut out late night snacking. 2.  Don't eat past 7 pm 3. Look into [employer] 4. Lose 5 lbs per month Get A1C down to 7% Eat lunch daily   Teaching Method Utilized:  Visual Auditory Hands on  Handouts given during visit include:  The Plate MEthod   Meal Plan Card  Barriers to learning/adherence to lifestyle change: none  Demonstrated degree of understanding via:  Teach Back   Monitoring/Evaluation:  Dietary intake, exercise, meal planning , and body weight in 3 month(s).

## 2018-10-30 NOTE — Patient Instructions (Addendum)
Goals 1. Go to bed by 9 pm  To cut out late night snacking. 2.  Don't eat past 7 pm 3. Look into [employer] 4. Lose 5 lbs per month Get A1C down to 7% Eat lunch daily

## 2018-11-08 ENCOUNTER — Encounter: Payer: Self-pay | Admitting: Internal Medicine

## 2018-11-08 DIAGNOSIS — E119 Type 2 diabetes mellitus without complications: Secondary | ICD-10-CM | POA: Diagnosis not present

## 2018-11-08 DIAGNOSIS — Z7984 Long term (current) use of oral hypoglycemic drugs: Secondary | ICD-10-CM | POA: Diagnosis not present

## 2018-11-08 DIAGNOSIS — H2513 Age-related nuclear cataract, bilateral: Secondary | ICD-10-CM | POA: Diagnosis not present

## 2018-11-08 LAB — HM DIABETES EYE EXAM

## 2018-11-19 ENCOUNTER — Encounter: Payer: Self-pay | Admitting: Internal Medicine

## 2018-11-19 ENCOUNTER — Ambulatory Visit (INDEPENDENT_AMBULATORY_CARE_PROVIDER_SITE_OTHER): Payer: Medicare Other | Admitting: Internal Medicine

## 2018-11-19 VITALS — BP 118/76 | HR 82 | Ht 68.0 in | Wt 269.2 lb

## 2018-11-19 DIAGNOSIS — I482 Chronic atrial fibrillation, unspecified: Secondary | ICD-10-CM

## 2018-11-19 DIAGNOSIS — I255 Ischemic cardiomyopathy: Secondary | ICD-10-CM

## 2018-11-19 DIAGNOSIS — G4733 Obstructive sleep apnea (adult) (pediatric): Secondary | ICD-10-CM

## 2018-11-19 DIAGNOSIS — I5022 Chronic systolic (congestive) heart failure: Secondary | ICD-10-CM | POA: Diagnosis not present

## 2018-11-19 NOTE — Patient Instructions (Addendum)
Medication Instructions:  Your physician recommends that you continue on your current medications as directed. Please refer to the Current Medication list given to you today.  Labwork: None ordered.  Testing/Procedures: Your physician has recommended that you have a defibrillator inserted. An implantable cardioverter defibrillator (ICD) is a small device that is placed in your chest or, in rare cases, your abdomen. This device uses electrical pulses or shocks to help control life-threatening, irregular heartbeats that could lead the heart to suddenly stop beating (sudden cardiac arrest). Leads are attached to the ICD that goes into your heart. This is done in the hospital and usually requires an overnight stay. Please see the instruction sheet given to you today for more information.  Follow-Up: Your physician wants you to follow-up in: as needed with Dr. Rayann Heman.     Cardioverter Defibrillator Implantation  An implantable cardioverter defibrillator (ICD) is a small device that is placed under the skin in the chest or abdomen. An ICD consists of a battery, a small computer (pulse generator), and wires (leads) that go into the heart. An ICD is used to detect and correct two types of dangerous irregular heartbeats (arrhythmias):  A rapid heart rhythm (tachycardia).  An arrhythmia in which the lower chambers of the heart (ventricles) contract in an uncoordinated way (fibrillation). When an ICD detects tachycardia, it sends a low-energy shock to the heart to restore the heartbeat to normal (cardioversion). This signal is usually painless. If cardioversion does not work or if the ICD detects fibrillation, it delivers a high-energy shock to the heart (defibrillation) to restart the heart. This shock may feel like a strong jolt in the chest. Your health care provider may prescribe an ICD if:  You have had an arrhythmia that originated in the ventricles.  Your heart has been damaged by a disease or  heart condition. Sometimes, ICDs are programmed to act as a device called a pacemaker. Pacemakers can be used to treat a slow heartbeat (bradycardia) or tachycardia by taking over the heart rate with electrical impulses. Tell a health care provider about:  Any allergies you have.  All medicines you are taking, including vitamins, herbs, eye drops, creams, and over-the-counter medicines.  Any problems you or family members have had with anesthetic medicines.  Any blood disorders you have.  Any surgeries you have had.  Any medical conditions you have.  Whether you are pregnant or may be pregnant. What are the risks? Generally, this is a safe procedure. However, problems may occur, including:  Swelling, bleeding, or bruising.  Infection.  Blood clots.  Damage to other structures or organs, such as nerves, blood vessels, or the heart.  Allergic reactions to medicines used during the procedure.  Medicine Ask your health care provider about:  Changing or stopping your normal medicines. This is important if you take diabetes medicines or blood thinners.  Taking medicines such as aspirin and ibuprofen. These medicines can thin your blood. Do not take these medicines before your procedure if your doctor tells you not to. Tests  You may have blood tests.  You may have a test to check the electrical signals in your heart (electrocardiogram, ECG).  You may have imaging tests, such as a chest X-ray. General instructions  For 24 hours before the procedure, stop using products that contain nicotine or tobacco, such as cigarettes and e-cigarettes. If you need help quitting, ask your health care provider.  Plan to have someone take you home from the hospital or clinic.  You may  be asked to shower with a germ-killing soap. What happens during the procedure?  To reduce your risk of infection: ? Your health care team will wash or sanitize their hands. ? Your skin will be washed with  soap. ? Hair may be removed from the surgical area.  Small monitors will be put on your body. They will be used to check your heart, blood pressure, and oxygen level.  An IV tube will be inserted into one of your veins.  You will be given one or more of the following: ? A medicine to help you relax (sedative). ? A medicine to numb the area (local anesthetic). ? A medicine to make you fall asleep (general anesthetic).  Leads will be guided through a blood vessel into your heart and attached to your heart muscles. Depending on the ICD, the leads may go into one ventricle or they may go into both ventricles and into an upper chamber of the heart. An X-ray machine (fluoroscope) will be usedto help guide the leads.  A small incision will be made to create a deep pocket under your skin.  The pulse generator will be placed into the pocket.  The ICD will be tested.  The incision will be closed with stitches (sutures), skin glue, or staples.  A bandage (dressing) will be placed over the incision. This procedure may vary among health care providers and hospitals. What happens after the procedure?  Your blood pressure, heart rate, breathing rate, and blood oxygen level will be monitored often until the medicines you were given have worn off.  A chest X-ray will be taken to check that the ICD is in the right place.  You will need to stay in the hospital for 1-2 days so your health care provider can make sure your ICD is working.  Do not drive for 24 hours if you received a sedative. Ask your health care provider when it is safe for you to drive.  You may be given an identification card explaining that you have an ICD. Summary  An implantable cardioverter defibrillator (ICD) is a small device that is placed under the skin in the chest or abdomen. It is used to detect and correct dangerous irregular heartbeats (arrhythmias).  An ICD consists of a battery, a small computer (pulse generator),  and wires (leads) that go into the heart.  When an ICD detects rapid heart rhythm (tachycardia), it sends a low-energy shock to the heart to restore the heartbeat to normal (cardioversion). If cardioversion does not work or if the ICD detects uncoordinated heart contractions (fibrillation), it delivers a high-energy shock to the heart (defibrillation) to restart the heart.  You will need to stay in the hospital for 1-2 days to make sure your ICD is working. This information is not intended to replace advice given to you by your health care provider. Make sure you discuss any questions you have with your health care provider. Document Released: 06/18/2002 Document Revised: 10/05/2016 Document Reviewed: 10/05/2016 Elsevier Interactive Patient Education  2019 [employer].

## 2018-11-19 NOTE — Progress Notes (Signed)
Electrophysiology Office Note   Date:  11/19/2018   ID:  Andrew, [name] [birth year], MRN [id number]  PCP:  Marin Olp, MD  Cardiologist:  Dr Burt Knack CHF: Dr Haroldine Laws Primary Electrophysiologist: Thompson Grayer, MD    CC: CHF   History of Present Illness: [name] is a 73 y.o. male who presents today for electrophysiology evaluation.   He is referred by Dr Haroldine Laws for EP consultation regarding risk stratification of sudden death.[ The patient has CAD s/p DES, permanent afib, chronic combined CHF< OSA on CPAP and morbid obesity. He has had progressive afib for several years.  EF 35%.  He participates in cardiac rehab but has SOB with moderate activity.  He uses a cane and walks slowly at baseline.  He has had afib since 2012.  He has severe left atrial enlargement.  Despite medicine optimization, his EF remains depressed.  Today, he denies symptoms of palpitations, chest pain, orthopnea, PND, lower extremity edema, claudication, dizziness, presyncope, syncope, bleeding, or neurologic sequela. The patient is tolerating medications without difficulties and is otherwise without complaint today.    Past Medical History:  Diagnosis Date  . Atrial fibrillation ([place])    initial diagnoses 2012  . B12 deficiency    per patient previously taking shots  . CORONARY ARTERY DISEASE 04/12/2007   2 stents last in 2006.   Marland Kitchen DIABETES MELLITUS, TYPE II 04/16/2007  . Diverticulosis of colon (without mention of hemorrhage)   . HYPERLIPIDEMIA 04/16/2007   reveal study. not sure of medication  . HYPERTENSION 04/12/2007  . HYPOTHYROIDISM 04/12/2007  . Ischemic cardiomyopathy    cath 35-40%  . MYOCARDIAL INFARCTION, HX OF 04/12/2007  . OBESITY 06/16/2009  . SLEEP APNEA 04/12/2007   CPAP  . Stroke ([place])   . ULCERATIVE COLITIS, LEFT SIDED 11/23/2010   Past Surgical History:  Procedure Laterality Date  . CARDIAC CATHETERIZATION  10/2002   STENT. 2 stents Dr. Maurene Capes  . CARDIAC CATHETERIZATION N/A  10/28/2015   Procedure: Right/Left Heart Cath and Coronary Angiography;  Surgeon: Sherren Mocha, MD;  Location: [facility];  Service: Cardiovascular;  Laterality: N/A;  . CORONARY STENT PLACEMENT  2008   LAD   . RIGHT/LEFT HEART CATH AND CORONARY ANGIOGRAPHY N/A 06/01/2018   Procedure: RIGHT/LEFT HEART CATH AND CORONARY ANGIOGRAPHY;  Surgeon: Sherren Mocha, MD;  Location: [facility];  Service: Cardiovascular;  Laterality: N/A;  . TONSILLECTOMY       Current Outpatient Medications  Medication Sig Dispense Refill  . atorvastatin (LIPITOR) 20 MG tablet Take 1 tablet (20 mg total) by mouth daily. 90 tablet 3  . carvedilol (COREG) 25 MG tablet TAKE ONE TABLET TWICE DAILY 180 tablet 2  . clopidogrel (PLAVIX) 75 MG tablet TAKE ONE (1) TABLET BY MOUTH EVERY DAY 90 tablet 1  . fish oil-omega-3 fatty acids 1000 MG capsule Take 2 g by mouth daily.     . furosemide (LASIX) 40 MG tablet Take 1 tablet (40 mg total) by mouth daily. Take extra tablet once daily in the PM as needed for weight gain 3 lbs in 24 hrs or swelling. 30 tablet 11  . glipiZIDE (GLUCOTROL) 10 MG tablet TAKE ONE TABLET BY MOUTH TWICE A DAY 180 tablet 2  . glucosamine-chondroitin 500-400 MG tablet Take 1 tablet by mouth daily as needed (knee flare up or pain).    Marland Kitchen glucose blood test strip Use as instructed to test 4 time daily 400 each 3  . isosorbide mononitrate (  IMDUR) 60 MG 24 hr tablet TAKE ONE (1) TABLET BY MOUTH EVERY DAY 90 tablet 3  . JARDIANCE 10 MG TABS tablet TAKE ONE (1) TABLET BY MOUTH EVERY OTHER DAY 30 tablet 3  . mesalamine (APRISO) 0.375 g 24 hr capsule Take 4 capsules (1.5 g total) by mouth daily. 120 capsule 11  . metFORMIN (GLUCOPHAGE) 1000 MG tablet TAKE ONE TABLET BY MOUTH TWICE A DAY WITH A MEAL 180 tablet 1  . PRADAXA 150 MG CAPS capsule TAKE ONE (1) CAPSULE BY MOUTH TWICE A DAY. (EVERY 12 HOURS.) 60 capsule 8  . sacubitril-valsartan (ENTRESTO) 97-103 MG Take 1 tablet by mouth 2 (two) times  daily. 60 tablet 6  . sitaGLIPtin (JANUVIA) 100 MG tablet TAKE ONE (1) TABLET EACH DAY 90 tablet 2  . spironolactone (ALDACTONE) 25 MG tablet Take 1 tablet (25 mg total) by mouth daily. 90 tablet 3  . SYNTHROID 50 MCG tablet Take 1 tablet (50 mcg total) by mouth daily before breakfast. 90 tablet 3   No current facility-administered medications for this visit.     Allergies:   Patient has no known allergies.   Social History:  The patient  reports that he quit smoking about 52 years ago. His smoking use included cigarettes. He has a 5.00 pack-year smoking history. He has never used smokeless tobacco. He reports that he does not drink alcohol or use drugs.   Family History:  The patient's  family history includes Heart attack in his mother; Heart disease in his father; Hypertension in his brother; Obesity in his brother; Stomach cancer in his maternal aunt and paternal grandmother.    ROS:  Please see the history of present illness.   All other systems are personally reviewed and negative.    PHYSICAL EXAM: VS:  BP 118/76   Pulse 82   Ht 5' 8"  (1.727 m)   Wt 269 lb 3.2 oz (122.1 kg)   SpO2 97%   BMI 40.93 kg/m  , BMI Body mass index is 40.93 kg/m. GEN: overweight, in no acute distress  HEENT: normal  Neck: no JVD, carotid bruits, or masses Cardiac: iRRR; no murmurs, rubs, or gallops,no edema  Respiratory:  clear to auscultation bilaterally, normal work of breathing GI: soft, nontender, nondistended, + BS MS: no deformity or atrophy  Skin: warm and dry  Neuro:  Strength and sensation are intact Psych: euthymic mood, full affect  EKG:  EKG is ordered today. The ekg ordered today is personally reviewed and shows afib, V rate 92 bpm, QRS 120 msec, LAHB   Recent Labs: 02/07/2018: ALT 8 05/28/2018: Hemoglobin 13.7; NT-Pro BNP 646; Platelets 191 07/11/2018: B Natriuretic Peptide 174.5 09/12/2018: BUN 12; Creatinine, Ser 1.07; Potassium 4.4; Sodium 139  personally reviewed   Lipid  Panel     Component Value Date/Time   CHOL 74 (L) 02/07/2018 0859   TRIG 82 02/07/2018 0859   HDL 45 02/07/2018 0859   CHOLHDL 1.6 02/07/2018 0859   CHOLHDL 1.7 10/25/2015 0518   VLDL 16 10/25/2015 0518   LDLCALC 13 02/07/2018 0859   LDLDIRECT 18.0 06/13/2017 1504   personally reviewed   Wt Readings from Last 3 Encounters:  11/19/18 269 lb 3.2 oz (122.1 kg)  10/30/18 280 lb (127 kg)  10/24/18 273 lb (123.8 kg)      Other studies personally reviewed: Additional studies/ records that were reviewed today include: CHF clinic notes, cardiology noted, prior cath, echo  Review of the above records today demonstrates: echo 09/12/18 reveals  EF 25%, severe LA enlargement, mild MR   ASSESSMENT AND PLAN:  1.  The patient has an ischemic CM (EF 25%), NYHA Class III CHF, and CAD.  He is referred by Dr Haroldine Laws for risk stratification of sudden death and consideration of ICD implantation.  At this time, he meets MADIT II/ SCD-HeFT criteria for ICD implantation for primary prevention of sudden death.  I have had a thorough discussion with the patient reviewing options.  The patient and their granddaughter have had opportunities to ask questions and have them answered.    Risks, benefits, alternatives to ICD implantation were discussed in detail with the patient today. The patient is very clear at this time that he wishes to defer ICD implantation.  He will think about this further and contact my office if he decides to proceed.  2. Permanent afib Rate controlled chads2vasc score is 5  3. CAD No ischemic symptoms  4. Bleeding risks Given pradaxa and plavix, I worry about bleeding risks of procedures.  I would want to discuss this with Dr Burt Knack prior to proceeding with ICD implant should the patient consent in the future.  5. Morbid obesity Body mass index is 40.93 kg/m. Lifestyle modification discussed at length today  6. OSA He is compliant with CPAP  Follow-up:  Return as  needed  Current medicines are reviewed at length with the patient today.   The patient does not have concerns regarding his medicines.  The following changes were made today:  none  Labs/ tests ordered today include:  Orders Placed This Encounter  Procedures  . EKG 12-Lead     Signed, Thompson Grayer, MD  11/19/2018 11:05 AM     [facility] [address] [place] [address] [phone] (office) [phone] (fax)

## 2018-12-05 ENCOUNTER — Ambulatory Visit (INDEPENDENT_AMBULATORY_CARE_PROVIDER_SITE_OTHER): Payer: Medicare Other | Admitting: Podiatry

## 2018-12-05 ENCOUNTER — Encounter: Payer: Self-pay | Admitting: Podiatry

## 2018-12-05 DIAGNOSIS — L84 Corns and callosities: Secondary | ICD-10-CM | POA: Diagnosis not present

## 2018-12-05 DIAGNOSIS — S90129A Contusion of unspecified lesser toe(s) without damage to nail, initial encounter: Secondary | ICD-10-CM

## 2018-12-05 DIAGNOSIS — M79675 Pain in left toe(s): Secondary | ICD-10-CM | POA: Diagnosis not present

## 2018-12-05 DIAGNOSIS — M79674 Pain in right toe(s): Secondary | ICD-10-CM | POA: Diagnosis not present

## 2018-12-05 DIAGNOSIS — E1142 Type 2 diabetes mellitus with diabetic polyneuropathy: Secondary | ICD-10-CM

## 2018-12-05 DIAGNOSIS — B351 Tinea unguium: Secondary | ICD-10-CM

## 2018-12-05 NOTE — Progress Notes (Signed)
Subjective: [name] presents today with history of neuropathy with cc of painful, mycotic toenails.  Pain is aggravated when wearing enclosed shoe gear and relieved with periodic professional debridement.  He has bruise on the left 4th digit. It is not painful and he relates no episodes of trauma.  Patient is on Plavix and Pradaxa.  Marin Olp, MD is his PCP.   Current Outpatient Medications:  .  atorvastatin (LIPITOR) 20 MG tablet, Take 1 tablet (20 mg total) by mouth daily., Disp: 90 tablet, Rfl: 3 .  carvedilol (COREG) 25 MG tablet, TAKE ONE TABLET TWICE DAILY, Disp: 180 tablet, Rfl: 2 .  clopidogrel (PLAVIX) 75 MG tablet, TAKE ONE (1) TABLET BY MOUTH EVERY DAY, Disp: 90 tablet, Rfl: 1 .  fish oil-omega-3 fatty acids 1000 MG capsule, Take 2 g by mouth daily. , Disp: , Rfl:  .  furosemide (LASIX) 40 MG tablet, Take 1 tablet (40 mg total) by mouth daily. Take extra tablet once daily in the PM as needed for weight gain 3 lbs in 24 hrs or swelling., Disp: 30 tablet, Rfl: 11 .  glipiZIDE (GLUCOTROL) 10 MG tablet, TAKE ONE TABLET BY MOUTH TWICE A DAY, Disp: 180 tablet, Rfl: 2 .  glucosamine-chondroitin 500-400 MG tablet, Take 1 tablet by mouth daily as needed (knee flare up or pain)., Disp: , Rfl:  .  glucose blood test strip, Use as instructed to test 4 time daily, Disp: 400 each, Rfl: 3 .  isosorbide mononitrate (IMDUR) 60 MG 24 hr tablet, TAKE ONE (1) TABLET BY MOUTH EVERY DAY, Disp: 90 tablet, Rfl: 3 .  JARDIANCE 10 MG TABS tablet, TAKE ONE (1) TABLET BY MOUTH EVERY OTHER DAY, Disp: 30 tablet, Rfl: 3 .  mesalamine (APRISO) 0.375 g 24 hr capsule, Take 4 capsules (1.5 g total) by mouth daily., Disp: 120 capsule, Rfl: 11 .  metFORMIN (GLUCOPHAGE) 1000 MG tablet, TAKE ONE TABLET BY MOUTH TWICE A DAY WITH A MEAL, Disp: 180 tablet, Rfl: 1 .  PRADAXA 150 MG CAPS capsule, TAKE ONE (1) CAPSULE BY MOUTH TWICE A DAY. (EVERY 12 HOURS.), Disp: 60 capsule, Rfl: 8 .  sacubitril-valsartan  (ENTRESTO) 97-103 MG, Take 1 tablet by mouth 2 (two) times daily., Disp: 60 tablet, Rfl: 6 .  sitaGLIPtin (JANUVIA) 100 MG tablet, TAKE ONE (1) TABLET EACH DAY, Disp: 90 tablet, Rfl: 2 .  spironolactone (ALDACTONE) 25 MG tablet, Take 1 tablet (25 mg total) by mouth daily., Disp: 90 tablet, Rfl: 3 .  SYNTHROID 50 MCG tablet, Take 1 tablet (50 mcg total) by mouth daily before breakfast., Disp: 90 tablet, Rfl: 3  No Known Allergies  Objective:  Vascular Examination: Capillary refill time <3 seconds x 10 digits  Dorsalis pedis faintly palpable b/l   Posterior tibial pulses nonpalpable b/l  Digital hair x 10 digits was sparse  Skin temperature gradient WNL b/l  Dermatological Examination: Skin thin and atrophic b/l  Toenails 1-5 b/l discolored, thick, dystrophic with subungual debris and pain with palpation to nailbeds due to thickness of nails.  Small area of ecchymosis noted dorsal aspect of left 4th digit DIPJ.No edema, no erythema, no drainage, no flocculence.  Hyperkeratotic lesion b/l hallux IPJ. No edema, no erythema, no drainage, no flocculence.  Musculoskeletal: Muscle strength 5/5 to all muscle groups b/l  Neurological: Sensation with 10 gram monofilament is absent b/l  Vibratory sensation absent b/l  Assessment: 1. Painful onychomycosis toenails 1-5 b/l 2. Calluses b/l hallux 3. Spontaneous bruising left 4th toe most  likely due to blood thinner 4. NIDDM with neuropathy  Plan: 1. Toenails 1-5 b/l were debrided in length and girth without iatrogenic bleeding. 2. Monitor left 4th toe for any changes. He is to call if he experiences any further changes. Calluses pared with sterile scalpel blade b/l hallux without incident. 3. Patient to continue soft, supportive shoe gear. 4. Patient to report any pedal injuries to medical professional  5. Follow up 3 months.  6. Patient/POA to call should there be a concern in the interim.

## 2018-12-05 NOTE — Patient Instructions (Signed)

## 2018-12-12 ENCOUNTER — Other Ambulatory Visit: Payer: Self-pay | Admitting: Family Medicine

## 2018-12-13 ENCOUNTER — Other Ambulatory Visit: Payer: Self-pay | Admitting: Internal Medicine

## 2018-12-13 NOTE — Telephone Encounter (Signed)
Please advise on refilling this medication.  ORIGINALLY PRESCRIBED BY DR Yong Channel. NO LONGER PATIENT THERE.THANKS

## 2018-12-13 NOTE — Telephone Encounter (Signed)
RX sent

## 2018-12-13 NOTE — Telephone Encounter (Signed)
Ok to refill for 3 mo with 3 refills

## 2018-12-25 ENCOUNTER — Ambulatory Visit: Payer: Medicare Other | Admitting: Family Medicine

## 2019-01-02 ENCOUNTER — Telehealth: Payer: Self-pay | Admitting: Family Medicine

## 2019-01-02 NOTE — Telephone Encounter (Signed)
Last OV 01/25/2018 Last refill 08/15/2018 #90/1 Next OV not scheduled  Forwarding to Dr. Yong Channel

## 2019-01-02 NOTE — Telephone Encounter (Signed)
Can give #30 and schedule for webex

## 2019-01-02 NOTE — Telephone Encounter (Signed)
Rx has been refilled x 30 days. Called pt and left VM to call the office.

## 2019-01-16 ENCOUNTER — Other Ambulatory Visit: Payer: Self-pay | Admitting: Cardiovascular Disease

## 2019-01-16 ENCOUNTER — Other Ambulatory Visit: Payer: Self-pay | Admitting: Internal Medicine

## 2019-01-16 DIAGNOSIS — E1151 Type 2 diabetes mellitus with diabetic peripheral angiopathy without gangrene: Secondary | ICD-10-CM

## 2019-01-16 NOTE — Telephone Encounter (Signed)
Pt last saw Dr Rayann Heman 11/19/18, last labs 09/12/18 Creat 1.07, age 73, weight 122.1kg, CrCl 107.77, based on CrCl pt is on appropriate dosage of Praxaxa 122m BID.  Will refill rx.

## 2019-01-21 ENCOUNTER — Ambulatory Visit: Payer: Medicare Other | Admitting: Physician Assistant

## 2019-01-23 ENCOUNTER — Telehealth: Payer: Self-pay | Admitting: Cardiovascular Disease

## 2019-01-23 NOTE — Telephone Encounter (Signed)
New message     1. What dental office are you calling from? [facility]  2. What is your office phone number? [phone]  3. What is your fax number?[phone]  4. What type of procedure is the patient having performed? 5 teeth extracted   5. What date is procedure scheduled or is the patient there now? TBD (if the patient is at the dentist's office question goes to their cardiologist if he/she is in the office.  If not, question should go to the DOD).   6. What is your question (ex. Antibiotics prior to procedure, holding medication-we need to know how long dentist wants pt to hold med)? Need to know if any medication needs to withheld prior to extraction and when to be put back on medication after the extraction.

## 2019-01-23 NOTE — Telephone Encounter (Signed)
Pt is on Plavix for CAD and Pradaxa for Afib. I will route this request to [name] for permission to hold plavix for extraction of 5 teeth and to our pharmacist for recommendations on anticoagulation.   [name], please route response back to P [facility] PREOP

## 2019-01-23 NOTE — Telephone Encounter (Signed)
Pt takes Pradaxa for afib with CHADS2VASc score of 7 (age, HTN, CHF, DM, CAD, TIA). Renal function is normal. Recommend only holding Pradaxa for 1 day due to elevated cardiac risk including history of TIA.

## 2019-01-23 NOTE — Telephone Encounter (Signed)
Agree with Megan. Regarding plavix, would hold x 3 days prior to dental extraction if dentist is ok with that plan. Should start back on plavix asap following extraction.

## 2019-01-24 NOTE — Telephone Encounter (Signed)
   Primary Cardiologist: Sherren Mocha, MD  Chart reviewed as part of pre-operative protocol coverage. Patient was contacted 01/24/2019 in reference to pre-operative risk assessment for pending surgery as outlined below.  [name] was last seen on 11/19/2018 by Dr. Rayann Heman.  Since that day, [name] has done well. He has CAD and ischemic cardiomyopathy with EF 25-30%. He is currently stable on medical therapy.   Therefore, based on ACC/AHA guidelines, the patient would be at acceptable risk for the planned procedure without further cardiovascular testing.   It is generally accepted that for simple extractions and dental cleanings, there is no need to interrupt blood thinner therapy. This usually includes up to 2 simple extractions. If the 5 teeth are to be extracted at once then per [name], Plavix could be held for 3 days if Dr. Luan Pulling is OK with that. Restart Plavix ASAP following extraction.   Pt takes Pradaxa for afib with CHADS2VASc score of 7 (age, HTN, CHF, DM, CAD, TIA). Renal function is normal. Recommend only holding Pradaxa, if needed, for 1 day due to elevated cardiac risk including history of TIA.   Would also limit the use of epinephrine to only what is necessary.   I will route this recommendation to the requesting party via Epic fax function and remove from pre-op pool.  Please call with questions.  Daune Perch, NP 01/24/2019, 10:25 AM

## 2019-01-28 NOTE — Telephone Encounter (Signed)
New message   Patient has decided to only have 2 teeth extracted and the office wants to know how long do need to wait to come off both medications before we take out the next three teeth since the patient wants to split up the time for each visit?

## 2019-01-28 NOTE — Telephone Encounter (Signed)
   Primary Cardiologist: Sherren Mocha, MD  Chart reviewed as part of pre-operative protocol coverage.  I returned call to requesting office to clarify medications. The original clearance outlined holding plavix for 3 days and holding pradaxa for 1 day prior to extraction of 2 or more teeth. The patient now wants to break up his extractions into two different visits, requiring him to interrupt therapy twice.  It would be reasonable for the patient to return to his normal medication regimen for at least 7 days prior to interrupting anticoagulation and antiplatelet therapy again. After consultation with requesting office, he will have two teeth extracted on Wed 01/30/19 with resumption of plavix and pradaxa that night. He will then plan to stop plavix again, last dose Friday 02/08/19 and last dose of pradaxa on 02/10/19 for second round of extractions on Monday 02/11/19.  I will route this recommendation to the requesting party via Epic fax function and remove from pre-op pool.  Please call with questions.   Cook, PA 01/28/2019, 4:01 PM

## 2019-01-31 ENCOUNTER — Ambulatory Visit: Payer: Medicare Other

## 2019-02-05 ENCOUNTER — Ambulatory Visit: Payer: Medicare Other | Admitting: Nutrition

## 2019-02-12 ENCOUNTER — Other Ambulatory Visit (HOSPITAL_COMMUNITY): Payer: Self-pay

## 2019-02-12 MED ORDER — SACUBITRIL-VALSARTAN 97-103 MG PO TABS: 1.0000 | ORAL_TABLET | Freq: Two times a day (BID) | ORAL | 6 refills | Status: DC

## 2019-02-25 ENCOUNTER — Other Ambulatory Visit: Payer: Self-pay

## 2019-02-27 ENCOUNTER — Other Ambulatory Visit: Payer: Self-pay

## 2019-02-27 ENCOUNTER — Ambulatory Visit (INDEPENDENT_AMBULATORY_CARE_PROVIDER_SITE_OTHER): Payer: Medicare Other | Admitting: Internal Medicine

## 2019-02-27 ENCOUNTER — Encounter: Payer: Self-pay | Admitting: Internal Medicine

## 2019-02-27 VITALS — BP 128/82 | HR 75 | Ht 68.0 in | Wt 268.0 lb

## 2019-02-27 DIAGNOSIS — I255 Ischemic cardiomyopathy: Secondary | ICD-10-CM | POA: Diagnosis not present

## 2019-02-27 DIAGNOSIS — E1151 Type 2 diabetes mellitus with diabetic peripheral angiopathy without gangrene: Secondary | ICD-10-CM

## 2019-02-27 DIAGNOSIS — E039 Hypothyroidism, unspecified: Secondary | ICD-10-CM

## 2019-02-27 DIAGNOSIS — E785 Hyperlipidemia, unspecified: Secondary | ICD-10-CM

## 2019-02-27 LAB — LIPID PANEL
Cholesterol: 81 mg/dL (ref 0–200)
HDL: 49.2 mg/dL (ref >39.00)
LDL Cholesterol: 1 mg/dL (ref 0–99)
NonHDL: 31.65
Total CHOL/HDL Ratio: 2
Triglycerides: 152 mg/dL — ABNORMAL HIGH (ref 0.0–149.0)
VLDL: 30.4 mg/dL (ref 0.0–40.0)

## 2019-02-27 LAB — MICROALBUMIN / CREATININE URINE RATIO
Creatinine,U: 93.2 mg/dL
Microalb Creat Ratio: 2.4 mg/g (ref 0.0–30.0)
Microalb, Ur: 2.2 mg/dL — ABNORMAL HIGH (ref 0.0–1.9)

## 2019-02-27 LAB — T4, FREE: Free T4: 1.36 ng/dL (ref 0.60–1.60)

## 2019-02-27 LAB — TSH: TSH: 0.83 u[IU]/mL (ref 0.35–4.50)

## 2019-02-27 MED ORDER — EMPAGLIFLOZIN 10 MG PO TABS: ORAL_TABLET | ORAL | 3 refills | Status: DC

## 2019-02-27 NOTE — Patient Instructions (Signed)
Please continue: - Glipizide 10 mg 2x a day - Metformin 1000 mg 2x a day - Januvia 100 mg daily   Try to increase: - Jardiance 10 mg in a.m. to every day  Check with your insurance if they cover Ozempic or Trulicity.  Please return in 4 months with your sugar log.

## 2019-02-27 NOTE — Progress Notes (Signed)
Patient ID: [name], male   DOB: December 14, 1945, 73 y.o.   MRN: [id number]  HPI: [name] is a 73 y.o.-year-old male, returning for DM2, dx 2004, non-insulin-dependent, uncontrolled, with complications (CAD, s/p MI). Last visit 4 months ago. He has [facility] as supplemental and [employer] for meds in 10/2015.  Last hemoglobin A1c was: 10/24/2018: HbA1c calculated from fructosamine is slightly higher than the one from 02/2018, at 6.6%. 02/13/2018: The HbA1c calculated from the fructosamine is excellent, at 6.3%! 10/16/2017: HbA1c calculated from fructosamine is: 7.3% (higher). 06/16/2017: HbA1c calculated from the fructosamine is stable, at 6.8%.  12/08/2016: HbA1c calculated from the fructosamine is better, at 6.86%. 08/10/2016: HbA1c calculated from  fructosamine is 7.88% 05/10/2016: HbA1c calculated from fructosamine is 7.6% 01/06/2016: HbA1c calculated from fructosamine is 6.6% 09/06/2016: HbA1c calculated from fructosamine is 7.0% 06/01/2015: HbA1c calculated from fructosamine is 7.0% 02/23/2015: HbA1c calculated from fructosamine is 6.88% 10/15/2014: HbA1c calculated from fructosamine is 7.17% Lab Results  Component Value Date   HGBA1C 7.7 (A) 10/24/2018   HGBA1C 8.8 06/16/2017   HGBA1C 9.3 03/10/2017  He started to change his eating habits since 03/2013.   He is on: - Glipizide 10 mg 2x a day - Metformin 1000 mg 2x a day - Januvia 100 mg daily  - Jardiance 10 mg in a.m., every other day due to increased urination He does not miss doses.  Pt checks his sugars 4x a day: - am: 112-159 >> 110-138 >> 110-142 - 2h after breakfast: 126-170 >> 142-151 >>  136-165 - before lunch 144 >> 110-120s >> n/c  - before dinner:  102-137 >> 113-138 >> 104-129 - 2h after dinner: 128-150 >> 136-155 >> 133-164 He has hypoglycemia awareness in the 60s Highest: 287 >> 170 >> 155 >> 165.  His wife is a Radiographer, therapeutic >> he does eat sweets. He saw [name] (nutritionist).  -+ mild CKD,  last BUN/creatinine:  Lab Results  Component Value Date   BUN 12 09/12/2018   CREATININE 1.07 09/12/2018  On Entresto.  -+ HL:  last set of lipids: Lab Results  Component Value Date   CHOL 74 (L) 02/07/2018   HDL 45 02/07/2018   LDLCALC 13 02/07/2018   LDLDIRECT 18.0 06/13/2017   TRIG 82 02/07/2018   CHOLHDL 1.6 02/07/2018  He was in the Reveal Study x 4 years >> stopped. On Lipitor + omega-3 fatty acids.  - last eye exam was 10/2017: No DR (Dr. Gershon [name]).  + Mild cataracts.  - No numbness and tingling in his feet.  He also has a history of OSA- compliant with CPAP, A. Fib-on Plavix and pradaxa, IBD, GERD, vitamin B12 deficiency - previously on im B12, also, obesity.  In 2019 she was also diagnosed with CHF -EF of 25 to 30%.  His Lasix was increased to twice a day(takes the second dose as needed). Cardiac cath >> no significant obstruction.  He was started on Entresto.    Hypothyroidism:  Pt is on levothyroxine 50 mcg daily, taken: - in am - fasting - at least 30 min from b'fast (previously 10 to 30 minutes before breakfast) - no Ca, Fe, MVI, PPIs - not on Biotin  Latest TSH was normal: Lab Results  Component Value Date   TSH 0.86 06/13/2017   Pt denies: - feeling nodules in neck - hoarseness - dysphagia - choking - SOB with lying down  ROS: Constitutional: no weight gain/no weight loss, no fatigue, no subjective hyperthermia, no subjective hypothermia  Eyes: no blurry vision, no xerophthalmia ENT: no sore throat, + see HPI Cardiovascular: no CP/no SOB/no palpitations/no leg swelling Respiratory: no cough/no SOB/no wheezing Gastrointestinal: no N/no V/no D/no C/no acid reflux Musculoskeletal: no muscle aches/no joint aches Skin: no rashes, no hair loss Neurological: no tremors/no numbness/no tingling/no dizziness  I reviewed pt's medications, allergies, PMH, social hx, family hx, and changes were documented in the history of present illness. Otherwise,  unchanged from my initial visit note.  Past Medical History:  Diagnosis Date  . Atrial fibrillation ([place])    initial diagnoses 2012  . B12 deficiency    per patient previously taking shots  . CORONARY ARTERY DISEASE 04/12/2007   2 stents last in 2006.   Marland Kitchen DIABETES MELLITUS, TYPE II 04/16/2007  . Diverticulosis of colon (without mention of hemorrhage)   . HYPERLIPIDEMIA 04/16/2007   reveal study. not sure of medication  . HYPERTENSION 04/12/2007  . HYPOTHYROIDISM 04/12/2007  . Ischemic cardiomyopathy    cath 35-40%  . MYOCARDIAL INFARCTION, HX OF 04/12/2007  . OBESITY 06/16/2009  . SLEEP APNEA 04/12/2007   CPAP  . Stroke ([place])   . ULCERATIVE COLITIS, LEFT SIDED 11/23/2010   Past Surgical History:  Procedure Laterality Date  . CARDIAC CATHETERIZATION  10/2002   STENT. 2 stents Dr. Maurene [name]  . CARDIAC CATHETERIZATION N/A 10/28/2015   Procedure: Right/Left Heart Cath and Coronary Angiography;  Surgeon: Andrew Mocha, MD;  Location: [facility];  Service: Cardiovascular;  Laterality: N/A;  . CORONARY STENT PLACEMENT  2008   LAD   . RIGHT/LEFT HEART CATH AND CORONARY ANGIOGRAPHY N/A 06/01/2018   Procedure: RIGHT/LEFT HEART CATH AND CORONARY ANGIOGRAPHY;  Surgeon: Andrew Mocha, MD;  Location: [facility];  Service: Cardiovascular;  Laterality: N/A;  . TONSILLECTOMY     Social History   Socioeconomic History  . Marital status: Married    Spouse name: Not on file  . Number of children: 1  . Years of education: Not on file  . Highest education level: Not on file  Occupational History  . Occupation: RETIRED    Employer: RETIRED  Social Needs  . Financial resource strain: Not on file  . Food insecurity:    Worry: Not on file    Inability: Not on file  . Transportation needs:    Medical: Not on file    Non-medical: Not on file  Tobacco Use  . Smoking status: Former Smoker    Packs/day: 1.00    Years: 5.00    Pack years: 5.00    Types: Cigarettes    Last attempt to quit:  04/13/1966    Years since quitting: 52.9  . Smokeless tobacco: Never Used  Substance and Sexual Activity  . Alcohol use: No    Alcohol/week: 0.0 standard drinks    Comment: no more beer  . Drug use: No  . Sexual activity: Not on file  Lifestyle  . Physical activity:    Days per week: Not on file    Minutes per session: Not on file  . Stress: Not on file  Relationships  . Social connections:    Talks on phone: Not on file    Gets together: Not on file    Attends religious service: Not on file    Active member of club or organization: Not on file    Attends meetings of clubs or organizations: Not on file    Relationship status: Not on file  . Intimate partner violence:    Fear  of current or ex partner: Not on file    Emotionally abused: Not on file    Physically abused: Not on file    Forced sexual activity: Not on file  Other Topics Concern  . Not on file  Social History Narrative   Cardiorehab 3 days a week. 45 minutes to an hour-stationary bike.    GRANDDAUGHTER (MS. 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       Married for 37 years in 2015, married previously for 14 years. Daughter with first wife and 3 grandkids.    Lives alone with wife. Get to see grandkids a lot. New grandchild in middle of 85      Hobbies-yardwork, previously liked to hunt and fish, does some target shooting   Current Outpatient Medications on File Prior to Visit  Medication Sig Dispense Refill  . atorvastatin (LIPITOR) 20 MG tablet Take 1 tablet (20 mg total) by mouth daily. 90 tablet 3  . carvedilol (COREG) 25 MG tablet TAKE ONE TABLET TWICE DAILY 180 tablet 2  . clopidogrel (PLAVIX) 75 MG tablet TAKE ONE (1) TABLET BY MOUTH EVERY DAY 30 tablet 0  . fish oil-omega-3 fatty acids 1000 MG capsule Take 2 g by mouth daily.     . furosemide (LASIX) 40 MG tablet Take 1 tablet (40 mg total) by mouth  daily. Take extra tablet once daily in the PM as needed for weight gain 3 lbs in 24 hrs or swelling. 30 tablet 11  . glipiZIDE (GLUCOTROL) 10 MG tablet TAKE ONE TABLET BY MOUTH TWICE A DAY 180 tablet 2  . glucosamine-chondroitin 500-400 MG tablet Take 1 tablet by mouth daily as needed (knee flare up or pain).    Marland Kitchen glucose blood test strip Use as instructed to test 4 time daily 400 each 3  . isosorbide mononitrate (IMDUR) 60 MG 24 hr tablet TAKE ONE (1) TABLET BY MOUTH EVERY DAY 90 tablet 3  . JARDIANCE 10 MG TABS tablet TAKE ONE (1) TABLET BY MOUTH EVERY OTHER DAY 30 tablet 3  . mesalamine (APRISO) 0.375 g 24 hr capsule Take 4 capsules (1.5 g total) by mouth daily. 120 capsule 11  . metFORMIN (GLUCOPHAGE) 1000 MG tablet TAKE ONE TABLET BY MOUTH TWICE A DAY WITH A MEAL 180 tablet 1  . PRADAXA 150 MG CAPS capsule TAKE ONE (1) CAPSULE BY MOUTH TWICE A DAY. (EVERY 12 HOURS.) (EVERY 12 HOURS) 60 capsule 6  . sacubitril-valsartan (ENTRESTO) 97-103 MG Take 1 tablet by mouth 2 (two) times daily. 60 tablet 6  . sitaGLIPtin (JANUVIA) 100 MG tablet TAKE ONE (1) TABLET BY MOUTH EVERY DAY 90 tablet 2  . spironolactone (ALDACTONE) 25 MG tablet Take 1 tablet (25 mg total) by mouth daily. 90 tablet 3  . SYNTHROID 50 MCG tablet TAKE 1 TABLET (50 MG TOTAL) BY MOUTH DAILY BEFORE BREAKFAST 90 tablet 3   No current facility-administered medications on file prior to visit.    No Known Allergies Family History  Problem Relation Age of Onset  . Heart attack Mother        mid 30s  . Heart disease Father        H/O CAD, CABG, VALVE SURGERY  . Hypertension Brother   . Obesity Brother   . Stomach cancer Maternal Aunt   . Stomach cancer Paternal Grandmother        ?  colon     PE: There were no vitals taken for this visit. There is no height or weight on file to calculate BMI.  Wt Readings from Last 3 Encounters:  11/19/18 269 lb 3.2 oz (122.1 kg)  10/30/18 280 lb (127 kg)  10/24/18 273 lb (123.8 kg)    Constitutional: overweight, in NAD Eyes: PERRLA, EOMI, no exophthalmos ENT: moist mucous membranes, no thyromegaly, no cervical lymphadenopathy Cardiovascular: RRR, No MRG, + B LE edema L>R Respiratory: CTA B Gastrointestinal: abdomen soft, NT, ND, BS+ Musculoskeletal: no deformities, strength intact in all 4 Skin: moist, warm, no rashes Neurological: no tremor with outstretched hands, DTR normal in all 4  ASSESSMENT: 1. DM2, non-insulin-dependent, uncontrolled, with complications - CAD, s/p MI 10/2006 - s/p stent - sees [name]  2. Obesity class 3 BMI Classification:  < 18.5 underweight   18.5-24.9 normal weight   25.0-29.9 overweight   30.0-34.9 class I obesity   35.0-39.9 class II obesity   ? 40.0 class III obesity   3. HL  4.  Hypothyroidism  PLAN:  1. Patient with longstanding, fairly well-controlled diabetes, on oral antidiabetic regimen.  We have him on Jardiance low-dose, every other day due to increased urination.  At this visit, sugars are slightly higher is he is mostly at home and not active, snacking frequently.  We discussed about adding a GLP-1 receptor agonist, but it is not clear whether this is covered by his insurance.  For now, we decided to increase Jardiance to every day (he will retry this) and I gave him options for GLP-1 receptor agonists so that he can check with his insurance whether these are covered. - I advised him to Patient Instructions  Please continue: - Glipizide 10 mg 2x a day - Metformin 1000 mg 2x a day - Januvia 100 mg daily   Try to increase: - Jardiance 10 mg in a.m. to every day  Check with your insurance if they cover Ozempic or Trulicity.  Please return in 4 months with your sugar log.   - today, we will check a fructosamine level - continue checking sugars at different times of the day - check 1x a day, rotating checks - advised for yearly eye exams >> he is not UTD - check annual labs today - Return to clinic in  3 mo with sugar log       2. Obesity class 3 -Continue Jardiance which should also help with weight loss >> we will increase this to daily dosing  3. HL - Reviewed latest lipid panel from 02/2018: Excellent Lab Results  Component Value Date   CHOL 74 (L) 02/07/2018   HDL 45 02/07/2018   LDLCALC 13 02/07/2018   LDLDIRECT 18.0 06/13/2017   TRIG 82 02/07/2018   CHOLHDL 1.6 02/07/2018  - Continues Lipitor and omega-3 fatty acids without side effects.  4.  Hypothyroidism -- latest thyroid labs reviewed with pt >> normal 2018.  We did not check his test at last visit as he was not taking correctly then.  He was only separating this by 10 to 30 minutes from breakfast.  I advised him to move breakfast at least 30 minutes from levothyroxine - he continues on LT4 50 mcg daily - pt feels good on this dose. - we discussed about taking the thyroid hormone every day, with water, >30 minutes before breakfast, separated by >4 hours from acid reflux medications, calcium, iron, multivitamins. Pt. is taking it correctly. - will check thyroid tests today:  TSH and fT4 - If labs are abnormal, he will need to return for repeat TFTs in 1.5 months  Component     Latest Ref Rng & Units 02/27/2019          Glucose     65 - 99 mg/dL 115 (H)  BUN     7 - 25 mg/dL 15  Creatinine     0.70 - 1.18 mg/dL 1.10  GFR, Est Non African American     > OR = 60 mL/min/1.47m 66  GFR, Est African American     > OR = 60 mL/min/1.727m77  BUN/Creatinine Ratio     6 - 22 (calc) NOT APPLICABLE  Sodium     13086 146 mmol/L 141  Potassium     3.5 - 5.3 mmol/L 4.3  Chloride     98 - 110 mmol/L 103  CO2     20 - 32 mmol/L 26  Calcium     8.6 - 10.3 mg/dL 9.2  Total Protein     6.1 - 8.1 g/dL 6.5  Albumin MSPROF     3.6 - 5.1 g/dL 4.1  Globulin     1.9 - 3.7 g/dL (calc) 2.4  AG Ratio     1.0 - 2.5 (calc) 1.7  Total Bilirubin     0.2 - 1.2 mg/dL 0.7  Alkaline phosphatase (APISO)     35 - 144 U/L 65  AST      10 - 35 U/L 13  ALT     9 - 46 U/L 7 (L)  Cholesterol     0 - 200 mg/dL 81  Triglycerides     0.0 - 149.0 mg/dL 152.0 (H)  HDL Cholesterol     >39.00 mg/dL 49.20  VLDL     0.0 - 40.0 mg/dL 30.4  LDL (calc)     0 - 99 mg/dL 1  Total CHOL/HDL Ratio      2  NonHDL      31.65  Microalb, Ur     0.0 - 1.9 mg/dL 2.2 (H)  Creatinine,U     mg/dL 93.2  MICROALB/CREAT RATIO     0.0 - 30.0 mg/g 2.4  TSH     0.35 - 4.50 uIU/mL 0.83  Fructosamine     205 - 285 umol/L 285  T4,Free(Direct)     0.60 - 1.60 ng/dL 1.36   Labs are very good. HbA1c calculated from fructosamine is better: 6.45%.  CrPhilemon KingdomMD PhD [facility]

## 2019-02-28 ENCOUNTER — Encounter: Payer: Self-pay | Admitting: Family Medicine

## 2019-02-28 ENCOUNTER — Other Ambulatory Visit: Payer: Self-pay | Admitting: Cardiovascular Disease

## 2019-02-28 ENCOUNTER — Ambulatory Visit (INDEPENDENT_AMBULATORY_CARE_PROVIDER_SITE_OTHER): Payer: Medicare Other | Admitting: Family Medicine

## 2019-02-28 ENCOUNTER — Other Ambulatory Visit: Payer: Self-pay | Admitting: Internal Medicine

## 2019-02-28 VITALS — BP 110/70 | HR 85 | Temp 98.0°F | Resp 18 | Ht 68.0 in | Wt 268.0 lb

## 2019-02-28 DIAGNOSIS — K5100 Ulcerative (chronic) pancolitis without complications: Secondary | ICD-10-CM | POA: Diagnosis not present

## 2019-02-28 DIAGNOSIS — I2510 Atherosclerotic heart disease of native coronary artery without angina pectoris: Secondary | ICD-10-CM | POA: Diagnosis not present

## 2019-02-28 DIAGNOSIS — I4891 Unspecified atrial fibrillation: Secondary | ICD-10-CM | POA: Diagnosis not present

## 2019-02-28 DIAGNOSIS — I10 Essential (primary) hypertension: Secondary | ICD-10-CM

## 2019-02-28 DIAGNOSIS — E785 Hyperlipidemia, unspecified: Secondary | ICD-10-CM | POA: Diagnosis not present

## 2019-02-28 DIAGNOSIS — I255 Ischemic cardiomyopathy: Secondary | ICD-10-CM | POA: Diagnosis not present

## 2019-02-28 MED ORDER — CLOPIDOGREL BISULFATE 75 MG PO TABS: 75.0000 mg | ORAL_TABLET | Freq: Every day | ORAL | 3 refills | Status: DC

## 2019-02-28 NOTE — Progress Notes (Signed)
Phone [phone]   Subjective:  Virtual visit via Video note. Chief complaint: Chief Complaint  Patient presents with  . Hypertension  . Diabetes   This visit type was conducted due to national recommendations for restrictions regarding the COVID-19 Pandemic (e.g. social distancing).  This format is felt to be most appropriate for this patient at this time balancing risks to patient and risks to population by having him in for in person visit.  No physical exam was performed (except for noted visual exam or audio findings with Telehealth visits).    Our team/I connected with [name] at  3:00 PM EDT by a video enabled telemedicine application ([email or web address] or caregility through epic) and verified that I am speaking with the correct person using two identifiers.  Location patient: [facility] Location provider: [facility], office Persons participating in the virtual visit:  Patient, granddaughter who is a nurse helps with meds  Our team/I discussed the limitations of evaluation and management by telemedicine and the availability of in person appointments. In light of current covid-19 pandemic, patient also understands that we are trying to protect them by minimizing in office contact if at all possible.  The patient expressed consent for telemedicine visit and agreed to proceed. Patient understands insurance will be billed.   ROS- No chest pain or shortness of breath. No headache or blurry vision.   Past Medical History-  Patient Active Problem List   Diagnosis Date Noted  . History of transient ischemic attack (TIA) 10/24/2015    Priority: High  . CHF (congestive heart failure), NYHA class II ([place]) 05/09/2014    Priority: High  . Ulcerative pancolitis without complication ([place]) 11/91/4782    Priority: High  . Atrial fibrillation ([place]) 12/08/2010    Priority: High  . Type II diabetes mellitus with peripheral circulatory disorder ([place]) 04/16/2007    Priority: High  . CAD (coronary  artery disease), native coronary artery 04/12/2007    Priority: High  . Morbid obesity ([place]) 08/09/2011    Priority: Medium  . Hyperlipidemia 04/16/2007    Priority: Medium  . Hypothyroidism 04/12/2007    Priority: Medium  . Essential hypertension 04/12/2007    Priority: Medium  . OSA (obstructive sleep apnea) 04/12/2007    Priority: Medium  . Former smoker 08/11/2016    Priority: Low  . MCI (mild cognitive impairment) 01/19/2016    Priority: Low  . Primary osteoarthritis of left knee 02/09/2015    Priority: Low  . Allergic rhinitis 06/25/2012    Priority: Low  . GERD (gastroesophageal reflux disease) 08/09/2011    Priority: Low  . VITAMIN B12 DEFICIENCY 11/29/2010    Priority: Low  . Acute on chronic combined systolic and diastolic CHF (congestive heart failure) ([place]) 02/07/2018    Medications- reviewed and updated Current Outpatient Medications  Medication Sig Dispense Refill  . atorvastatin (LIPITOR) 20 MG tablet Take 1 tablet (20 mg total) by mouth daily. 90 tablet 3  . carvedilol (COREG) 25 MG tablet TAKE ONE TABLET TWICE DAILY 180 tablet 2  . clopidogrel (PLAVIX) 75 MG tablet Take 1 tablet (75 mg total) by mouth daily. 90 tablet 3  . empagliflozin (JARDIANCE) 10 MG TABS tablet TAKE ONE (1) TABLET BY MOUTH EVERY DAY 90 tablet 3  . fish oil-omega-3 fatty acids 1000 MG capsule Take 2 g by mouth daily.     . furosemide (LASIX) 40 MG tablet Take 1 tablet (40 mg total) by mouth daily. Take extra tablet once daily in the PM as  needed for weight gain 3 lbs in 24 hrs or swelling. 30 tablet 11  . glipiZIDE (GLUCOTROL) 10 MG tablet TAKE ONE TABLET BY MOUTH TWICE A DAY 180 tablet 2  . glucosamine-chondroitin 500-400 MG tablet Take 1 tablet by mouth daily as needed (knee flare up or pain).    Marland Kitchen glucose blood test strip Use as instructed to test 4 time daily 400 each 3  . isosorbide mononitrate (IMDUR) 60 MG 24 hr tablet TAKE ONE (1) TABLET BY MOUTH EVERY DAY 90 tablet 3  . mesalamine  (APRISO) 0.375 g 24 hr capsule Take 4 capsules (1.5 g total) by mouth daily. 120 capsule 11  . metFORMIN (GLUCOPHAGE) 1000 MG tablet TAKE ONE TABLET BY MOUTH TWICE A DAY WITH A MEAL 180 tablet 1  . PRADAXA 150 MG CAPS capsule TAKE ONE (1) CAPSULE BY MOUTH TWICE A DAY. (EVERY 12 HOURS.) (EVERY 12 HOURS) 60 capsule 6  . sacubitril-valsartan (ENTRESTO) 97-103 MG Take 1 tablet by mouth 2 (two) times daily. 60 tablet 6  . sitaGLIPtin (JANUVIA) 100 MG tablet TAKE ONE (1) TABLET BY MOUTH EVERY DAY 90 tablet 2  . spironolactone (ALDACTONE) 25 MG tablet Take 1 tablet (25 mg total) by mouth daily. 90 tablet 3  . SYNTHROID 50 MCG tablet TAKE 1 TABLET (50 MG TOTAL) BY MOUTH DAILY BEFORE BREAKFAST 90 tablet 3   No current facility-administered medications for this visit.      Objective:  BP 110/70   Pulse 85   Temp 98 F (36.7 C) (Oral)   Resp 18   Ht 5' 8"  (1.727 m)   Wt 268 lb (121.6 kg)   SpO2 98%   BMI 40.75 kg/m  self reported vitals Gen: NAD, resting comfortably Lungs: nonlabored, normal respiratory rate  Skin: appears dry, no obvious rash    Assessment and Plan   #hyperlipidemia/CAD/atrial fibrillation S:  controlled on atorvastatin 33m  I have prescribed his Plavix-with his CAD history.  He is also on Pradaxa for anticoagulation due to atrial fibrillation.  Patient reports cardiology wants him on both medications.  Patient is not on rate control medication Lab Results  Component Value Date   CHOL 81 02/27/2019   HDL 49.20 02/27/2019   LDLCALC 1 02/27/2019   LDLDIRECT 18.0 06/13/2017   TRIG 152.0 (H) 02/27/2019   CHOLHDL 2 02/27/2019   A/P: Hyperlipidemia-stable continue atorvastatin.  CAD-stable-continue Plavix.  Continue follow-up with Dr. ARayann Heman  #hypertension S: controlled on Coreg 25 mg twice a day, Imdur 60 mg, Entresto, spironolactone 25 mg. BP Readings from Last 3 Encounters:  02/28/19 110/70  02/27/19 128/82  11/19/18 118/76  A/P:  Stable. Continue current  medications.    #Morbid obesity S:weight trouble with virus- too much couch time. Going to get back in his shop.   A/P: Poor control-discussed importance of weight loss- Encouraged need for healthy eating, regular exercise, weight loss.    % Ulcerative colitis S: Followed by GI- compliant with mesalamine A/P:  Stable. Continue current medications.       Other notes: 1.he is taking jardiance daily through Dr. GSherwood Gamblerfeels he is doing very well with this 2.  Thyroid was evaluated by Dr. GCruzita Ledererand was stable-Synthroid was continued.  I would be happy to refill this if needed  6 month follow up advised-hopefully get a flu shot at that time or he has it before that visit Future Appointments  Date Time Provider [place] 07/03/2019 10:15 AM GPhilemon Kingdom MD [facility] None  08/26/2019 11:00 AM Marin Olp, MD [facility]    Lab/Order associations: Essential hypertension  Hyperlipidemia, unspecified hyperlipidemia type  Coronary artery disease involving native coronary artery of native heart without angina pectoris  Atrial fibrillation, unspecified type ([place])  Ulcerative pancolitis without complication ([place]), Chronic  Morbid obesity ([place])  Meds ordered this encounter  Medications  . clopidogrel (PLAVIX) 75 MG tablet    Sig: Take 1 tablet (75 mg total) by mouth daily.    Dispense:  90 tablet    Refill:  3   Return precautions advised.  Garret Reddish, MD

## 2019-02-28 NOTE — Patient Instructions (Addendum)
Health Maintenance Due  Topic Date Due  . FOOT EXAM Postponed until future in office visit  01/26/2019    Depression screen [facility] 2/9 05/28/2018 01/25/2018 08/11/2016  Decreased Interest 0 0 0  Down, Depressed, Hopeless 0 0 0  PHQ - 2 Score 0 0 0  Some recent data might be hidden

## 2019-03-02 LAB — COMPLETE METABOLIC PANEL WITH GFR
AG Ratio: 1.7 (calc) (ref 1.0–2.5)
ALT: 7 U/L — ABNORMAL LOW (ref 9–46)
AST: 13 U/L (ref 10–35)
Albumin: 4.1 g/dL (ref 3.6–5.1)
Alkaline phosphatase (APISO): 65 U/L (ref 35–144)
BUN: 15 mg/dL (ref 7–25)
CO2: 26 mmol/L (ref 20–32)
Calcium: 9.2 mg/dL (ref 8.6–10.3)
Chloride: 103 mmol/L (ref 98–110)
Creat: 1.1 mg/dL (ref 0.70–1.18)
GFR, Est African American: 77 mL/min/{1.73_m2} (ref >=60)
GFR, Est Non African American: 66 mL/min/{1.73_m2} (ref >=60)
Globulin: 2.4 g/dL (calc) (ref 1.9–3.7)
Glucose, Bld: 115 mg/dL — ABNORMAL HIGH (ref 65–99)
Potassium: 4.3 mmol/L (ref 3.5–5.3)
Sodium: 141 mmol/L (ref 135–146)
Total Bilirubin: 0.7 mg/dL (ref 0.2–1.2)
Total Protein: 6.5 g/dL (ref 6.1–8.1)

## 2019-03-02 LAB — FRUCTOSAMINE: Fructosamine: 285 umol/L (ref 205–285)

## 2019-03-06 ENCOUNTER — Ambulatory Visit: Payer: Medicare Other | Admitting: Podiatry

## 2019-03-28 ENCOUNTER — Other Ambulatory Visit: Payer: Self-pay | Admitting: Cardiovascular Disease

## 2019-03-28 DIAGNOSIS — E785 Hyperlipidemia, unspecified: Secondary | ICD-10-CM

## 2019-04-11 ENCOUNTER — Other Ambulatory Visit: Payer: Self-pay | Admitting: Cardiovascular Disease

## 2019-04-15 ENCOUNTER — Telehealth: Payer: Self-pay | Admitting: Family Medicine

## 2019-04-15 ENCOUNTER — Ambulatory Visit: Payer: Medicare Other | Admitting: Family Medicine

## 2019-04-15 ENCOUNTER — Telehealth: Payer: Self-pay | Admitting: Gastroenterology

## 2019-04-15 ENCOUNTER — Telehealth: Payer: Self-pay

## 2019-04-15 ENCOUNTER — Encounter: Payer: Self-pay | Admitting: *Deleted

## 2019-04-15 ENCOUNTER — Encounter (HOSPITAL_COMMUNITY): Payer: Self-pay

## 2019-04-15 ENCOUNTER — Telehealth: Payer: Self-pay | Admitting: General Surgery

## 2019-04-15 ENCOUNTER — Inpatient Hospital Stay (HOSPITAL_COMMUNITY)
Admission: EM | Admit: 2019-04-15 | Discharge: 2019-04-18 | DRG: 372 | Disposition: A | Discharge status: Home / Self Care | Payer: Medicare Other | Attending: Internal Medicine | Admitting: Internal Medicine

## 2019-04-15 ENCOUNTER — Telehealth: Payer: Self-pay | Admitting: *Deleted

## 2019-04-15 ENCOUNTER — Other Ambulatory Visit: Payer: Self-pay

## 2019-04-15 ENCOUNTER — Other Ambulatory Visit (INDEPENDENT_AMBULATORY_CARE_PROVIDER_SITE_OTHER): Payer: Medicare Other

## 2019-04-15 DIAGNOSIS — K5100 Ulcerative (chronic) pancolitis without complications: Secondary | ICD-10-CM

## 2019-04-15 DIAGNOSIS — G4733 Obstructive sleep apnea (adult) (pediatric): Secondary | ICD-10-CM | POA: Diagnosis present

## 2019-04-15 DIAGNOSIS — I4821 Permanent atrial fibrillation: Secondary | ICD-10-CM | POA: Diagnosis not present

## 2019-04-15 DIAGNOSIS — Z1159 Encounter for screening for other viral diseases: Secondary | ICD-10-CM | POA: Diagnosis not present

## 2019-04-15 DIAGNOSIS — I110 Hypertensive heart disease with heart failure: Secondary | ICD-10-CM | POA: Diagnosis present

## 2019-04-15 DIAGNOSIS — R197 Diarrhea, unspecified: Secondary | ICD-10-CM | POA: Diagnosis not present

## 2019-04-15 DIAGNOSIS — I252 Old myocardial infarction: Secondary | ICD-10-CM

## 2019-04-15 DIAGNOSIS — Z7984 Long term (current) use of oral hypoglycemic drugs: Secondary | ICD-10-CM

## 2019-04-15 DIAGNOSIS — I4891 Unspecified atrial fibrillation: Secondary | ICD-10-CM | POA: Diagnosis not present

## 2019-04-15 DIAGNOSIS — E039 Hypothyroidism, unspecified: Secondary | ICD-10-CM | POA: Diagnosis present

## 2019-04-15 DIAGNOSIS — Z6838 Body mass index (BMI) 38.0-38.9, adult: Secondary | ICD-10-CM

## 2019-04-15 DIAGNOSIS — E876 Hypokalemia: Secondary | ICD-10-CM | POA: Diagnosis present

## 2019-04-15 DIAGNOSIS — Z79899 Other long term (current) drug therapy: Secondary | ICD-10-CM

## 2019-04-15 DIAGNOSIS — Z955 Presence of coronary angioplasty implant and graft: Secondary | ICD-10-CM

## 2019-04-15 DIAGNOSIS — Z800 Family history of malignant neoplasm of digestive organs: Secondary | ICD-10-CM

## 2019-04-15 DIAGNOSIS — E860 Dehydration: Secondary | ICD-10-CM | POA: Diagnosis not present

## 2019-04-15 DIAGNOSIS — Z8249 Family history of ischemic heart disease and other diseases of the circulatory system: Secondary | ICD-10-CM

## 2019-04-15 DIAGNOSIS — Z79890 Hormone replacement therapy (postmenopausal): Secondary | ICD-10-CM

## 2019-04-15 DIAGNOSIS — Z87891 Personal history of nicotine dependence: Secondary | ICD-10-CM

## 2019-04-15 DIAGNOSIS — E1151 Type 2 diabetes mellitus with diabetic peripheral angiopathy without gangrene: Secondary | ICD-10-CM | POA: Diagnosis present

## 2019-04-15 DIAGNOSIS — Z7902 Long term (current) use of antithrombotics/antiplatelets: Secondary | ICD-10-CM

## 2019-04-15 DIAGNOSIS — N179 Acute kidney failure, unspecified: Secondary | ICD-10-CM | POA: Diagnosis not present

## 2019-04-15 DIAGNOSIS — Z8673 Personal history of transient ischemic attack (TIA), and cerebral infarction without residual deficits: Secondary | ICD-10-CM

## 2019-04-15 DIAGNOSIS — N183 Chronic kidney disease, stage 3 unspecified: Secondary | ICD-10-CM

## 2019-04-15 DIAGNOSIS — I5042 Chronic combined systolic (congestive) and diastolic (congestive) heart failure: Secondary | ICD-10-CM

## 2019-04-15 DIAGNOSIS — A0472 Enterocolitis due to Clostridium difficile, not specified as recurrent: Secondary | ICD-10-CM | POA: Diagnosis not present

## 2019-04-15 DIAGNOSIS — I2510 Atherosclerotic heart disease of native coronary artery without angina pectoris: Secondary | ICD-10-CM | POA: Diagnosis present

## 2019-04-15 DIAGNOSIS — I447 Left bundle-branch block, unspecified: Secondary | ICD-10-CM | POA: Diagnosis present

## 2019-04-15 DIAGNOSIS — E538 Deficiency of other specified B group vitamins: Secondary | ICD-10-CM | POA: Diagnosis present

## 2019-04-15 DIAGNOSIS — E785 Hyperlipidemia, unspecified: Secondary | ICD-10-CM | POA: Diagnosis present

## 2019-04-15 DIAGNOSIS — I10 Essential (primary) hypertension: Secondary | ICD-10-CM | POA: Diagnosis present

## 2019-04-15 HISTORY — DX: Chronic kidney disease, stage 3 unspecified: N18.30

## 2019-04-15 HISTORY — DX: Chronic combined systolic (congestive) and diastolic (congestive) heart failure: I50.42

## 2019-04-15 HISTORY — DX: Acute kidney failure, unspecified: N17.9

## 2019-04-15 LAB — CBC
HCT: 45.9 % (ref 39.0–52.0)
Hemoglobin: 15 g/dL (ref 13.0–17.0)
MCH: 26.9 pg (ref 26.0–34.0)
MCHC: 32.7 g/dL (ref 30.0–36.0)
MCV: 82.4 fL (ref 80.0–100.0)
Platelets: 262 10*3/uL (ref 150–400)
RBC: 5.57 MIL/uL (ref 4.22–5.81)
RDW: 14.7 % (ref 11.5–15.5)
WBC: 20.6 10*3/uL — ABNORMAL HIGH (ref 4.0–10.5)
nRBC: 0 % (ref 0.0–0.2)

## 2019-04-15 LAB — BASIC METABOLIC PANEL
BUN: 10 mg/dL (ref 6–23)
CO2: 27 mEq/L (ref 19–32)
Calcium: 8.3 mg/dL — ABNORMAL LOW (ref 8.4–10.5)
Chloride: 99 mEq/L (ref 96–112)
Creatinine, Ser: 1 mg/dL (ref 0.40–1.50)
GFR: 73.2 mL/min (ref >60.00)
Glucose, Bld: 169 mg/dL — ABNORMAL HIGH (ref 70–99)
Potassium: 2.7 mEq/L — CL (ref 3.5–5.1)
Sodium: 138 mEq/L (ref 135–145)

## 2019-04-15 LAB — CBC WITH DIFFERENTIAL/PLATELET
Basophils Absolute: 0.1 10*3/uL (ref 0.0–0.1)
Basophils Relative: 0.5 % (ref 0.0–3.0)
Eosinophils Absolute: 0 10*3/uL (ref 0.0–0.7)
Eosinophils Relative: 0.2 % (ref 0.0–5.0)
HCT: 45.1 % (ref 39.0–52.0)
Hemoglobin: 15.1 g/dL (ref 13.0–17.0)
Lymphocytes Relative: 3.5 % — ABNORMAL LOW (ref 12.0–46.0)
Lymphs Abs: 0.7 10*3/uL (ref 0.7–4.0)
MCHC: 33.5 g/dL (ref 30.0–36.0)
MCV: 81 fl (ref 78.0–100.0)
Monocytes Absolute: 1.2 10*3/uL — ABNORMAL HIGH (ref 0.1–1.0)
Monocytes Relative: 5.8 % (ref 3.0–12.0)
Neutro Abs: 18 10*3/uL — ABNORMAL HIGH (ref 1.4–7.7)
Neutrophils Relative %: 90 % — ABNORMAL HIGH (ref 43.0–77.0)
Platelets: 245 10*3/uL (ref 150.0–400.0)
RBC: 5.56 Mil/uL (ref 4.22–5.81)
RDW: 15.6 % — ABNORMAL HIGH (ref 11.5–15.5)
WBC: 20 10*3/uL (ref 4.0–10.5)

## 2019-04-15 LAB — GLUCOSE, CAPILLARY: Glucose-Capillary: 122 mg/dL — ABNORMAL HIGH (ref 70–99)

## 2019-04-15 LAB — COMPREHENSIVE METABOLIC PANEL
ALT: 12 U/L (ref 0–44)
AST: 16 U/L (ref 15–41)
Albumin: 3.4 g/dL — ABNORMAL LOW (ref 3.5–5.0)
Alkaline Phosphatase: 72 U/L (ref 38–126)
Anion gap: 13 (ref 5–15)
BUN: 9 mg/dL (ref 8–23)
CO2: 25 mmol/L (ref 22–32)
Calcium: 8.4 mg/dL — ABNORMAL LOW (ref 8.9–10.3)
Chloride: 98 mmol/L (ref 98–111)
Creatinine, Ser: 1.25 mg/dL — ABNORMAL HIGH (ref 0.61–1.24)
GFR calc Af Amer: >60 mL/min (ref >60)
GFR calc non Af Amer: 57 mL/min — ABNORMAL LOW (ref >60)
Glucose, Bld: 164 mg/dL — ABNORMAL HIGH (ref 70–99)
Potassium: 2.5 mmol/L — CL (ref 3.5–5.1)
Sodium: 136 mmol/L (ref 135–145)
Total Bilirubin: 1.4 mg/dL — ABNORMAL HIGH (ref 0.3–1.2)
Total Protein: 6.2 g/dL — ABNORMAL LOW (ref 6.5–8.1)

## 2019-04-15 LAB — MAGNESIUM: Magnesium: 1.9 mg/dL (ref 1.7–2.4)

## 2019-04-15 LAB — SARS CORONAVIRUS 2 BY RT PCR (HOSPITAL ORDER, PERFORMED IN ~~LOC~~ HOSPITAL LAB): SARS Coronavirus 2: NEGATIVE

## 2019-04-15 LAB — LIPASE, BLOOD: Lipase: 20 U/L (ref 11–51)

## 2019-04-15 MED ORDER — CARVEDILOL 25 MG PO TABS
25.0000 mg | ORAL_TABLET | Freq: Two times a day (BID) | ORAL | Status: DC
  Administered 2019-04-16 – 2019-04-18 (×6): 25 mg via ORAL
  Filled 2019-04-15 (×6): qty 1

## 2019-04-15 MED ORDER — SODIUM CHLORIDE 0.9 % IV BOLUS (SEPSIS)
500.0000 mL | Freq: Once | INTRAVENOUS | Status: AC
  Administered 2019-04-15: 500 mL via INTRAVENOUS

## 2019-04-15 MED ORDER — OMEGA-3-ACID ETHYL ESTERS 1 G PO CAPS
1.0000 g | ORAL_CAPSULE | Freq: Every day | ORAL | Status: DC
  Administered 2019-04-16 – 2019-04-17 (×3): 1 g via ORAL
  Filled 2019-04-15 (×3): qty 1

## 2019-04-15 MED ORDER — DABIGATRAN ETEXILATE MESYLATE 150 MG PO CAPS
150.0000 mg | ORAL_CAPSULE | Freq: Two times a day (BID) | ORAL | Status: DC
  Administered 2019-04-16: 150 mg via ORAL
  Filled 2019-04-15 (×2): qty 1

## 2019-04-15 MED ORDER — LEVOTHYROXINE SODIUM 50 MCG PO TABS
50.0000 ug | ORAL_TABLET | Freq: Every day | ORAL | Status: DC
  Administered 2019-04-16 – 2019-04-18 (×3): 50 ug via ORAL
  Filled 2019-04-15 (×3): qty 1

## 2019-04-15 MED ORDER — POTASSIUM CHLORIDE 10 MEQ/100ML IV SOLN
10.0000 meq | INTRAVENOUS | Status: AC
  Administered 2019-04-15 (×2): 10 meq via INTRAVENOUS
  Filled 2019-04-15 (×2): qty 100

## 2019-04-15 MED ORDER — SODIUM CHLORIDE 0.9 % IV SOLN
INTRAVENOUS | Status: DC
  Administered 2019-04-16: via INTRAVENOUS

## 2019-04-15 MED ORDER — MESALAMINE 1.2 G PO TBEC
2.4000 g | DELAYED_RELEASE_TABLET | Freq: Every day | ORAL | Status: DC
  Administered 2019-04-16 – 2019-04-18 (×4): 2.4 g via ORAL
  Filled 2019-04-15 (×3): qty 2

## 2019-04-15 MED ORDER — ATORVASTATIN CALCIUM 10 MG PO TABS
20.0000 mg | ORAL_TABLET | Freq: Every day | ORAL | Status: DC
  Administered 2019-04-16 – 2019-04-18 (×3): 20 mg via ORAL
  Filled 2019-04-15 (×3): qty 2

## 2019-04-15 MED ORDER — SODIUM CHLORIDE 0.9 % IV SOLN
1000.0000 mL | INTRAVENOUS | Status: DC
  Administered 2019-04-15: 22:00:00 1000 mL via INTRAVENOUS

## 2019-04-15 MED ORDER — INSULIN ASPART 100 UNIT/ML ~~LOC~~ SOLN
0.0000 [IU] | Freq: Three times a day (TID) | SUBCUTANEOUS | Status: DC
  Administered 2019-04-16 (×2): 1 [IU] via SUBCUTANEOUS
  Administered 2019-04-16 – 2019-04-17 (×2): 2 [IU] via SUBCUTANEOUS
  Administered 2019-04-17 (×2): 1 [IU] via SUBCUTANEOUS

## 2019-04-15 MED ORDER — ISOSORBIDE MONONITRATE ER 60 MG PO TB24
60.0000 mg | ORAL_TABLET | Freq: Every day | ORAL | Status: DC
  Administered 2019-04-16 – 2019-04-18 (×3): 60 mg via ORAL
  Filled 2019-04-15 (×3): qty 1

## 2019-04-15 MED ORDER — ACETAMINOPHEN 650 MG RE SUPP: 650.0000 mg | Freq: Four times a day (QID) | RECTAL | Status: DC | PRN

## 2019-04-15 MED ORDER — POTASSIUM CHLORIDE CRYS ER 20 MEQ PO TBCR
40.0000 meq | EXTENDED_RELEASE_TABLET | Freq: Once | ORAL | Status: AC
  Administered 2019-04-15: 20:00:00 40 meq via ORAL
  Filled 2019-04-15: qty 2

## 2019-04-15 MED ORDER — CLOPIDOGREL BISULFATE 75 MG PO TABS
75.0000 mg | ORAL_TABLET | Freq: Every day | ORAL | Status: DC
  Administered 2019-04-16: 75 mg via ORAL
  Filled 2019-04-15: qty 1

## 2019-04-15 MED ORDER — ACETAMINOPHEN 325 MG PO TABS
650.0000 mg | ORAL_TABLET | Freq: Four times a day (QID) | ORAL | Status: DC | PRN
  Administered 2019-04-16 (×2): 650 mg via ORAL
  Filled 2019-04-15 (×2): qty 2

## 2019-04-15 NOTE — ED Notes (Signed)
Call 50mfor report, tried calling 2 times

## 2019-04-15 NOTE — Telephone Encounter (Signed)
Spoke with Sharyn Lull. Pt c/o loose stool since Wednesday. Recently on abx, Clindamycin, for tooth extraction. Having 6 BMs daily. Has UC. Noticed blood in the stool, on blood thinner. Difficulty urinating and pelvic pressure x 1 day. On fluid pill. No fever.   Spoke with Aldona Bar, prefers that pt be worked in with GI.  Spoke with RN Lead Brittney to see if she can reach out to GI NUR and try to get pt worked in.   Also forwarding to Dr. Yong Channel and Inda Coke as Juluis Rainier (there is a hold placed on Samantha Worley's schedule for today at 3:40 if pt can't be worked in with GI).

## 2019-04-15 NOTE — Telephone Encounter (Signed)
Dr. Tarri Glenn, a critical was called on this patient:WBC 20,000

## 2019-04-15 NOTE — H&P (Addendum)
History and Physical    [name]:[id number] DOB: 23-Nov-1945 DOA: 04/15/2019  PCP: Marin Olp, MD  Patient coming from: Home.  Chief Complaint: Diarrhea and weakness.  HPI: [name] is a 73 y.o. male with history of ulcerative colitis, atrial fibrillation, CAD status post stenting, chronic combined systolic and diastolic dysfunction, sleep apnea, hypothyroidism, diabetes mellitus presents to the ER because of increasing weakness over the last 4 days.  Patient's weakness started along with patient having multiple episodes of diarrhea.  Some of the diarrhea was blood-streaked.  Denies any abdominal pain nausea or vomiting but has been having poor appetite.  Denies chest pain or shortness of breath.  Patient states he had taken antibiotics a month ago for tooth infection which was for 2 weeks.  ED Course: In the ER patient appears generally weak but nonfocal.  Labs reveal potassium 2.5 creatinine increased to 1.2 lipase 20 LFTs were largely unremarkable CBC was showing leukocytosis of 20,000 hemoglobin 15 platelets 245.  While in the ER patient had another bowel movement which was sent for stool studies.  Patient was started on gentle hydration and admitted for weakness likely from severe diarrhea and further work-up for diarrhea.  EKG shows A. fib with slightly elevated heart rate.  Review of Systems: As per HPI, rest all negative.   Past Medical History:  Diagnosis Date  . Atrial fibrillation ([place])    initial diagnoses 2012  . B12 deficiency    per patient previously taking shots  . CORONARY ARTERY DISEASE 04/12/2007   2 stents last in 2006.   Marland Kitchen DIABETES MELLITUS, TYPE II 04/16/2007  . Diverticulosis of colon (without mention of hemorrhage)   . HYPERLIPIDEMIA 04/16/2007   reveal study. not sure of medication  . HYPERTENSION 04/12/2007  . HYPOTHYROIDISM 04/12/2007  . Ischemic cardiomyopathy    cath 35-40%  . MYOCARDIAL INFARCTION, HX OF 04/12/2007  . OBESITY 06/16/2009  .  SLEEP APNEA 04/12/2007   CPAP  . Stroke ([place])   . ULCERATIVE COLITIS, LEFT SIDED 11/23/2010    Past Surgical History:  Procedure Laterality Date  . CARDIAC CATHETERIZATION  10/2002   STENT. 2 stents Dr. Maurene Capes  . CARDIAC CATHETERIZATION N/A 10/28/2015   Procedure: Right/Left Heart Cath and Coronary Angiography;  Surgeon: Sherren Mocha, MD;  Location: [facility];  Service: Cardiovascular;  Laterality: N/A;  . CORONARY STENT PLACEMENT  2008   LAD   . RIGHT/LEFT HEART CATH AND CORONARY ANGIOGRAPHY N/A 06/01/2018   Procedure: RIGHT/LEFT HEART CATH AND CORONARY ANGIOGRAPHY;  Surgeon: Sherren Mocha, MD;  Location: [facility];  Service: Cardiovascular;  Laterality: N/A;  . TONSILLECTOMY       reports that he quit smoking about 53 years ago. His smoking use included cigarettes. He has a 5.00 pack-year smoking history. He has never used smokeless tobacco. He reports that he does not drink alcohol or use drugs.  No Known Allergies  Family History  Problem Relation Age of Onset  . Heart attack Mother        mid 56s  . Heart disease Father        H/O CAD, CABG, VALVE SURGERY  . Hypertension Brother   . Obesity Brother   . Stomach cancer Maternal Aunt   . Stomach cancer Paternal Grandmother        ? colon     Prior to Admission medications   Medication Sig Start Date End Date Taking? Authorizing Provider  acetaminophen (TYLENOL) 500  MG tablet Take 1,000 mg by mouth every 6 (six) hours as needed for headache (pain).   Yes [provider]  atorvastatin (LIPITOR) 20 MG tablet TAKE ONE TABLET (20MG TOTAL) BY MOUTH DAILY Patient taking differently: Take 20 mg by mouth daily with breakfast.  03/28/19  Yes Sherren Mocha, MD  carvedilol (COREG) 25 MG tablet TAKE ONE TABLET BY MOUTH TWICE A DAY Patient taking differently: Take 25 mg by mouth 2 (two) times daily with a meal.  03/01/19  Yes Sherren Mocha, MD  clopidogrel (PLAVIX) 75 MG tablet Take 1 tablet (75 mg total) by  mouth daily. Patient taking differently: Take 75 mg by mouth daily with breakfast.  02/28/19  Yes Marin Olp, MD  Coenzyme Q10 (COQ10 PO) Take 1 capsule by mouth daily with supper.   Yes [provider]  empagliflozin (JARDIANCE) 10 MG TABS tablet TAKE ONE (1) TABLET BY MOUTH EVERY DAY Patient taking differently: Take 10 mg by mouth daily with breakfast.  02/27/19  Yes Philemon Kingdom, MD  fish oil-omega-3 fatty acids 1000 MG capsule Take 1 g by mouth daily with supper.    Yes [provider]  furosemide (LASIX) 40 MG tablet Take 1 tablet (40 mg total) by mouth daily. Take extra tablet once daily in the PM as needed for weight gain 3 lbs in 24 hrs or swelling. Patient taking differently: Take 40 mg by mouth See admin instructions. Take one tablet (40 mg) by mouth daily with breakfast, may take an extra tablet once daily in the evening as needed for weight gain 3 lbs in 24 hrs or swelling. 05/10/18 05/05/19 Yes Bensimhon, Shaune Pascal, MD  glipiZIDE (GLUCOTROL) 10 MG tablet TAKE ONE TABLET BY MOUTH TWICE A DAY Patient taking differently: Take 10 mg by mouth 2 (two) times daily before a meal.  03/01/19  Yes Philemon Kingdom, MD  glucosamine-chondroitin 500-400 MG tablet Take 1 tablet by mouth daily as needed (knee flare up or pain).   Yes [provider]  isosorbide mononitrate (IMDUR) 60 MG 24 hr tablet TAKE ONE (1) TABLET BY MOUTH EVERY DAY Patient taking differently: Take 60 mg by mouth daily with breakfast.  06/13/18  Yes Sherren Mocha, MD  mesalamine (APRISO) 0.375 g 24 hr capsule Take 4 capsules (1.5 g total) by mouth daily. Patient taking differently: Take 1.5 g by mouth daily with breakfast.  03/14/18  Yes Ladene Artist, MD  metFORMIN (GLUCOPHAGE) 1000 MG tablet TAKE ONE TABLET BY MOUTH TWICE A DAY WITH A MEAL Patient taking differently: Take 1,000 mg by mouth 2 (two) times daily with a meal.  01/16/19  Yes Philemon Kingdom, MD  PRADAXA 150 MG CAPS capsule TAKE ONE  (1) CAPSULE BY MOUTH TWICE A DAY. (EVERY 12 HOURS.) (EVERY 12 HOURS) Patient taking differently: Take 150 mg by mouth 2 (two) times daily with a meal.  01/16/19  Yes Allred, Jeneen Rinks, MD  sacubitril-valsartan (ENTRESTO) 97-103 MG Take 1 tablet by mouth 2 (two) times daily. Patient taking differently: Take 1 tablet by mouth 2 (two) times daily with a meal.  02/12/19  Yes Bensimhon, Shaune Pascal, MD  sitaGLIPtin (JANUVIA) 100 MG tablet TAKE ONE (1) TABLET BY MOUTH EVERY DAY Patient taking differently: Take 100 mg by mouth daily with breakfast.  01/16/19  Yes Philemon Kingdom, MD  spironolactone (ALDACTONE) 25 MG tablet TAKE ONE TABLET (25MG TOTAL) BY MOUTH DAILY Patient taking differently: Take 25 mg by mouth daily with breakfast.  04/11/19  Yes Sherren Mocha, MD  SYNTHROID 50 MCG tablet TAKE 1 TABLET (50 MG TOTAL) BY MOUTH DAILY BEFORE BREAKFAST Patient taking differently: Take 50 mcg by mouth daily before breakfast.  12/13/18  Yes Philemon Kingdom, MD  glucose blood test strip Use as instructed to test 4 time daily 06/21/18   Philemon Kingdom, MD    Physical Exam: Constitutional: Moderately built and nourished. Vitals:   04/15/19 1900 04/15/19 2000 04/15/19 2015 04/15/19 2045  BP: 113/78 137/78 132/87 127/74  Pulse: 99  100 94  Resp: 18 20 20  (!) 23  Temp:      TempSrc:      SpO2: 97%  98% 98%  Weight:      Height:       Eyes: Anicteric no pallor. ENMT: No discharge from the ears eyes nose or mouth. Neck: No mass felt.  No neck rigidity. Respiratory: No rhonchi or crepitations. Cardiovascular: S1-S2 heard. Abdomen: Soft nontender bowel sounds present. Musculoskeletal: No edema. Skin: No rash. Neurologic: Alert awake oriented to time place and person.  Moves all extremities. Psychiatric: Appears normal per normal affect.   Labs on Admission: I have personally reviewed following labs and imaging studies  CBC: Recent Labs  Lab 04/15/19 1402 04/15/19 1717  WBC 20.0* 20.6*  NEUTROABS  18.0*  --   HGB 15.1 15.0  HCT 45.1 45.9  MCV 81.0 82.4  PLT 245.0 892   Basic Metabolic Panel: Recent Labs  Lab 04/15/19 1402 04/15/19 1717 04/15/19 1736  NA 138 136  --   K 2.7* 2.5*  --   CL 99 98  --   CO2 27 25  --   GLUCOSE 169* 164*  --   BUN 10 9  --   CREATININE 1.00 1.25*  --   CALCIUM 8.3* 8.4*  --   MG  --   --  1.9   GFR: Estimated Creatinine Clearance: 65 mL/min (A) (by C-G formula based on SCr of 1.25 mg/dL (H)). Liver Function Tests: Recent Labs  Lab 04/15/19 1717  AST 16  ALT 12  ALKPHOS 72  BILITOT 1.4*  PROT 6.2*  ALBUMIN 3.4*   Recent Labs  Lab 04/15/19 1717  LIPASE 20   No results for input(s): AMMONIA in the last 168 hours. Coagulation Profile: No results for input(s): INR, PROTIME in the last 168 hours. Cardiac Enzymes: No results for input(s): CKTOTAL, CKMB, CKMBINDEX, TROPONINI in the last 168 hours. BNP (last 3 results) Recent Labs    04/25/18 0756 05/09/18 1217 05/28/18 1123  PROBNP 1,112* 764* 646*   HbA1C: No results for input(s): HGBA1C in the last 72 hours. CBG: No results for input(s): GLUCAP in the last 168 hours. Lipid Profile: No results for input(s): CHOL, HDL, LDLCALC, TRIG, CHOLHDL, LDLDIRECT in the last 72 hours. Thyroid Function Tests: No results for input(s): TSH, T4TOTAL, FREET4, T3FREE, THYROIDAB in the last 72 hours. Anemia Panel: No results for input(s): VITAMINB12, FOLATE, FERRITIN, TIBC, IRON, RETICCTPCT in the last 72 hours. Urine analysis: No results found for: COLORURINE, APPEARANCEUR, LABSPEC, PHURINE, GLUCOSEU, HGBUR, BILIRUBINUR, KETONESUR, PROTEINUR, UROBILINOGEN, NITRITE, LEUKOCYTESUR Sepsis Labs: @LABRCNTIP (procalcitonin:4,lacticidven:4) ) Recent Results (from the past 240 hour(s))  SARS Coronavirus 2 (CEPHEID - Performed in [place] hospital lab), Hosp Order     Status: None   Collection Time: 04/15/19  7:46 PM   Specimen: Nasopharyngeal Swab  Result Value Ref Range Status   SARS  Coronavirus 2 NEGATIVE NEGATIVE Final    Comment: (NOTE) If result is NEGATIVE SARS-CoV-2 target nucleic acids are NOT  DETECTED. The SARS-CoV-2 RNA is generally detectable in upper and lower  respiratory specimens during the acute phase of infection. The lowest  concentration of SARS-CoV-2 viral copies this assay can detect is 250  copies / mL. A negative result does not preclude SARS-CoV-2 infection  and should not be used as the sole basis for treatment or other  patient management decisions.  A negative result may occur with  improper specimen collection / handling, submission of specimen other  than nasopharyngeal swab, presence of viral mutation(s) within the  areas targeted by this assay, and inadequate number of viral copies  (<250 copies / mL). A negative result must be combined with clinical  observations, patient history, and epidemiological information. If result is POSITIVE SARS-CoV-2 target nucleic acids are DETECTED. The SARS-CoV-2 RNA is generally detectable in upper and lower  respiratory specimens dur ing the acute phase of infection.  Positive  results are indicative of active infection with SARS-CoV-2.  Clinical  correlation with patient history and other diagnostic information is  necessary to determine patient infection status.  Positive results do  not rule out bacterial infection or co-infection with other viruses. If result is PRESUMPTIVE POSTIVE SARS-CoV-2 nucleic acids MAY BE PRESENT.   A presumptive positive result was obtained on the submitted specimen  and confirmed on repeat testing.  While 2019 novel coronavirus  (SARS-CoV-2) nucleic acids may be present in the submitted sample  additional confirmatory testing may be necessary for epidemiological  and / or clinical management purposes  to differentiate between  SARS-CoV-2 and other Sarbecovirus currently known to infect humans.  If clinically indicated additional testing with an alternate test   methodology [phone]) is advised. The SARS-CoV-2 RNA is generally  detectable in upper and lower respiratory sp ecimens during the acute  phase of infection. The expected result is Negative. Fact Sheet for Patients:  StrictlyIdeas.no Fact Sheet for Healthcare Providers: BankingDealers.co.za This test is not yet approved or cleared by the [place] FDA and has been authorized for detection and/or diagnosis of SARS-CoV-2 by FDA under an Emergency Use Authorization (EUA).  This EUA will remain in effect (meaning this test can be used) for the duration of the COVID-19 declaration under Section 564(b)(1) of the Act, 21 U.S.C. section 360bbb-3(b)(1), unless the authorization is terminated or revoked sooner. Performed at [facility], [place] [address] [place], [place] [address]      Radiological Exams on Admission: No results found.  EKG: Independently reviewed.  A. fib rate around 100 bpm with LBBB.  Assessment/Plan Active Problems:   Hypothyroidism   Type II diabetes mellitus with peripheral circulatory disorder (HCC)   Hyperlipidemia   Essential hypertension   CAD (coronary artery disease), native coronary artery   OSA (obstructive sleep apnea)   Atrial fibrillation (HCC)   Ulcerative pancolitis without complication (HCC)   Morbid obesity (HCC)   Dehydration   Diarrhea   ARF (acute renal failure) (HCC)   Chronic combined systolic and diastolic CHF (congestive heart failure) (HCC)   Hypokalemia    1. Severe diarrhea -differentials include antibiotic associated diarrhea with recent use of antibiotics or could be ulcerative colitis exacerbation.  Will check sed rate CRP levels check stool studies.  Consult gastroenterologist Dr. Fuller Plan from [facility] in the morning.  Patient is on mesalamine. 2. Generalized weakness from dehydration from diarrhea for which we will gently hydrate hold patient's Lasix spironolactone Entresto  for now. 3. Severe hypokalemia likely from severe diarrhea replace and recheck.  Check  magnesium. 4. A. fib on Coreg.  Patient also takes Pradaxa.  Since patient is still having some blood-streaked stools and stools also mucousy.  We will keep patient on heparin in anticipation of procedure.  Patient agreeable. 5. History of CAD denies any chest pain.  On Plavix statins Coreg. 6. Sleep apnea on CPAP. 7. Chronic combined systolic and diastolic heart failure last EF measured was 25 to 30% with grade 3 diastolic dysfunction presently holding Lasix spironolactone and Entresto due to dehydration and weakness. 8. Acute renal failure likely from dehydration.  Gently hydrating.  Closely monitor respiratory status and patient has CHF. 9. Leukocytosis could be from diarrhea.  Patient is afebrile.  Closely monitor.   DVT prophylaxis: Heparin infusion. Code Status: Full code. Family Communication: Will need to discuss with patient's family. Disposition Plan: Home. Consults called: None. Admission status: Observation.   Rise Patience MD [facility] Pager [phone].  If 7PM-7AM, please contact night-coverage [email or web address] Password [email or web address]  04/15/2019, 10:15 PM

## 2019-04-15 NOTE — Progress Notes (Signed)
Attempted to call for report. Call rang to front desk 3 times. Awaiting report at this time.

## 2019-04-15 NOTE — Telephone Encounter (Signed)
  Spoke with Clinical nurse manager with [facility] in regards to below issue, and to see if the patient could be seen in the GI office sooner. Awaiting Clinical Nurse manager response.

## 2019-04-15 NOTE — Telephone Encounter (Signed)
Dr. Tarri Glenn, the lab called with a critical on this patient: WBC 20,000

## 2019-04-15 NOTE — Telephone Encounter (Signed)
I spoke with the patient's granddaughter. Patient has had loose stools since last Wed.  He had one isolated episode of rectal bleeding last Wed.  He had 6 loose stools yesterday and has already had 3 this am.  He is maintained on his Apriso for UC.  He recently completed a round of Clindamycin for a dental procedure.  Completed approximately a week or so ago.  Dr. Tarri Glenn you are MD of the day please advise

## 2019-04-15 NOTE — Telephone Encounter (Signed)
Called [facility] and advised. She will pass the information along regarding what to do when he arrives and covid testing. Message sent to [facility] to set up Covid testing.

## 2019-04-15 NOTE — ED Notes (Signed)
Called lab to add on Mag

## 2019-04-15 NOTE — Telephone Encounter (Signed)
Called and spoke with [facility]. They were contacted by on call GI doc and told to come in for labs and stool card. Dr. Yong Channel had cancellation today at 4:20. Scheduled pt for eval of sx d/t concern from granddaughter.

## 2019-04-15 NOTE — Telephone Encounter (Signed)
Diarrhea/loose stool can be from COVID-19-please bring him in the side door and put in distant sports medicine room.   Please wear your face shield (also n95 if you feel more comfortable). Go ahead and order for covid 19 test (ask me if you need to know how to order with [facility]). I will likely interview from my office and go in for brief exam- using cell phone so have him bring his phone.

## 2019-04-15 NOTE — Telephone Encounter (Signed)
Noted. Thanks.

## 2019-04-15 NOTE — Telephone Encounter (Signed)
Spoke with patient's granddaughter.  She states patient does not need to be scheduled for COVID 19 testing as he is being sent to ER at [facility] for evaluation due to dehydration.

## 2019-04-15 NOTE — ED Triage Notes (Signed)
Pt arrives POV referred by PCP for eval of low potassium and "dehydration d/t his kidney function." Pt reports his K was 2.8. Pt states abd bug the last two weeks w/ extensive diarrhea. Pts daughter is a Marine scientist and advised him to present here for eval. Pt denies CP/SOB/Palpitations

## 2019-04-15 NOTE — ED Notes (Signed)
ED TO INPATIENT HANDOFF REPORT  ED Nurse Name and Phone #: Benjamine Mola [phone]  S Name/Age/Gender [name] 73 y.o. male Room/Bed: 031C/031C  Code Status   Code Status: Full Code  Home/SNF/Other Home Patient oriented to: self, place, time and situation Is this baseline? Yes   Triage Complete: Triage complete  Chief Complaint Low Potassium  Triage Note Pt arrives POV referred by PCP for eval of low potassium and "dehydration d/t his kidney function." Pt reports his K was 2.8. Pt states abd bug the last two weeks w/ extensive diarrhea. Pts daughter is a Marine scientist and advised him to present here for eval. Pt denies CP/SOB/Palpitations    Allergies No Known Allergies  Level of Care/Admitting Diagnosis ED Disposition    ED Disposition Condition [place]: [place] [100100]  Level of Care: Telemetry Medical [104]  I expect the patient will be discharged within 24 hours: No (not a candidate for 5C-Observation unit)  Covid Evaluation: Asymptomatic Screening Protocol (No Symptoms)  Diagnosis: Dehydration [276.51.ICD-9-CM]  Admitting Physician: Rise Patience [phone]  Attending Physician: Rise Patience [phone]  PT Class (Do Not Modify): Observation [104]  PT Acc Code (Do Not Modify): Observation [10022]       B Medical/Surgery History Past Medical History:  Diagnosis Date  . Atrial fibrillation ([place])    initial diagnoses 2012  . B12 deficiency    per patient previously taking shots  . CORONARY ARTERY DISEASE 04/12/2007   2 stents last in 2006.   Marland Kitchen DIABETES MELLITUS, TYPE II 04/16/2007  . Diverticulosis of colon (without mention of hemorrhage)   . HYPERLIPIDEMIA 04/16/2007   reveal study. not sure of medication  . HYPERTENSION 04/12/2007  . HYPOTHYROIDISM 04/12/2007  . Ischemic cardiomyopathy    cath 35-40%  . MYOCARDIAL INFARCTION, HX OF 04/12/2007  . OBESITY 06/16/2009  . SLEEP APNEA 04/12/2007   CPAP  . Stroke ([place])   .  ULCERATIVE COLITIS, LEFT SIDED 11/23/2010   Past Surgical History:  Procedure Laterality Date  . CARDIAC CATHETERIZATION  10/2002   STENT. 2 stents Dr. Maurene Capes  . CARDIAC CATHETERIZATION N/A 10/28/2015   Procedure: Right/Left Heart Cath and Coronary Angiography;  Surgeon: Sherren Mocha, MD;  Location: [facility];  Service: Cardiovascular;  Laterality: N/A;  . CORONARY STENT PLACEMENT  2008   LAD   . RIGHT/LEFT HEART CATH AND CORONARY ANGIOGRAPHY N/A 06/01/2018   Procedure: RIGHT/LEFT HEART CATH AND CORONARY ANGIOGRAPHY;  Surgeon: Sherren Mocha, MD;  Location: [facility];  Service: Cardiovascular;  Laterality: N/A;  . TONSILLECTOMY       A IV Location/Drains/Wounds Patient Lines/Drains/Airways Status   Active Line/Drains/Airways    Name:   Placement date:   Placement time:   Site:   Days:   Peripheral IV 04/15/19 Right Antecubital   04/15/19    1904    Antecubital   less than 1          Intake/Output Last 24 hours  Intake/Output Summary (Last 24 hours) at 04/15/2019 2232 Last data filed at 04/15/2019 2232 Gross per 24 hour  Intake 699.59 ml  Output -  Net 699.59 ml    Labs/Imaging Results for orders placed or performed during the hospital encounter of 04/15/19 (from the past 48 hour(s))  Lipase, blood     Status: None   Collection Time: 04/15/19  5:17 PM  Result Value Ref Range   Lipase 20 11 - 51 U/L  Comment: Performed at [facility], [place] [address] [place], [place] [address]  Comprehensive metabolic panel     Status: Abnormal   Collection Time: 04/15/19  5:17 PM  Result Value Ref Range   Sodium 136 135 - 145 mmol/L   Potassium 2.5 (LL) 3.5 - 5.1 mmol/L    Comment: CRITICAL RESULT CALLED TO, READ BACK BY AND VERIFIED WITH: J.EASLY RN 1809 04/15/2019 MCCORMICK K    Chloride 98 98 - 111 mmol/L   CO2 25 22 - 32 mmol/L   Glucose, Bld 164 (H) 70 - 99 mg/dL   BUN 9 8 - 23 mg/dL   Creatinine, Ser 1.25 (H) 0.61 - 1.24 mg/dL   Calcium 8.4 (L) 8.9 -  10.3 mg/dL   Total Protein 6.2 (L) 6.5 - 8.1 g/dL   Albumin 3.4 (L) 3.5 - 5.0 g/dL   AST 16 15 - 41 U/L   ALT 12 0 - 44 U/L   Alkaline Phosphatase 72 38 - 126 U/L   Total Bilirubin 1.4 (H) 0.3 - 1.2 mg/dL   GFR calc non Af Amer 57 (L) >60 mL/min   GFR calc Af Amer >60 >60 mL/min   Anion gap 13 5 - 15    Comment: Performed at [facility], [address], [place], [place] [address]  CBC     Status: Abnormal   Collection Time: 04/15/19  5:17 PM  Result Value Ref Range   WBC 20.6 (H) 4.0 - 10.5 K/uL   RBC 5.57 4.22 - 5.81 MIL/uL   Hemoglobin 15.0 13.0 - 17.0 g/dL   HCT 45.9 39.0 - 52.0 %   MCV 82.4 80.0 - 100.0 fL   MCH 26.9 26.0 - 34.0 pg   MCHC 32.7 30.0 - 36.0 g/dL   RDW 14.7 11.5 - 15.5 %   Platelets 262 150 - 400 K/uL   nRBC 0.0 0.0 - 0.2 %    Comment: Performed at [facility], [place] [address], [place], [place] [address]  Magnesium     Status: None   Collection Time: 04/15/19  5:36 PM  Result Value Ref Range   Magnesium 1.9 1.7 - 2.4 mg/dL    Comment: Performed at [facility] [address], [place], [place] [address]  SARS Coronavirus 2 (CEPHEID - Performed in [facility] hospital lab), Hosp Order     Status: None   Collection Time: 04/15/19  7:46 PM   Specimen: Nasopharyngeal Swab  Result Value Ref Range   SARS Coronavirus 2 NEGATIVE NEGATIVE    Comment: (NOTE) If result is NEGATIVE SARS-CoV-2 target nucleic acids are NOT DETECTED. The SARS-CoV-2 RNA is generally detectable in upper and lower  respiratory specimens during the acute phase of infection. The lowest  concentration of SARS-CoV-2 viral copies this assay can detect is 250  copies / mL. A negative result does not preclude SARS-CoV-2 infection  and should not be used as the sole basis for treatment or other  patient management decisions.  A negative result may occur with  improper specimen collection / handling, submission of specimen other  than nasopharyngeal swab, presence of viral  mutation(s) within the  areas targeted by this assay, and inadequate number of viral copies  (<250 copies / mL). A negative result must be combined with clinical  observations, patient history, and epidemiological information. If result is POSITIVE SARS-CoV-2 target nucleic acids are DETECTED. The SARS-CoV-2 RNA is generally detectable in upper and lower  respiratory specimens  dur ing the acute phase of infection.  Positive  results are indicative of active infection with SARS-CoV-2.  Clinical  correlation with patient history and other diagnostic information is  necessary to determine patient infection status.  Positive results do  not rule out bacterial infection or co-infection with other viruses. If result is PRESUMPTIVE POSTIVE SARS-CoV-2 nucleic acids MAY BE PRESENT.   A presumptive positive result was obtained on the submitted specimen  and confirmed on repeat testing.  While 2019 novel coronavirus  (SARS-CoV-2) nucleic acids may be present in the submitted sample  additional confirmatory testing may be necessary for epidemiological  and / or clinical management purposes  to differentiate between  SARS-CoV-2 and other Sarbecovirus currently known to infect humans.  If clinically indicated additional testing with an alternate test  methodology [phone]) is advised. The SARS-CoV-2 RNA is generally  detectable in upper and lower respiratory sp ecimens during the acute  phase of infection. The expected result is Negative. Fact Sheet for Patients:  StrictlyIdeas.no Fact Sheet for Healthcare Providers: BankingDealers.co.za This test is not yet approved or cleared by the [place] FDA and has been authorized for detection and/or diagnosis of SARS-CoV-2 by FDA under an Emergency Use Authorization (EUA).  This EUA will remain in effect (meaning this test can be used) for the duration of the COVID-19 declaration under Section 564(b)(1)  of the Act, 21 U.S.C. section 360bbb-3(b)(1), unless the authorization is terminated or revoked sooner. Performed at [facility], [place] [address], [place],  [address]    No results found.  Pending Labs Unresulted Labs (From admission, onward)    Start     Ordered   04/16/19 0500  Magnesium  Tomorrow morning,   R     04/15/19 2220   04/16/19 0500  Comprehensive metabolic panel  Tomorrow morning,   R     04/15/19 2220   04/16/19 0500  CBC WITH DIFFERENTIAL  Tomorrow morning,   R     04/15/19 2220   04/15/19 1910  Gastrointestinal Panel by PCR , Stool  (Gastrointestinal Panel by PCR, Stool)  Once,   STAT     04/15/19 1911   04/15/19 1717  Urinalysis, Routine w reflex microscopic  ONCE - STAT,   STAT     04/15/19 1716          Vitals/Pain Today's Vitals   04/15/19 1900 04/15/19 2000 04/15/19 2015 04/15/19 2045  BP: 113/78 137/78 132/87 127/74  Pulse: 99  100 94  Resp: 18 20 20  (!) 23  Temp:      TempSrc:      SpO2: 97%  98% 98%  Weight:      Height:      PainSc:        Isolation Precautions Enteric precautions (UV disinfection)  Medications Medications  atorvastatin (LIPITOR) tablet 20 mg (has no administration in time range)  carvedilol (COREG) tablet 25 mg (has no administration in time range)  isosorbide mononitrate (IMDUR) 24 hr tablet 60 mg (has no administration in time range)  levothyroxine (SYNTHROID) tablet 50 mcg (has no administration in time range)  mesalamine (APRISO) 24 hr capsule 1.5 g (has no administration in time range)  clopidogrel (PLAVIX) tablet 75 mg (has no administration in time range)  dabigatran (PRADAXA) capsule 150 mg (has no administration in time range)  fish oil-omega-3 fatty acids capsule 1 g (has no administration in time range)  acetaminophen (TYLENOL) tablet 650 mg (has no administration in time range)  Or  acetaminophen (TYLENOL) suppository 650 mg (has no administration in time range)  insulin aspart (novoLOG)  injection 0-9 Units (has no administration in time range)  0.9 %  sodium chloride infusion (has no administration in time range)  potassium chloride 10 mEq in 100 mL IVPB (0 mEq Intravenous Stopped 04/15/19 2232)  sodium chloride 0.9 % bolus 500 mL (0 mLs Intravenous Stopped 04/15/19 2057)  potassium chloride SA (K-DUR) CR tablet 40 mEq (40 mEq Oral Given 04/15/19 1930)    Mobility walks Low fall risk   Focused Assessments    R Recommendations: See Admitting Provider Note  Report given to:   Additional Notes:

## 2019-04-15 NOTE — Telephone Encounter (Signed)
Pt's daughter called stating that her father GI sxs have worsened during the weekend. Pt is scheduled for an appt with Jessica on 7/17 but is requesting a sooner appt. There is nothing available with PA or Dr. Fuller Plan before 7/17.

## 2019-04-15 NOTE — ED Notes (Signed)
Gave grand daughter updates on the phone

## 2019-04-15 NOTE — Telephone Encounter (Signed)
Patient wanted to schedule for colitis flare up and loose stool. I spoke with Theadora Rama and she was transferred the call to speak to the patient.

## 2019-04-15 NOTE — Telephone Encounter (Signed)
Patient notified of the recommendations New labs entered She will have him come for labs this am

## 2019-04-15 NOTE — ED Notes (Signed)
Pt has been updating his wife and grand daughter.

## 2019-04-15 NOTE — Telephone Encounter (Signed)
Spoke with patients granddaughter Sharyn Lull and informed her that patients potassium is low and he needs to go to the ER ASAP. She verbalized understanding and states he is on his way to Dr. Ansel Bong office for an appointment.

## 2019-04-15 NOTE — Telephone Encounter (Signed)
[name] from the lab called with a critical WBC of 20,000. She is going to run a smear.

## 2019-04-15 NOTE — Telephone Encounter (Signed)
We spoke with patient and granddaughter as he was on his way to our appointment-directed him to the emergency room as directed by GI

## 2019-04-15 NOTE — Telephone Encounter (Signed)
Noted! Thank you

## 2019-04-15 NOTE — Telephone Encounter (Signed)
I am sorry that he is having difficulty. I reviewed his virtual encounter note with Dr. Fuller Plan from last month. Please ask him to have labs and stool studies: CBC, BMP, GI pathogen panel, C diff, and fecal calprotectin today.  Should wait to get these results prior to start Imodium.  Continue Apriso at his current dose.   Drink a lot of liquids that have water, salt, and sugar. Good choices are water mixed with juice, flavored soda, and soup broth. If you are drinking enough, your urine will be light yellow or almost clear.  Avoid high fat foods and dairy, as they can make diarrhea worse.   Will ask Dr. Fuller Plan for additional input on his return. Thank you.

## 2019-04-15 NOTE — Telephone Encounter (Signed)
Dr. Tarri Glenn sent telephone note of critical lab value by this RN.

## 2019-04-15 NOTE — ED Provider Notes (Signed)
[facility] EMERGENCY DEPARTMENT Provider Note   CSN: [id number] Arrival date & time: 04/15/19  1636    History   Chief Complaint Chief Complaint  Patient presents with  . Abnormal Lab    HPI [name] is a 73 y.o. male.     HPI Pt has been having trouble with diarrhea, a few times per day for the last few weeks.  He had been on abx for a few weeks for dental work.  He has been feeling run down and fatigued.   No abdominal pain.  No vomiting.  No CP, no trouble urinating but his urine has looked dark.    He went to his doctor yesterday, they did blood tests and sent him to the ED. Past Medical History:  Diagnosis Date  . Atrial fibrillation ([place])    initial diagnoses 2012  . B12 deficiency    per patient previously taking shots  . CORONARY ARTERY DISEASE 04/12/2007   2 stents last in 2006.   Marland Kitchen DIABETES MELLITUS, TYPE II 04/16/2007  . Diverticulosis of colon (without mention of hemorrhage)   . HYPERLIPIDEMIA 04/16/2007   reveal study. not sure of medication  . HYPERTENSION 04/12/2007  . HYPOTHYROIDISM 04/12/2007  . Ischemic cardiomyopathy    cath 35-40%  . MYOCARDIAL INFARCTION, HX OF 04/12/2007  . OBESITY 06/16/2009  . SLEEP APNEA 04/12/2007   CPAP  . Stroke ([place])   . ULCERATIVE COLITIS, LEFT SIDED 11/23/2010    Patient Active Problem List   Diagnosis Date Noted  . Acute on chronic combined systolic and diastolic CHF (congestive heart failure) ([place]) 02/07/2018  . Former smoker 08/11/2016  . MCI (mild cognitive impairment) 01/19/2016  . History of transient ischemic attack (TIA) 10/24/2015  . Primary osteoarthritis of left knee 02/09/2015  . CHF (congestive heart failure), NYHA class II ([place]) 05/09/2014  . Allergic rhinitis 06/25/2012  . Ulcerative pancolitis without complication ([place]) 58/06/9832  . GERD (gastroesophageal reflux disease) 08/09/2011  . Morbid obesity ([place]) 08/09/2011  . Atrial fibrillation ([place]) 12/08/2010  . VITAMIN B12 DEFICIENCY  11/29/2010  . Type II diabetes mellitus with peripheral circulatory disorder ([place]) 04/16/2007  . Hyperlipidemia 04/16/2007  . Hypothyroidism 04/12/2007  . Essential hypertension 04/12/2007  . CAD (coronary artery disease), native coronary artery 04/12/2007  . OSA (obstructive sleep apnea) 04/12/2007    Past Surgical History:  Procedure Laterality Date  . CARDIAC CATHETERIZATION  10/2002   STENT. 2 stents Dr. Maurene Capes  . CARDIAC CATHETERIZATION N/A 10/28/2015   Procedure: Right/Left Heart Cath and Coronary Angiography;  Surgeon: Sherren Mocha, MD;  Location: [facility];  Service: Cardiovascular;  Laterality: N/A;  . CORONARY STENT PLACEMENT  2008   LAD   . RIGHT/LEFT HEART CATH AND CORONARY ANGIOGRAPHY N/A 06/01/2018   Procedure: RIGHT/LEFT HEART CATH AND CORONARY ANGIOGRAPHY;  Surgeon: Sherren Mocha, MD;  Location: [facility];  Service: Cardiovascular;  Laterality: N/A;  . TONSILLECTOMY          Home Medications    Prior to Admission medications   Medication Sig Start Date End Date Taking? Authorizing Provider  atorvastatin (LIPITOR) 20 MG tablet TAKE ONE TABLET (20MG TOTAL) BY MOUTH DAILY 03/28/19   Sherren Mocha, MD  carvedilol (COREG) 25 MG tablet TAKE ONE TABLET BY MOUTH TWICE A DAY 03/01/19   Sherren Mocha, MD  clopidogrel (PLAVIX) 75 MG tablet Take 1 tablet (75 mg total) by mouth daily. 02/28/19   Marin Olp, MD  empagliflozin (JARDIANCE) 10  MG TABS tablet TAKE ONE (1) TABLET BY MOUTH EVERY DAY 02/27/19   Philemon Kingdom, MD  fish oil-omega-3 fatty acids 1000 MG capsule Take 2 g by mouth daily.     [provider]  furosemide (LASIX) 40 MG tablet Take 1 tablet (40 mg total) by mouth daily. Take extra tablet once daily in the PM as needed for weight gain 3 lbs in 24 hrs or swelling. 05/10/18 05/05/19  Bensimhon, Shaune Pascal, MD  glipiZIDE (GLUCOTROL) 10 MG tablet TAKE ONE TABLET BY MOUTH TWICE A DAY 03/01/19   Philemon Kingdom, MD   glucosamine-chondroitin 500-400 MG tablet Take 1 tablet by mouth daily as needed (knee flare up or pain).    [provider]  glucose blood test strip Use as instructed to test 4 time daily 06/21/18   Philemon Kingdom, MD  isosorbide mononitrate (IMDUR) 60 MG 24 hr tablet TAKE ONE (1) TABLET BY MOUTH EVERY DAY 06/13/18   Sherren Mocha, MD  mesalamine (APRISO) 0.375 g 24 hr capsule Take 4 capsules (1.5 g total) by mouth daily. 03/14/18   Ladene Artist, MD  metFORMIN (GLUCOPHAGE) 1000 MG tablet TAKE ONE TABLET BY MOUTH TWICE A DAY WITH A MEAL 01/16/19   Philemon Kingdom, MD  PRADAXA 150 MG CAPS capsule TAKE ONE (1) CAPSULE BY MOUTH TWICE A DAY. (EVERY 12 HOURS.) (EVERY 12 HOURS) 01/16/19   Allred, Jeneen Rinks, MD  sacubitril-valsartan (ENTRESTO) 97-103 MG Take 1 tablet by mouth 2 (two) times daily. 02/12/19   Bensimhon, Shaune Pascal, MD  sitaGLIPtin (JANUVIA) 100 MG tablet TAKE ONE (1) TABLET BY MOUTH EVERY DAY 01/16/19   Philemon Kingdom, MD  spironolactone (ALDACTONE) 25 MG tablet TAKE ONE TABLET (25MG TOTAL) BY MOUTH DAILY 04/11/19   Sherren Mocha, MD  SYNTHROID 50 MCG tablet TAKE 1 TABLET (50 MG TOTAL) BY MOUTH DAILY BEFORE BREAKFAST 12/13/18   Philemon Kingdom, MD    Family History Family History  Problem Relation Age of Onset  . Heart attack Mother        mid 31s  . Heart disease Father        H/O CAD, CABG, VALVE SURGERY  . Hypertension Brother   . Obesity Brother   . Stomach cancer Maternal Aunt   . Stomach cancer Paternal Grandmother        ? colon     Social History Social History   Tobacco Use  . Smoking status: Former Smoker    Packs/day: 1.00    Years: 5.00    Pack years: 5.00    Types: Cigarettes    Quit date: 04/13/1966    Years since quitting: 53.0  . Smokeless tobacco: Never Used  Substance Use Topics  . Alcohol use: No    Alcohol/week: 0.0 standard drinks    Comment: no more beer  . Drug use: No     Allergies   Patient has no known allergies.   Review of  Systems Review of Systems  All other systems reviewed and are negative.    Physical Exam Updated Vital Signs BP 113/78   Pulse 99   Temp 99.3 F (37.4 C) (Oral)   Resp 18   Ht 1.727 m (5' 8" )   Wt 115.7 kg   SpO2 97%   BMI 38.77 kg/m   Physical Exam Vitals signs and nursing note reviewed.  Constitutional:      General: He is not in acute distress.    Appearance: He is well-developed.  HENT:     Head:  Normocephalic and atraumatic.     Right Ear: External ear normal.     Left Ear: External ear normal.  Eyes:     General: No scleral icterus.       Right eye: No discharge.        Left eye: No discharge.     Conjunctiva/sclera: Conjunctivae normal.  Neck:     Musculoskeletal: Neck supple.     Trachea: No tracheal deviation.  Cardiovascular:     Rate and Rhythm: Normal rate and regular rhythm.  Pulmonary:     Effort: Pulmonary effort is normal. No respiratory distress.     Breath sounds: Normal breath sounds. No stridor. No wheezing or rales.  Abdominal:     General: Bowel sounds are normal. There is no distension.     Palpations: Abdomen is soft.     Tenderness: There is no abdominal tenderness. There is no guarding or rebound.  Musculoskeletal:        General: No tenderness.  Skin:    General: Skin is warm and dry.     Findings: No rash.  Neurological:     Mental Status: He is alert.     Cranial Nerves: No cranial nerve deficit (no facial droop, extraocular movements intact, no slurred speech).     Sensory: No sensory deficit.     Motor: No abnormal muscle tone or seizure activity.     Coordination: Coordination normal.      ED Treatments / Results  Labs (all labs ordered are listed, but only abnormal results are displayed) Labs Reviewed  COMPREHENSIVE METABOLIC PANEL - Abnormal; Notable for the following components:      Result Value   Potassium 2.5 (*)    Glucose, Bld 164 (*)    Creatinine, Ser 1.25 (*)    Calcium 8.4 (*)    Total Protein 6.2 (*)     Albumin 3.4 (*)    Total Bilirubin 1.4 (*)    GFR calc non Af Amer 57 (*)    All other components within normal limits  CBC - Abnormal; Notable for the following components:   WBC 20.6 (*)    All other components within normal limits  GASTROINTESTINAL PANEL BY PCR, STOOL (REPLACES STOOL CULTURE)  SARS CORONAVIRUS 2 (HOSPITAL ORDER, [facility])  LIPASE, BLOOD  MAGNESIUM  URINALYSIS, ROUTINE W REFLEX MICROSCOPIC    EKG EKG Interpretation  Date/Time:  Monday April 15 2019 16:57:17 EDT Ventricular Rate:  108 PR Interval:    QRS Duration: 132 QT Interval:  358 QTC Calculation: 479 R Axis:   -39 Text Interpretation:  Atrial fibrillation with rapid ventricular response Left axis deviation Left ventricular hypertrophy with QRS widening T wave abnormality, consider lateral ischemia or digitalis effect Abnormal ECG No significant change since last tracing Confirmed by Dorie Rank [phone]) on 04/15/2019 6:42:04 PM   Radiology No results found.  Procedures Procedures (including critical care time)  Medications Ordered in ED Medications  potassium chloride 10 mEq in 100 mL IVPB (10 mEq Intravenous New Bag/Given 04/15/19 1925)  sodium chloride 0.9 % bolus 500 mL (has no administration in time range)    Followed by  0.9 %  sodium chloride infusion (has no administration in time range)  potassium chloride SA (K-DUR) CR tablet 40 mEq (40 mEq Oral Given 04/15/19 1930)     Initial Impression / Assessment and Plan / ED Course  I have reviewed the triage vital signs and the nursing notes.  Pertinent labs &  imaging results that were available during my care of the patient were reviewed by me and considered in my medical decision making (see chart for details).  Clinical Course as of Apr 14 2057  [facility] Apr 15, 2019  2056 Labs notable for hypokalemia and a leukocytosis.   [JK]    Clinical Course User Index [JK] Dorie Rank, MD     Patient presented to ED for  evaluation of hypokalemia and dehydration.  Patient's labs do show evidence of an acute kidney injury.  He also has hypokalemia and a significant leukocytosis.  Patient denies any abdominal pain.  I doubt colitis or diverticulitis.  I am concerned about the possibility of C. difficile as he mentions his symptoms all started after taking a course of antibiotics.  Since then he has been having loose stools although recently it has decreased.  Patient has been treated with IV fluids and IV potassium.  C. difficile test is pending.  I will consult the medical service for admission and further treatment.  Final Clinical Impressions(s) / ED Diagnoses   Final diagnoses:  AKI (acute kidney injury) ([place])  Hypokalemia  Diarrhea, unspecified type      Dorie Rank, MD 04/15/19 2058

## 2019-04-15 NOTE — Progress Notes (Deleted)
Phone [phone]   Subjective:  [name] is a 73 y.o. year old very pleasant male patient who presents for/with See problem oriented charting No chief complaint on file.  ROS- ***   Past Medical History-  Patient Active Problem List   Diagnosis Date Noted  . Acute on chronic combined systolic and diastolic CHF (congestive heart failure) ([place]) 02/07/2018  . Former smoker 08/11/2016  . MCI (mild cognitive impairment) 01/19/2016  . History of transient ischemic attack (TIA) 10/24/2015  . Primary osteoarthritis of left knee 02/09/2015  . CHF (congestive heart failure), NYHA class II ([place]) 05/09/2014  . Allergic rhinitis 06/25/2012  . Ulcerative pancolitis without complication ([place]) 38/88/2800  . GERD (gastroesophageal reflux disease) 08/09/2011  . Morbid obesity ([place]) 08/09/2011  . Atrial fibrillation ([place]) 12/08/2010  . VITAMIN B12 DEFICIENCY 11/29/2010  . Type II diabetes mellitus with peripheral circulatory disorder ([place]) 04/16/2007  . Hyperlipidemia 04/16/2007  . Hypothyroidism 04/12/2007  . Essential hypertension 04/12/2007  . CAD (coronary artery disease), native coronary artery 04/12/2007  . OSA (obstructive sleep apnea) 04/12/2007    Medications- reviewed and updated Current Outpatient Medications  Medication Sig Dispense Refill  . atorvastatin (LIPITOR) 20 MG tablet TAKE ONE TABLET (20MG TOTAL) BY MOUTH DAILY 90 tablet 1  . carvedilol (COREG) 25 MG tablet TAKE ONE TABLET BY MOUTH TWICE A DAY 180 tablet 2  . clopidogrel (PLAVIX) 75 MG tablet Take 1 tablet (75 mg total) by mouth daily. 90 tablet 3  . empagliflozin (JARDIANCE) 10 MG TABS tablet TAKE ONE (1) TABLET BY MOUTH EVERY DAY 90 tablet 3  . fish oil-omega-3 fatty acids 1000 MG capsule Take 2 g by mouth daily.     . furosemide (LASIX) 40 MG tablet Take 1 tablet (40 mg total) by mouth daily. Take extra tablet once daily in the PM as needed for weight gain 3 lbs in 24 hrs or swelling. 30 tablet 11  . glipiZIDE  (GLUCOTROL) 10 MG tablet TAKE ONE TABLET BY MOUTH TWICE A DAY 180 tablet 2  . glucosamine-chondroitin 500-400 MG tablet Take 1 tablet by mouth daily as needed (knee flare up or pain).    Marland Kitchen glucose blood test strip Use as instructed to test 4 time daily 400 each 3  . isosorbide mononitrate (IMDUR) 60 MG 24 hr tablet TAKE ONE (1) TABLET BY MOUTH EVERY DAY 90 tablet 3  . mesalamine (APRISO) 0.375 g 24 hr capsule Take 4 capsules (1.5 g total) by mouth daily. 120 capsule 11  . metFORMIN (GLUCOPHAGE) 1000 MG tablet TAKE ONE TABLET BY MOUTH TWICE A DAY WITH A MEAL 180 tablet 1  . PRADAXA 150 MG CAPS capsule TAKE ONE (1) CAPSULE BY MOUTH TWICE A DAY. (EVERY 12 HOURS.) (EVERY 12 HOURS) 60 capsule 6  . sacubitril-valsartan (ENTRESTO) 97-103 MG Take 1 tablet by mouth 2 (two) times daily. 60 tablet 6  . sitaGLIPtin (JANUVIA) 100 MG tablet TAKE ONE (1) TABLET BY MOUTH EVERY DAY 90 tablet 2  . spironolactone (ALDACTONE) 25 MG tablet TAKE ONE TABLET (25MG TOTAL) BY MOUTH DAILY 90 tablet 1  . SYNTHROID 50 MCG tablet TAKE 1 TABLET (50 MG TOTAL) BY MOUTH DAILY BEFORE BREAKFAST 90 tablet 3   No current facility-administered medications for this visit.      Objective:  There were no vitals taken for this visit. Gen: NAD, resting comfortably CV: RRR no murmurs rubs or gallops Lungs: CTAB no crackles, wheeze, rhonchi Abdomen: soft/nontender/nondistended/normal bowel sounds. No rebound or guarding.  Ext:  no edema Skin: warm, dry Neuro: grossly normal, moves all extremities  ***    Assessment and Plan  ***  S: *** A/P:  No problem-specific Assessment & Plan notes found for this encounter.   Future Appointments  Date Time Provider [place]  04/15/2019  4:20 PM Marin Olp, MD [facility]  04/26/2019  2:30 PM Zehr, Laban Emperor, PA-C [facility]  07/03/2019 10:15 AM Philemon Kingdom, MD [facility] None  08/26/2019 11:00 AM Marin Olp, MD [facility]   No follow-ups on  file.  Lab/Order associations: No diagnosis found.  No orders of the defined types were placed in this encounter.   Return precautions advised.  Jasper Loser, CMA

## 2019-04-15 NOTE — Telephone Encounter (Signed)
Please contact granddaughter Sharyn Lull at [phone] to set up covid testing.

## 2019-04-15 NOTE — Telephone Encounter (Signed)
Notified Brittini of critical

## 2019-04-15 NOTE — ED Notes (Signed)
Pt made aware of the need for urine.

## 2019-04-16 DIAGNOSIS — Z7984 Long term (current) use of oral hypoglycemic drugs: Secondary | ICD-10-CM | POA: Diagnosis not present

## 2019-04-16 DIAGNOSIS — I5042 Chronic combined systolic (congestive) and diastolic (congestive) heart failure: Secondary | ICD-10-CM | POA: Diagnosis not present

## 2019-04-16 DIAGNOSIS — Z1159 Encounter for screening for other viral diseases: Secondary | ICD-10-CM | POA: Diagnosis not present

## 2019-04-16 DIAGNOSIS — E860 Dehydration: Secondary | ICD-10-CM | POA: Diagnosis present

## 2019-04-16 DIAGNOSIS — E876 Hypokalemia: Secondary | ICD-10-CM | POA: Diagnosis present

## 2019-04-16 DIAGNOSIS — Z87891 Personal history of nicotine dependence: Secondary | ICD-10-CM | POA: Diagnosis not present

## 2019-04-16 DIAGNOSIS — I110 Hypertensive heart disease with heart failure: Secondary | ICD-10-CM | POA: Diagnosis present

## 2019-04-16 DIAGNOSIS — Z800 Family history of malignant neoplasm of digestive organs: Secondary | ICD-10-CM | POA: Diagnosis not present

## 2019-04-16 DIAGNOSIS — G4733 Obstructive sleep apnea (adult) (pediatric): Secondary | ICD-10-CM | POA: Diagnosis not present

## 2019-04-16 DIAGNOSIS — Z79899 Other long term (current) drug therapy: Secondary | ICD-10-CM | POA: Diagnosis not present

## 2019-04-16 DIAGNOSIS — I2510 Atherosclerotic heart disease of native coronary artery without angina pectoris: Secondary | ICD-10-CM | POA: Diagnosis present

## 2019-04-16 DIAGNOSIS — I447 Left bundle-branch block, unspecified: Secondary | ICD-10-CM | POA: Diagnosis present

## 2019-04-16 DIAGNOSIS — I10 Essential (primary) hypertension: Secondary | ICD-10-CM | POA: Diagnosis not present

## 2019-04-16 DIAGNOSIS — R197 Diarrhea, unspecified: Secondary | ICD-10-CM | POA: Diagnosis not present

## 2019-04-16 DIAGNOSIS — Z6838 Body mass index (BMI) 38.0-38.9, adult: Secondary | ICD-10-CM | POA: Diagnosis not present

## 2019-04-16 DIAGNOSIS — K5100 Ulcerative (chronic) pancolitis without complications: Secondary | ICD-10-CM | POA: Diagnosis not present

## 2019-04-16 DIAGNOSIS — A0472 Enterocolitis due to Clostridium difficile, not specified as recurrent: Secondary | ICD-10-CM | POA: Diagnosis present

## 2019-04-16 DIAGNOSIS — E1151 Type 2 diabetes mellitus with diabetic peripheral angiopathy without gangrene: Secondary | ICD-10-CM | POA: Diagnosis not present

## 2019-04-16 DIAGNOSIS — E039 Hypothyroidism, unspecified: Secondary | ICD-10-CM | POA: Diagnosis present

## 2019-04-16 DIAGNOSIS — Z79890 Hormone replacement therapy (postmenopausal): Secondary | ICD-10-CM | POA: Diagnosis not present

## 2019-04-16 DIAGNOSIS — I4821 Permanent atrial fibrillation: Secondary | ICD-10-CM | POA: Diagnosis not present

## 2019-04-16 DIAGNOSIS — N179 Acute kidney failure, unspecified: Secondary | ICD-10-CM | POA: Diagnosis not present

## 2019-04-16 DIAGNOSIS — I252 Old myocardial infarction: Secondary | ICD-10-CM | POA: Diagnosis not present

## 2019-04-16 DIAGNOSIS — E785 Hyperlipidemia, unspecified: Secondary | ICD-10-CM | POA: Diagnosis present

## 2019-04-16 DIAGNOSIS — Z955 Presence of coronary angioplasty implant and graft: Secondary | ICD-10-CM | POA: Diagnosis not present

## 2019-04-16 LAB — GASTROINTESTINAL PANEL BY PCR, STOOL (REPLACES STOOL CULTURE)

## 2019-04-16 LAB — CBC WITH DIFFERENTIAL/PLATELET
Abs Immature Granulocytes: 0.1 10*3/uL — ABNORMAL HIGH (ref 0.00–0.07)
Basophils Absolute: 0 10*3/uL (ref 0.0–0.1)
Basophils Relative: 0 %
Eosinophils Absolute: 0.1 10*3/uL (ref 0.0–0.5)
Eosinophils Relative: 0 %
HCT: 42.1 % (ref 39.0–52.0)
Hemoglobin: 14 g/dL (ref 13.0–17.0)
Immature Granulocytes: 1 %
Lymphocytes Relative: 6 %
Lymphs Abs: 1 10*3/uL (ref 0.7–4.0)
MCH: 27.3 pg (ref 26.0–34.0)
MCHC: 33.3 g/dL (ref 30.0–36.0)
MCV: 82.2 fL (ref 80.0–100.0)
Monocytes Absolute: 1.1 10*3/uL — ABNORMAL HIGH (ref 0.1–1.0)
Monocytes Relative: 7 %
Neutro Abs: 14.7 10*3/uL — ABNORMAL HIGH (ref 1.7–7.7)
Neutrophils Relative %: 86 %
Platelets: 189 10*3/uL (ref 150–400)
RBC: 5.12 MIL/uL (ref 4.22–5.81)
RDW: 14.8 % (ref 11.5–15.5)
WBC: 17.1 10*3/uL — ABNORMAL HIGH (ref 4.0–10.5)
nRBC: 0 % (ref 0.0–0.2)

## 2019-04-16 LAB — COMPREHENSIVE METABOLIC PANEL
ALT: 10 U/L (ref 0–44)
AST: 11 U/L — ABNORMAL LOW (ref 15–41)
Albumin: 2.8 g/dL — ABNORMAL LOW (ref 3.5–5.0)
Alkaline Phosphatase: 56 U/L (ref 38–126)
Anion gap: 12 (ref 5–15)
BUN: 11 mg/dL (ref 8–23)
CO2: 22 mmol/L (ref 22–32)
Calcium: 7.8 mg/dL — ABNORMAL LOW (ref 8.9–10.3)
Chloride: 105 mmol/L (ref 98–111)
Creatinine, Ser: 1.09 mg/dL (ref 0.61–1.24)
GFR calc Af Amer: >60 mL/min (ref >60)
GFR calc non Af Amer: >60 mL/min (ref >60)
Glucose, Bld: 174 mg/dL — ABNORMAL HIGH (ref 70–99)
Potassium: 2.6 mmol/L — CL (ref 3.5–5.1)
Sodium: 139 mmol/L (ref 135–145)
Total Bilirubin: 1.1 mg/dL (ref 0.3–1.2)
Total Protein: 5.4 g/dL — ABNORMAL LOW (ref 6.5–8.1)

## 2019-04-16 LAB — URINALYSIS, ROUTINE W REFLEX MICROSCOPIC
Bilirubin Urine: NEGATIVE
Glucose, UA: 50 mg/dL — AB
Ketones, ur: 5 mg/dL — AB
Nitrite: NEGATIVE
Protein, ur: 100 mg/dL — AB
Specific Gravity, Urine: 1.029 (ref 1.005–1.030)
pH: 6 (ref 5.0–8.0)

## 2019-04-16 LAB — C DIFFICILE QUICK SCREEN W PCR REFLEX
C Diff antigen: POSITIVE — AB
C Diff toxin: NEGATIVE

## 2019-04-16 LAB — HEPARIN LEVEL (UNFRACTIONATED): Heparin Unfractionated: 0.3 IU/mL (ref 0.30–0.70)

## 2019-04-16 LAB — GLUCOSE, CAPILLARY
Glucose-Capillary: 180 mg/dL — ABNORMAL HIGH (ref 70–99)
Glucose-Capillary: 127 mg/dL — ABNORMAL HIGH (ref 70–99)
Glucose-Capillary: 134 mg/dL — ABNORMAL HIGH (ref 70–99)
Glucose-Capillary: 140 mg/dL — ABNORMAL HIGH (ref 70–99)

## 2019-04-16 LAB — MAGNESIUM: Magnesium: 1.8 mg/dL (ref 1.7–2.4)

## 2019-04-16 LAB — CLOSTRIDIUM DIFFICILE BY PCR, REFLEXED: Toxigenic C. Difficile by PCR: POSITIVE — AB

## 2019-04-16 LAB — C-REACTIVE PROTEIN: CRP: 11 mg/dL — ABNORMAL HIGH (ref <1.0)

## 2019-04-16 LAB — TYPE AND SCREEN
ABO/RH(D): A POS
Antibody Screen: NEGATIVE

## 2019-04-16 LAB — ABO/RH: ABO/RH(D): A POS

## 2019-04-16 MED ORDER — VANCOMYCIN 50 MG/ML ORAL SOLUTION
125.0000 mg | Freq: Four times a day (QID) | ORAL | Status: DC
  Administered 2019-04-16 – 2019-04-18 (×7): 125 mg via ORAL
  Filled 2019-04-16 (×9): qty 2.5

## 2019-04-16 MED ORDER — HEPARIN (PORCINE) 25000 UT/250ML-% IV SOLN
1650.0000 [IU]/h | INTRAVENOUS | Status: DC
  Administered 2019-04-16 (×2): 1500 [IU]/h via INTRAVENOUS
  Filled 2019-04-16 (×2): qty 250

## 2019-04-16 MED ORDER — POTASSIUM CHLORIDE CRYS ER 20 MEQ PO TBCR
40.0000 meq | EXTENDED_RELEASE_TABLET | Freq: Once | ORAL | Status: AC
  Administered 2019-04-16: 40 meq via ORAL
  Filled 2019-04-16: qty 2

## 2019-04-16 MED ORDER — POTASSIUM CHLORIDE IN NACL 20-0.9 MEQ/L-% IV SOLN
INTRAVENOUS | Status: DC
  Administered 2019-04-16 – 2019-04-18 (×3): via INTRAVENOUS
  Filled 2019-04-16 (×4): qty 1000

## 2019-04-16 MED ORDER — MAGNESIUM SULFATE 2 GM/50ML IV SOLN
2.0000 g | Freq: Once | INTRAVENOUS | Status: AC
  Administered 2019-04-16: 2 g via INTRAVENOUS
  Filled 2019-04-16: qty 50

## 2019-04-16 NOTE — Progress Notes (Signed)
Patient refused CPAP for the night.  RT will continue to monitor.

## 2019-04-16 NOTE — Consult Note (Addendum)
[facility] Consult: 10:02 AM 04/16/2019  LOS: 0 days    Referring Provider: Dr Eliseo Squires  Primary Care Physician:  Marin Olp, MD Primary Gastroenterologist:  Dr. Fuller Plan     Reason for Consultation:  ? Ulcerative colitis flare.     HPI: JENSYN CAMBRIA is a 73 y.o. male.  PMH TIA, CVA.  Atrial fibrillation.  CAD.  CHF; EF 25 -30% on echo 09/2018.  OSA.  DM.  Chronic Plavix and Pradaxa. Ulcerative pancolitis.  Medical management consists of Apriso 1.5 g daily.  No colonoscopy since 11/2010.  At that time he had pan UC without polyps or bleeding.  Surveillance colonoscopies have been delayed  by the requirement for chronic Plavix and Pradaxa  Had a total of 5 teeth removed in May.  Started a 2-week course of unspecified once daily antibiotic which he finished up by early June.  Baseline normal stools are soft, unformed, brown about 4 or 5/day.  This has not changed.  What has changed is that he now sees streaks of blood in the stool within the past couple of weeks.  No abdominal pain, no nausea.  Appetite diminished thinks he is lost about 10 -12#since late March, early May.  He does not describe a lot of oral bleeding.  Denies chills and fever. Patient came to the ED complaining of feeling rundown and fatigue for at least several days.  No syncope.  No dizziness.  No chest pain.  No shortness of breath.  Hgb 14.  MCV 82 WBCs 20 >> 17.1. Potassium 2.6. Resolved mild AKI.  LFTs normal. CRP 11. INR 1.4. COVID-19 negative Stool pathogen panel PCR in process     Past Medical History:  Diagnosis Date  . Atrial fibrillation ([place])    initial diagnoses 2012  . B12 deficiency    per patient previously taking shots  . CORONARY ARTERY DISEASE 04/12/2007   2 stents last in 2006.   Marland Kitchen DIABETES MELLITUS, TYPE II  04/16/2007  . Diverticulosis of colon (without mention of hemorrhage)   . HYPERLIPIDEMIA 04/16/2007   reveal study. not sure of medication  . HYPERTENSION 04/12/2007  . HYPOTHYROIDISM 04/12/2007  . Ischemic cardiomyopathy    cath 35-40%  . MYOCARDIAL INFARCTION, HX OF 04/12/2007  . OBESITY 06/16/2009  . SLEEP APNEA 04/12/2007   CPAP  . Stroke ([place])   . ULCERATIVE COLITIS, LEFT SIDED 11/23/2010    Past Surgical History:  Procedure Laterality Date  . CARDIAC CATHETERIZATION  10/2002   STENT. 2 stents Dr. Maurene Capes  . CARDIAC CATHETERIZATION N/A 10/28/2015   Procedure: Right/Left Heart Cath and Coronary Angiography;  Surgeon: Sherren Mocha, MD;  Location: [facility];  Service: Cardiovascular;  Laterality: N/A;  . CORONARY STENT PLACEMENT  2008   LAD   . RIGHT/LEFT HEART CATH AND CORONARY ANGIOGRAPHY N/A 06/01/2018   Procedure: RIGHT/LEFT HEART CATH AND CORONARY ANGIOGRAPHY;  Surgeon: Sherren Mocha, MD;  Location: [facility];  Service: Cardiovascular;  Laterality: N/A;  . TONSILLECTOMY      Prior to Admission medications  Medication Sig Start Date End Date Taking? Authorizing Provider  acetaminophen (TYLENOL) 500 MG tablet Take 1,000 mg by mouth every 6 (six) hours as needed for headache (pain).   Yes [provider]  atorvastatin (LIPITOR) 20 MG tablet TAKE ONE TABLET (20MG TOTAL) BY MOUTH DAILY Patient taking differently: Take 20 mg by mouth daily with breakfast.  03/28/19  Yes Sherren Mocha, MD  carvedilol (COREG) 25 MG tablet TAKE ONE TABLET BY MOUTH TWICE A DAY Patient taking differently: Take 25 mg by mouth 2 (two) times daily with a meal.  03/01/19  Yes Sherren Mocha, MD  clopidogrel (PLAVIX) 75 MG tablet Take 1 tablet (75 mg total) by mouth daily. Patient taking differently: Take 75 mg by mouth daily with breakfast.  02/28/19  Yes Marin Olp, MD  Coenzyme Q10 (COQ10 PO) Take 1 capsule by mouth daily with supper.   Yes [provider]  empagliflozin  (JARDIANCE) 10 MG TABS tablet TAKE ONE (1) TABLET BY MOUTH EVERY DAY Patient taking differently: Take 10 mg by mouth daily with breakfast.  02/27/19  Yes Philemon Kingdom, MD  fish oil-omega-3 fatty acids 1000 MG capsule Take 1 g by mouth daily with supper.    Yes [provider]  furosemide (LASIX) 40 MG tablet Take 1 tablet (40 mg total) by mouth daily. Take extra tablet once daily in the PM as needed for weight gain 3 lbs in 24 hrs or swelling. Patient taking differently: Take 40 mg by mouth See admin instructions. Take one tablet (40 mg) by mouth daily with breakfast, may take an extra tablet once daily in the evening as needed for weight gain 3 lbs in 24 hrs or swelling. 05/10/18 05/05/19 Yes Bensimhon, Shaune Pascal, MD  glipiZIDE (GLUCOTROL) 10 MG tablet TAKE ONE TABLET BY MOUTH TWICE A DAY Patient taking differently: Take 10 mg by mouth 2 (two) times daily before a meal.  03/01/19  Yes Philemon Kingdom, MD  glucosamine-chondroitin 500-400 MG tablet Take 1 tablet by mouth daily as needed (knee flare up or pain).   Yes [provider]  isosorbide mononitrate (IMDUR) 60 MG 24 hr tablet TAKE ONE (1) TABLET BY MOUTH EVERY DAY Patient taking differently: Take 60 mg by mouth daily with breakfast.  06/13/18  Yes Sherren Mocha, MD  mesalamine (APRISO) 0.375 g 24 hr capsule Take 4 capsules (1.5 g total) by mouth daily. Patient taking differently: Take 1.5 g by mouth daily with breakfast.  03/14/18  Yes Ladene Artist, MD  metFORMIN (GLUCOPHAGE) 1000 MG tablet TAKE ONE TABLET BY MOUTH TWICE A DAY WITH A MEAL Patient taking differently: Take 1,000 mg by mouth 2 (two) times daily with a meal.  01/16/19  Yes Philemon Kingdom, MD  PRADAXA 150 MG CAPS capsule TAKE ONE (1) CAPSULE BY MOUTH TWICE A DAY. (EVERY 12 HOURS.) (EVERY 12 HOURS) Patient taking differently: Take 150 mg by mouth 2 (two) times daily with a meal.  01/16/19  Yes Allred, Jeneen Rinks, MD  sacubitril-valsartan (ENTRESTO) 97-103 MG Take 1  tablet by mouth 2 (two) times daily. Patient taking differently: Take 1 tablet by mouth 2 (two) times daily with a meal.  02/12/19  Yes Bensimhon, Shaune Pascal, MD  sitaGLIPtin (JANUVIA) 100 MG tablet TAKE ONE (1) TABLET BY MOUTH EVERY DAY Patient taking differently: Take 100 mg by mouth daily with breakfast.  01/16/19  Yes Philemon Kingdom, MD  spironolactone (ALDACTONE) 25 MG tablet TAKE ONE TABLET (25MG TOTAL) BY MOUTH DAILY Patient taking differently: Take 25  mg by mouth daily with breakfast.  04/11/19  Yes Sherren Mocha, MD  SYNTHROID 50 MCG tablet TAKE 1 TABLET (50 MG TOTAL) BY MOUTH DAILY BEFORE BREAKFAST Patient taking differently: Take 50 mcg by mouth daily before breakfast.  12/13/18  Yes Philemon Kingdom, MD  glucose blood test strip Use as instructed to test 4 time daily 06/21/18   Philemon Kingdom, MD    Scheduled Meds: . atorvastatin  20 mg Oral Q breakfast  . carvedilol  25 mg Oral BID WC  . clopidogrel  75 mg Oral Q breakfast  . insulin aspart  0-9 Units Subcutaneous TID WC  . isosorbide mononitrate  60 mg Oral Q breakfast  . levothyroxine  50 mcg Oral QAC breakfast  . mesalamine  2.4 g Oral Q breakfast  . omega-3 acid ethyl esters  1 g Oral Q supper   Infusions: . sodium chloride 75 mL/hr at 04/16/19 0007  . heparin 1,500 Units/hr (04/16/19 0530)  . magnesium sulfate bolus IVPB     PRN Meds: acetaminophen **OR** acetaminophen   Allergies as of 04/15/2019  . (No Known Allergies)    Family History  Problem Relation Age of Onset  . Heart attack Mother        mid 67s  . Heart disease Father        H/O CAD, CABG, VALVE SURGERY  . Hypertension Brother   . Obesity Brother   . Stomach cancer Maternal Aunt   . Stomach cancer Paternal Grandmother        ? colon     Social History   Socioeconomic History  . Marital status: Married    Spouse name: Not on file  . Number of children: 1  . Years of education: Not on file  . Highest education level: Not on file   Occupational History  . Occupation: RETIRED    Employer: RETIRED  Social Needs  . Financial resource strain: Not on file  . Food insecurity    Worry: Not on file    Inability: Not on file  . Transportation needs    Medical: Not on file    Non-medical: Not on file  Tobacco Use  . Smoking status: Former Smoker    Packs/day: 1.00    Years: 5.00    Pack years: 5.00    Types: Cigarettes    Quit date: 04/13/1966    Years since quitting: 53.0  . Smokeless tobacco: Never Used  Substance and Sexual Activity  . Alcohol use: No    Alcohol/week: 0.0 standard drinks    Comment: no more beer  . Drug use: No  . Sexual activity: Not on file  Lifestyle  . Physical activity    Days per week: Not on file    Minutes per session: Not on file  . Stress: Not on file  Relationships  . Social Herbalist on phone: Not on file    Gets together: Not on file    Attends religious service: Not on file    Active member of club or organization: Not on file    Attends meetings of clubs or organizations: Not on file    Relationship status: Not on file  . Intimate partner violence    Fear of current or ex partner: Not on file    Emotionally abused: Not on file    Physically abused: Not on file    Forced sexual activity: Not on file  Other Topics Concern  . Not on file  Social History Narrative   Cardiorehab 3 days a week. 45 minutes to an hour-stationary bike.    GRANDDAUGHTER (MS. 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       Married for 37 years in 2015, married previously for 14 years. Daughter with first wife and 3 grandkids.    Lives alone with wife. Get to see grandkids a lot. New grandchild in middle of 66      Hobbies-yardwork, previously liked to hunt and fish, does some target shooting    REVIEW OF SYSTEMS: Constitutional: Weakness, fatigue. ENT:  No nose bleeds Pulm: No  dyspnea.  No cough. CV:  No palpitations, no LE edema.  No chest pain. GU:  No hematuria, no frequency GI: See HPI. Heme: Other than the blood in the stool, no unusual or excessive bleeding.  Bruises easily but this has not changed in recent times. Transfusions: No epic records of previous transfusions. Neuro:  No headaches, no peripheral tingling or numbness Derm:  No itching, no rash or sores.  Endocrine:  No sweats or chills.  No polyuria or dysuria Immunization: Examination history reviewed.  He is up-to-date on multiple vaccines. Travel:  None beyond local counties in last few months.    PHYSICAL EXAM: Vital signs in last 24 hours: Vitals:   04/16/19 0551 04/16/19 0836  BP: 130/76 123/70  Pulse: 87 86  Resp: 19 18  Temp:  98.4 F (36.9 C)  SpO2: 95% 97%   Wt Readings from Last 3 Encounters:  04/15/19 115.7 kg  02/28/19 121.6 kg  02/27/19 121.6 kg    General: Obese, non-ill-appearing, comfortable WM. Head: No facial asymmetry or swelling.  No signs of head trauma. Eyes: No scleral icterus.  No conjunctival pallor.  EOMI. Ears: No hearing loss Nose: No congestion or discharge Mouth: Full upper denture in place.  Molars missing in the lower jaw.  Mucosa is pink, moist, clear.  Tongue midline. Neck: No JVD, no masses, no thyromegaly. Lungs: Diminished but clear bilaterally.  No labored breathing, no cough. Heart: RRR.  No MRG.  S1, S2 present. Abdomen: Obese, soft.  Not tender or distended.  No HSM, masses, bruits, hernias..   Rectal: Not performed. Musc/Skeltl: No joint redness, swelling or gross deformities. Extremities: CCE. Neurologic: Oriented x3.  Not a great historian, required multiple questions to pin down details of last several weeks.  Moves all 4 limbs.  No tremor, no obvious deficits. Skin: No rash, no sores, no suspicious lesions, no telangiectasia. Nodes: No cervical adenopathy. Psych: Pleasant, cooperative.  Fluid speech.  Intake/Output from previous  day: 07/06 0701 - 07/07 0700 In: 1576.3 [P.O.:480; I.V.:396.7; IV Piggyback:699.6] Out: 100 [Urine:100] Intake/Output this shift: Total I/O In: 120 [P.O.:120] Out: -   LAB RESULTS: Recent Labs    04/15/19 1402 04/15/19 1717 04/16/19 0444  WBC 20.0* 20.6* 17.1*  HGB 15.1 15.0 14.0  HCT 45.1 45.9 42.1  PLT 245.0 262 189   BMET Lab Results  Component Value Date   NA 139 04/16/2019   NA 136 04/15/2019   NA 138 04/15/2019   K 2.6 (LL) 04/16/2019   K 2.5 (LL) 04/15/2019   K 2.7 (LL) 04/15/2019   CL 105 04/16/2019   CL 98 04/15/2019   CL 99 04/15/2019   CO2 22 04/16/2019   CO2 25 04/15/2019   CO2 27 04/15/2019   GLUCOSE  174 (H) 04/16/2019   GLUCOSE 164 (H) 04/15/2019   GLUCOSE 169 (H) 04/15/2019   BUN 11 04/16/2019   BUN 9 04/15/2019   BUN 10 04/15/2019   CREATININE 1.09 04/16/2019   CREATININE 1.25 (H) 04/15/2019   CREATININE 1.00 04/15/2019   CALCIUM 7.8 (L) 04/16/2019   CALCIUM 8.4 (L) 04/15/2019   CALCIUM 8.3 (L) 04/15/2019   LFT Recent Labs    04/15/19 1717 04/16/19 0444  PROT 6.2* 5.4*  ALBUMIN 3.4* 2.8*  AST 16 11*  ALT 12 10  ALKPHOS 72 56  BILITOT 1.4* 1.1   PT/INR Lab Results  Component Value Date   INR 1.40 10/28/2015   INR 1.96 (H) 10/24/2015   INR 1.57 (H) 10/21/2015   Hepatitis Panel No results for input(s): HEPBSAG, HCVAB, HEPAIGM, HEPBIGM in the last 72 hours. C-Diff No components found for: CDIFF Lipase     Component Value Date/Time   LIPASE 20 04/15/2019 1717     RADIOLOGY STUDIES: No results found.    IMPRESSION:   *   Blood in stool in pt with UC.  Last colonoscopy was 8 yrs ago.  Rule out colitis flare, though no other symptoms such as abdominal pain suggest this.  Rule out neoplasia, rule out hemorrhoids. Although he had taken a couple of weeks of unspecified antibiotics in late May, his stool frequency and consistency have not changed.  Low suspicion that this is a superimposed infectious colitis. Fortunately  patient is not anemic.  *  Chronic Plavix and Pradaxa for Afib, CVA, CAD.  Heparin drip now in place.  *   CHF.  Currently well compensated.     *     Hypokalemia.  *   Leukocytosis.  No fever.  Improving.      PLAN:     *   Needs colonoscopy, after 5 d Plavix washout.  Last dose was this morning, so studies could not realistically be performed until 7/10.  Will need temporary discontinuation of heparin for this as well.  *   Dr. Eliseo Squires just now stopped Plavix.      Azucena Freed  04/16/2019, 10:02 AM Phone [phone]  ________________________________________________________________________  Velora Heckler GI MD note:  I personally examined the patient, reviewed the data and agree with the assessment and plan described above.  He gave me a bit different history than Judson Roch, he tells me that his bowels have definitely changed since around May. He's had looser and more urgent stools and has had several episodes of incontinence even. Only very small amount of red blood in the output.  Overall has been feeling very fatigued as well.    I have concern for C. Diff infection which is not checked on inpatient GI pathogen panels. I spoke with his nurse to help with a stand alone C. Diff stool testing that was ordered earlier this morning.  If positive, would treat with oral vancomycin and there would be no need for colonoscopy this admission.   Owens Loffler, MD [facility] Pager [phone]

## 2019-04-16 NOTE — Progress Notes (Signed)
ANTICOAGULATION CONSULT NOTE - Initial Consult  Pharmacy Consult for Heparin Indication: atrial fibrillation  No Known Allergies  Patient Measurements: Height: 5' 8"  (172.7 cm) Weight: 255 lb (115.7 kg) IBW/kg (Calculated) : 68.4 Heparin Dosing Weight: 95 kg  Vital Signs: Temp: 98.5 F (36.9 C) (07/06 2326) Temp Source: Oral (07/06 2326) BP: 139/86 (07/06 2326) Pulse Rate: 107 (07/06 2326)  Labs: Recent Labs    04/15/19 1402 04/15/19 1717  HGB 15.1 15.0  HCT 45.1 45.9  PLT 245.0 262  CREATININE 1.00 1.25*    Estimated Creatinine Clearance: 65 mL/min (A) (by C-G formula based on SCr of 1.25 mg/dL (H)).   Medical History: Past Medical History:  Diagnosis Date  . Atrial fibrillation ([place])    initial diagnoses 2012  . B12 deficiency    per patient previously taking shots  . CORONARY ARTERY DISEASE 04/12/2007   2 stents last in 2006.   Marland Kitchen DIABETES MELLITUS, TYPE II 04/16/2007  . Diverticulosis of colon (without mention of hemorrhage)   . HYPERLIPIDEMIA 04/16/2007   reveal study. not sure of medication  . HYPERTENSION 04/12/2007  . HYPOTHYROIDISM 04/12/2007  . Ischemic cardiomyopathy    cath 35-40%  . MYOCARDIAL INFARCTION, HX OF 04/12/2007  . OBESITY 06/16/2009  . SLEEP APNEA 04/12/2007   CPAP  . Stroke ([place])   . ULCERATIVE COLITIS, LEFT SIDED 11/23/2010    Medications:  Medications Prior to Admission  Medication Sig Dispense Refill Last Dose  . acetaminophen (TYLENOL) 500 MG tablet Take 1,000 mg by mouth every 6 (six) hours as needed for headache (pain).   month ago  . atorvastatin (LIPITOR) 20 MG tablet TAKE ONE TABLET (20MG TOTAL) BY MOUTH DAILY (Patient taking differently: Take 20 mg by mouth daily with breakfast. ) 90 tablet 1 04/14/2019 at am  . carvedilol (COREG) 25 MG tablet TAKE ONE TABLET BY MOUTH TWICE A DAY (Patient taking differently: Take 25 mg by mouth 2 (two) times daily with a meal. ) 180 tablet 2 04/14/2019 at 1600  . clopidogrel (PLAVIX) 75 MG tablet Take 1  tablet (75 mg total) by mouth daily. (Patient taking differently: Take 75 mg by mouth daily with breakfast. ) 90 tablet 3 04/14/2019 at am  . Coenzyme Q10 (COQ10 PO) Take 1 capsule by mouth daily with supper.   04/14/2019 at pm  . empagliflozin (JARDIANCE) 10 MG TABS tablet TAKE ONE (1) TABLET BY MOUTH EVERY DAY (Patient taking differently: Take 10 mg by mouth daily with breakfast. ) 90 tablet 3 04/14/2019 at am  . fish oil-omega-3 fatty acids 1000 MG capsule Take 1 g by mouth daily with supper.    04/14/2019 at pm  . furosemide (LASIX) 40 MG tablet Take 1 tablet (40 mg total) by mouth daily. Take extra tablet once daily in the PM as needed for weight gain 3 lbs in 24 hrs or swelling. (Patient taking differently: Take 40 mg by mouth See admin instructions. Take one tablet (40 mg) by mouth daily with breakfast, may take an extra tablet once daily in the evening as needed for weight gain 3 lbs in 24 hrs or swelling.) 30 tablet 11 04/14/2019 at am  . glipiZIDE (GLUCOTROL) 10 MG tablet TAKE ONE TABLET BY MOUTH TWICE A DAY (Patient taking differently: Take 10 mg by mouth 2 (two) times daily before a meal. ) 180 tablet 2 04/14/2019 at pm  . glucosamine-chondroitin 500-400 MG tablet Take 1 tablet by mouth daily as needed (knee flare up or pain).   few  days ago  . isosorbide mononitrate (IMDUR) 60 MG 24 hr tablet TAKE ONE (1) TABLET BY MOUTH EVERY DAY (Patient taking differently: Take 60 mg by mouth daily with breakfast. ) 90 tablet 3 04/14/2019 at am  . mesalamine (APRISO) 0.375 g 24 hr capsule Take 4 capsules (1.5 g total) by mouth daily. (Patient taking differently: Take 1.5 g by mouth daily with breakfast. ) 120 capsule 11 04/14/2019 at am  . metFORMIN (GLUCOPHAGE) 1000 MG tablet TAKE ONE TABLET BY MOUTH TWICE A DAY WITH A MEAL (Patient taking differently: Take 1,000 mg by mouth 2 (two) times daily with a meal. ) 180 tablet 1 few days ago  . PRADAXA 150 MG CAPS capsule TAKE ONE (1) CAPSULE BY MOUTH TWICE A DAY. (EVERY 12  HOURS.) (EVERY 12 HOURS) (Patient taking differently: Take 150 mg by mouth 2 (two) times daily with a meal. ) 60 capsule 6 04/14/2019 at 1600  . sacubitril-valsartan (ENTRESTO) 97-103 MG Take 1 tablet by mouth 2 (two) times daily. (Patient taking differently: Take 1 tablet by mouth 2 (two) times daily with a meal. ) 60 tablet 6 04/14/2019 at pm  . sitaGLIPtin (JANUVIA) 100 MG tablet TAKE ONE (1) TABLET BY MOUTH EVERY DAY (Patient taking differently: Take 100 mg by mouth daily with breakfast. ) 90 tablet 2 04/14/2019 at am  . spironolactone (ALDACTONE) 25 MG tablet TAKE ONE TABLET (25MG TOTAL) BY MOUTH DAILY (Patient taking differently: Take 25 mg by mouth daily with breakfast. ) 90 tablet 1 04/14/2019 at am  . SYNTHROID 50 MCG tablet TAKE 1 TABLET (50 MG TOTAL) BY MOUTH DAILY BEFORE BREAKFAST (Patient taking differently: Take 50 mcg by mouth daily before breakfast. ) 90 tablet 3 04/14/2019 at am  . glucose blood test strip Use as instructed to test 4 time daily 400 each 3     Assessment: 73 y.o. male with h/o Afib, Pradaxa on hold, for heparin  Goal of Therapy:  Heparin level 0.3-0.7 units/ml Monitor platelets by anticoagulation protocol: Yes   Plan:  Start heparin 1500 units/hr Check heparin level in 8 hours.   Caryl Pina 04/16/2019,5:12 AM

## 2019-04-16 NOTE — Progress Notes (Signed)
Updated granddaughter on condition of patient

## 2019-04-16 NOTE — Progress Notes (Signed)
Progress Note    [name]  ZWC:[id number] DOB: [birth year]  DOA: 04/15/2019 PCP: Marin Olp, MD    Brief Narrative:    Medical records reviewed and are as summarized below:  [name] is an 73 y.o. male  with history of ulcerative colitis, atrial fibrillation, CAD status post stenting, chronic combined systolic and diastolic dysfunction, sleep apnea, hypothyroidism, diabetes mellitus presents to the ER because of increasing weakness over the last 4 days.  Patient's weakness started along with patient having multiple episodes of diarrhea.  Some of the diarrhea was blood-streaked.  Recent long course of antibiotics.  Assessment/Plan:   Active Problems:   Hypothyroidism   Type II diabetes mellitus with peripheral circulatory disorder (HCC)   Hyperlipidemia   Essential hypertension   CAD (coronary artery disease), native coronary artery   OSA (obstructive sleep apnea)   Atrial fibrillation (HCC)   Ulcerative pancolitis without complication (HCC)   Morbid obesity (HCC)   Dehydration   Diarrhea   ARF (acute renal failure) (HCC)   Chronic combined systolic and diastolic CHF (congestive heart failure) (HCC)   Hypokalemia    Severe diarrhea  -could be antibiotic associated diarrhea  or could be ulcerative colitis exacerbation.  -CRP levels / stool studies.  -GI consult  Generalized weakness from dehydration from diarrhea for which we will gently hydrate hold patient's Lasix/spironolactone/Entresto for now. -WATCH CLOSELY FOR VOLUME OVERLOAD  Severe hypokalemia - likely from severe diarrhea -replace along with Magnesium  A. fib - permanent -Mali Vasc 2 score 5 on Coreg -pradaxa on hold due to some blood-streaked stools  -patient was placed on heparin gtt by admitting MD  History of CAD denies any chest pain.  On Plavix statins Coreg.  Sleep apnea  -on CPAP.  Chronic combined systolic and diastolic heart failure last EF measured was 25 to 30% with  grade 3 diastolic dysfunction  - holding Lasix spironolactone and Entresto due to dehydration and weakness.  Acute renal failure likely from dehydration.   -Gently hydrating.   -Closely monitor respiratory status and patient has CHF.  Leukocytosis  -could be from diarrhea -Closely monitor.  obesity Body mass index is 38.77 kg/m.   Family Communication/Anticipated D/C date and plan/Code Status   DVT prophylaxis: heparin gtt Code Status: Full Code.  Family Communication:  Disposition Plan: pending GI work up   Medical Consultants:   GI consult    Subjective:   Weakness has improved overnight  Objective:    Vitals:   04/15/19 2130 04/15/19 2326 04/16/19 0551 04/16/19 0836  BP:  139/86 130/76 123/70  Pulse:  (!) 107 87 86  Resp:  18 19 18   Temp:  98.5 F (36.9 C)  98.4 F (36.9 C)  TempSrc:  Oral  Oral  SpO2: 96% 100% 95% 97%  Weight:      Height:        Intake/Output Summary (Last 24 hours) at 04/16/2019 1035 Last data filed at 04/16/2019 0900 Gross per 24 hour  Intake 1696.32 ml  Output 100 ml  Net 1596.32 ml   Filed Weights   04/15/19 1715  Weight: 115.7 kg    Exam: Sitting on side of bed irr +BS, soft, NT A+Ox3  Data Reviewed:   I have personally reviewed following labs and imaging studies:  Labs: Labs show the following:   Basic Metabolic Panel: Recent Labs  Lab 04/15/19 1402 04/15/19 1717 04/15/19 1736 04/16/19 0444  NA 138 136  --  139  K 2.7* 2.5*  --  2.6*  CL 99 98  --  105  CO2 27 25  --  22  GLUCOSE 169* 164*  --  174*  BUN 10 9  --  11  CREATININE 1.00 1.25*  --  1.09  CALCIUM 8.3* 8.4*  --  7.8*  MG  --   --  1.9 1.8   GFR Estimated Creatinine Clearance: 74.5 mL/min (by C-G formula based on SCr of 1.09 mg/dL). Liver Function Tests: Recent Labs  Lab 04/15/19 1717 04/16/19 0444  AST 16 11*  ALT 12 10  ALKPHOS 72 56  BILITOT 1.4* 1.1  PROT 6.2* 5.4*  ALBUMIN 3.4* 2.8*   Recent Labs  Lab 04/15/19 1717   LIPASE 20   No results for input(s): AMMONIA in the last 168 hours. Coagulation profile No results for input(s): INR, PROTIME in the last 168 hours.  CBC: Recent Labs  Lab 04/15/19 1402 04/15/19 1717 04/16/19 0444  WBC 20.0* 20.6* 17.1*  NEUTROABS 18.0*  --  14.7*  HGB 15.1 15.0 14.0  HCT 45.1 45.9 42.1  MCV 81.0 82.4 82.2  PLT 245.0 262 189   Cardiac Enzymes: No results for input(s): CKTOTAL, CKMB, CKMBINDEX, TROPONINI in the last 168 hours. BNP (last 3 results) Recent Labs    04/25/18 0756 05/09/18 1217 05/28/18 1123  PROBNP 1,112* 764* 646*   CBG: Recent Labs  Lab 04/15/19 2324 04/16/19 0832  GLUCAP 122* 180*   D-Dimer: No results for input(s): DDIMER in the last 72 hours. Hgb A1c: No results for input(s): HGBA1C in the last 72 hours. Lipid Profile: No results for input(s): CHOL, HDL, LDLCALC, TRIG, CHOLHDL, LDLDIRECT in the last 72 hours. Thyroid function studies: No results for input(s): TSH, T4TOTAL, T3FREE, THYROIDAB in the last 72 hours.  Invalid input(s): FREET3 Anemia work up: No results for input(s): VITAMINB12, FOLATE, FERRITIN, TIBC, IRON, RETICCTPCT in the last 72 hours. Sepsis Labs: Recent Labs  Lab 04/15/19 1402 04/15/19 1717 04/16/19 0444  WBC 20.0* 20.6* 17.1*    Microbiology Recent Results (from the past 240 hour(s))  SARS Coronavirus 2 (CEPHEID - Performed in [place] hospital lab), Hosp Order     Status: None   Collection Time: 04/15/19  7:46 PM   Specimen: Nasopharyngeal Swab  Result Value Ref Range Status   SARS Coronavirus 2 NEGATIVE NEGATIVE Final    Comment: (NOTE) If result is NEGATIVE SARS-CoV-2 target nucleic acids are NOT DETECTED. The SARS-CoV-2 RNA is generally detectable in upper and lower  respiratory specimens during the acute phase of infection. The lowest  concentration of SARS-CoV-2 viral copies this assay can detect is 250  copies / mL. A negative result does not preclude SARS-CoV-2 infection  and should  not be used as the sole basis for treatment or other  patient management decisions.  A negative result may occur with  improper specimen collection / handling, submission of specimen other  than nasopharyngeal swab, presence of viral mutation(s) within the  areas targeted by this assay, and inadequate number of viral copies  (<250 copies / mL). A negative result must be combined with clinical  observations, patient history, and epidemiological information. If result is POSITIVE SARS-CoV-2 target nucleic acids are DETECTED. The SARS-CoV-2 RNA is generally detectable in upper and lower  respiratory specimens dur ing the acute phase of infection.  Positive  results are indicative of active infection with SARS-CoV-2.  Clinical  correlation with patient history and other diagnostic information is  necessary to  determine patient infection status.  Positive results do  not rule out bacterial infection or co-infection with other viruses. If result is PRESUMPTIVE POSTIVE SARS-CoV-2 nucleic acids MAY BE PRESENT.   A presumptive positive result was obtained on the submitted specimen  and confirmed on repeat testing.  While 2019 novel coronavirus  (SARS-CoV-2) nucleic acids may be present in the submitted sample  additional confirmatory testing may be necessary for epidemiological  and / or clinical management purposes  to differentiate between  SARS-CoV-2 and other Sarbecovirus currently known to infect humans.  If clinically indicated additional testing with an alternate test  methodology [phone]) is advised. The SARS-CoV-2 RNA is generally  detectable in upper and lower respiratory sp ecimens during the acute  phase of infection. The expected result is Negative. Fact Sheet for Patients:  StrictlyIdeas.no Fact Sheet for Healthcare Providers: BankingDealers.co.za This test is not yet approved or cleared by the [place] FDA and has been  authorized for detection and/or diagnosis of SARS-CoV-2 by FDA under an Emergency Use Authorization (EUA).  This EUA will remain in effect (meaning this test can be used) for the duration of the COVID-19 declaration under Section 564(b)(1) of the Act, 21 U.S.C. section 360bbb-3(b)(1), unless the authorization is terminated or revoked sooner. Performed at [facility], [place] [address], [place] [address], [place] [address]     Procedures and diagnostic studies:  No results found.  Medications:   . atorvastatin  20 mg Oral Q breakfast  . carvedilol  25 mg Oral BID WC  . clopidogrel  75 mg Oral Q breakfast  . insulin aspart  0-9 Units Subcutaneous TID WC  . isosorbide mononitrate  60 mg Oral Q breakfast  . levothyroxine  50 mcg Oral QAC breakfast  . mesalamine  2.4 g Oral Q breakfast  . omega-3 acid ethyl esters  1 g Oral Q supper   Continuous Infusions: . sodium chloride 75 mL/hr at 04/16/19 0007  . heparin 1,500 Units/hr (04/16/19 0530)  . magnesium sulfate bolus IVPB       LOS: 0 days   Geradine Girt  [facility]   How to contact the [facility] Attending or Consulting provider [place] or covering provider during after hours [place], for this patient?  1. Check the care team in [facility] and look for a) attending/consulting [facility] provider listed and b) the [facility] team listed 2. Log into [email or web address] and use [place]'s universal password to access. If you do not have the password, please contact the hospital operator. 3. Locate the [facility] provider you are looking for under [facility] and page to a number that you can be directly reached. 4. If you still have difficulty reaching the provider, please page the [facility] (Director on Call) for the Hospitalists listed on amion for assistance.  04/16/2019, 10:35 AM

## 2019-04-16 NOTE — Progress Notes (Signed)
Spoke with [place] daughter Lenna Sciara, would like a phone call tomorrow from the Physician.  Her phone number is under contacts.

## 2019-04-16 NOTE — Progress Notes (Signed)
ANTICOAGULATION CONSULT NOTE Pharmacy Consult for Heparin Indication: atrial fibrillation  No Known Allergies  Patient Measurements: Height: 5' 8"  (172.7 cm) Weight: 255 lb (115.7 kg) IBW/kg (Calculated) : 68.4 Heparin Dosing Weight: 95 kg  Vital Signs: Temp: 98.4 F (36.9 C) (07/07 0836) Temp Source: Oral (07/07 0836) BP: 123/70 (07/07 0836) Pulse Rate: 86 (07/07 0836)  Labs: Recent Labs    04/15/19 1402 04/15/19 1717 04/16/19 0444 04/16/19 1346  HGB 15.1 15.0 14.0  --   HCT 45.1 45.9 42.1  --   PLT 245.0 262 189  --   HEPARINUNFRC  --   --   --  0.30  CREATININE 1.00 1.25* 1.09  --     Estimated Creatinine Clearance: 74.5 mL/min (by C-G formula based on SCr of 1.09 mg/dL).   Medical History: Past Medical History:  Diagnosis Date  . Atrial fibrillation ([place])    initial diagnoses 2012  . B12 deficiency    per patient previously taking shots  . CORONARY ARTERY DISEASE 04/12/2007   2 stents last in 2006.   Marland Kitchen DIABETES MELLITUS, TYPE II 04/16/2007  . Diverticulosis of colon (without mention of hemorrhage)   . HYPERLIPIDEMIA 04/16/2007   reveal study. not sure of medication  . HYPERTENSION 04/12/2007  . HYPOTHYROIDISM 04/12/2007  . Ischemic cardiomyopathy    cath 35-40%  . MYOCARDIAL INFARCTION, HX OF 04/12/2007  . OBESITY 06/16/2009  . SLEEP APNEA 04/12/2007   CPAP  . Stroke ([place])   . ULCERATIVE COLITIS, LEFT SIDED 11/23/2010    Medications:  Medications Prior to Admission  Medication Sig Dispense Refill Last Dose  . acetaminophen (TYLENOL) 500 MG tablet Take 1,000 mg by mouth every 6 (six) hours as needed for headache (pain).   month ago  . atorvastatin (LIPITOR) 20 MG tablet TAKE ONE TABLET (20MG TOTAL) BY MOUTH DAILY (Patient taking differently: Take 20 mg by mouth daily with breakfast. ) 90 tablet 1 04/14/2019 at am  . carvedilol (COREG) 25 MG tablet TAKE ONE TABLET BY MOUTH TWICE A DAY (Patient taking differently: Take 25 mg by mouth 2 (two) times daily with a meal. )  180 tablet 2 04/14/2019 at 1600  . clopidogrel (PLAVIX) 75 MG tablet Take 1 tablet (75 mg total) by mouth daily. (Patient taking differently: Take 75 mg by mouth daily with breakfast. ) 90 tablet 3 04/14/2019 at am  . Coenzyme Q10 (COQ10 PO) Take 1 capsule by mouth daily with supper.   04/14/2019 at pm  . empagliflozin (JARDIANCE) 10 MG TABS tablet TAKE ONE (1) TABLET BY MOUTH EVERY DAY (Patient taking differently: Take 10 mg by mouth daily with breakfast. ) 90 tablet 3 04/14/2019 at am  . fish oil-omega-3 fatty acids 1000 MG capsule Take 1 g by mouth daily with supper.    04/14/2019 at pm  . furosemide (LASIX) 40 MG tablet Take 1 tablet (40 mg total) by mouth daily. Take extra tablet once daily in the PM as needed for weight gain 3 lbs in 24 hrs or swelling. (Patient taking differently: Take 40 mg by mouth See admin instructions. Take one tablet (40 mg) by mouth daily with breakfast, may take an extra tablet once daily in the evening as needed for weight gain 3 lbs in 24 hrs or swelling.) 30 tablet 11 04/14/2019 at am  . glipiZIDE (GLUCOTROL) 10 MG tablet TAKE ONE TABLET BY MOUTH TWICE A DAY (Patient taking differently: Take 10 mg by mouth 2 (two) times daily before a meal. ) 180 tablet  2 04/14/2019 at pm  . glucosamine-chondroitin 500-400 MG tablet Take 1 tablet by mouth daily as needed (knee flare up or pain).   few days ago  . isosorbide mononitrate (IMDUR) 60 MG 24 hr tablet TAKE ONE (1) TABLET BY MOUTH EVERY DAY (Patient taking differently: Take 60 mg by mouth daily with breakfast. ) 90 tablet 3 04/14/2019 at am  . mesalamine (APRISO) 0.375 g 24 hr capsule Take 4 capsules (1.5 g total) by mouth daily. (Patient taking differently: Take 1.5 g by mouth daily with breakfast. ) 120 capsule 11 04/14/2019 at am  . metFORMIN (GLUCOPHAGE) 1000 MG tablet TAKE ONE TABLET BY MOUTH TWICE A DAY WITH A MEAL (Patient taking differently: Take 1,000 mg by mouth 2 (two) times daily with a meal. ) 180 tablet 1 few days ago  . PRADAXA  150 MG CAPS capsule TAKE ONE (1) CAPSULE BY MOUTH TWICE A DAY. (EVERY 12 HOURS.) (EVERY 12 HOURS) (Patient taking differently: Take 150 mg by mouth 2 (two) times daily with a meal. ) 60 capsule 6 04/14/2019 at 1600  . sacubitril-valsartan (ENTRESTO) 97-103 MG Take 1 tablet by mouth 2 (two) times daily. (Patient taking differently: Take 1 tablet by mouth 2 (two) times daily with a meal. ) 60 tablet 6 04/14/2019 at pm  . sitaGLIPtin (JANUVIA) 100 MG tablet TAKE ONE (1) TABLET BY MOUTH EVERY DAY (Patient taking differently: Take 100 mg by mouth daily with breakfast. ) 90 tablet 2 04/14/2019 at am  . spironolactone (ALDACTONE) 25 MG tablet TAKE ONE TABLET (25MG TOTAL) BY MOUTH DAILY (Patient taking differently: Take 25 mg by mouth daily with breakfast. ) 90 tablet 1 04/14/2019 at am  . SYNTHROID 50 MCG tablet TAKE 1 TABLET (50 MG TOTAL) BY MOUTH DAILY BEFORE BREAKFAST (Patient taking differently: Take 50 mcg by mouth daily before breakfast. ) 90 tablet 3 04/14/2019 at am  . glucose blood test strip Use as instructed to test 4 time daily 400 each 3     Assessment: 73 y.o. male with h/o Afib, Pradaxa on hold, for heparin  Heparin level therapeutic  Goal of Therapy:  Heparin level 0.3-0.7 units/ml Monitor platelets by anticoagulation protocol: Yes   Plan:  Continue heparin at 1500 units / hr Daily labs  Tad Moore 04/16/2019,2:42 PM

## 2019-04-17 ENCOUNTER — Other Ambulatory Visit: Payer: Self-pay | Admitting: Gastroenterology

## 2019-04-17 DIAGNOSIS — K5100 Ulcerative (chronic) pancolitis without complications: Secondary | ICD-10-CM

## 2019-04-17 DIAGNOSIS — I5042 Chronic combined systolic (congestive) and diastolic (congestive) heart failure: Secondary | ICD-10-CM

## 2019-04-17 DIAGNOSIS — I4821 Permanent atrial fibrillation: Secondary | ICD-10-CM

## 2019-04-17 DIAGNOSIS — E039 Hypothyroidism, unspecified: Secondary | ICD-10-CM

## 2019-04-17 DIAGNOSIS — A0472 Enterocolitis due to Clostridium difficile, not specified as recurrent: Principal | ICD-10-CM

## 2019-04-17 DIAGNOSIS — G4733 Obstructive sleep apnea (adult) (pediatric): Secondary | ICD-10-CM

## 2019-04-17 DIAGNOSIS — I10 Essential (primary) hypertension: Secondary | ICD-10-CM

## 2019-04-17 DIAGNOSIS — I2510 Atherosclerotic heart disease of native coronary artery without angina pectoris: Secondary | ICD-10-CM

## 2019-04-17 HISTORY — DX: Enterocolitis due to Clostridium difficile, not specified as recurrent: A04.72

## 2019-04-17 LAB — BASIC METABOLIC PANEL
Anion gap: 9 (ref 5–15)
BUN: 12 mg/dL (ref 8–23)
CO2: 23 mmol/L (ref 22–32)
Calcium: 8 mg/dL — ABNORMAL LOW (ref 8.9–10.3)
Chloride: 106 mmol/L (ref 98–111)
Creatinine, Ser: 0.97 mg/dL (ref 0.61–1.24)
GFR calc Af Amer: >60 mL/min (ref >60)
GFR calc non Af Amer: >60 mL/min (ref >60)
Glucose, Bld: 170 mg/dL — ABNORMAL HIGH (ref 70–99)
Potassium: 3.8 mmol/L (ref 3.5–5.1)
Sodium: 138 mmol/L (ref 135–145)

## 2019-04-17 LAB — GLUCOSE, CAPILLARY
Glucose-Capillary: 141 mg/dL — ABNORMAL HIGH (ref 70–99)
Glucose-Capillary: 178 mg/dL — ABNORMAL HIGH (ref 70–99)
Glucose-Capillary: 143 mg/dL — ABNORMAL HIGH (ref 70–99)
Glucose-Capillary: 136 mg/dL — ABNORMAL HIGH (ref 70–99)

## 2019-04-17 LAB — CBC
HCT: 42.9 % (ref 39.0–52.0)
Hemoglobin: 14 g/dL (ref 13.0–17.0)
MCH: 27.1 pg (ref 26.0–34.0)
MCHC: 32.6 g/dL (ref 30.0–36.0)
MCV: 83 fL (ref 80.0–100.0)
Platelets: 202 10*3/uL (ref 150–400)
RBC: 5.17 MIL/uL (ref 4.22–5.81)
RDW: 15 % (ref 11.5–15.5)
WBC: 11.3 10*3/uL — ABNORMAL HIGH (ref 4.0–10.5)
nRBC: 0 % (ref 0.0–0.2)

## 2019-04-17 LAB — HEPARIN LEVEL (UNFRACTIONATED): Heparin Unfractionated: 0.28 IU/mL — ABNORMAL LOW (ref 0.30–0.70)

## 2019-04-17 LAB — MAGNESIUM: Magnesium: 2.3 mg/dL (ref 1.7–2.4)

## 2019-04-17 MED ORDER — CLOPIDOGREL BISULFATE 75 MG PO TABS
75.0000 mg | ORAL_TABLET | Freq: Every day | ORAL | Status: DC
  Administered 2019-04-17 – 2019-04-18 (×2): 75 mg via ORAL
  Filled 2019-04-17 (×2): qty 1

## 2019-04-17 MED ORDER — DABIGATRAN ETEXILATE MESYLATE 150 MG PO CAPS
150.0000 mg | ORAL_CAPSULE | Freq: Two times a day (BID) | ORAL | Status: DC
  Administered 2019-04-17 – 2019-04-18 (×3): 150 mg via ORAL
  Filled 2019-04-17 (×4): qty 1

## 2019-04-17 NOTE — Plan of Care (Signed)
°  Problem: Coping: °Goal: Level of anxiety will decrease °Outcome: Progressing °  °

## 2019-04-17 NOTE — Progress Notes (Addendum)
[facility] Progress Note    Since last GI note: C. Diff was positive last night and vancomycin was started. He tells me he can already tell a difference (less urgency).  He's seen no blood in his loose stools in 48 hours (since before vanc actually)  Objective: Vital signs in last 24 hours: Temp:  [98.2 F (36.8 C)-98.6 F (37 C)] 98.6 F (37 C) (07/08 0556) Pulse Rate:  [74-85] 74 (07/08 0556) Resp:  [18] 18 (07/08 0556) BP: (101-126)/(56-84) 126/84 (07/08 0556) SpO2:  [96 %-98 %] 98 % (07/08 0556) Last BM Date: 04/16/19 General: alert and oriented times 3 Heart: regular rate and rythm Abdomen: soft, non-tender, non-distended, normal bowel sounds   Lab Results: Recent Labs    04/15/19 1717 04/16/19 0444 04/17/19 0532  WBC 20.6* 17.1* 11.3*  HGB 15.0 14.0 14.0  PLT 262 189 202  MCV 82.4 82.2 83.0   Recent Labs    04/15/19 1402 04/15/19 1717 04/16/19 0444  NA 138 136 139  K 2.7* 2.5* 2.6*  CL 99 98 105  CO2 27 25 22   GLUCOSE 169* 164* 174*  BUN 10 9 11   CREATININE 1.00 1.25* 1.09  CALCIUM 8.3* 8.4* 7.8*   Recent Labs    04/15/19 1717 04/16/19 0444  PROT 6.2* 5.4*  ALBUMIN 3.4* 2.8*  AST 16 11*  ALT 12 10  ALKPHOS 72 56  BILITOT 1.4* 1.1     Medications: Scheduled Meds: . atorvastatin  20 mg Oral Q breakfast  . carvedilol  25 mg Oral BID WC  . insulin aspart  0-9 Units Subcutaneous TID WC  . isosorbide mononitrate  60 mg Oral Q breakfast  . levothyroxine  50 mcg Oral QAC breakfast  . mesalamine  2.4 g Oral Q breakfast  . omega-3 acid ethyl esters  1 g Oral Q supper  . vancomycin  125 mg Oral QID   Continuous Infusions: . 0.9 % NaCl with KCl 20 mEq / L 75 mL/hr at 04/17/19 0225  . heparin 1,500 Units/hr (04/16/19 2228)   PRN Meds:.acetaminophen **OR** acetaminophen    Assessment/Plan:  73 y.o. male C. Diff colitis in setting of UC  He will need to complete 2 weeks of vancomycin 150m QID.  No plans for colonoscopy given  the C. Diff + result.  Ok to resume his blood thinner. Need to replete his potassium prior to d/c and then it is probably safest for him to take 157m daily potassium until his bowels are back to his normal (3-4 formed stools daily).  He should continue his usual UC meds (Apriso 4 pills once daily) for now.  [facility] will arrange follow up in 4-5 weeks with Dr. StFuller Plan Please call or page with any further questions or concerns.   DaMilus BanisterMD  04/17/2019, 9:11 AM [facility] Pager [phone]

## 2019-04-17 NOTE — Progress Notes (Signed)
PROGRESS NOTE    [name]  DZH:[id number] DOB: 04-06-1946 DOA: 04/15/2019 PCP: Marin Olp, MD    Brief Narrative:  HPI per Dr. Gardiner Coins is a 73 y.o. male with history of ulcerative colitis, atrial fibrillation, CAD status post stenting, chronic combined systolic and diastolic dysfunction, sleep apnea, hypothyroidism, diabetes mellitus presents to the ER because of increasing weakness over the last 4 days.  Patient's weakness started along with patient having multiple episodes of diarrhea.  Some of the diarrhea was blood-streaked.  Denies any abdominal pain nausea or vomiting but has been having poor appetite.  Denies chest pain or shortness of breath.  Patient states he had taken antibiotics a month ago for tooth infection which was for 2 weeks.  ED Course: In the ER patient appearred generally weak but nonfocal.  Labs reveal potassium 2.5 creatinine increased to 1.2 lipase 20 LFTs were largely unremarkable CBC was showing leukocytosis of 20,000 hemoglobin 15 platelets 245.  While in the ER patient had another bowel movement which was sent for stool studies.  Patient was started on gentle hydration and admitted for weakness likely from severe diarrhea and further work-up for diarrhea.  EKG shows A. fib with slightly elevated heart rate  Assessment & Plan:   Principal Problem:   C. difficile colitis Active Problems:   Hypothyroidism   Type II diabetes mellitus with peripheral circulatory disorder (HCC)   Hyperlipidemia   Essential hypertension   CAD (coronary artery disease), native coronary artery   OSA (obstructive sleep apnea)   Atrial fibrillation (HCC)   Ulcerative pancolitis without complication (HCC)   Morbid obesity (HCC)   Dehydration   Diarrhea   ARF (acute renal failure) (HCC)   Chronic combined systolic and diastolic CHF (congestive heart failure) (HCC)   Hypokalemia  1 C. difficile colitis Patient presented with severe diarrhea resulting  in significant hypokalemia.  Concern for antibiotic induced diarrhea with recent use of antibiotics was initial concern versus ulcerative colitis flare.  C. difficile PCR was obtained which came back positive for antigen, toxin negative, toxic allergenic C. difficile positive.  Patient had presented with multiple loose stools, significant leukocytosis with a white count of 20,000, generalized weakness, significant hypokalemia.  GI consulted due to concern for ulcerative colitis flare.  Patient seen in consultation by GI who recommended patient needed to complete a two-week course of vancomycin 125 mg 4 times daily.  Due to positive CD result plans for colonoscopy have been discontinued.  GI recommending 10 mEq of potassium until bowels are back to normal of 3-4 formed stools per day.  Outpatient follow-up with GI.  2.  Significant hypokalemia Secondary to problem #1 secondary to GI losses.  Potassium on admission was 2.7 and went as low as 2.5.  Patient placed on IV fluids with potassium supplementation.  Hypokalemia improved.  Magnesium at 2.3.  Follow.  3.  Acute kidney injury Felt secondary to a prerenal azotemia secondary to significant GI losses.  Improved with hydration.  Follow.  4.  Generalized weakness Secondary to problem #1.  Improving slowly.  Monitor closely with gentle hydration.  Follow.  5.  Permanent atrial fibrillation (cha2ds2vasc SCORE 5) Continue Coreg for rate control.  Pradaxa was held due to some blood streaking stools and patient placed on heparin drip.  Will discontinue heparin drip and resume Pradaxa per GI recommendations.  Follow.  6.  Obstructive sleep apnea CPAP nightly.  7.  Chronic combined systolic and diastolic heart failure Last EF  25 to 30% with grade 3 diastolic dysfunction.  Lasix, spironolactone, Entresto on hold due to dehydration and weakness.  Monitor closely for volume overload.  Likely resume diuretics as #1 improves.  Follow.  8.  History of coronary  artery disease Stable.  Continue Coreg and Imdur, statin.  Resume Plavix.  Follow.  9.  Obesity  10 history of ulcerative colitis Continue current regimen of her mesalamine.  Per GI.  11.  Hypothyroidism Continue home dose Synthroid.  Outpatient follow-up.    DVT prophylaxis: Heparin Code Status: Full Family Communication: Updated patient.  No family at bedside. Disposition Plan: Likely home once stool consistency improves and stool frequency decreases with replacement of electrolytes hopefully in the next 24 to 48 hours.   Consultants:   Gastroenterology: Dr. Ardis Hughs 04/16/2019  Procedures:   None  Antimicrobials:   Oral vancomycin 04/16/2019>>>>> 04/26/2019   Subjective: Sitting on bedside commode. States consistency of stool improving.  Denies any chest pain.  No shortness of breath.  Feeling better since start of patient's oral vancomycin.  Objective: Vitals:   04/16/19 1624 04/16/19 2126 04/17/19 0556 04/17/19 1002  BP: 108/68 (!) 101/56 126/84 95/68  Pulse: 85 80 74 85  Resp: 18 18 18 18   Temp: 98.4 F (36.9 C) 98.2 F (36.8 C) 98.6 F (37 C) 97.7 F (36.5 C)  TempSrc: Oral Oral  Oral  SpO2: 97% 96% 98% 97%  Weight:      Height:        Intake/Output Summary (Last 24 hours) at 04/17/2019 1040 Last data filed at 04/17/2019 0700 Gross per 24 hour  Intake 1809.66 ml  Output 300 ml  Net 1509.66 ml   Filed Weights   04/15/19 1715  Weight: 115.7 kg    Examination:  General exam: Appears calm and comfortable  Respiratory system: Clear to auscultation. Respiratory effort normal. Cardiovascular system: S1 & S2 heard, RRR. No JVD, murmurs, rubs, gallops or clicks. No pedal edema. Gastrointestinal system: Abdomen is mildly distended, soft and nontender. No organomegaly or masses felt. Normal bowel sounds heard. Central nervous system: Alert and oriented. No focal neurological deficits. Extremities: Symmetric 5 x 5 power. Skin: No rashes, lesions or ulcers  Psychiatry: Judgement and insight appear normal. Mood & affect appropriate.     Data Reviewed: I have personally reviewed following labs and imaging studies  CBC: Recent Labs  Lab 04/15/19 1402 04/15/19 1717 04/16/19 0444 04/17/19 0532  WBC 20.0* 20.6* 17.1* 11.3*  NEUTROABS 18.0*  --  14.7*  --   HGB 15.1 15.0 14.0 14.0  HCT 45.1 45.9 42.1 42.9  MCV 81.0 82.4 82.2 83.0  PLT 245.0 262 189 675   Basic Metabolic Panel: Recent Labs  Lab 04/15/19 1402 04/15/19 1717 04/15/19 1736 04/16/19 0444  NA 138 136  --  139  K 2.7* 2.5*  --  2.6*  CL 99 98  --  105  CO2 27 25  --  22  GLUCOSE 169* 164*  --  174*  BUN 10 9  --  11  CREATININE 1.00 1.25*  --  1.09  CALCIUM 8.3* 8.4*  --  7.8*  MG  --   --  1.9 1.8   GFR: Estimated Creatinine Clearance: 74.5 mL/min (by C-G formula based on SCr of 1.09 mg/dL). Liver Function Tests: Recent Labs  Lab 04/15/19 1717 04/16/19 0444  AST 16 11*  ALT 12 10  ALKPHOS 72 56  BILITOT 1.4* 1.1  PROT 6.2* 5.4*  ALBUMIN 3.4* 2.8*  Recent Labs  Lab 04/15/19 1717  LIPASE 20   No results for input(s): AMMONIA in the last 168 hours. Coagulation Profile: No results for input(s): INR, PROTIME in the last 168 hours. Cardiac Enzymes: No results for input(s): CKTOTAL, CKMB, CKMBINDEX, TROPONINI in the last 168 hours. BNP (last 3 results) Recent Labs    04/25/18 0756 05/09/18 1217 05/28/18 1123  PROBNP 1,112* 764* 646*   HbA1C: No results for input(s): HGBA1C in the last 72 hours. CBG: Recent Labs  Lab 04/16/19 0832 04/16/19 1143 04/16/19 1621 04/16/19 2122 04/17/19 0649  GLUCAP 180* 127* 134* 140* 141*   Lipid Profile: No results for input(s): CHOL, HDL, LDLCALC, TRIG, CHOLHDL, LDLDIRECT in the last 72 hours. Thyroid Function Tests: No results for input(s): TSH, T4TOTAL, FREET4, T3FREE, THYROIDAB in the last 72 hours. Anemia Panel: No results for input(s): VITAMINB12, FOLATE, FERRITIN, TIBC, IRON, RETICCTPCT in the last  72 hours. Sepsis Labs: No results for input(s): PROCALCITON, LATICACIDVEN in the last 168 hours.  Recent Results (from the past 240 hour(s))  SARS Coronavirus 2 (CEPHEID - Performed in [facility] hospital lab), Hosp Order     Status: None   Collection Time: 04/15/19  7:46 PM   Specimen: Nasopharyngeal Swab  Result Value Ref Range Status   SARS Coronavirus 2 NEGATIVE NEGATIVE Final    Comment: (NOTE) If result is NEGATIVE SARS-CoV-2 target nucleic acids are NOT DETECTED. The SARS-CoV-2 RNA is generally detectable in upper and lower  respiratory specimens during the acute phase of infection. The lowest  concentration of SARS-CoV-2 viral copies this assay can detect is 250  copies / mL. A negative result does not preclude SARS-CoV-2 infection  and should not be used as the sole basis for treatment or other  patient management decisions.  A negative result may occur with  improper specimen collection / handling, submission of specimen other  than nasopharyngeal swab, presence of viral mutation(s) within the  areas targeted by this assay, and inadequate number of viral copies  (<250 copies / mL). A negative result must be combined with clinical  observations, patient history, and epidemiological information. If result is POSITIVE SARS-CoV-2 target nucleic acids are DETECTED. The SARS-CoV-2 RNA is generally detectable in upper and lower  respiratory specimens dur ing the acute phase of infection.  Positive  results are indicative of active infection with SARS-CoV-2.  Clinical  correlation with patient history and other diagnostic information is  necessary to determine patient infection status.  Positive results do  not rule out bacterial infection or co-infection with other viruses. If result is PRESUMPTIVE POSTIVE SARS-CoV-2 nucleic acids MAY BE PRESENT.   A presumptive positive result was obtained on the submitted specimen  and confirmed on repeat testing.  While 2019 novel  coronavirus  (SARS-CoV-2) nucleic acids may be present in the submitted sample  additional confirmatory testing may be necessary for epidemiological  and / or clinical management purposes  to differentiate between  SARS-CoV-2 and other Sarbecovirus currently known to infect humans.  If clinically indicated additional testing with an alternate test  methodology [phone]) is advised. The SARS-CoV-2 RNA is generally  detectable in upper and lower respiratory sp ecimens during the acute  phase of infection. The expected result is Negative. Fact Sheet for Patients:  StrictlyIdeas.no Fact Sheet for Healthcare Providers: BankingDealers.co.za This test is not yet approved or cleared by the [place] FDA and has been authorized for detection and/or diagnosis of SARS-CoV-2 by FDA under an Emergency Use Authorization (EUA).  This EUA will remain in effect (meaning this test can be used) for the duration of the COVID-19 declaration under Section 564(b)(1) of the Act, 21 U.S.C. section 360bbb-3(b)(1), unless the authorization is terminated or revoked sooner. Performed at [facility], [place] [address] [place], [place] [address]   Gastrointestinal Panel by PCR , Stool     Status: None   Collection Time: 04/15/19  9:07 PM   Specimen: Stool  Result Value Ref Range Status   Campylobacter species NOT DETECTED NOT DETECTED Final   Plesimonas shigelloides NOT DETECTED NOT DETECTED Final   Salmonella species NOT DETECTED NOT DETECTED Final   Yersinia enterocolitica NOT DETECTED NOT DETECTED Final   Vibrio species NOT DETECTED NOT DETECTED Final   Vibrio cholerae NOT DETECTED NOT DETECTED Final   Enteroaggregative E coli (EAEC) NOT DETECTED NOT DETECTED Final   Enteropathogenic E coli (EPEC) NOT DETECTED NOT DETECTED Final   Enterotoxigenic E coli (ETEC) NOT DETECTED NOT DETECTED Final   Shiga like toxin producing E coli (STEC) NOT DETECTED NOT  DETECTED Final   Shigella/Enteroinvasive E coli (EIEC) NOT DETECTED NOT DETECTED Final   Cryptosporidium NOT DETECTED NOT DETECTED Final   Cyclospora cayetanensis NOT DETECTED NOT DETECTED Final   Entamoeba histolytica NOT DETECTED NOT DETECTED Final   Giardia lamblia NOT DETECTED NOT DETECTED Final   Adenovirus F40/41 NOT DETECTED NOT DETECTED Final   Astrovirus NOT DETECTED NOT DETECTED Final   Norovirus GI/GII NOT DETECTED NOT DETECTED Final   Rotavirus A NOT DETECTED NOT DETECTED Final   Sapovirus (I, II, IV, and V) NOT DETECTED NOT DETECTED Final    Comment: Performed at [facility], [address] [place], [place] [address]  C difficile quick scan w PCR reflex     Status: Abnormal   Collection Time: 04/16/19  2:51 PM   Specimen: STOOL  Result Value Ref Range Status   C Diff antigen POSITIVE (A) NEGATIVE Final   C Diff toxin NEGATIVE NEGATIVE Final   C Diff interpretation Results are indeterminate. See PCR results.  Final    Comment: Performed at [facility], [place] [address], [place], [place] [address]  C. Diff by PCR, Reflexed     Status: Abnormal   Collection Time: 04/16/19  2:51 PM  Result Value Ref Range Status   Toxigenic C. Difficile by PCR POSITIVE (A) NEGATIVE Final    Comment: Positive for toxigenic C. difficile with little to no toxin production. Only treat if clinical presentation suggests symptomatic illness. Performed at [facility], [place] [address], [place], [place] [address]          Radiology Studies: No results found.      Scheduled Meds: . atorvastatin  20 mg Oral Q breakfast  . carvedilol  25 mg Oral BID WC  . insulin aspart  0-9 Units Subcutaneous TID WC  . isosorbide mononitrate  60 mg Oral Q breakfast  . levothyroxine  50 mcg Oral QAC breakfast  . mesalamine  2.4 g Oral Q breakfast  . omega-3 acid ethyl esters  1 g Oral Q supper  . vancomycin  125 mg Oral QID   Continuous Infusions: . 0.9 % NaCl with KCl 20 mEq / L  75 mL/hr at 04/17/19 0225  . heparin 1,650 Units/hr (04/17/19 0925)     LOS: 1 day    Time spent: 40 minutes    Irine Seal, MD [facility]  If 7PM-7AM, please contact night-coverage [email or web address] 04/17/2019, 10:40  AM

## 2019-04-17 NOTE — Progress Notes (Signed)
Pt refused CPAP said he dont want to wear and dont need anyone to ask him.Marland Kitchen

## 2019-04-17 NOTE — Progress Notes (Signed)
ANTICOAGULATION CONSULT NOTE Pharmacy Consult for Heparin Indication: atrial fibrillation  No Known Allergies  Patient Measurements: Height: 5' 8"  (172.7 cm) Weight: 255 lb (115.7 kg) IBW/kg (Calculated) : 68.4 Heparin Dosing Weight: 95 kg  Vital Signs: Temp: 98.6 F (37 C) (07/08 0556) Temp Source: Oral (07/07 2126) BP: 126/84 (07/08 0556) Pulse Rate: 74 (07/08 0556)  Labs: Recent Labs    04/15/19 1402 04/15/19 1717 04/16/19 0444 04/16/19 1346 04/17/19 0532  HGB 15.1 15.0 14.0  --  14.0  HCT 45.1 45.9 42.1  --  42.9  PLT 245.0 262 189  --  202  HEPARINUNFRC  --   --   --  0.30 0.28*  CREATININE 1.00 1.25* 1.09  --   --     Estimated Creatinine Clearance: 74.5 mL/min (by C-G formula based on SCr of 1.09 mg/dL).   Assessment: 74 y.o. male with h/o Afib, Pradaxa on hold, continues on heparin awaiting GI studies Cdiff positive - > on po vancomycin  Heparin level 0.28  Goal of Therapy:  Heparin level 0.3-0.7 units/ml Monitor platelets by anticoagulation protocol: Yes   Plan:  Increase heparin to 1650 units / hr Daily heparin level, CBC  Thank you Anette Guarneri, PharmD [phone]  04/17/2019,8:39 AM

## 2019-04-18 LAB — GLUCOSE, CAPILLARY
Glucose-Capillary: 130 mg/dL — ABNORMAL HIGH (ref 70–99)
Glucose-Capillary: 164 mg/dL — ABNORMAL HIGH (ref 70–99)

## 2019-04-18 LAB — CBC WITH DIFFERENTIAL/PLATELET
Abs Immature Granulocytes: 0.05 10*3/uL (ref 0.00–0.07)
Basophils Absolute: 0 10*3/uL (ref 0.0–0.1)
Basophils Relative: 0 %
Eosinophils Absolute: 0.2 10*3/uL (ref 0.0–0.5)
Eosinophils Relative: 2 %
HCT: 38.5 % — ABNORMAL LOW (ref 39.0–52.0)
Hemoglobin: 12.6 g/dL — ABNORMAL LOW (ref 13.0–17.0)
Immature Granulocytes: 1 %
Lymphocytes Relative: 11 %
Lymphs Abs: 1.1 10*3/uL (ref 0.7–4.0)
MCH: 27.2 pg (ref 26.0–34.0)
MCHC: 32.7 g/dL (ref 30.0–36.0)
MCV: 83.2 fL (ref 80.0–100.0)
Monocytes Absolute: 0.6 10*3/uL (ref 0.1–1.0)
Monocytes Relative: 6 %
Neutro Abs: 7.9 10*3/uL — ABNORMAL HIGH (ref 1.7–7.7)
Neutrophils Relative %: 80 %
Platelets: 206 10*3/uL (ref 150–400)
RBC: 4.63 MIL/uL (ref 4.22–5.81)
RDW: 15.1 % (ref 11.5–15.5)
WBC: 9.8 10*3/uL (ref 4.0–10.5)
nRBC: 0 % (ref 0.0–0.2)

## 2019-04-18 LAB — BASIC METABOLIC PANEL
Anion gap: 10 (ref 5–15)
BUN: 8 mg/dL (ref 8–23)
CO2: 22 mmol/L (ref 22–32)
Calcium: 8 mg/dL — ABNORMAL LOW (ref 8.9–10.3)
Chloride: 108 mmol/L (ref 98–111)
Creatinine, Ser: 0.86 mg/dL (ref 0.61–1.24)
GFR calc Af Amer: >60 mL/min (ref >60)
GFR calc non Af Amer: >60 mL/min (ref >60)
Glucose, Bld: 134 mg/dL — ABNORMAL HIGH (ref 70–99)
Potassium: 3.4 mmol/L — ABNORMAL LOW (ref 3.5–5.1)
Sodium: 140 mmol/L (ref 135–145)

## 2019-04-18 LAB — MAGNESIUM: Magnesium: 2.1 mg/dL (ref 1.7–2.4)

## 2019-04-18 MED ORDER — POTASSIUM CHLORIDE ER 10 MEQ PO TBCR: 10.0000 meq | EXTENDED_RELEASE_TABLET | Freq: Every day | ORAL | 0 refills | Status: DC

## 2019-04-18 MED ORDER — POTASSIUM CHLORIDE CRYS ER 20 MEQ PO TBCR
40.0000 meq | EXTENDED_RELEASE_TABLET | Freq: Once | ORAL | Status: AC
  Administered 2019-04-18: 40 meq via ORAL
  Filled 2019-04-18: qty 2

## 2019-04-18 MED ORDER — VANCOMYCIN HCL 125 MG PO CAPS: 125.0000 mg | ORAL_CAPSULE | Freq: Four times a day (QID) | ORAL | 0 refills | Status: DC

## 2019-04-18 MED FILL — VANCOMYCIN HCL 125 MG CAP: 125 | 13 days supply | Qty: 52 | Fill #0

## 2019-04-18 NOTE — Discharge Summary (Signed)
Physician Discharge Summary  [name] LKT:[id number] DOB: 1946/08/05 DOA: 04/15/2019  PCP: Marin Olp, MD  Admit date: 04/15/2019 Discharge date: 04/18/2019  Time spent: 60 minutes  Recommendations for Outpatient Follow-up:  1. Follow-up with Marin Olp, MD in 1 to 2 weeks.  On follow-up patient will need a basic metabolic profile, magnesium done to follow-up on electrolytes and renal function. 2. Follow-up with Dr. Fuller Plan, gastroenterology in 4 weeks.  Office will call with an appointment time.   Discharge Diagnoses:  Principal Problem:   C. difficile colitis Active Problems:   Hypothyroidism   Type II diabetes mellitus with peripheral circulatory disorder (HCC)   Hyperlipidemia   Essential hypertension   CAD (coronary artery disease), native coronary artery   OSA (obstructive sleep apnea)   Atrial fibrillation (HCC)   Ulcerative pancolitis without complication (HCC)   Morbid obesity (HCC)   Dehydration   Diarrhea   ARF (acute renal failure) (HCC)   Chronic combined systolic and diastolic CHF (congestive heart failure) (HCC)   Hypokalemia   Discharge Condition: Stable and improved  Diet recommendation: Heart healthy  Filed Weights   04/15/19 1715 04/17/19 2148 04/18/19 0315  Weight: 115.7 kg 120.4 kg 120.4 kg    History of present illness:  HPI per Dr. Gardiner Coins is a 73 y.o. male with history of ulcerative colitis, atrial fibrillation, CAD status post stenting, chronic combined systolic and diastolic dysfunction, sleep apnea, hypothyroidism, diabetes mellitus presented to the ER because of increasing weakness over the last 4 days.  Patient's weakness started along with patient having multiple episodes of diarrhea.  Some of the diarrhea was blood-streaked.  Denied any abdominal pain nausea or vomiting but has been having poor appetite.  Denies chest pain or shortness of breath.  Patient stated he had taken antibiotics a month ago for tooth  infection which was for 2 weeks.  ED Course: In the ER patient appearred generally weak but nonfocal.  Labs reveal potassium 2.5 creatinine increased to 1.2 lipase 20 LFTs were largely unremarkable CBC was showing leukocytosis of 20,000 hemoglobin 15 platelets 245.  While in the ER patient had another bowel movement which was sent for stool studies.  Patient was started on gentle hydration and admitted for weakness likely from severe diarrhea and further work-up for diarrhea.  EKG shows A. fib with slightly elevated heart rate.   Hospital Course:  1 C. difficile colitis Patient presented with severe diarrhea resulting in significant hypokalemia.  Concern for antibiotic induced diarrhea with recent use of antibiotics was initial concern versus ulcerative colitis flare.  C. difficile PCR was obtained which came back positive for antigen, toxin negative, toxigenic C. difficile positive.  Patient had presented with multiple loose stools, significant leukocytosis with a white count of 20,000, generalized weakness, significant hypokalemia.  GI consulted due to concern for ulcerative colitis flare.  Patient seen in consultation by GI who recommended patient needed to complete a two-week course of oral vancomycin 125 mg 4 times daily.  Due to positive CDiff result plans for colonoscopy were canceled.  GI recommended 10 mEq of potassium until bowels are back to normal of 3-4 formed stools per day.  Patient improved clinically.  Leukocytosis trended down and had resolved by day of discharge.  Patient remained afebrile.  Frequency of loose stools improved.  Patient was discharged home on 13 days of oral vancomycin.  Outpatient follow-up with GI.  2.  Significant hypokalemia Secondary to problem #1 secondary to GI losses.  Potassium on admission was 2.7 and went as low as 2.5.  Patient placed on IV fluids with potassium supplementation.  Hypokalemia improved.  By day of discharge patient's potassium was 3.4 and  magnesium at 2.1.  Patient's potassium was repleted prior to discharge.  Outpatient follow-up.   3.  Acute kidney injury Felt secondary to a prerenal azotemia secondary to significant GI losses.  Patient diuretics were initially held and patient hydrated with IV fluids.  Renal function improved.  Outpatient follow-up.   4.  Generalized weakness Secondary to problem #1.    5.  Permanent atrial fibrillation (cha2ds2vasc SCORE 5) Patient was maintained on Coreg during the hospitalization for rate control.  Pradaxa was held due to some blood streaking stools and patient placed on heparin drip.  Heparin drip was subsequently discontinued and patient resumed back on Pradaxa as GI were no longer planning any procedures.    6.  Obstructive sleep apnea CPAP nightly.  7.  Chronic combined systolic and diastolic heart failure Last EF 25 to 30% with grade 3 diastolic dysfunction.  Lasix, spironolactone, Entresto on hold due to dehydration and weakness on admission.  Patient improved clinically and diuretics were resumed on discharge.  Outpatient follow-up.  8.  History of coronary artery disease Remained stable.  Patient maintained on home regimen of Coreg Imdur and statin.  Plavix was initially held however resumed.   9.  Obesity  10 history of ulcerative colitis Continued on home regimen of  mesalamine.  Patient was followed by GI during the hospitalization.  11.  Hypothyroidism Patient was maintained on home regimen of Synthroid.  Outpatient follow-up.   Procedures:  None  Consultations:  Gastroenterology: Dr. Ardis Hughs 04/16/2019  Discharge Exam: Vitals:   04/18/19 0522 04/18/19 0939  BP: (!) 155/99 (!) 152/93  Pulse: 93 76  Resp: 18 18  Temp: 98.2 F (36.8 C) 97.6 F (36.4 C)  SpO2: 97% 98%    General: NAD Cardiovascular: RRR Respiratory: CTAB  Discharge Instructions   Discharge Instructions    Diet - low sodium heart healthy   Complete by: As directed     Increase activity slowly   Complete by: As directed      Allergies as of 04/18/2019   No Known Allergies     Medication List    STOP taking these medications   mesalamine 0.375 g 24 hr capsule Commonly known as: Apriso Replaced by: Apriso 0.375 g 24 hr capsule     TAKE these medications   acetaminophen 500 MG tablet Commonly known as: TYLENOL Take 1,000 mg by mouth every 6 (six) hours as needed for headache (pain).   Apriso 0.375 g 24 hr capsule Generic drug: mesalamine TAKE 4 CAPSULES (1.5 GRAMS TOTAL) BY MOUTH DAILY Replaces: mesalamine 0.375 g 24 hr capsule   atorvastatin 20 MG tablet Commonly known as: LIPITOR TAKE ONE TABLET (20MG TOTAL) BY MOUTH DAILY What changed: See the new instructions.   carvedilol 25 MG tablet Commonly known as: COREG TAKE ONE TABLET BY MOUTH TWICE A DAY What changed: when to take this   clopidogrel 75 MG tablet Commonly known as: PLAVIX Take 1 tablet (75 mg total) by mouth daily. What changed: when to take this   COQ10 PO Take 1 capsule by mouth daily with supper.   empagliflozin 10 MG Tabs tablet Commonly known as: Jardiance TAKE ONE (1) TABLET BY MOUTH EVERY DAY What changed:   how much to take  how to take this  when to take this  additional instructions   fish oil-omega-3 fatty acids 1000 MG capsule Take 1 g by mouth daily with supper.   furosemide 40 MG tablet Commonly known as: LASIX Take 1 tablet (40 mg total) by mouth daily. Take extra tablet once daily in the PM as needed for weight gain 3 lbs in 24 hrs or swelling. What changed:   when to take this  additional instructions   glipiZIDE 10 MG tablet Commonly known as: GLUCOTROL TAKE ONE TABLET BY MOUTH TWICE A DAY What changed: when to take this   glucosamine-chondroitin 500-400 MG tablet Take 1 tablet by mouth daily as needed (knee flare up or pain).   glucose blood test strip Use as instructed to test 4 time daily   isosorbide mononitrate 60 MG 24 hr  tablet Commonly known as: IMDUR TAKE ONE (1) TABLET BY MOUTH EVERY DAY What changed: See the new instructions.   metFORMIN 1000 MG tablet Commonly known as: GLUCOPHAGE TAKE ONE TABLET BY MOUTH TWICE A DAY WITH A MEAL What changed: See the new instructions.   potassium chloride 10 MEQ tablet Commonly known as: K-DUR Take 1 tablet (10 mEq total) by mouth daily for 5 days.   Pradaxa 150 MG Caps capsule Generic drug: dabigatran TAKE ONE (1) CAPSULE BY MOUTH TWICE A DAY. (EVERY 12 HOURS.) (EVERY 12 HOURS) What changed: See the new instructions.   sacubitril-valsartan 97-103 MG Commonly known as: Entresto Take 1 tablet by mouth 2 (two) times daily. What changed: when to take this   sitaGLIPtin 100 MG tablet Commonly known as: Januvia TAKE ONE (1) TABLET BY MOUTH EVERY DAY What changed:   how much to take  how to take this  when to take this  additional instructions   spironolactone 25 MG tablet Commonly known as: ALDACTONE TAKE ONE TABLET (25MG TOTAL) BY MOUTH DAILY What changed: See the new instructions.   Synthroid 50 MCG tablet Generic drug: levothyroxine TAKE 1 TABLET (50 MG TOTAL) BY MOUTH DAILY BEFORE BREAKFAST What changed: See the new instructions.   vancomycin 125 MG capsule Commonly known as: VANCOCIN Take 1 capsule (125 mg total) by mouth 4 (four) times daily.      No Known Allergies Follow-up Information    Marin Olp, MD. Schedule an appointment as soon as possible for a visit in 1 week(s).   Specialty: Family Medicine Why: f/u in 1-2 weeks. Contact information: [place] [address] [phone]        Sherren Mocha, MD .   Specialty: Cardiology Contact information: [phone] [address] [place] [address] [phone]        Ladene Artist, MD. Schedule an appointment as soon as possible for a visit in 4 week(s).   Specialty: Gastroenterology Why: office will call with appointment time.   Contact information: [address] [place] [address] [phone]            The results of significant diagnostics from this hospitalization (including imaging, microbiology, ancillary and laboratory) are listed below for reference.    Significant Diagnostic Studies: No results found.  Microbiology: Recent Results (from the past 240 hour(s))  SARS Coronavirus 2 (CEPHEID - Performed in [facility] hospital lab), Hosp Order     Status: None   Collection Time: 04/15/19  7:46 PM   Specimen: Nasopharyngeal Swab  Result Value Ref Range Status   SARS Coronavirus 2 NEGATIVE NEGATIVE Final    Comment: (NOTE) If result is NEGATIVE SARS-CoV-2 target  nucleic acids are NOT DETECTED. The SARS-CoV-2 RNA is generally detectable in upper and lower  respiratory specimens during the acute phase of infection. The lowest  concentration of SARS-CoV-2 viral copies this assay can detect is 250  copies / mL. A negative result does not preclude SARS-CoV-2 infection  and should not be used as the sole basis for treatment or other  patient management decisions.  A negative result may occur with  improper specimen collection / handling, submission of specimen other  than nasopharyngeal swab, presence of viral mutation(s) within the  areas targeted by this assay, and inadequate number of viral copies  (<250 copies / mL). A negative result must be combined with clinical  observations, patient history, and epidemiological information. If result is POSITIVE SARS-CoV-2 target nucleic acids are DETECTED. The SARS-CoV-2 RNA is generally detectable in upper and lower  respiratory specimens dur ing the acute phase of infection.  Positive  results are indicative of active infection with SARS-CoV-2.  Clinical  correlation with patient history and other diagnostic information is  necessary to determine patient infection status.  Positive results do  not rule out bacterial infection or co-infection  with other viruses. If result is PRESUMPTIVE POSTIVE SARS-CoV-2 nucleic acids MAY BE PRESENT.   A presumptive positive result was obtained on the submitted specimen  and confirmed on repeat testing.  While 2019 novel coronavirus  (SARS-CoV-2) nucleic acids may be present in the submitted sample  additional confirmatory testing may be necessary for epidemiological  and / or clinical management purposes  to differentiate between  SARS-CoV-2 and other Sarbecovirus currently known to infect humans.  If clinically indicated additional testing with an alternate test  methodology [phone]) is advised. The SARS-CoV-2 RNA is generally  detectable in upper and lower respiratory sp ecimens during the acute  phase of infection. The expected result is Negative. Fact Sheet for Patients:  StrictlyIdeas.no Fact Sheet for Healthcare Providers: BankingDealers.co.za This test is not yet approved or cleared by the [place] FDA and has been authorized for detection and/or diagnosis of SARS-CoV-2 by FDA under an Emergency Use Authorization (EUA).  This EUA will remain in effect (meaning this test can be used) for the duration of the COVID-19 declaration under Section 564(b)(1) of the Act, 21 U.S.C. section 360bbb-3(b)(1), unless the authorization is terminated or revoked sooner. Performed at [facility], [place] [address] [place], [place] [address]   Gastrointestinal Panel by PCR , Stool     Status: None   Collection Time: 04/15/19  9:07 PM   Specimen: Stool  Result Value Ref Range Status   Campylobacter species NOT DETECTED NOT DETECTED Final   Plesimonas shigelloides NOT DETECTED NOT DETECTED Final   Salmonella species NOT DETECTED NOT DETECTED Final   Yersinia enterocolitica NOT DETECTED NOT DETECTED Final   Vibrio species NOT DETECTED NOT DETECTED Final   Vibrio cholerae NOT DETECTED NOT DETECTED Final   Enteroaggregative E coli (EAEC) NOT  DETECTED NOT DETECTED Final   Enteropathogenic E coli (EPEC) NOT DETECTED NOT DETECTED Final   Enterotoxigenic E coli (ETEC) NOT DETECTED NOT DETECTED Final   Shiga like toxin producing E coli (STEC) NOT DETECTED NOT DETECTED Final   Shigella/Enteroinvasive E coli (EIEC) NOT DETECTED NOT DETECTED Final   Cryptosporidium NOT DETECTED NOT DETECTED Final   Cyclospora cayetanensis NOT DETECTED NOT DETECTED Final   Entamoeba histolytica NOT DETECTED NOT DETECTED Final   Giardia lamblia NOT DETECTED NOT DETECTED Final   Adenovirus F40/41 NOT DETECTED NOT  DETECTED Final   Astrovirus NOT DETECTED NOT DETECTED Final   Norovirus GI/GII NOT DETECTED NOT DETECTED Final   Rotavirus A NOT DETECTED NOT DETECTED Final   Sapovirus (I, II, IV, and V) NOT DETECTED NOT DETECTED Final    Comment: Performed at [facility], [place] [place], [place] [address]  C difficile quick scan w PCR reflex     Status: Abnormal   Collection Time: 04/16/19  2:51 PM   Specimen: STOOL  Result Value Ref Range Status   C Diff antigen POSITIVE (A) NEGATIVE Final   C Diff toxin NEGATIVE NEGATIVE Final   C Diff interpretation Results are indeterminate. See PCR results.  Final    Comment: Performed at [facility], [place] [address], [place], [place] [address]  C. Diff by PCR, Reflexed     Status: Abnormal   Collection Time: 04/16/19  2:51 PM  Result Value Ref Range Status   Toxigenic C. Difficile by PCR POSITIVE (A) NEGATIVE Final    Comment: Positive for toxigenic C. difficile with little to no toxin production. Only treat if clinical presentation suggests symptomatic illness. Performed at [facility], [place] [address] [place], [place] [address]      Labs: Basic Metabolic Panel: Recent Labs  Lab 04/15/19 1402 04/15/19 1717 04/15/19 1736 04/16/19 0444 04/17/19 1057 04/18/19 0346  NA 138 136  --  139 138 140  K 2.7* 2.5*  --  2.6* 3.8 3.4*  CL 99 98  --  105 106 108  CO2 27 25  --  22 23  22   GLUCOSE 169* 164*  --  174* 170* 134*  BUN 10 9  --  11 12 8   CREATININE 1.00 1.25*  --  1.09 0.97 0.86  CALCIUM 8.3* 8.4*  --  7.8* 8.0* 8.0*  MG  --   --  1.9 1.8 2.3 2.1   Liver Function Tests: Recent Labs  Lab 04/15/19 1717 04/16/19 0444  AST 16 11*  ALT 12 10  ALKPHOS 72 56  BILITOT 1.4* 1.1  PROT 6.2* 5.4*  ALBUMIN 3.4* 2.8*   Recent Labs  Lab 04/15/19 1717  LIPASE 20   No results for input(s): AMMONIA in the last 168 hours. CBC: Recent Labs  Lab 04/15/19 1402 04/15/19 1717 04/16/19 0444 04/17/19 0532 04/18/19 0346  WBC 20.0* 20.6* 17.1* 11.3* 9.8  NEUTROABS 18.0*  --  14.7*  --  7.9*  HGB 15.1 15.0 14.0 14.0 12.6*  HCT 45.1 45.9 42.1 42.9 38.5*  MCV 81.0 82.4 82.2 83.0 83.2  PLT 245.0 262 189 202 206   Cardiac Enzymes: No results for input(s): CKTOTAL, CKMB, CKMBINDEX, TROPONINI in the last 168 hours. BNP: BNP (last 3 results) Recent Labs    07/11/18 1531  BNP 174.5*    ProBNP (last 3 results) Recent Labs    04/25/18 0756 05/09/18 1217 05/28/18 1123  PROBNP 1,112* 764* 646*    CBG: Recent Labs  Lab 04/17/19 1129 04/17/19 1627 04/17/19 2147 04/18/19 0700 04/18/19 1120  GLUCAP 178* 143* 136* 130* 164*       Signed:  Irine Seal MD.  [facility] 04/18/2019, 12:25 PM

## 2019-04-18 NOTE — Progress Notes (Signed)
DISCHARGE NOTE  MARX DOIG to be discharged to Home per MD order. Patient verbalized understanding.  Skin clean, dry and intact without evidence of skin break down, no evidence of skin tears noted. IV catheter discontinued intact. Site without signs and symptoms of complications. Dressing and pressure applied. Pt denies pain at the site currently. No complaints noted.  Patient free of lines, drains, and wounds.   Discharge packet assembled. An After Visit Summary (AVS) was printed and given to  patient. Patient escorted via wheelchair and discharged to home via private auto.    Babs Sciara, RN

## 2019-04-19 ENCOUNTER — Telehealth: Payer: Self-pay | Admitting: Family Medicine

## 2019-04-19 NOTE — Telephone Encounter (Signed)
Please advise. No openings till 04/29/19. Copied from [place] [id number]. Topic: Appointment Scheduling - Scheduling Inquiry for Clinic >> Apr 19, 2019  4:51 PM Yvette Rack wrote: Reason for CRM: Pt granddaughter Chilton Greathouse called to schedule a hospital follow up appt with Dr. Yong Channel.

## 2019-04-22 ENCOUNTER — Telehealth: Payer: Self-pay

## 2019-04-22 MED ORDER — SACCHAROMYCES BOULARDII 250 MG PO CAPS: 250.0000 mg | ORAL_CAPSULE | Freq: Two times a day (BID) | ORAL | Status: AC

## 2019-04-22 NOTE — Telephone Encounter (Signed)
-----   Message from Ladene Artist, MD sent at 04/22/2019  1:24 PM EDT ----- Pt just discharged from hospital for C diff. Needs follow up appt with me or APP in about 4-5 weeks. Please be sure to reinforce importance of completing the entire prescription of vanco and take Florastor bid for 6 weeks.

## 2019-04-22 NOTE — Telephone Encounter (Signed)
Attempted TCM call on Friday, 04/19/2019 @ 4:49 pm, however, there was no discharge summary posted to this patient's chart.  Attempted TCM call again on Monday, 04/22/2019 @ 4:50 pm, and there is still no discharge summary in this patient's chart.  Unable to perform TCM call with no D/C summary in chart.

## 2019-04-22 NOTE — Telephone Encounter (Signed)
Called pt and scheduled hosp f/u for this coming Friday at 4:00. OK per Dr. Yong Channel.

## 2019-04-22 NOTE — Telephone Encounter (Signed)
Patient's daughter Sharyn Lull notified.  He has not been on Florastor.  She will get him started on that today.  He will keep the follow up that was previously scheduled for 05/20/19

## 2019-04-26 ENCOUNTER — Ambulatory Visit (INDEPENDENT_AMBULATORY_CARE_PROVIDER_SITE_OTHER): Payer: Medicare Other | Admitting: Family Medicine

## 2019-04-26 ENCOUNTER — Ambulatory Visit: Payer: Medicare Other | Admitting: Gastroenterology

## 2019-04-26 ENCOUNTER — Encounter: Payer: Self-pay | Admitting: Family Medicine

## 2019-04-26 ENCOUNTER — Other Ambulatory Visit: Payer: Self-pay

## 2019-04-26 VITALS — BP 134/84 | HR 95 | Temp 99.0°F | Ht 68.0 in | Wt 257.4 lb

## 2019-04-26 DIAGNOSIS — A0472 Enterocolitis due to Clostridium difficile, not specified as recurrent: Secondary | ICD-10-CM

## 2019-04-26 DIAGNOSIS — D72829 Elevated white blood cell count, unspecified: Secondary | ICD-10-CM | POA: Diagnosis not present

## 2019-04-26 DIAGNOSIS — K5100 Ulcerative (chronic) pancolitis without complications: Secondary | ICD-10-CM | POA: Diagnosis not present

## 2019-04-26 DIAGNOSIS — E039 Hypothyroidism, unspecified: Secondary | ICD-10-CM | POA: Diagnosis not present

## 2019-04-26 DIAGNOSIS — E1151 Type 2 diabetes mellitus with diabetic peripheral angiopathy without gangrene: Secondary | ICD-10-CM

## 2019-04-26 DIAGNOSIS — E876 Hypokalemia: Secondary | ICD-10-CM | POA: Diagnosis not present

## 2019-04-26 DIAGNOSIS — I4821 Permanent atrial fibrillation: Secondary | ICD-10-CM | POA: Diagnosis not present

## 2019-04-26 DIAGNOSIS — N179 Acute kidney failure, unspecified: Secondary | ICD-10-CM | POA: Diagnosis not present

## 2019-04-26 DIAGNOSIS — I25118 Atherosclerotic heart disease of native coronary artery with other forms of angina pectoris: Secondary | ICD-10-CM | POA: Diagnosis not present

## 2019-04-26 DIAGNOSIS — I5022 Chronic systolic (congestive) heart failure: Secondary | ICD-10-CM

## 2019-04-26 HISTORY — DX: Atherosclerotic heart disease of native coronary artery with other forms of angina pectoris: I25.118

## 2019-04-26 NOTE — Progress Notes (Signed)
Phone [phone]   Subjective:  [name] is a 73 y.o. year old very pleasant male patient who presents for transitional care management and hospital follow up for C. difficile which led to acute kidney injury and significant hypokalemia.  Patient was hospitalized from 04/15/2019 to 04/18/2019. A TCM phone call was attempted on 04/22/2019. Medical complexity  Moderate    Pt presented to the ED for weakness and multiple episodes of blood-streaked diarrhea for several days. He denied abd pain, n/v, CP, SOB. Did report poor appetite.  Patient states his initial concern was that he may have a flareup of his inflammatory bowel disease.  In the emergency room patient appeared generally weak but without focal weakness.  Potassium was as low as 2.7 in the emergency room and creatinine was elevated from baseline.  Patient had leukocytosis over 20,000.  Patient did have a bowel movement while in the emergency room and this was sent for stool studies. Apparently patient had taken clindamycin a month prior for dental procedure-this led to concern of potential Clostridium difficile.    #1  C. difficile colitis- when C. difficile PCR came back positive it was believed this was the most likely cause of his symptoms over ulcerative colitis flare.  GI evaluated patient and recommended 2-week course of vancomycin 125 mg 4 times daily.  Initially there was plan for colonoscopy but with positive C. difficile this was canceled.  With ongoing losses-patient was treated with IV fluids as well as potassium.  Patient stool frequency improved during hospitalization.  His leukocytosis trended down and resolved by time of discharge.  Patient remained afebrile.  COVID-19 testing was negative.  Outpatient follow-up with GI was planned in approximately a month from discharge.  Patient had generalized weakness related to GI losses-improved as GI losses improved.  Patient states overall strength is much improved. Last diarrhea was  in the hospital- none at home per patient-meaning no watery diarrhea.  Still having Loose stools - still 2x a day. Some form to the stool thankfully though and seems to be improving. Per granddaughter who is a Marine scientist and found in at the end of the visit- a few accidents/episodes of fecal incontinence. Mild upset stomach yesterday.  Patient still has vancomycin to take through the 22nd and then had GI follow-up early August - History of ulcerative colitis- patient was maintained on home mesalamine.  Patient typically followed by Dr. start outpatient but was followed by Dr. Ardis Hughs inpatient. -grandDaughter also reports she was told her father should take Florastor twice a day until GI follow-up.  Up until this point he has been taking the [facility] probiotic  #2 significant hypokalemia-secondary to GI losses.  Repleted during hospitalization from level of 2.5.  Potassium at discharge was 3.4 with magnesium at 2.1.Plan for discharge summary was 10 mEq of potassium until bowels are back to normal but patient was only discharged with 5 days of 10 mEq potassium.  Patient completed this course and would like to update his potassium today. -Patient reports years ago he was on potassium regularly.  We discussed likely not currently on potassium due to Entresto and spironolactone use  #3 acute kidney injury- also secondary to GI losses with associated prerenal azotemia.  Diuretics were held during hospitalization.  Plan was for outpatient follow-up after improvement of renal function during hospitalization with IV fluids.  #4 permanent atrial fibrillation- patient was maintained on carvedilol during hospitalization for rate control.  Pradaxa was held initially due to blood-streaked stool and potential  for GI procedure- when colonoscopy was removed as part of the plan- Pradaxa was restarted  #5 congestive heart failure-combined systolic and diastolic and coronary artery disease with angina- Lasix, spironolactone and  Entresto were initially held due to dehydration- as patient clinically improved these were reintroduced and patient is compliant with these at home.  Patient was continued on carvedilol during hospitalization -For CAD-patient was maintained on carvedilol, Imdur, statin.  Plavix was initially held due to blood in stool and plan for colonoscopy-later restarted before discharge.  #6.  Hypothyroidism-patient was maintained on home Synthroid  #7.   Type 2 diabetes- followed closely by Dr. Cruzita Lederer.  Typically follows with fructosamine.  Last A1c over 6 months ago  # 8 Obstructive sleep apnea-continue on CPAP nightly   See problem oriented charting as well ROS-denies watery stool- still having loose stools twice a day but becoming more formed.  No fever or chills.  No chest pain or shortness of breath reported.  Does have some fatigue which is improving.  Past Medical History-  Patient Active Problem List   Diagnosis Date Noted   History of transient ischemic attack (TIA) 10/24/2015    Priority: High   CHF (congestive heart failure), NYHA class II ([place]) 05/09/2014    Priority: High   Ulcerative pancolitis without complication ([place]) 38/18/2993    Priority: High   Atrial fibrillation ([place]) 12/08/2010    Priority: High   Type II diabetes mellitus with peripheral circulatory disorder ([place]) 04/16/2007    Priority: High   CAD (coronary artery disease), native coronary artery 04/12/2007    Priority: High   Morbid obesity ([place]) 08/09/2011    Priority: Medium   Hyperlipidemia 04/16/2007    Priority: Medium   Hypothyroidism 04/12/2007    Priority: Medium   Essential hypertension 04/12/2007    Priority: Medium   OSA (obstructive sleep apnea) 04/12/2007    Priority: Medium   Former smoker 08/11/2016    Priority: Low   MCI (mild cognitive impairment) 01/19/2016    Priority: Low   Primary osteoarthritis of left knee 02/09/2015    Priority: Low   Allergic rhinitis 06/25/2012     Priority: Low   GERD (gastroesophageal reflux disease) 08/09/2011    Priority: Low   VITAMIN B12 DEFICIENCY 11/29/2010    Priority: Low   Atherosclerotic heart disease of native coronary artery with other forms of angina pectoris ([place]) 04/26/2019   C. difficile colitis 04/17/2019   Dehydration 04/15/2019   Diarrhea 04/15/2019   ARF (acute renal failure) ([place]) 04/15/2019   Chronic combined systolic and diastolic CHF (congestive heart failure) ([place]) 04/15/2019   Hypokalemia 04/15/2019   AKI (acute kidney injury) ([place])    Acute on chronic combined systolic and diastolic CHF (congestive heart failure) ([place]) 02/07/2018    Medications- reviewed and updated  A medical reconciliation was performed comparing current medicines to hospital discharge medications. Current Outpatient Medications  Medication Sig Dispense Refill   acetaminophen (TYLENOL) 500 MG tablet Take 1,000 mg by mouth every 6 (six) hours as needed for headache (pain).     APRISO 0.375 g 24 hr capsule TAKE 4 CAPSULES (1.5 GRAMS TOTAL) BY MOUTH DAILY 120 capsule 0   atorvastatin (LIPITOR) 20 MG tablet TAKE ONE TABLET (20MG TOTAL) BY MOUTH DAILY (Patient taking differently: Take 20 mg by mouth daily with breakfast. ) 90 tablet 1   carvedilol (COREG) 25 MG tablet TAKE ONE TABLET BY MOUTH TWICE A DAY (Patient taking differently: Take 25 mg by mouth 2 (two) times  daily with a meal. ) 180 tablet 2   clopidogrel (PLAVIX) 75 MG tablet Take 1 tablet (75 mg total) by mouth daily. (Patient taking differently: Take 75 mg by mouth daily with breakfast. ) 90 tablet 3   Coenzyme Q10 (COQ10 PO) Take 1 capsule by mouth daily with supper.     empagliflozin (JARDIANCE) 10 MG TABS tablet TAKE ONE (1) TABLET BY MOUTH EVERY DAY (Patient taking differently: Take 10 mg by mouth daily with breakfast. ) 90 tablet 3   fish oil-omega-3 fatty acids 1000 MG capsule Take 1 g by mouth daily with supper.      furosemide (LASIX) 40 MG tablet Take  1 tablet (40 mg total) by mouth daily. Take extra tablet once daily in the PM as needed for weight gain 3 lbs in 24 hrs or swelling. (Patient taking differently: Take 40 mg by mouth See admin instructions. Take one tablet (40 mg) by mouth daily with breakfast, may take an extra tablet once daily in the evening as needed for weight gain 3 lbs in 24 hrs or swelling.) 30 tablet 11   glipiZIDE (GLUCOTROL) 10 MG tablet TAKE ONE TABLET BY MOUTH TWICE A DAY (Patient taking differently: Take 10 mg by mouth 2 (two) times daily before a meal. ) 180 tablet 2   glucosamine-chondroitin 500-400 MG tablet Take 1 tablet by mouth daily as needed (knee flare up or pain).     glucose blood test strip Use as instructed to test 4 time daily 400 each 3   isosorbide mononitrate (IMDUR) 60 MG 24 hr tablet TAKE ONE (1) TABLET BY MOUTH EVERY DAY (Patient taking differently: Take 60 mg by mouth daily with breakfast. ) 90 tablet 3   metFORMIN (GLUCOPHAGE) 1000 MG tablet TAKE ONE TABLET BY MOUTH TWICE A DAY WITH A MEAL (Patient taking differently: Take 1,000 mg by mouth 2 (two) times daily with a meal. ) 180 tablet 1   PRADAXA 150 MG CAPS capsule TAKE ONE (1) CAPSULE BY MOUTH TWICE A DAY. (EVERY 12 HOURS.) (EVERY 12 HOURS) (Patient taking differently: Take 150 mg by mouth 2 (two) times daily with a meal. ) 60 capsule 6   saccharomyces boulardii (FLORASTOR) 250 MG capsule Take 1 capsule (250 mg total) by mouth 2 (two) times daily.     sacubitril-valsartan (ENTRESTO) 97-103 MG Take 1 tablet by mouth 2 (two) times daily. (Patient taking differently: Take 1 tablet by mouth 2 (two) times daily with a meal. ) 60 tablet 6   sitaGLIPtin (JANUVIA) 100 MG tablet TAKE ONE (1) TABLET BY MOUTH EVERY DAY (Patient taking differently: Take 100 mg by mouth daily with breakfast. ) 90 tablet 2   spironolactone (ALDACTONE) 25 MG tablet TAKE ONE TABLET (25MG TOTAL) BY MOUTH DAILY (Patient taking differently: Take 25 mg by mouth daily with  breakfast. ) 90 tablet 1   SYNTHROID 50 MCG tablet TAKE 1 TABLET (50 MG TOTAL) BY MOUTH DAILY BEFORE BREAKFAST (Patient taking differently: Take 50 mcg by mouth daily before breakfast. ) 90 tablet 3   vancomycin (VANCOCIN) 125 MG capsule Take 1 capsule (125 mg total) by mouth 4 (four) times daily. 52 capsule 0   potassium chloride (K-DUR) 10 MEQ tablet Take 1 tablet (10 mEq total) by mouth daily for 5 days. 5 tablet 0   No current facility-administered medications for this visit.    Objective  Objective:  BP 134/84 (BP Location: Left Arm, Patient Position: Sitting, Cuff Size: Normal)    Pulse 95  Temp 99 F (37.2 C) (Oral)    Ht 5' 8"  (1.727 m)    Wt 257 lb 6.4 oz (116.8 kg)    SpO2 97%    BMI 39.14 kg/m  Gen: NAD, resting comfortably CV: RRR no murmurs rubs or gallops Lungs: CTAB no crackles, wheeze, rhonchi Abdomen: soft/nontender/nondistended/normal bowel sounds. No rebound or guarding.  Ext: Trace edema Skin: warm, dry   Diabetic Foot Exam - Simple   Simple Foot Form Diabetic Foot exam was performed with the following findings: Yes 04/26/2019  4:38 PM  Visual Inspection See comments: Yes Sensation Testing Intact to touch and monofilament testing bilaterally: Yes Pulse Check Posterior Tibialis and Dorsalis pulse intact bilaterally: Yes Comments callous at IP joint bilaterally, onychomycosis diffusely, right foot hammer toe 2nd and bunion deformity at first toe       Assessment and Plan:   C. difficile colitis - Plan: Appears to be improving-encouraged patient to finish vancomycin course-if he has new or worsening symptoms he should let us know immediately.  Hypokalemia - Plan: Noted during hospitalization- now with patient back on spironolactone and Entresto hopefully maintaining without potassium- update potassium and magnesium today  Acute kidney injury ([place]) - Plan: Noted during hospitalization- update BMP today- worse creatinine was 1.25 and was down to 0.86 at  discharge.  Permanent atrial fibrillation - Plan: Continue Pradaxa for anticoagulation given no longer having bloody stools.  Continue carvedilol for rate control  Chronic systolic congestive heart failure, NYHA class 2 (HCC) - Plan: Appears stable-continue Lasix, spironolactone, carvedilol, Entresto  CAD- remains chest pain-free on Imdur-we will continue this.  He is on Plavix for antiplatelet.  Type II diabetes mellitus with peripheral circulatory disorder (HCC) - Plan: Following closely with endocrinology.  His labs are primarily measured by fructosamine- A1c not as helpful but required for metrics unfortunately- we will check A1c with labs  Leukocytosis, unspecified type - Plan: we will repeat CBC today  Hypothyroidism, unspecified type - Plan: TSH, TSH, CANCELED: TSH, has been controlled, update TSH with labs  Ulcerative pancolitis without complication ([place]) - Plan: Has close GI follow-up- this episode did not appear to be related to ulcerative colitis.  Continue mesalamine  Recommended follow up: Follow-up in November planned Future Appointments  Date Time Provider [place]  05/20/2019  1:50 PM Ladene Artist, MD [facility]  07/03/2019 10:15 AM Philemon Kingdom, MD [facility] None  08/26/2019 11:00 AM Marin Olp, MD [facility]    Lab/Order associations:   ICD-10-CM   1. C. difficile colitis  A04.72   2. Acute kidney injury ([place])  N17.9   3. Chronic systolic congestive heart failure, NYHA class 2 (HCC)  I50.22   4. Atherosclerotic heart disease of native coronary artery with other forms of angina pectoris (HCC) Chronic I25.118   5. Type II diabetes mellitus with peripheral circulatory disorder (HCC)  E11.51 Hemoglobin A1c    Comprehensive metabolic panel    CBC with Differential/Platelet    CBC with Differential/Platelet    Comprehensive metabolic panel    Hemoglobin A1c    CANCELED: CBC with Differential/Platelet    CANCELED: Comprehensive  metabolic panel    CANCELED: Hemoglobin A1c  6. Ulcerative pancolitis without complication ([place])  S01.09   7. Hypomagnesemia  E83.42 Magnesium    Magnesium    CANCELED: Magnesium  8. Leukocytosis, unspecified type  D72.829 CBC with Differential/Platelet    CBC with Differential/Platelet    CANCELED: CBC with Differential/Platelet  9. Permanent atrial fibrillation  I48.21  10. Hypokalemia  E87.6   11. Hypothyroidism, unspecified type  E03.9 TSH    TSH    CANCELED: TSH   Return precautions advised.  Garret Reddish, MD

## 2019-04-26 NOTE — Patient Instructions (Addendum)
Health Maintenance Due  Topic Date Due  . FOOT EXAM - today 01/26/2019  . HEMOGLOBIN A1C - today with labs 04/24/2019   If loose stools worsen- please let me or Dr. Fuller Plan know  Please stop by lab before you go If you do not have mychart- we will call you about results within 5 business days of [place] receiving them.  If you have mychart- we will send your results within 3 business days of [place] receiving them.  If abnormal or we want to clarify a result, we will call or mychart you to make sure you receive the message.  If you have questions or concerns or don't hear within 5-7 days, please send [place] a message or call us.

## 2019-04-27 LAB — COMPREHENSIVE METABOLIC PANEL
AG Ratio: 1.8 (calc) (ref 1.0–2.5)
ALT: 11 U/L (ref 9–46)
AST: 13 U/L (ref 10–35)
Albumin: 3.9 g/dL (ref 3.6–5.1)
Alkaline phosphatase (APISO): 63 U/L (ref 35–144)
BUN: 10 mg/dL (ref 7–25)
CO2: 27 mmol/L (ref 20–32)
Calcium: 9.2 mg/dL (ref 8.6–10.3)
Chloride: 109 mmol/L (ref 98–110)
Creat: 1.04 mg/dL (ref 0.70–1.18)
Globulin: 2.2 g/dL (calc) (ref 1.9–3.7)
Glucose, Bld: 107 mg/dL — ABNORMAL HIGH (ref 65–99)
Potassium: 4.8 mmol/L (ref 3.5–5.3)
Sodium: 153 mmol/L — ABNORMAL HIGH (ref 135–146)
Total Bilirubin: 0.6 mg/dL (ref 0.2–1.2)
Total Protein: 6.1 g/dL (ref 6.1–8.1)

## 2019-04-27 LAB — HEMOGLOBIN A1C
Hgb A1c MFr Bld: 6.9 % of total Hgb — ABNORMAL HIGH (ref <5.7)
Mean Plasma Glucose: 151 (calc)
eAG (mmol/L): 8.4 (calc)

## 2019-04-27 LAB — TSH: TSH: 1.17 mIU/L (ref 0.40–4.50)

## 2019-04-27 LAB — CBC WITH DIFFERENTIAL/PLATELET
Absolute Monocytes: 707 cells/uL (ref 200–950)
Basophils Absolute: 28 cells/uL (ref 0–200)
Basophils Relative: 0.3 %
Eosinophils Absolute: 140 cells/uL (ref 15–500)
Eosinophils Relative: 1.5 %
HCT: 42.8 % (ref 38.5–50.0)
Hemoglobin: 14.2 g/dL (ref 13.2–17.1)
Lymphs Abs: 1274 cells/uL (ref 850–3900)
MCH: 27.3 pg (ref 27.0–33.0)
MCHC: 33.2 g/dL (ref 32.0–36.0)
MCV: 82.3 fL (ref 80.0–100.0)
MPV: 10.7 fL (ref 7.5–12.5)
Monocytes Relative: 7.6 %
Neutro Abs: 7152 cells/uL (ref 1500–7800)
Neutrophils Relative %: 76.9 %
Platelets: 200 10*3/uL (ref 140–400)
RBC: 5.2 10*6/uL (ref 4.20–5.80)
RDW: 15.3 % — ABNORMAL HIGH (ref 11.0–15.0)
Total Lymphocyte: 13.7 %
WBC: 9.3 10*3/uL (ref 3.8–10.8)

## 2019-04-27 LAB — EXTRA SPECIMEN

## 2019-04-27 LAB — MAGNESIUM: Magnesium: 1.8 mg/dL (ref 1.5–2.5)

## 2019-04-29 ENCOUNTER — Other Ambulatory Visit: Payer: Self-pay | Admitting: Physical Therapy

## 2019-04-29 DIAGNOSIS — E870 Hyperosmolality and hypernatremia: Secondary | ICD-10-CM

## 2019-05-01 ENCOUNTER — Encounter: Payer: Self-pay | Admitting: Family Medicine

## 2019-05-06 ENCOUNTER — Other Ambulatory Visit (INDEPENDENT_AMBULATORY_CARE_PROVIDER_SITE_OTHER): Payer: Medicare Other

## 2019-05-06 ENCOUNTER — Other Ambulatory Visit: Payer: Self-pay

## 2019-05-06 DIAGNOSIS — E870 Hyperosmolality and hypernatremia: Secondary | ICD-10-CM | POA: Diagnosis not present

## 2019-05-07 ENCOUNTER — Telehealth: Payer: Self-pay

## 2019-05-07 ENCOUNTER — Telehealth: Payer: Self-pay | Admitting: Gastroenterology

## 2019-05-07 DIAGNOSIS — R197 Diarrhea, unspecified: Secondary | ICD-10-CM

## 2019-05-07 LAB — COMPREHENSIVE METABOLIC PANEL
ALT: 8 U/L (ref 0–53)
AST: 11 U/L (ref 0–37)
Albumin: 4.1 g/dL (ref 3.5–5.2)
Alkaline Phosphatase: 82 U/L (ref 39–117)
BUN: 12 mg/dL (ref 6–23)
CO2: 30 mEq/L (ref 19–32)
Calcium: 9 mg/dL (ref 8.4–10.5)
Chloride: 102 mEq/L (ref 96–112)
Creatinine, Ser: 1.15 mg/dL (ref 0.40–1.50)
GFR: 62.29 mL/min (ref >60.00)
Glucose, Bld: 103 mg/dL — ABNORMAL HIGH (ref 70–99)
Potassium: 4.1 mEq/L (ref 3.5–5.1)
Sodium: 138 mEq/L (ref 135–145)
Total Bilirubin: 1.3 mg/dL — ABNORMAL HIGH (ref 0.2–1.2)
Total Protein: 6.1 g/dL (ref 6.0–8.3)

## 2019-05-07 NOTE — Telephone Encounter (Signed)
Patient is having 3-4 loose stools a day with urgency again/  Started 3 days ago. Marland Kitchen  He completed his vancomycin on 05/01/19 for c-diff.  Please advise

## 2019-05-07 NOTE — Telephone Encounter (Signed)
GI pathogen panel Continue Florastor po bid for 6 more weeks Avoid lactose, raw fruits, raw vegetables, high fat foods  No more than 2 beverages with caffeine daily

## 2019-05-07 NOTE — Telephone Encounter (Signed)
Copied from [place] [phone]. Topic: General - Call Back - No Documentation >> May 07, 2019 12:44 PM Erick Blinks wrote: Reason for CRM: Pt's granddaughter called requesting call back regarding lab results  [place]: [phone] (granddaughter)

## 2019-05-07 NOTE — Telephone Encounter (Signed)
Patient notified He will come for stool studies asap

## 2019-05-08 ENCOUNTER — Other Ambulatory Visit: Payer: Medicare Other

## 2019-05-08 ENCOUNTER — Other Ambulatory Visit: Payer: Self-pay | Admitting: Gastroenterology

## 2019-05-08 DIAGNOSIS — R197 Diarrhea, unspecified: Secondary | ICD-10-CM

## 2019-05-09 ENCOUNTER — Telehealth: Payer: Self-pay | Admitting: Gastroenterology

## 2019-05-09 LAB — GASTROINTESTINAL PATHOGEN PANEL PCR
C. difficile Tox A/B, PCR: DETECTED — AB
Campylobacter, PCR: NOT DETECTED
Cryptosporidium, PCR: NOT DETECTED
E coli (ETEC) LT/ST PCR: NOT DETECTED
E coli (STEC) stx1/stx2, PCR: NOT DETECTED
E coli 0157, PCR: NOT DETECTED
Giardia lamblia, PCR: NOT DETECTED
Norovirus, PCR: NOT DETECTED
Rotavirus A, PCR: NOT DETECTED
Salmonella, PCR: NOT DETECTED
Shigella, PCR: NOT DETECTED

## 2019-05-09 NOTE — Telephone Encounter (Signed)
Patient grand-daughter called would like to know how long does it take to get the stool test results because he is starting to have loss stools and she does not want him to end up at the hospital

## 2019-05-09 NOTE — Telephone Encounter (Signed)
Pt's granddaughter would also like to add that pt is having loose stool and would like to know if anything in the lab results might be related.  Please advise.

## 2019-05-09 NOTE — Telephone Encounter (Signed)
grandaughter called to report that patient is having more loose stools.  Pathogen panel was turned in yesterday.  They are encouraged to push fluids and have patient drink at least 8 oz of fluid after each BM.  They are advised that hopefully result will be back by tomorrow.  They are concerned about dehydration until the results return.  I again encouraged to push po fluids until Dr. Fuller Plan has an opportunity to review.

## 2019-05-10 ENCOUNTER — Other Ambulatory Visit: Payer: Self-pay

## 2019-05-10 MED ORDER — DIFICID 200 MG PO TABS: 200.0000 mg | ORAL_TABLET | Freq: Two times a day (BID) | ORAL | 0 refills | Status: DC

## 2019-05-10 MED FILL — DIFICID 200 MG TABLET: 200 | 10 days supply | Qty: 20 | Fill #0

## 2019-05-10 NOTE — Progress Notes (Signed)
dific

## 2019-05-10 NOTE — Telephone Encounter (Signed)
Spoke to [employer] and she advised that Dr. Fuller Plan office reached out to her. She is aware that the loose stool come from C. Diff. She was given up dated lab results from 7/27 reagarding sodium level back at normal range. Sharyn Lull was advised that Dr. Yong Channel was out of the office until Tuesday.   If Dr.Hunter has anymore concerns regarding f/u labs results I would reach out to pt and [place].

## 2019-05-13 NOTE — Telephone Encounter (Signed)
I will respond on lab result note

## 2019-05-16 ENCOUNTER — Telehealth: Payer: Self-pay | Admitting: Gastroenterology

## 2019-05-16 NOTE — Telephone Encounter (Signed)
Left message for patient to call back  

## 2019-05-16 NOTE — Telephone Encounter (Signed)
Pt's granddaughter Sharyn Lull stated that pt had spoken to you yesterday regarding C. Diff.  Pt is confused on whether to continue taking antibiotics.

## 2019-05-17 NOTE — Telephone Encounter (Signed)
Patient's daughter notified that he does not need a refill of dificid at this point.  He should finish the Dificid.  He should remain on the florastor for 6 weeks after finishing the dificid.  She verbalized understanding.

## 2019-05-20 ENCOUNTER — Ambulatory Visit: Payer: Medicare Other | Admitting: Gastroenterology

## 2019-05-29 ENCOUNTER — Telehealth: Payer: Self-pay | Admitting: Gastroenterology

## 2019-05-29 NOTE — Telephone Encounter (Signed)
Granddaughter reported that pt has finished his 2nd antibiotics--she stated that not sure of OV is necessary or whether Dr. Fuller Plan would like another stool test or another round of antibiotics.  Please advise.

## 2019-05-30 NOTE — Telephone Encounter (Signed)
Agree 

## 2019-05-30 NOTE — Telephone Encounter (Signed)
I spoke with the patient.  He reports occasional semiformed stool, but no diarrhea.  He has a hx of UC as well and is maintained on Apriso.  He states that somedays has loose stool then others formed. He is taking Florastor BID and has follow up with you on 06/11/19.  He is advised to continue on Florastor and that he should call if diarrhea returns.

## 2019-06-01 ENCOUNTER — Other Ambulatory Visit: Payer: Self-pay | Admitting: Cardiovascular Disease

## 2019-06-11 ENCOUNTER — Ambulatory Visit (INDEPENDENT_AMBULATORY_CARE_PROVIDER_SITE_OTHER): Payer: Medicare Other | Admitting: Gastroenterology

## 2019-06-11 ENCOUNTER — Encounter: Payer: Self-pay | Admitting: Gastroenterology

## 2019-06-11 VITALS — BP 126/74 | HR 88 | Temp 98.2°F | Ht 66.0 in | Wt 237.1 lb

## 2019-06-11 DIAGNOSIS — I255 Ischemic cardiomyopathy: Secondary | ICD-10-CM | POA: Diagnosis not present

## 2019-06-11 DIAGNOSIS — A0472 Enterocolitis due to Clostridium difficile, not specified as recurrent: Secondary | ICD-10-CM

## 2019-06-11 DIAGNOSIS — K5100 Ulcerative (chronic) pancolitis without complications: Secondary | ICD-10-CM | POA: Diagnosis not present

## 2019-06-11 DIAGNOSIS — K645 Perianal venous thrombosis: Secondary | ICD-10-CM

## 2019-06-11 MED ORDER — VANCOMYCIN HCL 250 MG PO CAPS: ORAL_CAPSULE | ORAL | 0 refills | Status: DC

## 2019-06-11 MED FILL — VANCOMYCIN HCL 250 MG CAP: 250 | 42 days supply | Qty: 85 | Fill #0

## 2019-06-11 NOTE — Progress Notes (Signed)
    History of Present Illness: This is a 73 year old male with atrial fibrillation, CAD, CHF, DM, OSA history TIA, history of CVA maintained on Plavix and Pradaxa with ulcerative pancolitis.  He is accompanied by his wife.  He was hospitalized in early July with looser and more urgent stools for 2 months with small amounts of rectal bleeding. C. difficile was diagnosed and he was placed on a 14-day course of vancomycin.  He was advised to take Florastor twice daily for 6 weeks.  He was advised to remain on his maintenance dose of Apriso 1.5 g daily.  He relates his stools became firmer and appetite improved as he completed his 14-day course of vancomycin and the improvement lasted for about a week and gradually his stools have become slightly looser 2-3 times per day and his appetite has decreased.  He notes swelling and discomfort in his anal area consistent with prior hemorrhoid symptoms.  He has not noticed any recurrent bleeding.  Current Medications, Allergies, Past Medical History, Past Surgical History, Family History and Social History were reviewed in [employer] record.   Physical Exam: General: Well developed, well nourished, obese, chronically ill appearing, no acute distress Head: Normocephalic and atraumatic Eyes:  sclerae anicteric, EOMI Ears: Normal auditory acuity Mouth: No deformity or lesions Lungs: Clear throughout to auscultation Heart: Regular rate and rhythm; no murmurs, rubs or bruits Abdomen: Soft, large non tender and non distended. No masses, hepatosplenomegaly or hernias noted. Normal Bowel sounds Rectal: Not done Musculoskeletal: Symmetrical with no gross deformities  Pulses:  Normal pulses noted Extremities: No clubbing, cyanosis, edema or deformities noted Neurological: Alert oriented x 4, grossly nonfocal Psychological:  Alert and cooperative. Normal mood and affect   Assessment and Recommendations:   1. C diff diarrhea.  Current  symptoms consistent with a relapse.  Plan for a pulse taper vancomycin regimen as outlined.  Continue Florastor twice daily for 3 months.  Avoid all milk products including yogurt.  Minimize caffeine.  Attention to raw fruits and vegetables possibly worsening symptoms. REV in 8 weeks.   2.  Ulcerative colitis, pancolitis.  Continue Apriso 1.5 g daily.  Given his multiple comorbidities and ongoing need for anticoagulation he is at a high risk for complications for endoscopic procedures with sedation.    3. Hemorrhoids.  Begin Preparation H HC twice daily as needed, RectiCare twice daily as needed and standard rectal care instructions.

## 2019-06-11 NOTE — Patient Instructions (Addendum)
We have sent the following medications to your pharmacy for you to pick up at your convenience: Vancomycin pulse taper:   One tablet by mouth four times a day x 14 days, reduce to one tablet by mouth twice daily x 7 days, reduce to one tablet by mouth once daily x 7 days, reduce to one tablet by mouth every other day x 14 days. Then stop.  Remain on Florastor twice daily x 3 months.   Avoid all milk products including yogurt.   Please follow up in 8 weeks with Dr. Fuller Plan. We do not have a schedule at this time for November. Please call back in a week to 2 weeks to schedule a 8 week follow up visit.   You can purchase over the counter preparation HC cream to use rectally twice daily and recticare twice daily.   RECTAL CARE INSTRUCTIONS:  1. Sitz Baths twice a day for 10 minutes each. 2. Thoroughly clean and dry the rectum. 3. Put Tucks pad against the rectum at night. 4. Clean the rectum with Balenol lotion after each bowel movement.  Normal BMI (Body Mass Index- based on height and weight) is between 23 and 30. Your BMI today is Body mass index is 38.27 kg/m. Marland Kitchen Please consider follow up  regarding your BMI with your Primary Care Provider.  Thank you for choosing me and [facility].  Pricilla Riffle. Dagoberto Ligas., MD., Marval Regal

## 2019-06-13 ENCOUNTER — Encounter: Payer: Self-pay | Admitting: Family Medicine

## 2019-06-13 ENCOUNTER — Other Ambulatory Visit: Payer: Self-pay

## 2019-06-13 ENCOUNTER — Ambulatory Visit (INDEPENDENT_AMBULATORY_CARE_PROVIDER_SITE_OTHER): Payer: Medicare Other | Admitting: Family Medicine

## 2019-06-13 ENCOUNTER — Telehealth: Payer: Self-pay

## 2019-06-13 VITALS — BP 110/80 | HR 90 | Temp 97.5°F | Ht 66.0 in | Wt 236.0 lb

## 2019-06-13 DIAGNOSIS — R634 Abnormal weight loss: Secondary | ICD-10-CM | POA: Diagnosis not present

## 2019-06-13 DIAGNOSIS — E860 Dehydration: Secondary | ICD-10-CM | POA: Diagnosis not present

## 2019-06-13 DIAGNOSIS — R197 Diarrhea, unspecified: Secondary | ICD-10-CM | POA: Diagnosis not present

## 2019-06-13 DIAGNOSIS — E1151 Type 2 diabetes mellitus with diabetic peripheral angiopathy without gangrene: Secondary | ICD-10-CM

## 2019-06-13 DIAGNOSIS — D72828 Other elevated white blood cell count: Secondary | ICD-10-CM

## 2019-06-13 DIAGNOSIS — A0472 Enterocolitis due to Clostridium difficile, not specified as recurrent: Secondary | ICD-10-CM | POA: Diagnosis not present

## 2019-06-13 LAB — CBC
HCT: 45.8 % (ref 39.0–52.0)
Hemoglobin: 14.8 g/dL (ref 13.0–17.0)
MCHC: 32.4 g/dL (ref 30.0–36.0)
MCV: 83.6 fl (ref 78.0–100.0)
Platelets: 270 10*3/uL (ref 150.0–400.0)
RBC: 5.48 Mil/uL (ref 4.22–5.81)
RDW: 16 % — ABNORMAL HIGH (ref 11.5–15.5)
WBC: 17.8 10*3/uL — ABNORMAL HIGH (ref 4.0–10.5)

## 2019-06-13 LAB — COMPREHENSIVE METABOLIC PANEL
ALT: 6 U/L (ref 0–53)
AST: 11 U/L (ref 0–37)
Albumin: 3.5 g/dL (ref 3.5–5.2)
Alkaline Phosphatase: 80 U/L (ref 39–117)
BUN: 8 mg/dL (ref 6–23)
CO2: 30 mEq/L (ref 19–32)
Calcium: 8.7 mg/dL (ref 8.4–10.5)
Chloride: 99 mEq/L (ref 96–112)
Creatinine, Ser: 1.01 mg/dL (ref 0.40–1.50)
GFR: 72.33 mL/min (ref >60.00)
Glucose, Bld: 120 mg/dL — ABNORMAL HIGH (ref 70–99)
Potassium: 3.3 mEq/L — ABNORMAL LOW (ref 3.5–5.1)
Sodium: 141 mEq/L (ref 135–145)
Total Bilirubin: 1.1 mg/dL (ref 0.2–1.2)
Total Protein: 5.9 g/dL — ABNORMAL LOW (ref 6.0–8.3)

## 2019-06-13 LAB — POC URINALSYSI DIPSTICK (AUTOMATED)
Bilirubin, UA: NEGATIVE
Blood, UA: POSITIVE
Glucose, UA: POSITIVE — AB
Ketones, UA: POSITIVE
Leukocytes, UA: NEGATIVE
Nitrite, UA: NEGATIVE
Protein, UA: POSITIVE — AB
Spec Grav, UA: 1.015 (ref 1.010–1.025)
Urobilinogen, UA: 0.2 E.U./dL
pH, UA: 6 (ref 5.0–8.0)

## 2019-06-13 LAB — TSH: TSH: 0.62 u[IU]/mL (ref 0.35–4.50)

## 2019-06-13 LAB — GLUCOSE, POCT (MANUAL RESULT ENTRY): POC Glucose: 117 mg/dl — AB (ref 70–99)

## 2019-06-13 NOTE — Progress Notes (Signed)
Please inform patient of the following:  His white blood cell counts are high and his potasisum is low. Everything else is NORMAL.  Would like for him to make sure he is getting potassium in his diet as we discussed.  I think his elevated blood counts are probably due to his infection. But would like forhim to come back and recheck in a week or so.  Please place future order for CBC with differential.  Caleb M. [name], MD 06/13/2019 4:28 PM

## 2019-06-13 NOTE — Telephone Encounter (Signed)
Spoke to [place] pt's granddaughter. Told her Dr. [name] is willing to see pt in the office at the same time 10:00AM but if pt is too weak to walk or get in car need to go to the ER and not come to the office. Sharyn Lull verbalized understanding and said he can get in the car just can't walk long distance. Told her okay but if not please go to the ER. Sharyn Lull verbalized understanding.

## 2019-06-13 NOTE — Progress Notes (Signed)
   Chief Complaint:  [name] is a 73 y.o. male who presents for same day appointment with a chief complaint of weight loss. Patient is here with his wife.  Assessment/Plan:  Weight loss / Fatigue Likely due to C. difficile infection.  No red flags today and vitals were within normal limits.  I think he is probably dehydrated -has some ketones on his UA however glucose today is 117.  Recommended good oral hydration while dealing with C. difficile.  Will check CBC, C met, and TSH today.  He will continue oral vancomycin as per GI for his C. difficile infection.  Discussed reasons to return to care.  Follow-up with PCP if symptoms not improving with above.      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 He was admitted to the hospital a couple of months ago for severe dehydration and AKI. His wife reports that he has lost about 30 pounds over the last few months.  He is on vancomycin.  Tolerating this well.  Has several loose bowel movements per day.  Denies any watery diarrhea.  No abdominal pain.  No nausea or vomiting.  He has had decreased appetite over the last several weeks.  Is also had some increasing fatigue as well.  No reported chest pain or shortness of breath.  No fevers or chills.  ROS: Per HPI  PMH: He reports that he quit smoking about 53 years ago. His smoking use included cigarettes. He has a 5.00 pack-year smoking history. He has never used smokeless tobacco. He reports that he does not drink alcohol or use drugs.      Objective:  Physical Exam: BP 110/80   Pulse 90   Temp (!) 97.5 F (36.4 C)   Ht _0  (1.676 m)   Wt 236 lb (107 kg)   SpO2 99%   BMI 38.09 kg/m   Wt Readings from Last 3 Encounters:  06/13/19 236 lb (107 kg)  06/11/19 237 lb 2 oz (107.6 kg)  04/26/19 257 lb 6.4 oz (116.8 kg)  Gen: NAD, resting comfortably CV: Irregular with no murmurs appreciated Pulm: Normal work of breathing, clear to  auscultation bilaterally with no crackles, wheezes, or rhonchi GI: Normal bowel sounds present. Soft, Nontender, Nondistended. MSK: No edema, cyanosis, or clubbing noted Skin: Warm, dry Neuro: Grossly normal, moves all extremities Psych: Normal affect and thought content         Siarra Gilkerson M. [name], MD 06/13/2019 10:52 AM

## 2019-06-13 NOTE — Patient Instructions (Signed)
It was very nice to see you today!  We will check blood work today.  Please make sure that you are staying well-hydrated.  Please make sure that you are getting plenty of potassium.  Take care, Dr [name]  Please try these tips to maintain a healthy lifestyle:   Eat at least 3 REAL meals and 1-2 snacks per day.  Aim for no more than 5 hours between eating.  If you eat breakfast, please do so within one hour of getting up.    Obtain twice as many fruits/vegetables as protein or carbohydrate foods for both lunch and dinner. (Half of each meal should be fruits/vegetables, one quarter protein, and one quarter starchy carbs)   Cut down on sweet beverages. This includes juice, soda, and sweet tea.    Exercise at least 150 minutes every week.

## 2019-06-13 NOTE — Telephone Encounter (Signed)
Spoke with patient's granddaughter, Sharyn Lull.  Patient has had 30 pound weight loss since June.  Decreased appetite, but he is still eating.  Says he "just doesn't feel good".  Color is "off".  Looks pale and maybe slightly yellow.  Currently on vancomycin for C-diff.    No fever, chills, cough, upper respiratory symptoms.  No loss of taste or smell.  No COVID contacts.  She wants someone to "lay eyes on him and do blood work"  CB# [phone]

## 2019-06-13 NOTE — Telephone Encounter (Signed)
Discussed with Dr. Dimas Chyle, who is willing to see patient in office, and he has availability at that time. Patient has been switched over to his schedule.  Butch Penny -- please call patient and let them know that he can still be seen in the office and will be seeing Dr. [name] due to his complex medical issues. If he is too weak to walk or get in/out of car, needs to go to the ER and not come to the office.  Thank you.

## 2019-06-14 ENCOUNTER — Telehealth: Payer: Self-pay | Admitting: Physical Therapy

## 2019-06-14 NOTE — Telephone Encounter (Signed)
Patient grandaughter notified of update  and verbalized understanding.

## 2019-06-14 NOTE — Telephone Encounter (Signed)
Copied from [place] [id number]. Topic: General - Inquiry >> Jun 14, 2019  2:24 PM Richardo Priest, NT wrote: Reason for CRM: Patient's granddaughter called in stating she is wondering if patient is needing to take supplements to help with low potassium, as patient does not remember if he was told to do so. Please advise and call granddaughter back at [phone], as she is assisting him with all his medications.

## 2019-06-20 ENCOUNTER — Other Ambulatory Visit: Payer: Self-pay

## 2019-06-20 ENCOUNTER — Other Ambulatory Visit (INDEPENDENT_AMBULATORY_CARE_PROVIDER_SITE_OTHER): Payer: Medicare Other

## 2019-06-20 DIAGNOSIS — D72828 Other elevated white blood cell count: Secondary | ICD-10-CM

## 2019-06-21 LAB — CBC
HCT: 42.7 % (ref 39.0–52.0)
Hemoglobin: 14.2 g/dL (ref 13.0–17.0)
MCHC: 33.4 g/dL (ref 30.0–36.0)
MCV: 83.1 fl (ref 78.0–100.0)
Platelets: 225 10*3/uL (ref 150.0–400.0)
RBC: 5.14 Mil/uL (ref 4.22–5.81)
RDW: 15.4 % (ref 11.5–15.5)
WBC: 8.9 10*3/uL (ref 4.0–10.5)

## 2019-06-21 NOTE — Progress Notes (Signed)
Please inform patient of the following:  Blood counts have normalized - do not need to do any further testing at this point.  [name]. [name], MD 06/21/2019 1:08 PM

## 2019-06-24 ENCOUNTER — Other Ambulatory Visit: Payer: Self-pay | Admitting: Gastroenterology

## 2019-07-03 ENCOUNTER — Ambulatory Visit (INDEPENDENT_AMBULATORY_CARE_PROVIDER_SITE_OTHER): Payer: Medicare Other | Admitting: Internal Medicine

## 2019-07-03 ENCOUNTER — Other Ambulatory Visit: Payer: Self-pay

## 2019-07-03 ENCOUNTER — Encounter: Payer: Self-pay | Admitting: Internal Medicine

## 2019-07-03 VITALS — BP 148/90 | HR 101 | Ht 66.0 in | Wt 239.0 lb

## 2019-07-03 DIAGNOSIS — E785 Hyperlipidemia, unspecified: Secondary | ICD-10-CM

## 2019-07-03 DIAGNOSIS — E039 Hypothyroidism, unspecified: Secondary | ICD-10-CM

## 2019-07-03 DIAGNOSIS — I255 Ischemic cardiomyopathy: Secondary | ICD-10-CM | POA: Diagnosis not present

## 2019-07-03 DIAGNOSIS — E1151 Type 2 diabetes mellitus with diabetic peripheral angiopathy without gangrene: Secondary | ICD-10-CM

## 2019-07-03 MED ORDER — GLUCOSE BLOOD VI STRP: ORAL_STRIP | 3 refills | Status: DC

## 2019-07-03 NOTE — Progress Notes (Signed)
Patient ID: [name], male   DOB: September 30, 1946, 73 y.o.   MRN: [id number]  HPI: [name] is a 73 y.o.-year-old male, returning for DM2, dx 2004, non-insulin-dependent, uncontrolled, with complications (CAD, s/p MI). Last visit 4 months ago. He has [facility] as supplemental and [employer] for meds in 10/2015.  He was admitted with C. difficile colitis in 04/2019 after being on ABx for a dental infection.  He developed AKI, elevated white blood cell count.  The infection finally resolved, but he lost ~20 lbs in last 2 mo. he adjusted his metformin dose down to 50% of the dose.  Last hemoglobin A1c was: 02/27/2019: HbA1c calculated from fructosamine is better: 6.45%. 10/24/2018: HbA1c calculated from fructosamine is slightly higher than the one from 02/2018, at 6.6%. 02/13/2018: The HbA1c calculated from the fructosamine is excellent, at 6.3%! 10/16/2017: HbA1c calculated from fructosamine is: 7.3% (higher). 06/16/2017: HbA1c calculated from the fructosamine is stable, at 6.8%.  12/08/2016: HbA1c calculated from the fructosamine is better, at 6.86%. 08/10/2016: HbA1c calculated from  fructosamine is 7.88% 05/10/2016: HbA1c calculated from fructosamine is 7.6% 01/06/2016: HbA1c calculated from fructosamine is 6.6% 09/06/2016: HbA1c calculated from fructosamine is 7.0% 06/01/2015: HbA1c calculated from fructosamine is 7.0% 02/23/2015: HbA1c calculated from fructosamine is 6.88% 10/15/2014: HbA1c calculated from fructosamine is 7.17% Lab Results  Component Value Date   HGBA1C 6.9 (H) 04/26/2019   HGBA1C 7.7 (A) 10/24/2018   HGBA1C 8.8 06/16/2017  He started to change his eating habits since 03/2013.   He is on: - Glipizide 10 mg 2x a day - Metformin 1000 mg 2x a day >> 500 mg 2x a day (04/2019) - Januvia 100 mg daily  - Jardiance 10 mg in a.m. every other day due to increased urination >> moved every day 02/2019 He does not miss doses.  Pt checks his sugars 4x a day: - am: 112-159  >> 110-138 >> 110-142 >> 116-136 - 2h after breakfast: 142-151 >>  136-165 >> 143-162 - before lunch 144 >> 110-120s >> n/c  - before dinner: 113-138 >> 104-129 >> 102-127 - 2h after dinner: 136-155 >> 133-164 >> 133-156 He has hypoglycemia awareness in the 60s. Highest: 287 >> 170 >> 155 >> 165 >> 116.  His wife is a Radiographer, therapeutic >> he does eat sweets. He saw [name] (nutritionist).  -+ Mild CKD, last BUN/creatinine:  Lab Results  Component Value Date   BUN 8 06/13/2019   CREATININE 1.01 06/13/2019  On Entresto.  -+ HL:  last set of lipids: Lab Results  Component Value Date   CHOL 81 02/27/2019   HDL 49.20 02/27/2019   LDLCALC 1 02/27/2019   LDLDIRECT 18.0 06/13/2017   TRIG 152.0 (H) 02/27/2019   CHOLHDL 2 02/27/2019  He was in the Reveal Study x 4 years >> stopped. On Lipitor and omega-3 fatty acid.  - last eye exam was 10/2017: No DR ([name]).  He has mild cataracts.   -No numbness and tingling in his feet.  He has a history of OSA-compliant with CPAP, A. Fib-on Plavix and pradaxa, IBD, GERD, vitamin B12 deficiency - previously on im B12, also, obesity.  In 2019 she was also diagnosed with CHF -EF of 25 to 30%.  His Lasix was increased to twice a day(takes the second dose as needed). Cardiac cath >> no significant obstruction.  He was started on Entresto.    Hypothyroidism:  Pt is on levothyroxine 50 mcg daily, taken: - in am - fasting -  at least 30 min from b'fast - no Ca, Fe, MVI, PPIs - not on Biotin  Latest TSH was normal: Lab Results  Component Value Date   TSH 0.62 06/13/2019   Pt denies: - feeling nodules in neck - hoarseness - dysphagia - choking - SOB with lying down  ROS: Constitutional: no weight gain/no weight loss, no fatigue, no subjective hyperthermia, no subjective hypothermia Eyes: no blurry vision, no xerophthalmia ENT: no sore throat, + see HPI Cardiovascular: no CP/no SOB/no palpitations/no leg swelling Respiratory: no  cough/no SOB/no wheezing Gastrointestinal: no N/no V/no D/no C/no acid reflux Musculoskeletal: no muscle aches/no joint aches Skin: no rashes, no hair loss Neurological: no tremors/no numbness/no tingling/no dizziness  I reviewed pt's medications, allergies, PMH, social hx, family hx, and changes were documented in the history of present illness. Otherwise, unchanged from my initial visit note.  Past Medical History:  Diagnosis Date  . Atrial fibrillation ([place])    initial diagnoses 2012  . B12 deficiency    per patient previously taking shots  . C. difficile diarrhea   . CORONARY ARTERY DISEASE 04/12/2007   2 stents last in 2006.   Marland Kitchen DIABETES MELLITUS, TYPE II 04/16/2007  . Diverticulosis of colon (without mention of hemorrhage)   . HYPERLIPIDEMIA 04/16/2007   reveal study. not sure of medication  . HYPERTENSION 04/12/2007  . HYPOTHYROIDISM 04/12/2007  . Ischemic cardiomyopathy    cath 35-40%  . MYOCARDIAL INFARCTION, HX OF 04/12/2007  . OBESITY 06/16/2009  . SLEEP APNEA 04/12/2007   CPAP  . Stroke ([place])   . ULCERATIVE COLITIS, LEFT SIDED 11/23/2010   Past Surgical History:  Procedure Laterality Date  . CARDIAC CATHETERIZATION  10/2002   STENT. 2 stents Dr. Maurene Capes  . CARDIAC CATHETERIZATION N/A 10/28/2015   Procedure: Right/Left Heart Cath and Coronary Angiography;  Surgeon: Sherren Mocha, MD;  Location: [facility];  Service: Cardiovascular;  Laterality: N/A;  . CORONARY STENT PLACEMENT  2008   LAD   . RIGHT/LEFT HEART CATH AND CORONARY ANGIOGRAPHY N/A 06/01/2018   Procedure: RIGHT/LEFT HEART CATH AND CORONARY ANGIOGRAPHY;  Surgeon: Sherren Mocha, MD;  Location: [facility];  Service: Cardiovascular;  Laterality: N/A;  . TONSILLECTOMY     Social History   Socioeconomic History  . Marital status: Married    Spouse name: Not on file  . Number of children: 1  . Years of education: Not on file  . Highest education level: Not on file  Occupational History  . Occupation:  RETIRED    Employer: RETIRED  Social Needs  . Financial resource strain: Not on file  . Food insecurity    Worry: Not on file    Inability: Not on file  . Transportation needs    Medical: Not on file    Non-medical: Not on file  Tobacco Use  . Smoking status: Former Smoker    Packs/day: 1.00    Years: 5.00    Pack years: 5.00    Types: Cigarettes    Quit date: 04/13/1966    Years since quitting: 53.2  . Smokeless tobacco: Never Used  Substance and Sexual Activity  . Alcohol use: No    Alcohol/week: 0.0 standard drinks    Comment: no more beer  . Drug use: No  . Sexual activity: Not on file  Lifestyle  . Physical activity    Days per week: Not on file    Minutes per session: Not on file  . Stress: Not on file  Relationships  . Social Herbalist on phone: Not on file    Gets together: Not on file    Attends religious service: Not on file    Active member of club or organization: Not on file    Attends meetings of clubs or organizations: Not on file    Relationship status: Not on file  . Intimate partner violence    Fear of current or ex partner: Not on file    Emotionally abused: Not on file    Physically abused: Not on file    Forced sexual activity: Not on file  Other Topics Concern  . Not on file  Social History Narrative   Cardiorehab 3 days a week. 45 minutes to an hour-stationary bike.    GRANDDAUGHTER (MS. 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       Married for 37 years in 2015, married previously for 14 years. Daughter with first wife and 3 grandkids.    Lives alone with wife. Get to see grandkids a lot. New grandchild in middle of 90      Hobbies-yardwork, previously liked to hunt and fish, does some target shooting   Current Outpatient Medications on File Prior to Visit  Medication Sig Dispense Refill  . acetaminophen (TYLENOL) 500 MG  tablet Take 1,000 mg by mouth every 6 (six) hours as needed for headache (pain).    . APRISO 0.375 g 24 hr capsule TAKE 4 CAPSULES (1.5 GRAMS TOTAL) BY MOUTH DAILY 120 capsule 3  . atorvastatin (LIPITOR) 20 MG tablet TAKE ONE TABLET (20MG TOTAL) BY MOUTH DAILY (Patient taking differently: Take 20 mg by mouth daily with breakfast. ) 90 tablet 1  . carvedilol (COREG) 25 MG tablet TAKE ONE TABLET BY MOUTH TWICE A DAY (Patient taking differently: Take 25 mg by mouth 2 (two) times daily with a meal. ) 180 tablet 2  . clopidogrel (PLAVIX) 75 MG tablet Take 1 tablet (75 mg total) by mouth daily. (Patient taking differently: Take 75 mg by mouth daily with breakfast. ) 90 tablet 3  . Coenzyme Q10 (COQ10 PO) Take 1 capsule by mouth daily with supper.    . empagliflozin (JARDIANCE) 10 MG TABS tablet TAKE ONE (1) TABLET BY MOUTH EVERY DAY (Patient taking differently: Take 10 mg by mouth daily with breakfast. ) 90 tablet 3  . fish oil-omega-3 fatty acids 1000 MG capsule Take 1 g by mouth daily with supper.     . furosemide (LASIX) 40 MG tablet Take 1 tablet (40 mg total) by mouth daily. Take extra tablet once daily in the PM as needed for weight gain 3 lbs in 24 hrs or swelling. (Patient taking differently: Take 40 mg by mouth See admin instructions. Take one tablet (40 mg) by mouth daily with breakfast, may take an extra tablet once daily in the evening as needed for weight gain 3 lbs in 24 hrs or swelling.) 30 tablet 11  . glipiZIDE (GLUCOTROL) 10 MG tablet TAKE ONE TABLET BY MOUTH TWICE A DAY (Patient taking differently: Take 10 mg by mouth 2 (two) times daily before a meal. ) 180 tablet 2  . glucosamine-chondroitin 500-400 MG tablet Take 1 tablet by mouth daily as needed (knee flare up or pain).    Marland Kitchen glucose blood test strip Use as instructed to test 4 time daily 400 each 3  .  isosorbide mononitrate (IMDUR) 60 MG 24 hr tablet TAKE ONE (1) TABLET BY MOUTH EVERY DAY 90 tablet 3  . potassium chloride (K-DUR) 10 MEQ  tablet Take 1 tablet (10 mEq total) by mouth daily for 5 days. 5 tablet 0  . PRADAXA 150 MG CAPS capsule TAKE ONE (1) CAPSULE BY MOUTH TWICE A DAY. (EVERY 12 HOURS.) (EVERY 12 HOURS) (Patient taking differently: Take 150 mg by mouth 2 (two) times daily with a meal. ) 60 capsule 6  . saccharomyces boulardii (FLORASTOR) 250 MG capsule Take 1 capsule (250 mg total) by mouth 2 (two) times daily.    . sacubitril-valsartan (ENTRESTO) 97-103 MG Take 1 tablet by mouth 2 (two) times daily. (Patient taking differently: Take 1 tablet by mouth 2 (two) times daily with a meal. ) 60 tablet 6  . sitaGLIPtin (JANUVIA) 100 MG tablet TAKE ONE (1) TABLET BY MOUTH EVERY DAY (Patient taking differently: Take 100 mg by mouth daily with breakfast. ) 90 tablet 2  . spironolactone (ALDACTONE) 25 MG tablet TAKE ONE TABLET (25MG TOTAL) BY MOUTH DAILY (Patient taking differently: Take 25 mg by mouth daily with breakfast. ) 90 tablet 1  . SYNTHROID 50 MCG tablet TAKE 1 TABLET (50 MG TOTAL) BY MOUTH DAILY BEFORE BREAKFAST (Patient taking differently: Take 50 mcg by mouth daily before breakfast. ) 90 tablet 3  . vancomycin (VANCOCIN) 250 MG capsule One tablet by mouth four times a day x 14 days, reduce to one tablet by mouth twice daily x 7 days, reduce to one tablet by mouth once daily x 7 days, reduce to one tablet by mouth every other day x 14 days. Then stop. 85 capsule 0   No current facility-administered medications on file prior to visit.    Allergies  Allergen Reactions  . Clindamycin/Lincomycin     Developed C. difficile colitis 1 month after use   Family History  Problem Relation Age of Onset  . Heart attack Mother        mid 40s  . Heart disease Father        H/O CAD, CABG, VALVE SURGERY  . Hypertension Brother   . Obesity Brother   . Stomach cancer Maternal Aunt   . Stomach cancer Paternal Grandmother        ? colon     PE: BP (!) 148/90   Pulse (!) 101   Ht 5' 6"  (1.676 m)   Wt 239 lb (108.4 kg)    SpO2 99%   BMI 38.58 kg/m  Body mass index is 38.58 kg/m.  Wt Readings from Last 3 Encounters:  07/03/19 239 lb (108.4 kg)  06/13/19 236 lb (107 kg)  06/11/19 237 lb 2 oz (107.6 kg)   Constitutional: overweight, in NAD Eyes: PERRLA, EOMI, no exophthalmos ENT: moist mucous membranes, no thyromegaly, no cervical lymphadenopathy Cardiovascular: tachycardia, RR, No MRG, + B LE edema L>R Respiratory: CTA B Gastrointestinal: abdomen soft, NT, ND, BS+ Musculoskeletal: no deformities, strength intact in all 4 Skin: moist, warm, no rashes Neurological: no tremor with outstretched hands, DTR normal in all 4   ASSESSMENT: 1. DM2, non-insulin-dependent, uncontrolled, with complications - CAD, s/p MI 10/2006 - s/p stent - sees [name]  2. Obesity class 3 BMI Classification:  < 18.5 underweight   18.5-24.9 normal weight   25.0-29.9 overweight   30.0-34.9 class I obesity   35.0-39.9 class II obesity   ? 40.0 class III obesity   3. HL  4.  Hypothyroidism  PLAN:  1. Patient with longstanding, fairly well-controlled diabetes, on oral antidiabetic regimen.  At last visit I advised him to increase Jardiance, which he was taking every other day due to increased urination.  He is not taking it every day with good tolerance.  I also advised him to check with his insurance if weekly GLP-1 receptor agonist are covered for him. -At this visit, sugars are mostly at goal as reviewed from he is carefully kept log.  He does a great job checking his sugars 4 times a day. -We discussed at this visit about the possibility of increasing metformin dose to 1000 mg twice a day) since he now does not have diarrhea and kidney function has improved) and try to stop Januvia, or continue the current regimen until after the holidays.  He would like to stay on the same regimen for now.  At next visit, if sugars remain controlled, I plan to decrease his glipizide.  We may also be able to stop Januvia and  then. - I advised him to Patient Instructions  Please continue - Glipizide 10 mg 2x a day - Metformin 500 mg 2x a day - Januvia 100 mg daily  - Jardiance 10 mg before breakfast every day  Please return in 4 months with your sugar log.   - we reviewed together his latest HbA1c which has improved to 6.9%.  His fructosamine levels are usually more accurate for him, but due to the fact that the directly measured HbA1c is already at goal, we will not check his fructosamine level today - advised to check sugars at different times of the day - 4x a day, rotating check times - advised for yearly eye exams >> he is not  UTD, but has one scheduled - return to clinic in 4 months       2. Obesity class 3 -Lost almost 20 pounds in the last 2 months after his C. difficile colitis episode -Continue low-dose Jardiance which should also help with weight loss.  He does not have any increased urination from the daily dose.  3. HL -Reviewed latest lipid panel from 02/2019: He had an LDL of 1 (? if lab error, however, previous LDL has also been very low...).  Triglycerides slightly high Lab Results  Component Value Date   CHOL 81 02/27/2019   HDL 49.20 02/27/2019   LDLCALC 1 02/27/2019   LDLDIRECT 18.0 06/13/2017   TRIG 152.0 (H) 02/27/2019   CHOLHDL 2 02/27/2019  -Continues Lipitor and omega-3 fatty acids without side effects.  4.  Hypothyroidism - latest thyroid labs reviewed with pt >> normal earlier this month - he continues on LT4 50 mcg daily - pt feels good on this dose. - we discussed about taking the thyroid hormone every day, with water, >30 minutes before breakfast, separated by >4 hours from acid reflux medications, calcium, iron, multivitamins. Pt. is taking it correctly.   Philemon Kingdom, MD PhD [facility] Endocrinology

## 2019-07-03 NOTE — Patient Instructions (Addendum)
Please continue - Glipizide 10 mg 2x a day - Metformin 500 mg 2x a day - Januvia 100 mg daily  - Jardiance 10 mg before breakfast every day  Please return in 4 months with your sugar log.

## 2019-07-17 ENCOUNTER — Ambulatory Visit: Payer: Medicare Other

## 2019-07-18 ENCOUNTER — Ambulatory Visit (INDEPENDENT_AMBULATORY_CARE_PROVIDER_SITE_OTHER): Payer: Medicare Other

## 2019-07-18 ENCOUNTER — Encounter: Payer: Self-pay | Admitting: Family Medicine

## 2019-07-18 ENCOUNTER — Other Ambulatory Visit: Payer: Self-pay

## 2019-07-18 DIAGNOSIS — Z23 Encounter for immunization: Secondary | ICD-10-CM

## 2019-08-07 ENCOUNTER — Telehealth: Payer: Self-pay | Admitting: Gastroenterology

## 2019-08-07 DIAGNOSIS — A0472 Enterocolitis due to Clostridium difficile, not specified as recurrent: Secondary | ICD-10-CM

## 2019-08-07 MED ORDER — DIFICID 200 MG PO TABS: 200.0000 mg | ORAL_TABLET | Freq: Two times a day (BID) | ORAL | 0 refills | Status: DC

## 2019-08-07 MED FILL — DIFICID 200 MG TABLET: 200 | 10 days supply | Qty: 20 | Fill #0

## 2019-08-07 NOTE — Telephone Encounter (Signed)
Dificid 200 mg po bid for 10 days Low fiber, lactose free, fat modified, no raw fruit or vegetables until symptoms resolve ID referral

## 2019-08-07 NOTE — Telephone Encounter (Signed)
Patient completed the vancomycin taper on 10/13.  He is now having watery stools 5-8 a day, urgency.  He has tried vancomycin, dificid, then a one month vancomycin taper. Please advise

## 2019-08-07 NOTE — Telephone Encounter (Signed)
Patient's granddaughter notified of the results and recommendations rx sent to [facility] outpatient pharmacy She is aware they will be contacted directly by ID with an appt date and time.

## 2019-08-07 NOTE — Telephone Encounter (Signed)
Patient's daughter

## 2019-08-26 ENCOUNTER — Ambulatory Visit (INDEPENDENT_AMBULATORY_CARE_PROVIDER_SITE_OTHER): Payer: Medicare Other | Admitting: Family Medicine

## 2019-08-26 ENCOUNTER — Encounter: Payer: Self-pay | Admitting: Family Medicine

## 2019-08-26 ENCOUNTER — Ambulatory Visit (INDEPENDENT_AMBULATORY_CARE_PROVIDER_SITE_OTHER): Payer: Medicare Other

## 2019-08-26 ENCOUNTER — Other Ambulatory Visit: Payer: Self-pay

## 2019-08-26 VITALS — BP 154/78 | Temp 98.2°F | Ht 66.0 in | Wt 243.2 lb

## 2019-08-26 VITALS — BP 130/82 | HR 83 | Temp 98.2°F | Ht 66.0 in | Wt 243.2 lb

## 2019-08-26 DIAGNOSIS — Z0000 Encounter for general adult medical examination without abnormal findings: Secondary | ICD-10-CM | POA: Diagnosis not present

## 2019-08-26 DIAGNOSIS — I10 Essential (primary) hypertension: Secondary | ICD-10-CM | POA: Diagnosis not present

## 2019-08-26 DIAGNOSIS — I255 Ischemic cardiomyopathy: Secondary | ICD-10-CM | POA: Diagnosis not present

## 2019-08-26 DIAGNOSIS — R197 Diarrhea, unspecified: Secondary | ICD-10-CM

## 2019-08-26 DIAGNOSIS — I5022 Chronic systolic (congestive) heart failure: Secondary | ICD-10-CM

## 2019-08-26 DIAGNOSIS — K5100 Ulcerative (chronic) pancolitis without complications: Secondary | ICD-10-CM | POA: Diagnosis not present

## 2019-08-26 DIAGNOSIS — A0472 Enterocolitis due to Clostridium difficile, not specified as recurrent: Secondary | ICD-10-CM | POA: Diagnosis not present

## 2019-08-26 LAB — CBC WITH DIFFERENTIAL/PLATELET
Basophils Absolute: 0 10*3/uL (ref 0.0–0.1)
Basophils Relative: 0.5 % (ref 0.0–3.0)
Eosinophils Absolute: 0.1 10*3/uL (ref 0.0–0.7)
Eosinophils Relative: 1.6 % (ref 0.0–5.0)
HCT: 43.7 % (ref 39.0–52.0)
Hemoglobin: 14.3 g/dL (ref 13.0–17.0)
Lymphocytes Relative: 12.7 % (ref 12.0–46.0)
Lymphs Abs: 0.9 10*3/uL (ref 0.7–4.0)
MCHC: 32.8 g/dL (ref 30.0–36.0)
MCV: 82.9 fl (ref 78.0–100.0)
Monocytes Absolute: 0.6 10*3/uL (ref 0.1–1.0)
Monocytes Relative: 8.2 % (ref 3.0–12.0)
Neutro Abs: 5.5 10*3/uL (ref 1.4–7.7)
Neutrophils Relative %: 77 % (ref 43.0–77.0)
Platelets: 166 10*3/uL (ref 150.0–400.0)
RBC: 5.27 Mil/uL (ref 4.22–5.81)
RDW: 15.5 % (ref 11.5–15.5)
WBC: 7.1 10*3/uL (ref 4.0–10.5)

## 2019-08-26 LAB — COMPREHENSIVE METABOLIC PANEL
ALT: 7 U/L (ref 0–53)
AST: 12 U/L (ref 0–37)
Albumin: 3.9 g/dL (ref 3.5–5.2)
Alkaline Phosphatase: 66 U/L (ref 39–117)
BUN: 9 mg/dL (ref 6–23)
CO2: 31 mEq/L (ref 19–32)
Calcium: 9.2 mg/dL (ref 8.4–10.5)
Chloride: 104 mEq/L (ref 96–112)
Creatinine, Ser: 0.94 mg/dL (ref 0.40–1.50)
GFR: 78.54 mL/min (ref >60.00)
Glucose, Bld: 102 mg/dL — ABNORMAL HIGH (ref 70–99)
Potassium: 4.4 mEq/L (ref 3.5–5.1)
Sodium: 142 mEq/L (ref 135–145)
Total Bilirubin: 0.9 mg/dL (ref 0.2–1.2)
Total Protein: 6.4 g/dL (ref 6.0–8.3)

## 2019-08-26 NOTE — Progress Notes (Signed)
Phone [phone] In person visit   Subjective:   [name] is a 73 y.o. year old very pleasant male patient who presents for/with See problem oriented charting Chief Complaint  Patient presents with  . Follow-up  . Hypothyroidism  . Hypertension   ROS- some edema. No chest pain or shortness of breath. No headache or blurry vision.    Past Medical History-  Patient Active Problem List   Diagnosis Date Noted  . History of transient ischemic attack (TIA) 10/24/2015    Priority: High  . CHF (congestive heart failure), NYHA class II ([place]) 05/09/2014    Priority: High  . Ulcerative pancolitis without complication ([place]) 29/56/2130    Priority: High  . Atrial fibrillation ([place]) 12/08/2010    Priority: High  . Type II diabetes mellitus with peripheral circulatory disorder ([place]) 04/16/2007    Priority: High  . CAD (coronary artery disease), native coronary artery 04/12/2007    Priority: High  . Morbid obesity ([place]) 08/09/2011    Priority: Medium  . Hyperlipidemia 04/16/2007    Priority: Medium  . Hypothyroidism 04/12/2007    Priority: Medium  . Essential hypertension 04/12/2007    Priority: Medium  . OSA (obstructive sleep apnea) 04/12/2007    Priority: Medium  . Former smoker 08/11/2016    Priority: Low  . MCI (mild cognitive impairment) 01/19/2016    Priority: Low  . Primary osteoarthritis of left knee 02/09/2015    Priority: Low  . Allergic rhinitis 06/25/2012    Priority: Low  . GERD (gastroesophageal reflux disease) 08/09/2011    Priority: Low  . VITAMIN B12 DEFICIENCY 11/29/2010    Priority: Low  . Atherosclerotic heart disease of native coronary artery with other forms of angina pectoris ([place]) 04/26/2019  . C. difficile colitis 04/17/2019  . Dehydration 04/15/2019  . Diarrhea 04/15/2019  . ARF (acute renal failure) ([place]) 04/15/2019  . Chronic combined systolic and diastolic CHF (congestive heart failure) ([place]) 04/15/2019  . Hypokalemia 04/15/2019  .  AKI (acute kidney injury) ([place])   . Acute on chronic combined systolic and diastolic CHF (congestive heart failure) ([place]) 02/07/2018    Medications- reviewed and updated Current Outpatient Medications  Medication Sig Dispense Refill  . acetaminophen (TYLENOL) 500 MG tablet Take 1,000 mg by mouth every 6 (six) hours as needed for headache (pain).    . APRISO 0.375 g 24 hr capsule TAKE 4 CAPSULES (1.5 GRAMS TOTAL) BY MOUTH DAILY 120 capsule 3  . atorvastatin (LIPITOR) 20 MG tablet TAKE ONE TABLET (20MG TOTAL) BY MOUTH DAILY (Patient taking differently: Take 20 mg by mouth daily with breakfast. ) 90 tablet 1  . carvedilol (COREG) 25 MG tablet TAKE ONE TABLET BY MOUTH TWICE A DAY (Patient taking differently: Take 25 mg by mouth 2 (two) times daily with a meal. ) 180 tablet 2  . clopidogrel (PLAVIX) 75 MG tablet Take 1 tablet (75 mg total) by mouth daily. (Patient taking differently: Take 75 mg by mouth daily with breakfast. ) 90 tablet 3  . Coenzyme Q10 (COQ10 PO) Take 1 capsule by mouth daily with supper.    . empagliflozin (JARDIANCE) 10 MG TABS tablet TAKE ONE (1) TABLET BY MOUTH EVERY DAY (Patient taking differently: Take 10 mg by mouth daily with breakfast. ) 90 tablet 3  . fidaxomicin (DIFICID) 200 MG TABS tablet Take 1 tablet (200 mg total) by mouth 2 (two) times daily. 20 tablet 0  . fish oil-omega-3 fatty acids 1000 MG capsule Take 1 g by mouth  daily with supper.     Marland Kitchen glipiZIDE (GLUCOTROL) 10 MG tablet TAKE ONE TABLET BY MOUTH TWICE A DAY (Patient taking differently: Take 10 mg by mouth 2 (two) times daily before a meal. ) 180 tablet 2  . glucosamine-chondroitin 500-400 MG tablet Take 1 tablet by mouth daily as needed (knee flare up or pain).    Marland Kitchen glucose blood test strip Use as instructed to test 4 time daily 400 each 3  . isosorbide mononitrate (IMDUR) 60 MG 24 hr tablet TAKE ONE (1) TABLET BY MOUTH EVERY DAY 90 tablet 3  . PRADAXA 150 MG CAPS capsule TAKE ONE (1) CAPSULE BY MOUTH TWICE A  DAY. (EVERY 12 HOURS.) (EVERY 12 HOURS) (Patient taking differently: Take 150 mg by mouth 2 (two) times daily with a meal. ) 60 capsule 6  . saccharomyces boulardii (FLORASTOR) 250 MG capsule Take 1 capsule (250 mg total) by mouth 2 (two) times daily.    . sacubitril-valsartan (ENTRESTO) 97-103 MG Take 1 tablet by mouth 2 (two) times daily. (Patient taking differently: Take 1 tablet by mouth 2 (two) times daily with a meal. ) 60 tablet 6  . sitaGLIPtin (JANUVIA) 100 MG tablet TAKE ONE (1) TABLET BY MOUTH EVERY DAY (Patient taking differently: Take 100 mg by mouth daily with breakfast. ) 90 tablet 2  . spironolactone (ALDACTONE) 25 MG tablet TAKE ONE TABLET (25MG TOTAL) BY MOUTH DAILY (Patient taking differently: Take 25 mg by mouth daily with breakfast. ) 90 tablet 1  . SYNTHROID 50 MCG tablet TAKE 1 TABLET (50 MG TOTAL) BY MOUTH DAILY BEFORE BREAKFAST (Patient taking differently: Take 50 mcg by mouth daily before breakfast. ) 90 tablet 3  . furosemide (LASIX) 40 MG tablet Take 1 tablet (40 mg total) by mouth daily. Take extra tablet once daily in the PM as needed for weight gain 3 lbs in 24 hrs or swelling. (Patient taking differently: Take 40 mg by mouth See admin instructions. Take one tablet (40 mg) by mouth daily with breakfast, may take an extra tablet once daily in the evening as needed for weight gain 3 lbs in 24 hrs or swelling.) 30 tablet 11   No current facility-administered medications for this visit.      Objective:  BP 130/82   Pulse 83   Temp 98.2 F (36.8 C) (Temporal)   Ht 5' 6"  (1.676 m)   Wt 243 lb 3.2 oz (110.3 kg)   SpO2 100%   BMI 39.25 kg/m  Gen: NAD, resting comfortably CV: RRR no murmurs rubs or gallops Lungs: CTAB no crackles, wheeze, rhonchi Abdomen: soft/nontender/nondistended/normal bowel sounds. Ext: 1+ edema Skin: warm, dry    Assessment and Plan   #C. difficile/diarrhea S:Original episode started after antibiotic with dental work. Had initial 2 week  vancomycin course- then later prolonged taper in early September. patient took long taper of vancomycin and when he came off had recurrence of diarrhea. Was treated with dificid for 10 days. He noted complete resolution by the end of course and for a few days after but in last few    To 3 bowel movements a day right now and looser than baseline-typically would have about 2 bowel movements a day A/P: Patient asked that I restart Dificid for him-we opted since symptoms are not clear-cut to at least use 1 marker of infection-GDH or toxin a or B and treat only if 1 of these is positive-if one positive could do additional testing but I would be willing to go  ahead and start Dificid again.  He is pending infectious disease follow-up as advised by Dr. Fuller Plan  #hypertension/CHF S: Compliant with Coreg 25 mg twice a day, Imdur 60 mg, Entresto, spironolactone 25 mg.  Patient's weight has gone up some-he would like to work back down to about 220.  He has Lasix available as needed and states he needs to take this as he had a beach trip and had some increased salt in his diet A/P:   Stable blood pressure. Continue current medications.   Patient does have CHF history-spironolactone should be beneficial for diuresis-also encouraged him to take Lasix given increased swelling after increase salt intake.  #Ulcerative colitis S: Followed by GI- compliant with mesalamine  A/P:  Stable-I do not think symptoms represent ulcerative colitis flare. Continue current medications. Is having some diarrhea- see c. Diff discussion . He is on florastor-we are going to rule out recurrence of C. difficile  Recommended follow up: As needed for acute concerns-every 6 months otherwise would be reasonable Future Appointments  Date Time Provider [place]  09/11/2019 11:10 AM Ladene Artist, MD [facility]  09/25/2019  3:00 PM Carlyle Basques, MD [facility]  11/06/2019  9:30 AM Philemon Kingdom, MD [facility] None    Lab/Order associations:   ICD-10-CM   1. Diarrhea, unspecified type  R19.7 C. difficile GDH and Toxin A/B    CBC with Differential    Comprehensive metabolic panel  2. C. difficile colitis  A04.72   3. Essential hypertension  I10   4. Chronic systolic congestive heart failure, NYHA class 2 (HCC)  I50.22   5. Ulcerative pancolitis without complication ([place])  D32.67    Return precautions advised.  Garret Reddish, MD

## 2019-08-26 NOTE — Patient Instructions (Addendum)
Please stop by lab and pick up stool collection kit for C. Diff testing- bring back by as soon as you can and if not clearly negative then we will go ahead and treat again.   Please stop by lab before you go If you do not have mychart- we will call you about results within 5 business days of [place] receiving them.  If you have mychart- we will send your results within 3 business days of [place] receiving them.  If abnormal or we want to clarify a result, we will call or mychart you to make sure you receive the message.  If you have questions or concerns or don't hear within 5-7 days, please send [place] a message or call us.

## 2019-08-26 NOTE — Progress Notes (Signed)
I have reviewed and agree with note, evaluation, plan.  See my separate note-blood pressure was improved at our visit and we opted to do C. difficile testing before deciding on treatment  Garret Reddish, MD

## 2019-08-26 NOTE — Patient Instructions (Addendum)
[name] , Thank you for taking time to come for your Medicare Wellness Visit. I appreciate your ongoing commitment to your health goals. Please review the following plan we discussed and let me know if I can assist you in the future.   Screening recommendations/referrals: Colorectal Screening: up to date; last colonoscopy 12/18/10  Vision and Dental Exams: Recommended annual ophthalmology exams for early detection of glaucoma and other disorders of the eye Recommended annual dental exams for proper oral hygiene  Diabetic Exams: Diabetic Eye Exam: recommended yearly; up to date Diabetic Foot Exam: recommended yearly; up to date  Vaccinations: Influenza vaccine: completed 07/18/19 Pneumococcal vaccine: p to date; last 02/09/15 Tdap vaccine: up to date; last /14/11 ; Shingles vaccine: Please call your insurance company to determine your out of pocket expense for the Shingrix vaccine. You may receive this vaccine at your local pharmacy.  Advanced directives: Please bring a copy of your POA (Power of Attorney) and/or Living Will to your next appointment.  Goals: Recommend to drink at least 6-8 8oz glasses of water per day and consume a balanced diet rich in fresh fruits and vegetables.   Next appointment: Please schedule your Annual Wellness Visit with your Nurse Health Advisor in one year.  Preventive Care 21 Years and Older, Male Preventive care refers to lifestyle choices and visits with your health care provider that can promote health and wellness. What does preventive care include?  A yearly physical exam. This is also called an annual well check.  Dental exams once or twice a year.  Routine eye exams. Ask your health care provider how often you should have your eyes checked.  Personal lifestyle choices, including:  Daily care of your teeth and gums.  Regular physical activity.  Eating a healthy diet.  Avoiding tobacco and drug use.  Limiting alcohol use.  Practicing  safe sex.  Taking low doses of aspirin every day if recommended by your health care provider..  Taking vitamin and mineral supplements as recommended by your health care provider. What happens during an annual well check? The services and screenings done by your health care provider during your annual well check will depend on your age, overall health, lifestyle risk factors, and family history of disease. Counseling  Your health care provider may ask you questions about your:  Alcohol use.  Tobacco use.  Drug use.  Emotional well-being.  Home and relationship well-being.  Sexual activity.  Eating habits.  History of falls.  Memory and ability to understand (cognition).  Work and work Statistician. Screening  You may have the following tests or measurements:  Height, weight, and BMI.  Blood pressure.  Lipid and cholesterol levels. These may be checked every 5 years, or more frequently if you are over 55 years old.  Skin check.  Lung cancer screening. You may have this screening every year starting at age 65 if you have a 30-pack-year history of smoking and currently smoke or have quit within the past 15 years.  Fecal occult blood test (FOBT) of the stool. You may have this test every year starting at age 48.  Flexible sigmoidoscopy or colonoscopy. You may have a sigmoidoscopy every 5 years or a colonoscopy every 10 years starting at age 52.  Prostate cancer screening. Recommendations will vary depending on your family history and other risks.  Hepatitis C blood test.  Hepatitis B blood test.  Sexually transmitted disease (STD) testing.  Diabetes screening. This is done by checking your blood sugar (glucose) after  you have not eaten for a while (fasting). You may have this done every 1-3 years.  Abdominal aortic aneurysm (AAA) screening. You may need this if you are a current or former smoker.  Osteoporosis. You may be screened starting at age 35 if you are at  high risk. Talk with your health care provider about your test results, treatment options, and if necessary, the need for more tests. Vaccines  Your health care provider may recommend certain vaccines, such as:  Influenza vaccine. This is recommended every year.  Tetanus, diphtheria, and acellular pertussis (Tdap, Td) vaccine. You may need a Td booster every 10 years.  Zoster vaccine. You may need this after age 25.  Pneumococcal 13-valent conjugate (PCV13) vaccine. One dose is recommended after age 5.  Pneumococcal polysaccharide (PPSV23) vaccine. One dose is recommended after age 73. Talk to your health care provider about which screenings and vaccines you need and how often you need them. This information is not intended to replace advice given to you by your health care provider. Make sure you discuss any questions you have with your health care provider. Document Released: 10/23/2015 Document Revised: 06/15/2016 Document Reviewed: 07/28/2015 Elsevier Interactive Patient Education  2017 [employer] Prevention in the Home Falls can cause injuries. They can happen to people of all ages. There are many things you can do to make your home safe and to help prevent falls. What can I do on the outside of my home?  Regularly fix the edges of walkways and driveways and fix any cracks.  Remove anything that might make you trip as you walk through a door, such as a raised step or threshold.  Trim any bushes or trees on the path to your home.  Use bright outdoor lighting.  Clear any walking paths of anything that might make someone trip, such as rocks or tools.  Regularly check to see if handrails are loose or broken. Make sure that both sides of any steps have handrails.  Any raised decks and porches should have guardrails on the edges.  Have any leaves, snow, or ice cleared regularly.  Use sand or salt on walking paths during winter.  Clean up any spills in your garage  right away. This includes oil or grease spills. What can I do in the bathroom?  Use night lights.  Install grab bars by the toilet and in the tub and shower. Do not use towel bars as grab bars.  Use non-skid mats or decals in the tub or shower.  If you need to sit down in the shower, use a plastic, non-slip stool.  Keep the floor dry. Clean up any water that spills on the floor as soon as it happens.  Remove soap buildup in the tub or shower regularly.  Attach bath mats securely with double-sided non-slip rug tape.  Do not have throw rugs and other things on the floor that can make you trip. What can I do in the bedroom?  Use night lights.  Make sure that you have a light by your bed that is easy to reach.  Do not use any sheets or blankets that are too big for your bed. They should not hang down onto the floor.  Have a firm chair that has side arms. You can use this for support while you get dressed.  Do not have throw rugs and other things on the floor that can make you trip. What can I do in the kitchen?  Clean  up any spills right away.  Avoid walking on wet floors.  Keep items that you use a lot in easy-to-reach places.  If you need to reach something above you, use a strong step stool that has a grab bar.  Keep electrical cords out of the way.  Do not use floor polish or wax that makes floors slippery. If you must use wax, use non-skid floor wax.  Do not have throw rugs and other things on the floor that can make you trip. What can I do with my stairs?  Do not leave any items on the stairs.  Make sure that there are handrails on both sides of the stairs and use them. Fix handrails that are broken or loose. Make sure that handrails are as long as the stairways.  Check any carpeting to make sure that it is firmly attached to the stairs. Fix any carpet that is loose or worn.  Avoid having throw rugs at the top or bottom of the stairs. If you do have throw rugs,  attach them to the floor with carpet tape.  Make sure that you have a light switch at the top of the stairs and the bottom of the stairs. If you do not have them, ask someone to add them for you. What else can I do to help prevent falls?  Wear shoes that:  Do not have high heels.  Have rubber bottoms.  Are comfortable and fit you well.  Are closed at the toe. Do not wear sandals.  If you use a stepladder:  Make sure that it is fully opened. Do not climb a closed stepladder.  Make sure that both sides of the stepladder are locked into place.  Ask someone to hold it for you, if possible.  Clearly mark and make sure that you can see:  Any grab bars or handrails.  First and last steps.  Where the edge of each step is.  Use tools that help you move around (mobility aids) if they are needed. These include:  Canes.  Walkers.  Scooters.  Crutches.  Turn on the lights when you go into a dark area. Replace any light bulbs as soon as they burn out.  Set up your furniture so you have a clear path. Avoid moving your furniture around.  If any of your floors are uneven, fix them.  If there are any pets around you, be aware of where they are.  Review your medicines with your doctor. Some medicines can make you feel dizzy. This can increase your chance of falling. Ask your doctor what other things that you can do to help prevent falls. This information is not intended to replace advice given to you by your health care provider. Make sure you discuss any questions you have with your health care provider. Document Released: 07/23/2009 Document Revised: 03/03/2016 Document Reviewed: 10/31/2014 Elsevier Interactive Patient Education  2017 [employer].

## 2019-08-26 NOTE — Progress Notes (Signed)
Subjective:   [name] is a 73 y.o. male who presents for Medicare Annual/Subsequent preventive examination.  Review of Systems:   Cardiac Risk Factors include: advanced age (>40mn, >>56women);male gender;hypertension;dyslipidemia;diabetes mellitus    Objective:    Vitals: BP (!) 154/78 (BP Location: Left Arm, Patient Position: Sitting, Cuff Size: Large)    Temp 98.2 F (36.8 C) (Temporal)    Ht 5' 6"  (1.676 m)    Wt 243 lb 3.2 oz (110.3 kg)    BMI 39.25 kg/m   Body mass index is 39.25 kg/m.  Advanced Directives 08/26/2019 04/15/2019 06/01/2018 01/25/2018 10/25/2015  Does Patient Have a Medical Advance Directive? Yes No Yes No No  Type of Advance Directive Living will;Healthcare Power [place] - -  Does patient want to make changes to medical advance directive? No - Patient declined - No - Patient declined - -  Copy of [place]? No - copy requested - No - copy requested - -  Would patient like information on creating a medical advance directive? - No - Patient declined - - No - patient declined information    Tobacco Social History   Tobacco Use  Smoking Status Former Smoker   Packs/day: 1.00   Years: 5.00   Pack years: 5.00   Types: Cigarettes   Quit date: 04/13/1966   Years since quitting: 53.4  Smokeless Tobacco Never Used     Counseling given: Not Answered   Clinical Intake:  Pre-visit preparation completed: Yes  Pain : No/denies pain  Diabetes: Yes CBG done?: No Did pt. bring in CBG monitor from home?: No  How often do you need to have someone help you when you read instructions, pamphlets, or other written materials from your doctor or pharmacy?: 1 - Never  Interpreter Needed?: No  Information entered by :: CDenman GeorgeLPN  Past Medical History:  Diagnosis Date   [place]rial [place]    initial diagnoses 2012   B12 deficiency    per patient previously  taking shots   C. difficile diarrhea    CORONARY ARTERY DISEASE 04/12/2007   2 stents last in 2006.    DIABETES MELLITUS, TYPE II 04/16/2007   Diverticulosis of colon (without mention of hemorrhage)    HYPERLIPIDEMIA 04/16/2007   reveal study. not sure of medication   HYPERTENSION 04/12/2007   HYPOTHYROIDISM 04/12/2007   Ischemic cardiomyopathy    cath 35-40%   MYOCARDIAL INFARCTION, HX OF 04/12/2007   OBESITY 06/16/2009   SLEEP APNEA 04/12/2007   CPAP   [place]    ULCERATIVE COLITIS, LEFT SIDED 11/23/2010   Past Surgical History:  Procedure Laterality Date   CARDIAC CATHETERIZATION  10/2002   STENT. 2 stents Dr. BMaurene Capes  CARDIAC CATHETERIZATION N/A 10/28/2015   Procedure: Right/Left Heart Cath and Coronary Angiography;  Surgeon: MSherren Mocha MD;  [facility];  Service: Cardiovascular;  Laterality: N/A;   CORONARY STENT PLACEMENT  2008   LAD    RIGHT/LEFT HEART CATH AND CORONARY ANGIOGRAPHY N/A 06/01/2018   Procedure: RIGHT/LEFT HEART CATH AND CORONARY ANGIOGRAPHY;  Surgeon: CSherren Mocha MD;  [facility];  Service: Cardiovascular;  Laterality: N/A;   TONSILLECTOMY     Family History  Problem Relation Age of Onset   Heart attack Mother        mid 772s  Heart disease Father        H/O CAD,  CABG, VALVE SURGERY   Hypertension Brother    Obesity Brother    Stomach cancer Maternal Aunt    Stomach cancer Paternal Grandmother        ? colon    Social History   Socioeconomic History   Marital status: Married    Spouse name: Not on file   Number of children: 1   Years of education: Not on file   Highest education level: Not on file  Occupational History   Occupation: RETIRED    Employer: RETIRED  Social Designer, fashion/clothing strain: Not on file   Food insecurity    Worry: Not on file    Inability: Not on file   Transportation needs    Medical: Not on file    Non-medical: Not on file  Tobacco Use    Smoking status: Former Smoker    Packs/day: 1.00    Years: 5.00    Pack years: 5.00    Types: Cigarettes    Quit date: 04/13/1966    Years since quitting: 53.4   Smokeless tobacco: Never Used  Substance and Sexual Activity   Alcohol use: No    Alcohol/week: 0.0 standard drinks    Comment: no more beer   Drug use: No   Sexual activity: Not on file  Lifestyle   Physical activity    Days per week: Not on file    Minutes per session: Not on file   Stress: Not on file  Relationships   Social connections    Talks on phone: Not on file    Gets together: Not on file    Attends religious service: Not on file    Active member of club or organization: Not on file    Attends meetings of clubs or organizations: Not on file    Relationship status: Not on file  Other Topics Concern   Not on file  Social History Narrative   Cardiorehab 3 days a week. 45 minutes to an hour-stationary bike.    GRANDDAUGHTER (MS. 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       Married for 37 years in 2015, married previously for 14 years. Daughter with first wife and 3 grandkids.    Lives alone with wife. Get to see grandkids a lot. New grandchild in middle of 57      Hobbies-yardwork, previously liked to hunt and fish, does some target shooting    Outpatient Encounter Medications as of 08/26/2019  Medication Sig   acetaminophen (TYLENOL) 500 MG tablet Take 1,000 mg by mouth every 6 (six) hours as needed for headache (pain).   APRISO 0.375 g 24 hr capsule TAKE 4 CAPSULES (1.5 GRAMS TOTAL) BY MOUTH DAILY   atorvastatin (LIPITOR) 20 MG tablet TAKE ONE TABLET (20MG TOTAL) BY MOUTH DAILY (Patient taking differently: Take 20 mg by mouth daily with breakfast. )   carvedilol (COREG) 25 MG tablet TAKE ONE TABLET BY MOUTH TWICE A DAY (Patient taking differently: Take 25 mg by mouth 2 (two) times daily with  a meal. )   clopidogrel (PLAVIX) 75 MG tablet Take 1 tablet (75 mg total) by mouth daily. (Patient taking differently: Take 75 mg by mouth daily with breakfast. )   Coenzyme Q10 (COQ10 PO) Take 1 capsule by mouth daily with supper.   empagliflozin (JARDIANCE) 10 MG TABS tablet TAKE ONE (1)  TABLET BY MOUTH EVERY DAY (Patient taking differently: Take 10 mg by mouth daily with breakfast. )   fidaxomicin (DIFICID) 200 MG TABS tablet Take 1 tablet (200 mg total) by mouth 2 (two) times daily.   fish oil-omega-3 fatty acids 1000 MG capsule Take 1 g by mouth daily with supper.    glipiZIDE (GLUCOTROL) 10 MG tablet TAKE ONE TABLET BY MOUTH TWICE A DAY (Patient taking differently: Take 10 mg by mouth 2 (two) times daily before a meal. )   glucosamine-chondroitin 500-400 MG tablet Take 1 tablet by mouth daily as needed (knee flare up or pain).   glucose blood test strip Use as instructed to test 4 time daily   isosorbide mononitrate (IMDUR) 60 MG 24 hr tablet TAKE ONE (1) TABLET BY MOUTH EVERY DAY   PRADAXA 150 MG CAPS capsule TAKE ONE (1) CAPSULE BY MOUTH TWICE A DAY. (EVERY 12 HOURS.) (EVERY 12 HOURS) (Patient taking differently: Take 150 mg by mouth 2 (two) times daily with a meal. )   saccharomyces boulardii (FLORASTOR) 250 MG capsule Take 1 capsule (250 mg total) by mouth 2 (two) times daily.   sacubitril-valsartan (ENTRESTO) 97-103 MG Take 1 tablet by mouth 2 (two) times daily. (Patient taking differently: Take 1 tablet by mouth 2 (two) times daily with a meal. )   sitaGLIPtin (JANUVIA) 100 MG tablet TAKE ONE (1) TABLET BY MOUTH EVERY DAY (Patient taking differently: Take 100 mg by mouth daily with breakfast. )   spironolactone (ALDACTONE) 25 MG tablet TAKE ONE TABLET (25MG TOTAL) BY MOUTH DAILY (Patient taking differently: Take 25 mg by mouth daily with breakfast. )   SYNTHROID 50 MCG tablet TAKE 1 TABLET (50 MG TOTAL) BY MOUTH DAILY BEFORE BREAKFAST (Patient taking differently: Take 50 mcg  by mouth daily before breakfast. )   vancomycin (VANCOCIN) 250 MG capsule One tablet by mouth four times a day x 14 days, reduce to one tablet by mouth twice daily x 7 days, reduce to one tablet by mouth once daily x 7 days, reduce to one tablet by mouth every other day x 14 days. Then stop.   furosemide (LASIX) 40 MG tablet Take 1 tablet (40 mg total) by mouth daily. Take extra tablet once daily in the PM as needed for weight gain 3 lbs in 24 hrs or swelling. (Patient taking differently: Take 40 mg by mouth See admin instructions. Take one tablet (40 mg) by mouth daily with breakfast, may take an extra tablet once daily in the evening as needed for weight gain 3 lbs in 24 hrs or swelling.)   potassium chloride (K-DUR) 10 MEQ tablet Take 1 tablet (10 mEq total) by mouth daily for 5 days.   No facility-administered encounter medications on file as of 08/26/2019.     Activities of Daily Living In your present state of health, do you have any difficulty performing the following activities: 08/26/2019 04/16/2019  Hearing? N N  Vision? N N  Difficulty concentrating or making decisions? N N  Walking or climbing stairs? N Y  Dressing or bathing? N N  Doing errands, shopping? N N  Preparing Food and eating ? N -  Using the Toilet? N -  In the past six months, have you accidently leaked urine? N -  Do you have problems with loss of bowel control? N -  Managing your Medications? N -  Managing your Finances? N -  Housekeeping or managing your Housekeeping? N -  Some recent data might be hidden  Patient Care Team: Marin Olp, MD as PCP - General (Family Medicine) Sherren Mocha, MD as PCP - Cardiology (Cardiology) Rutherford Guys, MD as Consulting Physician (Ophthalmology) Philemon Kingdom, MD as Consulting Physician (Endocrinology) Ladene Artist, MD as Consulting Physician (Gastroenterology)   Assessment:   This is a routine wellness examination for [facility].  Exercise Activities  and Dietary recommendations Current Exercise Habits: The patient does not participate in regular exercise at present  Goals     Weight (lb) < 250 lb (113.4 kg)     Continue to watch when and how much you eat. Your exercise routine is good Stay active through the week        Fall Risk Fall Risk  08/26/2019 02/28/2019 10/30/2018 05/28/2018 01/25/2018  Falls in the past year? 0 0 0 No No  Number falls in past yr: - 0 - - -  Injury with Fall? 0 0 - - -  Risk for fall due to : - - Impaired mobility;Impaired balance/gait - -  Follow up Falls evaluation completed;Education provided;Falls prevention discussed - - - -   Is the patient's home free of loose throw rugs in walkways, pet beds, electrical cords, etc?   yes      Grab bars in the bathroom? yes      Handrails on the stairs?   yes      Adequate lighting?   yes  Timed Get Up and Go Performed: completed and within normal timeframe; no gait abnormalities noted   Depression Screen PHQ 2/9 Scores 08/26/2019 02/28/2019 05/28/2018 01/25/2018  PHQ - 2 Score 0 0 0 0    Cognitive Function- no cognitive concerns noted  MMSE - Mini Mental State Exam 01/25/2018  Not completed: (No Data)     6CIT Screen 08/26/2019  What Year? 0 points  What month? 0 points  What time? 0 points  Count back from 20 0 points  Months in reverse 0 points  Repeat phrase 0 points  Total Score 0    Immunization History  Administered Date(s) Administered   Fluad Quad(high Dose 65+) 07/18/2019   Influenza Split 07/06/2011, 06/25/2012   Influenza Whole 07/14/2008, 08/03/2009, 08/24/2010   Influenza, High Dose Seasonal PF 08/10/2016, 06/13/2017, 06/20/2018   Influenza,inj,Quad PF,6+ Mos 06/03/2013, 07/11/2014, 06/29/2015   Pneumococcal Conjugate-13 02/09/2015   Pneumococcal Polysaccharide-23 07/06/2011   Td 09/22/2010   Zoster 07/19/2011    Qualifies for Shingles Vaccine? Discussed and patient will check with pharmacy for coverage.  Patient  education handout provided   Screening Tests Health Maintenance  Topic Date Due   Hepatitis C Screening  10/14/2098 (Originally [birth year])   HEMOGLOBIN A1C  10/27/2019   OPHTHALMOLOGY EXAM  11/09/2019   FOOT EXAM  04/25/2020   TETANUS/TDAP  09/22/2020   COLONOSCOPY  12/17/2020   INFLUENZA VACCINE  Completed   PNA vac Low Risk Adult  Completed   Cancer Screenings: Lung: Low Dose CT Chest recommended if Age 91-80 years, 30 pack-year currently smoking OR have quit w/in 15years. Patient does not qualify. Colorectal: colonoscopy 12/18/10; followed by Dr. Fuller Plan     Plan:  I have personally reviewed and addressed the Medicare Annual Wellness questionnaire and have noted the following in the patients chart:  A. Medical and social history B. Use of alcohol, tobacco or illicit drugs  C. Current medications and supplements D. Functional ability and status E.  Nutritional status F.  Physical activity G. Advance directives H. List of other physicians I.  Hospitalizations, surgeries, and ER visits  in previous 12 months J.  [place] such as hearing and vision if needed, cognitive and depression L. Referrals, records requested, and appointments- none    In addition, I have reviewed and discussed with patient certain preventive protocols, quality metrics, and best practice recommendations. A written personalized care plan for preventive services as well as general preventive health recommendations were provided to patient.   Signed,  Denman George, LPN  Nurse Health Advisor   Nurse Notes: Patient would like to discuss with provider if refill of Vancomycin would be appropriate

## 2019-08-27 ENCOUNTER — Other Ambulatory Visit: Payer: Medicare Other

## 2019-08-27 DIAGNOSIS — R197 Diarrhea, unspecified: Secondary | ICD-10-CM

## 2019-08-28 ENCOUNTER — Other Ambulatory Visit: Payer: Self-pay | Admitting: Internal Medicine

## 2019-08-28 LAB — C. DIFFICILE GDH AND TOXIN A/B
GDH ANTIGEN: NOT DETECTED
MICRO NUMBER:: 1109793
SPECIMEN QUALITY:: ADEQUATE
TOXIN A AND B: NOT DETECTED

## 2019-08-29 ENCOUNTER — Other Ambulatory Visit: Payer: Self-pay

## 2019-08-29 ENCOUNTER — Other Ambulatory Visit: Payer: Self-pay | Admitting: Cardiovascular Disease

## 2019-08-29 MED ORDER — METFORMIN HCL 500 MG PO TABS: 500.0000 mg | ORAL_TABLET | Freq: Two times a day (BID) | ORAL | 3 refills | Status: DC

## 2019-08-30 NOTE — Telephone Encounter (Signed)
This is a CHF pt 

## 2019-09-02 ENCOUNTER — Other Ambulatory Visit: Payer: Self-pay

## 2019-09-02 ENCOUNTER — Encounter: Payer: Self-pay | Admitting: Nurse Practitioner

## 2019-09-02 ENCOUNTER — Ambulatory Visit (INDEPENDENT_AMBULATORY_CARE_PROVIDER_SITE_OTHER): Payer: Medicare Other | Admitting: Nurse Practitioner

## 2019-09-02 VITALS — BP 182/106 | HR 84 | Temp 97.3°F | Ht 68.0 in | Wt 236.4 lb

## 2019-09-02 DIAGNOSIS — R197 Diarrhea, unspecified: Secondary | ICD-10-CM

## 2019-09-02 DIAGNOSIS — A0472 Enterocolitis due to Clostridium difficile, not specified as recurrent: Secondary | ICD-10-CM | POA: Diagnosis not present

## 2019-09-02 DIAGNOSIS — I255 Ischemic cardiomyopathy: Secondary | ICD-10-CM

## 2019-09-02 MED ORDER — DICYCLOMINE HCL 10 MG PO CAPS: 10.0000 mg | ORAL_CAPSULE | Freq: Four times a day (QID) | ORAL | 0 refills | Status: DC | PRN

## 2019-09-02 NOTE — Patient Instructions (Addendum)
Your health issues we discussed today were:   Diarrhea with previous C. difficile: 1. I am glad your most recent C. difficile test was negative! 2. Have your labs completed when you are able to 3. Continue taking your probiotics 4. Continue to avoid dairy and high-fiber foods 5. I sent in a prescription for Bentyl (dicyclomine) 10 mg.  Take this up to 4 times a day as needed for diarrhea 6. You can also use Imodium as needed for diarrhea 7. Keep your infectious disease appointment on 09/25/2019 8. Call us if you have any worsening or severe symptoms  Overall I recommend:  1. Continue your other medications 2. Return for follow-up in 2 months 3. Call us if you have any questions or concerns.   Because of recent events of COVID-19 ("Coronavirus"), follow CDC recommendations:  Wash your hand frequently Avoid touching your face Stay away from people who are sick If you have symptoms such as fever, cough, shortness of breath then call your healthcare provider for further guidance If you are sick, STAY AT HOME unless otherwise directed by your healthcare provider. Follow directions from state and national officials regarding staying safe   At [facility] we value your feedback. You may receive a survey about your visit today. Please share your experience as we strive to create trusting relationships with our patients to provide genuine, compassionate, quality care.  We appreciate your understanding and patience as we review any laboratory studies, imaging, and other diagnostic tests that are ordered as we care for you. Our office policy is 5 business days for review of these results, and any emergent or urgent results are addressed in a timely manner for your best interest. If you do not hear from our office in 1 week, please contact us.   We also encourage the use of MyChart, which contains your medical information for your review as well. If you are not enrolled in this  feature, an access code is on this after visit summary for your convenience. Thank you for allowing [place] to be involved in your care.  It was great to see you today!  I hope you have a Happy Thanksgiving!!

## 2019-09-02 NOTE — Addendum Note (Signed)
Addended by: Gordy Levan, My Rinke A on: 09/02/2019 03:03 PM   Modules accepted: Orders

## 2019-09-02 NOTE — Progress Notes (Signed)
Primary Care Physician:  Marin Olp, MD Primary Gastroenterologist:  Dr. Gala Romney  Chief Complaint  Patient presents with  . Diarrhea    4-5 times per day, runny, a lot of gas. Finished abx 1 1/2 weeks ago and that is when he noticed the diarrhea is more. Has been on vanc x2 with taper and dificid x2 as well    HPI:   [name] is a 73 y.o. male who presents for evaluation of recent C. difficile at the request of Dr. Gala Romney.  The patient has chronically seen Dr. Lucio Edward with [facility].  Last visit with Dr. Fuller Plan 06/11/2019 for C. difficile, ulcerative pancolitis, hemorrhoid.  Known history of A. fib, CAD, CHF, diabetes, TIA, CVA managed on Plavix and Pradaxa, chronic ulcerative pancolitis.  His ulcerative colitis is been managed with Apriso 1.5 g daily.  Hospitalized in July with loose stools for 2 months and was diagnosed C. difficile positive.  He was given 14-day course of vancomycin, recommended Florastor twice daily for 6 weeks.  Stools became firmer and appetite improved but about a week after completing his antibiotic regimen his stools became looser 2-3 times a day with decreased appetite.  His symptoms are deemed consistent with a relapse and planned pulse taper vancomycin regimen with continue Florastor for 3 months and dietary changes including avoid all dairy, minimize caffeine, avoid raw fruits and vegetables.  Recommended follow-up in 8 weeks.  Phone note with GI dated 08/07/2019 indicated at completion of vancomycin taper on 07/23/2019 with watery stools 5-8 times a day and associated urgency.  Recommended Dificid 200 mg twice daily for 10 days.  Continue dietary changes.  Referral to infectious disease.  Saw primary care 08/26/2019 for C. difficile diarrhea.  Noted initial infection after dental work and antibiotics.  He noted complete resolution by the end of the course of Dificid for a few days but then over the previous few days has had 3 bowel movements a day  that are looser than baseline.  His baseline is 2 stools a day.  He requested to restart Dificid however recommended C. difficile GDH and toxin test and consider additional antibiotics if one is positive.  He is still pending infectious disease follow-up.  Stool studies completed 08/27/2019 found C. difficile negative for both GDH antigen as well as toxins a and B.  Recommended he keep his follow-up with infectious disease (currently scheduled for 09/25/2019).  Today he states he's doing ok overall. Stools are getting progressively more frequent (as much as 4-5 times a day; baseline is 2 times a day). Increased frequency occurred after finishing antibiotics. Stools are loose and with urgency. Having a lot of gas as well. Stools look "gelatenous." No watery diarrhea. No normal stools currently. Denies hematochezia. Denies overert abdominal pain, does intermittently have LLQ soreness. Denies N/V, melena, fever, chills, unintentional weight loss. Has had some intentional weight loss. Denies URI or flu-like symptoms. Denies loss of sense of taste or smell. Denies chest pain, dyspnea, dizziness, lightheadedness, syncope, near syncope. Denies any other upper or lower GI symptoms.   Past Medical History:  Diagnosis Date  . Atrial fibrillation ([place])    initial diagnoses 2012  . B12 deficiency    per patient previously taking shots  . C. difficile diarrhea   . CORONARY ARTERY DISEASE 04/12/2007   2 stents last in 2006.   Marland Kitchen DIABETES MELLITUS, TYPE II 04/16/2007  . Diverticulosis of colon (without mention of hemorrhage)   . HYPERLIPIDEMIA  04/16/2007   reveal study. not sure of medication  . HYPERTENSION 04/12/2007  . HYPOTHYROIDISM 04/12/2007  . Ischemic cardiomyopathy    cath 35-40%  . MYOCARDIAL INFARCTION, HX OF 04/12/2007  . OBESITY 06/16/2009  . SLEEP APNEA 04/12/2007   CPAP  . Stroke ([place])   . ULCERATIVE COLITIS, LEFT SIDED 11/23/2010    Past Surgical History:  Procedure Laterality Date  . CARDIAC  CATHETERIZATION  10/2002   STENT. 2 stents Dr. Maurene Capes  . CARDIAC CATHETERIZATION N/A 10/28/2015   Procedure: Right/Left Heart Cath and Coronary Angiography;  Surgeon: Sherren Mocha, MD;  Location: [facility];  Service: Cardiovascular;  Laterality: N/A;  . CORONARY STENT PLACEMENT  2008   LAD   . RIGHT/LEFT HEART CATH AND CORONARY ANGIOGRAPHY N/A 06/01/2018   Procedure: RIGHT/LEFT HEART CATH AND CORONARY ANGIOGRAPHY;  Surgeon: Sherren Mocha, MD;  Location: [facility];  Service: Cardiovascular;  Laterality: N/A;  . TONSILLECTOMY      Current Outpatient Medications  Medication Sig Dispense Refill  . acetaminophen (TYLENOL) 500 MG tablet Take 1,000 mg by mouth every 6 (six) hours as needed for headache (pain).    . APRISO 0.375 g 24 hr capsule TAKE 4 CAPSULES (1.5 GRAMS TOTAL) BY MOUTH DAILY 120 capsule 3  . atorvastatin (LIPITOR) 20 MG tablet TAKE ONE TABLET (20MG TOTAL) BY MOUTH DAILY (Patient taking differently: Take 20 mg by mouth daily with breakfast. ) 90 tablet 1  . carvedilol (COREG) 25 MG tablet TAKE ONE TABLET BY MOUTH TWICE A DAY (Patient taking differently: Take 25 mg by mouth 2 (two) times daily with a meal. ) 180 tablet 2  . clopidogrel (PLAVIX) 75 MG tablet Take 1 tablet (75 mg total) by mouth daily. (Patient taking differently: Take 75 mg by mouth daily with breakfast. ) 90 tablet 3  . Coenzyme Q10 (COQ10 PO) Take 1 capsule by mouth daily with supper.    . empagliflozin (JARDIANCE) 10 MG TABS tablet TAKE ONE (1) TABLET BY MOUTH EVERY DAY (Patient taking differently: Take 10 mg by mouth daily with breakfast. ) 90 tablet 3  . fish oil-omega-3 fatty acids 1000 MG capsule Take 1 g by mouth daily with supper.     . furosemide (LASIX) 40 MG tablet Take 1 tablet (40 mg total) by mouth daily. Take extra tablet once daily in the PM as needed for weight gain 3 lbs in 24 hrs or swelling. (Patient taking differently: Take 40 mg by mouth See admin instructions. Take one tablet (40  mg) by mouth daily with breakfast, may take an extra tablet once daily in the evening as needed for weight gain 3 lbs in 24 hrs or swelling.) 30 tablet 11  . glipiZIDE (GLUCOTROL) 10 MG tablet TAKE ONE TABLET BY MOUTH TWICE A DAY (Patient taking differently: Take 10 mg by mouth 2 (two) times daily before a meal. ) 180 tablet 2  . glucosamine-chondroitin 500-400 MG tablet Take 1 tablet by mouth daily as needed (knee flare up or pain).    Marland Kitchen glucose blood test strip Use as instructed to test 4 time daily 400 each 3  . isosorbide mononitrate (IMDUR) 60 MG 24 hr tablet TAKE ONE (1) TABLET BY MOUTH EVERY DAY 90 tablet 3  . PRADAXA 150 MG CAPS capsule TAKE ONE (1) CAPSULE BY MOUTH TWICE A DAY. (EVERY 12 HOURS.) (EVERY 12 HOURS) (Patient taking differently: Take 150 mg by mouth 2 (two) times daily with a meal. ) 60 capsule 6  .  saccharomyces boulardii (FLORASTOR) 250 MG capsule Take 1 capsule (250 mg total) by mouth 2 (two) times daily.    . sacubitril-valsartan (ENTRESTO) 97-103 MG Take 1 tablet by mouth 2 (two) times daily. (Patient taking differently: Take 1 tablet by mouth 2 (two) times daily with a meal. ) 60 tablet 6  . sitaGLIPtin (JANUVIA) 100 MG tablet TAKE ONE (1) TABLET BY MOUTH EVERY DAY (Patient taking differently: Take 100 mg by mouth daily with breakfast. ) 90 tablet 2  . spironolactone (ALDACTONE) 25 MG tablet TAKE ONE TABLET (25MG TOTAL) BY MOUTH DAILY (Patient taking differently: Take 25 mg by mouth daily with breakfast. ) 90 tablet 1  . SYNTHROID 50 MCG tablet TAKE 1 TABLET (50 MG TOTAL) BY MOUTH DAILY BEFORE BREAKFAST (Patient taking differently: Take 50 mcg by mouth daily before breakfast. ) 90 tablet 3   No current facility-administered medications for this visit.     Allergies as of 09/02/2019 - Review Complete 09/02/2019  Allergen Reaction Noted  . Clindamycin/lincomycin  04/26/2019    Family History  Problem Relation Age of Onset  . Heart attack Mother        mid 74s  . Heart  disease Father        H/O CAD, CABG, VALVE SURGERY  . Hypertension Brother   . Obesity Brother   . Stomach cancer Maternal Aunt   . Stomach cancer Paternal Grandmother        ? colon   . Colon cancer Neg Hx     Social History   Socioeconomic History  . Marital status: Married    Spouse name: Not on file  . Number of children: 1  . Years of education: Not on file  . Highest education level: Not on file  Occupational History  . Occupation: RETIRED    Employer: RETIRED  Social Needs  . Financial resource strain: Not on file  . Food insecurity    Worry: Not on file    Inability: Not on file  . Transportation needs    Medical: Not on file    Non-medical: Not on file  Tobacco Use  . Smoking status: Former Smoker    Packs/day: 1.00    Years: 5.00    Pack years: 5.00    Types: Cigarettes    Quit date: 04/13/1966    Years since quitting: 53.4  . Smokeless tobacco: Never Used  Substance and Sexual Activity  . Alcohol use: No    Alcohol/week: 0.0 standard drinks    Comment: no more beer  . Drug use: No  . Sexual activity: Not on file  Lifestyle  . Physical activity    Days per week: Not on file    Minutes per session: Not on file  . Stress: Not on file  Relationships  . Social Herbalist on phone: Not on file    Gets together: Not on file    Attends religious service: Not on file    Active member of club or organization: Not on file    Attends meetings of clubs or organizations: Not on file    Relationship status: Not on file  . Intimate partner violence    Fear of current or ex partner: Not on file    Emotionally abused: Not on file    Physically abused: Not on file    Forced sexual activity: Not on file  Other Topics Concern  . Not on file  Social History Narrative   Cardiorehab 3  days a week. 45 minutes to an hour-stationary bike.    GRANDDAUGHTER (MS. 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       Married for 37 years in 2015, married previously for 14 years. Daughter with first wife and 3 grandkids.    Lives alone with wife. Get to see grandkids a lot. New grandchild in middle of 69      Hobbies-yardwork, previously liked to hunt and fish, does some target shooting    Review of Systems: General: Negative for anorexia, weight loss, fever, chills, fatigue, weakness. ENT: Negative for hoarseness, difficulty swallowing. CV: Negative for chest pain, angina, palpitations, peripheral edema.  Respiratory: Negative for dyspnea at rest, cough, sputum, wheezing.  GI: See history of present illness. MS: Negative for joint pain, low back pain.  Derm: Negative for rash or itching.  Endo: Negative for unusual weight change.  Heme: Negative for bruising or bleeding. Allergy: Negative for rash or hives.    Physical Exam: BP (!) 182/106   Pulse 84   Temp (!) 97.3 F (36.3 C) (Oral)   Ht 5' 8"  (1.727 m)   Wt 236 lb 6.4 oz (107.2 kg)   BMI 35.94 kg/m  General:   Alert and oriented. Pleasant and cooperative. Well-nourished and well-developed.  Head:  Normocephalic and atraumatic. Eyes:  Without icterus, sclera clear and conjunctiva pink.  Ears:  Normal auditory acuity. Cardiovascular:  S1, S2 present without murmurs appreciated. Extremities without clubbing or edema. Respiratory:  Clear to auscultation bilaterally. No wheezes, rales, or rhonchi. No distress.  Gastrointestinal:  +BS, soft, non-tender and non-distended. No HSM noted. No guarding or rebound. No masses appreciated.  Rectal:  Deferred  Musculoskalatal:  Symmetrical without gross deformities. Neurologic:  Alert and oriented x4;  grossly normal neurologically. Psych:  Alert and cooperative. Normal mood and affect. Heme/Lymph/Immune: No excessive bruising noted.    09/02/2019 1:53 PM   Disclaimer: This note was dictated with voice recognition software. Similar sounding  words can inadvertently be transcribed and may not be corrected upon review.

## 2019-09-02 NOTE — Assessment & Plan Note (Signed)
The patient has had C. difficile in at least 1-2 documented recurrences.  He has had treatment with vancomycin for 14 days, vancomycin prolonged taper, Dificid x2 courses.  ID this point we have exhausted antibiotic options for treatment.  Luckily, his most recent C. difficile GDH antigen and toxin A/B about 6 days ago were both negative.  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  Further diarrhea management as per above.  He does have an upcoming appointment with infectious disease.  I will be interested to see their recommendations.  At this point, if he has another recurrence the best option is probably fecal matter transplant.  We could refer [facility] to [facility] for this if needed.  Follow-up in 2 months and call us with any worsening or severe symptoms.

## 2019-09-02 NOTE — Assessment & Plan Note (Signed)
Chronic progressively worsening diarrhea with C. difficile and at least 1-2 recurrences as documented above.  Most recent C. difficile testing was negative.  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  I recommended he continue the probiotics he is currently taking.  I have sent in Bentyl 10 mg to his pharmacy that he can take up to 4 times a day as needed for diarrhea.  He can also use Imodium as needed.  Going to check labs including CBC and CMP.  Interestingly, he has hypothyroidism and has been on Synthroid for some time.  His TSH historically runs pretty low/normal.  Most recently this was checked 06/13/2019 and resulted at 0.62 (normal range from 0.35-4.5).  I will recheck TSH to see where he is at.  If he is having abnormal thyroid function this could cause worsening diarrhea.  His dose of Synthroid may need to be reduced depending on result.  We will defer this to primary care.

## 2019-09-03 LAB — COMPREHENSIVE METABOLIC PANEL
AG Ratio: 1.7 (calc) (ref 1.0–2.5)
ALT: 10 U/L (ref 9–46)
AST: 18 U/L (ref 10–35)
Albumin: 4.1 g/dL (ref 3.6–5.1)
Alkaline phosphatase (APISO): 67 U/L (ref 35–144)
BUN: 11 mg/dL (ref 7–25)
CO2: 26 mmol/L (ref 20–32)
Calcium: 9 mg/dL (ref 8.6–10.3)
Chloride: 101 mmol/L (ref 98–110)
Creat: 1 mg/dL (ref 0.70–1.18)
Globulin: 2.4 g/dL (calc) (ref 1.9–3.7)
Glucose, Bld: 84 mg/dL (ref 65–99)
Potassium: 3.5 mmol/L (ref 3.5–5.3)
Sodium: 140 mmol/L (ref 135–146)
Total Bilirubin: 0.7 mg/dL (ref 0.2–1.2)
Total Protein: 6.5 g/dL (ref 6.1–8.1)

## 2019-09-03 LAB — CBC WITH DIFFERENTIAL/PLATELET
Absolute Monocytes: 826 cells/uL (ref 200–950)
Basophils Absolute: 18 cells/uL (ref 0–200)
Basophils Relative: 0.3 %
Eosinophils Absolute: 12 cells/uL — ABNORMAL LOW (ref 15–500)
Eosinophils Relative: 0.2 %
HCT: 44.1 % (ref 38.5–50.0)
Hemoglobin: 14.8 g/dL (ref 13.2–17.1)
Lymphs Abs: 566 cells/uL — ABNORMAL LOW (ref 850–3900)
MCH: 27.3 pg (ref 27.0–33.0)
MCHC: 33.6 g/dL (ref 32.0–36.0)
MCV: 81.4 fL (ref 80.0–100.0)
MPV: 10.5 fL (ref 7.5–12.5)
Monocytes Relative: 14 %
Neutro Abs: 4478 cells/uL (ref 1500–7800)
Neutrophils Relative %: 75.9 %
Platelets: 145 10*3/uL (ref 140–400)
RBC: 5.42 10*6/uL (ref 4.20–5.80)
RDW: 14 % (ref 11.0–15.0)
Total Lymphocyte: 9.6 %
WBC: 5.9 10*3/uL (ref 3.8–10.8)

## 2019-09-03 LAB — TSH: TSH: 0.79 mIU/L (ref 0.40–4.50)

## 2019-09-04 ENCOUNTER — Telehealth: Payer: Self-pay

## 2019-09-04 NOTE — Telephone Encounter (Signed)
Pt's family called 10/04/2019 2:49 PM, inquiring about pts lab results.

## 2019-09-10 NOTE — Telephone Encounter (Signed)
Noted, spoke with pt. Pt was notified of results. Pt is taking a Probiotic daily and Bentyl twice daily. Pt feels the diarrhea is slowing down some. Pt says he is going half the time that he was previously going. Pt wasn't able to give a number on how many times he is currently having diarrhea.

## 2019-09-10 NOTE — Telephone Encounter (Signed)
See result note and MyChart message.

## 2019-09-11 ENCOUNTER — Ambulatory Visit: Payer: Medicare Other | Admitting: Gastroenterology

## 2019-09-11 NOTE — Telephone Encounter (Signed)
Noted, pt is aware 

## 2019-09-11 NOTE — Telephone Encounter (Signed)
Noted. Call if any worsening. Keep follow-up appointment

## 2019-09-12 ENCOUNTER — Other Ambulatory Visit: Payer: Self-pay

## 2019-09-12 DIAGNOSIS — Z20828 Contact with and (suspected) exposure to other viral communicable diseases: Secondary | ICD-10-CM | POA: Diagnosis not present

## 2019-09-12 DIAGNOSIS — Z20822 Contact with and (suspected) exposure to covid-19: Secondary | ICD-10-CM

## 2019-09-15 LAB — NOVEL CORONAVIRUS, NAA: SARS-CoV-2, NAA: DETECTED — AB

## 2019-09-16 ENCOUNTER — Telehealth: Payer: Self-pay

## 2019-09-16 ENCOUNTER — Telehealth: Payer: Self-pay | Admitting: Unknown Physician Specialty

## 2019-09-16 ENCOUNTER — Other Ambulatory Visit: Payer: Self-pay | Admitting: Unknown Physician Specialty

## 2019-09-16 DIAGNOSIS — U071 COVID-19: Secondary | ICD-10-CM

## 2019-09-16 NOTE — Telephone Encounter (Signed)
Does this require a virtual?

## 2019-09-16 NOTE — Telephone Encounter (Signed)
Called to discuss with patient about Covid symptoms and the use of bamlanivimab, a monoclonal antibody infusion for those with mild to moderate Covid symptoms and at a high risk of hospitalization.  Pt is qualified for this infusion at the [facility] infusion center due to Age > 54 and other co-morbid conditions   Left message with him and grand daughter to call back

## 2019-09-16 NOTE — Telephone Encounter (Signed)
Symptoms of cough 5-6 days. Discussed with patient about Covid symptoms and the use of bamlanivimab, a monoclonal antibody infusion for those with mild to moderate Covid symptoms and at a high risk of hospitalization.  Pt is qualified for this infusion at the [facility] infusion center due to Age > 48 and co-morbidities which were addressed with the patient and are actively being managed by a [facility] provider.    After discussing the infusion's costs, potential benefits and side effects, the patient has decided to accept treatment with monoclonal antibodies.    ,

## 2019-09-16 NOTE — Telephone Encounter (Signed)
Called granddaughter I.e monoclonal ab infusion for Covid 19.  She called about it earlier.    Left message to call back

## 2019-09-16 NOTE — Telephone Encounter (Signed)
Happy to do a virtual visit if they would like.  Patient will be automatically entered into an algorithm that [facility] completes on a daily basis-I believe it is 6-9 patients per day are chosen to participate in the outpatient antibody therapy-I think this is likely what she is talking about.  Since his positive test is within the common system he will automatically be entered into consideration but that does not mean he will be chosen.  I do not have any "pull" beyond the algorithm they use but with his risk factors I would love if he is accepted into it.

## 2019-09-16 NOTE — Telephone Encounter (Signed)
Copied from [place] [id number]. Topic: General - Other >> Sep 16, 2019 12:25 PM Leward Quan A wrote: Reason for CRM: Patient grand daughter [name] called to say that he was diagnosed with covid and want to be a part of the study being done in the hospital and need to know if Dr Yong Channel can please recommend him for this study or call back with how he can get to be a part of this study. Ph# [phone]

## 2019-09-18 ENCOUNTER — Ambulatory Visit (HOSPITAL_COMMUNITY)
Admission: RE | Admit: 2019-09-18 | Discharge: 2019-09-18 | Disposition: A | Discharge status: Home / Self Care | Payer: Medicare Other | Source: Ambulatory Visit | Attending: Pulmonary Disease | Admitting: Pulmonary Disease

## 2019-09-18 DIAGNOSIS — Z23 Encounter for immunization: Secondary | ICD-10-CM | POA: Diagnosis not present

## 2019-09-18 DIAGNOSIS — U071 COVID-19: Secondary | ICD-10-CM | POA: Diagnosis not present

## 2019-09-18 MED ORDER — ALBUTEROL SULFATE HFA 108 (90 BASE) MCG/ACT IN AERS: 2.0000 | INHALATION_SPRAY | Freq: Once | RESPIRATORY_TRACT | Status: DC | PRN

## 2019-09-18 MED ORDER — SODIUM CHLORIDE 0.9 % IV SOLN: INTRAVENOUS | Status: DC | PRN

## 2019-09-18 MED ORDER — METHYLPREDNISOLONE SODIUM SUCC 125 MG IJ SOLR: 125.0000 mg | Freq: Once | INTRAMUSCULAR | Status: DC | PRN

## 2019-09-18 MED ORDER — FAMOTIDINE IN NACL 20-0.9 MG/50ML-% IV SOLN: 20.0000 mg | Freq: Once | INTRAVENOUS | Status: DC | PRN

## 2019-09-18 MED ORDER — DIPHENHYDRAMINE HCL 50 MG/ML IJ SOLN: 50.0000 mg | Freq: Once | INTRAMUSCULAR | Status: DC | PRN

## 2019-09-18 MED ORDER — EPINEPHRINE 0.3 MG/0.3ML IJ SOAJ: 0.3000 mg | Freq: Once | INTRAMUSCULAR | Status: DC | PRN

## 2019-09-18 MED ORDER — SODIUM CHLORIDE 0.9 % IV SOLN
700.0000 mg | Freq: Once | INTRAVENOUS | Status: AC
  Administered 2019-09-18: 700 mg via INTRAVENOUS
  Filled 2019-09-18 (×2): qty 20

## 2019-09-18 NOTE — Progress Notes (Signed)
  Diagnosis: COVID-19  Physician:   Dr. Joya Gaskins  Procedure: [facility]id Infusion Clinic Med: bamlanivimab infusion - Provided patient with bamlanimivab fact sheet for patients, parents and caregivers prior to infusion.  Complications: No immediate complications noted.  Discharge: Discharged home   [facility] 09/18/2019

## 2019-09-18 NOTE — Telephone Encounter (Signed)
Called and Spoke with Sharyn Lull and she states pt is going to get the infusion today at 12pm.

## 2019-09-25 ENCOUNTER — Ambulatory Visit (INDEPENDENT_AMBULATORY_CARE_PROVIDER_SITE_OTHER): Payer: Medicare Other | Admitting: Internal Medicine

## 2019-09-25 ENCOUNTER — Other Ambulatory Visit: Payer: Self-pay | Admitting: Cardiovascular Disease

## 2019-09-25 ENCOUNTER — Other Ambulatory Visit: Payer: Self-pay

## 2019-09-25 DIAGNOSIS — R109 Unspecified abdominal pain: Secondary | ICD-10-CM | POA: Diagnosis not present

## 2019-09-25 DIAGNOSIS — R197 Diarrhea, unspecified: Secondary | ICD-10-CM

## 2019-09-25 DIAGNOSIS — A0472 Enterocolitis due to Clostridium difficile, not specified as recurrent: Secondary | ICD-10-CM | POA: Diagnosis not present

## 2019-09-25 DIAGNOSIS — I255 Ischemic cardiomyopathy: Secondary | ICD-10-CM | POA: Diagnosis not present

## 2019-09-25 DIAGNOSIS — E785 Hyperlipidemia, unspecified: Secondary | ICD-10-CM

## 2019-09-25 MED ORDER — DICYCLOMINE HCL 10 MG PO CAPS: 10.0000 mg | ORAL_CAPSULE | Freq: Three times a day (TID) | ORAL | 3 refills | Status: DC

## 2019-09-25 NOTE — Progress Notes (Signed)
RFV: initial visit for cdifficile recurrence-televisit identified by name and dob  Patient ID: [name], male   DOB: 05-Apr-1946, 73 y.o.   MRN: [id number]  HPI 73yo M with hx of inflammatory colitis several decades ago. This year had dental abscess and received tx of clindamycin. Had recurrent episodes of cdifficille. Initially saw dr Sydell Axon in [place] then referred to dr stark. Was given a course of bentyl QID helps with abd cramping,. Doesn't have diarrhea when he takes bentyl  Last time treated for cdiff by dr stark in October- was on a prolonged vancomycin taper which stopped in November.  Outpatient Encounter Medications as of 09/25/2019  Medication Sig   APRISO 0.375 g 24 hr capsule TAKE 4 CAPSULES (1.5 GRAMS TOTAL) BY MOUTH DAILY   carvedilol (COREG) 25 MG tablet TAKE ONE TABLET BY MOUTH TWICE A DAY (Patient taking differently: Take 25 mg by mouth 2 (two) times daily with a meal. )   clopidogrel (PLAVIX) 75 MG tablet Take 1 tablet (75 mg total) by mouth daily. (Patient taking differently: Take 75 mg by mouth daily with breakfast. )   Coenzyme Q10 (COQ10 PO) Take 1 capsule by mouth daily with supper.   dicyclomine (BENTYL) 10 MG capsule Take 1 capsule (10 mg total) by mouth 4 (four) times daily as needed (Diarrhea or abdominal cramping).   empagliflozin (JARDIANCE) 10 MG TABS tablet TAKE ONE (1) TABLET BY MOUTH EVERY DAY (Patient taking differently: Take 10 mg by mouth daily with breakfast. )   fish oil-omega-3 fatty acids 1000 MG capsule Take 1 g by mouth daily with supper.    furosemide (LASIX) 40 MG tablet TAKE ONE TABLET BY MOUTH EVERY DAY.   glipiZIDE (GLUCOTROL) 10 MG tablet TAKE ONE TABLET BY MOUTH TWICE A DAY (Patient taking differently: Take 10 mg by mouth 2 (two) times daily before a meal. )   isosorbide mononitrate (IMDUR) 60 MG 24 hr tablet TAKE ONE (1) TABLET BY MOUTH EVERY DAY   loperamide (IMODIUM) 1 MG/5ML solution Take by mouth as needed for  diarrhea or loose stools.   PRADAXA 150 MG CAPS capsule TAKE ONE (1) CAPSULE BY MOUTH TWICE A DAY. (EVERY 12 HOURS.) (EVERY 12 HOURS) (Patient taking differently: Take 150 mg by mouth 2 (two) times daily with a meal. )   saccharomyces boulardii (FLORASTOR) 250 MG capsule Take 1 capsule (250 mg total) by mouth 2 (two) times daily.   sacubitril-valsartan (ENTRESTO) 97-103 MG Take 1 tablet by mouth 2 (two) times daily. (Patient taking differently: Take 1 tablet by mouth 2 (two) times daily with a meal. )   sitaGLIPtin (JANUVIA) 100 MG tablet TAKE ONE (1) TABLET BY MOUTH EVERY DAY (Patient taking differently: Take 100 mg by mouth daily with breakfast. )   spironolactone (ALDACTONE) 25 MG tablet TAKE ONE TABLET (25MG TOTAL) BY MOUTH DAILY (Patient taking differently: Take 25 mg by mouth daily with breakfast. )   SYNTHROID 50 MCG tablet TAKE 1 TABLET (50 MG TOTAL) BY MOUTH DAILY BEFORE BREAKFAST (Patient taking differently: Take 50 mcg by mouth daily before breakfast. )   [DISCONTINUED] atorvastatin (LIPITOR) 20 MG tablet TAKE ONE TABLET (20MG TOTAL) BY MOUTH DAILY (Patient taking differently: Take 20 mg by mouth daily with breakfast. )   acetaminophen (TYLENOL) 500 MG tablet Take 1,000 mg by mouth every 6 (six) hours as needed for headache (pain).   diclofenac Sodium (VOLTAREN) 1 % GEL Apply topically.   glucosamine-chondroitin 500-400 MG tablet Take 1 tablet by mouth  daily as needed (knee flare up or pain).   glucose blood test strip Use as instructed to test 4 time daily (Patient not taking: Reported on 09/25/2019)   No facility-administered encounter medications on file as of 09/25/2019.     Patient Active Problem List   Diagnosis Date Noted   Atherosclerotic heart disease of native coronary artery with other forms of angina pectoris ([place]) 04/26/2019   C. difficile colitis 04/17/2019   Dehydration 04/15/2019   Diarrhea 04/15/2019   ARF (acute renal failure) ([place]) 04/15/2019    Chronic combined systolic and diastolic CHF (congestive heart failure) ([place]) 04/15/2019   Hypokalemia 04/15/2019   AKI (acute kidney injury) ([place])    Acute on chronic combined systolic and diastolic CHF (congestive heart failure) ([place]) 02/07/2018   Former smoker 08/11/2016   MCI (mild cognitive impairment) 01/19/2016   History of transient ischemic attack (TIA) 10/24/2015   Primary osteoarthritis of left knee 02/09/2015   CHF (congestive heart failure), NYHA class II ([place]) 05/09/2014   Allergic rhinitis 06/25/2012   Ulcerative pancolitis without complication ([place]) 78/29/5621   GERD (gastroesophageal reflux disease) 08/09/2011   Morbid obesity ([place]) 08/09/2011   Atrial fibrillation ([place]) 12/08/2010   VITAMIN B12 DEFICIENCY 11/29/2010   Type II diabetes mellitus with peripheral circulatory disorder ([employer]) 04/16/2007   Hyperlipidemia 04/16/2007   Hypothyroidism 04/12/2007   Essential hypertension 04/12/2007   CAD (coronary artery disease), native coronary artery 04/12/2007   OSA (obstructive sleep apnea) 04/12/2007   Social History   Socioeconomic History   Marital status: Married    Spouse name: Not on file   Number of children: 1   Years of education: Not on file   Highest education level: Not on file  Occupational History   Occupation: RETIRED    Employer: RETIRED  Tobacco Use   Smoking status: Former Smoker    Packs/day: 1.00    Years: 5.00    Pack years: 5.00    Types: Cigarettes    Quit date: 04/13/1966    Years since quitting: 53.4   Smokeless tobacco: Never Used  Substance and Sexual Activity   Alcohol use: No    Alcohol/week: 0.0 standard drinks    Comment: no more beer   Drug use: No   Sexual activity: Not on file  Other Topics Concern   Not on file  Social History Narrative   Cardiorehab 3 days a week. 45 minutes to an hour-stationary bike.    GRANDDAUGHTER (MS. 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       Married for 37 years in 2015, married previously for 14 years. Daughter with first wife and 3 grandkids.    Lives alone with wife. Get to see grandkids a lot. New grandchild in middle of 25      Hobbies-yardwork, previously liked to hunt and fish, does some target shooting   Social Determinants of Health   Financial Resource Strain:    Difficulty of Paying Living Expenses: Not on file  Food Insecurity:    Worried About Charity fundraiser in the Last Year: Not on file   [employer] of Food in the Last Year: Not on file  Transportation Needs:    Lack of Transportation (Medical): Not on file   Lack of Transportation (Non-Medical): Not on file  Physical Activity:    Days of Exercise per Week:  Not on file   Minutes of Exercise per Session: Not on file  Stress:    Feeling of Stress : Not on file  Social Connections:    Frequency of Communication with Friends and Family: Not on file   Frequency of Social Gatherings with Friends and Family: Not on file   Attends Religious Services: Not on file   Active Member of Clubs or Organizations: Not on file   Attends Archivist Meetings: Not on file   Marital Status: Not on file  Intimate Partner Violence:    Fear of Current or Ex-Partner: Not on file   Emotionally Abused: Not on file   Physically Abused: Not on file   Sexually Abused: Not on file   family history includes Heart attack in his mother; Heart disease in his father; Hypertension in his brother; Obesity in his brother; Stomach cancer in his maternal aunt and paternal grandmother.  There are no preventive care reminders to display for this patient.   Review of Systems Review of Systems  Constitutional: Negative for fever, chills, diaphoresis, activity change, appetite change, fatigue and unexpected weight change.  HENT: Negative for congestion, sore throat, rhinorrhea,  sneezing, trouble swallowing and sinus pressure.  Eyes: Negative for photophobia and visual disturbance.  Respiratory: Negative for cough, chest tightness, shortness of breath, wheezing and stridor.  Cardiovascular: Negative for chest pain, palpitations and leg swelling.  Gastrointestinal: Negative for nausea, vomiting, abdominal pain, diarrhea, constipation, blood in stool, abdominal distention and anal bleeding.  Genitourinary: Negative for dysuria, hematuria, flank pain and difficulty urinating.  Musculoskeletal: Negative for myalgias, back pain, joint swelling, arthralgias and gait problem.  Skin: Negative for color change, pallor, rash and wound.  Neurological: Negative for dizziness, tremors, weakness and light-headedness.  Hematological: Negative for adenopathy. Does not bruise/bleed easily.  Psychiatric/Behavioral: Negative for behavioral problems, confusion, sleep disturbance, dysphoric mood, decreased concentration and agitation.    Physical Exam   No exam due to televisit CBC Lab Results  Component Value Date   WBC 5.9 09/02/2019   RBC 5.42 09/02/2019   HGB 14.8 09/02/2019   HCT 44.1 09/02/2019   PLT 145 09/02/2019   MCV 81.4 09/02/2019   MCH 27.3 09/02/2019   MCHC 33.6 09/02/2019   RDW 14.0 09/02/2019   LYMPHSABS 566 (L) 09/02/2019   MONOABS 0.6 08/26/2019   EOSABS 12 (L) 09/02/2019    BMET Lab Results  Component Value Date   NA 140 09/02/2019   K 3.5 09/02/2019   CL 101 09/02/2019   CO2 26 09/02/2019   GLUCOSE 84 09/02/2019   BUN 11 09/02/2019   CREATININE 1.00 09/02/2019   CALCIUM 9.0 09/02/2019   GFRNONAA >60 04/18/2019   GFRAA >60 04/18/2019      Assessment and Plan  Abdominal cramping =Refill bentyl  Recurrent cdifficile = finished taper in mid November. We will ask for him to keep eye on it. No need for retreatment at this time.  Touch in 4 wk to follow up  covid-19 - now better. Had minimal symptoms, did get mAb infusion

## 2019-09-30 ENCOUNTER — Other Ambulatory Visit: Payer: Self-pay | Admitting: Internal Medicine

## 2019-09-30 ENCOUNTER — Other Ambulatory Visit (HOSPITAL_COMMUNITY): Payer: Self-pay | Admitting: Internal Medicine

## 2019-09-30 ENCOUNTER — Other Ambulatory Visit: Payer: Self-pay | Admitting: Cardiovascular Disease

## 2019-09-30 DIAGNOSIS — E785 Hyperlipidemia, unspecified: Secondary | ICD-10-CM

## 2019-10-15 ENCOUNTER — Other Ambulatory Visit: Payer: Self-pay | Admitting: Cardiovascular Disease

## 2019-10-30 ENCOUNTER — Other Ambulatory Visit: Payer: Self-pay | Admitting: Gastroenterology

## 2019-10-30 ENCOUNTER — Other Ambulatory Visit: Payer: Self-pay | Admitting: Nurse Practitioner

## 2019-10-30 ENCOUNTER — Telehealth: Payer: Self-pay | Admitting: Gastroenterology

## 2019-10-30 NOTE — Telephone Encounter (Signed)
Pt has transferred care from [facility] to [facility]

## 2019-11-05 ENCOUNTER — Other Ambulatory Visit: Payer: Self-pay

## 2019-11-05 ENCOUNTER — Ambulatory Visit (INDEPENDENT_AMBULATORY_CARE_PROVIDER_SITE_OTHER): Payer: Medicare Other | Admitting: Nurse Practitioner

## 2019-11-05 ENCOUNTER — Encounter: Payer: Self-pay | Admitting: Nurse Practitioner

## 2019-11-05 ENCOUNTER — Other Ambulatory Visit: Payer: Self-pay | Admitting: Internal Medicine

## 2019-11-05 VITALS — BP 146/91 | HR 71 | Temp 97.3°F | Ht 68.0 in | Wt 239.8 lb

## 2019-11-05 DIAGNOSIS — A0472 Enterocolitis due to Clostridium difficile, not specified as recurrent: Secondary | ICD-10-CM

## 2019-11-05 DIAGNOSIS — K5100 Ulcerative (chronic) pancolitis without complications: Secondary | ICD-10-CM | POA: Diagnosis not present

## 2019-11-05 DIAGNOSIS — E1151 Type 2 diabetes mellitus with diabetic peripheral angiopathy without gangrene: Secondary | ICD-10-CM

## 2019-11-05 DIAGNOSIS — R197 Diarrhea, unspecified: Secondary | ICD-10-CM

## 2019-11-05 NOTE — Patient Instructions (Signed)
Your health issues we discussed today were:   Previous C. difficile infection and diarrhea: 1. As we discussed, your most recent C. difficile test was negative 2. 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 Continue taking Bentyl as needed to help control your diarrhea. 4. Continue taking probiotics for now to help restore the good bacteria in your colon 5. Call us if you have any worsening or recurrent diarrhea  Ulcerative colitis: 1. Continue taking Apriso for your ulcerative colitis 2. At your next follow-up visit we will discuss whether to repeat your colonoscopy (it appears your last one was in 2012 and you were due for repeat in 2017) 3. I wanted to wait till your diarrhea resolved to discuss this 4. Call us if you have any problems related to your ulcerative colitis such as rectal bleeding  Overall I recommend:  1. Continue your other current medications 2. Return for follow-up in 6 months 3. Call us if you have any questions or concerns.   ---------------------------------------------------------------  COVID-19 Vaccine Information can be found at: [email or web address].co.uk For questions related to vaccine distribution or appointments, please email vaccine@ .[email or web address] or call [phone].   ---------------------------------------------------------------   At [facility] we value your feedback. You may receive a survey about your visit today. Please share your experience as we strive to create trusting relationships with our patients to provide genuine, compassionate, quality care.  We appreciate your understanding and patience as we review any laboratory studies, imaging, and other diagnostic tests that are ordered as we care for you. Our office policy is 5 business days for review of these results, and any emergent or urgent results are addressed in a timely manner for your best  interest. If you do not hear from our office in 1 week, please contact us.   We also encourage the use of MyChart, which contains your medical information for your review as well. If you are not enrolled in this feature, an access code is on this after visit summary for your convenience. Thank you for allowing [place] to be involved in your care.  It was great to see you today!  I hope you have a great day!!

## 2019-11-05 NOTE — Progress Notes (Signed)
Referring Provider: Marin Olp, MD Primary Care Physician:  Marin Olp, MD Primary GI:  Dr. Gala Romney  Chief Complaint  Patient presents with  . Follow-up    multiple bm's a day    HPI:   ETHEN [name] is a 74 y.o. male who presents for follow-up on diarrhea and C. difficile infection.  Previously seen Dr. Lucio Edward with [facility].  Has been seen for C. difficile, ulcerative pancolitis, hemorrhoids.  On Plavix and Pradaxa for TIA, CVA, CAD, A. fib, CHF, diabetes.  Ulcerative colitis previously managed on Apriso 1.5 g daily.  Diagnosed in July 2020 for C. difficile and was given 14 days of vancomycin and Florastor.  A week after finishing antibiotics began having loose stools again and deemed consistent with a relapse prescribed pulse taper vancomycin regimen and Florastor.  Had a similar return of symptoms after completing pulse taper and was started on Dificid 200 mg twice daily for 10 days.  He was referred to infectious disease.  He did have return of diarrhea a few days after completing Dificid.  Recheck of his stool studies were negative for C. difficile GDH antigen as well as toxins a and B.  Recommended keep infectious disease appointment on 09/25/2019.  At his last visit he noted stools progressively more frequent up to 4-5 times a day (baseline is twice daily).  Loose stools with urgency and a lot of gas, stools "gelatinous" but not watery diarrhea.  No overt abdominal pain.  No other GI complaints.  Recommended updated labs, continue probiotics, avoid dairy and high-fiber foods, Bentyl up to 4 times a day as needed, Imodium as needed, keep infectious disease appointment, follow-up in 2 months.  Labs drawn 09/02/2019 found normal TSH, essentially normal CBC, normal CMP.  Unfortunately it appears he became infected with COVID-19 (positive test on 09/12/2019).  Given his 5 to 6 days of cough and high risk due to age and comorbidities he was enrolled in a monoclonal  antibody infusion center.  He did see infectious disease 09/25/2019 and indicated improvement after monoclonal antibody infusion for COVID-19.  Has had recurrent C. difficile and finished his taper in mid November and recommended he keep an eye on his stools and no need for retreatment at this time.  Abdominal cramping and loose stools improved with Bentyl.  They recommended continuing this and provided him a refill.  Today he states he's doing better. Still has multiple stools a day but no diarrhea on Bentyl. He also made a change to his diet to eliminate dairy. He also will have an apple with peanut butter. He is drinking pedialyte instead of carbonated drinks (other than an occasional diet Ginger-Ale). Denies abdominal pain, N/V, fever, chills, unintentional weight loss.   Past Medical History:  Diagnosis Date  . Atrial fibrillation ([place])    initial diagnoses 2012  . B12 deficiency    per patient previously taking shots  . C. difficile diarrhea   . CORONARY ARTERY DISEASE 04/12/2007   2 stents last in 2006.   Marland Kitchen DIABETES MELLITUS, TYPE II 04/16/2007  . Diverticulosis of colon (without mention of hemorrhage)   . HYPERLIPIDEMIA 04/16/2007   reveal study. not sure of medication  . HYPERTENSION 04/12/2007  . HYPOTHYROIDISM 04/12/2007  . Ischemic cardiomyopathy    cath 35-40%  . MYOCARDIAL INFARCTION, HX OF 04/12/2007  . OBESITY 06/16/2009  . SLEEP APNEA 04/12/2007   CPAP  . Stroke ([place])   . ULCERATIVE COLITIS, LEFT SIDED 11/23/2010  Past Surgical History:  Procedure Laterality Date  . CARDIAC CATHETERIZATION  10/2002   STENT. 2 stents Dr. Maurene Capes  . CARDIAC CATHETERIZATION N/A 10/28/2015   Procedure: Right/Left Heart Cath and Coronary Angiography;  Surgeon: Sherren Mocha, MD;  Location: [facility];  Service: Cardiovascular;  Laterality: N/A;  . CORONARY STENT PLACEMENT  2008   LAD   . RIGHT/LEFT HEART CATH AND CORONARY ANGIOGRAPHY N/A 06/01/2018   Procedure: RIGHT/LEFT HEART CATH AND  CORONARY ANGIOGRAPHY;  Surgeon: Sherren Mocha, MD;  Location: [facility];  Service: Cardiovascular;  Laterality: N/A;  . TONSILLECTOMY      Current Outpatient Medications  Medication Sig Dispense Refill  . acetaminophen (TYLENOL) 500 MG tablet Take 1,000 mg by mouth every 6 (six) hours as needed for headache (pain).    . APRISO 0.375 g 24 hr capsule TAKE 4 CAPSULES BY MOUTH DAILY 120 capsule 5  . atorvastatin (LIPITOR) 20 MG tablet TAKE ONE TABLET (20MG TOTAL) BY MOUTH DAILY 90 tablet 0  . carvedilol (COREG) 25 MG tablet TAKE ONE TABLET BY MOUTH TWICE A DAY (Patient taking differently: Take 25 mg by mouth 2 (two) times daily with a meal. ) 180 tablet 2  . clopidogrel (PLAVIX) 75 MG tablet Take 1 tablet (75 mg total) by mouth daily. (Patient taking differently: Take 75 mg by mouth daily with breakfast. ) 90 tablet 3  . Coenzyme Q10 (COQ10 PO) Take 1 capsule by mouth daily with supper.    . diclofenac Sodium (VOLTAREN) 1 % GEL Apply topically.    . dicyclomine (BENTYL) 10 MG capsule Take 1 capsule (10 mg total) by mouth 4 (four) times daily as needed (Diarrhea or abdominal cramping). 90 capsule 0  . empagliflozin (JARDIANCE) 10 MG TABS tablet TAKE ONE (1) TABLET BY MOUTH EVERY DAY (Patient taking differently: Take 10 mg by mouth daily with breakfast. ) 90 tablet 3  . ENTRESTO 97-103 MG TAKE ONE TABLET BY MOUTH TWICE A DAY 60 tablet 6  . fish oil-omega-3 fatty acids 1000 MG capsule Take 1 g by mouth daily with supper.     . furosemide (LASIX) 40 MG tablet TAKE ONE TABLET BY MOUTH EVERY DAY. 180 tablet 1  . glipiZIDE (GLUCOTROL) 10 MG tablet TAKE ONE TABLET BY MOUTH TWICE A DAY (Patient taking differently: Take 10 mg by mouth 2 (two) times daily before a meal. ) 180 tablet 2  . glucosamine-chondroitin 500-400 MG tablet Take 1 tablet by mouth daily as needed (knee flare up or pain).    Marland Kitchen glucose blood test strip Use as instructed to test 4 time daily 400 each 3  . isosorbide mononitrate  (IMDUR) 60 MG 24 hr tablet TAKE ONE (1) TABLET BY MOUTH EVERY DAY 90 tablet 3  . PRADAXA 150 MG CAPS capsule TAKE ONE CAPSULE BY MOUTH TWICE A DAY 60 capsule 3  . saccharomyces boulardii (FLORASTOR) 250 MG capsule Take 1 capsule (250 mg total) by mouth 2 (two) times daily.    . sitaGLIPtin (JANUVIA) 100 MG tablet TAKE ONE (1) TABLET BY MOUTH EVERY DAY (Patient taking differently: Take 100 mg by mouth daily with breakfast. ) 90 tablet 2  . spironolactone (ALDACTONE) 25 MG tablet TAKE ONE (1) TABLET BY MOUTH EVERY DAY 90 tablet 3  . SYNTHROID 50 MCG tablet TAKE 1 TABLET (50 MG TOTAL) BY MOUTH DAILY BEFORE BREAKFAST (Patient taking differently: Take 50 mcg by mouth daily before breakfast. ) 90 tablet 3   No current facility-administered medications for  this visit.    Allergies as of 11/05/2019 - Review Complete 11/05/2019  Allergen Reaction Noted  . Clindamycin/lincomycin  04/26/2019    Family History  Problem Relation Age of Onset  . Heart attack Mother        mid 69s  . Heart disease Father        H/O CAD, CABG, VALVE SURGERY  . Hypertension Brother   . Obesity Brother   . Stomach cancer Maternal Aunt   . Stomach cancer Paternal Grandmother        ? colon   . Colon cancer Neg Hx     Social History   Socioeconomic History  . Marital status: Married    Spouse name: Not on file  . Number of children: 1  . Years of education: Not on file  . Highest education level: Not on file  Occupational History  . Occupation: RETIRED    Employer: RETIRED  Tobacco Use  . Smoking status: Former Smoker    Packs/day: 1.00    Years: 5.00    Pack years: 5.00    Types: Cigarettes    Quit date: 04/13/1966    Years since quitting: 53.6  . Smokeless tobacco: Never Used  Substance and Sexual Activity  . Alcohol use: No    Alcohol/week: 0.0 standard drinks    Comment: no more beer  . Drug use: No  . Sexual activity: Not on file  Other Topics Concern  . Not on file  Social History Narrative     Cardiorehab 3 days a week. 45 minutes to an hour-stationary bike.    GRANDDAUGHTER (MS. 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       Married for 37 years in 2015, married previously for 14 years. Daughter with first wife and 3 grandkids.    Lives alone with wife. Get to see grandkids a lot. New grandchild in middle of 18      Hobbies-yardwork, previously liked to hunt and fish, does some target shooting   Social Determinants of Health   Financial Resource Strain:   . Difficulty of Paying Living Expenses: Not on file  Food Insecurity:   . Worried About Charity fundraiser in the Last Year: Not on file  . Ran Out of Food in the Last Year: Not on file  Transportation Needs:   . Lack of Transportation (Medical): Not on file  . Lack of Transportation (Non-Medical): Not on file  Physical Activity:   . Days of Exercise per Week: Not on file  . Minutes of Exercise per Session: Not on file  Stress:   . Feeling of Stress : Not on file  Social Connections:   . Frequency of Communication with Friends and Family: Not on file  . Frequency of Social Gatherings with Friends and Family: Not on file  . Attends Religious Services: Not on file  . Active Member of Clubs or Organizations: Not on file  . Attends Archivist Meetings: Not on file  . Marital Status: Not on file    Review of Systems: General: Negative for anorexia, weight loss, fever, chills, fatigue, weakness. ENT: Negative for hoarseness, difficulty swallowing. CV: Negative for chest pain, angina, palpitations, peripheral edema.  Respiratory: Negative for dyspnea at rest, cough, sputum, wheezing.  GI: See history of present illness. Endo: Negative for unusual weight change.  Heme:  Negative for bruising or bleeding. Allergy: Negative for rash or hives.   Physical Exam: BP (!) 146/91   Pulse 71   Temp (!) 97.3 F  (36.3 C)   Ht 5' 8"  (1.727 m)   Wt 239 lb 12.8 oz (108.8 kg)   BMI 36.46 kg/m  General:   Alert and oriented. Pleasant and cooperative. Well-nourished and well-developed.  Eyes:  Without icterus, sclera clear and conjunctiva pink.  Ears:  Normal auditory acuity. Cardiovascular:  S1, S2 present without murmurs appreciated. Extremities without clubbing or edema. Respiratory:  Clear to auscultation bilaterally. No wheezes, rales, or rhonchi. No distress.  Gastrointestinal:  +BS, soft, non-tender and non-distended. No HSM noted. No guarding or rebound. No masses appreciated.  Rectal:  Deferred  Musculoskalatal:  Symmetrical without gross deformities. Skin:  Intact without significant lesions or rashes. Neurologic:  Alert and oriented x4;  grossly normal neurologically. Psych:  Alert and cooperative. Normal mood and affect. Heme/Lymph/Immune: No excessive bruising noted.    11/05/2019 3:25 PM   Disclaimer: This note was dictated with voice recognition software. Similar sounding words can inadvertently be transcribed and may not be corrected upon review.

## 2019-11-05 NOTE — Assessment & Plan Note (Signed)
History of recurrent C. difficile with most recent testing with diarrhea negative for GDH and toxin A/B.  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  He has done quite well on Bentyl.  Is also change in diet eliminate dairy and thinks this is helping.  Still on probiotics.  Recommend he continue his medications as needed, continue probiotics daily.  Follow-up in 6 months.  Call for any worsening or severe symptoms.

## 2019-11-05 NOTE — Assessment & Plan Note (Signed)
History of ulcerative pancolitis currently on Apriso and symptoms well controlled.  Pulmonary can tell, in our system, his last colonoscopy was in 2012 which found ulcerative colitis in remission.  He was recommended to have a 5-year repeat exam I cannot locate any repeat in our system.  He is currently well managed on Apriso with no symptoms.  Now that his diarrhea is improving when he follows up in 6 months we can discuss possible need for one final colonoscopy (years younger than 64).  Continue Apriso for now.  She just got a refill and has not medication on him.  Follow-up in 6 months.

## 2019-11-05 NOTE — Assessment & Plan Note (Signed)
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  Doing well on Bentyl and probiotics.  Continue these.  Continue to avoid lactose.  Follow-up in 6 months and call for any worsening or severe symptoms before then.

## 2019-11-06 ENCOUNTER — Encounter: Payer: Self-pay | Admitting: Internal Medicine

## 2019-11-06 ENCOUNTER — Ambulatory Visit (INDEPENDENT_AMBULATORY_CARE_PROVIDER_SITE_OTHER): Payer: Medicare Other | Admitting: Internal Medicine

## 2019-11-06 DIAGNOSIS — E1151 Type 2 diabetes mellitus with diabetic peripheral angiopathy without gangrene: Secondary | ICD-10-CM

## 2019-11-06 LAB — POCT GLYCOSYLATED HEMOGLOBIN (HGB A1C): Hemoglobin A1C: 5.9 % — AB (ref 4.0–5.6)

## 2019-11-06 MED ORDER — GLIPIZIDE 10 MG PO TABS: 10.0000 mg | ORAL_TABLET | Freq: Two times a day (BID) | ORAL | 3 refills | Status: DC

## 2019-11-06 MED ORDER — JARDIANCE 10 MG PO TABS: ORAL_TABLET | ORAL | 3 refills | Status: DC

## 2019-11-06 MED ORDER — SITAGLIPTIN PHOSPHATE 100 MG PO TABS: ORAL_TABLET | ORAL | 3 refills | Status: DC

## 2019-11-06 MED ORDER — METFORMIN HCL 500 MG PO TABS: 500.0000 mg | ORAL_TABLET | Freq: Two times a day (BID) | ORAL | 3 refills | Status: DC

## 2019-11-06 NOTE — Patient Instructions (Signed)
Please continue: - Glipizide 10 mg 2x a day - Metformin 500 mg 2x a day - Januvia 1000 mg daily  - Jardiance 10 mg before breakfast every day  Please return in 4 months with your sugar log.

## 2019-11-06 NOTE — Progress Notes (Signed)
Patient ID: MONTRAE BRAITHWAITE, male   DOB: [birth year], 74 y.o.   MRN: [id number]  This visit occurred during the SARS-CoV-2 public health emergency.  Safety protocols were in place, including screening questions prior to the visit, additional usage of staff PPE, and extensive cleaning of exam room while observing appropriate contact time as indicated for disinfecting solutions.   HPI: QUADIR MUNS is a 74 y.o.-year-old male, returning for DM2, dx 2004, non-insulin-dependent, uncontrolled, with complications (CAD, s/p MI). Last visit 4 months ago. He has [facility] as supplemental and [employer] for meds in 10/2015.  Since last visit, she tested positive for Covid at the end of 08/2019.  Interestingly, he had no symptoms from it.  He was admitted with C. difficile colitis in 04/2019 after being on antibiotics for dental infection.  He had AKI, elevated blood cell count.  He ended up losing 20 pounds in 2 months.  He adjusted his Metformin dose down to 50% of the dose.  We continued this at last visit.  Reviewed HbA1c levels: Lab Results  Component Value Date   HGBA1C 6.9 (H) 04/26/2019   HGBA1C 7.7 (A) 10/24/2018   HGBA1C 8.8 06/16/2017  02/27/2019: HbA1c calculated from fructosamine is better: 6.45%. 10/24/2018: HbA1c calculated from fructosamine is slightly higher than the one from 02/2018, at 6.6%. 02/13/2018: The HbA1c calculated from the fructosamine is excellent, at 6.3%! 10/16/2017: HbA1c calculated from fructosamine is: 7.3% (higher). 06/16/2017: HbA1c calculated from the fructosamine is stable, at 6.8%.  12/08/2016: HbA1c calculated from the fructosamine is better, at 6.86%. 08/10/2016: HbA1c calculated from  fructosamine is 7.88% 05/10/2016: HbA1c calculated from fructosamine is 7.6% 01/06/2016: HbA1c calculated from fructosamine is 6.6% 09/06/2016: HbA1c calculated from fructosamine is 7.0% 06/01/2015: HbA1c calculated from fructosamine is 7.0% 02/23/2015: HbA1c calculated from  fructosamine is 6.88% 10/15/2014: HbA1c calculated from fructosamine is 7.17% He started to change his eating habits since 03/2013.   He is on: - Glipizide 10 mg 2x a day - Metformin 1000 mg 2x a day >> 500 mg 2x a day (04/2019) - Januvia 100 mg daily  - Jardiance 10 mg in a.m. every other day due to increased urination >> now every day since 02/2019 He does not miss doses.  Pt checks his sugars 4 times a day, per review of his excellent log: - am: 110-138 >> 110-142 >> 116-136 >> 92-118 - 2h after breakfast: 136-165 >> 143-162 >> 120-146, 151 - before lunch 144 >> 110-120s >> n/c  - before dinner: 104-129 >> 102-127 >> 97-117 - 2h after dinner: 136-155 >> 133-164 >> 133-156 >> 116-149 He has hypoglycemia awareness in the 60s. Highest: 287 >> 170 >> 155 >> 165 >> 151  His wife is a Radiographer, therapeutic >> he does eat sweets. He saw [name] (nutritionist).  -+ Mild CKD, last BUN/creatinine:  Lab Results  Component Value Date   BUN 11 09/02/2019   CREATININE 1.00 09/02/2019  On Entresto.  -+ HL:  last set of lipids: Lab Results  Component Value Date   CHOL 81 02/27/2019   HDL 49.20 02/27/2019   LDLCALC 1 02/27/2019   LDLDIRECT 18.0 06/13/2017   TRIG 152.0 (H) 02/27/2019   CHOLHDL 2 02/27/2019  He was in the Reveal Study x 4 years >> stopped. On Lipitor and omega-3 fatty acids.  - last eye exam was 10/2018: No DR ([name]).  + Mild cataracts. Has an appt in 11/2019.  -He denies numbness and tingling in his feet.  He has  a history of OSA-compliant with CPAP, A. Fib-on Plavix and pradaxa, IBD, GERD, vitamin B12 deficiency - previously on im B12, also, obesity.  In 2019 she was also diagnosed with CHF -EF of 25 to 30%.  His Lasix was increased to twice a day(takes the second dose as needed). Cardiac cath >> no significant obstruction.  He was started on Entresto.  Hypothyroidism:  Pt is on levothyroxine 50 mcg daily, taken: - in am - fasting - at least 30 min from  b'fast - no Ca, Fe, MVI, PPIs - not on Biotin  Latest TSH was reviewed and this was normal: Lab Results  Component Value Date   TSH 0.79 09/02/2019   Pt denies: - feeling nodules in neck - hoarseness - dysphagia - choking - SOB with lying down  ROS: Constitutional: no weight gain/+ weight loss, no fatigue, no subjective hyperthermia, no subjective hypothermia Eyes: no blurry vision, no xerophthalmia ENT: no sore throat, + see HPI Cardiovascular: no CP/no SOB/no palpitations/no leg swelling Respiratory: no cough/no SOB/no wheezing Gastrointestinal: no N/no V/no D/no C/no acid reflux Musculoskeletal: no muscle aches/no joint aches Skin: no rashes, no hair loss Neurological: no tremors/no numbness/no tingling/no dizziness  I reviewed pt's medications, allergies, PMH, social hx, family hx, and changes were documented in the history of present illness. Otherwise, unchanged from my initial visit note.  Past Medical History:  Diagnosis Date  . Atrial fibrillation ([place])    initial diagnoses 2012  . B12 deficiency    per patient previously taking shots  . C. difficile diarrhea   . CORONARY ARTERY DISEASE 04/12/2007   2 stents last in 2006.   Marland Kitchen DIABETES MELLITUS, TYPE II 04/16/2007  . Diverticulosis of colon (without mention of hemorrhage)   . HYPERLIPIDEMIA 04/16/2007   reveal study. not sure of medication  . HYPERTENSION 04/12/2007  . HYPOTHYROIDISM 04/12/2007  . Ischemic cardiomyopathy    cath 35-40%  . MYOCARDIAL INFARCTION, HX OF 04/12/2007  . OBESITY 06/16/2009  . SLEEP APNEA 04/12/2007   CPAP  . Stroke ([place])   . ULCERATIVE COLITIS, LEFT SIDED 11/23/2010   Past Surgical History:  Procedure Laterality Date  . CARDIAC CATHETERIZATION  10/2002   STENT. 2 stents Dr. Maurene Capes  . CARDIAC CATHETERIZATION N/A 10/28/2015   Procedure: Right/Left Heart Cath and Coronary Angiography;  Surgeon: Sherren Mocha, MD;  Location: [facility];  Service: Cardiovascular;  Laterality: N/A;  .  CORONARY STENT PLACEMENT  2008   LAD   . RIGHT/LEFT HEART CATH AND CORONARY ANGIOGRAPHY N/A 06/01/2018   Procedure: RIGHT/LEFT HEART CATH AND CORONARY ANGIOGRAPHY;  Surgeon: Sherren Mocha, MD;  Location: [facility];  Service: Cardiovascular;  Laterality: N/A;  . TONSILLECTOMY     Social History   Socioeconomic History  . Marital status: Married    Spouse name: Not on file  . Number of children: 1  . Years of education: Not on file  . Highest education level: Not on file  Occupational History  . Occupation: RETIRED    Employer: RETIRED  Tobacco Use  . Smoking status: Former Smoker    Packs/day: 1.00    Years: 5.00    Pack years: 5.00    Types: Cigarettes    Quit date: 04/13/1966    Years since quitting: 53.6  . Smokeless tobacco: Never Used  Substance and Sexual Activity  . Alcohol use: No    Alcohol/week: 0.0 standard drinks    Comment: no more beer  . Drug use: No  .  Sexual activity: Not on file  Other Topics Concern  . Not on file  Social History Narrative   Cardiorehab 3 days a week. 45 minutes to an hour-stationary bike.    GRANDDAUGHTER (MS. 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       Married for 37 years in 2015, married previously for 14 years. Daughter with first wife and 3 grandkids.    Lives alone with wife. Get to see grandkids a lot. New grandchild in middle of 33      Hobbies-yardwork, previously liked to hunt and fish, does some target shooting   Social Determinants of Health   Financial Resource Strain:   . Difficulty of Paying Living Expenses: Not on file  Food Insecurity:   . Worried About Charity fundraiser in the Last Year: Not on file  . Ran Out of Food in the Last Year: Not on file  Transportation Needs:   . Lack of Transportation (Medical): Not on file  . Lack of Transportation (Non-Medical): Not on file  Physical Activity:   .  Days of Exercise per Week: Not on file  . Minutes of Exercise per Session: Not on file  Stress:   . Feeling of Stress : Not on file  Social Connections:   . Frequency of Communication with Friends and Family: Not on file  . Frequency of Social Gatherings with Friends and Family: Not on file  . Attends Religious Services: Not on file  . Active Member of Clubs or Organizations: Not on file  . Attends Archivist Meetings: Not on file  . Marital Status: Not on file  Intimate Partner Violence:   . Fear of Current or Ex-Partner: Not on file  . Emotionally Abused: Not on file  . Physically Abused: Not on file  . Sexually Abused: Not on file   Current Outpatient Medications on File Prior to Visit  Medication Sig Dispense Refill  . acetaminophen (TYLENOL) 500 MG tablet Take 1,000 mg by mouth every 6 (six) hours as needed for headache (pain).    . APRISO 0.375 g 24 hr capsule TAKE 4 CAPSULES BY MOUTH DAILY 120 capsule 5  . atorvastatin (LIPITOR) 20 MG tablet TAKE ONE TABLET (20MG TOTAL) BY MOUTH DAILY 90 tablet 0  . carvedilol (COREG) 25 MG tablet TAKE ONE TABLET BY MOUTH TWICE A DAY (Patient taking differently: Take 25 mg by mouth 2 (two) times daily with a meal. ) 180 tablet 2  . clopidogrel (PLAVIX) 75 MG tablet Take 1 tablet (75 mg total) by mouth daily. (Patient taking differently: Take 75 mg by mouth daily with breakfast. ) 90 tablet 3  . Coenzyme Q10 (COQ10 PO) Take 1 capsule by mouth daily with supper.    . diclofenac Sodium (VOLTAREN) 1 % GEL Apply topically.    . dicyclomine (BENTYL) 10 MG capsule Take 1 capsule (10 mg total) by mouth 4 (four) times daily as needed (Diarrhea or abdominal cramping). 90 capsule 0  . empagliflozin (JARDIANCE) 10 MG TABS tablet TAKE ONE (1) TABLET BY MOUTH EVERY DAY (Patient taking differently: Take 10 mg by mouth daily with breakfast. ) 90 tablet 3  . ENTRESTO 97-103 MG TAKE ONE TABLET BY MOUTH TWICE A DAY 60 tablet 6  . fish oil-omega-3 fatty  acids 1000 MG capsule Take 1 g by mouth daily with supper.     Marland Kitchen  furosemide (LASIX) 40 MG tablet TAKE ONE TABLET BY MOUTH EVERY DAY. 180 tablet 1  . glipiZIDE (GLUCOTROL) 10 MG tablet TAKE ONE TABLET BY MOUTH TWICE A DAY (Patient taking differently: Take 10 mg by mouth 2 (two) times daily before a meal. ) 180 tablet 2  . glucosamine-chondroitin 500-400 MG tablet Take 1 tablet by mouth daily as needed (knee flare up or pain).    Marland Kitchen glucose blood test strip Use as instructed to test 4 time daily 400 each 3  . isosorbide mononitrate (IMDUR) 60 MG 24 hr tablet TAKE ONE (1) TABLET BY MOUTH EVERY DAY 90 tablet 3  . PRADAXA 150 MG CAPS capsule TAKE ONE CAPSULE BY MOUTH TWICE A DAY 60 capsule 3  . saccharomyces boulardii (FLORASTOR) 250 MG capsule Take 1 capsule (250 mg total) by mouth 2 (two) times daily.    . sitaGLIPtin (JANUVIA) 100 MG tablet TAKE ONE (1) TABLET BY MOUTH EVERY DAY 90 tablet 2  . spironolactone (ALDACTONE) 25 MG tablet TAKE ONE (1) TABLET BY MOUTH EVERY DAY 90 tablet 3  . SYNTHROID 50 MCG tablet TAKE 1 TABLET (50 MG TOTAL) BY MOUTH DAILY BEFORE BREAKFAST (Patient taking differently: Take 50 mcg by mouth daily before breakfast. ) 90 tablet 3   No current facility-administered medications on file prior to visit.   Allergies  Allergen Reactions  . Clindamycin/Lincomycin     Developed C. difficile colitis 1 month after use   Family History  Problem Relation Age of Onset  . Heart attack Mother        mid 20s  . Heart disease Father        H/O CAD, CABG, VALVE SURGERY  . Hypertension Brother   . Obesity Brother   . Stomach cancer Maternal Aunt   . Stomach cancer Paternal Grandmother        ? colon   . Colon cancer Neg Hx     PE: BP 128/82   Pulse 85   Ht 5' 7"  (1.702 m) Comment: measured today without shoes  Wt 238 lb (108 kg)   SpO2 99%   BMI 37.28 kg/m  Body mass index is 37.28 kg/m.  Wt Readings from Last 3 Encounters:  11/06/19 238 lb (108 kg)  11/05/19 239 lb  12.8 oz (108.8 kg)  09/02/19 236 lb 6.4 oz (107.2 kg)   Constitutional: overweight, in NAD Eyes: PERRLA, EOMI, no exophthalmos ENT: moist mucous membranes, no thyromegaly, no cervical lymphadenopathy Cardiovascular: RRR, No MRG, + bilateral lower extremity edema L>R; wears compression pulses Respiratory: CTA B Gastrointestinal: abdomen soft, NT, ND, BS+ Musculoskeletal: no deformities, strength intact in all 4 Skin: moist, warm, no rashes Neurological: no tremor with outstretched hands, DTR normal in all 4   ASSESSMENT: 1. DM2, non-insulin-dependent, uncontrolled, with complications - CAD, s/p MI 10/2006 - s/p stent - sees [name]  2. Obesity class 3 BMI Classification:  < 18.5 underweight   18.5-24.9 normal weight   25.0-29.9 overweight   30.0-34.9 class I obesity   35.0-39.9 class II obesity   ? 40.0 class III obesity   3. HL  4.  Hypothyroidism  PLAN:  1. Patient with longstanding fairly well-controlled diabetes, on oral antidiabetic regimen with sulfonylurea, Metformin, DPP 4 inhibitor and SGLT 2 inhibitor.  Before last visit, he decreased the Metformin dose of that he lost weight following his C. difficile infection.  We did not increase it back at last visit since sugars appeared controlled.  At this visit, they have  improved further.  He does not have low blood sugars.  Therefore, we will continue the current regimen, including glipizide but I am hoping we can reduce or even stop this at next visit. - I advised him to Patient Instructions  Please continue: - Glipizide 10 mg 2x a day - Metformin 500 mg 2x a day - Januvia 1000 mg daily  - Jardiance 10 mg before breakfast every day  Please return in 4 months with your sugar log.   - we checked his HbA1c: 5.9% (lower) - advised to check sugars at different times of the day - 1-2x a day, rotating check times - advised for yearly eye exams >> he is UTD and has an appointment coming up - return to clinic in 4  months      2. Obesity class 3 -Before last visit he lost almost 20 pounds in 2 months after the incident colitis episode -Continue Jardiance which should also help with weight loss -No significant weight loss since last visit  3. HL -Reviewed latest lipid panel from 02/2019: LDL of 1,?  Lab error, triglycerides still high at goal Lab Results  Component Value Date   CHOL 81 02/27/2019   HDL 49.20 02/27/2019   LDLCALC 1 02/27/2019   LDLDIRECT 18.0 06/13/2017   TRIG 152.0 (H) 02/27/2019   CHOLHDL 2 02/27/2019  -Continues Lipitor and omega-3 fatty acids without side effects.  4.  Hypothyroidism - latest thyroid labs reviewed with pt >> normal: Lab Results  Component Value Date   TSH 0.79 09/02/2019   - he continues on LT4 50 mcg daily - pt feels good on this dose. - we discussed about taking the thyroid hormone every day, with water, >30 minutes before breakfast, separated by >4 hours from acid reflux medications, calcium, iron, multivitamins. Pt. is taking it correctly.   Philemon Kingdom, MD PhD [facility]

## 2019-11-06 NOTE — Addendum Note (Signed)
Addended by: Cardell Peach I on: 11/06/2019 10:06 AM   Modules accepted: Orders

## 2019-11-11 DIAGNOSIS — H25813 Combined forms of age-related cataract, bilateral: Secondary | ICD-10-CM | POA: Diagnosis not present

## 2019-11-11 DIAGNOSIS — E119 Type 2 diabetes mellitus without complications: Secondary | ICD-10-CM | POA: Diagnosis not present

## 2019-11-11 DIAGNOSIS — H524 Presbyopia: Secondary | ICD-10-CM | POA: Diagnosis not present

## 2019-11-11 LAB — HM DIABETES EYE EXAM

## 2019-11-20 DIAGNOSIS — H2512 Age-related nuclear cataract, left eye: Secondary | ICD-10-CM | POA: Diagnosis not present

## 2019-11-20 DIAGNOSIS — H2511 Age-related nuclear cataract, right eye: Secondary | ICD-10-CM | POA: Diagnosis not present

## 2019-11-20 DIAGNOSIS — H25012 Cortical age-related cataract, left eye: Secondary | ICD-10-CM | POA: Diagnosis not present

## 2019-11-20 DIAGNOSIS — H25011 Cortical age-related cataract, right eye: Secondary | ICD-10-CM | POA: Diagnosis not present

## 2019-11-27 DIAGNOSIS — H2511 Age-related nuclear cataract, right eye: Secondary | ICD-10-CM | POA: Diagnosis not present

## 2019-11-27 DIAGNOSIS — H25011 Cortical age-related cataract, right eye: Secondary | ICD-10-CM | POA: Diagnosis not present

## 2019-12-05 ENCOUNTER — Other Ambulatory Visit: Payer: Self-pay | Admitting: Internal Medicine

## 2019-12-26 DIAGNOSIS — H35351 Cystoid macular degeneration, right eye: Secondary | ICD-10-CM | POA: Diagnosis not present

## 2019-12-26 DIAGNOSIS — H35352 Cystoid macular degeneration, left eye: Secondary | ICD-10-CM | POA: Diagnosis not present

## 2019-12-26 DIAGNOSIS — G4733 Obstructive sleep apnea (adult) (pediatric): Secondary | ICD-10-CM | POA: Diagnosis not present

## 2019-12-26 DIAGNOSIS — E113393 Type 2 diabetes mellitus with moderate nonproliferative diabetic retinopathy without macular edema, bilateral: Secondary | ICD-10-CM | POA: Diagnosis not present

## 2020-01-02 ENCOUNTER — Ambulatory Visit: Payer: Medicare Other | Attending: Internal Medicine

## 2020-01-02 DIAGNOSIS — Z23 Encounter for immunization: Secondary | ICD-10-CM

## 2020-01-02 NOTE — Progress Notes (Signed)
   Covid-19 Vaccination Clinic  Name:  ENRIQUE MANGANARO    MRN: [id number] DOB: May 07, 1946  01/02/2020  Mr. Minnehan was observed post Covid-19 immunization for 15 minutes without incident. He was provided with Vaccine Information Sheet and instruction to access the V-Safe system.   Mr. Khamis was instructed to call 911 with any severe reactions post vaccine: Marland Kitchen Difficulty breathing  . Swelling of face and throat  . A fast heartbeat  . A bad rash all over body  . Dizziness and weakness   Immunizations Administered    Name Date Dose VIS Date Route   Moderna COVID-19 Vaccine 01/02/2020 12:29 PM 0.5 mL 09/10/2019 Intramuscular   Manufacturer: Moderna   Lot: [id number]   Roy: [id number]

## 2020-01-13 ENCOUNTER — Other Ambulatory Visit: Payer: Self-pay | Admitting: Cardiovascular Disease

## 2020-01-16 ENCOUNTER — Ambulatory Visit (INDEPENDENT_AMBULATORY_CARE_PROVIDER_SITE_OTHER): Payer: Medicare Other | Admitting: Ophthalmology

## 2020-01-16 ENCOUNTER — Encounter (INDEPENDENT_AMBULATORY_CARE_PROVIDER_SITE_OTHER): Payer: Self-pay | Admitting: Ophthalmology

## 2020-01-16 ENCOUNTER — Other Ambulatory Visit: Payer: Self-pay

## 2020-01-16 DIAGNOSIS — H35351 Cystoid macular degeneration, right eye: Secondary | ICD-10-CM

## 2020-01-16 DIAGNOSIS — E113393 Type 2 diabetes mellitus with moderate nonproliferative diabetic retinopathy without macular edema, bilateral: Secondary | ICD-10-CM | POA: Insufficient documentation

## 2020-01-16 DIAGNOSIS — H35352 Cystoid macular degeneration, left eye: Secondary | ICD-10-CM | POA: Insufficient documentation

## 2020-01-16 HISTORY — DX: Cystoid macular degeneration, right eye: H35.351

## 2020-01-16 NOTE — Progress Notes (Signed)
01/16/2020     CHIEF COMPLAINT Patient presents for Retina Follow Up   HISTORY OF PRESENT ILLNESS: [name] is a 74 y.o. male who presents to the clinic today for:   HPI    Retina Follow Up    Patient presents with  Other.  In right eye.  This started 3 weeks ago.  Severity is mild.  Duration of 3 weeks.  Since onset it is gradually improving.          Comments    3 Week Diabetic Exam OU,, and f/u for CME,,  PSEUDOPHAKIC  Pt sts [facility] OD may be slightly clearer x 3 weeks, but it is still cloudy. Pt sts [facility] OS is doing well. Pt c/o difficulty seeing in low light.   Patient has noted marked improvement in the vision in the right eye while using the drops.  The patient also reports restarting CPAP and in fact does feel somewhat better and sleeps a little bit better as well.    LBS: has not checked A1cc: 5.9, 12/2019       Last edited by Hurman Horn, MD on 01/16/2020  4:49 PM. (History)      Referring physician: Marin Olp, [address] [place],  [place] [address]  HISTORICAL INFORMATION:   Selected notes from the MEDICAL RECORD NUMBER    Lab Results  Component Value Date   HGBA1C 5.9 (A) 11/06/2019     CURRENT MEDICATIONS: Current Outpatient Medications (Ophthalmic Drugs)  Medication Sig  . ketorolac (ACULAR) 0.5 % ophthalmic solution   . ofloxacin (OCUFLOX) 0.3 % ophthalmic solution as directed as directed  . prednisoLONE acetate (PRED FORTE) 1 % ophthalmic suspension as directed as directed   No current facility-administered medications for this visit. (Ophthalmic Drugs)   Current Outpatient Medications (Other)  Medication Sig  . acetaminophen (TYLENOL) 500 MG tablet Take 1,000 mg by mouth every 6 (six) hours as needed for headache (pain).  . APRISO 0.375 g 24 hr capsule TAKE 4 CAPSULES BY MOUTH DAILY  . atorvastatin (LIPITOR) 20 MG tablet TAKE ONE TABLET (20MG TOTAL) BY MOUTH DAILY  . carvedilol (COREG) 25 MG tablet TAKE ONE TABLET BY  MOUTH TWICE A DAY  . clopidogrel (PLAVIX) 75 MG tablet Take 1 tablet (75 mg total) by mouth daily. (Patient taking differently: Take 75 mg by mouth daily with breakfast. )  . Coenzyme Q10 (COQ10 PO) Take 1 capsule by mouth daily with supper.  . diclofenac Sodium (VOLTAREN) 1 % GEL Apply topically.  . dicyclomine (BENTYL) 10 MG capsule Take 1 capsule (10 mg total) by mouth 4 (four) times daily as needed (Diarrhea or abdominal cramping).  Marland Kitchen DIFICID 200 MG TABS tablet   . empagliflozin (JARDIANCE) 10 MG TABS tablet TAKE ONE (1) TABLET BY MOUTH EVERY DAY  . ENTRESTO 97-103 MG TAKE ONE TABLET BY MOUTH TWICE A DAY  . fish oil-omega-3 fatty acids 1000 MG capsule Take 1 g by mouth daily with supper.   . furosemide (LASIX) 40 MG tablet TAKE ONE TABLET BY MOUTH EVERY DAY.  Marland Kitchen glipiZIDE (GLUCOTROL) 10 MG tablet Take 1 tablet (10 mg total) by mouth 2 (two) times daily before a meal.  . glucosamine-chondroitin 500-400 MG tablet Take 1 tablet by mouth daily as needed (knee flare up or pain).  Marland Kitchen glucose blood test strip Use as instructed to test 4 time daily  . isosorbide mononitrate (IMDUR) 60 MG 24 hr tablet TAKE ONE (1) TABLET BY  MOUTH EVERY DAY  . metFORMIN (GLUCOPHAGE) 500 MG tablet Take 1 tablet (500 mg total) by mouth 2 (two) times daily with a meal.  . PRADAXA 150 MG CAPS capsule TAKE ONE CAPSULE BY MOUTH TWICE A DAY  . saccharomyces boulardii (FLORASTOR) 250 MG capsule Take 1 capsule (250 mg total) by mouth 2 (two) times daily.  . sitaGLIPtin (JANUVIA) 100 MG tablet TAKE ONE (1) TABLET BY MOUTH EVERY DAY  . spironolactone (ALDACTONE) 25 MG tablet TAKE ONE (1) TABLET BY MOUTH EVERY DAY  . SYNTHROID 50 MCG tablet TAKE ONE TABLET BY MOUTH DAILY BEFORE BREAKFAST   No current facility-administered medications for this visit. (Other)      REVIEW OF SYSTEMS:    ALLERGIES Allergies  Allergen Reactions  . Clindamycin/Lincomycin     Developed C. difficile colitis 1 month after use    PAST MEDICAL  HISTORY Past Medical History:  Diagnosis Date  . Atrial fibrillation ([place])    initial diagnoses 2012  . B12 deficiency    per patient previously taking shots  . C. difficile diarrhea   . CORONARY ARTERY DISEASE 04/12/2007   2 stents last in 2006.   Marland Kitchen DIABETES MELLITUS, TYPE II 04/16/2007  . Diverticulosis of colon (without mention of hemorrhage)   . HYPERLIPIDEMIA 04/16/2007   reveal study. not sure of medication  . HYPERTENSION 04/12/2007  . HYPOTHYROIDISM 04/12/2007  . Ischemic cardiomyopathy    cath 35-40%  . MYOCARDIAL INFARCTION, HX OF 04/12/2007  . OBESITY 06/16/2009  . SLEEP APNEA 04/12/2007   CPAP  . Stroke ([place])   . ULCERATIVE COLITIS, LEFT SIDED 11/23/2010   Past Surgical History:  Procedure Laterality Date  . CARDIAC CATHETERIZATION  10/2002   STENT. 2 stents Dr. Maurene Capes  . CARDIAC CATHETERIZATION N/A 10/28/2015   Procedure: Right/Left Heart Cath and Coronary Angiography;  Surgeon: Sherren Mocha, MD;  Location: [facility];  Service: Cardiovascular;  Laterality: N/A;  . CORONARY STENT PLACEMENT  2008   LAD   . RIGHT/LEFT HEART CATH AND CORONARY ANGIOGRAPHY N/A 06/01/2018   Procedure: RIGHT/LEFT HEART CATH AND CORONARY ANGIOGRAPHY;  Surgeon: Sherren Mocha, MD;  Location: [facility];  Service: Cardiovascular;  Laterality: N/A;  . TONSILLECTOMY      FAMILY HISTORY Family History  Problem Relation Age of Onset  . Heart attack Mother        mid 61s  . Heart disease Father        H/O CAD, CABG, VALVE SURGERY  . Hypertension Brother   . Obesity Brother   . Stomach cancer Maternal Aunt   . Stomach cancer Paternal Grandmother        ? colon   . Colon cancer Neg Hx     SOCIAL HISTORY Social History   Tobacco Use  . Smoking status: Former Smoker    Packs/day: 1.00    Years: 5.00    Pack years: 5.00    Types: Cigarettes    Quit date: 04/13/1966    Years since quitting: 53.7  . Smokeless tobacco: Never Used  Substance Use Topics  . Alcohol use: No     Alcohol/week: 0.0 standard drinks    Comment: no more beer  . Drug use: No         OPHTHALMIC EXAM:  Base Eye Exam    Visual Acuity (Snellen - Linear)      Right Left   Dist [place] 20/25 -1 20/20 -2       Tonometry (Tonopen, 3:18  PM)      Right Left   Pressure 14 15       Pupils      Pupils Dark Light Shape React APD   Right PERRL 4 3 Round Brisk None   Left PERRL 4 3 Round Brisk None       Visual Fields (Counting fingers)      Left Right    Full Full       Extraocular Movement      Right Left    Full Full       Neuro/Psych    Oriented x3: Yes   Mood/Affect: Normal       Dilation    Both eyes: 1.0% Mydriacyl, 2.5% Phenylephrine @ 3:18 PM        Slit Lamp and Fundus Exam    External Exam      Right Left   External Normal Normal       Slit Lamp Exam      Right Left   Lids/Lashes Normal Normal   Conjunctiva/Sclera White and quiet White and quiet   Cornea Clear Clear   Anterior Chamber Deep and quiet Deep and quiet   Iris Round and reactive Round and reactive   Lens Posterior chamber intraocular lens Posterior chamber intraocular lens   Vitreous Normal Normal       Fundus Exam      Right Left   Disc Normal Normal   C/D Ratio 0.1 0.1   Macula Normal Normal   Vessels Retinopathy moderate NPDR Retinopathy moderate NPDR   Periphery Normal Normal          IMAGING AND PROCEDURES  Imaging and Procedures for @TODAY @  OCT, Retina - OU - Both Eyes       Right Eye Scan locations included subfoveal. Progression has improved. Findings include normal foveal contour.   Left Eye Quality was good. Scan locations included subfoveal. Findings include normal foveal contour.   Notes Massive CME presents some 3 weeks previous has completely resolved in the right eye using topical NSAIDs alone  Normal foveal contour now in each eye.. Mild CME in the left eye is also improved  Will curtail use of NSAID OD to twice daily and discontinue use OS                 ASSESSMENT/PLAN:  @PROBAPNOTE @    ICD-10-CM   1. Cystoid macular edema of right eye  H35.351 OCT, Retina - OU - Both Eyes  2. Cystoid macular edema of left eye  H35.352   3. Moderate nonproliferative diabetic retinopathy of both eyes without macular edema associated with type 2 diabetes mellitus ([place])  E31.5400     1.  2.  3.  Ophthalmic Meds Ordered this visit:  No orders of the defined types were placed in this encounter.      Return in about 3 months (around 04/16/2020) for DILATE OU, OCT.  Patient Instructions  Massive CME presents some 3 weeks previous has completely resolved in the right eye using topical NSAIDs alone  Normal foveal contour now in each eye.. Mild CME in the left eye is also improved  Will curtail use of NSAID OD to twice daily and discontinue use OS  The nature of cystoid macular edema including causes, exacerbating factors, and treatments including steroid and nonsteroidal anti-inflammatory drops, periocular injections of steroids, and intravitreal injections of steroids were reviewed. An informational brochure was offered.  The patient's questions were answered. The potential side effects of  the various treatments were reviewed including the potential for intraocular pressure rise with steroid treatments.  I often use topical NSAIDS alone or in conjunction with periocular steroids to resolve this condition.   Patient instructed not to compress or "rub" on the eye.  More rarely, surgery may be considered if the condition is not improving.    Explained the diagnoses, plan, and follow up with the patient and they expressed understanding.  Patient expressed understanding of the importance of proper follow up care.   Clent Demark Kacia Halley M.D. Diseases & Surgery of the Retina and Vitreous Retina & Diabetic [place] @TODAY @     Abbreviations: M myopia (nearsighted); A astigmatism; H hyperopia (farsighted); P presbyopia; Mrx spectacle  prescription;  CTL contact lenses; OD right eye; OS left eye; OU both eyes  XT exotropia; ET esotropia; PEK punctate epithelial keratitis; PEE punctate epithelial erosions; DES dry eye syndrome; MGD meibomian gland dysfunction; ATs artificial tears; PFAT's preservative free artificial tears; [place] nuclear sclerotic cataract; PSC posterior subcapsular cataract; ERM epi-retinal membrane; PVD posterior vitreous detachment; RD retinal detachment; DM diabetes mellitus; DR diabetic retinopathy; NPDR non-proliferative diabetic retinopathy; PDR proliferative diabetic retinopathy; CSME clinically significant macular edema; DME diabetic macular edema; dbh dot blot hemorrhages; CWS cotton wool spot; POAG primary open angle glaucoma; C/D cup-to-disc ratio; HVF humphrey visual field; GVF goldmann visual field; OCT optical coherence tomography; IOP intraocular pressure; BRVO Branch retinal vein occlusion; CRVO central retinal vein occlusion; CRAO central retinal artery occlusion; BRAO branch retinal artery occlusion; RT retinal tear; SB scleral buckle; PPV pars plana vitrectomy; VH Vitreous hemorrhage; PRP panretinal laser photocoagulation; IVK intravitreal kenalog; VMT vitreomacular traction; MH Macular hole;  NVD neovascularization of the disc; NVE neovascularization elsewhere; AREDS age related eye disease study; ARMD age related macular degeneration; POAG primary open angle glaucoma; EBMD epithelial/anterior basement membrane dystrophy; ACIOL anterior chamber intraocular lens; IOL intraocular lens; PCIOL posterior chamber intraocular lens; Phaco/IOL phacoemulsification with intraocular lens placement; [place] photorefractive keratectomy; LASIK laser assisted in situ keratomileusis; HTN hypertension; DM diabetes mellitus; COPD chronic obstructive pulmonary disease

## 2020-01-16 NOTE — Patient Instructions (Signed)
Massive CME presents some 3 weeks previous has completely resolved in the right eye using topical NSAIDs alone  Normal foveal contour now in each eye.. Mild CME in the left eye is also improved  Will curtail use of NSAID OD to twice daily and discontinue use OS  The nature of cystoid macular edema including causes, exacerbating factors, and treatments including steroid and nonsteroidal anti-inflammatory drops, periocular injections of steroids, and intravitreal injections of steroids were reviewed. An informational brochure was offered.  The patient's questions were answered. The potential side effects of the various treatments were reviewed including the potential for intraocular pressure rise with steroid treatments.  I often use topical NSAIDS alone or in conjunction with periocular steroids to resolve this condition.   Patient instructed not to compress or "rub" on the eye.  More rarely, surgery may be considered if the condition is not improving.

## 2020-02-04 ENCOUNTER — Ambulatory Visit: Payer: Medicare Other | Attending: Internal Medicine

## 2020-02-04 DIAGNOSIS — Z23 Encounter for immunization: Secondary | ICD-10-CM

## 2020-02-04 NOTE — Progress Notes (Signed)
   Covid-19 Vaccination Clinic  Name:  [name]    MRN: [id number] DOB: [birth year]  02/04/2020  [name] was observed post Covid-19 immunization for 15 minutes without incident. He was provided with Vaccine Information Sheet and instruction to access the V-Safe system.   [name] was instructed to call 911 with any severe reactions post vaccine: Marland Kitchen Difficulty breathing  . Swelling of face and throat  . A fast heartbeat  . A bad rash all over body  . Dizziness and weakness   Immunizations Administered    Name Date Dose VIS Date Route   Moderna COVID-19 Vaccine 02/04/2020 10:02 AM 0.5 mL 09/2019 Intramuscular   Manufacturer: Moderna   Lot: [id number]   [place]: [id number]

## 2020-02-07 ENCOUNTER — Other Ambulatory Visit: Payer: Self-pay | Admitting: Internal Medicine

## 2020-02-07 ENCOUNTER — Other Ambulatory Visit: Payer: Self-pay | Admitting: Cardiovascular Disease

## 2020-02-07 MED ORDER — DABIGATRAN ETEXILATE MESYLATE 150 MG PO CAPS: 150.0000 mg | ORAL_CAPSULE | Freq: Two times a day (BID) | ORAL | 5 refills | Status: DC

## 2020-02-07 NOTE — Telephone Encounter (Signed)
Pradaxa 161m refill request received. Pt is 74years old, weight-108 kg, Crea-1.00 on 09/02/2019, last seen by Dr. ARayann Hemanon 11/19/2018 and OV states to f/u as needed and per Dr. BHaroldine LawsOV note on 09/12/2018 pt was due to follow up in June 2020-NEEDS AN APPT WITH A CARDIOLOGIST, Diagnosis-Afib, CrCl-912mmin; Dose is appropriate based on dosing criteria but needs an appt with a Cardiology Provider. Last refill was sent on 09/30/2019 and a note was placed that pt needs an appt. Will await a callback.

## 2020-02-07 NOTE — Telephone Encounter (Signed)
See separate phone note - refill sent in, pt has scheduled f/u appt with Dr Burt Knack in May.

## 2020-02-07 NOTE — Telephone Encounter (Signed)
New Message   *STAT* If patient is at the pharmacy, call can be transferred to refill team.   1. Which medications need to be refilled? (please list name of each medication and dose if known) PRADAXA 150 MG CAPS capsule  2. Which pharmacy/location (including street and city if local pharmacy) is medication to be sent to? [place]  3. Do they need a 30 day or 90 day supply? 30 day  Appt scheduled with [name] on 05/17/2, and needs refill to cover until then. Out of medication.

## 2020-02-07 NOTE — Telephone Encounter (Signed)
Age 74, weight 108kg, SCr 1 on 09/02/19, CrCl 31m/min. Last OV Feb 2020 but scheduled for follow up in May 2021, afib indication. Refill sent in.

## 2020-02-24 ENCOUNTER — Ambulatory Visit: Payer: Medicare Other | Admitting: Cardiovascular Disease

## 2020-02-24 ENCOUNTER — Other Ambulatory Visit: Payer: Self-pay

## 2020-02-24 ENCOUNTER — Ambulatory Visit (INDEPENDENT_AMBULATORY_CARE_PROVIDER_SITE_OTHER): Payer: Medicare Other | Admitting: Family Medicine

## 2020-02-24 ENCOUNTER — Encounter: Payer: Self-pay | Admitting: Family Medicine

## 2020-02-24 VITALS — BP 138/80 | HR 68 | Temp 98.9°F | Ht 67.0 in | Wt 244.6 lb

## 2020-02-24 DIAGNOSIS — E785 Hyperlipidemia, unspecified: Secondary | ICD-10-CM

## 2020-02-24 DIAGNOSIS — E538 Deficiency of other specified B group vitamins: Secondary | ICD-10-CM

## 2020-02-24 DIAGNOSIS — I10 Essential (primary) hypertension: Secondary | ICD-10-CM

## 2020-02-24 DIAGNOSIS — I25118 Atherosclerotic heart disease of native coronary artery with other forms of angina pectoris: Secondary | ICD-10-CM | POA: Diagnosis not present

## 2020-02-24 DIAGNOSIS — E039 Hypothyroidism, unspecified: Secondary | ICD-10-CM | POA: Diagnosis not present

## 2020-02-24 DIAGNOSIS — I5022 Chronic systolic (congestive) heart failure: Secondary | ICD-10-CM

## 2020-02-24 DIAGNOSIS — E1151 Type 2 diabetes mellitus with diabetic peripheral angiopathy without gangrene: Secondary | ICD-10-CM | POA: Diagnosis not present

## 2020-02-24 DIAGNOSIS — I4821 Permanent atrial fibrillation: Secondary | ICD-10-CM

## 2020-02-24 MED ORDER — ATORVASTATIN CALCIUM 20 MG PO TABS: ORAL_TABLET | ORAL | 3 refills | Status: DC

## 2020-02-24 NOTE — Progress Notes (Signed)
Phone [phone] In person visit   Subjective:   Andrew GETER is a 74 y.o. year old very pleasant male patient who presents for/with See problem oriented charting Chief Complaint  Patient presents with  . Hypertension    This visit occurred during the SARS-CoV-2 public health emergency.  Safety protocols were in place, including screening questions prior to the visit, additional usage of staff PPE, and extensive cleaning of exam room while observing appropriate contact time as indicated for disinfecting solutions.   Past Medical History-  Patient Active Problem List   Diagnosis Date Noted  . History of transient ischemic attack (TIA) 10/24/2015    Priority: High  . CHF (congestive heart failure), NYHA class II ([place]) 05/09/2014    Priority: High  . Ulcerative pancolitis without complication ([place]) 47/06/6282    Priority: High  . Atrial fibrillation ([place]) 12/08/2010    Priority: High  . Type II diabetes mellitus with peripheral circulatory disorder ([place]) 04/16/2007    Priority: High  . CAD (coronary artery disease), native coronary artery 04/12/2007    Priority: High  . Morbid obesity ([place]) 08/09/2011    Priority: Medium  . Hyperlipidemia 04/16/2007    Priority: Medium  . Hypothyroidism 04/12/2007    Priority: Medium  . Essential hypertension 04/12/2007    Priority: Medium  . Obstructive sleep apnea 04/12/2007    Priority: Medium  . Former smoker 08/11/2016    Priority: Low  . MCI (mild cognitive impairment) 01/19/2016    Priority: Low  . Primary osteoarthritis of left knee 02/09/2015    Priority: Low  . Allergic rhinitis 06/25/2012    Priority: Low  . GERD (gastroesophageal reflux disease) 08/09/2011    Priority: Low  . VITAMIN B12 DEFICIENCY 11/29/2010    Priority: Low  . Cystoid macular edema of right eye 01/16/2020  . Cystoid macular edema of left eye 01/16/2020  . Moderate nonproliferative diabetic retinopathy of both eyes ([place]) 01/16/2020  .  Atherosclerotic heart disease of native coronary artery with other forms of angina pectoris ([place]) 04/26/2019  . C. difficile colitis 04/17/2019  . Dehydration 04/15/2019  . Diarrhea 04/15/2019  . ARF (acute renal failure) ([place]) 04/15/2019  . Chronic combined systolic and diastolic CHF (congestive heart failure) ([place]) 04/15/2019  . Hypokalemia 04/15/2019  . AKI (acute kidney injury) ([place])   . Acute on chronic combined systolic and diastolic CHF (congestive heart failure) ([place]) 02/07/2018    Medications- reviewed and updated Current Outpatient Medications  Medication Sig Dispense Refill  . acetaminophen (TYLENOL) 500 MG tablet Take 1,000 mg by mouth every 6 (six) hours as needed for headache (pain).    . APRISO 0.375 g 24 hr capsule TAKE 4 CAPSULES BY MOUTH DAILY 120 capsule 5  . atorvastatin (LIPITOR) 20 MG tablet TAKE ONE TABLET (20MG TOTAL) BY MOUTH DAILY 90 tablet 0  . carvedilol (COREG) 25 MG tablet TAKE ONE TABLET BY MOUTH TWICE A DAY 180 tablet 3  . clopidogrel (PLAVIX) 75 MG tablet Take 1 tablet (75 mg total) by mouth daily. (Patient taking differently: Take 75 mg by mouth daily with breakfast. ) 90 tablet 3  . Coenzyme Q10 (COQ10 PO) Take 1 capsule by mouth daily with supper.    . dabigatran (PRADAXA) 150 MG CAPS capsule Take 1 capsule (150 mg total) by mouth 2 (two) times daily. 60 capsule 5  . diclofenac Sodium (VOLTAREN) 1 % GEL Apply topically.    . dicyclomine (BENTYL) 10 MG capsule Take 1 capsule (10 mg total) by  mouth 4 (four) times daily as needed (Diarrhea or abdominal cramping). 90 capsule 0  . DIFICID 200 MG TABS tablet     . empagliflozin (JARDIANCE) 10 MG TABS tablet TAKE ONE (1) TABLET BY MOUTH EVERY DAY 90 tablet 3  . ENTRESTO 97-103 MG TAKE ONE TABLET BY MOUTH TWICE A DAY 60 tablet 6  . fish oil-omega-3 fatty acids 1000 MG capsule Take 1 g by mouth daily with supper.     . furosemide (LASIX) 40 MG tablet TAKE ONE TABLET BY MOUTH EVERY DAY. 180 tablet 1  . glipiZIDE  (GLUCOTROL) 10 MG tablet Take 1 tablet (10 mg total) by mouth 2 (two) times daily before a meal. 180 tablet 3  . glucosamine-chondroitin 500-400 MG tablet Take 1 tablet by mouth daily as needed (knee flare up or pain).    Marland Kitchen glucose blood test strip Use as instructed to test 4 time daily 400 each 3  . isosorbide mononitrate (IMDUR) 60 MG 24 hr tablet TAKE ONE (1) TABLET BY MOUTH EVERY DAY 90 tablet 3  . ketorolac (ACULAR) 0.5 % ophthalmic solution     . metFORMIN (GLUCOPHAGE) 500 MG tablet Take 1 tablet (500 mg total) by mouth 2 (two) times daily with a meal. 180 tablet 3  . ofloxacin (OCUFLOX) 0.3 % ophthalmic solution as directed as directed    . prednisoLONE acetate (PRED FORTE) 1 % ophthalmic suspension as directed as directed    . saccharomyces boulardii (FLORASTOR) 250 MG capsule Take 1 capsule (250 mg total) by mouth 2 (two) times daily.    . sitaGLIPtin (JANUVIA) 100 MG tablet TAKE ONE (1) TABLET BY MOUTH EVERY DAY 90 tablet 3  . spironolactone (ALDACTONE) 25 MG tablet TAKE ONE (1) TABLET BY MOUTH EVERY DAY 90 tablet 3  . SYNTHROID 50 MCG tablet TAKE ONE TABLET BY MOUTH DAILY BEFORE BREAKFAST 90 tablet 3   No current facility-administered medications for this visit.     Objective:  BP 138/80   Pulse 68   Temp 98.9 F (37.2 C) (Temporal)   Ht 5' 7"  (1.702 m)   Wt 244 lb 9.6 oz (110.9 kg)   SpO2 97%   BMI 38.31 kg/m  Gen: NAD, resting comfortably CV: irregularly irregular no murmurs rubs or gallops Lungs: CTAB no crackles, wheeze, rhonchi Ext: trace edema left greater than right Skin: warm, dry Neuro: grossly normal, moves all extremities    Assessment and Plan   #%CAD/atrial fibrillation/hyperlipidemia. Follows with Dr. Rayann Heman of cardiology S: Compliant with atorvastatin 73m-LDL has been at goal under 749 I have prescribed his Plavix-with his CAD history-he is compliant with this .  He is also on Pradaxa for anticoagulation due to atrial fibrillation-reports compliance .   Patient reports cardiology wants him on both medications- he has never had bleeding issues. Patient is not on rate control medication but remains rate controlled  Patient denies chest pain on Imdur  A/P: Asymptomatic on current medications.  Continue Pradaxa to reduce the risk of stroke.  Continue Plavix to reduce the risk of heart disease worsening.  For lipids- update lipids and likely continue current dose- LDL ideally under 55.   For a fib- permanent- no rate control needed at this point. Continue pradaxa as above  #hypertension S: Compliant with Coreg 25 mg twice a day, Imdur 60 mg, Entresto, spironolactone 25 mg, Lasix 40 mg.  BP Readings from Last 3 Encounters:  02/24/20 138/80  11/06/19 128/82  11/05/19 (!) 146/91   A/P:  Reasonable  control-continue current medication  #% Hypothyroidism S: Compliant with Synthroid 58 mcg-most recently filled by Dr. Cruzita Lederer Lab Results  Component Value Date   TSH 0.79 09/02/2019  A/P: Well-controlled on last check-continue current medication. Update TSH - I am willing to prescribe Synthroid if needed   % Ulcerative colitis-followed by GI- compliant with mesalamine.  He also takes Publishing copy and Bentyl  % Diabetes-followed by Dr. Cruzita Lederer #Morbid obesity S:Compliant with glipizide 10 mg twice daily, Jardiance 10 mg, Januvia 100 mg -he stopped the metformin due to occasional diarrhea and thinking a1c would be ok   Lab Results  Component Value Date   HGBA1C 5.9 (A) 11/06/2019   HGBA1C 6.9 (H) 04/26/2019   HGBA1C 7.7 (A) 10/24/2018   Lab Results  Component Value Date   MICROALBUR 2.2 (H) 02/27/2019   LDLCALC 1 02/27/2019   CREATININE 1.00 09/02/2019    For obesity-unfortunately weight has trended up 6 pounds from January Wt Readings from Last 3 Encounters:  02/24/20 244 lb 9.6 oz (110.9 kg)  11/06/19 238 lb (108 kg)  11/05/19 239 lb 12.8 oz (108.8 kg)   A/P:  Hopefully a1c still controlled- we can forward to Dr. Cruzita Lederer- hoping with  him stopping metformin his #s havent trended up.   For obesity- only up 1 lb from last in office visit. He has tried to pull off breads.  states needs to pull off the easter candy- hopefully hasnt messed up a1c too much  # B12 deficiency S: Current treatment/medication (oral vs. IM):  Does not take  Lab Results  Component Value Date   VITAMINB12 267 05/01/2012  A/P: history b12 deficiency listed from 2012- we will update b12 level and see if he needs a supplement  Recommended follow up:  Future Appointments  Date Time Provider [place]  03/02/2020  9:40 AM Philemon Kingdom, MD [facility] None  04/08/2020  3:45 PM Richardson Dopp T, PA-C [facility]  04/15/2020 10:45 AM Rankin, Clent Demark, MD [facility] None  05/12/2020 10:00 AM Carlis Stable, NP [facility]   Lab/Order associations:   ICD-10-CM   1. Essential hypertension  I10   2. Chronic systolic congestive heart failure, NYHA class 2 (HCC)  I50.22   3. Permanent atrial fibrillation (HCC) Chronic I48.21   4. Morbid obesity (HCC) Chronic E66.01   5. Atherosclerotic heart disease of native coronary artery with other forms of angina pectoris (HCC) Chronic I25.118   6. Hyperlipidemia, unspecified hyperlipidemia type  E78.5 CBC with Differential/Platelet    Comprehensive metabolic panel    Lipid panel  7. Hypothyroidism, unspecified type  E03.9 TSH  8. Hyperlipidemia  E78.5 atorvastatin (LIPITOR) 20 MG tablet  9. Type II diabetes mellitus with peripheral circulatory disorder (HCC)  E11.51 Hemoglobin A1c  10. B12 deficiency  E53.8 Vitamin B12    Meds ordered this encounter  Medications  . atorvastatin (LIPITOR) 20 MG tablet    Sig: TAKE ONE TABLET (20MG TOTAL) BY MOUTH DAILY    Dispense:  90 tablet    Refill:  3   Return precautions advised.  Garret Reddish, MD

## 2020-02-24 NOTE — Patient Instructions (Addendum)
Please stop by lab before you go If you have mychart- we will send your results within 3 business days of [place] receiving them.  If you do not have mychart- we will call you about results within 5 business days of [place] receiving them.   No changes today-I did refill your atorvastatin 20 mg-please double check and make sure you have been taking this-from the refill dates it looks like it is possible you were out of the medicine  Let Dr. Cruzita Lederer know we already checked thyroid and a1c today  Recommended follow up: Return in about 6 months (around 08/26/2020) for follow up- or sooner if needed.

## 2020-02-25 LAB — COMPREHENSIVE METABOLIC PANEL
ALT: 8 U/L (ref 0–53)
AST: 13 U/L (ref 0–37)
Albumin: 4.2 g/dL (ref 3.5–5.2)
Alkaline Phosphatase: 83 U/L (ref 39–117)
BUN: 21 mg/dL (ref 6–23)
CO2: 33 mEq/L — ABNORMAL HIGH (ref 19–32)
Calcium: 8.9 mg/dL (ref 8.4–10.5)
Chloride: 103 mEq/L (ref 96–112)
Creatinine, Ser: 1.37 mg/dL (ref 0.40–1.50)
GFR: 50.78 mL/min — ABNORMAL LOW (ref >60.00)
Glucose, Bld: 109 mg/dL — ABNORMAL HIGH (ref 70–99)
Potassium: 4.6 mEq/L (ref 3.5–5.1)
Sodium: 141 mEq/L (ref 135–145)
Total Bilirubin: 0.9 mg/dL (ref 0.2–1.2)
Total Protein: 6.6 g/dL (ref 6.0–8.3)

## 2020-02-25 LAB — CBC WITH DIFFERENTIAL/PLATELET
Basophils Absolute: 0 10*3/uL (ref 0.0–0.1)
Basophils Relative: 0.4 % (ref 0.0–3.0)
Eosinophils Absolute: 0.1 10*3/uL (ref 0.0–0.7)
Eosinophils Relative: 1 % (ref 0.0–5.0)
HCT: 43.5 % (ref 39.0–52.0)
Hemoglobin: 14 g/dL (ref 13.0–17.0)
Lymphocytes Relative: 11.2 % — ABNORMAL LOW (ref 12.0–46.0)
Lymphs Abs: 0.9 10*3/uL (ref 0.7–4.0)
MCHC: 32.2 g/dL (ref 30.0–36.0)
MCV: 76.9 fl — ABNORMAL LOW (ref 78.0–100.0)
Monocytes Absolute: 0.6 10*3/uL (ref 0.1–1.0)
Monocytes Relative: 7.2 % (ref 3.0–12.0)
Neutro Abs: 6.5 10*3/uL (ref 1.4–7.7)
Neutrophils Relative %: 80.2 % — ABNORMAL HIGH (ref 43.0–77.0)
Platelets: 182 10*3/uL (ref 150.0–400.0)
RBC: 5.65 Mil/uL (ref 4.22–5.81)
RDW: 17.1 % — ABNORMAL HIGH (ref 11.5–15.5)
WBC: 8.2 10*3/uL (ref 4.0–10.5)

## 2020-02-25 LAB — LIPID PANEL
Cholesterol: 84 mg/dL (ref 0–200)
HDL: 51.6 mg/dL (ref >39.00)
LDL Cholesterol: 12 mg/dL (ref 0–99)
NonHDL: 32.71
Total CHOL/HDL Ratio: 2
Triglycerides: 105 mg/dL (ref 0.0–149.0)
VLDL: 21 mg/dL (ref 0.0–40.0)

## 2020-02-25 LAB — HEMOGLOBIN A1C: Hgb A1c MFr Bld: 6.8 % — ABNORMAL HIGH (ref 4.6–6.5)

## 2020-02-26 ENCOUNTER — Other Ambulatory Visit: Payer: Self-pay

## 2020-02-26 DIAGNOSIS — R718 Other abnormality of red blood cells: Secondary | ICD-10-CM

## 2020-02-26 DIAGNOSIS — R944 Abnormal results of kidney function studies: Secondary | ICD-10-CM

## 2020-02-26 DIAGNOSIS — E538 Deficiency of other specified B group vitamins: Secondary | ICD-10-CM

## 2020-02-26 LAB — TSH: TSH: 1.45 u[IU]/mL (ref 0.35–4.50)

## 2020-02-26 LAB — VITAMIN B12: Vitamin B-12: 103 pg/mL — ABNORMAL LOW (ref 211–911)

## 2020-02-27 ENCOUNTER — Other Ambulatory Visit: Payer: Self-pay

## 2020-03-02 ENCOUNTER — Ambulatory Visit (INDEPENDENT_AMBULATORY_CARE_PROVIDER_SITE_OTHER): Payer: Medicare Other | Admitting: Internal Medicine

## 2020-03-02 ENCOUNTER — Other Ambulatory Visit: Payer: Self-pay

## 2020-03-02 ENCOUNTER — Encounter: Payer: Self-pay | Admitting: Internal Medicine

## 2020-03-02 VITALS — BP 110/60 | HR 57 | Ht 67.0 in | Wt 247.6 lb

## 2020-03-02 DIAGNOSIS — I25118 Atherosclerotic heart disease of native coronary artery with other forms of angina pectoris: Secondary | ICD-10-CM

## 2020-03-02 DIAGNOSIS — E1151 Type 2 diabetes mellitus with diabetic peripheral angiopathy without gangrene: Secondary | ICD-10-CM

## 2020-03-02 DIAGNOSIS — E785 Hyperlipidemia, unspecified: Secondary | ICD-10-CM | POA: Diagnosis not present

## 2020-03-02 DIAGNOSIS — E039 Hypothyroidism, unspecified: Secondary | ICD-10-CM

## 2020-03-02 NOTE — Progress Notes (Signed)
Patient ID: [name], male   DOB: 1945-10-14, 74 y.o.   MRN: [id number]  This visit occurred during the SARS-CoV-2 public health emergency.  Safety protocols were in place, including screening questions prior to the visit, additional usage of staff PPE, and extensive cleaning of exam room while observing appropriate contact time as indicated for disinfecting solutions.   HPI: [name] is a 74 y.o.-year-old male, returning for DM2, dx 2004, non-insulin-dependent, uncontrolled, with complications (CAD, s/p MI). Last visit 4 months ago. He has [facility] as supplemental and [employer] for meds in 10/2015.  He just returned from the beach from his granddaughter's wedding.  Reviewed HbA1c levels: Lab Results  Component Value Date   HGBA1C 6.8 (H) 02/24/2020   HGBA1C 5.9 (A) 11/06/2019   HGBA1C 6.9 (H) 04/26/2019  02/27/2019: HbA1c calculated from fructosamine is better: 6.45%. 10/24/2018: HbA1c calculated from fructosamine is slightly higher than the one from 02/2018, at 6.6%. 02/13/2018: The HbA1c calculated from the fructosamine is excellent, at 6.3%! 10/16/2017: HbA1c calculated from fructosamine is: 7.3% (higher). 06/16/2017: HbA1c calculated from the fructosamine is stable, at 6.8%.  12/08/2016: HbA1c calculated from the fructosamine is better, at 6.86%. 08/10/2016: HbA1c calculated from  fructosamine is 7.88% 05/10/2016: HbA1c calculated from fructosamine is 7.6% 01/06/2016: HbA1c calculated from fructosamine is 6.6% 09/06/2016: HbA1c calculated from fructosamine is 7.0% 06/01/2015: HbA1c calculated from fructosamine is 7.0% 02/23/2015: HbA1c calculated from fructosamine is 6.88% 10/15/2014: HbA1c calculated from fructosamine is 7.17% He started to change his eating habits since 03/2013.   He is on: - Glipizide 10 mg 2x a day - Metformin 1000 mg 2x a day >> 500 mg 2x a day (04/2019) - Januvia 100 mg daily  - Jardiance 10 mg in a.m. every other day due to increased urination >>  now every day 02/2019 He does not miss doses.  Pt checks his sugars 4 times a day and brings an excellent log: - am: 110-138 >> 110-142 >> 116-136 >> 92-118 >> 101-130 - 2h after breakfast: 136-165 >> 143-162 >> 120-146, 151 >> 130-164 - before lunch 144 >> 110-120s >> n/c  - before dinner: 104-129 >> 102-127 >> 97-117 >> 97-122 - 2h after dinner: 136-155 >> 133-164 >> 133-156 >> 116-149 >> 133-147 He has hypoglycemia awareness in the 60. Highest: 287 >> 170 >> 155 >> 165 >> 151 >> 164  His wife is a Radiographer, therapeutic >> he does eat sweets. He saw [name] (nutritionist).  -+ Mild CKD, last BUN/creatinine:  Lab Results  Component Value Date   BUN 21 02/24/2020   CREATININE 1.37 02/24/2020  On Entresto.  -+ HL:  last set of lipids: Lab Results  Component Value Date   CHOL 84 02/24/2020   HDL 51.60 02/24/2020   LDLCALC 12 02/24/2020   LDLDIRECT 18.0 06/13/2017   TRIG 105.0 02/24/2020   CHOLHDL 2 02/24/2020  He was in the Reveal Study x 4 years >> stopped. On Lipitor and omega-3 fatty acids  - last eye exam was 11/2019: No DR ([name]). He had cataract sx's recently.  -No numbness and tingling in his feet.  She has OSA-compliant with CPAP, A. Fib-on Plavix and pradaxa, IBD, GERD, vitamin B12 deficiency - previously on im B12, also, obesity.  In 2019 she was also diagnosed with CHF -EF of 25 to 30%.  His Lasix was increased to twice a day(takes the second dose as needed). Cardiac cath >> no significant obstruction.  He was started on Entresto.  He  has a history of very low B12 vitamin: Lab Results  Component Value Date   VITAMINB12 103 (L) 02/24/2020   VITAMINB12 267 05/01/2012   VITAMINB12 201 (L) 11/23/2010   He was advised to start 5000 units of B12 daily.  He did not have time to get this yet, but he will start soon.  Hypothyroidism:  Pt is on levothyroxine 50 mcg daily, taken: - in am - fasting - at least 30 min from b'fast - no Ca, Fe, MVI, PPIs - not on  Biotin  Latest TSH was normal: Lab Results  Component Value Date   TSH 1.45 02/24/2020   Pt denies: - feeling nodules in neck - hoarseness - dysphagia - choking - SOB with lying down  ROS: Constitutional: no weight gain/no weight loss, no fatigue, no subjective hyperthermia, no subjective hypothermia Eyes: no blurry vision, no xerophthalmia ENT: no sore throat, + see HPI Cardiovascular: no CP/no SOB/no palpitations/no leg swelling Respiratory: no cough/no SOB/no wheezing Gastrointestinal: no N/no V/no D/no C/no acid reflux Musculoskeletal: no muscle aches/no joint aches Skin: no rashes, no hair loss Neurological: no tremors/no numbness/no tingling/no dizziness  I reviewed pt's medications, allergies, PMH, social hx, family hx, and changes were documented in the history of present illness. Otherwise, unchanged from my initial visit note.  Past Medical History:  Diagnosis Date  . Atrial fibrillation ([place])    initial diagnoses 2012  . B12 deficiency    per patient previously taking shots  . C. difficile diarrhea   . CORONARY ARTERY DISEASE 04/12/2007   2 stents last in 2006.   Marland Kitchen DIABETES MELLITUS, TYPE II 04/16/2007  . Diverticulosis of colon (without mention of hemorrhage)   . HYPERLIPIDEMIA 04/16/2007   reveal study. not sure of medication  . HYPERTENSION 04/12/2007  . HYPOTHYROIDISM 04/12/2007  . Ischemic cardiomyopathy    cath 35-40%  . MYOCARDIAL INFARCTION, HX OF 04/12/2007  . OBESITY 06/16/2009  . SLEEP APNEA 04/12/2007   CPAP  . Stroke ([place])   . ULCERATIVE COLITIS, LEFT SIDED 11/23/2010   Past Surgical History:  Procedure Laterality Date  . CARDIAC CATHETERIZATION  10/2002   STENT. 2 stents Dr. Maurene Capes  . CARDIAC CATHETERIZATION N/A 10/28/2015   Procedure: Right/Left Heart Cath and Coronary Angiography;  Surgeon: Sherren Mocha, MD;  Location: [facility];  Service: Cardiovascular;  Laterality: N/A;  . CATARACT EXTRACTION     2021 bilateral eyes   . CORONARY STENT  PLACEMENT  2008   LAD   . RIGHT/LEFT HEART CATH AND CORONARY ANGIOGRAPHY N/A 06/01/2018   Procedure: RIGHT/LEFT HEART CATH AND CORONARY ANGIOGRAPHY;  Surgeon: Sherren Mocha, MD;  Location: [facility];  Service: Cardiovascular;  Laterality: N/A;  . TONSILLECTOMY     Social History   Socioeconomic History  . Marital status: Married    Spouse name: Not on file  . Number of children: 1  . Years of education: Not on file  . Highest education level: Not on file  Occupational History  . Occupation: RETIRED    Employer: RETIRED  Tobacco Use  . Smoking status: Former Smoker    Packs/day: 1.00    Years: 5.00    Pack years: 5.00    Types: Cigarettes    Quit date: 04/13/1966    Years since quitting: 53.9  . Smokeless tobacco: Never Used  Substance and Sexual Activity  . Alcohol use: No    Alcohol/week: 0.0 standard drinks    Comment: no more beer  . Drug  use: No  . Sexual activity: Not on file  Other Topics Concern  . Not on file  Social History Narrative   Cardiorehab 3 days a week. 45 minutes to an hour-stationary bike.    GRANDDAUGHTER (MS. 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       Married for 37 years in 2015, married previously for 14 years. Daughter with first wife and 3 grandkids.    Lives alone with wife. Get to see grandkids a lot. New grandchild in middle of 49      Hobbies-yardwork, previously liked to hunt and fish, does some target shooting   Social Determinants of Health   Financial Resource Strain:   . Difficulty of Paying Living Expenses:   Food Insecurity:   . Worried About Charity fundraiser in the Last Year:   . Arboriculturist in the Last Year:   Transportation Needs:   . Film/video editor (Medical):   Marland Kitchen Lack of Transportation (Non-Medical):   Physical Activity:   . Days of Exercise per Week:   . Minutes of Exercise per Session:    Stress:   . Feeling of Stress :   Social Connections:   . Frequency of Communication with Friends and Family:   . Frequency of Social Gatherings with Friends and Family:   . Attends Religious Services:   . Active Member of Clubs or Organizations:   . Attends Archivist Meetings:   Marland Kitchen Marital Status:   Intimate Partner Violence:   . Fear of Current or Ex-Partner:   . Emotionally Abused:   Marland Kitchen Physically Abused:   . Sexually Abused:    Current Outpatient Medications on File Prior to Visit  Medication Sig Dispense Refill  . acetaminophen (TYLENOL) 500 MG tablet Take 1,000 mg by mouth every 6 (six) hours as needed for headache (pain).    . APRISO 0.375 g 24 hr capsule TAKE 4 CAPSULES BY MOUTH DAILY 120 capsule 5  . atorvastatin (LIPITOR) 20 MG tablet TAKE ONE TABLET (20MG TOTAL) BY MOUTH DAILY 90 tablet 3  . carvedilol (COREG) 25 MG tablet TAKE ONE TABLET BY MOUTH TWICE A DAY 180 tablet 3  . clopidogrel (PLAVIX) 75 MG tablet Take 1 tablet (75 mg total) by mouth daily. (Patient taking differently: Take 75 mg by mouth daily with breakfast. ) 90 tablet 3  . Coenzyme Q10 (COQ10 PO) Take 1 capsule by mouth daily with supper.    . dabigatran (PRADAXA) 150 MG CAPS capsule Take 1 capsule (150 mg total) by mouth 2 (two) times daily. 60 capsule 5  . diclofenac Sodium (VOLTAREN) 1 % GEL Apply topically.    . dicyclomine (BENTYL) 10 MG capsule Take 1 capsule (10 mg total) by mouth 4 (four) times daily as needed (Diarrhea or abdominal cramping). 90 capsule 0  . DIFICID 200 MG TABS tablet     . empagliflozin (JARDIANCE) 10 MG TABS tablet TAKE ONE (1) TABLET BY MOUTH EVERY DAY 90 tablet 3  . ENTRESTO 97-103 MG TAKE ONE TABLET BY MOUTH TWICE A DAY 60 tablet 6  . fish oil-omega-3 fatty acids 1000 MG capsule Take 1 g by mouth daily with supper.     . furosemide (LASIX) 40 MG tablet TAKE ONE TABLET BY MOUTH EVERY DAY. 180 tablet 1  . glipiZIDE (GLUCOTROL) 10 MG tablet Take 1 tablet (  10 mg total) by  mouth 2 (two) times daily before a meal. 180 tablet 3  . glucosamine-chondroitin 500-400 MG tablet Take 1 tablet by mouth daily as needed (knee flare up or pain).    Marland Kitchen glucose blood test strip Use as instructed to test 4 time daily 400 each 3  . isosorbide mononitrate (IMDUR) 60 MG 24 hr tablet TAKE ONE (1) TABLET BY MOUTH EVERY DAY 90 tablet 3  . saccharomyces boulardii (FLORASTOR) 250 MG capsule Take 1 capsule (250 mg total) by mouth 2 (two) times daily.    . sitaGLIPtin (JANUVIA) 100 MG tablet TAKE ONE (1) TABLET BY MOUTH EVERY DAY 90 tablet 3  . spironolactone (ALDACTONE) 25 MG tablet TAKE ONE (1) TABLET BY MOUTH EVERY DAY 90 tablet 3  . SYNTHROID 50 MCG tablet TAKE ONE TABLET BY MOUTH DAILY BEFORE BREAKFAST 90 tablet 3   No current facility-administered medications on file prior to visit.   Allergies  Allergen Reactions  . Clindamycin/Lincomycin     Developed C. difficile colitis 1 month after use   Family History  Problem Relation Age of Onset  . Heart attack Mother        mid 70s  . Heart disease Father        H/O CAD, CABG, VALVE SURGERY  . Hypertension Brother   . Obesity Brother   . Stomach cancer Maternal Aunt   . Stomach cancer Paternal Grandmother        ? colon   . Colon cancer Neg Hx     PE: BP 110/60 (BP Location: Left Arm, Patient Position: Sitting, Cuff Size: Large)   Pulse (!) 57   Ht 5' 7"  (1.702 m)   Wt 247 lb 9.6 oz (112.3 kg)   SpO2 98%   BMI 38.78 kg/m  Body mass index is 38.78 kg/m.  Wt Readings from Last 3 Encounters:  03/02/20 247 lb 9.6 oz (112.3 kg)  02/24/20 244 lb 9.6 oz (110.9 kg)  11/06/19 238 lb (108 kg)   Constitutional: overweight, in NAD Eyes: PERRLA, EOMI, no exophthalmos ENT: moist mucous membranes, no thyromegaly, no cervical lymphadenopathy Cardiovascular: RRR, No MRG, + bilateral LE edema left > right Respiratory: CTA B Gastrointestinal: abdomen soft, NT, ND, BS+ Musculoskeletal: no deformities, strength intact in all  4 Skin: moist, warm, no rashes Neurological: no tremor with outstretched hands, DTR normal in all 4  ASSESSMENT: 1. DM2, non-insulin-dependent, uncontrolled, with complications - CAD, s/p MI 10/2006 - s/p stent - sees [name]  2. Obesity class 3 BMI Classification:  < 18.5 underweight   18.5-24.9 normal weight   25.0-29.9 overweight   30.0-34.9 class I obesity   35.0-39.9 class II obesity   ? 40.0 class III obesity   3. HL  4.  Hypothyroidism  PLAN:  1. Patient with longstanding well-controlled diabetes on oral antidiabetic regimen with sulfonylurea, Metformin, diabetes reviewed with her and SGLT 2 inhibitor.  He decreased the dose of Metformin after he lost weight following his C. difficile infection last year.  We did not increase the dose as sugars appear controlled at last visit.  We continued the same regimen at last visit.  At that time, HbA1c was excellent, and 5.9%, but he had another HbA1c checked earlier this month and this was higher, at 6.8%. -At this visit, sugars are still at goal, only slightly higher than before.  No significant hypoglycemia and no hypoglycemia.  No reason to change his regimen for now.  He does plan to  work on improving his diet as he mentions that he also gained weight recently (per review of his weight from last visit, he gained 9 pounds.. - I advised him to Patient Instructions  Please continue: - Glipizide 10 mg 2x a day - Metformin 500 mg 2x a day - Januvia 1000 mg daily  - Jardiance 10 mg before breakfast every day  Please return in 4 months with your sugar log.   - advised to check sugars at different times of the day - 4x a day, rotating check times - advised for yearly eye exams >> he is UTD - return to clinic in 4 months    2. HL -Reviewed latest lipid panel from 02/2020: LDL very low, rest of the fractions at goal: Lab Results  Component Value Date   CHOL 84 02/24/2020   HDL 51.60 02/24/2020   LDLCALC 12 02/24/2020    LDLDIRECT 18.0 06/13/2017   TRIG 105.0 02/24/2020   CHOLHDL 2 02/24/2020  -Continues Lipitor and omega-3 fatty acids without side effects  3.  Hypothyroidism - latest thyroid labs reviewed with pt >> normal: Lab Results  Component Value Date   TSH 1.45 02/24/2020   - he continues on LT4 50 mcg daily - pt feels good on this dose. - we discussed about taking the thyroid hormone every day, with water, >30 minutes before breakfast, separated by >4 hours from acid reflux medications, calcium, iron, multivitamins. Pt. is taking it correctly.   Philemon Kingdom, MD PhD [facility] Endocrinology

## 2020-03-02 NOTE — Patient Instructions (Signed)
Please continue: - Glipizide 10 mg 2x a day - Metformin 500 mg 2x a day - Januvia 1000 mg daily  - Jardiance 10 mg before breakfast every day  Please return in 4 months with your sugar log.

## 2020-04-08 ENCOUNTER — Other Ambulatory Visit: Payer: Self-pay

## 2020-04-08 ENCOUNTER — Ambulatory Visit (INDEPENDENT_AMBULATORY_CARE_PROVIDER_SITE_OTHER): Payer: Medicare Other | Admitting: Medical

## 2020-04-08 ENCOUNTER — Encounter: Payer: Self-pay | Admitting: Physician Assistant

## 2020-04-08 ENCOUNTER — Other Ambulatory Visit: Payer: Self-pay | Admitting: Family Medicine

## 2020-04-08 VITALS — BP 142/80 | HR 76 | Ht 67.0 in | Wt 247.2 lb

## 2020-04-08 DIAGNOSIS — I10 Essential (primary) hypertension: Secondary | ICD-10-CM | POA: Diagnosis not present

## 2020-04-08 DIAGNOSIS — I5022 Chronic systolic (congestive) heart failure: Secondary | ICD-10-CM

## 2020-04-08 DIAGNOSIS — I2510 Atherosclerotic heart disease of native coronary artery without angina pectoris: Secondary | ICD-10-CM | POA: Diagnosis not present

## 2020-04-08 DIAGNOSIS — I482 Chronic atrial fibrillation, unspecified: Secondary | ICD-10-CM

## 2020-04-08 NOTE — Progress Notes (Signed)
Cardiology Office Note  Date:  04/08/2020   ID:  [name], [name] [birth year], MRN [id number]  PCP:  Andrew Olp, MD  Cardiologist:  [name]  CHF: Dr. Haroldine Laws Electrophysiologist: Thompson Grayer, MD _____________  Routine follow-up  _____________   History of Present Illness: [name] is a 74 y.o. male who presents today for routine follow-up, followed by [name] and seen by Dr. Haroldine Laws and Dr. Rayann Heman in the past.   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 IVIS showed under deployed stent and the stent was dilated to 3.25 mm with good result. Cardiac cath in 2017 showed patent stent in the LAD, severe ostial stenosis of the L cx not found to be amenable to PCI, patent nondominant RCA supplying collaterals to the diagonal branch to the LAD. Medical therapy was recommended at that time including Plavix.   Echo in 09/2016 showed LVEF 40-45%. He saw [name] 04/2017 for worsening dyspnea and echo showed LVEF 25-30%. Lasix was increased. R/LHC was performed 05/2018 showing widely patent left main, LAD and left cx, severe ostial cx and ostial OM lesions unchanged from prior cath in 2017, well compensated right and left heart hemodynamics with low output heart failure. Due to these findings he was referred to Dr. Haroldine Laws.   He saw Dr. Haroldine Laws 09/2018 and was overall feeling well. He was in the cardiac rehab rehab program and reported improved exercise tolerance. He was taking entresto, Coreg, spironolactone, lasix. Given low EF he was referred to EP for ICD placement.   He was seen by Dr. Rayann Heman 11/20/19, referred by Dr. Haroldine Laws for ICD implantation which after discussion the patient decided to defer.  Today, the patient has no complaints. He has  been taking his medications without issues. On Pradaxa and plavix, denies bleeding issues. He has been working outside in the yard often. Denies chest pain but sometimes gets sob which is not new for him. Has chronic minimal edema on the left side, right side with no edema. At home he never uses salt on his food however will eat out 3 times a week. Admits to stable weights. He has been fully vaccinated for covid. Has had problems with C.diff earlier this year that has since improved. He denies symptoms of palpitations, orthopnea, PND, lower extremity edema, claudication, dizziness, presyncope, syncope, bleeding, or neurologic sequela. The patient is tolerating medications without difficulties and is otherwise without complaint today.   Echo 09/2018 - Left ventricle: The cavity size was normal. There was mild  concentric hypertrophy. Systolic function was severely reduced.  The estimated ejection fraction was in the range of 25% to 30%.  Wall motion was normal; there were no regional wall motion  abnormalities. Doppler parameters are consistent with restrictive  physiology, indicative of decreased left ventricular diastolic  compliance and/or increased left atrial pressure. Doppler  parameters are consistent with elevated ventricular end-diastolic  filling pressure.  - Ventricular septum: Septal motion showed paradox.  - Aortic valve: Valve area (VTI): 2.87 cm^2. Valve area (Vmax):  2.61 cm^2. Valve area (Vmean): 2.71 cm^2.  - Mitral valve: There was mild regurgitation.  - Left atrium: The atrium was severely dilated.  - Right ventricle: The cavity size was normal. Wall thickness was  normal. Systolic function was normal.  -  Right atrium: The atrium was normal in size.  - Tricuspid valve: There was no regurgitation.  - Pulmonic valve: There was no regurgitation.  - Pulmonary arteries: The main pulmonary artery was normal-sized.  Systolic pressure could not be accurately  estimated.  - Inferior vena cava: The vessel was normal in size.  - Pericardium, extracardiac: There was no pericardial effusion.   Cardiac cath 2019   1st Diag lesion is 100% stenosed.  Ost Ramus lesion is 50% stenosed.  Ost Cx lesion is 80% stenosed.  Ost 2nd Mrg to 2nd Mrg lesion is 95% stenosed.  Mid RCA lesion is 25% stenosed.  Mid LM to Dist LM lesion is 25% stenosed.  Ost LAD to Prox LAD lesion is 20% stenosed.   1.  Widely patent left main, LAD, and left circumflex 2.  Severe ostial circumflex and ostial OM lesions unchanged from the previous cath study in 2017 3.  Slow flow in the first diagonal branch of the LAD, collateralized from the right PDA 4.  Well compensated left and right heart hemodynamics with low cardiac output calculated by Fick, possibly related to the Fick calculation with the patient's morbid obesity Recommendations: Stable coronary anatomy, ongoing medical therapy for heart failure.  Left circumflex/obtuse marginal stenoses not approachable by PCI based on ostial lesion locations.   _____________   Past Medical History:  Diagnosis Date  . ARF (acute renal failure) ([place]) 04/15/2019  . Atherosclerotic heart disease of native coronary artery with other forms of angina pectoris ([place]) 04/26/2019  . Atrial fibrillation ([place])    initial diagnoses 2012  . B12 deficiency    per patient previously taking shots  . C. difficile colitis 04/17/2019  . C. difficile diarrhea   . CAD (coronary artery disease), native coronary artery 04/12/2007   History of MI. 2 stents. Plavix. Imdur 67m 10/28/15 cath Dr. CBurt Knack Findings: severe ostial LCx stenosis, patent LAD stent, patent dominant RCA supplies diagonal collateral.    . Chronic combined systolic and diastolic CHF (congestive heart failure) ([place] 04/15/2019  . CORONARY ARTERY DISEASE 04/12/2007   2 stents last in 2006.   .Marland KitchenCystoid macular edema of right eye 01/16/2020  . DIABETES MELLITUS, TYPE II 04/16/2007  .  Diverticulosis of colon (without mention of hemorrhage)   . GERD (gastroesophageal reflux disease) 08/09/2011  . HYPERLIPIDEMIA 04/16/2007   reveal study. not sure of medication  . Hyperlipidemia 04/16/2007   10/24 reveal study end. Atorvastatin 253m . HYPERTENSION 04/12/2007  . HYPOTHYROIDISM 04/12/2007  . Hypothyroidism 04/12/2007   Synthroid 509m  . Ischemic cardiomyopathy    cath 35-40%  . Morbid obesity ([place]CT[place]0/30/2012  . MYOCARDIAL INFARCTION, HX OF 04/12/2007  . OBESITY 06/16/2009  . Obstructive sleep apnea 04/12/2007   02/2011 - AHI 96/h CPAP 13, Lg FF     . SLEEP APNEA 04/12/2007   CPAP  . Stroke ([place] . Type II diabetes mellitus with peripheral circulatory disorder ([place]CC[place]/04/2007   Dr. GheCruzita Lederernages. Uses fructosamine.    . UMarland KitchenCERATIVE COLITIS, LEFT SIDED 11/23/2010  . Ulcerative pancolitis without complication ([place]CCByrd[place]0/23/76/2831Ulcerative Colitis. Mesalamine.   . VMarland KitchenTAMIN B12 DEFICIENCY 11/29/2010   Past Surgical History:  Procedure Laterality Date  . CARDIAC CATHETERIZATION  10/2002   STENT. 2 stents Dr. BroMaurene Capes CARDIAC CATHETERIZATION N/A 10/28/2015   Procedure: Right/Left Heart Cath and Coronary Angiography;  Surgeon: MicSherren MochaD;  Location: [facility];  Service: Cardiovascular;  Laterality: N/A;  .  CATARACT EXTRACTION     2021 bilateral eyes   . CORONARY STENT PLACEMENT  2008   LAD   . RIGHT/LEFT HEART CATH AND CORONARY ANGIOGRAPHY N/A 06/01/2018   Procedure: RIGHT/LEFT HEART CATH AND CORONARY ANGIOGRAPHY;  Surgeon: Sherren Mocha, MD;  Location: [facility];  Service: Cardiovascular;  Laterality: N/A;  . TONSILLECTOMY     _____________  Current Outpatient Medications  Medication Sig Dispense Refill  . acetaminophen (TYLENOL) 500 MG tablet Take 1,000 mg by mouth every 6 (six) hours as needed for headache (pain).    . APRISO 0.375 g 24 hr capsule TAKE 4 CAPSULES BY MOUTH DAILY 120 capsule 5  . atorvastatin (LIPITOR) 20 MG tablet TAKE ONE TABLET  (20MG TOTAL) BY MOUTH DAILY 90 tablet 3  . carvedilol (COREG) 25 MG tablet TAKE ONE TABLET BY MOUTH TWICE A DAY 180 tablet 3  . clopidogrel (PLAVIX) 75 MG tablet Take 1 tablet (75 mg total) by mouth daily. (Patient taking differently: Take 75 mg by mouth daily with breakfast. ) 90 tablet 3  . Coenzyme Q10 (COQ10 PO) Take 1 capsule by mouth daily with supper.    . dabigatran (PRADAXA) 150 MG CAPS capsule Take 1 capsule (150 mg total) by mouth 2 (two) times daily. 60 capsule 5  . diclofenac Sodium (VOLTAREN) 1 % GEL Apply topically.    . dicyclomine (BENTYL) 10 MG capsule Take 1 capsule (10 mg total) by mouth 4 (four) times daily as needed (Diarrhea or abdominal cramping). 90 capsule 0  . DIFICID 200 MG TABS tablet     . empagliflozin (JARDIANCE) 10 MG TABS tablet TAKE ONE (1) TABLET BY MOUTH EVERY DAY 90 tablet 3  . ENTRESTO 97-103 MG TAKE ONE TABLET BY MOUTH TWICE A DAY 60 tablet 6  . fish oil-omega-3 fatty acids 1000 MG capsule Take 1 g by mouth daily with supper.     . furosemide (LASIX) 40 MG tablet TAKE ONE TABLET BY MOUTH EVERY DAY. 180 tablet 1  . glipiZIDE (GLUCOTROL) 10 MG tablet Take 1 tablet (10 mg total) by mouth 2 (two) times daily before a meal. 180 tablet 3  . glucosamine-chondroitin 500-400 MG tablet Take 1 tablet by mouth daily as needed (knee flare up or pain).    Marland Kitchen glucose blood test strip Use as instructed to test 4 time daily 400 each 3  . isosorbide mononitrate (IMDUR) 60 MG 24 hr tablet TAKE ONE (1) TABLET BY MOUTH EVERY DAY 90 tablet 3  . saccharomyces boulardii (FLORASTOR) 250 MG capsule Take 1 capsule (250 mg total) by mouth 2 (two) times daily.    . sitaGLIPtin (JANUVIA) 100 MG tablet TAKE ONE (1) TABLET BY MOUTH EVERY DAY 90 tablet 3  . spironolactone (ALDACTONE) 25 MG tablet TAKE ONE (1) TABLET BY MOUTH EVERY DAY 90 tablet 3  . SYNTHROID 50 MCG tablet TAKE ONE TABLET BY MOUTH DAILY BEFORE BREAKFAST 90 tablet 3   No current facility-administered medications for this  visit.   _____________   Allergies:   Clindamycin/lincomycin  _____________   Social History:  The patient  reports that he quit smoking about 54 years ago. His smoking use included cigarettes. He has a 5.00 pack-year smoking history. He has never used smokeless tobacco. He reports that he does not drink alcohol and does not use drugs.  _____________   Family History:  The patient's family history includes Heart attack in his mother; Heart disease in his father; Hypertension in his brother; Obesity in his  brother; Stomach cancer in his maternal aunt and paternal grandmother.  _____________   ROS:  Please see the history of present illness.   Positive for chronic minimal LLE,   All other systems are reviewed and negative.  _____________   PHYSICAL EXAM: VS:  There were no vitals taken for this visit. , BMI There is no height or weight on file to calculate BMI. GEN: Well nourished, well developed, in no acute distress  HEENT: normal  Neck: no JVD, carotid bruits, or masses Cardiac: RRR; no murmurs, rubs, or gallops. No clubbing, cyanosis, minimal left pedal edema.  Radials/DP/PT 2+ and equal bilaterally.  Respiratory:  clear to auscultation bilaterally, normal work of breathing GI: soft, nontender, nondistended, + BS MS: no deformity or atrophy  Skin: warm and dry, no rash Neuro:  Strength and sensation are intact Psych: euthymic mood, full affect _____________  EKG:   The ekg ordered today shows Afib, 76 bpm, no changes  Recent Labs: 04/26/2019: Magnesium 1.8 02/24/2020: ALT 8; BUN 21; Creatinine, Ser 1.37; Hemoglobin 14.0; Platelets 182.0; Potassium 4.6; Sodium 141; TSH 1.45  02/24/2020: Cholesterol 84; HDL 51.60; LDL Cholesterol 12; Total CHOL/HDL Ratio 2; Triglycerides 105.0; VLDL 21.0  CrCl cannot be calculated (Patient's most recent lab result is older than the maximum 21 days allowed.).  Wt Readings from Last 3 Encounters:  03/02/20 247 lb 9.6 oz (112.3 kg)  02/24/20 244 lb  9.6 oz (110.9 kg)  11/06/19 238 lb (108 kg)    _____________   ASSESSMENT AND PLAN:  Chronic combined systolic and diastolic/Ischemic Cardiomyopathy - LVEF in 09/2018 showed persistently reduced EF of 25-30% - He was referred to EP for ICD and seen 11/2018 by Dr. Rayann Heman. After discussion the patient decided to defer ICD implantion - Entresto 97/103 mg BID - Coreg 25 mg BID - spironolactone 45m daily - Imdur 60 mg daily - Lasix 40 mg daily - Patient has stable minimal left lower leg edema on exam. Denies significant sob. No orthopnea, PND.  - Creatinine relatively stable at 1.3. Would re-check at next visit - ICD discussed which again he is not interested in - Reiterated importance of low salt diet and recommended eating out less. Continue with daily weights.  CAD - LHC in 05/2018 showed widely patent left main, LAD and Cx, severe ostial Cx and ostial OM lesions unchange from cath in 2017, slow flow in the 1st diag of the LAD with collaterals - Plavix. No ASA with Pradaxa - Denies chest pain  Chronic afib - rate controlled on Coreg - Pradaxa for stroke ppx. No bleeding problems  HTN - Coreg, Entresto, Imdur, spironolactone, lasix - BP today a little high at 142/80. Patient reports BP normally runs 120s/70s - No changes today  HLD - Atorvastatin 20 mg daily - Recent labs HDL 51, total chol 84, LDL 12, TG 105  OSA - reports compliance   Disposition:   FU with Dr. CBurt Knackin 6 months   Signed, CM[place]clair NP 04/08/2020 11:33 AM    _____________ [id number] ([phone](office) ([phone](fax)

## 2020-04-08 NOTE — Patient Instructions (Addendum)
Medication Instructions:  Your physician recommends that you continue on your current medications as directed. Please refer to the Current Medication list given to you today.  *If you need a refill on your cardiac medications before your next appointment, please call your pharmacy*   Lab Work: None ordered  If you have labs (blood work) drawn today and your tests are completely normal, you will receive your results only by: Marland Kitchen MyChart Message (if you have MyChart) OR . A paper copy in the mail If you have any lab test that is abnormal or we need to change your treatment, we will call you to review the results.   Testing/Procedures: None ordered   Follow-Up: At [facility], you and your health needs are our priority.  As part of our continuing mission to provide you with exceptional heart care, we have created designated Provider Care Teams.  These Care Teams include your primary Cardiologist (physician) and Advanced Practice Providers (APPs -  Physician Assistants and Nurse Practitioners) who all work together to provide you with the care you need, when you need it.  We recommend signing up for the patient portal called "MyChart".  Sign up information is provided on this After Visit Summary.  MyChart is used to connect with patients for Virtual Visits (Telemedicine).  Patients are able to view lab/test results, encounter notes, upcoming appointments, etc.  Non-urgent messages can be sent to your provider as well.   To learn more about what you can do with MyChart, go to [email or web address].    Your next appointment:   6 month(s)  The format for your next appointment:   In Person  Provider:   You may see Sherren Mocha, MD or one of the following Advanced Practice Providers on your designated Care Team:    Richardson Dopp, PA-C  Robbie Lis, [place]    Other Instructions

## 2020-04-15 ENCOUNTER — Encounter (INDEPENDENT_AMBULATORY_CARE_PROVIDER_SITE_OTHER): Payer: Medicare Other | Admitting: Ophthalmology

## 2020-04-29 ENCOUNTER — Encounter (INDEPENDENT_AMBULATORY_CARE_PROVIDER_SITE_OTHER): Payer: Medicare Other | Admitting: Ophthalmology

## 2020-05-01 ENCOUNTER — Other Ambulatory Visit (HOSPITAL_COMMUNITY): Payer: Self-pay | Admitting: Internal Medicine

## 2020-05-05 ENCOUNTER — Telehealth: Payer: Self-pay | Admitting: Cardiovascular Disease

## 2020-05-05 NOTE — Telephone Encounter (Signed)
1. What dental office are you calling from? Dr. Ger[facility]ist Office   2. What is your office phone number? [phone]   3. What is your fax number? [phone]  4. What type of procedure is the patient having performed? 1 tooth extraction (#32)  5. What date is procedure scheduled or is the patient there now? TBD (if the patient is at the dentist's office question goes to their cardiologist if he/she is in the office.  If not, question should go to the DOD).   6. What is your question (ex. Antibiotics prior to procedure, holding medication-we need to know how long dentist wants pt to hold med)?    Their office would like to inquire about whether or not any medications needs to be held prior to tooth extraction.

## 2020-05-05 NOTE — Telephone Encounter (Signed)
   Primary Cardiologist: Sherren Mocha, MD  Chart reviewed as part of pre-operative protocol coverage.    Simple dental extractions are considered low risk procedures per guidelines and generally do not require any specific cardiac clearance. It is also generally accepted that for simple extractions and dental cleanings, there is no need to interrupt blood thinner therapy.  SBE prophylaxis is not required for the patient.  I will route this recommendation to the requesting party via Epic fax function and remove from pre-op pool.  Please call with questions.  Kerin Ransom, PA-C 05/05/2020, 10:52 AM

## 2020-05-12 ENCOUNTER — Ambulatory Visit: Payer: Medicare Other | Admitting: Nurse Practitioner

## 2020-05-25 ENCOUNTER — Ambulatory Visit: Payer: Medicare Other | Admitting: Podiatry

## 2020-05-27 ENCOUNTER — Encounter (INDEPENDENT_AMBULATORY_CARE_PROVIDER_SITE_OTHER): Payer: Medicare Other | Admitting: Ophthalmology

## 2020-06-17 ENCOUNTER — Other Ambulatory Visit: Payer: Self-pay | Admitting: Cardiovascular Disease

## 2020-06-18 ENCOUNTER — Encounter (INDEPENDENT_AMBULATORY_CARE_PROVIDER_SITE_OTHER): Payer: Medicare Other | Admitting: Ophthalmology

## 2020-06-30 ENCOUNTER — Encounter (INDEPENDENT_AMBULATORY_CARE_PROVIDER_SITE_OTHER): Payer: Self-pay | Admitting: Ophthalmology

## 2020-06-30 ENCOUNTER — Other Ambulatory Visit: Payer: Self-pay

## 2020-06-30 ENCOUNTER — Ambulatory Visit (INDEPENDENT_AMBULATORY_CARE_PROVIDER_SITE_OTHER): Payer: Medicare Other | Admitting: Ophthalmology

## 2020-06-30 DIAGNOSIS — E113393 Type 2 diabetes mellitus with moderate nonproliferative diabetic retinopathy without macular edema, bilateral: Secondary | ICD-10-CM

## 2020-06-30 DIAGNOSIS — I2510 Atherosclerotic heart disease of native coronary artery without angina pectoris: Secondary | ICD-10-CM

## 2020-06-30 DIAGNOSIS — H35351 Cystoid macular degeneration, right eye: Secondary | ICD-10-CM | POA: Diagnosis not present

## 2020-06-30 NOTE — Patient Instructions (Signed)
Patient asked to contact the office promptly if new visual acuity symptoms or decline develops.

## 2020-06-30 NOTE — Progress Notes (Signed)
06/30/2020     CHIEF COMPLAINT Patient presents for Retina Follow Up   HISTORY OF PRESENT ILLNESS: [name] is a 74 y.o. male who presents to the clinic today for:   HPI    Retina Follow Up    Patient presents with  Other.  In both eyes.  This started 5 months ago.  Severity is mild.  Duration of 5 months.  Since onset it is gradually improving.          Comments    5 Month F/U OU  Pt sts OD seems a little clearer. VA stable OS. No symptoms reported OU.       Last edited by Rockie Neighbours, [place] on 06/30/2020  1:41 PM. (History)      Referring physician: Marin Olp, [address],  [place] [id number]  HISTORICAL INFORMATION:   Selected notes from the MEDICAL RECORD NUMBER    Lab Results  Component Value Date   HGBA1C 6.8 (H) 02/24/2020     CURRENT MEDICATIONS: No current outpatient medications on file. (Ophthalmic Drugs)   No current facility-administered medications for this visit. (Ophthalmic Drugs)   Current Outpatient Medications (Other)  Medication Sig  . acetaminophen (TYLENOL) 500 MG tablet Take 1,000 mg by mouth every 6 (six) hours as needed for headache (pain).  . APRISO 0.375 g 24 hr capsule TAKE 4 CAPSULES BY MOUTH DAILY  . atorvastatin (LIPITOR) 20 MG tablet TAKE ONE TABLET (20MG TOTAL) BY MOUTH DAILY  . carvedilol (COREG) 25 MG tablet TAKE ONE TABLET BY MOUTH TWICE A DAY  . clopidogrel (PLAVIX) 75 MG tablet TAKE ONE TABLET (75MG TOTAL) BY MOUTH DAILY  . Coenzyme Q10 (COQ10 PO) Take 1 capsule by mouth daily with supper.  . dabigatran (PRADAXA) 150 MG CAPS capsule Take 1 capsule (150 mg total) by mouth 2 (two) times daily.  . diclofenac Sodium (VOLTAREN) 1 % GEL Apply 1 application topically as needed (knee pain).   Marland Kitchen dicyclomine (BENTYL) 10 MG capsule Take 1 capsule (10 mg total) by mouth 4 (four) times daily as needed (Diarrhea or abdominal cramping).  Marland Kitchen DIFICID 200 MG TABS tablet Take 200 mg by mouth 2 (two) times daily as  needed (stomach).   . empagliflozin (JARDIANCE) 10 MG TABS tablet TAKE ONE (1) TABLET BY MOUTH EVERY DAY  . ENTRESTO 97-103 MG TAKE ONE TABLET BY MOUTH TWICE A DAY  . fish oil-omega-3 fatty acids 1000 MG capsule Take 1 g by mouth daily with supper.   . furosemide (LASIX) 40 MG tablet TAKE ONE TABLET BY MOUTH EVERY DAY.  Marland Kitchen glipiZIDE (GLUCOTROL) 10 MG tablet Take 1 tablet (10 mg total) by mouth 2 (two) times daily before a meal.  . glucosamine-chondroitin 500-400 MG tablet Take 1 tablet by mouth daily as needed (knee flare up or pain).  Marland Kitchen glucose blood test strip Use as instructed to test 4 time daily  . isosorbide mononitrate (IMDUR) 60 MG 24 hr tablet TAKE ONE (1) TABLET BY MOUTH EVERY DAY  . saccharomyces boulardii (FLORASTOR) 250 MG capsule Take 1 capsule (250 mg total) by mouth 2 (two) times daily.  . sitaGLIPtin (JANUVIA) 100 MG tablet TAKE ONE (1) TABLET BY MOUTH EVERY DAY  . spironolactone (ALDACTONE) 25 MG tablet TAKE ONE (1) TABLET BY MOUTH EVERY DAY  . SYNTHROID 50 MCG tablet TAKE ONE TABLET BY MOUTH DAILY BEFORE BREAKFAST   No current facility-administered medications for this visit. (Other)      REVIEW  OF SYSTEMS:    ALLERGIES Allergies  Allergen Reactions  . Clindamycin/Lincomycin     Developed C. difficile colitis 1 month after use    PAST MEDICAL HISTORY Past Medical History:  Diagnosis Date  . ARF (acute renal failure) ([place]) 04/15/2019  . Atherosclerotic heart disease of native coronary artery with other forms of angina pectoris ([place]) 04/26/2019  . Atrial fibrillation ([place])    initial diagnoses 2012  . B12 deficiency    per patient previously taking shots  . C. difficile colitis 04/17/2019  . C. difficile diarrhea   . CAD (coronary artery disease), native coronary artery 04/12/2007   History of MI. 2 stents. Plavix. Imdur 57m 10/28/15 cath Dr. CBurt Knack Findings: severe ostial LCx stenosis, patent LAD stent, patent dominant RCA supplies diagonal collateral.    .  Chronic combined systolic and diastolic CHF (congestive heart failure) ([place] 04/15/2019  . CORONARY ARTERY DISEASE 04/12/2007   2 stents last in 2006.   .Marland KitchenCystoid macular edema of right eye 01/16/2020  . DIABETES MELLITUS, TYPE II 04/16/2007  . Diverticulosis of colon (without mention of hemorrhage)   . GERD (gastroesophageal reflux disease) 08/09/2011  . HYPERLIPIDEMIA 04/16/2007   reveal study. not sure of medication  . Hyperlipidemia 04/16/2007   10/24 reveal study end. Atorvastatin 273m . HYPERTENSION 04/12/2007  . HYPOTHYROIDISM 04/12/2007  . Hypothyroidism 04/12/2007   Synthroid 5054m  . Ischemic cardiomyopathy    cath 35-40%  . Morbid obesity ([place]CC[place]0/30/2012  . MYOCARDIAL INFARCTION, HX OF 04/12/2007  . OBESITY 06/16/2009  . Obstructive sleep apnea 04/12/2007   02/2011 - AHI 96/h CPAP 13, Lg FF     . SLEEP APNEA 04/12/2007   CPAP  . St[place] ([place] . Type II diabetes mellitus with peripheral circulatory disorder ([place]/04/2007   Dr. GheCruzita Lederernages. Uses fructosamine.    . UMarland KitchenCERATIVE COLITIS, LEFT SIDED 11/23/2010  . Ulcerative pancolitis without complication ([place]CC[place]0/56/38/7564Ulcerative Colitis. Mesalamine.   . VMarland KitchenTAMIN B12 DEFICIENCY 11/29/2010   Past Surgical History:  Procedure Laterality Date  . CARDIAC CATHETERIZATION  10/2002   STENT. 2 stents Dr. BroMaurene Capes CARDIAC CATHETERIZATION N/A 10/28/2015   Procedure: Right/Left Heart Cath and Coronary Angiography;  Surgeon: MicSherren MochaD;  Location: [facility];  Service: Cardiovascular;  Laterality: N/A;  . CATARACT EXTRACTION     2021 bilateral eyes   . CORONARY STENT PLACEMENT  2008   LAD   . RIGHT/LEFT HEART CATH AND CORONARY ANGIOGRAPHY N/A 06/01/2018   Procedure: RIGHT/LEFT HEART CATH AND CORONARY ANGIOGRAPHY;  Surgeon: CooSherren MochaD;  Location: [facility];  Service: Cardiovascular;  Laterality: N/A;  . TONSILLECTOMY      FAMILY HISTORY Family History  Problem Relation Age of Onset  . Heart attack Mother         mid 70s31s Heart disease Father        H/O CAD, CABG, VALVE SURGERY  . Hypertension Brother   . Obesity Brother   . Stomach cancer Maternal Aunt   . Stomach cancer Paternal Grandmother        ? colon   . Colon cancer Neg Hx     SOCIAL HISTORY Social History   Tobacco Use  . Smoking status: Former Smoker    Packs/day: 1.00    Years: 5.00    Pack years: 5.00    Types: Cigarettes    Quit date: 04/13/1966    Years since quitting: 54.2  .  Smokeless tobacco: Never Used  Vaping Use  . Vaping Use: Never used  Substance Use Topics  . Alcohol use: No    Alcohol/week: 0.0 standard drinks    Comment: no more beer  . Drug use: No         OPHTHALMIC EXAM:  Base Eye Exam    Visual Acuity (ETDRS)      Right Left   Dist Coffee 20/20 20/20 -2       Tonometry (Tonopen, 1:42 PM)      Right Left   Pressure 16 13       Pupils      Pupils Dark Light Shape React APD   Right PERRL 4 3 Round Brisk None   Left PERRL 4 3 Round Brisk None       Visual Fields (Counting fingers)      Left Right    Full Full       Extraocular Movement      Right Left    Full Full       Neuro/Psych    Oriented x3: Yes   Mood/Affect: Normal       Dilation    Both eyes: 1.0% Mydriacyl, 2.5% Phenylephrine @ 1:45 PM        Slit Lamp and Fundus Exam    External Exam      Right Left   External Normal Normal       Slit Lamp Exam      Right Left   Lids/Lashes Normal Normal   Conjunctiva/Sclera White and quiet White and quiet   Cornea Clear Clear   Anterior Chamber Deep and quiet Deep and quiet   Iris Round and reactive Round and reactive   Lens Posterior chamber intraocular lens Posterior chamber intraocular lens   Vitreous Normal Normal       Fundus Exam      Right Left   Disc Normal Normal   C/D Ratio 0.1 0.1   Macula Normal Normal   Vessels Retinopathy moderate NPDR Retinopathy moderate NPDR   Periphery Normal Normal          IMAGING AND PROCEDURES  Imaging and Procedures  for 06/30/20  OCT, Retina - OU - Both Eyes       Right Eye Quality was good. Scan locations included subfoveal. Central Foveal Thickness: 287. Progression has been stable. Findings include normal foveal contour.   Left Eye Quality was good. Scan locations included subfoveal. Central Foveal Thickness: 285. Progression has been stable. Findings include normal foveal contour.   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 Good blood glucose, blood pressure, and serum lipid control was recommended as well as avoidance of smoking and maintenance of normal weight.  Close follow up with PCP encouraged.  This condition remained stable OU at 79-monthfollow-up, will monitor again in 6 months      ICD-10-CM   1. Cystoid macular edema of right eye  H35.351 OCT, Retina - OU - Both Eyes  2. Moderate nonproliferative diabetic retinopathy of both eyes without macular edema associated with type 2 diabetes mellitus (HMer[place]  [id number]    1.  Continue with excellent blood sugar control.  2.  Minor CME  of the right eye is resolved.  3.  No progression of nonproliferative diabetic retinopathy.  Ophthalmic Meds Ordered this visit:  No orders of the defined types were placed in this encounter.      Return in about 6 months (around 12/28/2020) for DILATE OU, OCT.  Patient Instructions  Patient asked to contact the office promptly if new visual acuity symptoms or decline develops.    Explained the diagnoses, plan, and follow up with the patient and they expressed understanding.  Patient expressed understanding of the importance of proper follow up care.   Clent Demark Iyad Deroo M.D. Diseases & Surgery of the Retina and Vitreous Retina &  Diabetic [place] 06/30/20     Abbreviations: M myopia (nearsighted); A astigmatism; H hyperopia (farsighted); P presbyopia; Mrx spectacle prescription;  CTL contact lenses; OD right eye; OS left eye; OU both eyes  XT exotropia; ET esotropia; PEK punctate epithelial keratitis; PEE punctate epithelial erosions; DES dry eye syndrome; MGD meibomian gland dysfunction; ATs artificial tears; PFAT's preservative free artificial tears; [place] nuclear sclerotic cataract; PSC posterior subcapsular cataract; ERM epi-retinal membrane; PVD posterior vitreous detachment; RD retinal detachment; DM diabetes mellitus; DR diabetic retinopathy; NPDR non-proliferative diabetic retinopathy; PDR proliferative diabetic retinopathy; CSME clinically significant macular edema; DME diabetic macular edema; dbh dot blot hemorrhages; CWS cotton wool spot; POAG primary open angle glaucoma; C/D cup-to-disc ratio; HVF humphrey visual field; GVF goldmann visual field; OCT optical coherence tomography; IOP intraocular pressure; BRVO Branch retinal vein occlusion; CRVO central retinal vein occlusion; CRAO central retinal artery occlusion; BRAO branch retinal artery occlusion; RT retinal tear; SB scleral buckle; PPV pars plana vitrectomy; VH Vitreous hemorrhage; PRP panretinal laser photocoagulation; IVK intravitreal kenalog; VMT vitreomacular traction; MH Macular hole;  NVD neovascularization of the disc; NVE neovascularization elsewhere; AREDS age related eye disease study; ARMD age related macular degeneration; POAG primary open angle glaucoma; EBMD epithelial/anterior basement membrane dystrophy; ACIOL anterior chamber intraocular lens; IOL intraocular lens; PCIOL posterior chamber intraocular lens; Phaco/IOL phacoemulsification with intraocular lens placement; [place] photorefractive keratectomy; LASIK laser assisted in situ keratomileusis; HTN hypertension; DM diabetes mellitus; COPD chronic obstructive pulmonary disease

## 2020-06-30 NOTE — Assessment & Plan Note (Addendum)
The nature of moderate nonproliferative diabetic retinopathy was discussed with the patient as well as the need for more frequent follow up to judge for progression. Good blood glucose, blood pressure, and serum lipid control was recommended as well as avoidance of smoking and maintenance of normal weight.  Close follow up with PCP encouraged.  This condition remained stable OU at 2-monthfollow-up, will monitor again in 6 months

## 2020-07-07 ENCOUNTER — Ambulatory Visit (INDEPENDENT_AMBULATORY_CARE_PROVIDER_SITE_OTHER): Payer: Medicare Other | Admitting: Nurse Practitioner

## 2020-07-07 ENCOUNTER — Other Ambulatory Visit: Payer: Self-pay

## 2020-07-07 ENCOUNTER — Encounter: Payer: Self-pay | Admitting: Nurse Practitioner

## 2020-07-07 VITALS — BP 196/109 | HR 65 | Temp 96.9°F | Ht 67.0 in | Wt 249.8 lb

## 2020-07-07 DIAGNOSIS — R197 Diarrhea, unspecified: Secondary | ICD-10-CM | POA: Diagnosis not present

## 2020-07-07 DIAGNOSIS — A0472 Enterocolitis due to Clostridium difficile, not specified as recurrent: Secondary | ICD-10-CM | POA: Diagnosis not present

## 2020-07-07 DIAGNOSIS — I2510 Atherosclerotic heart disease of native coronary artery without angina pectoris: Secondary | ICD-10-CM | POA: Diagnosis not present

## 2020-07-07 DIAGNOSIS — K5100 Ulcerative (chronic) pancolitis without complications: Secondary | ICD-10-CM

## 2020-07-07 MED ORDER — DICYCLOMINE HCL 10 MG PO CAPS: 10.0000 mg | ORAL_CAPSULE | Freq: Two times a day (BID) | ORAL | 0 refills | Status: DC | PRN

## 2020-07-07 NOTE — Patient Instructions (Signed)
Your health issues we discussed today were:   Ulcerative colitis: 1. Continue taking Apriso as you have been 2. At your follow-up appointment we will discuss scheduling your next/likely last colonoscopy 3. Also give any worsening or significant symptoms  History of C. difficile: 1. Continue taking Florastor as you have been 2. Let us know if your diarrhea returns or becomes severe  Postinfectious irritable bowel syndrome with diarrhea: 1. I sent a refill of Bentyl to your pharmacy.  Continue to take this as you have been 2. Let us know if you have any worsening or severe symptoms  Overall I recommend:  1. Continue your other current medications 2. Return for follow-up in 6 months 3. Call us for any questions or concerns  ---------------------------------------------------------------  I am glad you have gotten your COVID-19 vaccination!  Even though you are fully vaccinated you should continue to follow CDC and state/local guidelines.  ---------------------------------------------------------------   At [facility] we value your feedback. You may receive a survey about your visit today. Please share your experience as we strive to create trusting relationships with our patients to provide genuine, compassionate, quality care.  We appreciate your understanding and patience as we review any laboratory studies, imaging, and other diagnostic tests that are ordered as we care for you. Our office policy is 5 business days for review of these results, and any emergent or urgent results are addressed in a timely manner for your best interest. If you do not hear from our office in 1 week, please contact us.   We also encourage the use of MyChart, which contains your medical information for your review as well. If you are not enrolled in this feature, an access code is on this after visit summary for your convenience. Thank you for allowing [place] to be involved in your care.  It  was great to see you today!  I hope you have a great Fall!!

## 2020-07-07 NOTE — Progress Notes (Signed)
Referring Provider: Marin Olp, MD Primary Care Physician:  Marin Olp, MD Primary GI:  Dr. Gala Romney  Chief Complaint  Patient presents with  . Follow-up    fu c diff, needs RX for Dicyclomine  HCl 10 mg    HPI:   [name] is a 74 y.o. male who presents for follow-up on C. difficile.  Patient was last seen in our office 11/05/2019 for C. difficile, ulcerative pancolitis without complication, diarrhea.  Previously seen Dr. Lucio Edward with [facility].  Has been seen for C. difficile, UC, hemorrhoids.  On Plavix and Pradaxa for TIA, CVA, CAD, A. fib, CHF, diabetes.  Ulcerative colitis previously managed on Apriso 1.5 g daily.  Diagnosed with C. difficile in July 2020 and given 2 weeks of vancomycin and Florastor.  Recurrent loose stools and was given a vancomycin taper.  Again had recurrent stools and was given Dificid 200 mg twice daily for 10 days.  Referred to infectious disease.  Recurrent stools after that were negative for C. difficile GDH antigen as well as toxins a and B.  Baseline stools are twice daily.  Unfortunately he became infected with COVID-19 in December 2020 and was given monoclonal antibody infusion.  He did eventually see infectious disease who indicated improvement after monoclonal antibody infusion for COVID-19, diagnosis of recurrent C. difficile and finished taper it mid-November recommended monitor stools.  Abdominal cramping and loose stools did improve with Bentyl and recommended continue this.  At his last visit he is doing much better although still has multiple stools a day with no diarrhea on Bentyl.  He has also eliminated dairy from his diet.  Drinking Pedialyte rather than carbonated beverages.  No other overt GI complaints.  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  Recommended continue Bentyl as needed for diarrhea, continue probiotics, continue Apriso for ulcerative pancolitis, follow-up in 6 months.  Previous colonoscopy completed  12/06/2010 which found consistent with ulcerative pancolitis, no active bleeding or blood, no polyps or cancers, colitis found to be in remission on p.o. amino salicylates.  Recommended repeat colonoscopy in 5 years (2017).  Today he states he is doing okay overall.  He needs a refill of his dental medication. Currently his stools are doing well overall. Has 3 stools a day, formed on dicyclomine which he takes bid. Still on Florastor as well. Denies abdominal pain, N/V, hematochezia, melena, fever, chills, unintentional weight loss. Appetite is good, energy is good ("for a 74 year old"). Denies rashes or mouth sores. Still on Apriso. Denies URI or flu-like symptoms. Denies loss of sense of taste or smell. The patient has received COVID-19 vaccination(s). Denies chest pain, dyspnea, dizziness, lightheadedness, syncope, near syncope. Denies any other upper or lower GI symptoms.  Past Medical History:  Diagnosis Date  . ARF (acute renal failure) ([place]) 04/15/2019  . Atherosclerotic heart disease of native coronary artery with other forms of angina pectoris ([place]) 04/26/2019  . Atrial fibrillation ([place])    initial diagnoses 2012  . B12 deficiency    per patient previously taking shots  . C. difficile colitis 04/17/2019  . C. difficile diarrhea   . CAD (coronary artery disease), native coronary artery 04/12/2007   History of MI. 2 stents. Plavix. Imdur 13m 10/28/15 cath Dr. CBurt Knack Findings: severe ostial LCx stenosis, patent LAD stent, patent dominant RCA supplies diagonal collateral.    . Chronic combined systolic and diastolic CHF (congestive heart failure) ([place] 04/15/2019  . CORONARY ARTERY DISEASE 04/12/2007   2  stents last in 2006.   Marland Kitchen Cystoid macular edema of right eye 01/16/2020  . Cystoid macular edema of right eye 01/16/2020  . DIABETES MELLITUS, TYPE II 04/16/2007  . Diverticulosis of colon (without mention of hemorrhage)   . GERD (gastroesophageal reflux disease) 08/09/2011  . HYPERLIPIDEMIA 04/16/2007    reveal study. not sure of medication  . Hyperlipidemia 04/16/2007   10/24 reveal study end. Atorvastatin 68m  . HYPERTENSION 04/12/2007  . HYPOTHYROIDISM 04/12/2007  . Hypothyroidism 04/12/2007   Synthroid 516m   . Ischemic cardiomyopathy    cath 35-40%  . Morbid [place] ([facility] [place]  . MYOCARDIAL INFARCTION, HX OF 04/12/2007  . OBESITY 06/16/2009  . Obstructive sleep apnea 04/12/2007   02/2011 - AHI 96/h CPAP 13, Lg FF     . SLEEP APNEA 04/12/2007   CPAP  . Stroke ([facility]  . Type II diabetes mellitus with peripheral circulatory disorder ([facility] [place]/04/2007   Dr. GhCruzita Ledereranages. Uses fructosamine.    . Marland KitchenLCERATIVE COLITIS, LEFT SIDED 11/23/2010  . Ulcerative pancolitis without complication ([id number]/[id number] Ulcerative Colitis. Mesalamine.   . Marland KitchenITAMIN B12 DEFICIENCY 11/29/2010    Past Surgical History:  Procedure Laterality Date  . CARDIAC CATHETERIZATION  10/2002   STENT. 2 stents Dr. BrMaurene Capes. CARDIAC CATHETERIZATION N/A 10/28/2015   Procedure: Right/Left Heart Cath and Coronary Angiography;  Surgeon: MiSherren MochaMD;  [facility];  Service: Cardiovascular;  Laterality: N/A;  . CATARACT EXTRACTION     2021 bilateral eyes   . CORONARY STENT PLACEMENT  2008   LAD   . RIGHT/LEFT HEART CATH AND CORONARY ANGIOGRAPHY N/A 06/01/2018   Procedure: RIGHT/LEFT HEART CATH AND CORONARY ANGIOGRAPHY;  Surgeon: CoSherren MochaMD;  [facility];  Service: Cardiovascular;  Laterality: N/A;  . TONSILLECTOMY      Current Outpatient Medications  Medication Sig Dispense Refill  . acetaminophen (TYLENOL) 500 MG tablet Take 1,000 mg by mouth as needed for headache (pain).     . APRISO 0.375 g 24 hr capsule TAKE 4 CAPSULES BY MOUTH DAILY 120 capsule 5  . atorvastatin (LIPITOR) 20 MG tablet TAKE ONE TABLET (20MG TOTAL) BY MOUTH DAILY 90 tablet 3  . carvedilol (COREG) 25 MG tablet TAKE ONE TABLET BY MOUTH TWICE A DAY 180 tablet 3  . clopidogrel (PLAVIX) 75 MG tablet TAKE ONE  TABLET (75MG TOTAL) BY MOUTH DAILY 90 tablet 3  . Coenzyme Q10 (COQ10 PO) Take 1 capsule by mouth daily with supper.    . dabigatran (PRADAXA) 150 MG CAPS capsule Take 1 capsule (150 mg total) by mouth 2 (two) times daily. 60 capsule 5  . diclofenac Sodium (VOLTAREN) 1 % GEL Apply 1 application topically as needed (knee pain).     . Marland Kitchenicyclomine (BENTYL) 10 MG capsule Take 1 capsule (10 mg total) by mouth 2 (two) times daily as needed (Diarrhea or abdominal cramping). 90 capsule 0  . empagliflozin (JARDIANCE) 10 MG TABS tablet TAKE ONE (1) TABLET BY MOUTH EVERY DAY 90 tablet 3  . ENTRESTO 97-103 MG TAKE ONE TABLET BY MOUTH TWICE A DAY 60 tablet 2  . fish oil-omega-3 fatty acids 1000 MG capsule Take 1 g by mouth daily with supper.     . furosemide (LASIX) 40 MG tablet TAKE ONE TABLET BY MOUTH EVERY DAY. 180 tablet 1  . glipiZIDE (GLUCOTROL) 10 MG tablet Take 1 tablet (10 mg total) by mouth 2 (two) times daily before a meal. 180 tablet 3  .  glucosamine-chondroitin 500-400 MG tablet Take 1 tablet by mouth daily as needed (knee flare up or pain).    Marland Kitchen glucose blood test strip Use as instructed to test 4 time daily 400 each 3  . isosorbide mononitrate (IMDUR) 60 MG 24 hr tablet TAKE ONE (1) TABLET BY MOUTH EVERY DAY 90 tablet 1  . saccharomyces boulardii (FLORASTOR) 250 MG capsule Take 1 capsule (250 mg total) by mouth 2 (two) times daily.    . sitaGLIPtin (JANUVIA) 100 MG tablet TAKE ONE (1) TABLET BY MOUTH EVERY DAY 90 tablet 3  . spironolactone (ALDACTONE) 25 MG tablet TAKE ONE (1) TABLET BY MOUTH EVERY DAY 90 tablet 3  . SYNTHROID 50 MCG tablet TAKE ONE TABLET BY MOUTH DAILY BEFORE BREAKFAST 90 tablet 3  . DIFICID 200 MG TABS tablet Take 200 mg by mouth 2 (two) times daily as needed (stomach).  (Patient not taking: Reported on 07/07/2020)     No current facility-administered medications for this visit.    Allergies as of 07/07/2020 - Review Complete 07/07/2020  Allergen Reaction Noted  .  Clindamycin/lincomycin  04/26/2019    Family History  Problem Relation Age of Onset  . Heart attack Mother        mid 87s  . Heart disease Father        H/O CAD, CABG, VALVE SURGERY  . Hypertension Brother   . Obesity Brother   . Stomach cancer Maternal Aunt   . Stomach cancer Paternal Grandmother        ? colon   . Colon cancer Neg Hx     Social History   Socioeconomic History  . Marital status: Married    Spouse name: Not on file  . Number of children: 1  . Years of education: Not on file  . Highest education level: Not on file  Occupational History  . Occupation: RETIRED    Employer: RETIRED  Tobacco Use  . Smoking status: Former Smoker    Packs/day: 1.00    Years: 5.00    Pack years: 5.00    Types: Cigarettes    Quit date: 04/13/1966    Years since quitting: 54.2  . Smokeless tobacco: Never Used  Vaping Use  . Vaping Use: Never used  Substance and Sexual Activity  . Alcohol use: No    Alcohol/week: 0.0 standard drinks    Comment: no more beer  . Drug use: No  . Sexual activity: Not on file  Other Topics Concern  . Not on file  Social History Narrative   Cardiorehab 3 days a week. 45 minutes to an hour-stationary bike.    GRANDDAUGHTER (MS. 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       Married for 37 years in 2015, married previously for 14 years. Daughter with first wife and 3 grandkids.    Lives alone with wife. Get to see grandkids a lot. New grandchild in middle of 98      Hobbies-yardwork, previously liked to hunt and fish, does some target shooting   Social Determinants of Health   Financial Resource Strain:   . Difficulty of Paying Living Expenses: Not on file  Food Insecurity:   . Worried About Charity fundraiser in the Last Year: Not on file  . Ran Out of Food in the Last Year: Not on file  Transportation Needs:   .  Lack of Transportation  (Medical): Not on file  . Lack of Transportation (Non-Medical): Not on file  Physical Activity:   . Days of Exercise per Week: Not on file  . Minutes of Exercise per Session: Not on file  Stress:   . Feeling of Stress : Not on file  Social Connections:   . Frequency of Communication with Friends and Family: Not on file  . Frequency of Social Gatherings with Friends and Family: Not on file  . Attends Religious Services: Not on file  . Active Member of Clubs or Organizations: Not on file  . Attends Archivist Meetings: Not on file  . Marital Status: Not on file    Subjective: Review of Systems  Constitutional: Negative for chills, fever, malaise/fatigue and weight loss.  HENT: Negative for congestion and sore throat.   Respiratory: Negative for cough and shortness of breath.   Cardiovascular: Negative for chest pain and palpitations.  Gastrointestinal: Negative for abdominal pain, blood in stool, diarrhea, melena, nausea and vomiting.  Musculoskeletal: Negative for joint pain and myalgias.  Skin: Negative for rash.  Neurological: Negative for dizziness and weakness.  Endo/Heme/Allergies: Does not bruise/bleed easily.  Psychiatric/Behavioral: Negative for depression. The patient is not nervous/anxious.   All other systems reviewed and are negative.    Objective: BP (!) 196/109   Pulse 65   Temp (!) 96.9 F (36.1 C) (Temporal)   Ht 5' 7"  (1.702 m)   Wt 249 lb 12.8 oz (113.3 kg)   BMI 39.12 kg/m  Physical Exam Vitals and nursing note reviewed.  Constitutional:      General: He is not in acute distress.    Appearance: Normal appearance. He is not ill-appearing, toxic-appearing or diaphoretic.  HENT:     Head: Normocephalic and atraumatic.     Nose: No congestion or rhinorrhea.  Eyes:     General: No scleral icterus. Cardiovascular:     Rate and Rhythm: Normal rate and regular rhythm.     Heart sounds: Normal heart sounds.  Pulmonary:     Effort: Pulmonary  effort is normal.     Breath sounds: Normal breath sounds.  Abdominal:     General: Bowel sounds are normal. There is no distension.     Palpations: Abdomen is soft. There is no hepatomegaly, splenomegaly or mass.     Tenderness: There is no abdominal tenderness. There is no guarding or rebound.     Hernia: No hernia is present.  Musculoskeletal:     Cervical back: Neck supple.  Skin:    General: Skin is warm and dry.     Coloration: Skin is not jaundiced.     Findings: No bruising or rash.  Neurological:     General: No focal deficit present.     Mental Status: He is alert and oriented to person, place, and time. Mental status is at baseline.  Psychiatric:        Mood and Affect: Mood normal.        Behavior: Behavior normal.        Thought Content: Thought content normal.      Assessment:  Very pleasant 74 year old male with a history of ulcerative pancolitis historically well managed on Apriso, recurrent C. difficile treated with vancomycin, vancomycin taper, and Dificid with essential resolution.  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  Overall he is doing quite  well today  Ulcerative colitis: Continue Apriso, generally well controlled on oral amino salicylates.  He is due for repeat colonoscopy and we decided to discuss this at his next office visit.  This would likely be his last colonoscopy  C. difficile history: No progressive or severe diarrhea that would concern me for recurrent C. difficile.  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  Overall he states he feels well  Postinfectious IBS with diarrhea: He has been quite well managed on Florastor probiotic and Bentyl 10 mg twice daily as needed.  He needs a refill of his medication which I will send to his pharmacy.  Recommend he continue his current medications  seem to be doing well for him.   Plan: 1. Continue Florastor and Bentyl 2. Follow-up in 6 months 3. Notify us of any worsening symptoms 4. Discuss colonoscopy at follow-up    Thank you for allowing [place] to participate in the care of Andrew Ket, DNP, AGNP-C Adult & Gerontological Nurse Practitioner [facility]   07/07/2020 4:18 PM   Disclaimer: This note was dictated with voice recognition software. Similar sounding words can inadvertently be transcribed and may not be corrected upon review.

## 2020-07-08 ENCOUNTER — Other Ambulatory Visit: Payer: Self-pay

## 2020-07-08 ENCOUNTER — Encounter: Payer: Self-pay | Admitting: Internal Medicine

## 2020-07-08 ENCOUNTER — Ambulatory Visit (INDEPENDENT_AMBULATORY_CARE_PROVIDER_SITE_OTHER): Payer: Medicare Other | Admitting: Internal Medicine

## 2020-07-08 VITALS — BP 122/60 | HR 91 | Ht 67.0 in | Wt 246.0 lb

## 2020-07-08 DIAGNOSIS — E039 Hypothyroidism, unspecified: Secondary | ICD-10-CM | POA: Diagnosis not present

## 2020-07-08 DIAGNOSIS — Z23 Encounter for immunization: Secondary | ICD-10-CM

## 2020-07-08 DIAGNOSIS — E785 Hyperlipidemia, unspecified: Secondary | ICD-10-CM | POA: Diagnosis not present

## 2020-07-08 DIAGNOSIS — E1151 Type 2 diabetes mellitus with diabetic peripheral angiopathy without gangrene: Secondary | ICD-10-CM | POA: Diagnosis not present

## 2020-07-08 DIAGNOSIS — I2510 Atherosclerotic heart disease of native coronary artery without angina pectoris: Secondary | ICD-10-CM

## 2020-07-08 LAB — POCT GLYCOSYLATED HEMOGLOBIN (HGB A1C): Hemoglobin A1C: 6.3 % — AB (ref 4.0–5.6)

## 2020-07-08 NOTE — Patient Instructions (Addendum)
Please continue:    - Glipizide 10 mg 2x a day - Metformin 500 mg 2x a day - Januvia 100 mg daily  - Jardiance 10 mg before breakfast every day  Please return in 6 months with your sugar log.

## 2020-07-08 NOTE — Progress Notes (Signed)
Patient ID: [name], male   DOB: [birth year], 74 y.o.   MRN: [id number]  This visit occurred during the SARS-CoV-2 public health emergency.  Safety protocols were in place, including screening questions prior to the visit, additional usage of staff PPE, and extensive cleaning of exam room while observing appropriate contact time as indicated for disinfecting solutions.   HPI: [name] is a 74 y.o.-year-old male, returning for DM2, dx 2004, non-insulin-dependent, uncontrolled, with complications (CAD, s/p MI). Last visit 4 months ago. He has [facility] as supplemental and [employer] for meds in 10/2015.  Reviewed HbA1c levels: Lab Results  Component Value Date   HGBA1C 6.8 (H) 02/24/2020   HGBA1C 5.9 (A) 11/06/2019   HGBA1C 6.9 (H) 04/26/2019  02/27/2019: HbA1c calculated from fructosamine is better: 6.45%. 10/24/2018: HbA1c calculated from fructosamine is slightly higher than the one from 02/2018, at 6.6%. 02/13/2018: The HbA1c calculated from the fructosamine is excellent, at 6.3%! 10/16/2017: HbA1c calculated from fructosamine is: 7.3% (higher). 06/16/2017: HbA1c calculated from the fructosamine is stable, at 6.8%.  12/08/2016: HbA1c calculated from the fructosamine is better, at 6.86%. 08/10/2016: HbA1c calculated from  fructosamine is 7.88% 05/10/2016: HbA1c calculated from fructosamine is 7.6% 01/06/2016: HbA1c calculated from fructosamine is 6.6% 09/06/2016: HbA1c calculated from fructosamine is 7.0% 06/01/2015: HbA1c calculated from fructosamine is 7.0% 02/23/2015: HbA1c calculated from fructosamine is 6.88% 10/15/2014: HbA1c calculated from fructosamine is 7.17% He started to change his eating habits since 03/2013.   He is on: - Glipizide 10 mg 2x a day - Metformin 1000 mg 2x a day >> 500 mg 2x a day  - Januvia 100 mg daily  - Jardiance 10 mg in a.m. every other day due to increased urination >> now every day starting 02/2019 He does not miss doses.  Pt checks his  sugars 4 times a day per his excellent log: - am: 116-136 >> 92-118 >> 101-130 >> 119-134, 137 - 2h after breakfast: 120-146, 151 >> 130-164 >> 144-164 - before lunch 144 >> 110-120s >> n/c  - before dinner:  97-117 >> 97-122 >> 110-123 - 2h after dinner:  116-149 >> 133-147 >> 141-159 He has hypoglycemia awareness in the 60s. Highest: 287 >> .Marland KitchenMarland Kitchen 151 >> 164 >> 164  His wife is a Radiographer, therapeutic >> he does eat sweets. He saw [name] (nutritionist).  -+ Mild CKD, last BUN/creatinine:  Lab Results  Component Value Date   BUN 21 02/24/2020   CREATININE 1.37 02/24/2020  On Entresto.  -+ HL:  last set of lipids: Lab Results  Component Value Date   CHOL 84 02/24/2020   HDL 51.60 02/24/2020   LDLCALC 12 02/24/2020   LDLDIRECT 18.0 06/13/2017   TRIG 105.0 02/24/2020   CHOLHDL 2 02/24/2020  He was in the Reveal Study x 4 years >> stopped. On Lipitor 20 and omega-3 fatty acids.  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 Zadie Rhine.  He has a history of cataract surgery  - no numbness and tingling in his feet.  He is compliant with CPAP for OSA, also has A. Fib-on Plavix and pradaxa, IBD -ulcerative pancolitis, GERD, also, obesity.  In 2019 she was also diagnosed with CHF -EF of 25 to 30%.  His Lasix was increased to twice a day(takes the second dose as needed). Cardiac cath >> no significant obstruction.  He was started on Entresto.  He has a history of  very low vitamin B12: Lab Results  Component Value Date   VITAMINB12 103 (L) 02/24/2020   VITAMINB12 267 05/01/2012   VITAMINB12 201 (L) 11/23/2010   She was started on B12 injections q. weekly for 4 weeks then 1000 mcg p.o. daily by PCP  Hypothyroidism:  Pt is on levothyroxine 50 mcg daily, taken: - in am - fasting - at least 30 min from b'fast - no Ca, Fe, MVI, PPIs - not on Biotin  Latest TSH was reviewed and this was  normal: Lab Results  Component Value Date   TSH 1.45 02/24/2020   Pt denies: - feeling nodules in neck - hoarseness - dysphagia - choking - SOB with lying down  ROS: Constitutional: no weight gain/no weight loss, no fatigue, no subjective hyperthermia, no subjective hypothermia Eyes: no blurry vision, no xerophthalmia ENT: no sore throat, + see HPI Cardiovascular: no CP/no SOB/no palpitations/no leg swelling Respiratory: no cough/no SOB/no wheezing Gastrointestinal: no N/no V/no D/no C/no acid reflux Musculoskeletal: no muscle aches/no joint aches Skin: no rashes, no hair loss Neurological: no tremors/no numbness/no tingling/no dizziness  I reviewed pt's medications, allergies, PMH, social hx, family hx, and changes were documented in the history of present illness. Otherwise, unchanged from my initial visit note.  Past Medical History:  Diagnosis Date  . ARF (acute renal failure) ([place]) 04/15/2019  . Atherosclerotic heart disease of native coronary artery with other forms of angina pectoris ([place]) 04/26/2019  . Atrial fibrillation ([place])    initial diagnoses 2012  . B12 deficiency    per patient previously taking shots  . C. difficile colitis 04/17/2019  . C. difficile diarrhea   . CAD (coronary artery disease), native coronary artery 04/12/2007   History of MI. 2 stents. Plavix. Imdur 26m 10/28/15 cath Dr. CBurt Knack Findings: severe ostial LCx stenosis, patent LAD stent, patent dominant RCA supplies diagonal collateral.    . Chronic combined systolic and diastolic CHF (congestive heart failure) ([place] 04/15/2019  . CORONARY ARTERY DISEASE 04/12/2007   2 stents last in 2006.   .Marland KitchenCystoid macular edema of right eye 01/16/2020  . Cystoid macular edema of right eye 01/16/2020  . DIABETES MELLITUS, TYPE II 04/16/2007  . Diverticulosis of colon (without mention of hemorrhage)   . GERD (gastroesophageal reflux disease) 08/09/2011  . HYPERLIPIDEMIA 04/16/2007   reveal study. not sure of medication  .  Hyperlipidemia 04/16/2007   10/24 reveal study end. Atorvastatin 22m . HYPERTENSION 04/12/2007  . HYPOTHYROIDISM 04/12/2007  . Hypothyroidism 04/12/2007   Synthroid 5063m  . Ischemic cardiomyopathy    cath 35-40%  . Morbid obesity ([place]/30/2012  . MYOCARDIAL INFARCTION, HX OF 04/12/2007  . OBESITY 06/16/2009  . Obstructive sleep apnea 04/12/2007   02/2011 - AHI 96/h CPAP 13, Lg FF     . SLEEP APNEA 04/12/2007   CPAP  . Stroke ([place] . Type II diabetes mellitus with peripheral circulatory disorder ([place]/04/2007   Dr. GheCruzita Lederernages. Uses fructosamine.    . UMarland KitchenCERATIVE COLITIS, LEFT SIDED 11/23/2010  . Ulcerative pancolitis without complication ([place]CC[place]0/50/53/9767Ulcerative Colitis. Mesalamine.   . VMarland KitchenTAMIN B12 DEFICIENCY 11/29/2010   Past Surgical History:  Procedure Laterality Date  . CARDIAC CATHETERIZATION  10/2002   STENT. 2 stents Dr. BroMaurene Capes CARDIAC CATHETERIZATION N/A 10/28/2015   Procedure: Right/Left Heart Cath and Coronary Angiography;  Surgeon: MicSherren MochaD;  Location: [facility];  Service: Cardiovascular;  Laterality: N/A;  . CATARACT EXTRACTION  2021 bilateral eyes   . CORONARY STENT PLACEMENT  2008   LAD   . RIGHT/LEFT HEART CATH AND CORONARY ANGIOGRAPHY N/A 06/01/2018   Procedure: RIGHT/LEFT HEART CATH AND CORONARY ANGIOGRAPHY;  Surgeon: Sherren Mocha, MD;  Location: [facility];  Service: Cardiovascular;  Laterality: N/A;  . TONSILLECTOMY     Social History   Socioeconomic History  . Marital status: Married    Spouse name: Not on file  . Number of children: 1  . Years of education: Not on file  . Highest education level: Not on file  Occupational History  . Occupation: RETIRED    Employer: RETIRED  Tobacco Use  . Smoking status: Former Smoker    Packs/day: 1.00    Years: 5.00    Pack years: 5.00    Types: Cigarettes    Quit date: 04/13/1966    Years since quitting: 54.2  . Smokeless tobacco: Never Used  Vaping Use  . Vaping Use: Never  used  Substance and Sexual Activity  . Alcohol use: No    Alcohol/week: 0.0 standard drinks    Comment: no more beer  . Drug use: No  . Sexual activity: Not on file  Other Topics Concern  . Not on file  Social History Narrative   Cardiorehab 3 days a week. 45 minutes to an hour-stationary bike.    GRANDDAUGHTER (MS. 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       Married for 37 years in 2015, married previously for 14 years. Daughter with first wife and 3 grandkids.    Lives alone with wife. Get to see grandkids a lot. New grandchild in middle of 20      Hobbies-yardwork, previously liked to hunt and fish, does some target shooting   Social Determinants of Health   Financial Resource Strain:   . Difficulty of Paying Living Expenses: Not on file  Food Insecurity:   . Worried About Charity fundraiser in the Last Year: Not on file  . Ran Out of Food in the Last Year: Not on file  Transportation Needs:   . Lack of Transportation (Medical): Not on file  . Lack of Transportation (Non-Medical): Not on file  Physical Activity:   . Days of Exercise per Week: Not on file  . Minutes of Exercise per Session: Not on file  Stress:   . Feeling of Stress : Not on file  Social Connections:   . Frequency of Communication with Friends and Family: Not on file  . Frequency of Social Gatherings with Friends and Family: Not on file  . Attends Religious Services: Not on file  . Active Member of Clubs or Organizations: Not on file  . Attends Archivist Meetings: Not on file  . Marital Status: Not on file  Intimate Partner Violence:   . Fear of Current or Ex-Partner: Not on file  . Emotionally Abused: Not on file  . Physically Abused: Not on file  . Sexually Abused: Not on file   Current Outpatient Medications on File Prior to Visit  Medication Sig Dispense Refill  . APRISO 0.375 g  24 hr capsule TAKE 4 CAPSULES BY MOUTH DAILY 120 capsule 5  . atorvastatin (LIPITOR) 20 MG tablet TAKE ONE TABLET (20MG TOTAL) BY MOUTH DAILY 90 tablet 3  . carvedilol (COREG) 25 MG tablet TAKE ONE TABLET  BY MOUTH TWICE A DAY 180 tablet 3  . clopidogrel (PLAVIX) 75 MG tablet TAKE ONE TABLET (75MG TOTAL) BY MOUTH DAILY 90 tablet 3  . Coenzyme Q10 (COQ10 PO) Take 1 capsule by mouth daily with supper.    . dabigatran (PRADAXA) 150 MG CAPS capsule Take 1 capsule (150 mg total) by mouth 2 (two) times daily. 60 capsule 5  . diclofenac Sodium (VOLTAREN) 1 % GEL Apply 1 application topically as needed (knee pain).     Marland Kitchen dicyclomine (BENTYL) 10 MG capsule Take 1 capsule (10 mg total) by mouth 2 (two) times daily as needed (Diarrhea or abdominal cramping). 90 capsule 0  . empagliflozin (JARDIANCE) 10 MG TABS tablet TAKE ONE (1) TABLET BY MOUTH EVERY DAY 90 tablet 3  . ENTRESTO 97-103 MG TAKE ONE TABLET BY MOUTH TWICE A DAY 60 tablet 2  . fish oil-omega-3 fatty acids 1000 MG capsule Take 1 g by mouth daily with supper.     . furosemide (LASIX) 40 MG tablet TAKE ONE TABLET BY MOUTH EVERY DAY. 180 tablet 1  . glipiZIDE (GLUCOTROL) 10 MG tablet Take 1 tablet (10 mg total) by mouth 2 (two) times daily before a meal. 180 tablet 3  . glucosamine-chondroitin 500-400 MG tablet Take 1 tablet by mouth daily as needed (knee flare up or pain).    Marland Kitchen glucose blood test strip Use as instructed to test 4 time daily 400 each 3  . isosorbide mononitrate (IMDUR) 60 MG 24 hr tablet TAKE ONE (1) TABLET BY MOUTH EVERY DAY 90 tablet 1  . saccharomyces boulardii (FLORASTOR) 250 MG capsule Take 1 capsule (250 mg total) by mouth 2 (two) times daily.    . sitaGLIPtin (JANUVIA) 100 MG tablet TAKE ONE (1) TABLET BY MOUTH EVERY DAY 90 tablet 3  . spironolactone (ALDACTONE) 25 MG tablet TAKE ONE (1) TABLET BY MOUTH EVERY DAY 90 tablet 3  . SYNTHROID 50 MCG tablet TAKE ONE TABLET BY MOUTH DAILY BEFORE BREAKFAST 90 tablet 3   No current  facility-administered medications on file prior to visit.   Allergies  Allergen Reactions  . Clindamycin/Lincomycin     Developed C. difficile colitis 1 month after use   Family History  Problem Relation Age of Onset  . Heart attack Mother        mid 28s  . Heart disease Father        H/O CAD, CABG, VALVE SURGERY  . Hypertension Brother   . Obesity Brother   . Stomach cancer Maternal Aunt   . Stomach cancer Paternal Grandmother        ? colon   . Colon cancer Neg Hx     PE: BP 122/60   Pulse 91   Ht 5' 7"  (1.702 m)   Wt 246 lb (111.6 kg)   SpO2 95%   BMI 38.53 kg/m  Body mass index is 38.53 kg/m.  Wt Readings from Last 3 Encounters:  07/08/20 246 lb (111.6 kg)  07/07/20 249 lb 12.8 oz (113.3 kg)  04/08/20 247 lb 3.2 oz (112.1 kg)   Constitutional: overweight, in NAD Eyes: PERRLA, EOMI, no exophthalmos ENT: moist mucous membranes, no thyromegaly, no cervical lymphadenopathy Cardiovascular: RRR, No MRG, + B LE edema L>R Respiratory: CTA B Gastrointestinal: abdomen soft, NT, ND, BS+ Musculoskeletal: no deformities, strength intact in all 4 Skin: moist, warm, no rashes Neurological: no tremor with outstretched hands, DTR normal in all 4  ASSESSMENT: 1. DM2, non-insulin-dependent, uncontrolled, with complications - CAD, s/p MI  10/2006 - s/p stent - sees [name]  2. Obesity class 3 BMI Classification:  < 18.5 underweight   18.5-24.9 normal weight   25.0-29.9 overweight   30.0-34.9 class I obesity   35.0-39.9 class II obesity   ? 40.0 class III obesity   3. HL  4.  Hypothyroidism  PLAN:  1. Patient with longstanding, controlled diabetes, on oral antidiabetic regimen with Metformin, sulfonylurea, DPP 4 inhibitor, SGLT2 inhibitor.  He decreased the dose of Metformin after losing weight following his C. difficile infection in 2020.  We did not increase the dose afterwards since his sugars appeared controlled.  At last visit, HbA1c was higher, at 6.8% but  sugars were still at goal, only slightly higher than before.  We did not change his regimen at that time.  He was working towards improving his diet.  He had a weight gain of 9 pounds then after going to the beach for his granddaughter's wedding. -At today's visit, sugars remain at goal per review of his excellent log.  He occasionally has blood sugars slightly above goal (mostly with dietary indiscretions-when eating cake), but overall, no frank hyper or hypoglycemia.  He was more active since last visit, but he did not develop any low blood sugars with activity.   -No need to change his regimen for now. - I advised him to Patient Instructions  Please continue:    - Glipizide 10 mg 2x a day - Metformin 500 mg 2x a day - Januvia 100 mg daily  - Jardiance 10 mg before breakfast every day  Please return in 4 months with your sugar log.   - we checked his HbA1c: 6.3% (improved) - advised to check sugars at different times of the day - 1x a day, rotating check times - advised for yearly eye exams >> he is UTD - return to clinic in 6 months  2. HL -Reviewed latest lipid panel from 02/2020: All fractions at goal: Lab Results  Component Value Date   CHOL 84 02/24/2020   HDL 51.60 02/24/2020   LDLCALC 12 02/24/2020   LDLDIRECT 18.0 06/13/2017   TRIG 105.0 02/24/2020   CHOLHDL 2 02/24/2020  -Continues Lipitor 20 and omega-3 fatty acids without side effects  3.  Hypothyroidism - latest thyroid labs reviewed with pt >> normal: Lab Results  Component Value Date   TSH 1.45 02/24/2020   - he continues on LT4 50 mcg daily - pt feels good on this dose. - we discussed about taking the thyroid hormone every day, with water, >30 minutes before breakfast, separated by >4 hours from acid reflux medications, calcium, iron, multivitamins. Pt. is taking it correctly.  Philemon Kingdom, MD PhD [facility] Endocrinology

## 2020-07-08 NOTE — Addendum Note (Signed)
Addended by: Cardell Peach I on: 07/08/2020 11:35 AM   Modules accepted: Orders

## 2020-08-07 ENCOUNTER — Other Ambulatory Visit: Payer: Self-pay | Admitting: Gastroenterology

## 2020-08-07 ENCOUNTER — Other Ambulatory Visit (HOSPITAL_COMMUNITY): Payer: Self-pay | Admitting: Internal Medicine

## 2020-08-10 ENCOUNTER — Telehealth (HOSPITAL_COMMUNITY): Payer: Self-pay | Admitting: Cardiology

## 2020-08-10 NOTE — Telephone Encounter (Signed)
-----   Message from Scarlette Calico, RN sent at 08/10/2020  1:02 PM EDT ----- Marykay Lex, he needs appt w/DB or APP, it's not urgent just routine f/u so can be in Jan please, thanks

## 2020-08-11 ENCOUNTER — Telehealth: Payer: Self-pay

## 2020-08-11 NOTE — Telephone Encounter (Signed)
[facility] called to get a refill of Apriso..375%. 4 pills daily.

## 2020-08-19 ENCOUNTER — Other Ambulatory Visit: Payer: Self-pay | Admitting: Gastroenterology

## 2020-08-19 DIAGNOSIS — K5100 Ulcerative (chronic) pancolitis without complications: Secondary | ICD-10-CM

## 2020-08-19 MED ORDER — APRISO 0.375 G PO CP24: 1.5000 g | ORAL_CAPSULE | Freq: Every day | ORAL | 5 refills | Status: DC

## 2020-08-19 NOTE — Telephone Encounter (Signed)
Rx has been sent  

## 2020-08-19 NOTE — Telephone Encounter (Signed)
Noted  

## 2020-09-01 ENCOUNTER — Other Ambulatory Visit: Payer: Self-pay | Admitting: Nurse Practitioner

## 2020-09-01 DIAGNOSIS — K5100 Ulcerative (chronic) pancolitis without complications: Secondary | ICD-10-CM

## 2020-09-01 DIAGNOSIS — A0472 Enterocolitis due to Clostridium difficile, not specified as recurrent: Secondary | ICD-10-CM

## 2020-09-01 DIAGNOSIS — R197 Diarrhea, unspecified: Secondary | ICD-10-CM

## 2020-09-01 NOTE — Patient Instructions (Addendum)
Health Maintenance Due  Topic Date Due   FOOT EXAM - wear regular socks next time and we will complete 04/25/2020   I think the booster is great choice for you for moderna.   Recommend scheduling annual wellness visit with our nurse specialist Otila Kluver  Please stop by lab before you go If you have mychart- we will send your results within 3 business days of [place] receiving them.  If you do not have mychart- we will call you about results within 5 business days of [place] receiving them.  *please note we are currently using [facility] which has a longer processing time than [place] typically so labs may not come back as quickly as in the past *please also note that you will see labs on mychart as soon as they post. I will later go in and write notes on them- will say "notes from Dr. Yong Channel"

## 2020-09-01 NOTE — Progress Notes (Signed)
Phone [phone] In person visit   Subjective:   [name] is a 74 y.o. year old very pleasant male patient who presents for/with See problem oriented charting Chief Complaint  Patient presents with   Hypertension    120/70 is what his BP is running at home. states he takes it from time to time    Hyperlipidemia   Dicyclomine    patient states that he needs a refill.    This visit occurred during the SARS-CoV-2 public health emergency.  Safety protocols were in place, including screening questions prior to the visit, additional usage of staff PPE, and extensive cleaning of exam room while observing appropriate contact time as indicated for disinfecting solutions.   Past Medical History-  Patient Active Problem List   Diagnosis Date Noted   History of transient ischemic attack (TIA) 10/24/2015    Priority: High   CHF (congestive heart failure), NYHA class II ([place]) 05/09/2014    Priority: High   Ulcerative pancolitis without complication ([place]) 50/56/9794    Priority: High   Atrial fibrillation ([place]) 12/08/2010    Priority: High   Type II diabetes mellitus with peripheral circulatory disorder ([place]) 04/16/2007    Priority: High   CAD (coronary artery disease), native coronary artery 04/12/2007    Priority: High   Morbid obesity ([place]) 08/09/2011    Priority: Medium   Hyperlipidemia 04/16/2007    Priority: Medium   Hypothyroidism 04/12/2007    Priority: Medium   Essential hypertension 04/12/2007    Priority: Medium   Obstructive sleep apnea 04/12/2007    Priority: Medium   Former smoker 08/11/2016    Priority: Low   MCI (mild cognitive impairment) 01/19/2016    Priority: Low   Primary osteoarthritis of left knee 02/09/2015    Priority: Low   Allergic rhinitis 06/25/2012    Priority: Low   GERD (gastroesophageal reflux disease) 08/09/2011    Priority: Low   VITAMIN B12 DEFICIENCY 11/29/2010    Priority: Low   Moderate nonproliferative  diabetic retinopathy of both eyes ([place]) 01/16/2020   Atherosclerotic heart disease of native coronary artery with other forms of angina pectoris ([place]) 04/26/2019   C. difficile colitis 04/17/2019   Dehydration 04/15/2019   Diarrhea 04/15/2019   ARF (acute renal failure) ([place]) 04/15/2019   Chronic combined systolic and diastolic CHF (congestive heart failure) ([place]) 04/15/2019   Hypokalemia 04/15/2019   AKI (acute kidney injury) ([place])    Acute on chronic combined systolic and diastolic CHF (congestive heart failure) ([place]) 02/07/2018    Medications- reviewed and updated Current Outpatient Medications  Medication Sig Dispense Refill   APRISO 0.375 g 24 hr capsule Take 4 capsules (1.5 g total) by mouth daily. 120 capsule 5   atorvastatin (LIPITOR) 20 MG tablet TAKE ONE TABLET (20MG TOTAL) BY MOUTH DAILY 90 tablet 3   carvedilol (COREG) 25 MG tablet TAKE ONE TABLET BY MOUTH TWICE A DAY 180 tablet 3   clopidogrel (PLAVIX) 75 MG tablet TAKE ONE TABLET (75MG TOTAL) BY MOUTH DAILY 90 tablet 3   Coenzyme Q10 (COQ10 PO) Take 1 capsule by mouth daily with supper.     dabigatran (PRADAXA) 150 MG CAPS capsule Take 1 capsule (150 mg total) by mouth 2 (two) times daily. 60 capsule 5   diclofenac Sodium (VOLTAREN) 1 % GEL Apply 1 application topically as needed (knee pain).      dicyclomine (BENTYL) 10 MG capsule TAKE ONE CAPSULE (10MG TOTAL) BY MOUTH TWO TIMES DAILY AS NEEDED FOR DIARRHEA  OR ABDOMINAL CRAMPING 90 capsule 1   empagliflozin (JARDIANCE) 10 MG TABS tablet TAKE ONE (1) TABLET BY MOUTH EVERY DAY 90 tablet 3   fish oil-omega-3 fatty acids 1000 MG capsule Take 1 g by mouth daily with supper.      furosemide (LASIX) 40 MG tablet TAKE ONE TABLET BY MOUTH EVERY DAY. 180 tablet 1   glipiZIDE (GLUCOTROL) 10 MG tablet Take 1 tablet (10 mg total) by mouth 2 (two) times daily before a meal. 180 tablet 3   glucosamine-chondroitin 500-400 MG tablet Take 1 tablet by mouth daily as  needed (knee flare up or pain).     glucose blood test strip Use as instructed to test 4 time daily 400 each 3   isosorbide mononitrate (IMDUR) 60 MG 24 hr tablet TAKE ONE (1) TABLET BY MOUTH EVERY DAY 90 tablet 1   saccharomyces boulardii (FLORASTOR) 250 MG capsule Take 1 capsule (250 mg total) by mouth 2 (two) times daily.     sacubitril-valsartan (ENTRESTO) 97-103 MG Take 1 tablet by mouth 2 (two) times daily. MUST HAVE FOLLOW UP APPOINTMENT FOR FURTHER REFILLS 60 tablet 2   sitaGLIPtin (JANUVIA) 100 MG tablet TAKE ONE (1) TABLET BY MOUTH EVERY DAY 90 tablet 3   spironolactone (ALDACTONE) 25 MG tablet TAKE ONE (1) TABLET BY MOUTH EVERY DAY 90 tablet 3   SYNTHROID 50 MCG tablet TAKE ONE TABLET BY MOUTH DAILY BEFORE BREAKFAST 90 tablet 3   No current facility-administered medications for this visit.     Objective:  BP 138/78    Pulse 74    Temp 98.6 F (37 C) (Temporal)    Ht 5' 7"  (1.702 m)    Wt 242 lb 6.4 oz (110 kg)    SpO2 98%    BMI 37.97 kg/m  Gen: NAD, resting comfortably CV: irregularly irregular no murmurs rubs or gallops Lungs: CTAB no crackles, wheeze, rhonchi Ext: trace edema under compression stockings Skin: warm, dry    Assessment and Plan   #%CAD/atrial fibrillation/hyperlipidemia. Follows with Dr. Rayann Heman of cardiology S: Compliant with atorvastatin 54m-LDL has been at goal under 70  Patient follows with Dr. CBurt Knackas well as Dr. AJonathon Bellowscardiology visit June 2021  I have prescribed his Plavix-with his CAD history-he is compliant with this- yes .  He is also on Pradaxa for anticoagulation due to atrial fibrillation-reports compliance- yes .  Patient reports cardiology wants him on both medications.  Patient is not on rate control medication but not requring  No chest pain or shortness of breath even with spitting wood Lab Results  Component Value Date   CHOL 84 02/24/2020   HDL 51.60 02/24/2020   LDLCALC 12 02/24/2020   LDLDIRECT 18.0 06/13/2017    TRIG 105.0 02/24/2020   CHOLHDL 2 02/24/2020     A/P: CAD asymptomatic. A fib doing well without rate control- continue pradaxa for anticoagulation. HLD controlled. Continue all current medicines.      # Low b12- on sublingual b12- update levels- prior had been low  #hypertension/CHF S: Compliant with Coreg 25 mg twice a day, Imdur 60 mg, Entresto 97-103 BID, spironolactone 25 mg, Lasix 40 mg. jardiance also helps with CHF Home #s usually 120s over 70s or 80s A/P: HTN- High normal today but reasonably well plus home #s look great-controlled overall-continue current medication  CHF  Appears controlled  #% Hypothyroidism S: Compliant with Synthroid 50 mcg-most recently filled by Dr. GCruzita LedererLab Results  Component Value Date   TSH 1.45 02/24/2020  A/P: reasonable control- update TSH with labs  - I am willing to prescribe Synthroid if needed   % Ulcerative colitis S: Followed by GI- compliant with mesalamine/apriso.  Patient just got dicyclomine refill yesterday after C. Diff- having to take 2 a day or so- diarrhea otherwise. He is also on florastor. No blood in stool or abdominal cramping A/P:  Reasonable control- continue Gi follow up  #Diabetes-followed by Dr. Cruzita Lederer #Morbid obesity- Last A1c was well controlled thankfully. Down 2 lbs from last visit- trying to eat reasonably healthy  # Health maintenance- eligible for booster and he is planning to get this  Recommended follow up: Return in about 6 months (around 03/02/2021) for follow up- or sooner if needed. Future Appointments  Date Time Provider [place]  10/21/2020  3:15 PM [facility] PA/NP [facility] None  12/28/2020  1:00 PM Rankin, Clent Demark, MD RDE-RDE None  01/06/2021  9:00 AM Philemon Kingdom, MD [facility] None  01/06/2021  3:00 PM Carlis Stable, NP RGA-RGA RGA   Lab/Order associations:   ICD-10-CM   1. Essential hypertension  I10 CBC With Differential/Platelet    COMPLETE METABOLIC PANEL WITH GFR  2. Type II  diabetes mellitus with peripheral circulatory disorder (HCC)  E11.51   3. Gastroesophageal reflux disease, unspecified whether esophagitis present  K21.9   4. Hypothyroidism, unspecified type  E03.9 TSH  5. Hyperlipidemia, unspecified hyperlipidemia type  E78.5   6. B12 deficiency  E53.8 Vitamin B12   Return precautions advised.  Garret Reddish, MD

## 2020-09-02 ENCOUNTER — Ambulatory Visit (INDEPENDENT_AMBULATORY_CARE_PROVIDER_SITE_OTHER): Payer: Medicare Other | Admitting: Family Medicine

## 2020-09-02 ENCOUNTER — Encounter: Payer: Self-pay | Admitting: Family Medicine

## 2020-09-02 ENCOUNTER — Other Ambulatory Visit: Payer: Self-pay

## 2020-09-02 VITALS — BP 138/78 | HR 74 | Temp 98.6°F | Ht 67.0 in | Wt 242.4 lb

## 2020-09-02 DIAGNOSIS — E039 Hypothyroidism, unspecified: Secondary | ICD-10-CM

## 2020-09-02 DIAGNOSIS — K219 Gastro-esophageal reflux disease without esophagitis: Secondary | ICD-10-CM

## 2020-09-02 DIAGNOSIS — E1151 Type 2 diabetes mellitus with diabetic peripheral angiopathy without gangrene: Secondary | ICD-10-CM

## 2020-09-02 DIAGNOSIS — E538 Deficiency of other specified B group vitamins: Secondary | ICD-10-CM | POA: Diagnosis not present

## 2020-09-02 DIAGNOSIS — E785 Hyperlipidemia, unspecified: Secondary | ICD-10-CM | POA: Diagnosis not present

## 2020-09-02 DIAGNOSIS — I2510 Atherosclerotic heart disease of native coronary artery without angina pectoris: Secondary | ICD-10-CM

## 2020-09-02 DIAGNOSIS — I10 Essential (primary) hypertension: Secondary | ICD-10-CM

## 2020-09-03 ENCOUNTER — Encounter: Payer: Self-pay | Admitting: Family Medicine

## 2020-09-03 LAB — CBC WITH DIFFERENTIAL/PLATELET
Absolute Monocytes: 508 cells/uL (ref 200–950)
Basophils Absolute: 31 cells/uL (ref 0–200)
Basophils Relative: 0.4 %
Eosinophils Absolute: 108 cells/uL (ref 15–500)
Eosinophils Relative: 1.4 %
HCT: 53.6 % — ABNORMAL HIGH (ref 38.5–50.0)
Hemoglobin: 18.1 g/dL — ABNORMAL HIGH (ref 13.2–17.1)
Lymphs Abs: 724 cells/uL — ABNORMAL LOW (ref 850–3900)
MCH: 30.7 pg (ref 27.0–33.0)
MCHC: 33.8 g/dL (ref 32.0–36.0)
MCV: 91 fL (ref 80.0–100.0)
MPV: 10.9 fL (ref 7.5–12.5)
Monocytes Relative: 6.6 %
Neutro Abs: 6329 cells/uL (ref 1500–7800)
Neutrophils Relative %: 82.2 %
Platelets: 147 10*3/uL (ref 140–400)
RBC: 5.89 10*6/uL — ABNORMAL HIGH (ref 4.20–5.80)
RDW: 13.2 % (ref 11.0–15.0)
Total Lymphocyte: 9.4 %
WBC: 7.7 10*3/uL (ref 3.8–10.8)

## 2020-09-03 LAB — COMPLETE METABOLIC PANEL WITH GFR
AG Ratio: 1.8 (calc) (ref 1.0–2.5)
ALT: 9 U/L (ref 9–46)
AST: 13 U/L (ref 10–35)
Albumin: 4.2 g/dL (ref 3.6–5.1)
Alkaline phosphatase (APISO): 90 U/L (ref 35–144)
BUN: 14 mg/dL (ref 7–25)
CO2: 29 mmol/L (ref 20–32)
Calcium: 9.1 mg/dL (ref 8.6–10.3)
Chloride: 103 mmol/L (ref 98–110)
Creat: 1.04 mg/dL (ref 0.70–1.18)
GFR, Est African American: 82 mL/min/{1.73_m2} (ref >=60)
GFR, Est Non African American: 70 mL/min/{1.73_m2} (ref >=60)
Globulin: 2.4 g/dL (calc) (ref 1.9–3.7)
Glucose, Bld: 90 mg/dL (ref 65–99)
Potassium: 4.5 mmol/L (ref 3.5–5.3)
Sodium: 142 mmol/L (ref 135–146)
Total Bilirubin: 0.8 mg/dL (ref 0.2–1.2)
Total Protein: 6.6 g/dL (ref 6.1–8.1)

## 2020-09-03 LAB — TSH: TSH: 2.92 mIU/L (ref 0.40–4.50)

## 2020-09-03 LAB — VITAMIN B12: Vitamin B-12: 1256 pg/mL — ABNORMAL HIGH (ref 200–1100)

## 2020-09-08 DIAGNOSIS — Z23 Encounter for immunization: Secondary | ICD-10-CM | POA: Diagnosis not present

## 2020-09-16 ENCOUNTER — Other Ambulatory Visit (HOSPITAL_COMMUNITY): Payer: Self-pay | Admitting: Internal Medicine

## 2020-09-16 ENCOUNTER — Other Ambulatory Visit: Payer: Self-pay | Admitting: Cardiovascular Disease

## 2020-09-16 DIAGNOSIS — I4821 Permanent atrial fibrillation: Secondary | ICD-10-CM

## 2020-09-17 NOTE — Telephone Encounter (Signed)
Pradaxa 165m refill request received. Pt is 74years old, weight-110kg, Crea-1.04 on 09/02/2020, last seen by Cadence Furth on 04/08/2020 and has HF Clinic on 10/21/2020, Diagnosis-Afib, CrCl-96.960mmin; Dose is appropriate based on dosing criteria. Will send in refill to requested pharmacy.

## 2020-10-07 ENCOUNTER — Telehealth: Payer: Self-pay | Admitting: Family Medicine

## 2020-10-07 NOTE — Telephone Encounter (Signed)
Left message for patient to call back and schedule Medicare Annual Wellness Visit (AWV) either virtually OR in office.   Last AWV 08/26/19; please schedule at anytime with [facility]Nurse Health Advisor at [facility].  This should be a 45 minute visit.

## 2020-10-19 ENCOUNTER — Telehealth: Payer: Self-pay | Admitting: Family Medicine

## 2020-10-19 NOTE — Progress Notes (Signed)
  Chronic Care Management   Outreach Note  10/19/2020 Name: [name] MRN: [id number] DOB: 25-Jan-1946  Referred by: Marin Olp, MD Reason for referral : No chief complaint on file.   Third unsuccessful telephone outreach was attempted today. The patient was referred to the pharmacist for assistance with care management and care coordination.   Follow Up Plan:   Lauretta Grill Upstream Scheduler

## 2020-10-21 ENCOUNTER — Encounter (HOSPITAL_COMMUNITY): Payer: Medicare Other

## 2020-10-27 ENCOUNTER — Other Ambulatory Visit: Payer: Self-pay | Admitting: Cardiovascular Disease

## 2020-10-28 ENCOUNTER — Other Ambulatory Visit: Payer: Self-pay | Admitting: Gastroenterology

## 2020-10-28 DIAGNOSIS — R197 Diarrhea, unspecified: Secondary | ICD-10-CM

## 2020-10-28 DIAGNOSIS — A0472 Enterocolitis due to Clostridium difficile, not specified as recurrent: Secondary | ICD-10-CM

## 2020-10-28 DIAGNOSIS — K5100 Ulcerative (chronic) pancolitis without complications: Secondary | ICD-10-CM

## 2020-11-02 ENCOUNTER — Encounter (HOSPITAL_COMMUNITY): Payer: Self-pay

## 2020-11-02 ENCOUNTER — Other Ambulatory Visit: Payer: Self-pay

## 2020-11-02 ENCOUNTER — Other Ambulatory Visit: Payer: Self-pay | Admitting: Internal Medicine

## 2020-11-02 ENCOUNTER — Ambulatory Visit (HOSPITAL_COMMUNITY)
Admission: RE | Admit: 2020-11-02 | Discharge: 2020-11-02 | Disposition: A | Discharge status: Home / Self Care | Payer: Medicare Other | Source: Ambulatory Visit | Attending: Family Medicine | Admitting: Family Medicine

## 2020-11-02 VITALS — BP 155/99 | HR 80 | Wt 242.8 lb

## 2020-11-02 DIAGNOSIS — Z800 Family history of malignant neoplasm of digestive organs: Secondary | ICD-10-CM | POA: Insufficient documentation

## 2020-11-02 DIAGNOSIS — Z8349 Family history of other endocrine, nutritional and metabolic diseases: Secondary | ICD-10-CM | POA: Diagnosis not present

## 2020-11-02 DIAGNOSIS — Z791 Long term (current) use of non-steroidal anti-inflammatories (NSAID): Secondary | ICD-10-CM | POA: Insufficient documentation

## 2020-11-02 DIAGNOSIS — I255 Ischemic cardiomyopathy: Secondary | ICD-10-CM | POA: Insufficient documentation

## 2020-11-02 DIAGNOSIS — E1151 Type 2 diabetes mellitus with diabetic peripheral angiopathy without gangrene: Secondary | ICD-10-CM | POA: Diagnosis not present

## 2020-11-02 DIAGNOSIS — I2510 Atherosclerotic heart disease of native coronary artery without angina pectoris: Secondary | ICD-10-CM | POA: Insufficient documentation

## 2020-11-02 DIAGNOSIS — I482 Chronic atrial fibrillation, unspecified: Secondary | ICD-10-CM | POA: Diagnosis not present

## 2020-11-02 DIAGNOSIS — I5042 Chronic combined systolic (congestive) and diastolic (congestive) heart failure: Secondary | ICD-10-CM | POA: Diagnosis not present

## 2020-11-02 DIAGNOSIS — I110 Hypertensive heart disease with heart failure: Secondary | ICD-10-CM | POA: Diagnosis not present

## 2020-11-02 DIAGNOSIS — Z7901 Long term (current) use of anticoagulants: Secondary | ICD-10-CM | POA: Diagnosis not present

## 2020-11-02 DIAGNOSIS — Z881 Allergy status to other antibiotic agents status: Secondary | ICD-10-CM | POA: Diagnosis not present

## 2020-11-02 DIAGNOSIS — Z6838 Body mass index (BMI) 38.0-38.9, adult: Secondary | ICD-10-CM | POA: Diagnosis not present

## 2020-11-02 DIAGNOSIS — E785 Hyperlipidemia, unspecified: Secondary | ICD-10-CM | POA: Diagnosis not present

## 2020-11-02 DIAGNOSIS — Z8673 Personal history of transient ischemic attack (TIA), and cerebral infarction without residual deficits: Secondary | ICD-10-CM | POA: Diagnosis not present

## 2020-11-02 DIAGNOSIS — G4733 Obstructive sleep apnea (adult) (pediatric): Secondary | ICD-10-CM | POA: Insufficient documentation

## 2020-11-02 DIAGNOSIS — Z955 Presence of coronary angioplasty implant and graft: Secondary | ICD-10-CM | POA: Insufficient documentation

## 2020-11-02 DIAGNOSIS — E119 Type 2 diabetes mellitus without complications: Secondary | ICD-10-CM

## 2020-11-02 DIAGNOSIS — I252 Old myocardial infarction: Secondary | ICD-10-CM | POA: Diagnosis not present

## 2020-11-02 DIAGNOSIS — Z8249 Family history of ischemic heart disease and other diseases of the circulatory system: Secondary | ICD-10-CM | POA: Diagnosis not present

## 2020-11-02 DIAGNOSIS — Z87891 Personal history of nicotine dependence: Secondary | ICD-10-CM | POA: Insufficient documentation

## 2020-11-02 DIAGNOSIS — Z7984 Long term (current) use of oral hypoglycemic drugs: Secondary | ICD-10-CM | POA: Insufficient documentation

## 2020-11-02 DIAGNOSIS — Z7902 Long term (current) use of antithrombotics/antiplatelets: Secondary | ICD-10-CM | POA: Diagnosis not present

## 2020-11-02 DIAGNOSIS — I10 Essential (primary) hypertension: Secondary | ICD-10-CM

## 2020-11-02 DIAGNOSIS — Z79899 Other long term (current) drug therapy: Secondary | ICD-10-CM | POA: Diagnosis not present

## 2020-11-02 NOTE — Progress Notes (Signed)
Advanced Heart Failure Clinic Note   Referring Physician: [name] PCP: Marin Olp, MD PCP-Cardiologist: Sherren Mocha, MD   HPI:  [name] is a 75 y.o. male with h/o CAD s/p DES to LAD 2004, STEMI 2008 2/2 very late stent thombosis, Chronic Afib, Chronic combined CHF, OSA on CPAP, morbid obesity, HTN, HLD, and Type 2 DM. Referred by Ermalinda Barrios PA for assistance with management of HF.  Saw [name] 04/09/18 with worsening dyspnea. Spiro increased. BNP elevated. Echo repeated which showed worsening LVEF as below. Lasix increased as well. Saw [name] on 05/09/18 and [facility]B switched to [facility],   We saw him for the first time in the HF Clinic in 8/19.   Now back in CR (has been going for 7 years).   He presented 10/19 for regular follow up. Feeling OK overall. No orthopnea, but sleeps on a wedge  Uses CPAP every night. Takes extra lasix on occasion, but hasn't in sometime. BP usually runs 110-120s in CR. Echo this visit 25-30% and referred to EP for ICD evaluation. He saw Dr. Rayann Heman and was adamant about not wanting to pursue ICD implantation.  Today he returns for HF follow up. Has not been seen in clinic since 10/19. Overall feeling fine. Does yardwork, grocery shopping, all ADLS. Denies increasing SOB, CP, dizziness, edema, or PND/Orthopnea. Appetite ok. No fever or chills. Weight at home 240-246 pounds. sBPs at home~120-130's. Taking all medications. Has not needed extra lasix.   LHC 2017 - Patent stent in LAD, severe ostial stenosis of LCx not amenable to PCI, patent, non-dominant RCA supplying collaterals to the diagonal branch to the LAD.   Echo 09/29/2016 LVEF 40-45%, trivial MR, Mod LAE,  Echo 04/25/2018 LVEF 25-30%, Grade 3 DD, Mild MR, Severe LAE, Mod RV dilation, PA peak pressure 48 mm Hg. WMA noted with Akinesis of the mid-apical anterior, mid anteroseptal, apical lateral, and apical myocardium; hypokinesis of the mid myocardium; moderate hypokinesis of the  basal anteroseptal, basal-mid inferoseptal, mid-apical inferior, and apical septal myocardium.  Cath 06/01/18  With stable CAD.  RA 9 RV 35/10 PA 44/14 (25) Ao 199/76 (93) LV 124/16 Fick 4.7/2.0    Echo 10/19: EF ~ 25-30%  ROS: All systems reviewed and negative except as per HPI.   Past Medical History:  Diagnosis Date  . ARF (acute renal failure) ([place]) 04/15/2019  . Atherosclerotic heart disease of native coronary artery with other forms of angina pectoris ([place]) 04/26/2019  . Atrial fibrillation ([place])    initial diagnoses 2012  . B12 deficiency    per patient previously taking shots  . C. difficile colitis 04/17/2019  . C. difficile diarrhea   . CAD (coronary artery disease), native coronary artery 04/12/2007   History of MI. 2 stents. Plavix. Imdur 74m 10/28/15 cath Dr. CBurt Knack Findings: severe ostial LCx stenosis, patent LAD stent, patent dominant RCA supplies diagonal collateral.    . Chronic combined systolic and diastolic CHF (congestive heart failure) ([place] 04/15/2019  . CORONARY ARTERY DISEASE 04/12/2007   2 stents last in 2006.   .Marland KitchenCystoid macular edema of right eye 01/16/2020  . Cystoid macular edema of right eye 01/16/2020  . DIABETES MELLITUS, TYPE II 04/16/2007  . Diverticulosis of colon (without mention of hemorrhage)   . GERD (gastroesophageal reflux disease) 08/09/2011  . HYPERLIPIDEMIA 04/16/2007   reveal study. not sure of medication  . Hyperlipidemia 04/16/2007   10/24 reveal study end. Atorvastatin 218m . HYPERTENSION 04/12/2007  . HYPOTHYROIDISM 04/12/2007  .  Hypothyroidism 04/12/2007   Synthroid 56mg   . Ischemic cardiomyopathy    cath 35-40%  . Morbid obesity ([facility] 08/09/2011  . MYOCARDIAL INFARCTION, HX OF 04/12/2007  . OBESITY 06/16/2009  . Obstructive sleep apnea 04/12/2007   02/2011 - AHI 96/h CPAP 13, Lg FF     . SLEEP APNEA 04/12/2007   CPAP  . Stroke ([place]   . Type II diabetes mellitus with peripheral circulatory disorder ([place] 04/16/2007   Dr. GCruzita Lederermanages. Uses  fructosamine.    .Marland KitchenULCERATIVE COLITIS, LEFT SIDED 11/23/2010  . Ulcerative pancolitis without complication (HSpringer 165/46/5035  Ulcerative Colitis. Mesalamine.   .Marland KitchenVITAMIN B12 DEFICIENCY 11/29/2010    Current Outpatient Medications  Medication Sig Dispense Refill  . APRISO 0.375 g 24 hr capsule Take 4 capsules (1.5 g total) by mouth daily. 120 capsule 5  . atorvastatin (LIPITOR) 20 MG tablet TAKE ONE TABLET (20MG TOTAL) BY MOUTH DAILY 90 tablet 3  . carvedilol (COREG) 25 MG tablet TAKE ONE TABLET BY MOUTH TWICE A DAY 180 tablet 3  . clopidogrel (PLAVIX) 75 MG tablet TAKE ONE TABLET (75MG TOTAL) BY MOUTH DAILY 90 tablet 3  . Coenzyme Q10 (COQ10 PO) Take 1 capsule by mouth daily with supper.    . diclofenac Sodium (VOLTAREN) 1 % GEL Apply 1 application topically as needed (knee pain).     .Marland Kitchendicyclomine (BENTYL) 10 MG capsule TAKE ONE CAPSULE (10MG TOTAL) BY MOUTH TWO TIMES DAILY AS NEEDED FOR DIARRHEA OR ABDOMINAL CRAMPING 90 capsule 3  . empagliflozin (JARDIANCE) 10 MG TABS tablet TAKE ONE (1) TABLET BY MOUTH EVERY DAY 90 tablet 3  . fish oil-omega-3 fatty acids 1000 MG capsule Take 1 g by mouth daily with supper.    . furosemide (LASIX) 40 MG tablet TAKE ONE TABLET BY MOUTH EVERY DAY. 180 tablet 1  . glipiZIDE (GLUCOTROL) 10 MG tablet Take 1 tablet (10 mg total) by mouth 2 (two) times daily before a meal. 180 tablet 3  . glucosamine-chondroitin 500-400 MG tablet Take 1 tablet by mouth daily as needed (knee flare up or pain).    .Marland Kitchenglucose blood test strip Use as instructed to test 4 time daily 400 each 3  . isosorbide mononitrate (IMDUR) 60 MG 24 hr tablet TAKE ONE (1) TABLET BY MOUTH EVERY DAY 90 tablet 1  . PRADAXA 150 MG CAPS capsule TAKE ONE (1) CAPSULE BY MOUTH TWICE A DAY. (EVERY 12 HOURS.) 60 capsule 5  . saccharomyces boulardii (FLORASTOR) 250 MG capsule Take 1 capsule (250 mg total) by mouth 2 (two) times daily.    . sacubitril-valsartan (ENTRESTO) 97-103 MG Take 1 tablet by mouth 2  (two) times daily. MUST HAVE FOLLOW UP APPOINTMENT FOR FURTHER REFILLS 60 tablet 2  . sitaGLIPtin (JANUVIA) 100 MG tablet TAKE ONE (1) TABLET BY MOUTH EVERY DAY 90 tablet 0  . spironolactone (ALDACTONE) 25 MG tablet TAKE ONE (1) TABLET BY MOUTH EVERY DAY 90 tablet 3  . SYNTHROID 50 MCG tablet TAKE ONE TABLET BY MOUTH DAILY BEFORE BREAKFAST 90 tablet 3   No current facility-administered medications for this encounter.    Allergies  Allergen Reactions  . Clindamycin/Lincomycin     Developed C. 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 Former smoker (quit 1967). No drug use. Lives with wife.    Social History   Socioeconomic History  . Marital status: Married    Spouse name: Not on file  . Number  of children: 1  . Years of education: Not on file  . Highest education level: Not on file  Occupational History  . Occupation: RETIRED    Employer: RETIRED  Tobacco Use  . Smoking status: Former Smoker    Packs/day: 1.00    Years: 5.00    Pack years: 5.00    Types: Cigarettes    Quit date: 04/13/1966    Years since quitting: 54.5  . Smokeless tobacco: Never Used  Vaping Use  . Vaping Use: Never used  Substance and Sexual Activity  . Alcohol use: No    Alcohol/week: 0.0 standard drinks    Comment: no more beer  . Drug use: No  . Sexual activity: Not on file  Other Topics Concern  . Not on file  Social History Narrative   Cardiorehab 3 days a week. 45 minutes to an hour-stationary bike.    GRANDDAUGHTER (MS. 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       Married for 37 years in 2015, married previously for 14 years. Daughter with first wife and 3 grandkids.    Lives alone with wife. Get to see grandkids a lot. New grandchild in middle of 10      Hobbies-yardwork, previously liked to hunt and fish, does some target shooting   Social  Determinants of Health   Financial Resource Strain: Not on file  Food Insecurity: Not on file  Transportation Needs: Not on file  Physical Activity: Not on file  Stress: Not on file  Social Connections: Not on file  Intimate Partner Violence: Not on file     Family History  Problem Relation Age of Onset  . Heart attack Mother        mid 79s  . Heart disease Father        H/O CAD, CABG, VALVE SURGERY  . Hypertension Brother   . Obesity Brother   . Stomach cancer Maternal Aunt   . Stomach cancer Paternal Grandmother        ? colon   . Colon cancer Neg Hx     Vitals:   11/02/20 1520  BP: (!) 155/99  Pulse: 80  SpO2: 98%  Weight: 110.1 kg    Wt Readings from Last 3 Encounters:  11/02/20 110.1 kg  09/02/20 110 kg  07/08/20 111.6 kg     PHYSICAL EXAM: General: Well appearing. No resp difficulty. HEENT: Normal. Anicteric  Neck: Supple. Thick neck, JVD difficult to discern. Carotids 2+ bilat; no bruits. No thyromegaly or nodule noted. Cor: PMI nondisplaced. Irregularly irregular, No M/G/R noted. Lungs: CTAB, normal effort. Abdomen: Obese, soft, non-tender, non-distended, no HSM. No bruits or masses. +BS  Extremities: No cyanosis, clubbing, or rash. Trace ankle edema.  Neuro: Alert & oriented x 3, cranial nerves grossly intact. moves all 4 extremities w/o difficulty. Affect pleasant   ECG: atrial fibrillation 84 bpm w/ LVH (personally reviewed).  ASSESSMENT & PLAN:  1. Chronic combined CHF, ICM - Echo 04/25/2018 LVEF 25-30%, Grade 3 DD, Mild MR, Severe LAE, Mod RV dilation, PA peak pressure 48 mm Hg. WMA noted - Echo 12/19 with persistently depressed EF at 25-30%. Declined ICD after EP evaluation with Dr. Rayann Heman.  - NYHA I-II symptoms, euvolemic on exam. Weights stable. - Continue Entresto 97/103 mg bid. - Continue lasix 40 mg daily. - Continue coreg 25 mg bid. - Continue  spironolactone 25 mg daily. - Continue Jardiance 10 mg daily.  - Repeat Echo, if EF still  <35%, will re-visit EP referral for ICD consideration.  - BMET Today.   2. CAD - LHC 2017 with circumflex stenosis unsuitable for PCI, continued patency of the LAD stent and total occlusion of the first diagonal with collaterals from RCA, widely patent RCA. - Cath 8/19 with stable anatomy.  - No s/s of ischemia.    - Continue statin and plavix. Not on ASA with Pradaxa.   3. Chronic Atrial Fibrillation - Chronic, rate controlled. - On Pradaxa for AC. Denies bleeding.  - This patients CHA2DS2-VASc is 7.  4. Essential HTN  - Elevated today, but says SBP at home ~ 1230-130s - Meds as above.   5. OSA on CPAP - Continue nightly use.   6. Morbid Obesity - Body mass index is 38.03 kg/m.  - Encouraged weight loss.   7. DM2 - Per PCP.  - A1c 6.3 (9/21) - Continue Jardiance.   Greater than 50% of the (total minutes 20) visit spent in counseling/coordination of care regarding HF medical management, weight control, dietary choices, and rationale for considering ICD when EF<35%.   Follow up in 1-2 months with Dr. Haroldine Laws w/ echo.  Rafael Bihari, FNP  3:24 PM

## 2020-11-02 NOTE — Patient Instructions (Addendum)
Labs done today, your results will be available in MyChart, we will contact you for abnormal readings.  Your physician recommends that you schedule a follow-up appointment in: 2 months with an echocardiogram  Your physician has requested that you have an echocardiogram. Echocardiography is a painless test that uses sound waves to create images of your heart. It provides your doctor with information about the size and shape of your heart and how well your heart's chambers and valves are working. This procedure takes approximately one hour. There are no restrictions for this procedure.  If you have any questions or concerns before your next appointment please send [place] a message through [place] or call our office at [phone].    TO LEAVE A MESSAGE FOR THE NURSE SELECT OPTION 2, PLEASE LEAVE A MESSAGE INCLUDING: . YOUR NAME . DATE OF BIRTH . CALL BACK NUMBER . REASON FOR CALL**this is important as we prioritize the call backs  YOU WILL RECEIVE A CALL BACK THE SAME DAY AS LONG AS YOU CALL BEFORE 4:00 PM

## 2020-11-06 ENCOUNTER — Other Ambulatory Visit: Payer: Self-pay

## 2020-11-06 ENCOUNTER — Ambulatory Visit (INDEPENDENT_AMBULATORY_CARE_PROVIDER_SITE_OTHER): Payer: Medicare Other

## 2020-11-06 VITALS — BP 140/80 | HR 79 | Temp 98.2°F | Resp 24 | Wt 246.2 lb

## 2020-11-06 DIAGNOSIS — Z0000 Encounter for general adult medical examination without abnormal findings: Secondary | ICD-10-CM

## 2020-11-06 NOTE — Progress Notes (Signed)
Subjective:   [name] is a 75 y.o. male who presents for Medicare Annual/Subsequent preventive examination.  Review of Systems      Cardiac Risk Factors include: advanced age (>59mn, >>37women);diabetes mellitus;hypertension;dyslipidemia;male gender;obesity (BMI >30kg/m2)     Objective:    Today's Vitals   11/06/20 1519  BP: 140/80  Pulse: 79  Resp: (!) 24  Temp: 98.2 F (36.8 C)  SpO2: 96%  Weight: 246 lb 3.2 oz (111.7 kg)   Body mass index is 38.56 kg/m.  Advanced Directives 11/06/2020 08/26/2019 04/15/2019 06/01/2018 01/25/2018 10/25/2015  Does Patient Have a Medical Advance Directive? Yes Yes No Yes No No  Type of AParamedicof [place]thing[place] will Living will;Healthcare Power of [place]ad[place]Living will - -  Does patient want to make changes to medical advance directive? - No - Patient declined - No - Patient declined - -  Copy of HMahopacin Chart? No - copy requested No - copy requested - No - copy requested - -  Would patient like information on creating a medical advance directive? - - No - Patient declined - - No - patient declined information    Current Medications (verified) Outpatient Encounter Medications as of 11/06/2020  Medication Sig  . APRISO 0.375 g 24 hr capsule Take 4 capsules (1.5 g total) by mouth daily.  .Marland Kitchenatorvastatin (LIPITOR) 20 MG tablet TAKE ONE TABLET (20MG TOTAL) BY MOUTH DAILY  . carvedilol (COREG) 25 MG tablet TAKE ONE TABLET BY MOUTH TWICE A DAY  . clopidogrel (PLAVIX) 75 MG tablet TAKE ONE TABLET (75MG TOTAL) BY MOUTH DAILY  . Coenzyme Q10 (COQ10 PO) Take 1 capsule by mouth daily with supper.  . diclofenac Sodium (VOLTAREN) 1 % GEL Apply 1 application topically as needed (knee pain).   .Marland Kitchendicyclomine (BENTYL) 10 MG capsule TAKE ONE CAPSULE (10MG TOTAL) BY MOUTH TWO TIMES DAILY AS NEEDED FOR DIARRHEA OR ABDOMINAL CRAMPING  . empagliflozin (JARDIANCE) 10 MG TABS  tablet TAKE ONE (1) TABLET BY MOUTH EVERY DAY  . fish oil-omega-3 fatty acids 1000 MG capsule Take 1 g by mouth daily with supper.  . furosemide (LASIX) 40 MG tablet TAKE ONE TABLET BY MOUTH EVERY DAY.  .Marland KitchenglipiZIDE (GLUCOTROL) 10 MG tablet Take 1 tablet (10 mg total) by mouth 2 (two) times daily before a meal.  . glucosamine-chondroitin 500-400 MG tablet Take 1 tablet by mouth daily as needed (knee flare up or pain).  .Marland Kitchenglucose blood test strip Use as instructed to test 4 time daily  . isosorbide mononitrate (IMDUR) 60 MG 24 hr tablet TAKE ONE (1) TABLET BY MOUTH EVERY DAY  . PRADAXA 150 MG CAPS capsule TAKE ONE (1) CAPSULE BY MOUTH TWICE A DAY. (EVERY 12 HOURS.)  . saccharomyces boulardii (FLORASTOR) 250 MG capsule Take 1 capsule (250 mg total) by mouth 2 (two) times daily.  . sacubitril-valsartan (ENTRESTO) 97-103 MG Take 1 tablet by mouth 2 (two) times daily. MUST HAVE FOLLOW UP APPOINTMENT FOR FURTHER REFILLS  . sitaGLIPtin (JANUVIA) 100 MG tablet TAKE ONE (1) TABLET BY MOUTH EVERY DAY  . spironolactone (ALDACTONE) 25 MG tablet TAKE ONE (1) TABLET BY MOUTH EVERY DAY  . SYNTHROID 50 MCG tablet TAKE ONE TABLET BY MOUTH DAILY BEFORE BREAKFAST   No facility-administered encounter medications on file as of 11/06/2020.    Allergies (verified) Clindamycin/lincomycin   History: Past Medical History:  Diagnosis Date  . AR[place]acute renal failure) ([place] 04/15[place]  .  Atherosclerotic heart disease of native coronary artery with other forms of angina pectoris ([place]) 04/26/2019  . Atrial fibrillation ([place])    initial diagnoses 2012  . B12 deficiency    per patient previously taking shots  . C. difficile colitis 04/17/2019  . C. difficile diarrhea   . CAD (coronary artery disease), native coronary artery 04/12/2007   History of MI. 2 stents. Plavix. Imdur 14m 10/28/15 cath Dr. CBurt Knack Findings: severe ostial LCx stenosis, patent LAD stent, patent dominant RCA supplies diagonal collateral.    . Chronic  combined systolic and diastolic CHF (congestive heart failure) ([place] 04/15/2019  . CORONARY ARTERY DISEASE 04/12/2007   2 stents last in 2006.   .Marland KitchenCystoid macular edema of right eye 01/16/2020  . Cystoid macular edema of right eye 01/16/2020  . DIABETES MELLITUS, TYPE II 04/16/2007  . Diverticulosis of colon (without mention of hemorrhage)   . GERD (gastroesophageal reflux disease) 08/09/2011  . HYPERLIPIDEMIA 04/16/2007   reveal study. not sure of medication  . Hyperlipidemia 04/16/2007   10/24 reveal study end. Atorvastatin 252m . HYPERTENSION 04/12/2007  . HYPOTHYROIDISM 04/12/2007  . Hypothyroidism 04/12/2007   Synthroid 5053m  . Ischemic cardiomyopathy    cath 35-40%  . Morbid obesity ([place]/30/2012  . MYOCARDIAL INFARCTION, HX OF 04/12/2007  . OBESITY 06/16/2009  . Obstructive sleep apnea 04/12/2007   02/2011 - AHI 96/h CPAP 13, Lg FF     . SLEEP APNEA 04/12/2007   CPAP  . Stroke ([place] . Type II diabetes mellitus with peripheral circulatory disorder ([place]CC[place]/04/2007   Dr. GheCruzita Lederernages. Uses fructosamine.    . UMarland KitchenCERATIVE COLITIS, LEFT SIDED 11/23/2010  . Ulcerative pancolitis without complication ([place]CC[place]0/10/27/2536Ulcerative Colitis. Mesalamine.   . VMarland KitchenTAMIN B12 DEFICIENCY 11/29/2010   Past Surgical History:  Procedure Laterality Date  . CARDIAC CATHETERIZATION  10/2002   STENT. 2 stents Dr. BroMaurene Capes CARDIAC CATHETERIZATION N/A 10/28/2015   Procedure: Right/Left Heart Cath and Coronary Angiography;  Surgeon: MicSherren MochaD;  Location: [facility];  Service: Cardiovascular;  Laterality: N/A;  . CATARACT EXTRACTION     2021 bilateral eyes   . CORONARY STENT PLACEMENT  2008   LAD   . RIGHT/LEFT HEART CATH AND CORONARY ANGIOGRAPHY N/A 06/01/2018   Procedure: RIGHT/LEFT HEART CATH AND CORONARY ANGIOGRAPHY;  Surgeon: CooSherren MochaD;  Location: [facility];  Service: Cardiovascular;  Laterality: N/A;  . TONSILLECTOMY     Family History  Problem Relation Age of Onset  .  Heart attack Mother        mid 70s80s Heart disease Father        H/O CAD, CABG, VALVE SURGERY  . Hypertension Brother   . Obesity Brother   . Stomach cancer Maternal Aunt   . Stomach cancer Paternal Grandmother        ? colon   . Colon cancer Neg Hx    Social History   Socioeconomic History  . Marital status: Married    Spouse name: Not on file  . Number of children: 1  . Years of education: Not on file  . Highest education level: Not on file  Occupational History  . Occupation: RETIRED    Employer: RETIRED  Tobacco Use  . Smoking status: Former Smoker    Packs/day: 1.00    Years: 5.00    Pack years: 5.00    Types: Cigarettes    Quit date: 04/13/1966  Years since quitting: 54.6  . Smokeless tobacco: Never Used  Vaping Use  . Vaping Use: Never used  Substance and Sexual Activity  . Alcohol use: No    Alcohol/week: 0.0 standard drinks    Comment: no more beer  . Drug use: No  . Sexual activity: Not on file  Other Topics Concern  . Not on file  Social History Narrative   Cardiorehab 3 days a week. 45 minutes to an hour-stationary bike.    GRANDDAUGHTER (MS. 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       Married for 37 years in 2015, married previously for 14 years. Daughter with first wife and 3 grandkids.    Lives alone with wife. Get to see grandkids a lot. New grandchild in middle of 67      Hobbies-yardwork, previously liked to hunt and fish, does some target shooting   Social Determinants of Health   Financial Resource Strain: Low Risk   . Difficulty of Paying Living Expenses: Not hard at all  Food Insecurity: No Food Insecurity  . Worried About Charity fundraiser in the Last Year: Never true  . Ran Out of Food in the Last Year: Never true  Transportation Needs: No Transportation Needs  . Lack of Transportation (Medical): No  . Lack of Transportation  (Non-Medical): No  Physical Activity: Inactive  . Days of Exercise per Week: 0 days  . Minutes of Exercise per Session: 0 min  Stress: No Stress Concern Present  . Feeling of Stress : Not at all  Social Connections: Moderately Integrated  . Frequency of Communication with Friends and Family: More than three times a week  . Frequency of Social Gatherings with Friends and Family: More than three times a week  . Attends Religious Services: More than 4 times per year  . Active Member of Clubs or Organizations: No  . Attends Archivist Meetings: Never  . Marital Status: Married    Tobacco Counseling Counseling given: Not Answered   Clinical Intake:  Pre-visit preparation completed: Yes  Pain : No/denies pain     BMI - recorded: 38.56 Nutritional Status: BMI > 30  Obese Nutritional Risks: None Diabetes: Yes CBG done?: Yes (128) CBG resulted in Enter/ Edit results?: No Did pt. bring in CBG monitor from home?: No  How often do you need to have someone help you when you read instructions, pamphlets, or other written materials from your doctor or pharmacy?: 1 - Never  Diabetic?Nutrition Risk Assessment:   Has the patient had any N/V/D within the last 2 months?  No  Does the patient have any non-healing wounds?  No  Has the patient had any unintentional weight loss or weight gain?  No   Diabetes:  Is the patient diabetic?  Yes  If diabetic, was a CBG obtained today?  Yes  Did the patient bring in their glucometer from home?  No  How often do you monitor your CBG's? Daily.   Financial Strains and Diabetes Management:  Are you having any financial strains with the device, your supplies or your medication? No .  Does the patient want to be seen by Chronic Care Management for management of their diabetes?  No  Would the patient like to be referred to a Nutritionist or for Diabetic Management?  No   Diabetic Exams:  Diabetic  Eye Exam: Completed 11/11/19 Diabetic  Foot Exam: Overdue, Pt has been advised about the importance in completing this exam. Pt is scheduled for diabetic foot exam on 03/03/21 on next appt .   Interpreter Needed?: No  Information entered by :: Charlott Rakes, LPN   Activities of Daily Living In your present state of health, do you have any difficulty performing the following activities: 11/06/2020 09/02/2020  Hearing? Y Y  Comment hearing loss -  Vision? N N  Difficulty concentrating or making decisions? Y N  Comment memory at times more names -  Walking or climbing stairs? N N  Dressing or bathing? N N  Doing errands, shopping? N N  Preparing Food and eating ? N -  Using the Toilet? N -  In the past six months, have you accidently leaked urine? N -  Do you have problems with loss of bowel control? N -  Managing your Medications? N -  Managing your Finances? N -  Housekeeping or managing your Housekeeping? N -  Some recent data might be hidden    Patient Care Team: Marin Olp, MD as PCP - General (Family Medicine) Sherren Mocha, MD as PCP - Cardiology (Cardiology) Rutherford Guys, MD as Consulting Physician (Ophthalmology) Philemon Kingdom, MD as Consulting Physician (Endocrinology) Ladene Artist, MD as Consulting Physician (Gastroenterology) Gala Romney Cristopher Estimable, MD as Consulting Physician (Gastroenterology) Gala Romney Cristopher Estimable, MD as Consulting Physician (Gastroenterology)  Indicate any recent Medical Services you may have received from other than [facility] providers in the past year (date may be approximate).     Assessment:   This is a routine wellness examination for [facility].  Hearing/Vision screen  Hearing Screening   125Hz  250Hz  500Hz  1000Hz  2000Hz  3000Hz  4000Hz  6000Hz  8000Hz   Right ear:           Left ear:           Comments: Pt states hearing loss  Vision Screening Comments: Pt follows [facility] with [facility] for exams annually   Dietary issues and exercise activities  discussed: Current Exercise Habits: The patient does not participate in regular exercise at present  Goals    . Patient Stated     None at this time    . Weight (lb) < 250 lb (113.4 kg)     Continue to watch when and how much you eat. Your exercise routine is good Stay active through the week       Depression Screen PHQ 2/9 Scores 11/06/2020 09/02/2020 08/26/2019 08/26/2019 02/28/2019 05/28/2018 01/25/2018  PHQ - 2 Score 0 0 0 0 0 0 0    Fall Risk Fall Risk  11/06/2020 02/24/2020 08/26/2019 08/26/2019 02/28/2019  Falls in the past year? 0 0 0 0 0  Number falls in past yr: 0 0 0 - 0  Injury with Fall? 0 0 0 0 0  Risk for fall due to : Impaired vision History of fall(s) - - -  Follow up Falls prevention discussed - - Falls evaluation completed;Education provided;Falls prevention discussed -    FALL RISK PREVENTION PERTAINING TO THE HOME:  Any stairs in or around the home? Yes  If so, are there any without handrails? No  Home free of loose throw rugs in walkways, pet beds, electrical cords, etc? Yes  Adequate lighting in your home to reduce risk of falls? Yes   ASSISTIVE DEVICES UTILIZED TO PREVENT FALLS:  Life alert? No  Use of a cane, walker or w/c? No  Grab  bars in the bathroom? Yes  Shower chair or bench in shower? Yes  Elevated toilet seat or a handicapped toilet? Yes   TIMED UP AND GO:  Was the test performed? Yes .  Length of time to ambulate 10 feet: 10 sec.   Gait steady and fast without use of assistive device  Cognitive Function: MMSE - Mini Mental State Exam 01/25/2018  Not completed: (No Data)     6CIT Screen 11/06/2020 08/26/2019  What Year? 0 points 0 points  What month? 0 points 0 points  What time? - 0 points  Count back from 20 0 points 0 points  Months in reverse 0 points 0 points  Repeat phrase 2 points 0 points  Total Score - 0    Immunizations Immunization History  Administered Date(s) Administered  . Fluad Quad(high Dose 65+) 07/18/2019,  07/08/2020  . Influenza Split 07/06/2011, 06/25/2012  . Influenza Whole 07/14/2008, 08/03/2009, 08/24/2010  . Influenza, High Dose Seasonal PF 08/10/2016, 06/13/2017, 06/20/2018  . Influenza,inj,Quad PF,6+ Mos 06/03/2013, 07/11/2014, 06/29/2015  . Moderna Sars-Covid-2 Vaccination 01/02/2020, 02/04/2020, 09/08/2020  . Pneumococcal Conjugate-13 02/09/2015  . Pneumococcal Polysaccharide-23 07/06/2011  . Td 09/22/2010  . Zoster 07/19/2011    TDAP status: Due, Education has been provided regarding the importance of this vaccine. Advised may receive this vaccine at local pharmacy or Health Dept. Aware to provide a copy of the vaccination record if obtained from local pharmacy or Health Dept. Verbalized acceptance and understanding.  Flu Vaccine status: Up to date  Done 07/08/20 Pneumococcal vaccine status: Up to date  Covid-19 vaccine status: Completed vaccines  Qualifies for Shingles Vaccine? Yes   Zostavax completed Yes   Shingrix Completed?: No.    Education has been provided regarding the importance of this vaccine. Patient has been advised to call insurance company to determine out of pocket expense if they have not yet received this vaccine. Advised may also receive vaccine at local pharmacy or Health Dept. Verbalized acceptance and understanding.  Screening Tests Health Maintenance  Topic Date Due  . FOOT EXAM  04/25/2020  . TETANUS/TDAP  09/22/2020  . Hepatitis C Screening  10/14/2098 (Originally Nov 19, 1945)  . OPHTHALMOLOGY EXAM  11/10/2020  . COLONOSCOPY (Pts 45-45yr Insurance coverage will need to be confirmed)  12/17/2020  . HEMOGLOBIN A1C  01/05/2021  . INFLUENZA VACCINE  Completed  . COVID-19 Vaccine  Completed  . PNA vac Low Risk Adult  Completed    Health Maintenance  Health Maintenance Due  Topic Date Due  . FOOT EXAM  04/25/2020  . TETANUS/TDAP  09/22/2020    Colorectal cancer screening: Type of screening: Colonoscopy. Completed 12/18/10. Repeat every 10  years   Additional Screening:  Hepatitis C Screening: does qualify;   Vision Screening: Recommended annual ophthalmology exams for early detection of glaucoma and other disorders of the eye. Is the patient up to date with their annual eye exam?  Yes  Who is the provider or what is the name of the office in which the patient attends annual eye exams? [facility]   Dental Screening: Recommended annual dental exams for proper oral hygiene  Community Resource Referral / Chronic Care Management: CRR required this visit?  No   CCM required this visit?  No      Plan:     I have personally reviewed and noted the following in the patient's chart:   . Medical and social history . Use of alcohol, tobacco or illicit drugs  . Current medications  and supplements . Functional ability and status . Nutritional status . Physical activity . Advanced directives . List of other physicians . Hospitalizations, surgeries, and ER visits in previous 12 months . Vitals . Screenings to include cognitive, depression, and falls . Referrals and appointments  In addition, I have reviewed and discussed with patient certain preventive protocols, quality metrics, and best practice recommendations. A written personalized care plan for preventive services as well as general preventive health recommendations were provided to patient.     Willette Brace, LPN   [id number]   Nurse Notes: None

## 2020-11-06 NOTE — Patient Instructions (Addendum)
[name] , Thank you for taking time to come for your Medicare Wellness Visit. I appreciate your ongoing commitment to your health goals. Please review the following plan we discussed and let me know if I can assist you in the future.   Screening recommendations/referrals: Colonoscopy: Done 12/18/10 Recommended yearly ophthalmology/optometry visit for glaucoma screening and checkup Recommended yearly dental visit for hygiene and checkup  Vaccinations: Influenza vaccine: Done 07/08/20 Pneumococcal vaccine: Up to date Tdap vaccine: Due and discussed Shingles vaccine: Shingrix discussed. Please contact your pharmacy for coverage information.    Covid-19: Completed 3/25 & 02/04/20 & 09/08/20  Advanced directives: Please bring a copy of your health care power of attorney and living will to the office at your convenience.  Conditions/risks identified: None at this time  Next appointment: Follow up in one year for your annual wellness visit.   Preventive Care 34 Years and Older, Male Preventive care refers to lifestyle choices and visits with your health care provider that can promote health and wellness. What does preventive care include?  A yearly physical exam. This is also called an annual well check.  Dental exams once or twice a year.  Routine eye exams. Ask your health care provider how often you should have your eyes checked.  Personal lifestyle choices, including:  Daily care of your teeth and gums.  Regular physical activity.  Eating a healthy diet.  Avoiding tobacco and drug use.  Limiting alcohol use.  Practicing safe sex.  Taking low doses of aspirin every day.  Taking vitamin and mineral supplements as recommended by your health care provider. What happens during an annual well check? The services and screenings done by your health care provider during your annual well check will depend on your age, overall health, lifestyle risk factors, and family history of  disease. Counseling  Your health care provider may ask you questions about your:  Alcohol use.  Tobacco use.  Drug use.  Emotional well-being.  Home and relationship well-being.  Sexual activity.  Eating habits.  History of falls.  Memory and ability to understand (cognition).  Work and work Statistician. Screening  You may have the following tests or measurements:  Height, weight, and BMI.  Blood pressure.  Lipid and cholesterol levels. These may be checked every 5 years, or more frequently if you are over 61 years old.  Skin check.  Lung cancer screening. You may have this screening every year starting at age 62 if you have a 30-pack-year history of smoking and currently smoke or have quit within the past 15 years.  Fecal occult blood test (FOBT) of the stool. You may have this test every year starting at age 39.  Flexible sigmoidoscopy or colonoscopy. You may have a sigmoidoscopy every 5 years or a colonoscopy every 10 years starting at age 12.  Prostate cancer screening. Recommendations will vary depending on your family history and other risks.  Hepatitis C blood test.  Hepatitis B blood test.  Sexually transmitted disease (STD) testing.  Diabetes screening. This is done by checking your blood sugar (glucose) after you have not eaten for a while (fasting). You may have this done every 1-3 years.  Abdominal aortic aneurysm (AAA) screening. You may need this if you are a current or former smoker.  Osteoporosis. You may be screened starting at age 65 if you are at high risk. Talk with your health care provider about your test results, treatment options, and if necessary, the need for more tests. Vaccines  Your health care provider may recommend certain vaccines, such as:  Influenza vaccine. This is recommended every year.  Tetanus, diphtheria, and acellular pertussis (Tdap, Td) vaccine. You may need a Td booster every 10 years.  Zoster vaccine. You may  need this after age 54.  Pneumococcal 13-valent conjugate (PCV13) vaccine. One dose is recommended after age 45.  Pneumococcal polysaccharide (PPSV23) vaccine. One dose is recommended after age 101. Talk to your health care provider about which screenings and vaccines you need and how often you need them. This information is not intended to replace advice given to you by your health care provider. Make sure you discuss any questions you have with your health care provider. Document Released: 10/23/2015 Document Revised: 06/15/2016 Document Reviewed: 07/28/2015 Elsevier Interactive Patient Education  2017 [place] Prevention in the Home Falls can cause injuries. They can happen to people of all ages. There are many things you can do to make your home safe and to help prevent falls. What can I do on the outside of my home?  Regularly fix the edges of walkways and driveways and fix any cracks.  Remove anything that might make you trip as you walk through a door, such as a raised step or threshold.  Trim any bushes or trees on the path to your home.  Use bright outdoor lighting.  Clear any walking paths of anything that might make someone trip, such as rocks or tools.  Regularly check to see if handrails are loose or broken. Make sure that both sides of any steps have handrails.  Any raised decks and porches should have guardrails on the edges.  Have any leaves, snow, or ice cleared regularly.  Use sand or salt on walking paths during winter.  Clean up any spills in your garage right away. This includes oil or grease spills. What can I do in the bathroom?  Use night lights.  Install grab bars by the toilet and in the tub and shower. Do not use towel bars as grab bars.  Use non-skid mats or decals in the tub or shower.  If you need to sit down in the shower, use a plastic, non-slip stool.  Keep the floor dry. Clean up any water that spills on the floor as soon as it  happens.  Remove soap buildup in the tub or shower regularly.  Attach bath mats securely with double-sided non-slip rug tape.  Do not have throw rugs and other things on the floor that can make you trip. What can I do in the bedroom?  Use night lights.  Make sure that you have a light by your bed that is easy to reach.  Do not use any sheets or blankets that are too big for your bed. They should not hang down onto the floor.  Have a firm chair that has side arms. You can use this for support while you get dressed.  Do not have throw rugs and other things on the floor that can make you trip. What can I do in the kitchen?  Clean up any spills right away.  Avoid walking on wet floors.  Keep items that you use a lot in easy-to-reach places.  If you need to reach something above you, use a strong step stool that has a grab bar.  Keep electrical cords out of the way.  Do not use floor polish or wax that makes floors slippery. If you must use wax, use non-skid floor wax.  Do  not have throw rugs and other things on the floor that can make you trip. What can I do with my stairs?  Do not leave any items on the stairs.  Make sure that there are handrails on both sides of the stairs and use them. Fix handrails that are broken or loose. Make sure that handrails are as long as the stairways.  Check any carpeting to make sure that it is firmly attached to the stairs. Fix any carpet that is loose or worn.  Avoid having throw rugs at the top or bottom of the stairs. If you do have throw rugs, attach them to the floor with carpet tape.  Make sure that you have a light switch at the top of the stairs and the bottom of the stairs. If you do not have them, ask someone to add them for you. What else can I do to help prevent falls?  Wear shoes that:  Do not have high heels.  Have rubber bottoms.  Are comfortable and fit you well.  Are closed at the toe. Do not wear sandals.  If you  use a stepladder:  Make sure that it is fully opened. Do not climb a closed stepladder.  Make sure that both sides of the stepladder are locked into place.  Ask someone to hold it for you, if possible.  Clearly mark and make sure that you can see:  Any grab bars or handrails.  First and last steps.  Where the edge of each step is.  Use tools that help you move around (mobility aids) if they are needed. These include:  Canes.  Walkers.  Scooters.  Crutches.  Turn on the lights when you go into a dark area. Replace any light bulbs as soon as they burn out.  Set up your furniture so you have a clear path. Avoid moving your furniture around.  If any of your floors are uneven, fix them.  If there are any pets around you, be aware of where they are.  Review your medicines with your doctor. Some medicines can make you feel dizzy. This can increase your chance of falling. Ask your doctor what other things that you can do to help prevent falls. This information is not intended to replace advice given to you by your health care provider. Make sure you discuss any questions you have with your health care provider. Document Released: 07/23/2009 Document Revised: 03/03/2016 Document Reviewed: 10/31/2014 Elsevier Interactive Patient Education  2017 [employer].

## 2020-11-18 ENCOUNTER — Other Ambulatory Visit (HOSPITAL_COMMUNITY): Payer: Self-pay | Admitting: Internal Medicine

## 2020-11-23 ENCOUNTER — Encounter (HOSPITAL_COMMUNITY): Payer: Self-pay

## 2020-12-12 ENCOUNTER — Other Ambulatory Visit: Payer: Self-pay | Admitting: Internal Medicine

## 2020-12-17 ENCOUNTER — Other Ambulatory Visit: Payer: Self-pay | Admitting: Cardiovascular Disease

## 2020-12-25 ENCOUNTER — Telehealth: Payer: Self-pay | Admitting: Family Medicine

## 2020-12-25 NOTE — Progress Notes (Signed)
  Chronic Care Management   Outreach Note  12/25/2020 Name: [name] MRN: [id number] DOB: 1946/03/27  Referred by: Marin Olp, MD Reason for referral : No chief complaint on file.   Third unsuccessful telephone outreach was attempted today. The patient was referred to the pharmacist for assistance with care management and care coordination.   Follow Up Plan:   Lauretta Grill Upstream Scheduler

## 2020-12-25 NOTE — Telephone Encounter (Signed)
Pt returned your call. I tried transferring over but went to voicemail. Please return pt call to his cell phone number!   Thanks! Benjie Karvonen

## 2020-12-27 ENCOUNTER — Emergency Department (HOSPITAL_COMMUNITY): Payer: Medicare Other

## 2020-12-27 ENCOUNTER — Inpatient Hospital Stay (HOSPITAL_COMMUNITY)
Admission: EM | Admit: 2020-12-27 | Discharge: 2020-12-31 | DRG: 291 | Disposition: A | Discharge status: Home / Self Care | Payer: Medicare Other | Attending: Internal Medicine | Admitting: Internal Medicine

## 2020-12-27 ENCOUNTER — Inpatient Hospital Stay (HOSPITAL_COMMUNITY): Payer: Medicare Other

## 2020-12-27 DIAGNOSIS — Z20822 Contact with and (suspected) exposure to covid-19: Secondary | ICD-10-CM | POA: Diagnosis present

## 2020-12-27 DIAGNOSIS — N179 Acute kidney failure, unspecified: Secondary | ICD-10-CM | POA: Diagnosis present

## 2020-12-27 DIAGNOSIS — R4182 Altered mental status, unspecified: Secondary | ICD-10-CM | POA: Diagnosis not present

## 2020-12-27 DIAGNOSIS — I5043 Acute on chronic combined systolic (congestive) and diastolic (congestive) heart failure: Secondary | ICD-10-CM | POA: Diagnosis present

## 2020-12-27 DIAGNOSIS — G4733 Obstructive sleep apnea (adult) (pediatric): Secondary | ICD-10-CM | POA: Diagnosis present

## 2020-12-27 DIAGNOSIS — E1151 Type 2 diabetes mellitus with diabetic peripheral angiopathy without gangrene: Secondary | ICD-10-CM | POA: Diagnosis present

## 2020-12-27 DIAGNOSIS — Z743 Need for continuous supervision: Secondary | ICD-10-CM | POA: Diagnosis not present

## 2020-12-27 DIAGNOSIS — I499 Cardiac arrhythmia, unspecified: Secondary | ICD-10-CM | POA: Diagnosis not present

## 2020-12-27 DIAGNOSIS — I4821 Permanent atrial fibrillation: Secondary | ICD-10-CM | POA: Diagnosis not present

## 2020-12-27 DIAGNOSIS — Z9114 Patient's other noncompliance with medication regimen: Secondary | ICD-10-CM | POA: Diagnosis not present

## 2020-12-27 DIAGNOSIS — R464 Slowness and poor responsiveness: Secondary | ICD-10-CM | POA: Diagnosis not present

## 2020-12-27 DIAGNOSIS — Z79890 Hormone replacement therapy (postmenopausal): Secondary | ICD-10-CM | POA: Diagnosis not present

## 2020-12-27 DIAGNOSIS — E1122 Type 2 diabetes mellitus with diabetic chronic kidney disease: Secondary | ICD-10-CM | POA: Diagnosis present

## 2020-12-27 DIAGNOSIS — I4891 Unspecified atrial fibrillation: Secondary | ICD-10-CM | POA: Insufficient documentation

## 2020-12-27 DIAGNOSIS — I130 Hypertensive heart and chronic kidney disease with heart failure and stage 1 through stage 4 chronic kidney disease, or unspecified chronic kidney disease: Secondary | ICD-10-CM | POA: Diagnosis not present

## 2020-12-27 DIAGNOSIS — Z7984 Long term (current) use of oral hypoglycemic drugs: Secondary | ICD-10-CM

## 2020-12-27 DIAGNOSIS — Z9989 Dependence on other enabling machines and devices: Secondary | ICD-10-CM | POA: Diagnosis not present

## 2020-12-27 DIAGNOSIS — G9340 Encephalopathy, unspecified: Secondary | ICD-10-CM | POA: Insufficient documentation

## 2020-12-27 DIAGNOSIS — Z955 Presence of coronary angioplasty implant and graft: Secondary | ICD-10-CM | POA: Diagnosis not present

## 2020-12-27 DIAGNOSIS — Z4682 Encounter for fitting and adjustment of non-vascular catheter: Secondary | ICD-10-CM | POA: Diagnosis not present

## 2020-12-27 DIAGNOSIS — I471 Supraventricular tachycardia: Secondary | ICD-10-CM | POA: Diagnosis not present

## 2020-12-27 DIAGNOSIS — R404 Transient alteration of awareness: Secondary | ICD-10-CM | POA: Diagnosis not present

## 2020-12-27 DIAGNOSIS — J811 Chronic pulmonary edema: Secondary | ICD-10-CM | POA: Diagnosis not present

## 2020-12-27 DIAGNOSIS — Z8249 Family history of ischemic heart disease and other diseases of the circulatory system: Secondary | ICD-10-CM | POA: Diagnosis not present

## 2020-12-27 DIAGNOSIS — Z79899 Other long term (current) drug therapy: Secondary | ICD-10-CM

## 2020-12-27 DIAGNOSIS — R918 Other nonspecific abnormal finding of lung field: Secondary | ICD-10-CM | POA: Diagnosis not present

## 2020-12-27 DIAGNOSIS — E039 Hypothyroidism, unspecified: Secondary | ICD-10-CM | POA: Diagnosis present

## 2020-12-27 DIAGNOSIS — Z87891 Personal history of nicotine dependence: Secondary | ICD-10-CM | POA: Diagnosis not present

## 2020-12-27 DIAGNOSIS — D6959 Other secondary thrombocytopenia: Secondary | ICD-10-CM | POA: Diagnosis present

## 2020-12-27 DIAGNOSIS — E872 Acidosis: Secondary | ICD-10-CM | POA: Diagnosis present

## 2020-12-27 DIAGNOSIS — R0602 Shortness of breath: Secondary | ICD-10-CM | POA: Diagnosis not present

## 2020-12-27 DIAGNOSIS — Z8673 Personal history of transient ischemic attack (TIA), and cerebral infarction without residual deficits: Secondary | ICD-10-CM | POA: Diagnosis not present

## 2020-12-27 DIAGNOSIS — I517 Cardiomegaly: Secondary | ICD-10-CM | POA: Diagnosis not present

## 2020-12-27 DIAGNOSIS — Z4659 Encounter for fitting and adjustment of other gastrointestinal appliance and device: Secondary | ICD-10-CM

## 2020-12-27 DIAGNOSIS — R4189 Other symptoms and signs involving cognitive functions and awareness: Secondary | ICD-10-CM

## 2020-12-27 DIAGNOSIS — I472 Ventricular tachycardia: Secondary | ICD-10-CM | POA: Diagnosis present

## 2020-12-27 DIAGNOSIS — N1832 Chronic kidney disease, stage 3b: Secondary | ICD-10-CM | POA: Diagnosis present

## 2020-12-27 DIAGNOSIS — Z7902 Long term (current) use of antithrombotics/antiplatelets: Secondary | ICD-10-CM | POA: Diagnosis not present

## 2020-12-27 DIAGNOSIS — D751 Secondary polycythemia: Secondary | ICD-10-CM | POA: Diagnosis present

## 2020-12-27 DIAGNOSIS — J9621 Acute and chronic respiratory failure with hypoxia: Secondary | ICD-10-CM | POA: Diagnosis present

## 2020-12-27 DIAGNOSIS — G9341 Metabolic encephalopathy: Secondary | ICD-10-CM | POA: Diagnosis present

## 2020-12-27 DIAGNOSIS — I2510 Atherosclerotic heart disease of native coronary artery without angina pectoris: Secondary | ICD-10-CM | POA: Diagnosis present

## 2020-12-27 DIAGNOSIS — Z7901 Long term (current) use of anticoagulants: Secondary | ICD-10-CM

## 2020-12-27 DIAGNOSIS — I161 Hypertensive emergency: Secondary | ICD-10-CM | POA: Diagnosis present

## 2020-12-27 DIAGNOSIS — Z6834 Body mass index (BMI) 34.0-34.9, adult: Secondary | ICD-10-CM

## 2020-12-27 DIAGNOSIS — I252 Old myocardial infarction: Secondary | ICD-10-CM

## 2020-12-27 DIAGNOSIS — E785 Hyperlipidemia, unspecified: Secondary | ICD-10-CM | POA: Diagnosis present

## 2020-12-27 DIAGNOSIS — R6889 Other general symptoms and signs: Secondary | ICD-10-CM | POA: Diagnosis not present

## 2020-12-27 DIAGNOSIS — I255 Ischemic cardiomyopathy: Secondary | ICD-10-CM | POA: Diagnosis present

## 2020-12-27 DIAGNOSIS — Z7982 Long term (current) use of aspirin: Secondary | ICD-10-CM

## 2020-12-27 DIAGNOSIS — R000 Tachycardia, unspecified: Secondary | ICD-10-CM | POA: Diagnosis not present

## 2020-12-27 DIAGNOSIS — J9690 Respiratory failure, unspecified, unspecified whether with hypoxia or hypercapnia: Secondary | ICD-10-CM | POA: Diagnosis not present

## 2020-12-27 DIAGNOSIS — E1165 Type 2 diabetes mellitus with hyperglycemia: Secondary | ICD-10-CM | POA: Diagnosis present

## 2020-12-27 DIAGNOSIS — K219 Gastro-esophageal reflux disease without esophagitis: Secondary | ICD-10-CM | POA: Diagnosis present

## 2020-12-27 LAB — GLUCOSE, CAPILLARY
Glucose-Capillary: 175 mg/dL — ABNORMAL HIGH (ref 70–99)
Glucose-Capillary: 175 mg/dL — ABNORMAL HIGH (ref 70–99)

## 2020-12-27 LAB — I-STAT VENOUS BLOOD GAS, ED
Acid-base deficit: 8 mmol/L — ABNORMAL HIGH (ref 0.0–2.0)
Bicarbonate: 23.5 mmol/L (ref 20.0–28.0)
Calcium, Ion: 1.03 mmol/L — ABNORMAL LOW (ref 1.15–1.40)
HCT: 59 % — ABNORMAL HIGH (ref 39.0–52.0)
Hemoglobin: 20.1 g/dL — ABNORMAL HIGH (ref 13.0–17.0)
O2 Saturation: 46 %
Potassium: 3.9 mmol/L (ref 3.5–5.1)
Sodium: 138 mmol/L (ref 135–145)
TCO2: 26 mmol/L (ref 22–32)
pCO2, Ven: 68 mmHg — ABNORMAL HIGH (ref 44.0–60.0)
pH, Ven: 7.146 — CL (ref 7.250–7.430)
pO2, Ven: 33 mmHg (ref 32.0–45.0)

## 2020-12-27 LAB — I-STAT ARTERIAL BLOOD GAS, ED
Acid-base deficit: 6 mmol/L — ABNORMAL HIGH (ref 0.0–2.0)
Bicarbonate: 21.5 mmol/L (ref 20.0–28.0)
Calcium, Ion: 1.14 mmol/L — ABNORMAL LOW (ref 1.15–1.40)
HCT: 55 % — ABNORMAL HIGH (ref 39.0–52.0)
Hemoglobin: 18.7 g/dL — ABNORMAL HIGH (ref 13.0–17.0)
O2 Saturation: 98 %
Patient temperature: 97.6
Potassium: 4.3 mmol/L (ref 3.5–5.1)
Sodium: 137 mmol/L (ref 135–145)
TCO2: 23 mmol/L (ref 22–32)
pCO2 arterial: 45 mmHg (ref 32.0–48.0)
pH, Arterial: 7.284 — ABNORMAL LOW (ref 7.350–7.450)
pO2, Arterial: 107 mmHg (ref 83.0–108.0)

## 2020-12-27 LAB — COMPREHENSIVE METABOLIC PANEL
ALT: 16 U/L (ref 0–44)
AST: 34 U/L (ref 15–41)
Albumin: 4 g/dL (ref 3.5–5.0)
Alkaline Phosphatase: 110 U/L (ref 38–126)
Anion gap: 15 (ref 5–15)
BUN: 16 mg/dL (ref 8–23)
CO2: 19 mmol/L — ABNORMAL LOW (ref 22–32)
Calcium: 8.2 mg/dL — ABNORMAL LOW (ref 8.9–10.3)
Chloride: 100 mmol/L (ref 98–111)
Creatinine, Ser: 1.38 mg/dL — ABNORMAL HIGH (ref 0.61–1.24)
GFR, Estimated: 54 mL/min — ABNORMAL LOW (ref >60)
Glucose, Bld: 209 mg/dL — ABNORMAL HIGH (ref 70–99)
Potassium: 3.8 mmol/L (ref 3.5–5.1)
Sodium: 134 mmol/L — ABNORMAL LOW (ref 135–145)
Total Bilirubin: 1.4 mg/dL — ABNORMAL HIGH (ref 0.3–1.2)
Total Protein: 7.1 g/dL (ref 6.5–8.1)

## 2020-12-27 LAB — I-STAT CHEM 8, ED
BUN: 20 mg/dL (ref 8–23)
Calcium, Ion: 1 mmol/L — ABNORMAL LOW (ref 1.15–1.40)
Chloride: 103 mmol/L (ref 98–111)
Creatinine, Ser: 1.1 mg/dL (ref 0.61–1.24)
Glucose, Bld: 207 mg/dL — ABNORMAL HIGH (ref 70–99)
HCT: 58 % — ABNORMAL HIGH (ref 39.0–52.0)
Hemoglobin: 19.7 g/dL — ABNORMAL HIGH (ref 13.0–17.0)
Potassium: 4 mmol/L (ref 3.5–5.1)
Sodium: 138 mmol/L (ref 135–145)
TCO2: 24 mmol/L (ref 22–32)

## 2020-12-27 LAB — CBC
HCT: 58 % — ABNORMAL HIGH (ref 39.0–52.0)
Hemoglobin: 19 g/dL — ABNORMAL HIGH (ref 13.0–17.0)
MCH: 30.7 pg (ref 26.0–34.0)
MCHC: 32.8 g/dL (ref 30.0–36.0)
MCV: 93.9 fL (ref 80.0–100.0)
Platelets: 153 10*3/uL (ref 150–400)
RBC: 6.18 MIL/uL — ABNORMAL HIGH (ref 4.22–5.81)
RDW: 13.4 % (ref 11.5–15.5)
WBC: 14.9 10*3/uL — ABNORMAL HIGH (ref 4.0–10.5)
nRBC: 0 % (ref 0.0–0.2)

## 2020-12-27 LAB — RESP PANEL BY RT-PCR (FLU A&B, COVID) ARPGX2
Influenza A by PCR: NEGATIVE
Influenza B by PCR: NEGATIVE
SARS Coronavirus 2 by RT PCR: NEGATIVE

## 2020-12-27 LAB — BRAIN NATRIURETIC PEPTIDE: B Natriuretic Peptide: 880.4 pg/mL — ABNORMAL HIGH (ref 0.0–100.0)

## 2020-12-27 LAB — PROTIME-INR
INR: 1.3 — ABNORMAL HIGH (ref 0.8–1.2)
Prothrombin Time: 15.7 seconds — ABNORMAL HIGH (ref 11.4–15.2)

## 2020-12-27 LAB — TROPONIN I (HIGH SENSITIVITY)
Troponin I (High Sensitivity): 27 ng/L — ABNORMAL HIGH (ref <18)
Troponin I (High Sensitivity): 337 ng/L (ref <18)

## 2020-12-27 LAB — MRSA PCR SCREENING: MRSA by PCR: NEGATIVE

## 2020-12-27 LAB — LACTIC ACID, PLASMA
Lactic Acid, Venous: 5.3 mmol/L (ref 0.5–1.9)
Lactic Acid, Venous: 1.6 mmol/L (ref 0.5–1.9)

## 2020-12-27 LAB — HEMOGLOBIN A1C
Hgb A1c MFr Bld: 5.9 % — ABNORMAL HIGH (ref 4.8–5.6)
Mean Plasma Glucose: 122.63 mg/dL

## 2020-12-27 LAB — ETHANOL: Alcohol, Ethyl (B): <10 mg/dL (ref <10)

## 2020-12-27 LAB — APTT: aPTT: 34 seconds (ref 24–36)

## 2020-12-27 LAB — TSH: TSH: 2.036 u[IU]/mL (ref 0.350–4.500)

## 2020-12-27 MED ORDER — HEPARIN (PORCINE) 25000 UT/250ML-% IV SOLN
1800.0000 [IU]/h | INTRAVENOUS | Status: DC
  Administered 2020-12-27: 1500 [IU]/h via INTRAVENOUS
  Administered 2020-12-28: 1800 [IU]/h via INTRAVENOUS
  Filled 2020-12-27 (×3): qty 250

## 2020-12-27 MED ORDER — ASPIRIN 81 MG PO CHEW
81.0000 mg | CHEWABLE_TABLET | Freq: Every day | ORAL | Status: DC
  Administered 2020-12-27: 81 mg
  Filled 2020-12-27 (×2): qty 1

## 2020-12-27 MED ORDER — ONDANSETRON HCL 4 MG/2ML IJ SOLN: 4.0000 mg | Freq: Four times a day (QID) | INTRAMUSCULAR | Status: DC | PRN

## 2020-12-27 MED ORDER — FENTANYL 2500MCG IN NS 250ML (10MCG/ML) PREMIX INFUSION
25.0000 ug/h | INTRAVENOUS | Status: DC
  Administered 2020-12-27: 50 ug/h via INTRAVENOUS
  Filled 2020-12-27: qty 250

## 2020-12-27 MED ORDER — SACUBITRIL-VALSARTAN 97-103 MG PO TABS
1.0000 | ORAL_TABLET | Freq: Two times a day (BID) | ORAL | Status: DC
  Filled 2020-12-27: qty 1

## 2020-12-27 MED ORDER — SODIUM CHLORIDE 0.9 % IV SOLN
INTRAVENOUS | Status: DC | PRN
  Administered 2020-12-27: 250 mL via INTRAVENOUS

## 2020-12-27 MED ORDER — INSULIN ASPART 100 UNIT/ML ~~LOC~~ SOLN
0.0000 [IU] | SUBCUTANEOUS | Status: DC
  Administered 2020-12-27 – 2020-12-28 (×3): 3 [IU] via SUBCUTANEOUS
  Administered 2020-12-28: 2 [IU] via SUBCUTANEOUS

## 2020-12-27 MED ORDER — HEPARIN SODIUM (PORCINE) 5000 UNIT/ML IJ SOLN: 5000.0000 [IU] | Freq: Three times a day (TID) | INTRAMUSCULAR | Status: DC

## 2020-12-27 MED ORDER — DILTIAZEM LOAD VIA INFUSION
20.0000 mg | Freq: Once | INTRAVENOUS | Status: AC
  Administered 2020-12-27: 20 mg via INTRAVENOUS
  Filled 2020-12-27: qty 20

## 2020-12-27 MED ORDER — SODIUM CHLORIDE 0.9 % IV SOLN
1.0000 g | Freq: Once | INTRAVENOUS | Status: AC
  Administered 2020-12-27: 1 g via INTRAVENOUS
  Filled 2020-12-27: qty 10

## 2020-12-27 MED ORDER — POLYETHYLENE GLYCOL 3350 17 G PO PACK: 17.0000 g | PACK | Freq: Every day | ORAL | Status: DC | PRN

## 2020-12-27 MED ORDER — FENTANYL CITRATE (PF) 100 MCG/2ML IJ SOLN
50.0000 ug | Freq: Once | INTRAMUSCULAR | Status: AC
  Administered 2020-12-27: 50 ug via INTRAVENOUS

## 2020-12-27 MED ORDER — FENTANYL BOLUS VIA INFUSION
25.0000 ug | INTRAVENOUS | Status: DC | PRN
Start: 2020-12-27 — End: 2020-12-28
  Filled 2020-12-27: qty 25

## 2020-12-27 MED ORDER — ACETAMINOPHEN 325 MG PO TABS: 650.0000 mg | ORAL_TABLET | ORAL | Status: DC | PRN

## 2020-12-27 MED ORDER — FUROSEMIDE 10 MG/ML IJ SOLN
40.0000 mg | Freq: Once | INTRAMUSCULAR | Status: AC
  Administered 2020-12-27: 40 mg via INTRAVENOUS
  Filled 2020-12-27: qty 4

## 2020-12-27 MED ORDER — CALCIUM GLUCONATE-NACL 2-0.675 GM/100ML-% IV SOLN
2.0000 g | Freq: Once | INTRAVENOUS | Status: AC
  Administered 2020-12-27: 2000 mg via INTRAVENOUS
  Filled 2020-12-27: qty 100

## 2020-12-27 MED ORDER — ALBUTEROL SULFATE (2.5 MG/3ML) 0.083% IN NEBU: 2.5000 mg | INHALATION_SOLUTION | RESPIRATORY_TRACT | Status: DC | PRN

## 2020-12-27 MED ORDER — CHLORHEXIDINE GLUCONATE 0.12% ORAL RINSE (MEDLINE KIT)
15.0000 mL | Freq: Two times a day (BID) | OROMUCOSAL | Status: DC
  Administered 2020-12-27 – 2020-12-28 (×2): 15 mL via OROMUCOSAL

## 2020-12-27 MED ORDER — PROPOFOL 1000 MG/100ML IV EMUL
0.0000 ug/kg/min | INTRAVENOUS | Status: DC
  Administered 2020-12-27: 5 ug/kg/min via INTRAVENOUS

## 2020-12-27 MED ORDER — ATORVASTATIN CALCIUM 10 MG PO TABS
20.0000 mg | ORAL_TABLET | Freq: Every day | ORAL | Status: DC
  Filled 2020-12-27: qty 2

## 2020-12-27 MED ORDER — PANTOPRAZOLE SODIUM 40 MG IV SOLR
40.0000 mg | Freq: Every day | INTRAVENOUS | Status: DC
  Administered 2020-12-27: 40 mg via INTRAVENOUS
  Filled 2020-12-27: qty 40

## 2020-12-27 MED ORDER — PROPOFOL 1000 MG/100ML IV EMUL
INTRAVENOUS | Status: AC | PRN
  Administered 2020-12-27: 1675.5 ug via INTRAVENOUS

## 2020-12-27 MED ORDER — AMIODARONE HCL 150 MG/3ML IV SOLN
INTRAVENOUS | Status: AC | PRN
  Administered 2020-12-27: 150 mg via INTRAVENOUS

## 2020-12-27 MED ORDER — SODIUM CHLORIDE 0.9 % IV SOLN
500.0000 mg | Freq: Once | INTRAVENOUS | Status: AC
  Administered 2020-12-28: 500 mg via INTRAVENOUS
  Filled 2020-12-27: qty 500

## 2020-12-27 MED ORDER — DILTIAZEM HCL-DEXTROSE 125-5 MG/125ML-% IV SOLN (PREMIX)
5.0000 mg/h | INTRAVENOUS | Status: DC
  Administered 2020-12-27: 8 mg/h via INTRAVENOUS
  Filled 2020-12-27 (×2): qty 125

## 2020-12-27 MED ORDER — FUROSEMIDE 10 MG/ML IJ SOLN
40.0000 mg | Freq: Every day | INTRAMUSCULAR | Status: DC
  Administered 2020-12-27 – 2020-12-28 (×2): 40 mg via INTRAVENOUS
  Filled 2020-12-27 (×2): qty 4

## 2020-12-27 MED ORDER — MAGNESIUM SULFATE 2 GM/50ML IV SOLN
2.0000 g | Freq: Once | INTRAVENOUS | Status: AC
  Administered 2020-12-27: 2 g via INTRAVENOUS
  Filled 2020-12-27: qty 50

## 2020-12-27 MED ORDER — DOCUSATE SODIUM 100 MG PO CAPS: 100.0000 mg | ORAL_CAPSULE | Freq: Two times a day (BID) | ORAL | Status: DC | PRN

## 2020-12-27 MED ORDER — ORAL CARE MOUTH RINSE
15.0000 mL | OROMUCOSAL | Status: DC
  Administered 2020-12-27 – 2020-12-28 (×6): 15 mL via OROMUCOSAL

## 2020-12-27 MED ORDER — DEXTROSE 5 % IV SOLN: INTRAVENOUS | Status: AC | PRN

## 2020-12-27 NOTE — ED Notes (Signed)
CRITICAL LACTIC 5.3 will notified MD

## 2020-12-27 NOTE — Consult Note (Signed)
Canceled Stroke Code Note  This is a 75 yo man with significant cardiac history who was initially called as a code stroke in the field.  EMS was called for shortness of breath and tremulousness with diaphoresis lasting for 2 hours. Per EMS on their arrival he was alert, fully oriented, and had no focal neuro deficits.  He then became acutely diaphoretic and went unresponsive.  His O2 sats at this point were in the 20s and he was intubated in the field.  He was given 5 mg of Versed and arrived in ED. At no point was he reported to have signs c/f stroke and he was in a fib with RVR with runs of NSVT on arrival. D/w ED attending who was concerned about ACS or aortic dissection. We agreed to cancel the stroke code. She will re-engage neurology if we can be of further help with this patient.   Su Monks, MD [facility] [phone]  If 7pm- 7am, please page neurology on call as listed in [place].

## 2020-12-27 NOTE — Progress Notes (Signed)
ANTICOAGULATION CONSULT NOTE - Initial Consult  Pharmacy Consult for heparin Indication: atrial fibrillation  Allergies  Allergen Reactions  . Clindamycin/Lincomycin     Developed C. difficile colitis 1 month after use    Patient Measurements:   Heparin Dosing Weight: 111 kg   Vital Signs: Temp: 97.6 F (36.4 C) (03/20 1730) Temp Source: Axillary (03/20 1730) BP: 121/101 (03/20 1930) Pulse Rate: 86 (03/20 1930)  Labs: Recent Labs    12/27/20 1728 12/27/20 1732 12/27/20 1733 12/27/20 1754  HGB 19.7* 19.0* 20.1* 18.7*  HCT 58.0* 58.0* 59.0* 55.0*  PLT  --  153  --   --   APTT  --  34  --   --   LABPROT  --  15.7*  --   --   INR  --  1.3*  --   --   CREATININE 1.10 1.38*  --   --   TROPONINIHS  --  27*  --   --     CrCl cannot be calculated (Unknown ideal weight.).   Medical History: Past Medical History:  Diagnosis Date  . ARF (acute renal failure) ([place]) 04/15/2019  . Atherosclerotic heart disease of native coronary artery with other forms of angina pectoris ([place]) 04/26/2019  . Atrial fibrillation ([place])    initial diagnoses 2012  . B12 deficiency    per patient previously taking shots  . C. difficile colitis 04/17/2019  . C. difficile diarrhea   . CAD (coronary artery disease), native coronary artery 04/12/2007   History of MI. 2 stents. Plavix. Imdur 76m 10/28/15 cath Dr. CBurt Knack Findings: severe ostial LCx stenosis, patent LAD stent, patent dominant RCA supplies diagonal collateral.    . Chronic combined systolic and diastolic CHF (congestive heart failure) ([place] 04/15/2019  . CORONARY ARTERY DISEASE 04/12/2007   2 stents last in 2006.   .Marland KitchenCystoid macular edema of right eye 01/16/2020  . Cystoid macular edema of right eye 01/16/2020  . DIABETES MELLITUS, TYPE II 04/16/2007  . Diverticulosis of colon (without mention of hemorrhage)   . GERD (gastroesophageal reflux disease) 08/09/2011  . HYPERLIPIDEMIA 04/16/2007   reveal study. not sure of medication  . Hyperlipidemia  04/16/2007   10/24 reveal study end. Atorvastatin 225m . HYPERTENSION 04/12/2007  . HYPOTHYROIDISM 04/12/2007  . Hypothyroidism 04/12/2007   Synthroid 5032m  . Ischemic cardiomyopathy    cath 35-40%  . Morbid obesity ([place]/30/2012  . MYOCARDIAL INFARCTION, HX OF 04/12/2007  . OBESITY 06/16/2009  . Obstructive sleep apnea 04/12/2007   02/2011 - AHI 96/h CPAP 13, Lg FF     . SLEEP APNEA 04/12/2007   CPAP  . Stroke ([facility] . Type II diabetes mellitus with peripheral circulatory disorder ([facility]/04/2007   Dr. GheCruzita Lederernages. Uses fructosamine.    . UMarland KitchenCERATIVE COLITIS, LEFT SIDED 11/23/2010  . Ulcerative pancolitis without complication ([place]CCLaw[place]0/35/57/3220Ulcerative Colitis. Mesalamine.   . VMarland KitchenTAMIN B12 DEFICIENCY 11/29/2010    Medications:  (Not in a hospital admission)   Assessment: [age]M who was found unresponsive and intubated en route. Pt was on dabigatran at home for Afib. Pharmacy consulted to resume anticoagulation with IV heparin. CT head neg for bleed.   H/H elevated. Plt wnl. SCr 1.38   Goal of Therapy:  Heparin level 0.3-0.7 units/ml Monitor platelets by anticoagulation protocol: Yes   Plan:  -Start heparin 1500 units/hr at 10 PM today since unsure when AM dose of dabigatran was taken. No heparin bolus -F/u 8 hr  anti-Xa level -Monitor daily anti-Xa level, CBC and s/s of bleeding   Albertina Parr, PharmD., BCPS, BCCCP Clinical Pharmacist Please refer to [facility] for unit-specific pharmacist

## 2020-12-27 NOTE — ED Provider Notes (Signed)
[facility] EMERGENCY DEPARTMENT Provider Note   CSN: [id number] Arrival date & time: 12/27/20  1715     History Chief Complaint  Patient presents with  . Code Stroke    [name] is a 75 y.o. male.  Patient is a 75 year old male with a history of CAD, Afib, CHF, OSA on CPAP, morbid obesity, HTN, HLD, and DM who presents as unresponsive.  Patient presents via EMS.  They were called out to patient's house for shortness of breath and abnormal sensation feeling in his hands.  They report that while they were in route, patient went unresponsive.  He was intubated in the field.  He was given 5 of Versed for intubation but otherwise did not need paralyzing agents.  Patient denied any sensation of chest pain with EMS.  He was hypertensive prior to going unresponsive.  Patient remained hypertensive in route to the hospital.  His rhythm strip with EMS was originally A. fib but then showed a wide-complex tachycardia closer to the hospital.  On arrival here heart rhythm shows wide-complex tachycardia.  He is unresponsive.  He is intubated and being bagged.  Pulses are present.    Past Medical History:  Diagnosis Date  . ARF (acute renal failure) ([place]) 04/15/2019  . Atherosclerotic heart disease of native coronary artery with other forms of angina pectoris ([place]) 04/26/2019  . Atrial fibrillation ([place])    initial diagnoses 2012  . B12 deficiency    per patient previously taking shots  . C. difficile colitis 04/17/2019  . C. difficile diarrhea   . CAD (coronary artery disease), native coronary artery 04/12/2007   History of MI. 2 stents. Plavix. Imdur 41m 10/28/15 cath Dr. CBurt Knack Findings: severe ostial LCx stenosis, patent LAD stent, patent dominant RCA supplies diagonal collateral.    . Chronic combined systolic and diastolic CHF (congestive heart failure) (HCosmos 04/15/2019  . CORONARY ARTERY DISEASE 04/12/2007   2 stents last in 2006.   .Marland KitchenCystoid macular edema of right eye  01/16/2020  . Cystoid macular edema of right eye 01/16/2020  . DIABETES MELLITUS, TYPE II 04/16/2007  . Diverticulosis of colon (without mention of hemorrhage)   . GERD (gastroesophageal reflux disease) 08/09/2011  . HYPERLIPIDEMIA 04/16/2007   reveal study. not sure of medication  . Hyperlipidemia 04/16/2007   10/24 reveal study end. Atorvastatin 252m . HYPERTENSION 04/12/2007  . HYPOTHYROIDISM 04/12/2007  . Hypothyroidism 04/12/2007   Synthroid 5030m  . Ischemic cardiomyopathy    cath 35-40%  . Morbid obesity (HCCManley Hot Springs0/30/2012  . MYOCARDIAL INFARCTION, HX OF 04/12/2007  . OBESITY 06/16/2009  . Obstructive sleep apnea 04/12/2007   02/2011 - AHI 96/h CPAP 13, Lg FF     . SLEEP APNEA 04/12/2007   CPAP  . Stroke (HCCCull[place] . Type II diabetes mellitus with peripheral circulatory disorder (HCCAzle/04/2007   Dr. GheCruzita Lederernages. Uses fructosamine.    . UMarland KitchenCERATIVE COLITIS, LEFT SIDED 11/23/2010  . Ulcerative pancolitis without complication (HCCOwyhee0/41/66/0630Ulcerative Colitis. Mesalamine.   . VMarland KitchenTAMIN B12 DEFICIENCY 11/29/2010    Patient Active Problem List   Diagnosis Date Noted  . Acute on chronic respiratory failure with hypoxia (HCCClear Lake Shores3/20/2022  . Atrial fibrillation with RVR (HCCMount Cory . Acute encephalopathy   . Moderate nonproliferative diabetic retinopathy of both eyes (HCCKilldeer4/05/2020  . Atherosclerotic heart disease of native coronary artery with other forms of angina pectoris (HCCLower Elochoman7/17/2020  . C. difficile colitis 04/17/2019  . Dehydration  04/15/2019  . Diarrhea 04/15/2019  . ARF (acute renal failure) ([place]) 04/15/2019  . Chronic combined systolic and diastolic CHF (congestive heart failure) ([place]) 04/15/2019  . Hypokalemia 04/15/2019  . AKI (acute kidney injury) ([place])   . Acute on chronic combined systolic and diastolic heart failure ([place]) 02/07/2018  . Former smoker 08/11/2016  . MCI (mild cognitive impairment) 01/19/2016  . History of transient ischemic attack (TIA) 10/24/2015  . Primary  osteoarthritis of left knee 02/09/2015  . CHF (congestive heart failure), NYHA class II ([facility]) 05/09/2014  . Allergic rhinitis 06/25/2012  . Ulcerative pancolitis without complication ([place]) 78/29/5621  . GERD (gastroesophageal reflux disease) 08/09/2011  . Morbid obesity ([place]) 08/09/2011  . Atrial fibrillation ([place]) 12/08/2010  . VITAMIN B12 DEFICIENCY 11/29/2010  . Type II diabetes mellitus with peripheral circulatory disorder ([place]) 04/16/2007  . Hyperlipidemia 04/16/2007  . Hypothyroidism 04/12/2007  . Essential hypertension 04/12/2007  . CAD (coronary artery disease), native coronary artery 04/12/2007  . Obstructive sleep apnea 04/12/2007    Past Surgical History:  Procedure Laterality Date  . CARDIAC CATHETERIZATION  10/2002   STENT. 2 stents Dr. Maurene Capes  . CARDIAC CATHETERIZATION N/A 10/28/2015   Procedure: Right/Left Heart Cath and Coronary Angiography;  Surgeon: Sherren Mocha, MD;  Location: [facility];  Service: Cardiovascular;  Laterality: N/A;  . CATARACT EXTRACTION     2021 bilateral eyes   . CORONARY STENT PLACEMENT  2008   LAD   . RIGHT/LEFT HEART CATH AND CORONARY ANGIOGRAPHY N/A 06/01/2018   Procedure: RIGHT/LEFT HEART CATH AND CORONARY ANGIOGRAPHY;  Surgeon: Sherren Mocha, MD;  Location: [facility];  Service: Cardiovascular;  Laterality: N/A;  . TONSILLECTOMY         Family History  Problem Relation Age of Onset  . Heart attack Mother        mid 39s  . Heart disease Father        H/O CAD, CABG, VALVE SURGERY  . Hypertension Brother   . Obesity Brother   . Stomach cancer Maternal Aunt   . Stomach cancer Paternal Grandmother        ? colon   . Colon cancer Neg Hx     Social History   Tobacco Use  . Smoking status: Former Smoker    Packs/day: 1.00    Years: 5.00    Pack years: 5.00    Types: Cigarettes    Quit date: 04/13/1966    Years since quitting: 54.7  . Smokeless tobacco: Never Used  Vaping Use  . Vaping Use: Never used   Substance Use Topics  . Alcohol use: No    Alcohol/week: 0.0 standard drinks    Comment: no more beer  . Drug use: No    Home Medications Prior to Admission medications   Medication Sig Start Date End Date Taking? Authorizing Provider  APRISO 0.375 g 24 hr capsule Take 4 capsules (1.5 g total) by mouth daily. 08/19/20  Yes Erenest Rasher, PA-C  aspirin EC 81 MG tablet Take 81 mg by mouth daily. Swallow whole.   Yes [provider]  atorvastatin (LIPITOR) 20 MG tablet TAKE ONE TABLET (20MG TOTAL) BY MOUTH DAILY 02/24/20  Yes Marin Olp, MD  carvedilol (COREG) 25 MG tablet TAKE ONE TABLET BY MOUTH TWICE A DAY 01/13/20  Yes Allred, Jeneen Rinks, MD  clopidogrel (PLAVIX) 75 MG tablet TAKE ONE TABLET (75MG TOTAL) BY MOUTH DAILY 04/09/20  Yes Marin Olp, MD  Coenzyme Q10 (COQ10 PO) Take 1  capsule by mouth daily with supper.   Yes [provider]  empagliflozin (JARDIANCE) 10 MG TABS tablet TAKE ONE (1) TABLET BY MOUTH EVERY DAY 11/06/19  Yes Philemon Kingdom, MD  ENTRESTO 97-103 MG TAKE ONE TABLET BY MOUTH TWICE A DAY 11/18/20  Yes Bensimhon, Shaune Pascal, MD  fish oil-omega-3 fatty acids 1000 MG capsule Take 1 g by mouth daily with supper.   Yes [provider]  furosemide (LASIX) 40 MG tablet TAKE ONE TABLET BY MOUTH EVERY DAY. 09/17/20  Yes Bensimhon, Shaune Pascal, MD  glipiZIDE (GLUCOTROL) 10 MG tablet Take 1 tablet (10 mg total) by mouth 2 (two) times daily before a meal. 11/06/19  Yes Philemon Kingdom, MD  isosorbide mononitrate (IMDUR) 60 MG 24 hr tablet TAKE ONE (1) TABLET BY MOUTH EVERY DAY. Please make yearly appt with [name] for June 2022 for future refills. Thank you 1st attempt 12/17/20  Yes Sherren Mocha, MD  PRADAXA 150 MG CAPS capsule TAKE ONE (1) CAPSULE BY MOUTH TWICE A DAY. (EVERY 12 HOURS.) 09/17/20  Yes Sherren Mocha, MD  saccharomyces boulardii (FLORASTOR) 250 MG capsule Take 1 capsule (250 mg total) by mouth 2 (two) times daily. 04/22/19  Yes Ladene Artist, MD  sitaGLIPtin (JANUVIA) 100 MG tablet TAKE ONE (1) TABLET BY MOUTH EVERY DAY 11/02/20  Yes Philemon Kingdom, MD  spironolactone (ALDACTONE) 25 MG tablet TAKE ONE (1) TABLET BY MOUTH EVERY DAY 10/27/20  Yes Sherren Mocha, MD  SYNTHROID 50 MCG tablet TAKE ONE TABLET BY MOUTH DAILY BEFORE BREAKFAST 12/14/20  Yes Philemon Kingdom, MD  dicyclomine (BENTYL) 10 MG capsule TAKE ONE CAPSULE (10MG TOTAL) BY MOUTH TWO TIMES DAILY AS NEEDED FOR DIARRHEA OR ABDOMINAL CRAMPING 10/28/20   Mahala Menghini, PA-C  glucose blood test strip Use as instructed to test 4 time daily 07/03/19   Philemon Kingdom, MD    Allergies    Clindamycin/lincomycin  Review of Systems   Review of Systems  Unable to perform ROS: Patient unresponsive    Physical Exam Updated Vital Signs BP (!) 145/83   Pulse 82   Temp 98.3 F (36.8 C) (Oral)   Resp 20   Wt 110 kg   SpO2 97%   BMI 37.98 kg/m   Physical Exam Constitutional:      Appearance: He is obese.     Interventions: He is intubated.     Comments: Not responsive  HENT:     Head: Normocephalic and atraumatic.     Right Ear: External ear normal.     Left Ear: External ear normal.     Nose: Nose normal.     Mouth/Throat:     Comments: ETT in place Eyes:     Conjunctiva/sclera: Conjunctivae normal.     Pupils: Pupils are equal, round, and reactive to light.  Cardiovascular:     Rate and Rhythm: Tachycardia present.     Pulses: Normal pulses.     Comments: Wide-complex tachycardia noted on the monitor Strong 2+ DP pulses noted bilaterally, 2+ radial pulses noted bilaterally Pulmonary:     Effort: He is intubated.     Breath sounds: Normal breath sounds.     Comments: Patient arrives intubated however is breathing over bagging attempt Abdominal:     General: There is distension.  Musculoskeletal:     Right lower leg: No edema.     Left lower leg: No edema.  Skin:    General: Skin is warm.     Capillary Refill: Capillary  refill takes less  than 2 seconds.  Neurological:     Mental Status: He is unresponsive.     GCS: GCS eye subscore is 1. GCS verbal subscore is 1. GCS motor subscore is 1.     Comments: GCS 3T     ED Results / Procedures / Treatments   Labs (all labs ordered are listed, but only abnormal results are displayed) Labs Reviewed  CBC - Abnormal; Notable for the following components:      Result Value   WBC 14.9 (*)    RBC 6.18 (*)    Hemoglobin 19.0 (*)    HCT 58.0 (*)    All other components within normal limits  COMPREHENSIVE METABOLIC PANEL - Abnormal; Notable for the following components:   Sodium 134 (*)    CO2 19 (*)    Glucose, Bld 209 (*)    Creatinine, Ser 1.38 (*)    Calcium 8.2 (*)    Total Bilirubin 1.4 (*)    GFR, Estimated 54 (*)    All other components within normal limits  PROTIME-INR - Abnormal; Notable for the following components:   Prothrombin Time 15.7 (*)    INR 1.3 (*)    All other components within normal limits  LACTIC ACID, PLASMA - Abnormal; Notable for the following components:   Lactic Acid, Venous 5.3 (*)    All other components within normal limits  BRAIN NATRIURETIC PEPTIDE - Abnormal; Notable for the following components:   B Natriuretic Peptide 880.4 (*)    All other components within normal limits  HEMOGLOBIN A1C - Abnormal; Notable for the following components:   Hgb A1c MFr Bld 5.9 (*)    All other components within normal limits  GLUCOSE, CAPILLARY - Abnormal; Notable for the following components:   Glucose-Capillary 175 (*)    All other components within normal limits  GLUCOSE, CAPILLARY - Abnormal; Notable for the following components:   Glucose-Capillary 175 (*)    All other components within normal limits  I-STAT VENOUS BLOOD GAS, ED - Abnormal; Notable for the following components:   pH, Ven 7.146 (*)    pCO2, Ven 68.0 (*)    Acid-base deficit 8.0 (*)    Calcium, Ion 1.03 (*)    HCT 59.0 (*)    Hemoglobin 20.1 (*)    All other components within  normal limits  I-STAT CHEM 8, ED - Abnormal; Notable for the following components:   Glucose, Bld 207 (*)    Calcium, Ion 1.00 (*)    Hemoglobin 19.7 (*)    HCT 58.0 (*)    All other components within normal limits  I-STAT ARTERIAL BLOOD GAS, ED - Abnormal; Notable for the following components:   pH, Arterial 7.284 (*)    Acid-base deficit 6.0 (*)    Calcium, Ion 1.14 (*)    HCT 55.0 (*)    Hemoglobin 18.7 (*)    All other components within normal limits  TROPONIN I (HIGH SENSITIVITY) - Abnormal; Notable for the following components:   Troponin I (High Sensitivity) 27 (*)    All other components within normal limits  TROPONIN I (HIGH SENSITIVITY) - Abnormal; Notable for the following components:   Troponin I (High Sensitivity) 337 (*)    All other components within normal limits  RESP PANEL BY RT-PCR (FLU A&B, COVID) ARPGX2  MRSA PCR SCREENING  APTT  ETHANOL  LACTIC ACID, PLASMA  TSH  RAPID URINE DRUG SCREEN, HOSP PERFORMED  BLOOD GAS, ARTERIAL  MAGNESIUM  PHOSPHORUS  BASIC METABOLIC PANEL  HEPARIN LEVEL (UNFRACTIONATED)  CBC  TRIGLYCERIDES  I-STAT VENOUS BLOOD GAS, ED  I-STAT CHEM 8, ED    EKG EKG Interpretation  Date/Time:  Sunday December 27 2020 17:33:11 EDT Ventricular Rate:  147 PR Interval:    QRS Duration: 131 QT Interval:  320 QTC Calculation: 501 R Axis:   -62 Text Interpretation: Atrial fibrillation with rapid ventricular response Left bundle branch block resolution of Ventricular tachycardia from prior tracing Confirmed by Blanchie Dessert ([id number]) on 12/27/2020 5:38:19 PM   Radiology CT Head Wo Contrast  Result Date: 12/27/2020 CLINICAL DATA:  Altered mental status. EXAM: CT HEAD WITHOUT CONTRAST TECHNIQUE: Contiguous axial images were obtained from the base of the skull through the vertex without intravenous contrast. COMPARISON:  10/28/2015 FINDINGS: Brain: There is atrophy and chronic small vessel disease changes. Old right basal ganglia lacunar  infarct, stable. No acute intracranial abnormality. Specifically, no hemorrhage, hydrocephalus, mass lesion, acute infarction, or significant intracranial injury. Vascular: No hyperdense vessel or unexpected calcification. Skull: No acute calvarial abnormality. Sinuses/Orbits: No acute findings Other: None IMPRESSION: Atrophy, chronic microvascular disease. No acute intracranial abnormality. Electronically Signed   By: Rolm Baptise M.D.   On: 12/27/2020 19:03   DG Chest Portable 1 View  Result Date: 12/27/2020 CLINICAL DATA:  Intubation, repositioning of endotracheal tube. EXAM: PORTABLE CHEST 1 VIEW COMPARISON:  12/27/2020 at 5:44 p.m. FINDINGS: The endotracheal tube tip is 4 cm above the carina, satisfactorily position. Nasogastric tube terminates in the stomach body. Low lung volumes are present, causing crowding of the pulmonary vasculature. Mild enlargement of the cardiopericardial silhouette with indistinct hazy opacities in the lungs. Increasing bibasilar airspace opacities compared to previous. Overall the airspace opacities are greater on the right than the left may reflect asymmetric edema or pneumonia. Irregular contour of the aortic arch possibly due to adjacent airspace opacity, strictly speaking mediastinal hematoma or dissection cannot be excluded on radiography. IMPRESSION: 1. Endotracheal tube tip is 4 cm above the carina. Nasogastric tube terminates in the stomach body. 2. Increasing bibasilar airspace opacities, right greater than left, compatible with asymmetric edema or pneumonia. 3. Irregular contour of the aortic arch, strictly speaking mediastinal hematoma or dissection cannot be excluded on radiography. Electronically Signed   By: Van Clines M.D.   On: 12/27/2020 20:30   DG Chest Portable 1 View  Addendum Date: 12/27/2020   ADDENDUM REPORT: 12/27/2020 18:16 ADDENDUM: The original report was by Dr. Van Clines. The following addendum is by Dr. Van Clines: Critical  Value/emergent results were called by telephone at the time of interpretation on 12/27/2020 at 6:08 pm to provider Dr. Blanchie Dessert , who verbally acknowledged these results. Electronically Signed   By: Van Clines M.D.   On: 12/27/2020 18:16   Result Date: 12/27/2020 CLINICAL DATA:  Respiratory failure.  Shortness of breath EXAM: PORTABLE CHEST 1 VIEW COMPARISON:  10/24/2015 FINDINGS: The endotracheal tube is just into the right mainstem bronchus near the carina. Retraction of 3 cm recommended. Defibrillator pads noted. Indistinct airspace opacities in the right lung and to a lesser extent in the left perihilar region potentially from asymmetric edema or pneumonia. No blunting of the costophrenic angles. IMPRESSION: 1. The endotracheal tube is just into the right mainstem bronchus near the carina (malpositioned). Retraction of 3 cm recommended. 2. Indistinct airspace opacities in the right lung and to a lesser extent in the left perihilar region, potentially from asymmetric edema or pneumonia. Radiology assistant personnel have been notified to put  me in telephone contact with the referring physician or the referring physician's clinical representative in order to discuss these findings. Once this communication is established I will issue an addendum to this report for documentation purposes. Electronically Signed: By: Van Clines M.D. On: 12/27/2020 18:05   DG Abd Portable 1V  Result Date: 12/27/2020 CLINICAL DATA:  OG tube placement EXAM: PORTABLE ABDOMEN - 1 VIEW COMPARISON:  None. FINDINGS: OG tube tip is in the stomach.  Nonobstructive bowel gas pattern. IMPRESSION: OG tube tip in the stomach. Electronically Signed   By: Rolm Baptise M.D.   On: 12/27/2020 23:40    Procedures Procedures   Medications Ordered in ED Medications  diltiazem (CARDIZEM) 1 mg/mL load via infusion 20 mg (20 mg Intravenous Bolus from Bag 12/27/20 1909)    And  diltiazem (CARDIZEM) 125 mg in dextrose 5%  125 mL (1 mg/mL) infusion (8 mg/hr Intravenous New Bag/Given 12/27/20 1907)  fentaNYL 2586mg in NS 2574m(1060mml) infusion-PREMIX (125 mcg/hr Intravenous Rate/Dose Change 12/27/20 2106)  fentaNYL (SUBLIMAZE) bolus via infusion 25 mcg (has no administration in time range)  azithromycin (ZITHROMAX) 500 mg in sodium chloride 0.9 % 250 mL IVPB (has no administration in time range)  pantoprazole (PROTONIX) injection 40 mg (40 mg Intravenous Given 12/27/20 2244)  acetaminophen (TYLENOL) tablet 650 mg (has no administration in time range)  docusate sodium (COLACE) capsule 100 mg (has no administration in time range)  ondansetron (ZOFRAN) injection 4 mg (has no administration in time range)  albuterol (PROVENTIL) (2.5 MG/3ML) 0.083% nebulizer solution 2.5 mg (has no administration in time range)  calcium gluconate 2 g/ 100 mL sodium chloride IVPB (2,000 mg Intravenous New Bag/Given 12/27/20 2312)  magnesium sulfate IVPB 2 g 50 mL (2 g Intravenous New Bag/Given 12/27/20 2305)  atorvastatin (LIPITOR) tablet 20 mg (has no administration in time range)  sacubitril-valsartan (ENTRESTO) 97-103 mg per tablet (has no administration in time range)  furosemide (LASIX) injection 40 mg (40 mg Intravenous Given 12/27/20 2240)  heparin ADULT infusion 100 units/mL (25000 units/250m36m1,500 Units/hr Intravenous New Bag/Given 12/27/20 2254)  chlorhexidine gluconate (MEDLINE KIT) (PERIDEX) 0.12 % solution 15 mL (15 mLs Mouth Rinse Given 12/27/20 2222)  MEDLINE mouth rinse (15 mLs Mouth Rinse Given 12/27/20 2326)  propofol (DIPRIVAN) 1000 MG/100ML infusion (5 mcg/kg/min  110 kg Intravenous New Bag/Given 12/27/20 2318)  polyethylene glycol (MIRALAX / GLYCOLAX) packet 17 g (has no administration in time range)  insulin aspart (novoLOG) injection 0-15 Units (3 Units Subcutaneous Given 12/27/20 2337)  0.9 %  sodium chloride infusion (250 mLs Intravenous New Bag/Given 12/27/20 2301)  aspirin chewable tablet 81 mg (81 mg Per Tube  Given 12/27/20 2357)  amiodarone (CORDARONE) 150 mg in dextrose 5 % 100 mL bolus (150 mg Intravenous Canceled Entry 12/27/20 1721)  propofol (DIPRIVAN) 1000 MG/100ML infusion ( Intravenous Stopped 12/27/20 2030)  amiodarone (CORDARONE) injection (150 mg Intravenous Given 12/27/20 1720)  fentaNYL (SUBLIMAZE) injection 50 mcg (50 mcg Intravenous Bolus 12/27/20 2025)  cefTRIAXone (ROCEPHIN) 1 g in sodium chloride 0.9 % 100 mL IVPB (0 g Intravenous Stopped 12/27/20 2123)  furosemide (LASIX) injection 40 mg (40 mg Intravenous Given 12/27/20 2042)    ED Course  I have reviewed the triage vital signs and the nursing notes.  Pertinent labs & imaging results that were available during my care of the patient were reviewed by me and considered in my medical decision making (see chart for details).    MDM Rules/Calculators/A&P  Patient is a 75 year old male who presents unresponsive with EMS.  Patient was complaining of shortness of breath when he went unresponsive in route to the hospital.  Patient was intubated in the field and arrives intubated being bagged.  Patient arrives with pulses and warm distal extremities.  EKG at that time showing wide-complex tachycardia.  Patient given 150 mg of amiodarone.  Patient's rhythm improved to show atrial fibrillation with RVR.  QRS narrowed.  It was noted that patient was breathing over the bag and therefore sedation initiated in order to have him up to the ventilator.  Patient started on propofol given initial bolus and then drip.  Patient later added on fentanyl for further sedation and analgesic control.  Work-up significant for negative Covid.  Elevated hemoglobin 19.0.  Leukocytosis to 14.9.  Mild AKI.  Acidosis with a lactic of 5.3.  The elevated troponin 27.  An elevated BNP.  Slightly acidotic on initial blood gas.  Ventilator adjusted.  Even with correction of hypercarbia, patient still with acidosis.  He had without significant findings,  however this x-ray showing possible pulmonary edema versus pneumonia.  Given possible pneumonia and acute decondition, patient started on IV antibiotics.  Additionally, patient given dose of Lasix given elevated BNP and findings concerning for pulmonary edema on CXR.  Patient's heart rate later increased showing a rhythm of A. fib with RVR.  Decided to rate control patient with diltiazem.  Critical care medicine consulted for admission.  Unclear for definitive reason patient went unresponsive with EMS.  Given history provided by family, history given by EMS this, and patient's presentation concerning for aspiration event, pulmonary edema, ischemic cardiac event, vs pneumonia.  Treated with antibiotics and treated with diuretics.  Blood pressures remained stable.  Heart rate later improved after rate control with diltiazem.  Patient remained stable on vent.  Patient transported out of the emergency department to ICU in stable but critical condition.   Final Clinical Impression(s) / ED Diagnoses Final diagnoses:  Encounter for orogastric tube placement    Rx / DC Orders ED Discharge Orders    None       Kugler, [place], MD 12/28/20 0001    Blanchie Dessert, MD 12/30/20 [phone]

## 2020-12-27 NOTE — ED Triage Notes (Addendum)
Assume care from EMS, Pt was intubated prior arrival. EMS was called out for SOB and tremors/shaking  for 2 hours. EMS states pt mental status and oxygen was fine and then pt became diaphoretic and then unresponsive. 70m OF Versed was given by EMS and then intubated pt. EMS reports pt has extensive cardiac hx, stroke (on blood thinners), MI. Pt present in the ED sinus tach/VT  A fib RVR.   CBG 144 225/150

## 2020-12-27 NOTE — ED Provider Notes (Incomplete)
Patient is a 75 year old male with extensive cardiac history as well as stroke history who has not been taking his medications reliably being called out today for not feeling well and being short of breath.  In route with EMS patient had acute respiratory arrest.  Patient never lost pulses and had maintained blood pressure but required intubation in route.  Upon arrival here patient's tachycardic in the 140s to 150s and an initial rhythm that looked like atrial fibrillation with RVR.  Patient is breathing over the vent but is otherwise not responding to verbal cues.  He was given Versed after intubation due to agitation.  Patient then went into V. tach with still palpable pulses in blood pressure and was given 150 mg of amiodarone.  Rhythm did improve and complexes narrowed.  Patient was given propofol sedation due to agitation on the vent.  Heart rate improved to the 120s oxygen saturation was maintained.  X-ray did show that the ET tube was at the carina and it was pulled back 3 cm.  Initial gas showed respiratory acidosis and with vent management second ABG was improved with a pH of 7.28.  Patient did have what appeared to be pulmonary edema on exam and initially was extremely hypertensive in the setting of not taking his medications.  Suspect this resulted in the respiratory arrest.  Lactic acid was 5 and leukocytosis of 14,000 with hemoglobin of 19, CMP with mild elevation of creatinine of 1.38 but otherwise no acute changes.  Covid is negative, head CT is negative, initial troponin of 27.  CTA of the chest abdomen pelvis done to ensure no dissection given the acute change in mental status.  Patient was placed on adult drip for heart rate control.  He remains comfortable on the propofol.  Will admit to critical care.  No evidence of STEMI at this time.  MDM Number of Diagnoses or Management Options   Amount and/or Complexity of Data Reviewed Clinical lab tests: ordered and reviewed Tests in the radiology  section of CPT: ordered and reviewed Tests in the medicine section of CPT: ordered and reviewed Decide to obtain previous medical records or to obtain history from someone other than the patient: yes Obtain history from someone other than the patient: yes Review and summarize past medical records: yes Discuss the patient with other providers: yes Independent visualization of images, tracings, or specimens: yes  Risk of Complications, Morbidity, and/or Mortality Presenting problems: high Diagnostic procedures: high Management options: high  CRITICAL CARE Performed by: Whitney Plunkett Total critical care time: 30 minutes Critical care time was exclusive of separately billable procedures and treating other patients. Critical care was necessary to treat or prevent imminent or life-threatening deterioration. Critical care was time spent personally by me on the following activities: development of treatment plan with patient and/or surrogate as well as nursing, discussions with consultants, evaluation of patient's response to treatment, examination of patient, obtaining history from patient or surrogate, ordering and performing treatments and interventions, ordering and review of laboratory studies, ordering and review of radiographic studies, pulse oximetry and re-evaluation of patient's condition.

## 2020-12-27 NOTE — Progress Notes (Signed)
Pt transported to 39mwith no complications.

## 2020-12-27 NOTE — Progress Notes (Signed)
[facility] Progress Note Patient Name: [name] DOB: 01-18-1946 MRN: [id number]   Date of Service  12/27/2020  HPI/Events of Note  Troponin #1 = 337 - Already on Heparin and Cardizem IV infusions for AFIB. EKG reveals Atrial fibrillation with rapid ventricular response. Left bundle branch block. Resolution of Ventricular tachycardia from prior tracing.  eICU Interventions  Plan: 1. ASA 81 mg per tube now and Q day.  2. Continue to cycle Troponin.      Intervention Category Major Interventions: Other:  Lysle Dingwall 12/27/2020, 11:04 PM

## 2020-12-27 NOTE — ED Notes (Signed)
RNs were unable to place OG tube, will ask MD

## 2020-12-27 NOTE — H&P (Addendum)
NAME:  [name], MRN:  [id number], DOB:  June 04, 1946, LOS: 0 ADMISSION DATE:  12/27/2020, CONSULTATION DATE:  12/27/20 REFERRING MD:  Maryan Rued- EM, CHIEF COMPLAINT:  Unresponsive / Respiratory Failure   History of Present Illness:  75 yo M PMH Afib (non-compliant with home anticoagulation), chronic combined systolic and diastolic HF (non-compliant with home meds), CAD, STEMI, TIAs, UC, DM, OSA on CPAP, Hypothyroidism who presented to ED 3/20 after becoming diaphoretic, SOB and shaky at home. Abrupt onset AMS associated with this. NIBP taken at home, reportedly SBP 200s per wife. Ems was dispatched and he was given nitro, after which time he became unresponsive and was intubated en route. Family recently identified that pt has been diverting home medications for the past few weeks-months.  In ED, CT H did not reveal evidence of acute CVA. pt initially in Afib but went into VT, maintaining pulse, and was given 151m amio, converting back into Afib. Started on dilt gtt.  Labs significant for Trop 27 BNP 880, LA 5, WBC 14 hgb 20, Cr 1.38   PCCM consulted for admission  Pertinent  Medical History  Afib TIA Systolic and diastolic HF UC DM  Hyopthyroidism CAD STEMI ICM OSA  Obesity Significant Hospital Events: Including procedures, antibiotic start and stop dates in addition to other pertinent events   . 3/20 admitted-- AMS + SOB, intubated by EMS. Started on rocephin and azithro in ED. Amio given for VT, then started on dilt gtt for Afib rvr.   Interim History / Subjective:  Remains in Afib Family at bedside   Objective   Blood pressure (!) 121/101, pulse 86, temperature 97.6 F (36.4 C), temperature source Axillary, resp. rate (!) 36, SpO2 93 %.    Vent Mode: PRVC FiO2 (%):  [60 %-100 %] 60 % Set Rate:  [18 bmp-20 bmp] 20 bmp Vt Set:  [520 mL] 520 mL PEEP:  [5 cmH20] 5 cmH20   Intake/Output Summary (Last 24 hours) at 12/27/2020 2015 Last data filed at 12/27/2020 1858 Gross  per 24 hour  Intake 50 ml  Output --  Net 50 ml   There were no vitals filed for this visit.  Examination: General: Chronically and critically ill appearing older adult M intubated sedated NAD  Neuro: sedated. Pinpoint pupils. Does not follow commands  HENT: NCAT anicteric sclera. Pink mmm ETT secure Lungs: Symmetrical chest expansion, mechanically ventilated. Bilateral rales Cardiovascular: irir s1s2. 1+ peripheral pulses.  Abdomen: Soft round nondistended. Normoactive bowel sounds GU: defer MSK: no acute joint deformity, cool and clammy to touch. No cyanosis  Skin: Pale, dried blood on R hand. Diaphoretic   Labs/imaging personally reviewed   3/20 CTH> no acute intracranial abnormality. 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   Resolved Hospital Problem list     Assessment & Plan:   Acute encephalopathy Hx TIA -CT H without evidence of CVA. Suspect r/y hypoxia  P -respiratory support as below -wean sedation as able for RASS goal 0 to -1   Acute on chronic respiratory failure with hypoxia OSA on CPAP (wife endorses compliance) Pulmonary edema  Possible CAP vs aspiration  P -full MV support  -VAP, pulm hygiene -diurese -WUA/SBT as appropriate -ok to continue rocephin and azithro for short course though suspect resp sx are more r/t cardiac process   Afib RVR (acute RVR on chronic Afib) -has not been taking home pradaxa VTach, improved (after 1525mamio) P -2g calcium, 2g mag -trend metabolic panel and optimize electrolytes -  ICU monitoring -will continue dilt gtt overnight, consider restarting home meds 3/21 AM -heparin gtt per pharmacist consult  Acute on chronic Systolic and Diastolic HF -last ECHO 6837 EF 25%, had been on entresto at home but has started diverting home meds x weeks-months CAD, s/p DES to LAD (2004) Hx STEMI (2008)  P -ECHO -restart entresto -qD lasix  HTN P -holding home antihypertensives while on dilt gtt    AKI P -trend renal indices, electrolytes -suspect this is in setting of decompensated Afib / HFrEF   Lactic Acidosis -suspected in setting of above. Does have a slight leukocytosis but overall picture more consistent with cardiac dysfunction over sepsis   Leukocytosis -might be reactive.  P -Conservatively, cont rocephin and azithro for possible CAP   DM P -SSI   UC -holding home mesalamine as cannot be given per tube  Hypothyroidism -holding home synthroid given med non-compliance, check TSH   Best practice (evaluated daily)  Diet:  NPO Pain/Anxiety/Delirium protocol (if indicated): Yes (RASS goal 0) VAP protocol (if indicated): Yes DVT prophylaxis: Systemic AC GI prophylaxis: PPI Glucose control:  SSI Yes Central venous access:  N/A Arterial line:  N/A Foley:  N/A Mobility:  bed rest  PT consulted: N/A Last date of multidisciplinary goals of care discussion -- 3/20 Admitting CCM NP and MD discussed plan for admission and code status with family.  Code Status:  full code Disposition: Admit to ICU   Labs   CBC: Recent Labs  Lab 12/27/20 1728 12/27/20 1732 12/27/20 1733 12/27/20 1754  WBC  --  14.9*  --   --   HGB 19.7* 19.0* 20.1* 18.7*  HCT 58.0* 58.0* 59.0* 55.0*  MCV  --  93.9  --   --   PLT  --  153  --   --     Basic Metabolic Panel: Recent Labs  Lab 12/27/20 1728 12/27/20 1732 12/27/20 1733 12/27/20 1754  NA 138 134* 138 137  K 4.0 3.8 3.9 4.3  CL 103 100  --   --   CO2  --  19*  --   --   GLUCOSE 207* 209*  --   --   BUN 20 16  --   --   CREATININE 1.10 1.38*  --   --   CALCIUM  --  8.2*  --   --    GFR: CrCl cannot be calculated (Unknown ideal weight.). Recent Labs  Lab 12/27/20 1732  WBC 14.9*  LATICACIDVEN 5.3*    Liver Function Tests: Recent Labs  Lab 12/27/20 1732  AST 34  ALT 16  ALKPHOS 110  BILITOT 1.4*  PROT 7.1  ALBUMIN 4.0   No results for input(s): LIPASE, AMYLASE in the last 168 hours. No results for  input(s): AMMONIA in the last 168 hours.  ABG    Component Value Date/Time   PHART 7.284 (L) 12/27/2020 1754   PCO2ART 45.0 12/27/2020 1754   PO2ART 107 12/27/2020 1754   HCO3 21.5 12/27/2020 1754   TCO2 23 12/27/2020 1754   ACIDBASEDEF 6.0 (H) 12/27/2020 1754   O2SAT 98.0 12/27/2020 1754     Coagulation Profile: Recent Labs  Lab 12/27/20 1732  INR 1.3*    Cardiac Enzymes: No results for input(s): CKTOTAL, CKMB, CKMBINDEX, TROPONINI in the last 168 hours.  HbA1C: Hemoglobin A1C  Date/Time Value Ref Range Status  07/08/2020 11:35 AM 6.3 (A) 4.0 - 5.6 % Final  11/06/2019 10:06 AM 5.9 (A) 4.0 - 5.6 % Final  Hgb A1c MFr Bld  Date/Time Value Ref Range Status  02/24/2020 03:33 PM 6.8 (H) 4.6 - 6.5 % Final    Comment:    Glycemic Control Guidelines for People with Diabetes:Non Diabetic:  <6%Goal of Therapy: <7%Additional Action Suggested:  >8%   04/26/2019 04:40 PM 6.9 (H) <5.7 % of total Hgb Final    Comment:    For someone without known diabetes, a hemoglobin A1c value of 6.5% or greater indicates that they may have  diabetes and this should be confirmed with a follow-up  test. . For someone with known diabetes, a value <7% indicates  that their diabetes is well controlled and a value  greater than or equal to 7% indicates suboptimal  control. A1c targets should be individualized based on  duration of diabetes, age, comorbid conditions, and  other considerations. . Currently, no consensus exists regarding use of hemoglobin A1c for diagnosis of diabetes for children. .     CBG: No results for input(s): GLUCAP in the last 168 hours.  Review of Systems:   Unable to obtain, intubated and sedated   Past Medical History:  He,  has a past medical history of ARF (acute renal failure) ([place]) (04/15/2019), Atherosclerotic heart disease of native coronary artery with other forms of angina pectoris ([place]) (04/26/2019), Atrial fibrillation ([place]), B12 deficiency, C. difficile  colitis (04/17/2019), C. difficile diarrhea, CAD (coronary artery disease), native coronary artery (04/12/2007), Chronic combined systolic and diastolic CHF (congestive heart failure) ([place]) (04/15/2019), CORONARY ARTERY DISEASE (04/12/2007), Cystoid macular edema of right eye (01/16/2020), Cystoid macular edema of right eye (01/16/2020), DIABETES MELLITUS, TYPE II (04/16/2007), Diverticulosis of colon (without mention of hemorrhage), GERD (gastroesophageal reflux disease) (08/09/2011), HYPERLIPIDEMIA (04/16/2007), Hyperlipidemia (04/16/2007), HYPERTENSION (04/12/2007), HYPOTHYROIDISM (04/12/2007), Hypothyroidism (04/12/2007), Ischemic cardiomyopathy, Morbid obesity ([place]) (08/09/2011), MYOCARDIAL INFARCTION, HX OF (04/12/2007), OBESITY (06/16/2009), Obstructive sleep apnea (04/12/2007), SLEEP APNEA (04/12/2007), Stroke ([place]), Type II diabetes mellitus with peripheral circulatory disorder ([place]) (04/16/2007), ULCERATIVE COLITIS, LEFT SIDED (11/23/2010), Ulcerative pancolitis without complication ([place]) (23/53/6144), and VITAMIN B12 DEFICIENCY (11/29/2010).   Surgical History:   Past Surgical History:  Procedure Laterality Date  . CARDIAC CATHETERIZATION  10/2002   STENT. 2 stents Dr. Maurene Capes  . CARDIAC CATHETERIZATION N/A 10/28/2015   Procedure: Right/Left Heart Cath and Coronary Angiography;  Surgeon: Sherren Mocha, MD;  Location: [facility];  Service: Cardiovascular;  Laterality: N/A;  . CATARACT EXTRACTION     2021 bilateral eyes   . CORONARY STENT PLACEMENT  2008   LAD   . RIGHT/LEFT HEART CATH AND CORONARY ANGIOGRAPHY N/A 06/01/2018   Procedure: RIGHT/LEFT HEART CATH AND CORONARY ANGIOGRAPHY;  Surgeon: Sherren Mocha, MD;  Location: [facility];  Service: Cardiovascular;  Laterality: N/A;  . TONSILLECTOMY       Social History:   reports that he quit smoking about 54 years ago. His smoking use included cigarettes. He has a 5.00 pack-year smoking history. He has never used smokeless tobacco. He reports that he does not  drink alcohol and does not use drugs.   Family History:  His family history includes Heart attack in his mother; Heart disease in his father; Hypertension in his brother; Obesity in his brother; Stomach cancer in his maternal aunt and paternal grandmother. There is no history of Colon cancer.   Allergies Allergies  Allergen Reactions  . Clindamycin/Lincomycin     Developed C. difficile colitis 1 month after use     Home Medications  Prior to Admission medications   Medication Sig Start  Date End Date Taking? Authorizing Provider  APRISO 0.375 g 24 hr capsule Take 4 capsules (1.5 g total) by mouth daily. 08/19/20   Erenest Rasher, PA-C  atorvastatin (LIPITOR) 20 MG tablet TAKE ONE TABLET (20MG TOTAL) BY MOUTH DAILY 02/24/20   Marin Olp, MD  carvedilol (COREG) 25 MG tablet TAKE ONE TABLET BY MOUTH TWICE A DAY 01/13/20   Allred, Jeneen Rinks, MD  clopidogrel (PLAVIX) 75 MG tablet TAKE ONE TABLET (75MG TOTAL) BY MOUTH DAILY 04/09/20   Marin Olp, MD  Coenzyme Q10 (COQ10 PO) Take 1 capsule by mouth daily with supper.    [provider]  diclofenac Sodium (VOLTAREN) 1 % GEL Apply 1 application topically as needed (knee pain).     [provider]  dicyclomine (BENTYL) 10 MG capsule TAKE ONE CAPSULE (10MG TOTAL) BY MOUTH TWO TIMES DAILY AS NEEDED FOR DIARRHEA OR ABDOMINAL CRAMPING 10/28/20   Mahala Menghini, PA-C  empagliflozin (JARDIANCE) 10 MG TABS tablet TAKE ONE (1) TABLET BY MOUTH EVERY DAY 11/06/19   Philemon Kingdom, MD  ENTRESTO 97-103 MG TAKE ONE TABLET BY MOUTH TWICE A DAY 11/18/20   Bensimhon, Shaune Pascal, MD  fish oil-omega-3 fatty acids 1000 MG capsule Take 1 g by mouth daily with supper.    [provider]  furosemide (LASIX) 40 MG tablet TAKE ONE TABLET BY MOUTH EVERY DAY. 09/17/20   Bensimhon, Shaune Pascal, MD  glipiZIDE (GLUCOTROL) 10 MG tablet Take 1 tablet (10 mg total) by mouth 2 (two) times daily before a meal. 11/06/19   Philemon Kingdom, MD   glucosamine-chondroitin 500-400 MG tablet Take 1 tablet by mouth daily as needed (knee flare up or pain).    [provider]  glucose blood test strip Use as instructed to test 4 time daily 07/03/19   Philemon Kingdom, MD  isosorbide mononitrate (IMDUR) 60 MG 24 hr tablet TAKE ONE (1) TABLET BY MOUTH EVERY DAY. Please make yearly appt with [name] for June 2022 for future refills. Thank you 1st attempt 12/17/20   Sherren Mocha, MD  PRADAXA 150 MG CAPS capsule TAKE ONE (1) CAPSULE BY MOUTH TWICE A DAY. (EVERY 12 HOURS.) 09/17/20   Sherren Mocha, MD  saccharomyces boulardii (FLORASTOR) 250 MG capsule Take 1 capsule (250 mg total) by mouth 2 (two) times daily. 04/22/19   Ladene Artist, MD  sitaGLIPtin (JANUVIA) 100 MG tablet TAKE ONE (1) TABLET BY MOUTH EVERY DAY 11/02/20   Philemon Kingdom, MD  spironolactone (ALDACTONE) 25 MG tablet TAKE ONE (1) TABLET BY MOUTH EVERY DAY 10/27/20   Sherren Mocha, MD  SYNTHROID 50 MCG tablet TAKE ONE TABLET BY MOUTH DAILY BEFORE BREAKFAST 12/14/20   Philemon Kingdom, MD     Critical care time: 50 minutes     CRITICAL CARE Performed by: Cristal Generous   Total critical care time: 50 minutes  Critical care time was exclusive of separately billable procedures and treating other patients. Critical care was necessary to treat or prevent imminent or life-threatening deterioration.  Critical care was time spent personally by me on the following activities: development of treatment plan with patient and/or surrogate as well as nursing, discussions with consultants, evaluation of patient's response to treatment, examination of patient, obtaining history from patient or surrogate, ordering and performing treatments and interventions, ordering and review of laboratory studies, ordering and review of radiographic studies, pulse oximetry and re-evaluation of patient's condition.  Eliseo Gum MSN, AGACNP-BC [place] [phone] If no answer,  [phone] 12/27/2020, 8:50 PM

## 2020-12-27 NOTE — Progress Notes (Signed)
Patient transported to CT and back without complications. RN at bedside.

## 2020-12-27 NOTE — ED Notes (Signed)
This RN CT, Per CT will be a while since two code stroke came in

## 2020-12-27 NOTE — ED Notes (Signed)
Granddaughter, Chilton Greathouse, would like to be called for any updates or if the pt becomes agitated [phone].

## 2020-12-28 ENCOUNTER — Inpatient Hospital Stay (HOSPITAL_COMMUNITY): Payer: Medicare Other

## 2020-12-28 ENCOUNTER — Encounter (HOSPITAL_COMMUNITY): Payer: Self-pay

## 2020-12-28 ENCOUNTER — Encounter (INDEPENDENT_AMBULATORY_CARE_PROVIDER_SITE_OTHER): Payer: Medicare Other | Admitting: Ophthalmology

## 2020-12-28 DIAGNOSIS — I4891 Unspecified atrial fibrillation: Secondary | ICD-10-CM | POA: Diagnosis not present

## 2020-12-28 DIAGNOSIS — I4821 Permanent atrial fibrillation: Secondary | ICD-10-CM

## 2020-12-28 DIAGNOSIS — I5043 Acute on chronic combined systolic (congestive) and diastolic (congestive) heart failure: Secondary | ICD-10-CM | POA: Diagnosis not present

## 2020-12-28 DIAGNOSIS — J9621 Acute and chronic respiratory failure with hypoxia: Secondary | ICD-10-CM | POA: Diagnosis not present

## 2020-12-28 LAB — TRIGLYCERIDES: Triglycerides: 29 mg/dL

## 2020-12-28 LAB — GLUCOSE, CAPILLARY
Glucose-Capillary: 103 mg/dL — ABNORMAL HIGH (ref 70–99)
Glucose-Capillary: 113 mg/dL — ABNORMAL HIGH (ref 70–99)
Glucose-Capillary: 133 mg/dL — ABNORMAL HIGH (ref 70–99)
Glucose-Capillary: 134 mg/dL — ABNORMAL HIGH (ref 70–99)
Glucose-Capillary: 139 mg/dL — ABNORMAL HIGH (ref 70–99)
Glucose-Capillary: 152 mg/dL — ABNORMAL HIGH (ref 70–99)
Glucose-Capillary: 176 mg/dL — ABNORMAL HIGH (ref 70–99)

## 2020-12-28 LAB — HEPARIN LEVEL (UNFRACTIONATED)
Heparin Unfractionated: 0.27 IU/mL — ABNORMAL LOW (ref 0.30–0.70)
Heparin Unfractionated: 0.28 IU/mL — ABNORMAL LOW (ref 0.30–0.70)

## 2020-12-28 LAB — CBC
HCT: 51.9 % (ref 39.0–52.0)
Hemoglobin: 18 g/dL — ABNORMAL HIGH (ref 13.0–17.0)
MCH: 30.6 pg (ref 26.0–34.0)
MCHC: 34.7 g/dL (ref 30.0–36.0)
MCV: 88.3 fL (ref 80.0–100.0)
Platelets: 115 K/uL — ABNORMAL LOW (ref 150–400)
RBC: 5.88 MIL/uL — ABNORMAL HIGH (ref 4.22–5.81)
RDW: 13.4 % (ref 11.5–15.5)
WBC: 12.7 K/uL — ABNORMAL HIGH (ref 4.0–10.5)
nRBC: 0 % (ref 0.0–0.2)

## 2020-12-28 LAB — ECHOCARDIOGRAM COMPLETE
Area-P 1/2: 4.2 cm2
Calc EF: 45.3 %
S' Lateral: 3.8 cm
Single Plane A2C EF: 37.9 %
Single Plane A4C EF: 49.4 %
Weight: 3795.44 [oz_av]

## 2020-12-28 LAB — BASIC METABOLIC PANEL
Anion gap: 10 (ref 5–15)
BUN: 22 mg/dL (ref 8–23)
CO2: 25 mmol/L (ref 22–32)
Calcium: 8.8 mg/dL — ABNORMAL LOW (ref 8.9–10.3)
Chloride: 105 mmol/L (ref 98–111)
Creatinine, Ser: 1.49 mg/dL — ABNORMAL HIGH (ref 0.61–1.24)
GFR, Estimated: 49 mL/min — ABNORMAL LOW (ref >60)
Glucose, Bld: 146 mg/dL — ABNORMAL HIGH (ref 70–99)
Potassium: 3.8 mmol/L (ref 3.5–5.1)
Sodium: 140 mmol/L (ref 135–145)

## 2020-12-28 LAB — PHOSPHORUS: Phosphorus: 4.1 mg/dL (ref 2.5–4.6)

## 2020-12-28 LAB — MAGNESIUM: Magnesium: 2.4 mg/dL (ref 1.7–2.4)

## 2020-12-28 MED ORDER — INSULIN ASPART 100 UNIT/ML ~~LOC~~ SOLN
0.0000 [IU] | Freq: Three times a day (TID) | SUBCUTANEOUS | Status: DC
  Administered 2020-12-28: 4 [IU] via SUBCUTANEOUS
  Administered 2020-12-29 – 2020-12-31 (×4): 3 [IU] via SUBCUTANEOUS

## 2020-12-28 MED ORDER — PANTOPRAZOLE SODIUM 40 MG PO TBEC
40.0000 mg | DELAYED_RELEASE_TABLET | Freq: Every day | ORAL | Status: DC
  Administered 2020-12-28: 40 mg via ORAL
  Filled 2020-12-28: qty 1

## 2020-12-28 MED ORDER — ORAL CARE MOUTH RINSE
15.0000 mL | Freq: Two times a day (BID) | OROMUCOSAL | Status: DC
  Administered 2020-12-28 – 2020-12-30 (×4): 15 mL via OROMUCOSAL

## 2020-12-28 MED ORDER — HYDRALAZINE HCL 25 MG PO TABS: 25.0000 mg | ORAL_TABLET | Freq: Three times a day (TID) | ORAL | Status: DC | PRN

## 2020-12-28 MED ORDER — INSULIN ASPART 100 UNIT/ML ~~LOC~~ SOLN: 0.0000 [IU] | Freq: Every day | SUBCUTANEOUS | Status: DC

## 2020-12-28 MED ORDER — FUROSEMIDE 10 MG/ML IJ SOLN
40.0000 mg | Freq: Two times a day (BID) | INTRAMUSCULAR | Status: DC
  Administered 2020-12-28 – 2020-12-30 (×4): 40 mg via INTRAVENOUS
  Filled 2020-12-28 (×4): qty 4

## 2020-12-28 MED ORDER — SACUBITRIL-VALSARTAN 97-103 MG PO TABS
1.0000 | ORAL_TABLET | Freq: Two times a day (BID) | ORAL | Status: DC
  Filled 2020-12-28: qty 1

## 2020-12-28 MED ORDER — LEVOTHYROXINE SODIUM 50 MCG PO TABS
50.0000 ug | ORAL_TABLET | Freq: Every day | ORAL | Status: DC
  Administered 2020-12-28 – 2020-12-31 (×4): 50 ug via ORAL
  Filled 2020-12-28 (×4): qty 1

## 2020-12-28 MED ORDER — PERFLUTREN LIPID MICROSPHERE
1.0000 mL | INTRAVENOUS | Status: AC | PRN
  Administered 2020-12-28: 2 mL via INTRAVENOUS
  Filled 2020-12-28: qty 10

## 2020-12-28 MED ORDER — DABIGATRAN ETEXILATE MESYLATE 150 MG PO CAPS
150.0000 mg | ORAL_CAPSULE | Freq: Two times a day (BID) | ORAL | Status: DC
  Administered 2020-12-28 – 2020-12-31 (×6): 150 mg via ORAL
  Filled 2020-12-28 (×6): qty 1

## 2020-12-28 MED ORDER — ATORVASTATIN CALCIUM 10 MG PO TABS
20.0000 mg | ORAL_TABLET | Freq: Every day | ORAL | Status: DC
  Administered 2020-12-28 – 2020-12-31 (×4): 20 mg via ORAL
  Filled 2020-12-28 (×3): qty 2

## 2020-12-28 MED ORDER — SACUBITRIL-VALSARTAN 97-103 MG PO TABS
1.0000 | ORAL_TABLET | Freq: Two times a day (BID) | ORAL | Status: DC
  Administered 2020-12-28 – 2020-12-31 (×7): 1 via ORAL
  Filled 2020-12-28 (×7): qty 1

## 2020-12-28 MED ORDER — CARVEDILOL 25 MG PO TABS
25.0000 mg | ORAL_TABLET | Freq: Two times a day (BID) | ORAL | Status: DC
  Administered 2020-12-28 – 2020-12-31 (×6): 25 mg via ORAL
  Filled 2020-12-28 (×6): qty 1

## 2020-12-28 MED ORDER — ASPIRIN 81 MG PO CHEW
81.0000 mg | CHEWABLE_TABLET | Freq: Every day | ORAL | Status: DC
  Administered 2020-12-28: 81 mg via ORAL

## 2020-12-28 MED ORDER — CLOPIDOGREL BISULFATE 75 MG PO TABS
75.0000 mg | ORAL_TABLET | Freq: Every day | ORAL | Status: DC
  Administered 2020-12-28 – 2020-12-31 (×4): 75 mg via ORAL
  Filled 2020-12-28 (×4): qty 1

## 2020-12-28 MED ORDER — CHLORHEXIDINE GLUCONATE CLOTH 2 % EX PADS
6.0000 | MEDICATED_PAD | Freq: Every day | CUTANEOUS | Status: DC
  Administered 2020-12-27 – 2020-12-29 (×3): 6 via TOPICAL

## 2020-12-28 NOTE — Procedures (Signed)
Extubation Procedure Note  Patient Details:   Name: [name] DOB: [birth year] MRN: [id number]   Airway Documentation:    Vent end date: 12/28/20 Vent end time: 1048   Evaluation  O2 sats: stable throughout Complications: No apparent complications Patient did tolerate procedure well. Bilateral Breath Sounds: Clear   Yes   Patient extubated to 2L [place] per MD order.  Positive cuff leak noted.  No evidence of stridor.  Patient able to speak post extubation.  Incentive spirometry performed, with good effort, with achieved goal of 1750.  Sats and vitals are stable at this time.  No complications noted.  Will continue to monitor.   Judith Part 12/28/2020, 11:01 AM

## 2020-12-28 NOTE — Progress Notes (Signed)
[facility] for IV Heparin >> Dabigatran Indication: atrial fibrillation  Allergies  Allergen Reactions  . Clindamycin/Lincomycin     Developed C. difficile colitis 1 month after use    Patient Measurements: Weight: 107.6 kg (237 lb 3.4 oz) Heparin Dosing Weight: 111 kg   Vital Signs: Temp: 98.2 F (36.8 C) (03/21 1600) Temp Source: Axillary (03/21 1600) BP: 161/69 (03/21 1700) Pulse Rate: 81 (03/21 1700)  Labs: Recent Labs    12/27/20 1728 12/27/20 1732 12/27/20 1733 12/27/20 1754 12/27/20 2158 12/28/20 0540 12/28/20 1406  HGB 19.7* 19.0* 20.1* 18.7*  --  18.0*  --   HCT 58.0* 58.0* 59.0* 55.0*  --  51.9  --   PLT  --  153  --   --   --  115*  --   APTT  --  34  --   --   --   --   --   LABPROT  --  15.7*  --   --   --   --   --   INR  --  1.3*  --   --   --   --   --   HEPARINUNFRC  --   --   --   --   --  0.27* 0.28*  CREATININE 1.10 1.38*  --   --   --  1.49*  --   TROPONINIHS  --  27*  --   --  337*  --   --     Estimated Creatinine Clearance: 50.9 mL/min (A) (by C-G formula based on SCr of 1.49 mg/dL (H)).  Assessment: 75 yr old man was found unresponsive and intubated en route to hospital. Pt was on dabigatran (150 mg PO BID) at home for Afib. Pharmacy was consulted to resume anticoagulation with IV heparin. CT head was neg for bleed.   Pt had subtherapeutic heparin level (0.28 units/ml) earlier today; heparin infusion was increased to 1800 units/hr. H/H 18.0/51.9, plt 115; TBW CrCl 66 ml/min. Per RN, no issues with IV or bleeding observed.  Pharmacy is now consulted to transition pt from IV heparin back to dabigatran. Per [facility] anticoagulation protocol, will stop heparin infusion at same time dabigatran is resumed.  Goal of Therapy:  Heparin level 0.3-0.7 units/ml Monitor platelets by anticoagulation protocol: Yes   Plan:  Continue heparin infusion at 1800 units/hr until 2000 tonight; then, d/c heparin and  resume dabigatran 150 mg PO BID at 2000 tonight Monitor CBC Monitor for bleeding  Gillermina Hu, PharmD, BCPS, [facility] Clinical Pharmacist 12/28/2020, 6:07 PM

## 2020-12-28 NOTE — Consult Note (Addendum)
Cardiology Consultation:   Patient ID: [name]; [id number]; July 31, 1946   Admit date: 12/27/2020 Date of Consult: 12/28/2020  Primary Care Provider: Marin Olp, MD Primary Cardiologist: Sherren Mocha, MD  Primary Electrophysiologist:  Thompson Grayer, MD Advanced Heart Failure: Glori Bickers, MD   Patient Profile:   [name] is a 75 y.o. 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 IVUS showed under deployed stent and the stent was dilated to 3.25 mm with good result. Cath 2017 w/ patent LAD stent, severe ostial stenosis CFX not amenable to PCI, patent nondom RCA w/ collaterals to Diag, med rx including Plavix. EP saw, he did not want ICD now.    He is being seen today for the evaluation of CHF at the request of Dr Halford Chessman.  History of Present Illness:   [name] was admitted 03/20 in pulm edema after being off his medications for weeks. SBP 260 per family reports when EMS called.  He became unresponsive and was intubated. He is felt to have acute metabolic encephalopathy from hypoxic. He was diuresed and managed by CCM. He was extubated this pm.  He had Afib RVR and was started on IV Dilt. This is being tapered and d/c'd, resume Coreg in am.   [name] states that he is generally compliant with his medications, including Entresto, Jardiance, Coreg, Plavix and other medications.  However, he states he has been missing Lasix doses 2-3 times a week.  He states that when he has something to do where he will not have instant access to a bathroom, he will take the Lasix later.  Sometimes he takes it in the evening and takes only 1/2 tablet.  Sometimes he does not take it at all.  His weight has not gone up, in fact it has been decreasing.  He has modified his eating habits, and lost 88 pounds.   Point, he weighed 303 pounds, that was in 2016.  His weight has trended down since then, he has been below 250 pounds since September 2020.  He has not noticed any recent increase in his weight.  He has some mild chronic pedal edema that has not changed recently.  He denies orthopnea and PND.  He has significant dyspnea on exertion, but can take the trash can to his little shop and into the street without getting short of breath.  He thinks his DOE has increased a little recently, but feels his symptoms were fairly sudden in onset.  He admits that this has been a real wake-up call for him.  He promises not to miss any doses of Lasix ever again.  He never has palpitations and is not aware of the atrial fibrillation.   Past Medical History:  Diagnosis Date  . ARF (acute renal failure) ([place]) 04/15/2019  . Atherosclerotic heart disease of native coronary artery with other forms of angina pectoris ([place]) 04/26/2019  . Atrial fibrillation ([place])    initial diagnoses 2012  . B12 deficiency    per patient previously taking shots  . C. difficile colitis 04/17/2019  . C. difficile diarrhea   . CAD (coronary artery disease), native coronary artery 04/12/2007   History of MI. 2 stents. Plavix. Imdur 14m 10/28/15 cath Dr. CBurt Knack Findings: severe ostial LCx stenosis, patent LAD stent, patent dominant RCA supplies diagonal collateral.    .  Chronic combined systolic and diastolic CHF (congestive heart failure) ([place]) 04/15/2019  . CORONARY ARTERY DISEASE 04/12/2007   2 stents last in 2006.   Marland Kitchen Cystoid macular edema of right eye 01/16/2020  . Cystoid macular edema of right eye 01/16/2020  . DIABETES MELLITUS, TYPE II 04/16/2007  . Diverticulosis of colon (without mention of hemorrhage)   . GERD (gastroesophageal reflux disease) 08/09/2011  . HYPERLIPIDEMIA 04/16/2007   reveal study. not sure of medication  . Hyperlipidemia 04/16/2007   10/24 reveal study end. Atorvastatin 32m  . HYPERTENSION 04/12/2007  . HYPOTHYROIDISM  04/12/2007  . Hypothyroidism 04/12/2007   Synthroid 514m   . Ischemic cardiomyopathy    cath 35-40%  . Morbid obesity ([facility]10/30/2012  . MYOCARDIAL INFARCTION, HX OF 04/12/2007  . OBESITY 06/16/2009  . Obstructive sleep apnea 04/12/2007   02/2011 - AHI 96/h CPAP 13, Lg FF     . SLEEP APNEA 04/12/2007   CPAP  . Stroke ([facility]  . Type II diabetes mellitus with peripheral circulatory disorder ([facility]7/04/2007   Dr. GhCruzita Ledereranages. Uses fructosamine.    . Marland KitchenLCERATIVE COLITIS, LEFT SIDED 11/23/2010  . Ulcerative pancolitis without complication ([id number] Ulcerative Colitis. Mesalamine.   . Marland KitchenITAMIN B12 DEFICIENCY 11/29/2010    Past Surgical History:  Procedure Laterality Date  . CARDIAC CATHETERIZATION  10/2002   STENT. 2 stents Dr. BrMaurene Capes. CARDIAC CATHETERIZATION N/A 10/28/2015   Procedure: Right/Left Heart Cath and Coronary Angiography;  Surgeon: MiSherren MochaMD;  [facility];  Service: Cardiovascular;  Laterality: N/A;  . CATARACT EXTRACTION     2021 bilateral eyes   . CORONARY STENT PLACEMENT  2008   LAD   . RIGHT/LEFT HEART CATH AND CORONARY ANGIOGRAPHY N/A 06/01/2018   Procedure: RIGHT/LEFT HEART CATH AND CORONARY ANGIOGRAPHY;  Surgeon: CoSherren MochaMD;  [facility];  Service: Cardiovascular;  Laterality: N/A;  . TONSILLECTOMY       Prior to Admission medications   Medication Sig Start Date End Date Taking? Authorizing Provider  APRISO 0.375 g 24 hr capsule Take 4 capsules (1.5 g total) by mouth daily. 08/19/20  Yes HaErenest RasherPA-C  aspirin EC 81 MG tablet Take 81 mg by mouth daily. Swallow whole.   Yes [provider]  atorvastatin (LIPITOR) 20 MG tablet TAKE ONE TABLET (20MG TOTAL) BY MOUTH DAILY 02/24/20  Yes HuMarin OlpMD  carvedilol (COREG) 25 MG tablet TAKE ONE TABLET BY MOUTH TWICE A DAY 01/13/20  Yes Allred, JaJeneen RinksMD  clopidogrel (PLAVIX) 75 MG tablet TAKE ONE TABLET (75MG TOTAL) BY MOUTH DAILY 04/09/20  Yes HuMarin OlpMD  Coenzyme Q10 (COQ10 PO) Take 1 capsule by mouth daily with supper.   Yes [provider]  empagliflozin (JARDIANCE) 10 MG TABS tablet TAKE ONE (1) TABLET BY MOUTH EVERY DAY 11/06/19  Yes GhPhilemon KingdomMD  ENTRESTO 97-103 MG TAKE ONE TABLET BY MOUTH TWICE A DAY 11/18/20  Yes Bensimhon, DaShaune PascalMD  fish oil-omega-3 fatty acids 1000 MG capsule Take 1 g by mouth daily with supper.   Yes [provider]  furosemide (LASIX) 40 MG tablet TAKE ONE TABLET BY MOUTH EVERY DAY. 09/17/20  Yes Bensimhon, DaShaune PascalMD  glipiZIDE (GLUCOTROL) 10 MG tablet Take 1 tablet (10 mg total) by mouth 2 (two) times daily before a meal. 11/06/19  Yes GhPhilemon KingdomMD  isosorbide mononitrate (IMDUR) 60 MG 24 hr tablet TAKE ONE (1) TABLET  BY MOUTH EVERY DAY. Please make yearly appt with [name] for June 2022 for future refills. Thank you 1st attempt 12/17/20  Yes Sherren Mocha, MD  PRADAXA 150 MG CAPS capsule TAKE ONE (1) CAPSULE BY MOUTH TWICE A DAY. (EVERY 12 HOURS.) 09/17/20  Yes Sherren Mocha, MD  saccharomyces boulardii (FLORASTOR) 250 MG capsule Take 1 capsule (250 mg total) by mouth 2 (two) times daily. 04/22/19  Yes Ladene Artist, MD  sitaGLIPtin (JANUVIA) 100 MG tablet TAKE ONE (1) TABLET BY MOUTH EVERY DAY 11/02/20  Yes Philemon Kingdom, MD  spironolactone (ALDACTONE) 25 MG tablet TAKE ONE (1) TABLET BY MOUTH EVERY DAY 10/27/20  Yes Sherren Mocha, MD  SYNTHROID 50 MCG tablet TAKE ONE TABLET BY MOUTH DAILY BEFORE BREAKFAST 12/14/20  Yes Philemon Kingdom, MD  dicyclomine (BENTYL) 10 MG capsule TAKE ONE CAPSULE (10MG TOTAL) BY MOUTH TWO TIMES DAILY AS NEEDED FOR DIARRHEA OR ABDOMINAL CRAMPING 10/28/20   Mahala Menghini, PA-C  glucose blood test strip Use as instructed to test 4 time daily 07/03/19   Philemon Kingdom, MD    Inpatient Medications: Scheduled Meds: . aspirin  81 mg Oral Daily  . atorvastatin  20 mg Oral Daily  . Chlorhexidine Gluconate Cloth  6 each Topical  Daily  . clopidogrel  75 mg Oral Daily  . furosemide  40 mg Intravenous Daily  . insulin aspart  0-20 Units Subcutaneous TID WC  . insulin aspart  0-5 Units Subcutaneous QHS  . levothyroxine  50 mcg Oral Q0600  . mouth rinse  15 mL Mouth Rinse BID  . pantoprazole  40 mg Oral QHS  . sacubitril-valsartan  1 tablet Oral BID   Continuous Infusions: . sodium chloride Stopped (12/28/20 0703)  . heparin 1,800 Units/hr (12/28/20 1615)   PRN Meds: sodium chloride, acetaminophen, albuterol, docusate sodium, ondansetron (ZOFRAN) IV, polyethylene glycol  Allergies:    Allergies  Allergen Reactions  . Clindamycin/Lincomycin     Developed C. difficile colitis 1 month after use    Social History:   Social History   Socioeconomic History  . Marital status: Married    Spouse name: Not on file  . Number of children: 1  . Years of education: Not on file  . Highest education level: Not on file  Occupational History  . Occupation: RETIRED    Employer: RETIRED  Tobacco Use  . Smoking status: Former Smoker    Packs/day: 1.00    Years: 5.00    Pack years: 5.00    Types: Cigarettes    Quit date: 04/13/1966    Years since quitting: 54.7  . Smokeless tobacco: Never Used  Vaping Use  . Vaping Use: Never used  Substance and Sexual Activity  . Alcohol use: No    Alcohol/week: 0.0 standard drinks    Comment: no more beer  . Drug use: No  . Sexual activity: Not on file  Other Topics Concern  . Not on file  Social History Narrative   Cardiorehab 3 days a week. 45 minutes to an hour-stationary bike.    GRANDDAUGHTER (MS. 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       Married for 37 years in 2015, married previously for 14 years. Daughter with first wife and 3 grandkids.    Lives alone with wife. Get to see grandkids a lot. New grandchild in middle of  2015      Hobbies-yardwork, previously  liked to hunt and fish, does some target shooting   Social Determinants of Health   Financial Resource Strain: Low Risk   . Difficulty of Paying Living Expenses: Not hard at all  Food Insecurity: No Food Insecurity  . Worried About Charity fundraiser in the Last Year: Never true  . Ran Out of Food in the Last Year: Never true  Transportation Needs: No Transportation Needs  . Lack of Transportation (Medical): No  . Lack of Transportation (Non-Medical): No  Physical Activity: Inactive  . Days of Exercise per Week: 0 days  . Minutes of Exercise per Session: 0 min  Stress: No Stress Concern Present  . Feeling of Stress : Not at all  Social Connections: Moderately Integrated  . Frequency of Communication with Friends and Family: More than three times a week  . Frequency of Social Gatherings with Friends and Family: More than three times a week  . Attends Religious Services: More than 4 times per year  . Active Member of Clubs or Organizations: No  . Attends Archivist Meetings: Never  . Marital Status: Married  Human resources officer Violence: Not At Risk  . Fear of Current or Ex-Partner: No  . Emotionally Abused: No  . Physically Abused: No  . Sexually Abused: No    Family History:   Family History  Problem Relation Age of Onset  . Heart attack Mother        mid 3s  . Heart disease Father        H/O CAD, CABG, VALVE SURGERY  . Hypertension Brother   . Obesity Brother   . Stomach cancer Maternal Aunt   . Stomach cancer Paternal Grandmother        ? colon   . Colon cancer Neg Hx    Family Status:  Family Status  Relation Name Status  . Mother  Deceased  . Father  Deceased  . Brother  (Not Specified)  . Mat Aunt  (Not Specified)  . PGM  Deceased  . MGM  Deceased  . MGF  Deceased  . PGF  Deceased  . Neg Hx  (Not Specified)    ROS:  Please see the history of present illness.  All other ROS reviewed and negative.     Physical Exam/Data:   Vitals:    12/28/20 1430 12/28/20 1500 12/28/20 1530 12/28/20 1600  BP:    (!) 159/84  Pulse: 81 84 88 96  Resp: (!) 21 (!) 23 (!) 25 (!) 24  Temp:      TempSrc:      SpO2: 97% 97% 96% 97%  Weight:        Intake/Output Summary (Last 24 hours) at 12/28/2020 1731 Last data filed at 12/28/2020 1600 Gross per 24 hour  Intake 968.47 ml  Output 1495 ml  Net -526.53 ml    Last 3 Weights 12/28/2020 12/27/2020 11/06/2020  Weight (lbs) 237 lb 3.4 oz 242 lb 8.1 oz 246 lb 3.2 oz  Weight (kg) 107.6 kg 110 kg 111.676 kg     Body mass index is 37.15 kg/m.   General:  Well nourished, well developed, male in no acute distress HEENT: normal Lymph: no adenopathy Neck: JVD -10 cm Endocrine:  No thryomegaly Vascular: No carotid bruits; 4/4 extremity pulses 2+  Cardiac:  normal S1, S2; irregular rate and rhythm; no murmur Lungs: Rales bases bilaterally, no wheezing, rhonchi  Abd: soft, nontender, no hepatomegaly  Ext: Trace pedal edema Musculoskeletal:  No deformities, BUE and BLE strength normal and equal Skin: warm and dry  Neuro:  CNs 2-12 intact, no focal abnormalities noted Psych:  Normal affect   EKG:  The EKG was personally reviewed and demonstrates: Atrial fibrillation, RVR, heart rate 147, QRS duration 131 ms, previous QRS duration 124 ms Telemetry:  Telemetry was personally reviewed and demonstrates: Atrial fibrillation, heart rate control has improved overnight   CV studies:   ECHO: 12/29/2020 1. Poor acoustic windows limit study, even with the use of Definity.  Overall LVEF is probably low normal with severe hypokinesis of the  anteroseptal wall. Compared to echo from 04/29/18, LVEF is improved. . Left  ventricular ejection fraction, by  estimation, is 50 to 55%. The left ventricle has low normal function.  There is mild left ventricular hypertrophy. Left ventricular diastolic  parameters are indeterminate.  2. Right ventricular systolic function is normal. The right ventricular  size  is normal.  3. Left atrial size was mildly dilated.  4. The mitral valve is normal in structure. No evidence of mitral valve  regurgitation.  5. The aortic valve is abnormal. Aortic valve regurgitation is not  visualized. Mild to moderate aortic valve sclerosis/calcification is  present, without any evidence of aortic stenosis.  6. The inferior vena cava is normal in size with <50% respiratory  variability, suggesting right atrial pressure of 8 mmHg.   Echo 09/2018 - Left ventricle: The cavity size was normal. There was mild  concentric hypertrophy. Systolic function was severely reduced.  The estimated ejection fraction was in the range of 25% to 30%.  Wall motion was normal; there were no regional wall motion  abnormalities. Doppler parameters are consistent with restrictive  physiology, indicative of decreased left ventricular diastolic  compliance and/or increased left atrial pressure. Doppler  parameters are consistent with elevated ventricular end-diastolic  filling pressure.  - Ventricular septum: Septal motion showed paradox.  - Aortic valve: Valve area (VTI): 2.87 cm^2. Valve area (Vmax):  2.61 cm^2. Valve area (Vmean): 2.71 cm^2.  - Mitral valve: There was mild regurgitation.  - Left atrium: The atrium was severely dilated.  - Right ventricle: The cavity size was normal. Wall thickness was  normal. Systolic function was normal.  - Right atrium: The atrium was normal in size.  - Tricuspid valve: There was no regurgitation.  - Pulmonic valve: There was no regurgitation.  - Pulmonary arteries: The main pulmonary artery was normal-sized.  Systolic pressure could not be accurately estimated.  - Inferior vena cava: The vessel was normal in size.  - Pericardium, extracardiac: There was no pericardial effusion.   Cardiac cath 2019   1st Diag lesion is 100% stenosed.  Ost Ramus lesion is 50% stenosed.  Ost Cx lesion is 80% stenosed.  Ost 2nd Mrg  to 2nd Mrg lesion is 95% stenosed.  Mid RCA lesion is 25% stenosed.  Mid LM to Dist LM lesion is 25% stenosed.  Ost LAD to Prox LAD lesion is 20% stenosed.  1. Widely patent left main, LAD, and left circumflex 2. Severe ostial circumflex and ostial OM lesions unchanged from the previous cath study in 2017 3. Slow flow in the first diagonal branch of the LAD, collateralized from the right PDA 4. Well compensated left and right heart hemodynamics with low cardiac output calculated by Fick, possibly related to the Fick calculation with the patient's morbid obesity Recommendations: Stable coronary anatomy, ongoing medical therapy for heart failure. Left circumflex/obtuse marginal stenoses  not approachable by PCI based on ostial lesion locations.   Laboratory Data:   Chemistry Recent Labs  Lab 12/27/20 1728 12/27/20 1732 12/27/20 1733 12/27/20 1754 12/28/20 0540  NA 138 134* 138 137 140  K 4.0 3.8 3.9 4.3 3.8  CL 103 100  --   --  105  CO2  --  19*  --   --  25  GLUCOSE 207* 209*  --   --  146*  BUN 20 16  --   --  22  CREATININE 1.10 1.38*  --   --  1.49*  CALCIUM  --  8.2*  --   --  8.8*  GFRNONAA  --  54*  --   --  49*  ANIONGAP  --  15  --   --  10    Lab Results  Component Value Date   ALT 16 12/27/2020   AST 34 12/27/2020   ALKPHOS 110 12/27/2020   BILITOT 1.4 (H) 12/27/2020   Hematology Recent Labs  Lab 12/27/20 1732 12/27/20 1733 12/27/20 1754 12/28/20 0540  WBC 14.9*  --   --  12.7*  RBC 6.18*  --   --  5.88*  HGB 19.0* 20.1* 18.7* 18.0*  HCT 58.0* 59.0* 55.0* 51.9  MCV 93.9  --   --  88.3  MCH 30.7  --   --  30.6  MCHC 32.8  --   --  34.7  RDW 13.4  --   --  13.4  PLT 153  --   --  115*   Cardiac Enzymes High Sensitivity Troponin:   Recent Labs  Lab 12/27/20 1732 12/27/20 2158  TROPONINIHS 27* 337*      BNP Recent Labs  Lab 12/27/20 1739  BNP 880.4*    DDimer No results for input(s): DDIMER in the last 168 hours. TSH:  Lab  Results  Component Value Date   TSH 2.036 12/27/2020   Lipids: Lab Results  Component Value Date   CHOL 84 02/24/2020   HDL 51.60 02/24/2020   LDLCALC 12 02/24/2020   LDLDIRECT 18.0 06/13/2017   TRIG 29 12/28/2020   CHOLHDL 2 02/24/2020   HgbA1c: Lab Results  Component Value Date   HGBA1C 5.9 (H) 12/27/2020   Magnesium:  Magnesium  Date Value Ref Range Status  12/28/2020 2.4 1.7 - 2.4 mg/dL Final    Comment:    Performed at [facility], [place] [address], [place], [place] [address]     Radiology/Studies:  CT Head Wo Contrast  Result Date: 12/27/2020 CLINICAL DATA:  Altered mental status. EXAM: CT HEAD WITHOUT CONTRAST TECHNIQUE: Contiguous axial images were obtained from the base of the skull through the vertex without intravenous contrast. COMPARISON:  10/28/2015 FINDINGS: Brain: There is atrophy and chronic small vessel disease changes. Old right basal ganglia lacunar infarct, stable. No acute intracranial abnormality. Specifically, no hemorrhage, hydrocephalus, mass lesion, acute infarction, or significant intracranial injury. Vascular: No hyperdense vessel or unexpected calcification. Skull: No acute calvarial abnormality. Sinuses/Orbits: No acute findings Other: None IMPRESSION: Atrophy, chronic microvascular disease. No acute intracranial abnormality. Electronically Signed   By: Rolm Baptise M.D.   On: 12/27/2020 19:03   DG Chest Port 1 View  Result Date: 12/28/2020 CLINICAL DATA:  Pulmonary edema EXAM: PORTABLE CHEST 1 VIEW COMPARISON:  Radiograph 12/27/2020 FINDINGS: Endotracheal tube tip terminates in the mid to upper trachea 6 cm from the carina. Transesophageal tube in place with side port just beyond the GE junction and tip  below the margins of imaging. Telemetry leads and external support devices overlie the chest. Low lung volumes with pulmonary vascular crowding. Indistinct hazy opacities in the lungs most pronounced in the the perihilar and mid to lower lung  regions with likely layering left effusion partially obscuring the left hemidiaphragm. No discernible right pleural effusion nor visible pneumothorax. Cardiomegaly is similar to prior with a calcified, tortuous aorta. No acute osseous or soft tissue abnormality. IMPRESSION: Persistently low lung volumes. Grossly stable hazy opacities in the lungs most pronounced in the perihilar and mid to lower lung regions, findings are most consistent with pulmonary edema with likely layering left effusion. Unchanged cardiomegaly.  Likely tortuous, calcified aorta. Electronically Signed   By: Lovena Le M.D.   On: 12/28/2020 06:32   DG Chest Portable 1 View  Result Date: 12/27/2020 CLINICAL DATA:  Intubation, repositioning of endotracheal tube. EXAM: PORTABLE CHEST 1 VIEW COMPARISON:  12/27/2020 at 5:44 p.m. FINDINGS: The endotracheal tube tip is 4 cm above the carina, satisfactorily position. Nasogastric tube terminates in the stomach body. Low lung volumes are present, causing crowding of the pulmonary vasculature. Mild enlargement of the cardiopericardial silhouette with indistinct hazy opacities in the lungs. Increasing bibasilar airspace opacities compared to previous. Overall the airspace opacities are greater on the right than the left may reflect asymmetric edema or pneumonia. Irregular contour of the aortic arch possibly due to adjacent airspace opacity, strictly speaking mediastinal hematoma or dissection cannot be excluded on radiography. IMPRESSION: 1. Endotracheal tube tip is 4 cm above the carina. Nasogastric tube terminates in the stomach body. 2. Increasing bibasilar airspace opacities, right greater than left, compatible with asymmetric edema or pneumonia. 3. Irregular contour of the aortic arch, strictly speaking mediastinal hematoma or dissection cannot be excluded on radiography. Electronically Signed   By: Van Clines M.D.   On: 12/27/2020 20:30   DG Chest Portable 1 View  Addendum Date:  12/27/2020   ADDENDUM REPORT: 12/27/2020 18:16 ADDENDUM: The original report was by Dr. Van Clines. The following addendum is by Dr. Van Clines: Critical Value/emergent results were called by telephone at the time of interpretation on 12/27/2020 at 6:08 pm to provider Dr. Blanchie Dessert , who verbally acknowledged these results. Electronically Signed   By: Van Clines M.D.   On: 12/27/2020 18:16   Result Date: 12/27/2020 CLINICAL DATA:  Respiratory failure.  Shortness of breath EXAM: PORTABLE CHEST 1 VIEW COMPARISON:  10/24/2015 FINDINGS: The endotracheal tube is just into the right mainstem bronchus near the carina. Retraction of 3 cm recommended. Defibrillator pads noted. Indistinct airspace opacities in the right lung and to a lesser extent in the left perihilar region potentially from asymmetric edema or pneumonia. No blunting of the costophrenic angles. IMPRESSION: 1. The endotracheal tube is just into the right mainstem bronchus near the carina (malpositioned). Retraction of 3 cm recommended. 2. Indistinct airspace opacities in the right lung and to a lesser extent in the left perihilar region, potentially from asymmetric edema or pneumonia. Radiology assistant personnel have been notified to put me in telephone contact with the referring physician or the referring physician's clinical representative in order to discuss these findings. Once this communication is established I will issue an addendum to this report for documentation purposes. Electronically Signed: By: Van Clines M.D. On: 12/27/2020 18:05   DG Abd Portable 1V  Result Date: 12/27/2020 CLINICAL DATA:  OG tube placement EXAM: PORTABLE ABDOMEN - 1 VIEW COMPARISON:  None. FINDINGS: OG tube tip is in the stomach.  Nonobstructive bowel gas pattern. IMPRESSION: OG tube tip in the stomach. Electronically Signed   By: Rolm Baptise M.D.   On: 12/27/2020 23:40   ECHOCARDIOGRAM COMPLETE  Result Date: 12/28/2020     ECHOCARDIOGRAM REPORT   Patient Name:   [name] Date of Exam: 12/28/2020 Medical Rec #:  [id number]       Height:       67.0 in Accession #:    [id number]      Weight:       237.2 lb Date of Birth:  1945/12/12       BSA:          2.174 m Patient Age:    12 years        BP:           172/92 mmHg Patient Gender: M               HR:           82 bpm. Exam Location:  Inpatient Procedure: 2D Echo, Cardiac Doppler, Color Doppler and Intracardiac            Opacification Agent Indications:    Congestive Heart Failure I50.9  History:        Patient has prior history of Echocardiogram examinations, most                 recent 09/12/2018. CHF, Arrythmias:Atrial Fibrillation and LBBB;                 Risk Factors:Hypertension and Diabetes. Chronic kidney disease.  Sonographer:    Darlina Sicilian RDCS Referring Phys: [phone] [place]  1. Poor acoustic windows limit study, even with the use of Definity. Overall LVEF is probably low normal with severe hypokinesis of the anteroseptal wall. Compared to echo from 04/29/18, LVEF is improved. . Left ventricular ejection fraction, by estimation, is 50 to 55%. The left ventricle has low normal function. There is mild left ventricular hypertrophy. Left ventricular diastolic parameters are indeterminate.  2. Right ventricular systolic function is normal. The right ventricular size is normal.  3. Left atrial size was mildly dilated.  4. The mitral valve is normal in structure. No evidence of mitral valve regurgitation.  5. The aortic valve is abnormal. Aortic valve regurgitation is not visualized. Mild to moderate aortic valve sclerosis/calcification is present, without any evidence of aortic stenosis.  6. The inferior vena cava is normal in size with <50% respiratory variability, suggesting right atrial pressure of 8 mmHg. FINDINGS  Left Ventricle: Poor acoustic windows limit study, even with the use of Definity. Overall LVEF is probably low normal with severe  hypokinesis of the anteroseptal wall. Compared to echo from 04/29/18, LVEF is improved. Left ventricular ejection fraction, by estimation, is 50 to 55%. The left ventricle has low normal function. Definity contrast agent was given IV to delineate the left ventricular endocardial borders. The left ventricular internal cavity size was normal in size. There is mild left ventricular hypertrophy. Left ventricular diastolic parameters are indeterminate. Right Ventricle: The right ventricular size is normal. Right vetricular wall thickness was not assessed. Right ventricular systolic function is normal. Left Atrium: Left atrial size was mildly dilated. Right Atrium: Right atrial size was normal in size. Pericardium: There is no evidence of pericardial effusion. Mitral Valve: The mitral valve is normal in structure. No evidence of mitral valve regurgitation. Tricuspid Valve: The tricuspid valve is normal in structure. Tricuspid valve regurgitation is not demonstrated. Aortic Valve:  The aortic valve is abnormal. Aortic valve regurgitation is not visualized. Mild to moderate aortic valve sclerosis/calcification is present, without any evidence of aortic stenosis. Pulmonic Valve: The pulmonic valve was grossly normal. Pulmonic valve regurgitation is not visualized. Aorta: The aortic root is normal in size and structure. Venous: The inferior vena cava is normal in size with less than 50% respiratory variability, suggesting right atrial pressure of 8 mmHg. IAS/Shunts: No atrial level shunt detected by color flow Doppler.  LEFT VENTRICLE PLAX 2D LVIDd:         4.60 cm LVIDs:         3.80 cm LV PW:         1.50 cm LV IVS:        1.40 cm LVOT diam:     2.00 cm LV SV:         46 LV SV Index:   21 LVOT Area:     3.14 cm  LV Volumes (MOD) LV vol d, MOD A2C: 70.8 ml LV vol d, MOD A4C: 86.1 ml LV vol s, MOD A2C: 44.0 ml LV vol s, MOD A4C: 43.6 ml LV SV MOD A2C:     26.8 ml LV SV MOD A4C:     86.1 ml LV SV MOD BP:      36.2 ml LEFT  ATRIUM             Index LA diam:        4.10 cm 1.89 cm/m LA Vol (A2C):   59.1 ml 27.18 ml/m LA Vol (A4C):   76.0 ml 34.95 ml/m LA Biplane Vol: 71.5 ml 32.88 ml/m  AORTIC VALVE LVOT Vmax:   94.00 cm/s LVOT Vmean:  61.600 cm/s LVOT VTI:    0.147 m  AORTA Ao Root diam: 3.30 cm MITRAL VALVE MV Area (PHT): 4.20 cm     SHUNTS MV Decel Time: 181 msec     Systemic VTI:  0.15 m MV E velocity: 105.67 cm/s  Systemic Diam: 2.00 cm Dorris Carnes MD Electronically signed by Dorris Carnes MD Signature Date/Time: 12/28/2020/1:36:43 PM    Final     Assessment and Plan:   1.  Acute on chronic combined systolic and diastolic CHF: -He lost 5 pounds overnight, his weight went from 242 lbs->237 lbs  -Would increase the Lasix from 40 mg IV daily to twice daily -Follow intake/output, renal function and daily weights. -Contact CHF service in a.m. to see if they would like to consult on this patient and follow him  2.  Permanent atrial fibrillation -He was on Pradaxa prior to admission.  This was on hold and he is now on heparin -Carvedilol 25 mg twice daily was held -He got IV diltiazem initially to help with rate control because he his heart rate was significantly elevated on admission. -However, that was weaned off and has been discontinued. -Carvedilol 25 mg twice daily has been restarted -Will request pharmacy to transition him from heparin back onto his Pradaxa.  Otherwise, per CCM Active Problems:   Acute on chronic combined systolic and diastolic heart failure (HCC)   Acute on chronic respiratory failure with hypoxia ([place])     For questions or updates, please contact [place] Please consult [email or web address] for contact info under Cardiology/STEMI.   Signed, Rosaria Ferries, PA-C  12/28/2020 5:31 PM  Personally seen and examined. Agree with above.   75 year old male with previous EF 25 to 30%, current EF 50% on goal-directed medical therapy for heart failure here with  flash pulmonary edema, severe  hypertension after being off his medication.  He was intubated.  Diuresed.  Recently extubated.  He states that he is generally compliant with Entresto Jardiance Coreg Pradaxa but misses his Lasix dose a couple times a week.  Is hard for him to drive without having to go to the bathroom.  Currently he is feeling more comfortable.  Getting IV Lasix.  Did have atrial fibrillation with rapid ventricular response.  A. fib is not new for him.  On exam, alert and oriented, irregularly irregular rate less than 100 on telemetry, minimal pedal edema, crackles heard at bases.  Assessment and plan:  Acute on chronic combined systolic and diastolic heart failure -Continue with diuresis in fact we will give 40 mg IV twice daily of Lasix. -Continue with goal-directed medical therapy otherwise.  Permanent atrial fibrillation -We will go ahead and give him carvedilol 25 mg twice a day which was previously held. -We will stop his heparin IV and start him back on his Pradaxa which he was taking prior to his admission. -According to home medications he was taking aspirin 81, Plavix 75 as well as Pradaxa 150 twice a day.  I think would be wise to stop aspirin 81 and to continue with Plavix and Pradaxa to reduce bleeding risks.  Candee Furbish, MD

## 2020-12-28 NOTE — Progress Notes (Signed)
[facility] Progress Note Patient Name: [name] DOB: 1946-04-14 MRN: [id number]   Date of Service  12/28/2020  HPI/Events of Note  Notified of BP mildly elevated SBP 150s to 160s Patient seen awake with BP now at 130s. Carvedilol given > 1 hours earlier  eICU Interventions  Ordered hydralazine 25 PO prn for SBP > 170     Intervention Category Major Interventions: Hypertension - evaluation and management  Shona Needles Rashied Corallo 12/28/2020, 11:05 PM

## 2020-12-28 NOTE — Progress Notes (Signed)
  Echocardiogram 2D Echocardiogram with definity has been performed.  Darlina Sicilian M 12/28/2020, 10:11 AM

## 2020-12-28 NOTE — Progress Notes (Signed)
Brief Nutrition Note:  Pt intubated but awake and alert to nod/gesture during visit. Spoke with wife and daughter Loss adjuster, chartered).   They report, and pt endorses, that pt has lost around 80 lbs in the last 3 years per recommendation of his doctor to help with his heart failure. This weight loss has been intentional and family/pt has no concerns.   Family reports that pt eats healthfully. Steak, cheese, chicken, vegetables (onions, green peppers, carrots, green beans) pretty much on the daily. No changes in appetite per family and pt.   Nutrition Focused Physical Exam did not note anything remarkable.  Pt being extubated today. Per nurse, pt was intubated en route to hospital via EMS.  Spoke with nurse regarding medication noncompliance in H&P. Nurse reports that daughter found stash of pills that pt was not taking d/t stubbornness. Pt reports that he will be taking these medications from now on.  No nutrition intervention warranted at this time. If needed, RD can be consulted.  Derrel Nip, RD, LDN Registered Dietitian After Hours/Weekend Pager # in [place]

## 2020-12-28 NOTE — Progress Notes (Addendum)
NAME:  [name], MRN:  [id number], DOB:  1946/06/19, LOS: 1 ADMISSION DATE:  12/27/2020, CONSULTATION DATE:  12/27/20 REFERRING MD:  Maryan Rued- EM, CHIEF COMPLAINT:  Unresponsive / Respiratory Failure   History of Present Illness:  75 yo M PMH Afib (non-compliant with home anticoagulation), chronic combined systolic and diastolic HF (non-compliant with home meds), CAD, STEMI, TIAs, UC, DM, OSA on CPAP, Hypothyroidism who presented to ED 3/20 after becoming diaphoretic, SOB and shaky at home. Abrupt onset AMS associated with this. NIBP taken at home, reportedly SBP 200s per wife. Ems was dispatched and he was given nitro, after which time he became unresponsive and was intubated en route. Family recently identified that pt has been diverting home medications for the past few weeks-months.  In ED, CT H did not reveal evidence of acute CVA. pt initially in Afib but went into VT, maintaining pulse, and was given 166m amio, converting back into Afib. Started on dilt gtt.  Labs significant for Trop 27 BNP 880, LA 5, WBC 14 hgb 20, Cr 1.38   PCCM consulted for admission  Pertinent  Medical History  Afib TIA Systolic and diastolic HF UC DM  Hyopthyroidism CAD STEMI ICM OSA  Obesity Significant Hospital Events: Including procedures, antibiotic start and stop dates in addition to other pertinent events   . 3/20 admitted-- AMS + SOB, intubated by EMS. Started on rocephin and azithro in ED. Amio given for VT, then started on dilt gtt for Afib rvr.   Interim History / Subjective:  Afib with good rate control this AM off Cardizem. Pt is mentating well off Propofol - responding to yes/no questions, following commands. PSV this AM at 40% and 5 PEEP  Objective   Blood pressure 119/85, pulse 67, temperature (!) 97.2 F (36.2 C), temperature source Axillary, resp. rate 15, weight 107.6 kg, SpO2 100 %.    Vent Mode: PSV;CPAP FiO2 (%):  [40 %-100 %] 40 % Set Rate:  [18 bmp-20 bmp] 20 bmp Vt  Set:  [520 mL] 520 mL PEEP:  [5 cmH20] 5 cmH20 Pressure Support:  [5 cmH20] 5 cmH20 Plateau Pressure:  [16 cmH20-18 cmH20] 16 cmH20   Intake/Output Summary (Last 24 hours) at 12/28/2020 0920 Last data filed at 12/28/2020 0900 Gross per 24 hour  Intake 845.4 ml  Output 795 ml  Net 50.4 ml   Filed Weights   12/27/20 2200 12/28/20 0500  Weight: 110 kg 107.6 kg    Examination: General: Chronically ill appearing older male intubated, sitting up in bed, NAD Neuro: alert and awake, follows commands, interacts appropriately HENT: anicteric sclera, MMM, ETT in place Lungs: Symmetrical chest expansion, mechanically ventilated. Decreased breath sounds bilaterally Cardiovascular: regular rate, irregularly irregular rhythm, s1s2 Abdomen: Soft, non-tender, non-distended, NABS Extremities: no peripheral edema. 1+ bilateral dorsalis pedis pulses Skin: warm, well perfused, no rashes   Labs/imaging personally reviewed   3/20 CTH> no acute intracranial abnormality. 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   Resolved Hospital Problem list   Lactic Acidosis  Assessment & Plan:   Acute encephalopathy Hx TIA -CT head without acute process -mentating well this AM off propofol  Acute on chronic respiratory failure with hypoxia Pulmonary edema  OSA on CPAP (wife endorses compliance) -PSV this AM with minimal support required -plan to extubate this morning, will monitor respiratory status closely -lasix 496mQD  Afib RVR (acute RVR on chronic Afib) VTach, improved (after 15053mmio) -cardiology consulted for further recommendations on home medication management,  appreciate recs -s/p 2g calcium, 2g mag -heparin gtt per pharmacist consult -rate controlled overnight on dilt gtt -d/c diltiazem -restart Coreg  Acute on chronic Systolic and Diastolic HF CAD, s/p DES to LAD (2004) Hx STEMI (2008)  -last ECHO 2019 EF 25%, had been on entresto at home but has started  diverting home meds x weeks-months -start Entresto -start Coreg -lasix 40 mg QD -f/u TTE today -repeat troponin  HTN -restart Coreg  AKI, likely 2/2 cardiorenal syndrome -sCr 1.49 -cont acute HF management -careful with fluid administration  Hypothyroidism -TSH 2.036 -cont synthroid  Leukocytosis -likely reactive, no indication of CAP or other infectious cause -d/c abx   DM -SSI   UC -holding home mesalamine as cannot be given per tube  Best practice (evaluated daily)  Diet:  NPO Pain/Anxiety/Delirium protocol (if indicated): Yes (RASS goal 0) VAP protocol (if indicated): Yes DVT prophylaxis: Systemic AC GI prophylaxis: PPI Glucose control:  SSI Yes Central venous access:  N/A Arterial line:  N/A Foley:  N/A Mobility:  bed rest  PT consulted: N/A Last date of multidisciplinary goals of care discussion -- 3/20 Admitting CCM NP and MD discussed plan for admission and code status with family.  Code Status:  full code Disposition: Admit to ICU   Labs   CBC: Recent Labs  Lab 12/27/20 1728 12/27/20 1732 12/27/20 1733 12/27/20 1754 12/28/20 0540  WBC  --  14.9*  --   --  12.7*  HGB 19.7* 19.0* 20.1* 18.7* 18.0*  HCT 58.0* 58.0* 59.0* 55.0* 51.9  MCV  --  93.9  --   --  88.3  PLT  --  153  --   --  115*    Basic Metabolic Panel: Recent Labs  Lab 12/27/20 1728 12/27/20 1732 12/27/20 1733 12/27/20 1754 12/28/20 0540  NA 138 134* 138 137 140  K 4.0 3.8 3.9 4.3 3.8  CL 103 100  --   --  105  CO2  --  19*  --   --  25  GLUCOSE 207* 209*  --   --  146*  BUN 20 16  --   --  22  CREATININE 1.10 1.38*  --   --  1.49*  CALCIUM  --  8.2*  --   --  8.8*  MG  --   --   --   --  2.4  PHOS  --   --   --   --  4.1   GFR: Estimated Creatinine Clearance: 50.9 mL/min (A) (by C-G formula based on SCr of 1.49 mg/dL (H)). Recent Labs  Lab 12/27/20 1732 12/27/20 2158 12/28/20 0540  WBC 14.9*  --  12.7*  LATICACIDVEN 5.3* 1.6  --     Liver Function  Tests: Recent Labs  Lab 12/27/20 1732  AST 34  ALT 16  ALKPHOS 110  BILITOT 1.4*  PROT 7.1  ALBUMIN 4.0   No results for input(s): LIPASE, AMYLASE in the last 168 hours. No results for input(s): AMMONIA in the last 168 hours.  ABG    Component Value Date/Time   PHART 7.284 (L) 12/27/2020 1754   PCO2ART 45.0 12/27/2020 1754   PO2ART 107 12/27/2020 1754   HCO3 21.5 12/27/2020 1754   TCO2 23 12/27/2020 1754   ACIDBASEDEF 6.0 (H) 12/27/2020 1754   O2SAT 98.0 12/27/2020 1754     Coagulation Profile: Recent Labs  Lab 12/27/20 1732  INR 1.3*    Cardiac Enzymes: No results for input(s): CKTOTAL, CKMB, CKMBINDEX,  TROPONINI in the last 168 hours.  HbA1C: Hemoglobin A1C  Date/Time Value Ref Range Status  07/08/2020 11:35 AM 6.3 (A) 4.0 - 5.6 % Final   Hgb A1c MFr Bld  Date/Time Value Ref Range Status  12/27/2020 09:58 PM 5.9 (H) 4.8 - 5.6 % Final    Comment:    (NOTE) Pre diabetes:          5.7%-6.4%  Diabetes:              >6.4%  Glycemic control for   <7.0% adults with diabetes   02/24/2020 03:33 PM 6.8 (H) 4.6 - 6.5 % Final    Comment:    Glycemic Control Guidelines for People with Diabetes:Non Diabetic:  <6%Goal of Therapy: <7%Additional Action Suggested:  >8%     CBG: Recent Labs  Lab 12/27/20 2159 12/27/20 2334 12/28/20 0325 12/28/20 0746  GLUCAP 175* 175* 152* 139*    Review of Systems:   Unable to obtain, intubated and sedated   Past Medical History:  He,  has a past medical history of ARF (acute renal failure) ([place]) (04/15/2019), Atherosclerotic heart disease of native coronary artery with other forms of angina pectoris ([place]) (04/26/2019), Atrial fibrillation ([place]), B12 deficiency, C. difficile colitis (04/17/2019), C. difficile diarrhea, CAD (coronary artery disease), native coronary artery (04/12/2007), Chronic combined systolic and diastolic CHF (congestive heart failure) ([place]) (04/15/2019), CORONARY ARTERY DISEASE (04/12/2007), Cystoid macular edema of  right eye (01/16/2020), Cystoid macular edema of right eye (01/16/2020), DIABETES MELLITUS, TYPE II (04/16/2007), Diverticulosis of colon (without mention of hemorrhage), GERD (gastroesophageal reflux disease) (08/09/2011), HYPERLIPIDEMIA (04/16/2007), Hyperlipidemia (04/16/2007), HYPERTENSION (04/12/2007), HYPOTHYROIDISM (04/12/2007), Hypothyroidism (04/12/2007), Ischemic cardiomyopathy, Morbid obesity ([place]) (08/09/2011), MYOCARDIAL INFARCTION, HX OF (04/12/2007), OBESITY (06/16/2009), Obstructive sleep apnea (04/12/2007), SLEEP APNEA (04/12/2007), Stroke ([place]), Type II diabetes mellitus with peripheral circulatory disorder ([place]) (04/16/2007), ULCERATIVE COLITIS, LEFT SIDED (11/23/2010), Ulcerative pancolitis without complication ([place]) (29/51/8841), and VITAMIN B12 DEFICIENCY (11/29/2010).   Surgical History:   Past Surgical History:  Procedure Laterality Date  . CARDIAC CATHETERIZATION  10/2002   STENT. 2 stents Dr. Maurene Capes  . CARDIAC CATHETERIZATION N/A 10/28/2015   Procedure: Right/Left Heart Cath and Coronary Angiography;  Surgeon: Sherren Mocha, MD;  Location: [facility];  Service: Cardiovascular;  Laterality: N/A;  . CATARACT EXTRACTION     2021 bilateral eyes   . CORONARY STENT PLACEMENT  2008   LAD   . RIGHT/LEFT HEART CATH AND CORONARY ANGIOGRAPHY N/A 06/01/2018   Procedure: RIGHT/LEFT HEART CATH AND CORONARY ANGIOGRAPHY;  Surgeon: Sherren Mocha, MD;  Location: [facility];  Service: Cardiovascular;  Laterality: N/A;  . TONSILLECTOMY       Social History:   reports that he quit smoking about 54 years ago. His smoking use included cigarettes. He has a 5.00 pack-year smoking history. He has never used smokeless tobacco. He reports that he does not drink alcohol and does not use drugs.   Family History:  His family history includes Heart attack in his mother; Heart disease in his father; Hypertension in his brother; Obesity in his brother; Stomach cancer in his maternal aunt and paternal grandmother.  There is no history of Colon cancer.   Allergies Allergies  Allergen Reactions  . Clindamycin/Lincomycin     Developed C. difficile colitis 1 month after use     Home Medications  Prior to Admission medications   Medication Sig Start Date End Date Taking? Authorizing Provider  APRISO 0.375 g 24 hr capsule Take 4 capsules (1.5 g total)  by mouth daily. 08/19/20   Erenest Rasher, PA-C  atorvastatin (LIPITOR) 20 MG tablet TAKE ONE TABLET (20MG TOTAL) BY MOUTH DAILY 02/24/20   Marin Olp, MD  carvedilol (COREG) 25 MG tablet TAKE ONE TABLET BY MOUTH TWICE A DAY 01/13/20   Allred, Jeneen Rinks, MD  clopidogrel (PLAVIX) 75 MG tablet TAKE ONE TABLET (75MG TOTAL) BY MOUTH DAILY 04/09/20   Marin Olp, MD  Coenzyme Q10 (COQ10 PO) Take 1 capsule by mouth daily with supper.    [provider]  diclofenac Sodium (VOLTAREN) 1 % GEL Apply 1 application topically as needed (knee pain).     [provider]  dicyclomine (BENTYL) 10 MG capsule TAKE ONE CAPSULE (10MG TOTAL) BY MOUTH TWO TIMES DAILY AS NEEDED FOR DIARRHEA OR ABDOMINAL CRAMPING 10/28/20   Mahala Menghini, PA-C  empagliflozin (JARDIANCE) 10 MG TABS tablet TAKE ONE (1) TABLET BY MOUTH EVERY DAY 11/06/19   Philemon Kingdom, MD  ENTRESTO 97-103 MG TAKE ONE TABLET BY MOUTH TWICE A DAY 11/18/20   Bensimhon, Shaune Pascal, MD  fish oil-omega-3 fatty acids 1000 MG capsule Take 1 g by mouth daily with supper.    [provider]  furosemide (LASIX) 40 MG tablet TAKE ONE TABLET BY MOUTH EVERY DAY. 09/17/20   Bensimhon, Shaune Pascal, MD  glipiZIDE (GLUCOTROL) 10 MG tablet Take 1 tablet (10 mg total) by mouth 2 (two) times daily before a meal. 11/06/19   Philemon Kingdom, MD  glucosamine-chondroitin 500-400 MG tablet Take 1 tablet by mouth daily as needed (knee flare up or pain).    [provider]  glucose blood test strip Use as instructed to test 4 time daily 07/03/19   Philemon Kingdom, MD  isosorbide mononitrate (IMDUR) 60 MG  24 hr tablet TAKE ONE (1) TABLET BY MOUTH EVERY DAY. Please make yearly appt with [name] for June 2022 for future refills. Thank you 1st attempt 12/17/20   Sherren Mocha, MD  PRADAXA 150 MG CAPS capsule TAKE ONE (1) CAPSULE BY MOUTH TWICE A DAY. (EVERY 12 HOURS.) 09/17/20   Sherren Mocha, MD  saccharomyces boulardii (FLORASTOR) 250 MG capsule Take 1 capsule (250 mg total) by mouth 2 (two) times daily. 04/22/19   Ladene Artist, MD  sitaGLIPtin (JANUVIA) 100 MG tablet TAKE ONE (1) TABLET BY MOUTH EVERY DAY 11/02/20   Philemon Kingdom, MD  spironolactone (ALDACTONE) 25 MG tablet TAKE ONE (1) TABLET BY MOUTH EVERY DAY 10/27/20   Sherren Mocha, MD  SYNTHROID 50 MCG tablet TAKE ONE TABLET BY MOUTH DAILY BEFORE BREAKFAST 12/14/20   Philemon Kingdom, MD     Calvert Cantor, Medical Student 12/28/2020 , 9:20 AM

## 2020-12-28 NOTE — Progress Notes (Signed)
Pt placed on home cpap unit at this time.

## 2020-12-28 NOTE — Progress Notes (Signed)
[facility] for heparin Indication: atrial fibrillation  Allergies  Allergen Reactions  . Clindamycin/Lincomycin     Developed C. difficile colitis 1 month after use    Patient Measurements: Weight: 107.6 kg (237 lb 3.4 oz) Heparin Dosing Weight: 111 kg   Vital Signs: Temp: 97.2 F (36.2 C) (03/21 0700) Temp Source: Axillary (03/21 0700) BP: 132/110 (03/21 1200) Pulse Rate: 90 (03/21 1200)  Labs: Recent Labs    12/27/20 1728 12/27/20 1732 12/27/20 1733 12/27/20 1754 12/27/20 2158 12/28/20 0540 12/28/20 1406  HGB 19.7* 19.0* 20.1* 18.7*  --  18.0*  --   HCT 58.0* 58.0* 59.0* 55.0*  --  51.9  --   PLT  --  153  --   --   --  115*  --   APTT  --  34  --   --   --   --   --   LABPROT  --  15.7*  --   --   --   --   --   INR  --  1.3*  --   --   --   --   --   HEPARINUNFRC  --   --   --   --   --  0.27* 0.28*  CREATININE 1.10 1.38*  --   --   --  1.49*  --   TROPONINIHS  --  27*  --   --  337*  --   --     Estimated Creatinine Clearance: 50.9 mL/min (A) (by C-G formula based on SCr of 1.49 mg/dL (H)).    Assessment: 73 YOM who was found unresponsive and intubated en route. Pt was on dabigatran at home for Afib. Pharmacy consulted to resume anticoagulation with IV heparin. CT head neg for bleed.   Heparin level remains sub-therapeutic.  No issue with heparin infusion nor bleeding per RN.  Noted platelet count is trending down.  Goal of Therapy:  Heparin level 0.3-0.7 units/ml Monitor platelets by anticoagulation protocol: Yes   Plan:  Increase heparin gtt to 1800 units/hr Check 8 hr heparin level Daily heparin level and CBC  Baruc Tugwell D. Mina Marble, PharmD, BCPS, [place] 12/28/2020, 3:01 PM

## 2020-12-28 NOTE — Progress Notes (Signed)
ANTICOAGULATION CONSULT NOTE - Follow Up Consult  Pharmacy Consult for heparin Indication: atrial fibrillation  Labs: Recent Labs    12/27/20 1728 12/27/20 1732 12/27/20 1733 12/27/20 1754 12/27/20 2158 12/28/20 0540  HGB 19.7* 19.0* 20.1* 18.7*  --  18.0*  HCT 58.0* 58.0* 59.0* 55.0*  --  51.9  PLT  --  153  --   --   --  115*  APTT  --  34  --   --   --   --   LABPROT  --  15.7*  --   --   --   --   INR  --  1.3*  --   --   --   --   HEPARINUNFRC  --   --   --   --   --  0.27*  CREATININE 1.10 1.38*  --   --   --  1.49*  TROPONINIHS  --  27*  --   --  337*  --     Assessment: 74yo male slightly subtherapeutic on heparin with initial dosing while Pradaxa on hold; no gtt issues or signs of bleeding per RN.  Goal of Therapy:  Heparin level 0.3-0.7 units/ml   Plan:  Will increase heparin gtt by 1 unit/kg/hr to 1600 units/hr and check level in 6 hours.    Wynona Neat, PharmD, BCPS  12/28/2020,6:28 AM

## 2020-12-29 DIAGNOSIS — D751 Secondary polycythemia: Secondary | ICD-10-CM | POA: Diagnosis not present

## 2020-12-29 DIAGNOSIS — G4733 Obstructive sleep apnea (adult) (pediatric): Secondary | ICD-10-CM

## 2020-12-29 DIAGNOSIS — I5043 Acute on chronic combined systolic (congestive) and diastolic (congestive) heart failure: Secondary | ICD-10-CM | POA: Diagnosis not present

## 2020-12-29 DIAGNOSIS — J9621 Acute and chronic respiratory failure with hypoxia: Secondary | ICD-10-CM | POA: Diagnosis not present

## 2020-12-29 LAB — CBC
HCT: 48.7 % (ref 39.0–52.0)
Hemoglobin: 17.2 g/dL — ABNORMAL HIGH (ref 13.0–17.0)
MCH: 31 pg (ref 26.0–34.0)
MCHC: 35.3 g/dL (ref 30.0–36.0)
MCV: 87.7 fL (ref 80.0–100.0)
Platelets: 98 10*3/uL — ABNORMAL LOW (ref 150–400)
RBC: 5.55 MIL/uL (ref 4.22–5.81)
RDW: 13.6 % (ref 11.5–15.5)
WBC: 10.5 10*3/uL (ref 4.0–10.5)
nRBC: 0 % (ref 0.0–0.2)

## 2020-12-29 LAB — GLUCOSE, CAPILLARY
Glucose-Capillary: 121 mg/dL — ABNORMAL HIGH (ref 70–99)
Glucose-Capillary: 139 mg/dL — ABNORMAL HIGH (ref 70–99)
Glucose-Capillary: 100 mg/dL — ABNORMAL HIGH (ref 70–99)
Glucose-Capillary: 146 mg/dL — ABNORMAL HIGH (ref 70–99)
Glucose-Capillary: 144 mg/dL — ABNORMAL HIGH (ref 70–99)
Glucose-Capillary: 133 mg/dL — ABNORMAL HIGH (ref 70–99)

## 2020-12-29 LAB — BASIC METABOLIC PANEL
Anion gap: 10 (ref 5–15)
BUN: 26 mg/dL — ABNORMAL HIGH (ref 8–23)
CO2: 23 mmol/L (ref 22–32)
Calcium: 8.6 mg/dL — ABNORMAL LOW (ref 8.9–10.3)
Chloride: 104 mmol/L (ref 98–111)
Creatinine, Ser: 1.41 mg/dL — ABNORMAL HIGH (ref 0.61–1.24)
GFR, Estimated: 52 mL/min — ABNORMAL LOW (ref >60)
Glucose, Bld: 132 mg/dL — ABNORMAL HIGH (ref 70–99)
Potassium: 3.4 mmol/L — ABNORMAL LOW (ref 3.5–5.1)
Sodium: 137 mmol/L (ref 135–145)

## 2020-12-29 MED ORDER — POTASSIUM CHLORIDE CRYS ER 20 MEQ PO TBCR
40.0000 meq | EXTENDED_RELEASE_TABLET | Freq: Two times a day (BID) | ORAL | Status: DC
  Administered 2020-12-29 – 2020-12-30 (×2): 40 meq via ORAL
  Filled 2020-12-29 (×2): qty 2

## 2020-12-29 MED ORDER — POTASSIUM CHLORIDE CRYS ER 20 MEQ PO TBCR
40.0000 meq | EXTENDED_RELEASE_TABLET | Freq: Once | ORAL | Status: AC
  Administered 2020-12-29: 40 meq via ORAL
  Filled 2020-12-29: qty 2

## 2020-12-29 MED ORDER — SACCHAROMYCES BOULARDII 250 MG PO CAPS
250.0000 mg | ORAL_CAPSULE | Freq: Two times a day (BID) | ORAL | Status: DC
  Administered 2020-12-29 – 2020-12-31 (×4): 250 mg via ORAL
  Filled 2020-12-29 (×4): qty 1

## 2020-12-29 NOTE — Progress Notes (Signed)
[facility] ADULT ICU REPLACEMENT PROTOCOL   The patient does apply for the [facility] Adult ICU Electrolyte Replacment Protocol based on the criteria listed below:   1. Is GFR >/= 30 ml/min? Yes.    Patient's GFR today is 52 2. Is SCr </= 2? Yes.   Patient's SCr is 1.41 ml/kg/hr 3. Did SCr increase >/= 0.5 in 24 hours? No. 4. Abnormal electrolyte(s):k 3.4 5. Ordered repletion with: protocol 6. If a panic level lab has been reported, has the CCM MD in charge been notified? No..   Physician:    Ronda Fairly A 12/29/2020 4:14 AM

## 2020-12-29 NOTE — Progress Notes (Signed)
NAME:  [name], MRN:  [id number], DOB:  1946/03/21, LOS: 2 ADMISSION DATE:  12/27/2020, CONSULTATION DATE:  12/27/20 REFERRING MD:  Maryan Rued- EM, CHIEF COMPLAINT:  Unresponsive / Respiratory Failure   History of Present Illness:  75 yo M PMH Afib (non-compliant with home anticoagulation), chronic combined systolic and diastolic HF (non-compliant with home meds), CAD, STEMI, TIAs, UC, DM, OSA on CPAP, Hypothyroidism who presented to ED 3/20 after becoming diaphoretic, SOB and shaky at home. Abrupt onset AMS associated with this. NIBP taken at home, reportedly SBP 200s per wife. Ems was dispatched and he was given nitro, after which time he became unresponsive and was intubated en route. Family recently identified that pt has been diverting home medications for the past few weeks-months.  In ED, CT H did not reveal evidence of acute CVA. pt initially in Afib but went into VT, maintaining pulse, and was given 141m amio, converting back into Afib. Started on dilt gtt.  Labs significant for Trop 27 BNP 880, LA 5, WBC 14 hgb 20, Cr 1.38   PCCM consulted for admission  Pertinent  Medical History  Afib TIA Systolic and diastolic HF UC DM  Hyopthyroidism CAD STEMI ICM OSA  Obesity Significant Hospital Events: Including procedures, antibiotic start and stop dates in addition to other pertinent events   . 3/20 admitted-- AMS + SOB, intubated by EMS. Started on rocephin and azithro in ED. Amio given for VT, then started on dilt gtt for Afib rvr.   Interim History / Subjective:   Afib with good rate control this AM. Pt appears comfortable, able to converse in full sentences without chest pain, shortness of breath, or cough. UOP 2.9 L last 24 hours  Objective   Blood pressure (!) 138/91, pulse 82, temperature (!) 97 F (36.1 C), temperature source Axillary, resp. rate 14, weight 105.3 kg, SpO2 100 %.        Intake/Output Summary (Last 24 hours) at 12/29/2020 0831 Last data filed at  12/29/2020 0548 Gross per 24 hour  Intake 674.47 ml  Output 2610 ml  Net -1935.53 ml   Filed Weights   12/27/20 2200 12/28/20 0500 12/29/20 0500  Weight: 110 kg 107.6 kg 105.3 kg    Examination: General: Chronically ill appearing older male, sitting in NAD Neuro: alert and awake, interacts appropriately, able to move all extremities HENT: anicteric sclera, MMM Lungs: crackles right lung base, clear to auscultation all other fields, normal respiratory rate and effort, no accessory muscle use. Cardiovascular: regular rate, irregularly irregular rhythm, s1s2 Abdomen: Soft, non-tender, non-distended Extremities: trace peripheral edema, hyperpigmented macules on anterior shins consistent with chronic venous stasis Skin: warm, well perfused, no rashes   Labs/imaging personally reviewed   3/20 CTH> no acute intracranial abnormality. 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   Resolved Hospital Problem list   Lactic Acidosis Acute Encephalopathy Leukocytosis  Assessment & Plan:   Acute on chronic respiratory failure with hypoxia Pulmonary edema  OSA on CPAP (wife endorses compliance) -Extubated yesterday, tolerated well with no supplemental O2 demand or increased respiratory effort -CPAP overnight -lasix 462mQD  Afib RVR (acute RVR on chronic Afib) VTach, improved (after 15056mmio) -cardiology consulted for further recommendations on home medication management, appreciate recs -Coreg BID -start Pradaxa -start Plavix -d/c ASA -d/c heparin d/c  Acute on chronic Systolic and Diastolic HF CAD, s/p DES to LAD (2004) Hx STEMI (2008)  -last ECHO 2019 EF 25%, had been on entresto at home but  has started diverting home meds x weeks-months -cont Entresto -start Coreg -lasix 40 mg QD -f/u TTE today -repeat troponin  HTN -restart Coreg  AKI, likely 2/2 cardiorenal syndrome -sCr 1.49 -cont acute HF management -careful with fluid  administration  Polycythemia -likely secondary to chronic hypoxia -outpatient evaluation and OSA management  Thrombocytopenia - baseline ~150 -98 this AM - 4T score 2, low risk - heparin already d/c'd - f/u CBC  Hypothyroidism -TSH 2.036 -cont synthroid  DM -restart Jardiance QD -restart Glipizide BID -start linagliptin QD, sitagliptin not on formulary  UC -restart home mesalamine  Best practice (evaluated daily)  Diet:  NPO Pain/Anxiety/Delirium protocol (if indicated): Yes (RASS goal 0) VAP protocol (if indicated): Yes DVT prophylaxis: Systemic AC GI prophylaxis: PPI Glucose control:  SSI Yes Central venous access:  N/A Arterial line:  N/A Foley:  N/A Mobility:  bed rest  PT consulted: N/A Last date of multidisciplinary goals of care discussion -- 3/20 Admitting CCM NP and MD discussed plan for admission and code status with family.  Code Status:  full code Disposition: Admit to ICU   Labs   CBC: Recent Labs  Lab 12/27/20 1732 12/27/20 1733 12/27/20 1754 12/28/20 0540 12/29/20 0245  WBC 14.9*  --   --  12.7* 10.5  HGB 19.0* 20.1* 18.7* 18.0* 17.2*  HCT 58.0* 59.0* 55.0* 51.9 48.7  MCV 93.9  --   --  88.3 87.7  PLT 153  --   --  115* 98*    Basic Metabolic Panel: Recent Labs  Lab 12/27/20 1728 12/27/20 1732 12/27/20 1733 12/27/20 1754 12/28/20 0540 12/29/20 0245  NA 138 134* 138 137 140 137  K 4.0 3.8 3.9 4.3 3.8 3.4*  CL 103 100  --   --  105 104  CO2  --  19*  --   --  25 23  GLUCOSE 207* 209*  --   --  146* 132*  BUN 20 16  --   --  22 26*  CREATININE 1.10 1.38*  --   --  1.49* 1.41*  CALCIUM  --  8.2*  --   --  8.8* 8.6*  MG  --   --   --   --  2.4  --   PHOS  --   --   --   --  4.1  --    GFR: Estimated Creatinine Clearance: 53.2 mL/min (A) (by C-G formula based on SCr of 1.41 mg/dL (H)). Recent Labs  Lab 12/27/20 1732 12/27/20 2158 12/28/20 0540 12/29/20 0245  WBC 14.9*  --  12.7* 10.5  LATICACIDVEN 5.3* 1.6  --   --      Liver Function Tests: Recent Labs  Lab 12/27/20 1732  AST 34  ALT 16  ALKPHOS 110  BILITOT 1.4*  PROT 7.1  ALBUMIN 4.0   No results for input(s): LIPASE, AMYLASE in the last 168 hours. No results for input(s): AMMONIA in the last 168 hours.  ABG    Component Value Date/Time   PHART 7.284 (L) 12/27/2020 1754   PCO2ART 45.0 12/27/2020 1754   PO2ART 107 12/27/2020 1754   HCO3 21.5 12/27/2020 1754   TCO2 23 12/27/2020 1754   ACIDBASEDEF 6.0 (H) 12/27/2020 1754   O2SAT 98.0 12/27/2020 1754     Coagulation Profile: Recent Labs  Lab 12/27/20 1732  INR 1.3*    Cardiac Enzymes: No results for input(s): CKTOTAL, CKMB, CKMBINDEX, TROPONINI in the last 168 hours.  HbA1C: Hemoglobin A1C  Date/Time Value  Ref Range Status  07/08/2020 11:35 AM 6.3 (A) 4.0 - 5.6 % Final   Hgb A1c MFr Bld  Date/Time Value Ref Range Status  12/27/2020 09:58 PM 5.9 (H) 4.8 - 5.6 % Final    Comment:    (NOTE) Pre diabetes:          5.7%-6.4%  Diabetes:              >6.4%  Glycemic control for   <7.0% adults with diabetes   02/24/2020 03:33 PM 6.8 (H) 4.6 - 6.5 % Final    Comment:    Glycemic Control Guidelines for People with Diabetes:Non Diabetic:  <6%Goal of Therapy: <7%Additional Action Suggested:  >8%     CBG: Recent Labs  Lab 12/28/20 1944 12/28/20 2152 12/28/20 2353 12/29/20 0332 12/29/20 0732  GLUCAP 133* 103* 113* 121* 139*    Review of Systems:   Unable to obtain, intubated and sedated   Past Medical History:  He,  has a past medical history of ARF (acute renal failure) ([place]) (04/15/2019), Atherosclerotic heart disease of native coronary artery with other forms of angina pectoris ([place]) (04/26/2019), Atrial fibrillation ([place]), B12 deficiency, C. difficile colitis (04/17/2019), C. difficile diarrhea, CAD (coronary artery disease), native coronary artery (04/12/2007), Chronic combined systolic and diastolic CHF (congestive heart failure) ([place]) (04/15/2019), CORONARY ARTERY  DISEASE (04/12/2007), Cystoid macular edema of right eye (01/16/2020), Cystoid macular edema of right eye (01/16/2020), DIABETES MELLITUS, TYPE II (04/16/2007), Diverticulosis of colon (without mention of hemorrhage), GERD (gastroesophageal reflux disease) (08/09/2011), HYPERLIPIDEMIA (04/16/2007), Hyperlipidemia (04/16/2007), HYPERTENSION (04/12/2007), HYPOTHYROIDISM (04/12/2007), Hypothyroidism (04/12/2007), Ischemic cardiomyopathy, Morbid obesity ([place]) (08/09/2011), MYOCARDIAL INFARCTION, HX OF (04/12/2007), OBESITY (06/16/2009), Obstructive sleep apnea (04/12/2007), SLEEP APNEA (04/12/2007), Stroke ([place]), Type II diabetes mellitus with peripheral circulatory disorder ([place]) (04/16/2007), ULCERATIVE COLITIS, LEFT SIDED (11/23/2010), Ulcerative pancolitis without complication ([place]) (12/45/8099), and VITAMIN B12 DEFICIENCY (11/29/2010).   Surgical History:   Past Surgical History:  Procedure Laterality Date  . CARDIAC CATHETERIZATION  10/2002   STENT. 2 stents Dr. Maurene Capes  . CARDIAC CATHETERIZATION N/A 10/28/2015   Procedure: Right/Left Heart Cath and Coronary Angiography;  Surgeon: Sherren Mocha, MD;  Location: [facility];  Service: Cardiovascular;  Laterality: N/A;  . CATARACT EXTRACTION     2021 bilateral eyes   . CORONARY STENT PLACEMENT  2008   LAD   . RIGHT/LEFT HEART CATH AND CORONARY ANGIOGRAPHY N/A 06/01/2018   Procedure: RIGHT/LEFT HEART CATH AND CORONARY ANGIOGRAPHY;  Surgeon: Sherren Mocha, MD;  Location: [facility];  Service: Cardiovascular;  Laterality: N/A;  . TONSILLECTOMY       Social History:   reports that he quit smoking about 54 years ago. His smoking use included cigarettes. He has a 5.00 pack-year smoking history. He has never used smokeless tobacco. He reports that he does not drink alcohol and does not use drugs.   Family History:  His family history includes Heart attack in his mother; Heart disease in his father; Hypertension in his brother; Obesity in his brother; Stomach cancer in  his maternal aunt and paternal grandmother. There is no history of Colon cancer.   Allergies Allergies  Allergen Reactions  . Clindamycin/Lincomycin     Developed C. difficile colitis 1 month after use     Home Medications  Prior to Admission medications   Medication Sig Start Date End Date Taking? Authorizing Provider  APRISO 0.375 g 24 hr capsule Take 4 capsules (1.5 g total) by mouth daily. 08/19/20   Erenest Rasher, PA-C  atorvastatin (LIPITOR) 20 MG tablet TAKE ONE TABLET (20MG TOTAL) BY MOUTH DAILY 02/24/20   Marin Olp, MD  carvedilol (COREG) 25 MG tablet TAKE ONE TABLET BY MOUTH TWICE A DAY 01/13/20   Allred, Jeneen Rinks, MD  clopidogrel (PLAVIX) 75 MG tablet TAKE ONE TABLET (75MG TOTAL) BY MOUTH DAILY 04/09/20   Marin Olp, MD  Coenzyme Q10 (COQ10 PO) Take 1 capsule by mouth daily with supper.    [provider]  diclofenac Sodium (VOLTAREN) 1 % GEL Apply 1 application topically as needed (knee pain).     [provider]  dicyclomine (BENTYL) 10 MG capsule TAKE ONE CAPSULE (10MG TOTAL) BY MOUTH TWO TIMES DAILY AS NEEDED FOR DIARRHEA OR ABDOMINAL CRAMPING 10/28/20   Mahala Menghini, PA-C  empagliflozin (JARDIANCE) 10 MG TABS tablet TAKE ONE (1) TABLET BY MOUTH EVERY DAY 11/06/19   Philemon Kingdom, MD  ENTRESTO 97-103 MG TAKE ONE TABLET BY MOUTH TWICE A DAY 11/18/20   Bensimhon, Shaune Pascal, MD  fish oil-omega-3 fatty acids 1000 MG capsule Take 1 g by mouth daily with supper.    [provider]  furosemide (LASIX) 40 MG tablet TAKE ONE TABLET BY MOUTH EVERY DAY. 09/17/20   Bensimhon, Shaune Pascal, MD  glipiZIDE (GLUCOTROL) 10 MG tablet Take 1 tablet (10 mg total) by mouth 2 (two) times daily before a meal. 11/06/19   Philemon Kingdom, MD  glucosamine-chondroitin 500-400 MG tablet Take 1 tablet by mouth daily as needed (knee flare up or pain).    [provider]  glucose blood test strip Use as instructed to test 4 time daily 07/03/19   Philemon Kingdom,  MD  isosorbide mononitrate (IMDUR) 60 MG 24 hr tablet TAKE ONE (1) TABLET BY MOUTH EVERY DAY. Please make yearly appt with [name] for June 2022 for future refills. Thank you 1st attempt 12/17/20   Sherren Mocha, MD  PRADAXA 150 MG CAPS capsule TAKE ONE (1) CAPSULE BY MOUTH TWICE A DAY. (EVERY 12 HOURS.) 09/17/20   Sherren Mocha, MD  saccharomyces boulardii (FLORASTOR) 250 MG capsule Take 1 capsule (250 mg total) by mouth 2 (two) times daily. 04/22/19   Ladene Artist, MD  sitaGLIPtin (JANUVIA) 100 MG tablet TAKE ONE (1) TABLET BY MOUTH EVERY DAY 11/02/20   Philemon Kingdom, MD  spironolactone (ALDACTONE) 25 MG tablet TAKE ONE (1) TABLET BY MOUTH EVERY DAY 10/27/20   Sherren Mocha, MD  SYNTHROID 50 MCG tablet TAKE ONE TABLET BY MOUTH DAILY BEFORE BREAKFAST 12/14/20   Philemon Kingdom, MD     Calvert Cantor, Medical Student 12/29/2020 , 8:31 AM

## 2020-12-29 NOTE — Progress Notes (Signed)
Progress Note  Patient Name: [name] Date of Encounter: 12/29/2020  [facility] Cardiologist: Sherren Mocha, MD   Subjective   Feeling better, less short of breath.  Atrial fibrillation under good control.  No chest pain.  Inpatient Medications    Scheduled Meds: . atorvastatin  20 mg Oral Daily  . carvedilol  25 mg Oral BID  . Chlorhexidine Gluconate Cloth  6 each Topical Daily  . clopidogrel  75 mg Oral Daily  . dabigatran  150 mg Oral Q12H  . furosemide  40 mg Intravenous BID  . insulin aspart  0-20 Units Subcutaneous TID WC  . insulin aspart  0-5 Units Subcutaneous QHS  . levothyroxine  50 mcg Oral Q0600  . mouth rinse  15 mL Mouth Rinse BID  . sacubitril-valsartan  1 tablet Oral BID   Continuous Infusions: . sodium chloride Stopped (12/28/20 0703)   PRN Meds: sodium chloride, acetaminophen, albuterol, docusate sodium, hydrALAZINE, ondansetron (ZOFRAN) IV, polyethylene glycol   Vital Signs    Vitals:   12/29/20 0600 12/29/20 0605 12/29/20 0700 12/29/20 0900  BP:  (!) 151/106 (!) 138/91 132/88  Pulse: 78 80 82 80  Resp: 16 12 14  (!) 24  Temp:   (!) 97 F (36.1 C)   TempSrc:   Axillary   SpO2: 95% 97% 100% (!) 89%  Weight:        Intake/Output Summary (Last 24 hours) at 12/29/2020 1036 Last data filed at 12/29/2020 0548 Gross per 24 hour  Intake 632.41 ml  Output 2460 ml  Net -1827.59 ml   Last 3 Weights 12/29/2020 12/28/2020 12/27/2020  Weight (lbs) 232 lb 2.3 oz 237 lb 3.4 oz 242 lb 8.1 oz  Weight (kg) 105.3 kg 107.6 kg 110 kg      Telemetry    A. fib 90s to 110- Personally Reviewed  ECG    No new- Personally Reviewed  Physical Exam   GEN: No acute distress.   Neck: No JVD Cardiac:  Irregularly irregular, no murmurs, rubs, or gallops.  Respiratory: Clear to auscultation bilaterally. GI: Soft, nontender, non-distended  MS: No edema; No deformity. Neuro:  Nonfocal  Psych: Normal affect   Labs    High Sensitivity Troponin:    Recent Labs  Lab 12/27/20 1732 12/27/20 2158  TROPONINIHS 27* 337*      Chemistry Recent Labs  Lab 12/27/20 1732 12/27/20 1733 12/27/20 1754 12/28/20 0540 12/29/20 0245  NA 134*   < > 137 140 137  K 3.8   < > 4.3 3.8 3.4*  CL 100  --   --  105 104  CO2 19*  --   --  25 23  GLUCOSE 209*  --   --  146* 132*  BUN 16  --   --  22 26*  CREATININE 1.38*  --   --  1.49* 1.41*  CALCIUM 8.2*  --   --  8.8* 8.6*  PROT 7.1  --   --   --   --   ALBUMIN 4.0  --   --   --   --   AST 34  --   --   --   --   ALT 16  --   --   --   --   ALKPHOS 110  --   --   --   --   BILITOT 1.4*  --   --   --   --   GFRNONAA 54*  --   --  49* 52*  ANIONGAP 15  --   --  10 10   < > = values in this interval not displayed.     Hematology Recent Labs  Lab 12/27/20 1732 12/27/20 1733 12/27/20 1754 12/28/20 0540 12/29/20 0245  WBC 14.9*  --   --  12.7* 10.5  RBC 6.18*  --   --  5.88* 5.55  HGB 19.0*   < > 18.7* 18.0* 17.2*  HCT 58.0*   < > 55.0* 51.9 48.7  MCV 93.9  --   --  88.3 87.7  MCH 30.7  --   --  30.6 31.0  MCHC 32.8  --   --  34.7 35.3  RDW 13.4  --   --  13.4 13.6  PLT 153  --   --  115* 98*   < > = values in this interval not displayed.    BNP Recent Labs  Lab 12/27/20 1739  BNP 880.4*     DDimer No results for input(s): DDIMER in the last 168 hours.   Radiology    CT Head Wo Contrast  Result Date: 12/27/2020 CLINICAL DATA:  Altered mental status. EXAM: CT HEAD WITHOUT CONTRAST TECHNIQUE: Contiguous axial images were obtained from the base of the skull through the vertex without intravenous contrast. COMPARISON:  10/28/2015 FINDINGS: Brain: There is atrophy and chronic small vessel disease changes. Old right basal ganglia lacunar infarct, stable. No acute intracranial abnormality. Specifically, no hemorrhage, hydrocephalus, mass lesion, acute infarction, or significant intracranial injury. Vascular: No hyperdense vessel or unexpected calcification. Skull: No acute  calvarial abnormality. Sinuses/Orbits: No acute findings Other: None IMPRESSION: Atrophy, chronic microvascular disease. No acute intracranial abnormality. Electronically Signed   By: Rolm Baptise M.D.   On: 12/27/2020 19:03   DG Chest Port 1 View  Result Date: 12/28/2020 CLINICAL DATA:  Pulmonary edema EXAM: PORTABLE CHEST 1 VIEW COMPARISON:  Radiograph 12/27/2020 FINDINGS: Endotracheal tube tip terminates in the mid to upper trachea 6 cm from the carina. Transesophageal tube in place with side port just beyond the GE junction and tip below the margins of imaging. Telemetry leads and external support devices overlie the chest. Low lung volumes with pulmonary vascular crowding. Indistinct hazy opacities in the lungs most pronounced in the the perihilar and mid to lower lung regions with likely layering left effusion partially obscuring the left hemidiaphragm. No discernible right pleural effusion nor visible pneumothorax. Cardiomegaly is similar to prior with a calcified, tortuous aorta. No acute osseous or soft tissue abnormality. IMPRESSION: Persistently low lung volumes. Grossly stable hazy opacities in the lungs most pronounced in the perihilar and mid to lower lung regions, findings are most consistent with pulmonary edema with likely layering left effusion. Unchanged cardiomegaly.  Likely tortuous, calcified aorta. Electronically Signed   By: Lovena Le M.D.   On: 12/28/2020 06:32   DG Chest Portable 1 View  Result Date: 12/27/2020 CLINICAL DATA:  Intubation, repositioning of endotracheal tube. EXAM: PORTABLE CHEST 1 VIEW COMPARISON:  12/27/2020 at 5:44 p.m. FINDINGS: The endotracheal tube tip is 4 cm above the carina, satisfactorily position. Nasogastric tube terminates in the stomach body. Low lung volumes are present, causing crowding of the pulmonary vasculature. Mild enlargement of the cardiopericardial silhouette with indistinct hazy opacities in the lungs. Increasing bibasilar airspace  opacities compared to previous. Overall the airspace opacities are greater on the right than the left may reflect asymmetric edema or pneumonia. Irregular contour of the aortic arch possibly due to adjacent airspace opacity, strictly speaking  mediastinal hematoma or dissection cannot be excluded on radiography. IMPRESSION: 1. Endotracheal tube tip is 4 cm above the carina. Nasogastric tube terminates in the stomach body. 2. Increasing bibasilar airspace opacities, right greater than left, compatible with asymmetric edema or pneumonia. 3. Irregular contour of the aortic arch, strictly speaking mediastinal hematoma or dissection cannot be excluded on radiography. Electronically Signed   By: Van Clines M.D.   On: 12/27/2020 20:30   DG Chest Portable 1 View  Addendum Date: 12/27/2020   ADDENDUM REPORT: 12/27/2020 18:16 ADDENDUM: The original report was by Dr. Van Clines. The following addendum is by Dr. Van Clines: Critical Value/emergent results were called by telephone at the time of interpretation on 12/27/2020 at 6:08 pm to provider Dr. Blanchie Dessert , who verbally acknowledged these results. Electronically Signed   By: Van Clines M.D.   On: 12/27/2020 18:16   Result Date: 12/27/2020 CLINICAL DATA:  Respiratory failure.  Shortness of breath EXAM: PORTABLE CHEST 1 VIEW COMPARISON:  10/24/2015 FINDINGS: The endotracheal tube is just into the right mainstem bronchus near the carina. Retraction of 3 cm recommended. Defibrillator pads noted. Indistinct airspace opacities in the right lung and to a lesser extent in the left perihilar region potentially from asymmetric edema or pneumonia. No blunting of the costophrenic angles. IMPRESSION: 1. The endotracheal tube is just into the right mainstem bronchus near the carina (malpositioned). Retraction of 3 cm recommended. 2. Indistinct airspace opacities in the right lung and to a lesser extent in the left perihilar region, potentially  from asymmetric edema or pneumonia. Radiology assistant personnel have been notified to put me in telephone contact with the referring physician or the referring physician's clinical representative in order to discuss these findings. Once this communication is established I will issue an addendum to this report for documentation purposes. Electronically Signed: By: Van Clines M.D. On: 12/27/2020 18:05   DG Abd Portable 1V  Result Date: 12/27/2020 CLINICAL DATA:  OG tube placement EXAM: PORTABLE ABDOMEN - 1 VIEW COMPARISON:  None. FINDINGS: OG tube tip is in the stomach.  Nonobstructive bowel gas pattern. IMPRESSION: OG tube tip in the stomach. Electronically Signed   By: Rolm Baptise M.D.   On: 12/27/2020 23:40   ECHOCARDIOGRAM COMPLETE  Result Date: 12/28/2020    ECHOCARDIOGRAM REPORT   Patient Name:   [name] Date of Exam: 12/28/2020 Medical Rec #:  [id number]       Height:       67.0 in Accession #:    [id number]      Weight:       237.2 lb Date of Birth:  August 14, 1946       BSA:          2.174 m Patient Age:    23 years        BP:           172/92 mmHg Patient Gender: M               HR:           82 bpm. Exam Location:  Inpatient Procedure: 2D Echo, Cardiac Doppler, Color Doppler and Intracardiac            Opacification Agent Indications:    Congestive Heart Failure I50.9  History:        Patient has prior history of Echocardiogram examinations, most                 recent 09/12/2018. CHF, Arrythmias:Atrial Fibrillation  and LBBB;                 Risk Factors:Hypertension and Diabetes. Chronic kidney disease.  Sonographer:    Darlina Sicilian RDCS Referring Phys: [id number] [place]  1. Poor acoustic windows limit study, even with the use of Definity. Overall LVEF is probably low normal with severe hypokinesis of the anteroseptal wall. Compared to echo from 04/29/18, LVEF is improved. . Left ventricular ejection fraction, by estimation, is 50 to 55%. The left ventricle has low  normal function. There is mild left ventricular hypertrophy. Left ventricular diastolic parameters are indeterminate.  2. Right ventricular systolic function is normal. The right ventricular size is normal.  3. Left atrial size was mildly dilated.  4. The mitral valve is normal in structure. No evidence of mitral valve regurgitation.  5. The aortic valve is abnormal. Aortic valve regurgitation is not visualized. Mild to moderate aortic valve sclerosis/calcification is present, without any evidence of aortic stenosis.  6. The inferior vena cava is normal in size with <50% respiratory variability, suggesting right atrial pressure of 8 mmHg. FINDINGS  Left Ventricle: Poor acoustic windows limit study, even with the use of Definity. Overall LVEF is probably low normal with severe hypokinesis of the anteroseptal wall. Compared to echo from 04/29/18, LVEF is improved. Left ventricular ejection fraction, by estimation, is 50 to 55%. The left ventricle has low normal function. Definity contrast agent was given IV to delineate the left ventricular endocardial borders. The left ventricular internal cavity size was normal in size. There is mild left ventricular hypertrophy. Left ventricular diastolic parameters are indeterminate. Right Ventricle: The right ventricular size is normal. Right vetricular wall thickness was not assessed. Right ventricular systolic function is normal. Left Atrium: Left atrial size was mildly dilated. Right Atrium: Right atrial size was normal in size. Pericardium: There is no evidence of pericardial effusion. Mitral Valve: The mitral valve is normal in structure. No evidence of mitral valve regurgitation. Tricuspid Valve: The tricuspid valve is normal in structure. Tricuspid valve regurgitation is not demonstrated. Aortic Valve: The aortic valve is abnormal. Aortic valve regurgitation is not visualized. Mild to moderate aortic valve sclerosis/calcification is present, without any evidence of aortic  stenosis. Pulmonic Valve: The pulmonic valve was grossly normal. Pulmonic valve regurgitation is not visualized. Aorta: The aortic root is normal in size and structure. Venous: The inferior vena cava is normal in size with less than 50% respiratory variability, suggesting right atrial pressure of 8 mmHg. IAS/Shunts: No atrial level shunt detected by color flow Doppler.  LEFT VENTRICLE PLAX 2D LVIDd:         4.60 cm LVIDs:         3.80 cm LV PW:         1.50 cm LV IVS:        1.40 cm LVOT diam:     2.00 cm LV SV:         46 LV SV Index:   21 LVOT Area:     3.14 cm  LV Volumes (MOD) LV vol d, MOD A2C: 70.8 ml LV vol d, MOD A4C: 86.1 ml LV vol s, MOD A2C: 44.0 ml LV vol s, MOD A4C: 43.6 ml LV SV MOD A2C:     26.8 ml LV SV MOD A4C:     86.1 ml LV SV MOD BP:      36.2 ml LEFT ATRIUM             Index LA  diam:        4.10 cm 1.89 cm/m LA Vol (A2C):   59.1 ml 27.18 ml/m LA Vol (A4C):   76.0 ml 34.95 ml/m LA Biplane Vol: 71.5 ml 32.88 ml/m  AORTIC VALVE LVOT Vmax:   94.00 cm/s LVOT Vmean:  61.600 cm/s LVOT VTI:    0.147 m  AORTA Ao Root diam: 3.30 cm MITRAL VALVE MV Area (PHT): 4.20 cm     SHUNTS MV Decel Time: 181 msec     Systemic VTI:  0.15 m MV E velocity: 105.67 cm/s  Systemic Diam: 2.00 cm Dorris Carnes MD Electronically signed by Dorris Carnes MD Signature Date/Time: 12/28/2020/1:36:43 PM    Final     Cardiac Studies   EF 50% up from 35%  Patient Profile     75 y.o. male here with flash pulmonary edema in the setting of medication noncompliance and hypertensive emergency.  Developed acute respiratory failure.  EF 25 to 30% but on current echocardiogram 50%.  Assessment & Plan    Acute systolic heart failure Flash pulmonary edema Hypertensive emergency Acute respiratory failure Atrial fibrillation -We will continue with IV Lasix 40 mg twice a day. -On excellent goal-directed medical therapy otherwise. -Back on carvedilol 25 mg twice a day.  Atrial fibrillation under reasonable control  currently. -Creatinine 1.41.  BUN slightly increased at 26 -Replete potassium-ordered  Permanent atrial fibrillation -Back on Pradaxa -Plavix. -Stopped aspirin to reduce bleeding risk.  For questions or updates, please contact [place] Please consult [email or web address] for contact info under        Signed, Candee Furbish, MD  12/29/2020, 10:36 AM

## 2020-12-30 ENCOUNTER — Telehealth: Payer: Self-pay | Admitting: Critical Care Medicine

## 2020-12-30 ENCOUNTER — Encounter (HOSPITAL_COMMUNITY): Payer: Self-pay | Admitting: Pulmonary Disease

## 2020-12-30 ENCOUNTER — Other Ambulatory Visit: Payer: Self-pay

## 2020-12-30 DIAGNOSIS — Z9989 Dependence on other enabling machines and devices: Secondary | ICD-10-CM

## 2020-12-30 DIAGNOSIS — G4733 Obstructive sleep apnea (adult) (pediatric): Secondary | ICD-10-CM | POA: Diagnosis not present

## 2020-12-30 DIAGNOSIS — J9621 Acute and chronic respiratory failure with hypoxia: Secondary | ICD-10-CM | POA: Diagnosis not present

## 2020-12-30 LAB — CBC
HCT: 48.5 % (ref 39.0–52.0)
Hemoglobin: 17 g/dL (ref 13.0–17.0)
MCH: 31 pg (ref 26.0–34.0)
MCHC: 35.1 g/dL (ref 30.0–36.0)
MCV: 88.5 fL (ref 80.0–100.0)
Platelets: 111 10*3/uL — ABNORMAL LOW (ref 150–400)
RBC: 5.48 MIL/uL (ref 4.22–5.81)
RDW: 13.4 % (ref 11.5–15.5)
WBC: 7.7 10*3/uL (ref 4.0–10.5)
nRBC: 0 % (ref 0.0–0.2)

## 2020-12-30 LAB — GLUCOSE, CAPILLARY
Glucose-Capillary: 109 mg/dL — ABNORMAL HIGH (ref 70–99)
Glucose-Capillary: 121 mg/dL — ABNORMAL HIGH (ref 70–99)
Glucose-Capillary: 102 mg/dL — ABNORMAL HIGH (ref 70–99)
Glucose-Capillary: 185 mg/dL — ABNORMAL HIGH (ref 70–99)

## 2020-12-30 LAB — BASIC METABOLIC PANEL
Anion gap: 8 (ref 5–15)
BUN: 28 mg/dL — ABNORMAL HIGH (ref 8–23)
CO2: 26 mmol/L (ref 22–32)
Calcium: 8.4 mg/dL — ABNORMAL LOW (ref 8.9–10.3)
Chloride: 103 mmol/L (ref 98–111)
Creatinine, Ser: 1.24 mg/dL (ref 0.61–1.24)
GFR, Estimated: >60 mL/min (ref >60)
Glucose, Bld: 185 mg/dL — ABNORMAL HIGH (ref 70–99)
Potassium: 3.5 mmol/L (ref 3.5–5.1)
Sodium: 137 mmol/L (ref 135–145)

## 2020-12-30 MED ORDER — POTASSIUM CHLORIDE CRYS ER 20 MEQ PO TBCR
20.0000 meq | EXTENDED_RELEASE_TABLET | Freq: Two times a day (BID) | ORAL | Status: DC
  Administered 2020-12-30 – 2020-12-31 (×2): 20 meq via ORAL
  Filled 2020-12-30 (×2): qty 1

## 2020-12-30 MED ORDER — FUROSEMIDE 40 MG PO TABS
40.0000 mg | ORAL_TABLET | Freq: Two times a day (BID) | ORAL | Status: DC
  Administered 2020-12-30 – 2020-12-31 (×2): 40 mg via ORAL
  Filled 2020-12-30 (×2): qty 1

## 2020-12-30 MED ORDER — EMPAGLIFLOZIN 10 MG PO TABS
10.0000 mg | ORAL_TABLET | Freq: Every day | ORAL | Status: DC
  Administered 2020-12-30 – 2020-12-31 (×2): 10 mg via ORAL
  Filled 2020-12-30 (×2): qty 1

## 2020-12-30 MED ORDER — SPIRONOLACTONE 25 MG PO TABS
25.0000 mg | ORAL_TABLET | Freq: Every day | ORAL | Status: DC
  Administered 2020-12-30 – 2020-12-31 (×2): 25 mg via ORAL
  Filled 2020-12-30 (×2): qty 1

## 2020-12-30 NOTE — Progress Notes (Signed)
Progress Note  Patient Name: [name] Date of Encounter: 12/30/2020  [facility] Cardiologist: Sherren Mocha, MD   Subjective   Letter, no significant shortness of breath, no chest pain.  Inpatient Medications    Scheduled Meds:  atorvastatin  20 mg Oral Daily   carvedilol  25 mg Oral BID   Chlorhexidine Gluconate Cloth  6 each Topical Daily   clopidogrel  75 mg Oral Daily   dabigatran  150 mg Oral Q12H   furosemide  40 mg Intravenous BID   insulin aspart  0-20 Units Subcutaneous TID WC   insulin aspart  0-5 Units Subcutaneous QHS   levothyroxine  50 mcg Oral Q0600   mouth rinse  15 mL Mouth Rinse BID   potassium chloride  40 mEq Oral BID   saccharomyces boulardii  250 mg Oral BID   sacubitril-valsartan  1 tablet Oral BID   Continuous Infusions:  sodium chloride Stopped (12/28/20 0703)   PRN Meds: sodium chloride, acetaminophen, albuterol, docusate sodium, hydrALAZINE, ondansetron (ZOFRAN) IV, polyethylene glycol   Vital Signs    Vitals:   12/29/20 2326 12/30/20 0600 12/30/20 0727 12/30/20 0846  BP:  (!) 149/86 (!) 148/95 (!) 153/87  Pulse: 76  71 78  Resp:   18   Temp:  97.6 F (36.4 C) 97.7 F (36.5 C)   TempSrc:  Oral Oral   SpO2: 96% 98% 95% 95%  Weight: 103.7 kg 103.6 kg    Height: 5' 8"  (1.727 m)       Intake/Output Summary (Last 24 hours) at 12/30/2020 1111 Last data filed at 12/30/2020 0800 Gross per 24 hour  Intake 960 ml  Output 700 ml  Net 260 ml   Last 3 Weights 12/30/2020 12/29/2020 12/29/2020  Weight (lbs) 228 lb 8 oz 228 lb 11.2 oz 232 lb 2.3 oz  Weight (kg) 103.647 kg 103.738 kg 105.3 kg      Telemetry    Atrial fibrillation heart rate in the 90s- Personally Reviewed  ECG    No new- Personally Reviewed  Physical Exam   GEN: No acute distress.   Neck: No JVD Cardiac:  Irregularly irregular normal rate, no murmurs, rubs, or gallops.  Respiratory: Clear to auscultation bilaterally. GI: Soft, nontender,  non-distended  MS: No edema; No deformity. Neuro:  Nonfocal  Psych: Normal affect   Labs    High Sensitivity Troponin:   Recent Labs  Lab 12/27/20 1732 12/27/20 2158  TROPONINIHS 27* 337*      Chemistry Recent Labs  Lab 12/27/20 1732 12/27/20 1733 12/28/20 0540 12/29/20 0245 12/30/20 0245  NA 134*   < > 140 137 137  K 3.8   < > 3.8 3.4* 3.5  CL 100  --  105 104 103  CO2 19*  --  25 23 26   GLUCOSE 209*  --  146* 132* 185*  BUN 16  --  22 26* 28*  CREATININE 1.38*  --  1.49* 1.41* 1.24  CALCIUM 8.2*  --  8.8* 8.6* 8.4*  PROT 7.1  --   --   --   --   ALBUMIN 4.0  --   --   --   --   AST 34  --   --   --   --   ALT 16  --   --   --   --   ALKPHOS 110  --   --   --   --   BILITOT 1.4*  --   --   --   --  GFRNONAA 54*  --  49* 52* >60  ANIONGAP 15  --  10 10 8    < > = values in this interval not displayed.     Hematology Recent Labs  Lab 12/28/20 0540 12/29/20 0245 12/30/20 0245  WBC 12.7* 10.5 7.7  RBC 5.88* 5.55 5.48  HGB 18.0* 17.2* 17.0  HCT 51.9 48.7 48.5  MCV 88.3 87.7 88.5  MCH 30.6 31.0 31.0  MCHC 34.7 35.3 35.1  RDW 13.4 13.6 13.4  PLT 115* 98* 111*    BNP Recent Labs  Lab 12/27/20 1739  BNP 880.4*     DDimer No results for input(s): DDIMER in the last 168 hours.   Radiology    No results found.  Cardiac Studies   EF 25 to 30% previously, recent echo 50%  Patient Profile     75 y.o. male with flash pulmonary edema in the setting of medication noncompliance and hypertensive emergency.  Developed acute respiratory failure.  EF 25 to 30% but on current echocardiogram 50%.  Assessment & Plan    Acute systolic heart failure EF 25% previously, recent echo 50% Hypertensive emergency -On carvedilol 25 mg twice a day in addition to other goal-directed medical therapies.  Entresto for instance. -Starting him back on his Jardiance 10 mg as well. -Transitioning over to p.o. Lasix 40 twice daily. -Restarting spironolactone 25 mg a  day -Decreasing potassium to 20 mEq twice daily-need to monitor closely as outpatient -Continue to monitor basic metabolic profile as outpatient.  Atrial fibrillation permanent -Atrial fibrillation under reasonable rate control currently. -He is back on his Pradaxa. -He is also on Plavix given his concomitant coronary artery disease -I previously stopped aspirin to help reduce bleeding risk.  Coronary artery disease -He has severe ostial circumflex and ostial OM lesions unchanged from 2017 study on cardiac catheterization in 2019.  Medical management.  His left circumflex and obtuse marginal stenosis or not approachable by PCI based upon ostial lesion locations. -Thankfully is not having any current anginal symptoms.  Chronic kidney disease stage IIIa -Improvement in creatinine with diuresis.  Went from 1.49 down to 1.24. -transitioning to p.o. diuretic. Adding back spironolactone.  Consider discharge later today or tomorrow.  He has close follow-up with Dr. Haroldine Laws scheduled.   For questions or updates, please contact [place] Please consult [email or web address] for contact info under        Signed, Candee Furbish, MD  12/30/2020, 11:11 AM

## 2020-12-30 NOTE — Progress Notes (Signed)
Patient has home CPAP and mask at bedside.  Pt states he is able to apply when he is ready for bed.

## 2020-12-30 NOTE — Progress Notes (Signed)
NAME:  [name], MRN:  [id number], DOB:  [birth year], LOS: 3 ADMISSION DATE:  12/27/2020, CONSULTATION DATE:  12/27/20 REFERRING MD:  Maryan Rued- EM, CHIEF COMPLAINT:  Unresponsive / Respiratory Failure   History of Present Illness:  75 yo M PMH Afib (non-compliant with home anticoagulation), chronic combined systolic and diastolic HF (non-compliant with home meds), CAD, STEMI, TIAs, UC, DM, OSA on CPAP, Hypothyroidism who presented to ED 3/20 after becoming diaphoretic, SOB and shaky at home. Abrupt onset AMS associated with this. NIBP taken at home, reportedly SBP 200s per wife. Ems was dispatched and he was given nitro, after which time he became unresponsive and was intubated en route. Family recently identified that pt has been diverting home medications for the past few weeks-months.  In ED, CT H did not reveal evidence of acute CVA. pt initially in Afib but went into VT, maintaining pulse, and was given 154m amio, converting back into Afib. Started on dilt gtt.  Labs significant for Trop 27 BNP 880, LA 5, WBC 14 hgb 20, Cr 1.38   PCCM consulted for admission  Pertinent  Medical History  Afib TIA Systolic and diastolic HF UC DM  Hyopthyroidism CAD STEMI ICM OSA  Obesity Significant Hospital Events: Including procedures, antibiotic start and stop dates in addition to other pertinent events   . 3/20 admitted-- AMS + SOB, intubated by EMS. Started on rocephin and azithro in ED. Amio given for VT, then started on dilt gtt for Afib rvr.  . 3/22 transfer out of ICU  Interim History / Subjective:  Mr. DSaeternfeels like he is back to his baseline. He denies SOB today.  Objective   Blood pressure (!) 153/87, pulse 78, temperature 97.7 F (36.5 C), temperature source Oral, resp. rate 18, height 5' 8"  (1.727 m), weight 103.6 kg, SpO2 95 %.        Intake/Output Summary (Last 24 hours) at 12/30/2020 0915 Last data filed at 12/30/2020 0800 Gross per 24 hour  Intake 960 ml  Output  1150 ml  Net -190 ml   Filed Weights   12/29/20 0500 12/29/20 2326 12/30/20 0600  Weight: 105.3 kg 103.7 kg 103.6 kg    Examination: General: elderly man laying in bed in NAD Neuro: awake and alert, answering questions appropriately, moving all extremities spontaneously HENT: [place]/AT, eyes anicteric Lungs: crackles right lung base, clear to auscultation all other fields, normal respiratory rate and effort, no accessory muscle use. On RA. Cardiovascular: S1S2, regular rate, irreg rhythm Abdomen: obese, soft, NT Extremities:  No clubbing or cyanosis Skin: warm, dry  Labs/imaging personally reviewed   3/20 CTH> no acute intracranial abnormality. 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   Resolved Hospital Problem list   Lactic Acidosis Acute Encephalopathy Leukocytosis  Assessment & Plan:   Acute on chronic respiratory failure with hypoxia   Acute pulmonary edema due to hypertensive emergency OSA on CPAP (wife endorses compliance) -weaned off O2 -use PTA CPAP overnight and PRN with naps -diuresis per primary  Afib , now rate controlled; previously in RVR  VTach in ICU Acute on chronic HFrEF due to ICM CAD s/p DES to LAD in 2004, STEMI in 2008 (unknown intervention) HTN -Con't management per cardiologiy-Coreg, EDelene Loll aspirin, plavix, pradaxa  Polycythemia -likely secondary to chronic hypoxia -outpatient evaluation requested  -Con't CPAP when sleeping   PCCM will sign off. Please call with questions.  Best practice (evaluated daily)  Per primary  Labs   CBC: Recent Labs  Lab 12/27/20 1732 12/27/20 1733 12/27/20 1754 12/28/20 0540 12/29/20 0245 12/30/20 0245  WBC 14.9*  --   --  12.7* 10.5 7.7  HGB 19.0* 20.1* 18.7* 18.0* 17.2* 17.0  HCT 58.0* 59.0* 55.0* 51.9 48.7 48.5  MCV 93.9  --   --  88.3 87.7 88.5  PLT 153  --   --  115* 98* 111*    Basic Metabolic Panel: Recent Labs  Lab 12/27/20 1728 12/27/20 1732 12/27/20 1733  12/27/20 1754 12/28/20 0540 12/29/20 0245 12/30/20 0245  NA 138 134* 138 137 140 137 137  K 4.0 3.8 3.9 4.3 3.8 3.4* 3.5  CL 103 100  --   --  105 104 103  CO2  --  19*  --   --  25 23 26   GLUCOSE 207* 209*  --   --  146* 132* 185*  BUN 20 16  --   --  22 26* 28*  CREATININE 1.10 1.38*  --   --  1.49* 1.41* 1.24  CALCIUM  --  8.2*  --   --  8.8* 8.6* 8.4*  MG  --   --   --   --  2.4  --   --   PHOS  --   --   --   --  4.1  --   --      Julian Hy, DO 12/30/20 1:37 PM [facility]  From 7AM- 7PM if no response to pager, please call [phone]. After hours, 7PM- 7AM, please call [place]  [phone].

## 2020-12-30 NOTE — Telephone Encounter (Signed)
Patient needs an appointment to establish care with Dr. Elsworth Soho or Halford Chessman in [place] in 2-4 weeks. Thanks!  Julian Hy, DO 12/30/20 1:14 PM [facility]

## 2020-12-30 NOTE — Progress Notes (Addendum)
PROGRESS NOTE    [name]  ZOX:[id number] DOB: 10-02-1946 DOA: 12/27/2020 PCP: Marin Olp, MD   Chief Complaint  Patient presents with  . Code Stroke  Brief Narrative: 48 old male with history of A. fib noncompliant with home anticoagulation, chronic combined systolic and diastolic CHF noncompliant with home meds, CAD, ST elevation MI history, TIAs, ulcerative colitis, diabetes mellitus, OSA on CPAP, hypothyroidism presented to the ED 3/20 after becoming diaphoretic, short of breath and shaky at home and abrupt onset of altered mental status, blood pressure at home was in 200, EMS was dispatched and gave nitro after which he became unresponsive and was intubated en route.  Family recently identified that patient has been diverting  Home medication for past few weeks to months. In the ED CT head did not show any acute CVA, patient was in A. fib but went into V. tach maintaining pulse and was given 150 of amiodarone converted back to A. fib he started on Cardizem drip.  Lab with elevated BNP. Patient is admitted to ICU. Patient was treated for acute encephalopathy lactic acidosis leukocytosis that resolved.  Treated for acute on chronic respiratory failure with hypoxia pulmonary edema due to acute  systolic CHF, intubated and subsequently extubated 3/21.  He was seen by cardiology. Patient medication has been changed.  Volume status optimized transferred to [facility] 3/23  Subjective: Seen and examined this morning. Breathing better today Overnight blood pressure stable, doing well on room air.  Lab work remains stable  Assessment & Plan:  Acute systolic CHF with EF 81% per cardiology previous recent echo 50% Hypertensive emergency with pulmonary edema/respiratory distress Blood pressure stable.  Volume status is stable.  Appreciate cardiology input now on goal-directed medical therapies, continue Entresto, starting Jardiance, transitioning to oral Lasix,starting Aldactone 25 mg daily,  decreasing potassium to 20 mEq twice daily and will need close monitoring.  Extensively discussed for compliance.  Will need to follow-up with CHF clinic closely.  Net negative output with weight improved as below Net IO Since Admission: -1,856.16 mL [12/30/20 1505]  Wt Readings from Last 3 Encounters:  12/30/20 103.6 kg  11/06/20 111.7 kg  11/02/20 110.1 kg   CAD status post DES in LAD 2004/history of STEMI-2008: Cardiology on board.  Continue Plavix, Pradaxa for A. fib, aspirin stopped.  Continue Coreg, Lipitor.  Currently no angina symptoms.    A. fib permanent reasonably controlled continue Pradaxa also on Plavix due to CAD, aspirin has been stopped  V. tach improved after AMIODARONE in the ED  Acute respiratory failure with hypoxia 2/2 #1.  Given his polycythemia concern about chronic component of hypoxia.  Continue CPAP nightly for OSA. Outpatient assessment and pulmonary follow-up.  Doing well on room air.  He has a smoking history and will need PFT as outpatient to evaluate for COPD  OSA on CPAP continue.  Thrombocytopenia: Stable at on 100k Recent Labs  Lab 12/27/20 1732 12/28/20 0540 12/29/20 0245 12/30/20 0245  PLT 153 115* 98* 111*   Hypothyroidism: Continue home Synthroid  History of ulcerative colitis continue mesalamine  Type 2 diabetes mellitus: Glucose well controlled, continue sliding scale Recent Labs  Lab 12/29/20 1157 12/29/20 1601 12/29/20 1948 12/29/20 2208 12/30/20 0725  GLUCAP 100* 146* 144* 133* 109*   CKD stage IIIa creatinine is stable monitor closely while on diuretics and Aldactone. Recent Labs  Lab 12/27/20 1728 12/27/20 1732 12/28/20 0540 12/29/20 0245 12/30/20 0245  BUN 20 16 22  26* 28*  CREATININE 1.10 1.38*  1.49* 1.41* 1.24   Polycythemia outpatient follow-up.  Diet Order            Diet Heart Room service appropriate? Yes; Fluid consistency: Thin  Diet effective now               Patient's Body mass index is 34.74  kg/m. DVT prophylaxis: SCDs Start: 12/27/20 2010 Code Status:   Code Status: Full Code  Family Communication: plan of care discussed with patient at bedside. Called granddaughter per request no answer-unable to leave message. Status is: Inpatient  Remains inpatient appropriate because:Inpatient level of care appropriate due to severity of illness  Dispo: The patient is from: Home              Anticipated d/c is to: Home hopefully tomorrow once lab is stable and okay with cardiology.              Patient currently is not medically stable to d/c.   Difficult to place patient No   Unresulted Labs (From admission, onward)          Start     Ordered   12/28/20 0500  CBC  Daily,   R      12/27/20 2043          Medications reviewed:  Scheduled Meds: . atorvastatin  20 mg Oral Daily  . carvedilol  25 mg Oral BID  . Chlorhexidine Gluconate Cloth  6 each Topical Daily  . clopidogrel  75 mg Oral Daily  . dabigatran  150 mg Oral Q12H  . furosemide  40 mg Intravenous BID  . insulin aspart  0-20 Units Subcutaneous TID WC  . insulin aspart  0-5 Units Subcutaneous QHS  . levothyroxine  50 mcg Oral Q0600  . mouth rinse  15 mL Mouth Rinse BID  . potassium chloride  40 mEq Oral BID  . saccharomyces boulardii  250 mg Oral BID  . sacubitril-valsartan  1 tablet Oral BID   Continuous Infusions: . sodium chloride Stopped (12/28/20 0703)    Consultants:see note  Procedures:see note  Antimicrobials: Anti-infectives (From admission, onward)   Start     Dose/Rate Route Frequency Ordered Stop   12/27/20 1945  cefTRIAXone (ROCEPHIN) 1 g in sodium chloride 0.9 % 100 mL IVPB        1 g 200 mL/hr over 30 Minutes Intravenous  Once 12/27/20 1939 12/27/20 2123   12/27/20 1945  azithromycin (ZITHROMAX) 500 mg in sodium chloride 0.9 % 250 mL IVPB        500 mg 250 mL/hr over 60 Minutes Intravenous  Once 12/27/20 1939 12/28/20 0645     Culture/Microbiology No results found for: SDES,  SPECREQUEST, CULT, REPTSTATUS  Other culture-see note  Objective: Vitals: Today's Vitals   12/29/20 2326 12/30/20 0600 12/30/20 0727 12/30/20 0846  BP:  (!) 149/86 (!) 148/95 (!) 153/87  Pulse: 76  71 78  Resp:   18   Temp:  97.6 F (36.4 C) 97.7 F (36.5 C)   TempSrc:  Oral Oral   SpO2: 96% 98% 95% 95%  Weight: 103.7 kg 103.6 kg    Height: 5' 8"  (1.727 m)     PainSc: 0-No pain   0-No pain    Intake/Output Summary (Last 24 hours) at 12/30/2020 1117 Last data filed at 12/30/2020 0800 Gross per 24 hour  Intake 960 ml  Output 700 ml  Net 260 ml   Filed Weights   12/29/20 0500 12/29/20 2326 12/30/20 0600  Weight: 105.3  kg 103.7 kg 103.6 kg   Weight change: -1.562 kg  Intake/Output from previous day: 03/22 0701 - 03/23 0700 In: 960 [P.O.:960] Out: 1150 [Urine:1150] Intake/Output this shift: Total I/O In: 240 [P.O.:240] Out: -  Filed Weights   12/29/20 0500 12/29/20 2326 12/30/20 0600  Weight: 105.3 kg 103.7 kg 103.6 kg    Examination: General exam: AAOX3, on room air,NAD, weak appearing. HEENT:Oral mucosa moist, Ear/Nose WNL grossly,dentition normal. Respiratory system: bilaterally the sounds,no use of accessory muscle, non tender. Cardiovascular system: S1 & S2 +, regular, No JVD. Gastrointestinal system: Abdomen soft, NT,ND, BS+. Nervous System:Alert, awake, moving extremities and grossly nonfocal Extremities: No edema, distal peripheral pulses palpable.  Skin: No rashes,no icterus. MSK: Normal muscle bulk,tone, power  Data Reviewed: I have personally reviewed following labs and imaging studies CBC: Recent Labs  Lab 12/27/20 1732 12/27/20 1733 12/27/20 1754 12/28/20 0540 12/29/20 0245 12/30/20 0245  WBC 14.9*  --   --  12.7* 10.5 7.7  HGB 19.0* 20.1* 18.7* 18.0* 17.2* 17.0  HCT 58.0* 59.0* 55.0* 51.9 48.7 48.5  MCV 93.9  --   --  88.3 87.7 88.5  PLT 153  --   --  115* 98* 564*   Basic Metabolic Panel: Recent Labs  Lab 12/27/20 1728 12/27/20 1732  12/27/20 1733 12/27/20 1754 12/28/20 0540 12/29/20 0245 12/30/20 0245  NA 138 134* 138 137 140 137 137  K 4.0 3.8 3.9 4.3 3.8 3.4* 3.5  CL 103 100  --   --  105 104 103  CO2  --  19*  --   --  25 23 26   GLUCOSE 207* 209*  --   --  146* 132* 185*  BUN 20 16  --   --  22 26* 28*  CREATININE 1.10 1.38*  --   --  1.49* 1.41* 1.24  CALCIUM  --  8.2*  --   --  8.8* 8.6* 8.4*  MG  --   --   --   --  2.4  --   --   PHOS  --   --   --   --  4.1  --   --    GFR: Estimated Creatinine Clearance: 61 mL/min (by C-G formula based on SCr of 1.24 mg/dL). Liver Function Tests: Recent Labs  Lab 12/27/20 1732  AST 34  ALT 16  ALKPHOS 110  BILITOT 1.4*  PROT 7.1  ALBUMIN 4.0   No results for input(s): LIPASE, AMYLASE in the last 168 hours. No results for input(s): AMMONIA in the last 168 hours. Coagulation Profile: Recent Labs  Lab 12/27/20 1732  INR 1.3*   Cardiac Enzymes: No results for input(s): CKTOTAL, CKMB, CKMBINDEX, TROPONINI in the last 168 hours. BNP (last 3 results) No results for input(s): PROBNP in the last 8760 hours. HbA1C: Recent Labs    12/27/20 2158  HGBA1C 5.9*   CBG: Recent Labs  Lab 12/29/20 1157 12/29/20 1601 12/29/20 1948 12/29/20 2208 12/30/20 0725  GLUCAP 100* 146* 144* 133* 109*   Lipid Profile: Recent Labs    12/28/20 0540  TRIG 29   Thyroid Function Tests: Recent Labs    12/27/20 2158  TSH 2.036   Anemia Panel: No results for input(s): VITAMINB12, FOLATE, FERRITIN, TIBC, IRON, RETICCTPCT in the last 72 hours. Sepsis Labs: Recent Labs  Lab 12/27/20 1732 12/27/20 2158  LATICACIDVEN 5.3* 1.6    Recent Results (from the past 240 hour(s))  Resp Panel by RT-PCR (Flu A&B, Covid) Nasopharyngeal Swab  Status: None   Collection Time: 12/27/20  5:27 PM   Specimen: Nasopharyngeal Swab; Nasopharyngeal(NP) swabs in vial transport medium  Result Value Ref Range Status   SARS Coronavirus 2 by RT PCR NEGATIVE NEGATIVE Final    Comment:  (NOTE) SARS-CoV-2 target nucleic acids are NOT DETECTED.  The SARS-CoV-2 RNA is generally detectable in upper respiratory specimens during the acute phase of infection. The lowest concentration of SARS-CoV-2 viral copies this assay can detect is 138 copies/mL. A negative result does not preclude SARS-Cov-2 infection and should not be used as the sole basis for treatment or other patient management decisions. A negative result may occur with  improper specimen collection/handling, submission of specimen other than nasopharyngeal swab, presence of viral mutation(s) within the areas targeted by this assay, and inadequate number of viral copies(<138 copies/mL). A negative result must be combined with clinical observations, patient history, and epidemiological information. The expected result is Negative.  Fact Sheet for Patients:  EntrepreneurPulse.com.au  Fact Sheet for Healthcare Providers:  IncredibleEmployment.be  This test is no t yet approved or cleared by the [place] FDA and  has been authorized for detection and/or diagnosis of SARS-CoV-2 by FDA under an Emergency Use Authorization (EUA). This EUA will remain  in effect (meaning this test can be used) for the duration of the COVID-19 declaration under Section 564(b)(1) of the Act, 21 U.S.C.section 360bbb-3(b)(1), unless the authorization is terminated  or revoked sooner.       Influenza A by PCR NEGATIVE NEGATIVE Final   Influenza B by PCR NEGATIVE NEGATIVE Final    Comment: (NOTE) The Xpert Xpress SARS-CoV-2/FLU/RSV plus assay is intended as an aid in the diagnosis of influenza from Nasopharyngeal swab specimens and should not be used as a sole basis for treatment. Nasal washings and aspirates are unacceptable for Xpert Xpress SARS-CoV-2/FLU/RSV testing.  Fact Sheet for Patients: EntrepreneurPulse.com.au  Fact Sheet for Healthcare  Providers: IncredibleEmployment.be  This test is not yet approved or cleared by the [place] FDA and has been authorized for detection and/or diagnosis of SARS-CoV-2 by FDA under an Emergency Use Authorization (EUA). This EUA will remain in effect (meaning this test can be used) for the duration of the COVID-19 declaration under Section 564(b)(1) of the Act, 21 U.S.C. section 360bbb-3(b)(1), unless the authorization is terminated or revoked.  Performed at [facility], [place] [address], [place], [place] [address]   MRSA PCR Screening     Status: None   Collection Time: 12/27/20 10:16 PM   Specimen: Nasal Mucosa; Nasopharyngeal  Result Value Ref Range Status   MRSA by PCR NEGATIVE NEGATIVE Final    Comment:        The GeneXpert MRSA Assay (FDA approved for NASAL specimens only), is one component of a comprehensive MRSA colonization surveillance program. It is not intended to diagnose MRSA infection nor to guide or monitor treatment for MRSA infections. Performed at [facility], [place] [address] [place], [place] [address]      Radiology Studies: No results found.   LOS: 3 days   Antonieta Pert, MD [facility]  12/30/2020, 11:17 AM

## 2020-12-30 NOTE — Telephone Encounter (Signed)
Call made to patient, spoke with his wife, confirmed patient DOB. Patient wife declined appt in [place] stating all their medical doctors are in [place] and they do not mind driving. Hospital f/u appt made for 01/27/21 with VS. Patient wife requested that we mail the appt reminder. Printed and mailed.   Nothing further needed at this time.

## 2020-12-31 ENCOUNTER — Encounter: Payer: Self-pay | Admitting: Internal Medicine

## 2020-12-31 ENCOUNTER — Other Ambulatory Visit: Payer: Self-pay

## 2020-12-31 ENCOUNTER — Telehealth: Payer: Self-pay | Admitting: *Deleted

## 2020-12-31 DIAGNOSIS — I5022 Chronic systolic (congestive) heart failure: Secondary | ICD-10-CM

## 2020-12-31 DIAGNOSIS — E1151 Type 2 diabetes mellitus with diabetic peripheral angiopathy without gangrene: Secondary | ICD-10-CM

## 2020-12-31 LAB — BASIC METABOLIC PANEL
Anion gap: 7 (ref 5–15)
BUN: 25 mg/dL — ABNORMAL HIGH (ref 8–23)
CO2: 27 mmol/L (ref 22–32)
Calcium: 8.5 mg/dL — ABNORMAL LOW (ref 8.9–10.3)
Chloride: 104 mmol/L (ref 98–111)
Creatinine, Ser: 1.4 mg/dL — ABNORMAL HIGH (ref 0.61–1.24)
GFR, Estimated: 53 mL/min — ABNORMAL LOW (ref >60)
Glucose, Bld: 119 mg/dL — ABNORMAL HIGH (ref 70–99)
Potassium: 3.6 mmol/L (ref 3.5–5.1)
Sodium: 138 mmol/L (ref 135–145)

## 2020-12-31 LAB — CBC
HCT: 51.9 % (ref 39.0–52.0)
Hemoglobin: 17.9 g/dL — ABNORMAL HIGH (ref 13.0–17.0)
MCH: 30.6 pg (ref 26.0–34.0)
MCHC: 34.5 g/dL (ref 30.0–36.0)
MCV: 88.7 fL (ref 80.0–100.0)
Platelets: 119 10*3/uL — ABNORMAL LOW (ref 150–400)
RBC: 5.85 MIL/uL — ABNORMAL HIGH (ref 4.22–5.81)
RDW: 13.2 % (ref 11.5–15.5)
WBC: 7.6 10*3/uL (ref 4.0–10.5)
nRBC: 0 % (ref 0.0–0.2)

## 2020-12-31 LAB — GLUCOSE, CAPILLARY: Glucose-Capillary: 136 mg/dL — ABNORMAL HIGH (ref 70–99)

## 2020-12-31 MED ORDER — ATORVASTATIN CALCIUM 20 MG PO TABS: 20.0000 mg | ORAL_TABLET | Freq: Every day | ORAL | 0 refills | Status: DC

## 2020-12-31 MED ORDER — DABIGATRAN ETEXILATE MESYLATE 150 MG PO CAPS: 150.0000 mg | ORAL_CAPSULE | Freq: Two times a day (BID) | ORAL | 0 refills | Status: DC

## 2020-12-31 MED ORDER — LEVOTHYROXINE SODIUM 50 MCG PO TABS: 50.0000 ug | ORAL_TABLET | Freq: Every day | ORAL | 0 refills | Status: DC

## 2020-12-31 MED ORDER — POTASSIUM CHLORIDE CRYS ER 20 MEQ PO TBCR: 20.0000 meq | EXTENDED_RELEASE_TABLET | Freq: Two times a day (BID) | ORAL | 0 refills | Status: DC

## 2020-12-31 MED ORDER — ENTRESTO 97-103 MG PO TABS: 1.0000 | ORAL_TABLET | Freq: Two times a day (BID) | ORAL | 0 refills | Status: AC

## 2020-12-31 MED ORDER — SPIRONOLACTONE 25 MG PO TABS: 25.0000 mg | ORAL_TABLET | Freq: Every day | ORAL | 0 refills | Status: DC

## 2020-12-31 MED ORDER — FUROSEMIDE 40 MG PO TABS: 40.0000 mg | ORAL_TABLET | Freq: Two times a day (BID) | ORAL | 0 refills | Status: DC

## 2020-12-31 MED ORDER — EMPAGLIFLOZIN 10 MG PO TABS: 10.0000 mg | ORAL_TABLET | Freq: Every day | ORAL | 0 refills | Status: DC

## 2020-12-31 MED ORDER — CLOPIDOGREL BISULFATE 75 MG PO TABS
75.0000 mg | ORAL_TABLET | Freq: Every day | ORAL | 0 refills | Status: DC
Start: 2021-01-01 — End: 2020-12-31

## 2020-12-31 NOTE — Care Management Important Message (Signed)
Important Message  Patient Details  Name: [name] MRN: [id number] Date of Birth: September 18, 1946   Medicare Important Message Given:  Yes     Shelda Altes 12/31/2020, 8:45 AM

## 2020-12-31 NOTE — Discharge Summary (Signed)
Physician Discharge Summary  [name] RFF:[id number] DOB: 02-24-1946 DOA: 12/27/2020  PCP: Marin Olp, MD  Admit date: 12/27/2020 Discharge date: 12/31/2020  Admitted From: home Disposition:  hom  Recommendations for Outpatient Follow-up:  1. Follow up with PCP and w/ cardiology in 1 weeks 2. Please obtain BMP/CBC in one week  Home Health:no  Equipment/Devices: none  Discharge Condition: Stable Code Status:   Code Status: Full Code Diet recommendation:  Diet Order            Diet - low sodium heart healthy           Diet Heart Room service appropriate? Yes; Fluid consistency: Thin  Diet effective now                Brief/Interim Summary: 75 old male with history of A. fib noncompliant with home anticoagulation, chronic combined systolic and diastolic CHF noncompliant with home meds, CAD, ST elevation MI history, TIAs, ulcerative colitis, diabetes mellitus, OSA on CPAP, hypothyroidism presented to the ED 3/20 after becoming diaphoretic, short of breath and shaky at home and abrupt onset of altered mental status, blood pressure at home was in 200, EMS was dispatched and gave nitro after which he became unresponsive and was intubated en route.  Family recently identified that patient has been diverting  Home medication for past few weeks to months. In the ED CT head did not show any acute CVA, patient was in A. fib but went into V. tach maintaining pulse and was given 150 of amiodarone converted back to A. fib he started on Cardizem drip.  Lab with elevated BNP.Patient was admitted to ICU. Patient was treated for acute encephalopathy lactic acidosis leukocytosis that resolved.  Treated for acute on chronic respiratory failure with hypoxia pulmonary edema due to acute  systolic CHF, intubated and subsequently extubated 3/21.  He was seen by cardiology. Patient medication has been changed.  Volume status optimized transferred to [facility] 3/23 Volume status remains a stable he is on  room air alert awake oriented x3.  Discussed with cardiology and okay discharge home on multiple cardiac medications new prescription sent.  Discharge Diagnoses:  Acute systolic CHF with EF 35% per cardiology previous recent echo 50% Hypertensive emergency with pulmonary edema/respiratory distress Blood pressure stabl.Volume status is stable.  Appreciate cardiology input discussed this morning and okay to discharge home on current medication with-Entresto,Jardiance, Lasix 40 mg bid,, Aldactone 25 mg daily, decreasing potassium to 20 mEq twice daily and will need close monitoring-with BMP next week, I have given prescription for 1 week of potassium chloride until his next lab.Extensively discussed for compliance.  Will need to follow-up with CHF clinic closely.  Net negative output with weight improved as below Net IO Since Admission: -1,616.16 mL [12/31/20 1053]  Wt Readings from Last 3 Encounters:  12/31/20 102.5 kg  11/06/20 111.7 kg  11/02/20 110.1 kg   CAD status post DES in LAD 2004/history of STEMI-2008: Cardiology on board.  Continue Plavix, Pradaxa for A. fib, aspirin stopped.  Continue Coreg, Lipitor.  Currently no angina symptoms.    A. fib permanent reasonably controlled continue Pradaxa also on Plavix due to CAD, aspirin has been stopped  V. tach improved after rx in the ED  Acute respiratory failure with hypoxia 2/2 #1.  Given his polycythemia concern about chronic component of hypoxia.  Continue CPAP nightly for OSA. Outpatient assessment and pulmonary follow-up.Doing well on room air.  He has a smoking history and will need PFT as  outpatient to evaluate for COPD  OSA on CPAP continue.  Thrombocytopenia: Stable at on 119k improving. Recent Labs  Lab 12/27/20 1732 12/28/20 0540 12/29/20 0245 12/30/20 0245 12/31/20 0251  PLT 153 115* 98* 111* 119*   Hypothyroidism: Continue home Synthroid  History of ulcerative colitis continue home medication  Type 2 diabetes  mellitus: Resume his home medication.  Overall stable.  HbA1c is 5.9.  Recent Labs  Lab 12/30/20 0725 12/30/20 1204 12/30/20 1806 12/30/20 2213 12/31/20 0736  GLUCAP 109* 121* 102* 185* 136*   CKD stage IIIa creatinine is stable holding in 1.4 range.  Continue diuretics Aldactone.  Repeat BMP in 1 week patient was instructed.  Recent Labs  Lab 12/27/20 1732 12/28/20 0540 12/29/20 0245 12/30/20 0245 12/31/20 0251  BUN 16 22 26* 28* 25*  CREATININE 1.38* 1.49* 1.41* 1.24 1.40*   Polycythemia outpatient follow-up.  Noncompliance with medication: Patient has been not taking his medication and diverting them. This was extensively discussed.Patient acknowledged that he was not taking his medication as he was supposed to.  If further stated that moving forward he was going to be compliant with this medication.  Consults:  Cardiology, pulmonary  Subjective: Patient is alert awake oriented this morning.  He feels ready for home.  He told me that he has not been taking his medications as instructed but moving forward he stated he is going to be compliant . It was recommended Discharge Exam: Vitals:   12/31/20 0747 12/31/20 1020  BP: (!) 135/92 129/71  Pulse: 81 90  Resp: 18 16  Temp: 97.6 F (36.4 C) 97.6 F (36.4 C)  SpO2: 96%    General: Pt is alert, awake, not in acute distress Cardiovascular: RRR, S1/S2 +, no rubs, no gallops Respiratory: CTA bilaterally, no wheezing, no rhonchi Abdominal: Soft, NT, ND, bowel sounds + Extremities: no edema, no cyanosis  Discharge Instructions  Discharge Instructions    Diet - low sodium heart healthy   Complete by: As directed    Discharge instructions   Complete by: As directed    Please call call MD or return to ER for similar or worsening recurring problem that brought you to hospital or if any fever,nausea/vomiting,abdominal pain, uncontrolled pain, chest pain,  shortness of breath or any other alarming symptoms.  Please  follow-up your doctor as instructed in a week time and call the office for appointment. Check your BMP in 5 to 7 days to see if you need to continue on potassium.  Please take your medication as prescribed  and like we discussed while you are hospitalized.  You need to follow-up with your cardiologist next week, repeat BMP panel for kidney function and potassium.   Please avoid alcohol, smoking, or any other illicit substance and maintain healthy habits including taking your regular medications as prescribed.  You were cared for by a hospitalist during your hospital stay. If you have any questions about your discharge medications or the care you received while you were in the hospital after you are discharged, you can call the unit and ask to speak with the hospitalist on call if the hospitalist that took care of you is not available.  Once you are discharged, your primary care physician will handle any further medical issues. Please note that NO REFILLS for any discharge medications will be authorized once you are discharged, as it is imperative that you return to your primary care physician (or establish a relationship with a primary care physician if you do not  have one) for your aftercare needs so that they can reassess your need for medications and monitor your lab values.  Check weight daily. Restrict fluid intake to 1200 ml daily and salt intake to <2 gm daily. Call MD:  Anytime you have any of the following symptoms: 1) 3 pound weight gain in 24 hours or 5 pounds in 1 week 2) shortness of breath, with or without a dry hacking cough 3) swelling in the hands, feet or stomach 4) if you have to sleep on extra pillows at night in order to breathe   Increase activity slowly   Complete by: As directed      Allergies as of 12/31/2020      Reactions   Clindamycin/lincomycin    Developed C. difficile colitis 1 month after use      Medication List    STOP taking these medications   aspirin  EC 81 MG tablet   glipiZIDE 10 MG tablet Commonly known as: GLUCOTROL     TAKE these medications   Apriso 0.375 g 24 hr capsule Generic drug: mesalamine Take 4 capsules (1.5 g total) by mouth daily.   atorvastatin 20 MG tablet Commonly known as: LIPITOR TAKE ONE TABLET (20MG TOTAL) BY MOUTH DAILY   carvedilol 25 MG tablet Commonly known as: COREG TAKE ONE TABLET BY MOUTH TWICE A DAY   clopidogrel 75 MG tablet Commonly known as: PLAVIX TAKE ONE TABLET (75MG TOTAL) BY MOUTH DAILY   COQ10 PO Take 1 capsule by mouth daily with supper.   dicyclomine 10 MG capsule Commonly known as: BENTYL TAKE ONE CAPSULE (10MG TOTAL) BY MOUTH TWO TIMES DAILY AS NEEDED FOR DIARRHEA OR ABDOMINAL CRAMPING   Entresto 97-103 MG Generic drug: sacubitril-valsartan Take 1 tablet by mouth 2 (two) times daily.   fish oil-omega-3 fatty acids 1000 MG capsule Take 1 g by mouth daily with supper.   furosemide 40 MG tablet Commonly known as: LASIX Take 1 tablet (40 mg total) by mouth 2 (two) times daily. What changed: when to take this   glucose blood test strip Use as instructed to test 4 time daily   isosorbide mononitrate 60 MG 24 hr tablet Commonly known as: IMDUR TAKE ONE (1) TABLET BY MOUTH EVERY DAY. Please make yearly appt with [name] for June 2022 for future refills. Thank you 1st attempt   Jardiance 10 MG Tabs tablet Generic drug: empagliflozin TAKE ONE (1) TABLET BY MOUTH EVERY DAY   potassium chloride SA 20 MEQ tablet Commonly known as: KLOR-CON Take 1 tablet (20 mEq total) by mouth 2 (two) times daily for 7 days. Check your BMP in 5 to 7 days to see if you need to continue on potassium.   Pradaxa 150 MG Caps capsule Generic drug: dabigatran TAKE ONE (1) CAPSULE BY MOUTH TWICE A DAY. (EVERY 12 HOURS.)   saccharomyces boulardii 250 MG capsule Commonly known as: Florastor Take 1 capsule (250 mg total) by mouth 2 (two) times daily.   sitaGLIPtin 100 MG tablet Commonly known  as: Januvia TAKE ONE (1) TABLET BY MOUTH EVERY DAY   spironolactone 25 MG tablet Commonly known as: ALDACTONE TAKE ONE (1) TABLET BY MOUTH EVERY DAY   Synthroid 50 MCG tablet Generic drug: levothyroxine TAKE ONE TABLET BY MOUTH DAILY BEFORE BREAKFAST       Follow-up Information    Marin Olp, MD Follow up in 1 week(s).   Specialty: Family Medicine Contact information: [address] [place] [address] [phone]  Sherren Mocha, MD .   Specialty: Cardiology Contact information: [phone] [address] [place] [address] [phone]        Thompson Grayer, MD .   Specialty: Cardiology Contact information: [place] [address] [phone]        Bensimhon, Shaune Pascal, MD .   Specialty: Cardiology Contact information: [place] [address] [phone]              Allergies  Allergen Reactions  . Clindamycin/Lincomycin     Developed C. difficile colitis 1 month after use    The results of significant diagnostics from this hospitalization (including imaging, microbiology, ancillary and laboratory) are listed below for reference.    Microbiology: Recent Results (from the past 240 hour(s))  Resp Panel by RT-PCR (Flu A&B, Covid) Nasopharyngeal Swab     Status: None   Collection Time: 12/27/20  5:27 PM   Specimen: Nasopharyngeal Swab; Nasopharyngeal(NP) swabs in vial transport medium  Result Value Ref Range Status   SARS Coronavirus 2 by RT PCR NEGATIVE NEGATIVE Final    Comment: (NOTE) SARS-CoV-2 target nucleic acids are NOT DETECTED.  The SARS-CoV-2 RNA is generally detectable in upper respiratory specimens during the acute phase of infection. The lowest concentration of SARS-CoV-2 viral copies this assay can detect is 138 copies/mL. A negative result does not preclude SARS-Cov-2 infection and should not be used as the sole basis for treatment  or other patient management decisions. A negative result may occur with  improper specimen collection/handling, submission of specimen other than nasopharyngeal swab, presence of viral mutation(s) within the areas targeted by this assay, and inadequate number of viral copies(<138 copies/mL). A negative result must be combined with clinical observations, patient history, and epidemiological information. The expected result is Negative.  Fact Sheet for Patients:  EntrepreneurPulse.com.au  Fact Sheet for Healthcare Providers:  IncredibleEmployment.be  This test is no t yet approved or cleared by the [place] FDA and  has been authorized for detection and/or diagnosis of SARS-CoV-2 by FDA under an Emergency Use Authorization (EUA). This EUA will remain  in effect (meaning this test can be used) for the duration of the COVID-19 declaration under Section 564(b)(1) of the Act, 21 U.S.C.section 360bbb-3(b)(1), unless the authorization is terminated  or revoked sooner.       Influenza A by PCR NEGATIVE NEGATIVE Final   Influenza B by PCR NEGATIVE NEGATIVE Final    Comment: (NOTE) The Xpert Xpress SARS-CoV-2/FLU/RSV plus assay is intended as an aid in the diagnosis of influenza from Nasopharyngeal swab specimens and should not be used as a sole basis for treatment. Nasal washings and aspirates are unacceptable for Xpert Xpress SARS-CoV-2/FLU/RSV testing.  Fact Sheet for Patients: EntrepreneurPulse.com.au  Fact Sheet for Healthcare Providers: IncredibleEmployment.be  This test is not yet approved or cleared by the [place] FDA and has been authorized for detection and/or diagnosis of SARS-CoV-2 by FDA under an Emergency Use Authorization (EUA). This EUA will remain in effect (meaning this test can be used) for the duration of the COVID-19 declaration under Section 564(b)(1) of the Act, 21 U.S.C. section  360bbb-3(b)(1), unless the authorization is terminated or revoked.  Performed at [facility], [place] [address] [place], [place] [address]   MRSA PCR Screening     Status: None   Collection Time: 12/27/20 10:16 PM   Specimen: Nasal Mucosa; Nasopharyngeal  Result Value Ref Range Status   MRSA by  PCR NEGATIVE NEGATIVE Final    Comment:        The GeneXpert MRSA Assay (FDA approved for NASAL specimens only), is one component of a comprehensive MRSA colonization surveillance program. It is not intended to diagnose MRSA infection nor to guide or monitor treatment for MRSA infections. Performed at [facility], [place] [address], [place], [place] [address]     Procedures/Studies: CT Head Wo Contrast  Result Date: 12/27/2020 CLINICAL DATA:  Altered mental status. EXAM: CT HEAD WITHOUT CONTRAST TECHNIQUE: Contiguous axial images were obtained from the base of the skull through the vertex without intravenous contrast. COMPARISON:  10/28/2015 FINDINGS: Brain: There is atrophy and chronic small vessel disease changes. Old right basal ganglia lacunar infarct, stable. No acute intracranial abnormality. Specifically, no hemorrhage, hydrocephalus, mass lesion, acute infarction, or significant intracranial injury. Vascular: No hyperdense vessel or unexpected calcification. Skull: No acute calvarial abnormality. Sinuses/Orbits: No acute findings Other: None IMPRESSION: Atrophy, chronic microvascular disease. No acute intracranial abnormality. Electronically Signed   By: Rolm Baptise M.D.   On: 12/27/2020 19:03   DG Chest Port 1 View  Result Date: 12/28/2020 CLINICAL DATA:  Pulmonary edema EXAM: PORTABLE CHEST 1 VIEW COMPARISON:  Radiograph 12/27/2020 FINDINGS: Endotracheal tube tip terminates in the mid to upper trachea 6 cm from the carina. Transesophageal tube in place with side port just beyond the GE junction and tip below the margins of imaging. Telemetry leads and external support devices  overlie the chest. Low lung volumes with pulmonary vascular crowding. Indistinct hazy opacities in the lungs most pronounced in the the perihilar and mid to lower lung regions with likely layering left effusion partially obscuring the left hemidiaphragm. No discernible right pleural effusion nor visible pneumothorax. Cardiomegaly is similar to prior with a calcified, tortuous aorta. No acute osseous or soft tissue abnormality. IMPRESSION: Persistently low lung volumes. Grossly stable hazy opacities in the lungs most pronounced in the perihilar and mid to lower lung regions, findings are most consistent with pulmonary edema with likely layering left effusion. Unchanged cardiomegaly.  Likely tortuous, calcified aorta. Electronically Signed   By: Lovena Le M.D.   On: 12/28/2020 06:32   DG Chest Portable 1 View  Result Date: 12/27/2020 CLINICAL DATA:  Intubation, repositioning of endotracheal tube. EXAM: PORTABLE CHEST 1 VIEW COMPARISON:  12/27/2020 at 5:44 p.m. FINDINGS: The endotracheal tube tip is 4 cm above the carina, satisfactorily position. Nasogastric tube terminates in the stomach body. Low lung volumes are present, causing crowding of the pulmonary vasculature. Mild enlargement of the cardiopericardial silhouette with indistinct hazy opacities in the lungs. Increasing bibasilar airspace opacities compared to previous. Overall the airspace opacities are greater on the right than the left may reflect asymmetric edema or pneumonia. Irregular contour of the aortic arch possibly due to adjacent airspace opacity, strictly speaking mediastinal hematoma or dissection cannot be excluded on radiography. IMPRESSION: 1. Endotracheal tube tip is 4 cm above the carina. Nasogastric tube terminates in the stomach body. 2. Increasing bibasilar airspace opacities, right greater than left, compatible with asymmetric edema or pneumonia. 3. Irregular contour of the aortic arch, strictly speaking mediastinal hematoma or  dissection cannot be excluded on radiography. Electronically Signed   By: Van Clines M.D.   On: 12/27/2020 20:30   DG Chest Portable 1 View  Addendum Date: 12/27/2020   ADDENDUM REPORT: 12/27/2020 18:16 ADDENDUM: The original report was by Dr. Van Clines. The following addendum is by Dr. Van Clines: Critical Value/emergent results were called by telephone  at the time of interpretation on 12/27/2020 at 6:08 pm to provider Dr. Blanchie Dessert , who verbally acknowledged these results. Electronically Signed   By: Van Clines M.D.   On: 12/27/2020 18:16   Result Date: 12/27/2020 CLINICAL DATA:  Respiratory failure.  Shortness of breath EXAM: PORTABLE CHEST 1 VIEW COMPARISON:  10/24/2015 FINDINGS: The endotracheal tube is just into the right mainstem bronchus near the carina. Retraction of 3 cm recommended. Defibrillator pads noted. Indistinct airspace opacities in the right lung and to a lesser extent in the left perihilar region potentially from asymmetric edema or pneumonia. No blunting of the costophrenic angles. IMPRESSION: 1. The endotracheal tube is just into the right mainstem bronchus near the carina (malpositioned). Retraction of 3 cm recommended. 2. Indistinct airspace opacities in the right lung and to a lesser extent in the left perihilar region, potentially from asymmetric edema or pneumonia. Radiology assistant personnel have been notified to put me in telephone contact with the referring physician or the referring physician's clinical representative in order to discuss these findings. Once this communication is established I will issue an addendum to this report for documentation purposes. Electronically Signed: By: Van Clines M.D. On: 12/27/2020 18:05   DG Abd Portable 1V  Result Date: 12/27/2020 CLINICAL DATA:  OG tube placement EXAM: PORTABLE ABDOMEN - 1 VIEW COMPARISON:  None. FINDINGS: OG tube tip is in the stomach.  Nonobstructive bowel gas pattern.  IMPRESSION: OG tube tip in the stomach. Electronically Signed   By: Rolm Baptise M.D.   On: 12/27/2020 23:40   ECHOCARDIOGRAM COMPLETE  Result Date: 12/28/2020    ECHOCARDIOGRAM REPORT   Patient Name:   [name] Date of Exam: 12/28/2020 Medical Rec #:  [id number]       Height:       67.0 in Accession #:    [id number]      Weight:       237.2 lb Date of Birth:  [birth year]       BSA:          2.174 m Patient Age:    54 years        BP:           172/92 mmHg Patient Gender: M               HR:           82 bpm. Exam Location:  Inpatient Procedure: 2D Echo, Cardiac Doppler, Color Doppler and Intracardiac            Opacification Agent Indications:    Congestive Heart Failure I50.9  History:        Patient has prior history of Echocardiogram examinations, most                 recent 09/12/2018. CHF, Arrythmias:Atrial Fibrillation and LBBB;                 Risk Factors:Hypertension and Diabetes. Chronic kidney disease.  Sonographer:    Darlina Sicilian RDCS Referring Phys: [phone] [place]  1. Poor acoustic windows limit study, even with the use of Definity. Overall LVEF is probably low normal with severe hypokinesis of the anteroseptal wall. Compared to echo from 04/29/18, LVEF is improved. . Left ventricular ejection fraction, by estimation, is 50 to 55%. The left ventricle has low normal function. There is mild left ventricular hypertrophy. Left ventricular diastolic parameters are indeterminate.  2. Right ventricular systolic function is normal. The right ventricular  size is normal.  3. Left atrial size was mildly dilated.  4. The mitral valve is normal in structure. No evidence of mitral valve regurgitation.  5. The aortic valve is abnormal. Aortic valve regurgitation is not visualized. Mild to moderate aortic valve sclerosis/calcification is present, without any evidence of aortic stenosis.  6. The inferior vena cava is normal in size with <50% respiratory variability, suggesting right atrial  pressure of 8 mmHg. FINDINGS  Left Ventricle: Poor acoustic windows limit study, even with the use of Definity. Overall LVEF is probably low normal with severe hypokinesis of the anteroseptal wall. Compared to echo from 04/29/18, LVEF is improved. Left ventricular ejection fraction, by estimation, is 50 to 55%. The left ventricle has low normal function. Definity contrast agent was given IV to delineate the left ventricular endocardial borders. The left ventricular internal cavity size was normal in size. There is mild left ventricular hypertrophy. Left ventricular diastolic parameters are indeterminate. Right Ventricle: The right ventricular size is normal. Right vetricular wall thickness was not assessed. Right ventricular systolic function is normal. Left Atrium: Left atrial size was mildly dilated. Right Atrium: Right atrial size was normal in size. Pericardium: There is no evidence of pericardial effusion. Mitral Valve: The mitral valve is normal in structure. No evidence of mitral valve regurgitation. Tricuspid Valve: The tricuspid valve is normal in structure. Tricuspid valve regurgitation is not demonstrated. Aortic Valve: The aortic valve is abnormal. Aortic valve regurgitation is not visualized. Mild to moderate aortic valve sclerosis/calcification is present, without any evidence of aortic stenosis. Pulmonic Valve: The pulmonic valve was grossly normal. Pulmonic valve regurgitation is not visualized. Aorta: The aortic root is normal in size and structure. Venous: The inferior vena cava is normal in size with less than 50% respiratory variability, suggesting right atrial pressure of 8 mmHg. IAS/Shunts: No atrial level shunt detected by color flow Doppler.  LEFT VENTRICLE PLAX 2D LVIDd:         4.60 cm LVIDs:         3.80 cm LV PW:         1.50 cm LV IVS:        1.40 cm LVOT diam:     2.00 cm LV SV:         46 LV SV Index:   21 LVOT Area:     3.14 cm  LV Volumes (MOD) LV vol d, MOD A2C: 70.8 ml LV vol d,  MOD A4C: 86.1 ml LV vol s, MOD A2C: 44.0 ml LV vol s, MOD A4C: 43.6 ml LV SV MOD A2C:     26.8 ml LV SV MOD A4C:     86.1 ml LV SV MOD BP:      36.2 ml LEFT ATRIUM             Index LA diam:        4.10 cm 1.89 cm/m LA Vol (A2C):   59.1 ml 27.18 ml/m LA Vol (A4C):   76.0 ml 34.95 ml/m LA Biplane Vol: 71.5 ml 32.88 ml/m  AORTIC VALVE LVOT Vmax:   94.00 cm/s LVOT Vmean:  61.600 cm/s LVOT VTI:    0.147 m  AORTA Ao Root diam: 3.30 cm MITRAL VALVE MV Area (PHT): 4.20 cm     SHUNTS MV Decel Time: 181 msec     Systemic VTI:  0.15 m MV E velocity: 105.67 cm/s  Systemic Diam: 2.00 cm Dorris Carnes MD Electronically signed by Dorris Carnes MD Signature Date/Time: 12/28/2020/1:36:43 PM  Final     Labs: BNP (last 3 results) Recent Labs    12/27/20 1739  BNP 096.0*   Basic Metabolic Panel: Recent Labs  Lab 12/27/20 1732 12/27/20 1733 12/27/20 1754 12/28/20 0540 12/29/20 0245 12/30/20 0245 12/31/20 0251  NA 134*   < > 137 140 137 137 138  K 3.8   < > 4.3 3.8 3.4* 3.5 3.6  CL 100  --   --  105 104 103 104  CO2 19*  --   --  25 23 26 27   GLUCOSE 209*  --   --  146* 132* 185* 119*  BUN 16  --   --  22 26* 28* 25*  CREATININE 1.38*  --   --  1.49* 1.41* 1.24 1.40*  CALCIUM 8.2*  --   --  8.8* 8.6* 8.4* 8.5*  MG  --   --   --  2.4  --   --   --   PHOS  --   --   --  4.1  --   --   --    < > = values in this interval not displayed.   Liver Function Tests: Recent Labs  Lab 12/27/20 1732  AST 34  ALT 16  ALKPHOS 110  BILITOT 1.4*  PROT 7.1  ALBUMIN 4.0   No results for input(s): LIPASE, AMYLASE in the last 168 hours. No results for input(s): AMMONIA in the last 168 hours. CBC: Recent Labs  Lab 12/27/20 1732 12/27/20 1733 12/27/20 1754 12/28/20 0540 12/29/20 0245 12/30/20 0245 12/31/20 0251  WBC 14.9*  --   --  12.7* 10.5 7.7 7.6  HGB 19.0*   < > 18.7* 18.0* 17.2* 17.0 17.9*  HCT 58.0*   < > 55.0* 51.9 48.7 48.5 51.9  MCV 93.9  --   --  88.3 87.7 88.5 88.7  PLT 153  --   --  115*  98* 111* 119*   < > = values in this interval not displayed.   Cardiac Enzymes: No results for input(s): CKTOTAL, CKMB, CKMBINDEX, TROPONINI in the last 168 hours. BNP: Invalid input(s): POCBNP CBG: Recent Labs  Lab 12/30/20 0725 12/30/20 1204 12/30/20 1806 12/30/20 2213 12/31/20 0736  GLUCAP 109* 121* 102* 185* 136*   D-Dimer No results for input(s): DDIMER in the last 72 hours. Hgb A1c No results for input(s): HGBA1C in the last 72 hours. Lipid Profile No results for input(s): CHOL, HDL, LDLCALC, TRIG, CHOLHDL, LDLDIRECT in the last 72 hours. Thyroid function studies No results for input(s): TSH, T4TOTAL, T3FREE, THYROIDAB in the last 72 hours.  Invalid input(s): FREET3 Anemia work up No results for input(s): VITAMINB12, FOLATE, FERRITIN, TIBC, IRON, RETICCTPCT in the last 72 hours. Urinalysis    Component Value Date/Time   COLORURINE AMBER (A) 04/16/2019 1810   APPEARANCEUR CLOUDY (A) 04/16/2019 1810   LABSPEC 1.029 04/16/2019 1810   PHURINE 6.0 04/16/2019 1810   GLUCOSEU 50 (A) 04/16/2019 1810   HGBUR SMALL (A) 04/16/2019 1810   BILIRUBINUR Negative 06/13/2019 1117   KETONESUR 5 (A) 04/16/2019 1810   PROTEINUR Positive (A) 06/13/2019 1117   PROTEINUR 100 (A) 04/16/2019 1810   UROBILINOGEN 0.2 06/13/2019 1117   NITRITE Negative 06/13/2019 1117   NITRITE NEGATIVE 04/16/2019 1810   LEUKOCYTESUR Negative 06/13/2019 1117   LEUKOCYTESUR LARGE (A) 04/16/2019 1810   Sepsis Labs Invalid input(s): PROCALCITONIN,  WBC,  LACTICIDVEN Microbiology Recent Results (from the past 240 hour(s))  Resp Panel by RT-PCR (Flu A&B, Covid) Nasopharyngeal  Swab     Status: None   Collection Time: 12/27/20  5:27 PM   Specimen: Nasopharyngeal Swab; Nasopharyngeal(NP) swabs in vial transport medium  Result Value Ref Range Status   SARS Coronavirus 2 by RT PCR NEGATIVE NEGATIVE Final    Comment: (NOTE) SARS-CoV-2 target nucleic acids are NOT DETECTED.  The SARS-CoV-2 RNA is generally  detectable in upper respiratory specimens during the acute phase of infection. The lowest concentration of SARS-CoV-2 viral copies this assay can detect is 138 copies/mL. A negative result does not preclude SARS-Cov-2 infection and should not be used as the sole basis for treatment or other patient management decisions. A negative result may occur with  improper specimen collection/handling, submission of specimen other than nasopharyngeal swab, presence of viral mutation(s) within the areas targeted by this assay, and inadequate number of viral copies(<138 copies/mL). A negative result must be combined with clinical observations, patient history, and epidemiological information. The expected result is Negative.  Fact Sheet for Patients:  EntrepreneurPulse.com.au  Fact Sheet for Healthcare Providers:  IncredibleEmployment.be  This test is no t yet approved or cleared by the [place] FDA and  has been authorized for detection and/or diagnosis of SARS-CoV-2 by FDA under an Emergency Use Authorization (EUA). This EUA will remain  in effect (meaning this test can be used) for the duration of the COVID-19 declaration under Section 564(b)(1) of the Act, 21 U.S.C.section 360bbb-3(b)(1), unless the authorization is terminated  or revoked sooner.       Influenza A by PCR NEGATIVE NEGATIVE Final   Influenza B by PCR NEGATIVE NEGATIVE Final    Comment: (NOTE) The Xpert Xpress SARS-CoV-2/FLU/RSV plus assay is intended as an aid in the diagnosis of influenza from Nasopharyngeal swab specimens and should not be used as a sole basis for treatment. Nasal washings and aspirates are unacceptable for Xpert Xpress SARS-CoV-2/FLU/RSV testing.  Fact Sheet for Patients: EntrepreneurPulse.com.au  Fact Sheet for Healthcare Providers: IncredibleEmployment.be  This test is not yet approved or cleared by the [place] FDA  and has been authorized for detection and/or diagnosis of SARS-CoV-2 by FDA under an Emergency Use Authorization (EUA). This EUA will remain in effect (meaning this test can be used) for the duration of the COVID-19 declaration under Section 564(b)(1) of the Act, 21 U.S.C. section 360bbb-3(b)(1), unless the authorization is terminated or revoked.  Performed at [facility], [place] [address], [place], [place] [address]   MRSA PCR Screening     Status: None   Collection Time: 12/27/20 10:16 PM   Specimen: Nasal Mucosa; Nasopharyngeal  Result Value Ref Range Status   MRSA by PCR NEGATIVE NEGATIVE Final    Comment:        The GeneXpert MRSA Assay (FDA approved for NASAL specimens only), is one component of a comprehensive MRSA colonization surveillance program. It is not intended to diagnose MRSA infection nor to guide or monitor treatment for MRSA infections. Performed at [facility], [place] [address] [place], [place] [address]      Time coordinating discharge: 35 minutes  SIGNED: Antonieta Pert, MD  [facility] 12/31/2020, 10:53 AM  If 7PM-7AM, please contact night-coverage [email or web address]

## 2020-12-31 NOTE — Progress Notes (Signed)
   No changes made from prior recommendations. Okay for discharge from cardiology perspective. Candee Furbish, MD

## 2020-12-31 NOTE — Patient Outreach (Signed)
[facility]) Care Management  12/31/2020  TYDEN KANN [birth year] [id number]  [place] [facility] [[facility]] Patient: [employer]  Plan: Notification sent to the [place] Management team member and made aware of follow up support needs.   Please contact for further questions,  Natividad Brood, RN BSN [facility] Liaison  [phone] business mobile phone Toll free office [phone]  Fax number: [phone] [email or web address]

## 2020-12-31 NOTE — Chronic Care Management (AMB) (Signed)
  Chronic Care Management   Outreach Note  12/31/2020 Name: KAHLI FITZGERALD MRN: [id number] DOB: 01/22/1946  KACEY VICUNA is a 75 y.o. year old male who is a primary care patient of Marin Olp, MD. I reached out to [name] by phone today in response to a referral sent by Mr. Philander Ake Gilkison's PCP, Dr. Yong Channel.     An unsuccessful telephone outreach was attempted today. The patient was referred to the case management team for assistance with care management and care coordination.   Follow Up Plan: The care management team will reach out to the patient again over the next 7 days.  If patient returns call to provider office, please advise to call [facility] at [phone].  [facility]

## 2021-01-01 ENCOUNTER — Telehealth: Payer: Self-pay

## 2021-01-01 NOTE — Telephone Encounter (Cosign Needed)
Transition Care Management Follow-up Telephone Call  Date of discharge and from where: [facility] 12/31/20  How have you been since you were released from the hospital? Doing pretty ok  Any questions or concerns? No  Items Reviewed:  Did the pt receive and understand the discharge instructions provided? Yes   Medications obtained and verified? Yes   Other? No   Any new allergies since your discharge? No   Dietary orders reviewed? Yes  Do you have support at home? Yes   Home Care and Equipment/Supplies: Were home health services ordered? not applicable If so, what is the name of the agency?   Has the agency set up a time to come to the patient's home? not applicable Were any new equipment or medical supplies ordered?  No What is the name of the medical supply agency?  Were you able to get the supplies/equipment? not applicable Do you have any questions related to the use of the equipment or supplies? No  Functional Questionnaire: (I = Independent and D = Dependent) ADLs: I  Bathing/Dressing- I  Meal Prep- I  Eating- I  Maintaining continence- I  Transferring/Ambulation- I  Managing Meds- I  Follow up appointments reviewed:   PCP Hospital f/u appt confirmed? Yes  Scheduled to see Dr Yong Channel  on 01/13/21 @ 11:20.  [facility] f/u appt confirmed? Yes  Scheduled to see Dr on 02/16/21   Are transportation arrangements needed? No   If their condition worsens, is the pt aware to call PCP or go to the Emergency Dept.? Yes  Was the patient provided with contact information for the PCP's office or ED? Yes  Was to pt encouraged to call back with questions or concerns? Yes

## 2021-01-02 NOTE — Telephone Encounter (Signed)
Noted thanks °

## 2021-01-06 ENCOUNTER — Ambulatory Visit (INDEPENDENT_AMBULATORY_CARE_PROVIDER_SITE_OTHER): Payer: Medicare Other | Admitting: Internal Medicine

## 2021-01-06 ENCOUNTER — Other Ambulatory Visit: Payer: Self-pay

## 2021-01-06 ENCOUNTER — Ambulatory Visit (INDEPENDENT_AMBULATORY_CARE_PROVIDER_SITE_OTHER): Payer: Medicare Other | Admitting: Nurse Practitioner

## 2021-01-06 ENCOUNTER — Encounter: Payer: Self-pay | Admitting: Nurse Practitioner

## 2021-01-06 ENCOUNTER — Encounter: Payer: Self-pay | Admitting: Internal Medicine

## 2021-01-06 VITALS — BP 128/82 | HR 68 | Ht 68.0 in | Wt 228.8 lb

## 2021-01-06 VITALS — BP 123/77 | HR 80 | Temp 97.1°F | Ht 67.0 in | Wt 229.8 lb

## 2021-01-06 DIAGNOSIS — R159 Full incontinence of feces: Secondary | ICD-10-CM | POA: Diagnosis not present

## 2021-01-06 DIAGNOSIS — I2510 Atherosclerotic heart disease of native coronary artery without angina pectoris: Secondary | ICD-10-CM

## 2021-01-06 DIAGNOSIS — E739 Lactose intolerance, unspecified: Secondary | ICD-10-CM | POA: Diagnosis not present

## 2021-01-06 DIAGNOSIS — E1151 Type 2 diabetes mellitus with diabetic peripheral angiopathy without gangrene: Secondary | ICD-10-CM | POA: Diagnosis not present

## 2021-01-06 DIAGNOSIS — E785 Hyperlipidemia, unspecified: Secondary | ICD-10-CM | POA: Diagnosis not present

## 2021-01-06 DIAGNOSIS — K5100 Ulcerative (chronic) pancolitis without complications: Secondary | ICD-10-CM

## 2021-01-06 DIAGNOSIS — R197 Diarrhea, unspecified: Secondary | ICD-10-CM

## 2021-01-06 DIAGNOSIS — E039 Hypothyroidism, unspecified: Secondary | ICD-10-CM | POA: Diagnosis not present

## 2021-01-06 MED ORDER — EMPAGLIFLOZIN 10 MG PO TABS: ORAL_TABLET | ORAL | 3 refills | Status: DC

## 2021-01-06 MED ORDER — GLUCOSE BLOOD VI STRP: ORAL_STRIP | 3 refills | Status: DC

## 2021-01-06 MED ORDER — SITAGLIPTIN PHOSPHATE 100 MG PO TABS
ORAL_TABLET | ORAL | 3 refills | Status: DC
Start: 2021-01-06 — End: 2022-01-11

## 2021-01-06 MED ORDER — METFORMIN HCL 500 MG PO TABS: 500.0000 mg | ORAL_TABLET | Freq: Two times a day (BID) | ORAL | 3 refills | Status: DC

## 2021-01-06 NOTE — Progress Notes (Signed)
Referring Provider: Marin Olp, MD Primary Care Physician:  Marin Olp, MD Primary GI:  Dr. Gala Romney  Chief Complaint  Patient presents with  . ulcerative pancolitis    F/u. Takes 2 probiotics daily and does fine    HPI:   [name] is a 75 y.o. male who presents for follow-up on ulcerative colitis.  The patient was last seen in our office 07/07/2020 for ulcerative pancolitis, C. difficile colitis, diarrhea.  Has been seen previously for C. difficile, UC, hemorrhoids.  On Plavix and Pradaxa for TIA, CVA, CAD, A. fib, CHF, diabetes.  Ulcerative colitis previously managed well on Apriso 1.5 g daily.  Previous episode of C. difficile in July 2020 that was treated with a standard course of vancomycin and Florastor.  He had recurrent symptoms and was given a vancomycin taper.  Second recurrence and was given Dificid 200 mg twice daily for 10 days.  He was referred to infectious disease at that point and noted recurrent stools negative for C. 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  Symptoms improved with Bentyl.  Also eliminated dairy from his diet.  Previous colonoscopy 12/06/2010 which found consistent with ulcerative pancolitis, no active bleeding or blood, no polyps or cancers, colitis found to be in remission on oral amino salicylates.  Recommended repeat colonoscopy in 5 years (2017).  Unfortunately, this was never completed because of recent TIA and other history is requiring anticoagulation and high risk for holding anticoagulation.  Recommended waiting at least a year.  At follow-up afterward the patient declined to schedule colonoscopy.  At his last visit he noted he was doing well overall, currently having 3 stools a day that are formed on dicyclomine twice daily and Florastor.  Good appetite and energy, no rashes or mouth sores.  Still on Apriso.  No other overt GI complaints.  Recommended he continue Florastor, Bentyl, Apriso.  Follow-up  in 6 months.  Discuss colonoscopy at follow-up (when discussed at his last visit he elected to defer).  It appears the patient was recently admitted from 12/27/2020 through 12/31/2020.  At that time noted to be noncompliant with home anticoagulation.  He presented to the ED with diaphoresis, dyspnea, and altered mental status with elevated blood pressure around 941 systolic.  After receiving nitro by EMS he became unresponsive and was intubated in route.  He went from A. fib into V. tach and required cardioversion with 150 mg amiodarone, started on Cardizem drip after that.  He underwent treatment for acute encephalopathy, lactic acidosis, leukocytosis that all resolved.  Treated for acute on chronic respiratory failure with hypoxic pulmonary edema due to acute systolic CHF and was able to be extubated on 12/28/2020.  Recommended follow-up with cardiology at discharge.  Today states doing okay overall. He continues on Apriso 1.5g daily. Also taking Florastor probiotic daily which controls his loose stools. Still with some occasional loose stools, which he states depends on what he eats (especially daily). Discussed use of lactase supplementation. Denies abdominal pain, N/V hematochezia, melena, fever, chills, unintentional weight loss. Denies URI or flu-like symptoms. Denies loss of sense of taste or smell. The patient has received COVID-19 vaccination(s). They have had a booster dose as well. Denies chest pain, dyspnea, dizziness, lightheadedness, syncope, near syncope. Denies any other upper or lower GI symptoms.  Past Medical History:  Diagnosis Date  . ARF (acute renal failure) ([place]) 04/15/2019  . Atherosclerotic heart disease of native coronary artery with other  forms of angina pectoris ([place]) 04/26/2019  . Atrial fibrillation ([place])    initial diagnoses 2012  . B12 deficiency    per patient previously taking shots  . C. difficile colitis 04/17/2019  . C. difficile diarrhea   . CAD (coronary artery  disease), native coronary artery 04/12/2007   History of MI. 2 stents. Plavix. Imdur 35m 10/28/15 cath Dr. CBurt Knack Findings: severe ostial LCx stenosis, patent LAD stent, patent dominant RCA supplies diagonal collateral.    . Chronic combined systolic and diastolic CHF (congestive heart failure) ([place] 04/15/2019  . CORONARY ARTERY DISEASE 04/12/2007   2 stents last in 2006.   .Marland KitchenCystoid macular edema of right eye 01/16/2020  . Cystoid macular edema of right eye 01/16/2020  . DIABETES MELLITUS, TYPE II 04/16/2007  . Diverticulosis of colon (without mention of hemorrhage)   . GERD (gastroesophageal reflux disease) 08/09/2011  . HYPERLIPIDEMIA 04/16/2007   reveal study. not sure of medication  . Hyperlipidemia 04/16/2007   10/24 reveal study end. Atorvastatin 250m . HYPERTENSION 04/12/2007  . HYPOTHYROIDISM 04/12/2007  . Hypothyroidism 04/12/2007   Synthroid 5034m  . Ischemic cardiomyopathy    cath 35-40%  . Morbid obesity ([place]/30/2012  . MYOCARDIAL INFARCTION, HX OF 04/12/2007  . OBESITY 06/16/2009  . Obstructive sleep apnea 04/12/2007   02/2011 - AHI 96/h CPAP 13, Lg FF     . SLEEP APNEA 04/12/2007   CPAP  . Stroke ([place] . Type II diabetes mellitus with peripheral circulatory disorder ([facility]/04/2007   Dr. GheCruzita Lederernages. Uses fructosamine.    . UMarland KitchenCERATIVE COLITIS, LEFT SIDED 11/23/2010  . Ulcerative pancolitis without complication ([place]/8413Ulcerative Colitis. Mesalamine.   . VMarland KitchenTAMIN B12 DEFICIENCY 11/29/2010    Past Surgical History:  Procedure Laterality Date  . CARDIAC CATHETERIZATION  10/2002   STENT. 2 stents Dr. BroMaurene Capes CARDIAC CATHETERIZATION N/A 10/28/2015   Procedure: Right/Left Heart Cath and Coronary Angiography;  Surgeon: MicSherren MochaD;  Location: [facility];  Service: Cardiovascular;  Laterality: N/A;  . CATARACT EXTRACTION     2021 bilateral eyes   . CORONARY STENT PLACEMENT  2008   LAD   . RIGHT/LEFT HEART CATH AND CORONARY ANGIOGRAPHY N/A 06/01/2018    Procedure: RIGHT/LEFT HEART CATH AND CORONARY ANGIOGRAPHY;  Surgeon: CooSherren MochaD;  Location: [facility];  Service: Cardiovascular;  Laterality: N/A;  . TONSILLECTOMY      Current Outpatient Medications  Medication Sig Dispense Refill  . APRISO 0.375 g 24 hr capsule Take 4 capsules (1.5 g total) by mouth daily. 120 capsule 5  . atorvastatin (LIPITOR) 20 MG tablet TAKE ONE TABLET (20MG TOTAL) BY MOUTH DAILY 90 tablet 3  . carvedilol (COREG) 25 MG tablet TAKE ONE TABLET BY MOUTH TWICE A DAY 180 tablet 3  . clopidogrel (PLAVIX) 75 MG tablet TAKE ONE TABLET (75MG TOTAL) BY MOUTH DAILY 90 tablet 3  . Coenzyme Q10 (COQ10 PO) Take 1 capsule by mouth daily with supper.    . dicyclomine (BENTYL) 10 MG capsule TAKE ONE CAPSULE (10MG TOTAL) BY MOUTH TWO TIMES DAILY AS NEEDED FOR DIARRHEA OR ABDOMINAL CRAMPING 90 capsule 3  . empagliflozin (JARDIANCE) 10 MG TABS tablet TAKE ONE (1) TABLET BY MOUTH EVERY DAY 90 tablet 3  . fish oil-omega-3 fatty acids 1000 MG capsule Take 1 g by mouth daily with supper.    . furosemide (LASIX) 40 MG tablet Take 1 tablet (40 mg total) by mouth 2 (  two) times daily. 60 tablet 0  . glucose blood test strip Use as instructed to test 4 time daily 400 each 3  . isosorbide mononitrate (IMDUR) 60 MG 24 hr tablet TAKE ONE (1) TABLET BY MOUTH EVERY DAY. Please make yearly appt with [name] for June 2022 for future refills. Thank you 1st attempt 90 tablet 0  . metFORMIN (GLUCOPHAGE) 500 MG tablet Take 1 tablet (500 mg total) by mouth 2 (two) times daily with a meal. 180 tablet 3  . potassium chloride SA (KLOR-CON) 20 MEQ tablet Take 1 tablet (20 mEq total) by mouth 2 (two) times daily for 7 days. Check your BMP in 5 to 7 days to see if you need to continue on potassium. 14 tablet 0  . PRADAXA 150 MG CAPS capsule TAKE ONE (1) CAPSULE BY MOUTH TWICE A DAY. (EVERY 12 HOURS.) 60 capsule 5  . saccharomyces boulardii (FLORASTOR) 250 MG capsule Take 1 capsule (250 mg total) by  mouth 2 (two) times daily.    . sacubitril-valsartan (ENTRESTO) 97-103 MG Take 1 tablet by mouth 2 (two) times daily. 60 tablet 0  . sitaGLIPtin (JANUVIA) 100 MG tablet TAKE ONE (1) TABLET BY MOUTH EVERY DAY 90 tablet 3  . spironolactone (ALDACTONE) 25 MG tablet TAKE ONE (1) TABLET BY MOUTH EVERY DAY 90 tablet 3  . SYNTHROID 50 MCG tablet TAKE ONE TABLET BY MOUTH DAILY BEFORE BREAKFAST 90 tablet 3   No current facility-administered medications for this visit.    Allergies as of 01/06/2021 - Review Complete 01/06/2021  Allergen Reaction Noted  . Clindamycin/lincomycin  04/26/2019    Family History  Problem Relation Age of Onset  . Heart attack Mother        mid 32s  . Heart disease Father        H/O CAD, CABG, VALVE SURGERY  . Hypertension Brother   . Obesity Brother   . Stomach cancer Maternal Aunt   . Stomach cancer Paternal Grandmother        ? colon   . Colon cancer Neg Hx     Social History   Socioeconomic History  . Marital status: Married    Spouse name: Not on file  . Number of children: 1  . Years of education: Not on file  . Highest education level: Not on file  Occupational History  . Occupation: RETIRED    Employer: RETIRED  Tobacco Use  . Smoking status: Former Smoker    Packs/day: 1.00    Years: 5.00    Pack years: 5.00    Types: Cigarettes    Quit date: 04/13/1966    Years since quitting: 54.7  . Smokeless tobacco: Never Used  Vaping Use  . Vaping Use: Never used  Substance and Sexual Activity  . Alcohol use: No    Alcohol/week: 0.0 standard drinks    Comment: no more beer  . Drug use: No  . Sexual activity: Not on file  Other Topics Concern  . Not on file  Social History Narrative   Cardiorehab 3 days a week. 45 minutes to an hour-stationary bike.    GRANDDAUGHTER (MS. 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       Married for 37 years in  2015, married previously for 14 years. Daughter with first wife and 3 grandkids.    Lives alone with wife.  Get to see grandkids a lot. New grandchild in middle of 50      Hobbies-yardwork, previously liked to hunt and fish, does some target shooting   Social Determinants of Health   Financial Resource Strain: Low Risk   . Difficulty of Paying Living Expenses: Not hard at all  Food Insecurity: No Food Insecurity  . Worried About Charity fundraiser in the Last Year: Never true  . Ran Out of Food in the Last Year: Never true  Transportation Needs: No Transportation Needs  . Lack of Transportation (Medical): No  . Lack of Transportation (Non-Medical): No  Physical Activity: Inactive  . Days of Exercise per Week: 0 days  . Minutes of Exercise per Session: 0 min  Stress: No Stress Concern Present  . Feeling of Stress : Not at all  Social Connections: Moderately Integrated  . Frequency of Communication with Friends and Family: More than three times a week  . Frequency of Social Gatherings with Friends and Family: More than three times a week  . Attends Religious Services: More than 4 times per year  . Active Member of Clubs or Organizations: No  . Attends Archivist Meetings: Never  . Marital Status: Married    Subjective: Review of Systems  Constitutional: Negative for chills, fever, malaise/fatigue and weight loss.  HENT: Negative for congestion and sore throat.   Respiratory: Negative for cough and shortness of breath.   Cardiovascular: Negative for chest pain and palpitations.  Gastrointestinal: Positive for diarrhea (improved). Negative for abdominal pain, blood in stool, heartburn, melena, nausea and vomiting.       Incontinence  Musculoskeletal: Negative for joint pain and myalgias.  Skin: Negative for rash.  Neurological: Negative for dizziness and weakness.  Endo/Heme/Allergies: Does not bruise/bleed easily.  Psychiatric/Behavioral: Negative for depression. The  patient is not nervous/anxious.   All other systems reviewed and are negative.    Objective: BP 123/77   Pulse 80   Temp (!) 97.1 F (36.2 C)   Ht 5' 7"  (1.702 m)   Wt 229 lb 12.8 oz (104.2 kg)   BMI 35.99 kg/m  Physical Exam Vitals and nursing note reviewed.  Constitutional:      General: He is not in acute distress.    Appearance: Normal appearance. He is obese. He is not ill-appearing, toxic-appearing or diaphoretic.  HENT:     Head: Normocephalic and atraumatic.     Nose: No congestion or rhinorrhea.  Eyes:     General: No scleral icterus. Cardiovascular:     Rate and Rhythm: Normal rate and regular rhythm.     Heart sounds: Normal heart sounds.  Pulmonary:     Effort: Pulmonary effort is normal.     Breath sounds: Normal breath sounds.  Abdominal:     General: Bowel sounds are normal. There is no distension.     Palpations: Abdomen is soft. There is no hepatomegaly, splenomegaly or mass.     Tenderness: There is no abdominal tenderness. There is no guarding or rebound.     Hernia: No hernia is present.  Musculoskeletal:     Cervical back: Neck supple.  Skin:    General: Skin is warm and dry.     Coloration: Skin is not jaundiced.     Findings: No bruising or rash.  Neurological:     General: No focal deficit present.     Mental Status: He is alert and oriented to person, place, and time. Mental status is at baseline.  Psychiatric:        Mood and Affect: Mood normal.        Behavior: Behavior normal.        Thought Content: Thought content normal.      Assessment:  Very pleasant 75 year old male presents for follow-up on ulcerative colitis, IBS, diarrhea, incontinence.  Overall he is doing quite well.  No red flag/warning signs or symptoms.  Noted recent hospitalization due to exacerbation of CHF requiring intubation and diuresis.  Overall he is doing better.  He has a follow-up with the heart failure clinic tomorrow.  Ulcerative colitis: Continues to be  well managed on Apriso 1.5 g daily.  He states he temporarily stopped taking this because he thought maybe this could be causing some issues, but has since restarted.  We discussed need to stay on medication for ulcerative colitis to prevent severe complications down the road including but not limited to ulcerations, bleeding, possible need for colorectal surgery.  He verbalized understanding.  Generally without abdominal pain, rashes, mouth sores.  No concerns for flare of disease.  Continue diarrhea, likely due to his IBS overlay and lactose intolerance.  He just had CBC while hospitalized so no need to recheck.  However, he is requesting to have metabolic panel checked to evaluate his potassium which was low during hospitalization.  I will check a CMP as we can get a set of liver enzymes as well.  Diarrhea: History of likely IBS overlay and lactose intolerance.  He states he does occasionally have loose stools but this is usually when he eats dairy.  Discussed use of lactase supplement.  He states he will try this, his daughter has some at home and will give him some to try.  Otherwise, Florastor probiotic and Bentyl seem to be working well for him.  Recommend he continue his current medications.  Due to some incontinence I will give him a prescription for depends adult briefs to see if he can get these covered for him.   Plan: 1. Continue Apriso daily 2. CMP 3. Follow-up in 6 months 4. Start using lactase supplement with dairy products    Thank you for allowing [place] to participate in the care of Toniann Ket, DNP, AGNP-C Adult & Gerontological Nurse Practitioner [facility]   01/06/2021 3:30 PM   Disclaimer: This note was dictated with voice recognition software. Similar sounding words can inadvertently be transcribed and may not be corrected upon review.

## 2021-01-06 NOTE — Patient Instructions (Addendum)
Your health issues we discussed today were:   Ulcerative colitis: 1. Glad you are doing well! 2. Continue take Apriso daily 3. Call us if you have any concerning symptoms abdominal pain, rectal bleeding, worsening rashes, sores in the mouth, etc.  Diarrhea with incontinence: 1. I am glad Florastor and Bentyl help your symptoms! 2. I will give you a prescription for depends adult briefs to help with the incontinence 3. Call us if you have any worsening diarrhea 4. As we discussed, start taking lactase supplement (i.e. Lactaid or store brand version) with all dairy products  Overall I recommend:  1. Continue other current medications 2. Return for follow-up in 6 months 3. Call for any questions or concerns 4. I have put in an order for your electrolytes, kidney function, liver function to be drawn at the lab   ---------------------------------------------------------------  I am glad you have gotten your COVID-19 vaccination!  Even though you are fully vaccinated you should continue to follow CDC and state/local guidelines.  ---------------------------------------------------------------   At [facility] we value your feedback. You may receive a survey about your visit today. Please share your experience as we strive to create trusting relationships with our patients to provide genuine, compassionate, quality care.  We appreciate your understanding and patience as we review any laboratory studies, imaging, and other diagnostic tests that are ordered as we care for you. Our office policy is 5 business days for review of these results, and any emergent or urgent results are addressed in a timely manner for your best interest. If you do not hear from our office in 1 week, please contact us.   We also encourage the use of MyChart, which contains your medical information for your review as well. If you are not enrolled in this feature, an access code is on this after visit  summary for your convenience. Thank you for allowing [place] to be involved in your care.  It was great to see you today!  I hope you have a great spring!!

## 2021-01-06 NOTE — Patient Instructions (Addendum)
Please continue:  - Metformin 500 mg 2x a day - Januvia 100 mg daily  - Jardiance 10 mg before breakfast every day  Please return in 6 months with your sugar log.

## 2021-01-06 NOTE — Progress Notes (Addendum)
Advanced Heart Failure Clinic Note   Referring Physician: [name] PCP: Marin Olp, MD PCP-Cardiologist: Sherren Mocha, MD   HPI:  [name] is a 75 y.o. male with h/o CAD s/p DES to LAD 2004, STEMI 2008 2/2 very late stent thombosis, Chronic Afib, Chronic combined CHF, OSA on CPAP, morbid obesity, HTN, HLD, and Type 2 DM. Referred by Dr. Marlou Porch for post hospital f/u.   Saw [name] 04/09/18 with worsening dyspnea. Spiro increased. BNP elevated. Echo repeated which showed worsening EF 40-45% -> 25-30%.  Cath 06/01/18  Stable CAD.  RA 9 RV 35/10 PA 44/14 (25) Ao 199/76 (93) LV 124/16 Fick 4.7/2.0    We have not seen him in HF Clinic since 2019. Admitted to the hospital this month with ADHF in setting of HTN crisis and not taking his medicine. Seen by [facility] Echo with EF 50%. Diuresed.   Here today with his daughter. Says he feels good. Weighing himself daily. No CP, orthopnea or PND.    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 Former smoker (quit 1967). No drug use. Lives with wife.   LHC 2017 - Patent stent in LAD, severe ostial stenosis of LCx not amenable to PCI, patent, non-dominant RCA supplying collaterals to the diagonal branch to the LAD.   Echo 09/29/2016 LVEF 40-45%, trivial MR, Mod LAE,  Echo 04/25/2018 LVEF 25-30%, Grade 3 DD, Mild MR, Severe LAE, Mod RV dilation, PA peak pressure 48 mm Hg. WMA noted with Akinesis of the mid-apical anterior, mid anteroseptal, apical lateral, and apical myocardium; hypokinesis of the mid myocardium; moderate hypokinesis of the basal anteroseptal, basal-mid inferoseptal, mid-apical inferior, and apical septal myocardium.   Past Medical History:  Diagnosis Date  . ARF (acute renal failure) ([place]) 04/15/2019  . Atherosclerotic heart disease of native coronary artery with other forms of angina pectoris ([place]) 04/26/2019  . Atrial fibrillation ([place])    initial diagnoses 2012  . B12 deficiency    per patient previously  taking shots  . C. difficile colitis 04/17/2019  . C. difficile diarrhea   . CAD (coronary artery disease), native coronary artery 04/12/2007   History of MI. 2 stents. Plavix. Imdur 66m 10/28/15 cath Dr. CBurt Knack Findings: severe ostial LCx stenosis, patent LAD stent, patent dominant RCA supplies diagonal collateral.    . Chronic combined systolic and diastolic CHF (congestive heart failure) ([place] 04/15/2019  . CORONARY ARTERY DISEASE 04/12/2007   2 stents last in 2006.   .Marland KitchenCystoid macular edema of right eye 01/16/2020  . Cystoid macular edema of right eye 01/16/2020  . DIABETES MELLITUS, TYPE II 04/16/2007  . Diverticulosis of colon (without mention of hemorrhage)   . GERD (gastroesophageal reflux disease) 08/09/2011  . HYPERLIPIDEMIA 04/16/2007   reveal study. not sure of medication  . Hyperlipidemia 04/16/2007   10/24 reveal study end. Atorvastatin 243m . HYPERTENSION 04/12/2007  . HYPOTHYROIDISM 04/12/2007  . Hypothyroidism 04/12/2007   Synthroid 5031m  . Ischemic cardiomyopathy    cath 35-40%  . Morbid obesity ([place]CC[place]0/30/2012  . MYOCARDIAL INFARCTION, HX OF 04/12/2007  . OBESITY 06/16/2009  . Obstructive sleep apnea 04/12/2007   02/2011 - AHI 96/h CPAP 13, Lg FF     . SLEEP APNEA 04/12/2007   CPAP  . Stroke ([place] . Type II diabetes mellitus with peripheral circulatory disorder ([facility]/04/2007   Dr. GheCruzita Lederernages. Uses fructosamine.    . UMarland KitchenCERATIVE COLITIS, LEFT SIDED 11/23/2010  . Ulcerative pancolitis without complication (  [place]) 08/09/2011   Ulcerative Colitis. Mesalamine.   Marland Kitchen VITAMIN B12 DEFICIENCY 11/29/2010    Current Outpatient Medications  Medication Sig Dispense Refill  . APRISO 0.375 g 24 hr capsule Take 4 capsules (1.5 g total) by mouth daily. 120 capsule 5  . atorvastatin (LIPITOR) 20 MG tablet TAKE ONE TABLET (20MG TOTAL) BY MOUTH DAILY 90 tablet 3  . carvedilol (COREG) 25 MG tablet TAKE ONE TABLET BY MOUTH TWICE A DAY 180 tablet 3  . clopidogrel (PLAVIX) 75 MG tablet TAKE ONE TABLET  (75MG TOTAL) BY MOUTH DAILY 90 tablet 3  . Coenzyme Q10 (COQ10 PO) Take 1 capsule by mouth daily with supper.    . dicyclomine (BENTYL) 10 MG capsule TAKE ONE CAPSULE (10MG TOTAL) BY MOUTH TWO TIMES DAILY AS NEEDED FOR DIARRHEA OR ABDOMINAL CRAMPING 90 capsule 3  . empagliflozin (JARDIANCE) 10 MG TABS tablet TAKE ONE (1) TABLET BY MOUTH EVERY DAY 90 tablet 3  . fish oil-omega-3 fatty acids 1000 MG capsule Take 1 g by mouth daily with supper.    . furosemide (LASIX) 40 MG tablet Take 1 tablet (40 mg total) by mouth 2 (two) times daily. 60 tablet 0  . glucose blood test strip Use as instructed to test 4 time daily 400 each 3  . isosorbide mononitrate (IMDUR) 60 MG 24 hr tablet TAKE ONE (1) TABLET BY MOUTH EVERY DAY. Please make yearly appt with [name] for June 2022 for future refills. Thank you 1st attempt 90 tablet 0  . metFORMIN (GLUCOPHAGE) 500 MG tablet Take 1 tablet (500 mg total) by mouth 2 (two) times daily with a meal. 180 tablet 3  . potassium chloride SA (KLOR-CON) 20 MEQ tablet Take 1 tablet (20 mEq total) by mouth 2 (two) times daily for 7 days. Check your BMP in 5 to 7 days to see if you need to continue on potassium. 14 tablet 0  . PRADAXA 150 MG CAPS capsule TAKE ONE (1) CAPSULE BY MOUTH TWICE A DAY. (EVERY 12 HOURS.) 60 capsule 5  . saccharomyces boulardii (FLORASTOR) 250 MG capsule Take 1 capsule (250 mg total) by mouth 2 (two) times daily.    . sacubitril-valsartan (ENTRESTO) 97-103 MG Take 1 tablet by mouth 2 (two) times daily. 60 tablet 0  . sitaGLIPtin (JANUVIA) 100 MG tablet TAKE ONE (1) TABLET BY MOUTH EVERY DAY 90 tablet 3  . spironolactone (ALDACTONE) 25 MG tablet TAKE ONE (1) TABLET BY MOUTH EVERY DAY 90 tablet 3  . SYNTHROID 50 MCG tablet TAKE ONE TABLET BY MOUTH DAILY BEFORE BREAKFAST 90 tablet 3   No current facility-administered medications for this encounter.    Allergies  Allergen Reactions  . Clindamycin/Lincomycin     Developed C. difficile colitis 1 month  after use      Social History   Socioeconomic History  . Marital status: Married    Spouse name: Not on file  . Number of children: 1  . Years of education: Not on file  . Highest education level: Not on file  Occupational History  . Occupation: RETIRED    Employer: RETIRED  Tobacco Use  . Smoking status: Former Smoker    Packs/day: 1.00    Years: 5.00    Pack years: 5.00    Types: Cigarettes    Quit date: 04/13/1966    Years since quitting: 54.7  . Smokeless tobacco: Never Used  Vaping Use  . Vaping Use: Never used  Substance and Sexual Activity  . Alcohol use: No  Alcohol/week: 0.0 standard drinks    Comment: no more beer  . Drug use: No  . Sexual activity: Not on file  Other Topics Concern  . Not on file  Social History Narrative   Cardiorehab 3 days a week. 45 minutes to an hour-stationary bike.    GRANDDAUGHTER (MS. 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       Married for 37 years in 2015, married previously for 14 years. Daughter with first wife and 3 grandkids.    Lives alone with wife. Get to see grandkids a lot. New grandchild in middle of 11      Hobbies-yardwork, previously liked to hunt and fish, does some target shooting   Social Determinants of Health   Financial Resource Strain: Low Risk   . Difficulty of Paying Living Expenses: Not hard at all  Food Insecurity: No Food Insecurity  . Worried About Charity fundraiser in the Last Year: Never true  . Ran Out of Food in the Last Year: Never true  Transportation Needs: No Transportation Needs  . Lack of Transportation (Medical): No  . Lack of Transportation (Non-Medical): No  Physical Activity: Inactive  . Days of Exercise per Week: 0 days  . Minutes of Exercise per Session: 0 min  Stress: No Stress Concern Present  . Feeling of Stress : Not at all  Social Connections: Moderately Integrated  .  Frequency of Communication with Friends and Family: More than three times a week  . Frequency of Social Gatherings with Friends and Family: More than three times a week  . Attends Religious Services: More than 4 times per year  . Active Member of Clubs or Organizations: No  . Attends Archivist Meetings: Never  . Marital Status: Married  Human resources officer Violence: Not At Risk  . Fear of Current or Ex-Partner: No  . Emotionally Abused: No  . Physically Abused: No  . Sexually Abused: No      Family History  Problem Relation Age of Onset  . Heart attack Mother        mid 4s  . Heart disease Father        H/O CAD, CABG, VALVE SURGERY  . Hypertension Brother   . Obesity Brother   . Stomach cancer Maternal Aunt   . Stomach cancer Paternal Grandmother        ? colon   . Colon cancer Neg Hx     Vitals:   01/07/21 1506  BP: 122/78  Pulse: 70  SpO2: 97%  Weight: 104.8 kg (231 lb)    Wt Readings from Last 3 Encounters:  01/07/21 104.8 kg (231 lb)  01/06/21 104.2 kg (229 lb 12.8 oz)  01/06/21 103.8 kg (228 lb 12.8 oz)     PHYSICAL EXAM: General:  Well appearing. No resp difficulty HEENT: normal Neck: supple. no JVD. Carotids 2+ bilat; no bruits. No lymphadenopathy or thryomegaly appreciated. Cor: PMI nondisplaced. Irregular rate & rhythm. No rubs, gallops or murmurs. Lungs: clear Abdomen: obese soft, nontender, nondistended. No hepatosplenomegaly. No bruits or masses. Good bowel sounds. Extremities: no cyanosis, clubbing, rash, edema Neuro: alert & orientedx3, cranial nerves grossly intact. moves all 4 extremities w/o difficulty. Affect pleasant   ASSESSMENT & PLAN:  1. Chronic combined CHF, ICM - Echo 04/25/2018 LVEF 25-30%, Grade 3 DD, Mild MR, Severe LAE, Mod RV dilation,  PA peak pressure 48 mm Hg. WMA noted - Echo 12/19 EF 25-30%.  - Echo 3/22 EF 50-55%  - Admitted 3/22 for ADHF in setting of HTN crisis - NYHA II symptoms - Volume status stable on exam  on lasix 40 bid  - Reinforced need for daily weights and reviewed use of sliding scale diuretics. - Continue Entresto 97/103 mg BID  - Continue coreg 25 mg BID - Continue spironolactone 25 mg daily - Continue Jardiance 10  2. CAD - LHC 2017 with circumflex stenosis unsuitable for PCI, continued patency of the LAD stent and total occlusion of the first diagonal with collaterals from RCA, widely patent RCA - Cath 8/19 with stable anatomy    No s/s ischemia - Continue statin and plavix. Not on ASA with Pradaxa.   3. Chronic Atrial Fibrillation - Chronic, rate controlled. - On Pradaxa for AC. No bleeding - This patients CHA2DS2-VASc is at least 5.  4. Essential HTN  - Blood pressure well controlled. Continue current regimen.  5. OSA on CPAP - Continue nightly use.   6. Morbid Obesity - Body mass index is 36.18 kg/m.  - Encouraged weight loss.   7. DM2 - Per PCP.  - Continue Lottie Rater, MD 01/07/21

## 2021-01-06 NOTE — Progress Notes (Signed)
Patient ID: [name], male   DOB: [birth year], 75 y.o.   MRN: [id number]  This visit occurred during the SARS-CoV-2 public health emergency.  Safety protocols were in place, including screening questions prior to the visit, additional usage of staff PPE, and extensive cleaning of exam room while observing appropriate contact time as indicated for disinfecting solutions.   HPI: [name] is a 75 y.o.-year-old male, returning for DM2, dx 2004, non-insulin-dependent, uncontrolled, with complications (CAD, s/p MI). Last visit 6 months ago. He has [facility] as supplemental and [employer] for meds in 10/2015.  Interim history: He was recently admitted between 3/20-24/2022 for hypoxemic respiratory failure in the setting of CHF after not taking his Lasix for a few weeks due to increased urination.  Per review of his hospitalization records, he was unresponsive, had A. fib with RVR at that time and a glucose level at 260.  He had to be intubated.  However, he now recovered well.  He has good appetite and no complaints at today's visit sugars improved after discharge.  Reviewed HbA1c levels: Lab Results  Component Value Date   HGBA1C 5.9 (H) 12/27/2020   HGBA1C 6.3 (A) 07/08/2020   HGBA1C 6.8 (H) 02/24/2020  02/27/2019: HbA1c calculated from fructosamine is better: 6.45%. 10/24/2018: HbA1c calculated from fructosamine is slightly higher than the one from 02/2018, at 6.6%. 02/13/2018: The HbA1c calculated from the fructosamine is excellent, at 6.3%! 10/16/2017: HbA1c calculated from fructosamine is: 7.3% (higher). 06/16/2017: HbA1c calculated from the fructosamine is stable, at 6.8%.  12/08/2016: HbA1c calculated from the fructosamine is better, at 6.86%. 08/10/2016: HbA1c calculated from  fructosamine is 7.88% 05/10/2016: HbA1c calculated from fructosamine is 7.6% 01/06/2016: HbA1c calculated from fructosamine is 6.6% 09/06/2016: HbA1c calculated from fructosamine is 7.0% 06/01/2015: HbA1c  calculated from fructosamine is 7.0% 02/23/2015: HbA1c calculated from fructosamine is 6.88% 10/15/2014: HbA1c calculated from fructosamine is 7.17% He started to change his eating habits since 03/2013.   He is on: -  >> stopped at the time of his recent hospitalization patient - Metformin 1000 mg 2x a day >> 500 mg 2x a day  - Januvia 100 mg daily  - Jardiance 10 mg in a.m. every other day due to increased urination >> now every day starting 02/2019 He does not miss doses.  Pt checks his sugars 4 times a day per his excellent log: - am:  92-118 >> 101-130 >> 119-134, 137 >> 112-136 - 2h after breakfast: 130-164 >> 144-164 >> 146-164 - before lunch 144 >> 110-120s >> n/c   - before dinner:  97-117 >> 97-122 >> 110-123 >> 112-127 - 2h after dinner: 133-147 >> 141-159 >> 107, 146-159 He has hypoglycemia awareness in the 60s. Highest: 287 >> .Marland KitchenMarland Kitchen 151 >> 164 >> 164 >> 164  His wife is a Radiographer, therapeutic >> he does eat sweets. He saw [name] (nutritionist).  -+ Mild CKD, last BUN/creatinine:  Lab Results  Component Value Date   BUN 25 (H) 12/31/2020   CREATININE 1.40 (H) 12/31/2020  On Entresto.  -+ HL:  last set of lipids: Lab Results  Component Value Date   CHOL 84 02/24/2020   HDL 51.60 02/24/2020   LDLCALC 12 02/24/2020   LDLDIRECT 18.0 06/13/2017   TRIG 29 12/28/2020   CHOLHDL 2 02/24/2020  He was in the Reveal Study x 4 years >> stopped. On Lipitor 20 and omega-3 fatty acids.  - last eye exam was 06/30/2020: + Moderate NPDR OU without diabetes associated macular  edema, but he has cystoid macular edema OD -Dr. Zadie Rhine.  He has a history of cataract surgery  - no numbness and tingling in his feet.  He is compliant with CPAP for OSA, also has A. Fib-on Plavix and pradaxa, IBD -ulcerative pancolitis, GERD, also, obesity.  In 2019 she was also diagnosed with CHF -EF of 25 to 30%.  His Lasix was increased to twice a day(takes the second dose as needed). Cardiac cath >> no  significant obstruction.  He was started on Entresto.  He has a history of very low vitamin B12: Lab Results  Component Value Date   VITAMINB12 1,256 (H) 09/02/2020   VITAMINB12 103 (L) 02/24/2020   VITAMINB12 267 05/01/2012   VITAMINB12 201 (L) 11/23/2010   She was started on B12 injections q. weekly for 4 weeks then 1000 mcg p.o. daily by PCP  Hypothyroidism:  Pt is on levothyroxine 50 mcg daily, taken: - in am - fasting - at least 30 min from b'fast - no Ca, Fe, MVI, PPIs - not on Biotin  Latest TSH was reviewed and this was normal: Lab Results  Component Value Date   TSH 2.036 12/27/2020   Pt denies: - feeling nodules in neck - hoarseness - dysphagia - choking - SOB with lying down  ROS: Constitutional: no weight gain/+ weight loss, no fatigue, no subjective hyperthermia, no subjective hypothermia Eyes: no blurry vision, no xerophthalmia ENT: no sore throat, + see HPI Cardiovascular: no CP/no SOB/no palpitations/no leg swelling Respiratory: no cough/no SOB/no wheezing Gastrointestinal: no N/no V/no D/no C/no acid reflux Musculoskeletal: no muscle aches/no joint aches Skin: no rashes, no hair loss Neurological: no tremors/no numbness/no tingling/no dizziness  I reviewed pt's medications, allergies, PMH, social hx, family hx, and changes were documented in the history of present illness. Otherwise, unchanged from my initial visit note.  Past Medical History:  Diagnosis Date  . ARF (acute renal failure) ([place]) 04/15/2019  . Atherosclerotic heart disease of native coronary artery with other forms of angina pectoris ([place]) 04/26/2019  . Atrial fibrillation ([place])    initial diagnoses 2012  . B12 deficiency    per patient previously taking shots  . C. difficile colitis 04/17/2019  . C. difficile diarrhea   . CAD (coronary artery disease), native coronary artery 04/12/2007   History of MI. 2 stents. Plavix. Imdur 25m 10/28/15 cath Dr. CBurt Knack Findings: severe ostial LCx  stenosis, patent LAD stent, patent dominant RCA supplies diagonal collateral.    . Chronic combined systolic and diastolic CHF (congestive heart failure) ([place] 04/15/2019  . CORONARY ARTERY DISEASE 04/12/2007   2 stents last in 2006.   .Marland KitchenCystoid macular edema of right eye 01/16/2020  . Cystoid macular edema of right eye 01/16/2020  . DIABETES MELLITUS, TYPE II 04/16/2007  . Diverticulosis of colon (without mention of hemorrhage)   . GERD (gastroesophageal reflux disease) 08/09/2011  . HYPERLIPIDEMIA 04/16/2007   reveal study. not sure of medication  . Hyperlipidemia 04/16/2007   10/24 reveal study end. Atorvastatin 244m . HYPERTENSION 04/12/2007  . HYPOTHYROIDISM 04/12/2007  . Hypothyroidism 04/12/2007   Synthroid 5038m  . Ischemic cardiomyopathy    cath 35-40%  . Morbid obesity ([facility]/30/2012  . MYOCARDIAL INFARCTION, HX OF 04/12/2007  . OBESITY 06/16/2009  . Obstructive sleep apnea 04/12/2007   02/2011 - AHI 96/h CPAP 13, Lg FF     . SLEEP APNEA 04/12/2007   CPAP  . Stroke ([facility] . Type II diabetes mellitus with peripheral  circulatory disorder ([place]) 04/16/2007   Dr. Cruzita Lederer manages. Uses fructosamine.    Marland Kitchen ULCERATIVE COLITIS, LEFT SIDED 11/23/2010  . Ulcerative pancolitis without complication ([place]) 10/12/7251   Ulcerative Colitis. Mesalamine.   Marland Kitchen VITAMIN B12 DEFICIENCY 11/29/2010   Past Surgical History:  Procedure Laterality Date  . CARDIAC CATHETERIZATION  10/2002   STENT. 2 stents Dr. Maurene Capes  . CARDIAC CATHETERIZATION N/A 10/28/2015   Procedure: Right/Left Heart Cath and Coronary Angiography;  Surgeon: Sherren Mocha, MD;  Location: [place] [facility];  Service: Cardiovascular;  Laterality: N/A;  . CATARACT EXTRACTION     2021 bilateral eyes   . CORONARY STENT PLACEMENT  2008   LAD   . RIGHT/LEFT HEART CATH AND CORONARY ANGIOGRAPHY N/A 06/01/2018   Procedure: RIGHT/LEFT HEART CATH AND CORONARY ANGIOGRAPHY;  Surgeon: Sherren Mocha, MD;  Location: [place] [facility];  Service: Cardiovascular;   Laterality: N/A;  . TONSILLECTOMY     Social History   Socioeconomic History  . Marital status: Married    Spouse name: Not on file  . Number of children: 1  . Years of education: Not on file  . Highest education level: Not on file  Occupational History  . Occupation: RETIRED    Employer: RETIRED  Tobacco Use  . Smoking status: Former Smoker    Packs/day: 1.00    Years: 5.00    Pack years: 5.00    Types: Cigarettes    Quit date: 04/13/1966    Years since quitting: 54.7  . Smokeless tobacco: Never Used  Vaping Use  . Vaping Use: Never used  Substance and Sexual Activity  . Alcohol use: No    Alcohol/week: 0.0 standard drinks    Comment: no more beer  . Drug use: No  . Sexual activity: Not on file  Other Topics Concern  . Not on file  Social History Narrative   Cardiorehab 3 days a week. 45 minutes to an hour-stationary bike.    GRANDDAUGHTER (MS. 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       Married for 37 years in 2015, married previously for 14 years. Daughter with first wife and 3 grandkids.    Lives alone with wife. Get to see grandkids a lot. New grandchild in middle of 86      Hobbies-yardwork, previously liked to hunt and fish, does some target shooting   Social Determinants of Health   Financial Resource Strain: Low Risk   . Difficulty of Paying Living Expenses: Not hard at all  Food Insecurity: No Food Insecurity  . Worried About Charity fundraiser in the Last Year: Never true  . Ran Out of Food in the Last Year: Never true  Transportation Needs: No Transportation Needs  . Lack of Transportation (Medical): No  . Lack of Transportation (Non-Medical): No  Physical Activity: Inactive  . Days of Exercise per Week: 0 days  . Minutes of Exercise per Session: 0 min  Stress: No Stress Concern Present  . Feeling of Stress : Not at all  Social Connections:  Moderately Integrated  . Frequency of Communication with Friends and Family: More than three times a week  . Frequency of Social Gatherings with Friends and Family: More than three times a week  . Attends Religious Services: More than 4 times per year  . Active Member of Clubs or Organizations: No  .  Attends Archivist Meetings: Never  . Marital Status: Married  Human resources officer Violence: Not At Risk  . Fear of Current or Ex-Partner: No  . Emotionally Abused: No  . Physically Abused: No  . Sexually Abused: No   Current Outpatient Medications on File Prior to Visit  Medication Sig Dispense Refill  . APRISO 0.375 g 24 hr capsule Take 4 capsules (1.5 g total) by mouth daily. 120 capsule 5  . atorvastatin (LIPITOR) 20 MG tablet TAKE ONE TABLET (20MG TOTAL) BY MOUTH DAILY 90 tablet 3  . carvedilol (COREG) 25 MG tablet TAKE ONE TABLET BY MOUTH TWICE A DAY 180 tablet 3  . clopidogrel (PLAVIX) 75 MG tablet TAKE ONE TABLET (75MG TOTAL) BY MOUTH DAILY 90 tablet 3  . Coenzyme Q10 (COQ10 PO) Take 1 capsule by mouth daily with supper.    . dicyclomine (BENTYL) 10 MG capsule TAKE ONE CAPSULE (10MG TOTAL) BY MOUTH TWO TIMES DAILY AS NEEDED FOR DIARRHEA OR ABDOMINAL CRAMPING 90 capsule 3  . empagliflozin (JARDIANCE) 10 MG TABS tablet TAKE ONE (1) TABLET BY MOUTH EVERY DAY 90 tablet 3  . fish oil-omega-3 fatty acids 1000 MG capsule Take 1 g by mouth daily with supper.    . furosemide (LASIX) 40 MG tablet Take 1 tablet (40 mg total) by mouth 2 (two) times daily. 60 tablet 0  . glucose blood test strip Use as instructed to test 4 time daily 400 each 3  . isosorbide mononitrate (IMDUR) 60 MG 24 hr tablet TAKE ONE (1) TABLET BY MOUTH EVERY DAY. Please make yearly appt with [name] for June 2022 for future refills. Thank you 1st attempt 90 tablet 0  . potassium chloride SA (KLOR-CON) 20 MEQ tablet Take 1 tablet (20 mEq total) by mouth 2 (two) times daily for 7 days. Check your BMP in 5 to 7 days to  see if you need to continue on potassium. 14 tablet 0  . PRADAXA 150 MG CAPS capsule TAKE ONE (1) CAPSULE BY MOUTH TWICE A DAY. (EVERY 12 HOURS.) 60 capsule 5  . saccharomyces boulardii (FLORASTOR) 250 MG capsule Take 1 capsule (250 mg total) by mouth 2 (two) times daily.    . sacubitril-valsartan (ENTRESTO) 97-103 MG Take 1 tablet by mouth 2 (two) times daily. 60 tablet 0  . sitaGLIPtin (JANUVIA) 100 MG tablet TAKE ONE (1) TABLET BY MOUTH EVERY DAY 90 tablet 0  . spironolactone (ALDACTONE) 25 MG tablet TAKE ONE (1) TABLET BY MOUTH EVERY DAY 90 tablet 3  . SYNTHROID 50 MCG tablet TAKE ONE TABLET BY MOUTH DAILY BEFORE BREAKFAST 90 tablet 3   No current facility-administered medications on file prior to visit.   Allergies  Allergen Reactions  . Clindamycin/Lincomycin     Developed C. difficile colitis 1 month after use   Family History  Problem Relation Age of Onset  . Heart attack Mother        mid 53s  . Heart disease Father        H/O CAD, CABG, VALVE SURGERY  . Hypertension Brother   . Obesity Brother   . Stomach cancer Maternal Aunt   . Stomach cancer Paternal Grandmother        ? colon   . Colon cancer Neg Hx     PE: BP 128/82 (BP Location: Right Arm, Patient Position: Sitting, Cuff Size: Normal)   Pulse 68   Ht 5' 8"  (1.727 m)   Wt 228 lb 12.8 oz (103.8 kg)   SpO2  99%   BMI 34.79 kg/m  Body mass index is 34.79 kg/m.  Wt Readings from Last 3 Encounters:  01/06/21 228 lb 12.8 oz (103.8 kg)  12/31/20 225 lb 14.4 oz (102.5 kg)  11/06/20 246 lb 3.2 oz (111.7 kg)   Constitutional: overweight, in NAD Eyes: PERRLA, EOMI, no exophthalmos ENT: moist mucous membranes, no thyromegaly, no cervical lymphadenopathy Cardiovascular: RRR, No MRG, + B LE edema L>R Respiratory: CTA B Gastrointestinal: abdomen soft, NT, ND, BS+ Musculoskeletal: no deformities, strength intact in all 4 Skin: moist, warm, no rashes Neurological: no tremor with outstretched hands, DTR normal in all  4  ASSESSMENT: 1. DM2, non-insulin-dependent, uncontrolled, with complications - CAD, s/p MI 10/2006 - s/p stent - sees [name]  2. Obesity class 3 BMI Classification:  < 18.5 underweight   18.5-24.9 normal weight   25.0-29.9 overweight   30.0-34.9 class I obesity   35.0-39.9 class II obesity   ? 40.0 class III obesity   3. HL  4.  Hypothyroidism  PLAN:  1. Patient with longstanding, uncontrolled, type 2 diabetes, on oral antidiabetic regimen with Metformin, DPP) SGLT2 inhibitor. He decreased the dose of Metformin after losing weight following his C. difficile infection in 2020.  We did not have to increase it back. -At last visit, sugars were at goal per review of his excellent log.  He occasionally had some higher blood sugars, above goal, mostly after dietary indiscretions but no frank hyper or hypoglycemia.  We did not change the regimen at that time.  HbA1c was excellent, at 6.3%. -Since then, while in the hospital (please see HPI) he was advised to stop glipizide upon discharge.  He consulted with me and I concurred that he did not need any sulfonylurea after discharge.  Of note, he lost 18 pounds since then. -At today's visit, sugars almost all at goal, with only one hypoglycemic exception in the last months.  They have even improved after his hospitalization despite not using glipizide.  We do not need to change his regimen for now but I did advise him to let me know if sugars start increasing.  In that case, will change Januvia to a GLP-1 receptor agonist. - I advised him to Patient Instructions  Please continue:  - Metformin 500 mg 2x a day - Januvia 100 mg daily  - Jardiance 10 mg before breakfast every day  Please return in 6 months with your sugar log.   - advised to check sugars at different times of the day - 1x a day, rotating check times - advised for yearly eye exams >> he is UTD - return to clinic in 6 months  2. HL -Reviewed latest lipid panel from  02/2020: All fractions at goal Lab Results  Component Value Date   CHOL 84 02/24/2020   HDL 51.60 02/24/2020   LDLCALC 12 02/24/2020   LDLDIRECT 18.0 06/13/2017   TRIG 29 12/28/2020   CHOLHDL 2 02/24/2020  -Continues Lipitor 20 and omega-3 fatty acids without side effects  3.  Hypothyroidism - latest thyroid labs reviewed with pt >> normal: Lab Results  Component Value Date   TSH 2.036 12/27/2020   - he continues on LT4 50 mcg daily - pt feels good on this dose. - we discussed about taking the thyroid hormone every day, with water, >30 minutes before breakfast, separated by >4 hours from acid reflux medications, calcium, iron, multivitamins. Pt. is taking it correctly.  Philemon Kingdom, MD PhD [facility] Endocrinology

## 2021-01-07 ENCOUNTER — Encounter (HOSPITAL_COMMUNITY): Payer: Self-pay | Admitting: Internal Medicine

## 2021-01-07 ENCOUNTER — Ambulatory Visit (HOSPITAL_COMMUNITY)
Admission: RE | Admit: 2021-01-07 | Discharge: 2021-01-07 | Disposition: A | Discharge status: Home / Self Care | Payer: Medicare Other | Source: Ambulatory Visit | Attending: Internal Medicine | Admitting: Internal Medicine

## 2021-01-07 ENCOUNTER — Other Ambulatory Visit (HOSPITAL_COMMUNITY): Payer: Medicare Other

## 2021-01-07 VITALS — BP 122/78 | HR 70 | Wt 231.0 lb

## 2021-01-07 DIAGNOSIS — I482 Chronic atrial fibrillation, unspecified: Secondary | ICD-10-CM | POA: Insufficient documentation

## 2021-01-07 DIAGNOSIS — Z87891 Personal history of nicotine dependence: Secondary | ICD-10-CM | POA: Diagnosis not present

## 2021-01-07 DIAGNOSIS — Z79899 Other long term (current) drug therapy: Secondary | ICD-10-CM | POA: Diagnosis not present

## 2021-01-07 DIAGNOSIS — I2510 Atherosclerotic heart disease of native coronary artery without angina pectoris: Secondary | ICD-10-CM | POA: Diagnosis not present

## 2021-01-07 DIAGNOSIS — I5022 Chronic systolic (congestive) heart failure: Secondary | ICD-10-CM | POA: Diagnosis not present

## 2021-01-07 DIAGNOSIS — E119 Type 2 diabetes mellitus without complications: Secondary | ICD-10-CM | POA: Diagnosis not present

## 2021-01-07 DIAGNOSIS — Z7901 Long term (current) use of anticoagulants: Secondary | ICD-10-CM | POA: Diagnosis not present

## 2021-01-07 DIAGNOSIS — I5042 Chronic combined systolic (congestive) and diastolic (congestive) heart failure: Secondary | ICD-10-CM | POA: Diagnosis not present

## 2021-01-07 DIAGNOSIS — E785 Hyperlipidemia, unspecified: Secondary | ICD-10-CM | POA: Insufficient documentation

## 2021-01-07 DIAGNOSIS — Z9989 Dependence on other enabling machines and devices: Secondary | ICD-10-CM | POA: Diagnosis not present

## 2021-01-07 DIAGNOSIS — Z79890 Hormone replacement therapy (postmenopausal): Secondary | ICD-10-CM | POA: Diagnosis not present

## 2021-01-07 DIAGNOSIS — Z881 Allergy status to other antibiotic agents status: Secondary | ICD-10-CM | POA: Diagnosis not present

## 2021-01-07 DIAGNOSIS — I252 Old myocardial infarction: Secondary | ICD-10-CM | POA: Diagnosis not present

## 2021-01-07 DIAGNOSIS — G4733 Obstructive sleep apnea (adult) (pediatric): Secondary | ICD-10-CM | POA: Insufficient documentation

## 2021-01-07 DIAGNOSIS — I110 Hypertensive heart disease with heart failure: Secondary | ICD-10-CM | POA: Insufficient documentation

## 2021-01-07 DIAGNOSIS — Z8673 Personal history of transient ischemic attack (TIA), and cerebral infarction without residual deficits: Secondary | ICD-10-CM | POA: Insufficient documentation

## 2021-01-07 DIAGNOSIS — Z955 Presence of coronary angioplasty implant and graft: Secondary | ICD-10-CM | POA: Diagnosis not present

## 2021-01-07 DIAGNOSIS — Z6836 Body mass index (BMI) 36.0-36.9, adult: Secondary | ICD-10-CM | POA: Diagnosis not present

## 2021-01-07 DIAGNOSIS — Z7984 Long term (current) use of oral hypoglycemic drugs: Secondary | ICD-10-CM | POA: Diagnosis not present

## 2021-01-07 DIAGNOSIS — Z7902 Long term (current) use of antithrombotics/antiplatelets: Secondary | ICD-10-CM | POA: Diagnosis not present

## 2021-01-07 DIAGNOSIS — I2582 Chronic total occlusion of coronary artery: Secondary | ICD-10-CM | POA: Insufficient documentation

## 2021-01-07 LAB — BASIC METABOLIC PANEL
Anion gap: 7 (ref 5–15)
BUN: 21 mg/dL (ref 8–23)
CO2: 30 mmol/L (ref 22–32)
Calcium: 9.1 mg/dL (ref 8.9–10.3)
Chloride: 101 mmol/L (ref 98–111)
Creatinine, Ser: 1.37 mg/dL — ABNORMAL HIGH (ref 0.61–1.24)
GFR, Estimated: 54 mL/min — ABNORMAL LOW (ref >60)
Glucose, Bld: 98 mg/dL (ref 70–99)
Potassium: 4.6 mmol/L (ref 3.5–5.1)
Sodium: 138 mmol/L (ref 135–145)

## 2021-01-07 LAB — BRAIN NATRIURETIC PEPTIDE: B Natriuretic Peptide: 296 pg/mL — ABNORMAL HIGH (ref 0.0–100.0)

## 2021-01-07 NOTE — Chronic Care Management (AMB) (Signed)
  Chronic Care Management   Outreach Note  01/07/2021 Name: [name] MRN: [id number] DOB: August 05, 1946  [name] is a 75 y.o. year old male who is a primary care patient of Marin Olp, MD. I reached out to [name] by phone today in response to a referral sent by [name]'s PCP, Dr. Yong Channel     A second unsuccessful telephone outreach was attempted today. The patient was referred to the case management team for assistance with care management and care coordination.   Follow Up Plan: A HIPAA compliant phone message was left for the patient providing contact information and requesting a return call. The care management team will reach out to the patient again over the next 7 days. If patient returns call to provider office, please advise to call [place] at [phone].  [place] Management

## 2021-01-07 NOTE — Patient Instructions (Signed)
Labs done today, your results will be available in MyChart, we will contact you for abnormal readings.  Your physician recommends that you schedule a follow-up appointment in: 6 months Please call our office in August to schedule an appointment  If you have any questions or concerns before your next appointment please send [place] a message through [place] or call our office at [phone].    TO LEAVE A MESSAGE FOR THE NURSE SELECT OPTION 2, PLEASE LEAVE A MESSAGE INCLUDING: . YOUR NAME . DATE OF BIRTH . CALL BACK NUMBER . REASON FOR CALL**this is important as we prioritize the call backs  [place] AS LONG AS YOU CALL BEFORE 4:00 PM  At the [facility], you and your health needs are our priority. As part of our continuing mission to provide you with exceptional heart care, we have created designated Provider Care Teams. These Care Teams include your primary Cardiologist (physician) and Advanced Practice Providers (APPs- Physician Assistants and Nurse Practitioners) who all work together to provide you with the care you need, when you need it.   You may see any of the following providers on your designated Care Team at your next follow up: Marland Kitchen Dr Glori Bickers . Dr Loralie Champagne . Dr Vickki Muff . Darrick Grinder, NP . Lyda Jester, [place] . Audry Riles, PharmD   Please be sure to bring in all your medications bottles to every appointment.

## 2021-01-07 NOTE — Addendum Note (Signed)
Encounter addended by: Stanford Scotland, RN on: 01/07/2021 3:40 PM  Actions taken: Visit diagnoses modified, Order list changed, Diagnosis association updated, Clinical Note Signed, Charge Capture section accepted

## 2021-01-08 NOTE — Patient Instructions (Addendum)
Health Maintenance Due  Topic Date Due  . TETANUS/TDAP - can be done at pharmacy- will be cheaper there 09/22/2020  . OPHTHALMOLOGY EXAM Needs to reschedule was in the hospital.   11/10/2020   I think waiting about a month on covid  booster shot would be reasonable if you continue to improve  We will call you within two weeks about your referral to neurology. If you do not hear within 2 weeks, give [place] a call.   Please stop by lab before you go If you have mychart- we will send your results within 3 business days of [place] receiving them.  If you do not have mychart- we will call you about results within 5 business days of [place] receiving them.  *please also note that you will see labs on mychart as soon as they post. I will later go in and write notes on them- will say "notes from Dr. Yong Channel"  No changes today unless labs lead [place] to make changes  Recommended follow up: keep may visit or sooner if needed

## 2021-01-08 NOTE — Progress Notes (Signed)
Phone [phone]   Subjective:  [name] is a 75 y.o. year old very pleasant male patient who presents for transitional care management and hospital follow up for acute encephalopathy and acute on chronic respiratory failure with hypoxia related to systolic CHF. Patient was hospitalized from December 27, 2020 to December 31, 2020. A TCM phone call was completed on January 01, 2021. Medical complexity moderate  Patient with complicated hospital course.  Patient at home had become diaphoretic, short of breath, shaky with abrupt onset of altered mental status.  EMS was dispatched and home blood pressure was over 200-patient was given nitroglycerin but became unresponsive and was intubated in route to the hospital.  In the emergency room CT the head did not show any acute stroke, patient was in atrial fibrillation but went into ventricular tachycardia but was maintaining pulse and was given 150 mg of amiodarone and converted back to atrial fibrillation at which point he was started on a Cardizem drip.  Patient was noted to have elevated BNP.  Patient was admitted to the ICU.  Patient was treated for acute on chronic respiratory failure-this was thought related to pulmonary edema due to acute systolic CHF.  Patient ultimately able to be extubated on March 21.  Cardiology was consulted.  Volume status was optimized.  He was able to be transferred out of ICU on March 23 with stable volume status.   Noncompliance with medication A big factor to all of the issues here was that patient had been not taking his home medications.  He has been storing them in bottles and not taking what have been set out for him. He states was trying to avoid diuretics but per report was storing all of meds from weekly container.   Family has been concerned about potential dementia.  Since patient has been home he has been storing dirty depends in closet instead of throwing away even including stools - per his report was on antibiotics  for a dental infection and killed good bacteria and had a bad case of C. Diff- was being treated by Dr. Fuller Plan- now working with another GI doctor and doing better.  - witness verifies pills now- his wife and granddaughter calls to double check.  -he denies memory issues other than prior- potentially delirium related but we will monitor. Some issues with names but has always had issues.  -family has removed empty containers   Congestive heart failure with ejection fraction of 25% down from prior readings with hypertensive emergency with pulmonary edema/respiratory distress See notes above.  Cardiology was consulted as noted-discharge patient on Entresto, Jardiance, Lasix 40 mg twice daily, Aldactone 25 mg daily.  Potassium was decreased to 20 mEq twice daily and was recommended to have outpatient BMP.  Was advised close follow-up with CHF clinic.  -1.6 L during admission.  Weights down to 231 on discharge-weight today at 228-reinforced today checking home weights daily- states 224 this AM  -also cutting down on sodium since being home -no longer qualifies for defibrillator -off potassium for almost a week- cardiology wanted to see levels off of this with higher spironolactone dose  CAD status post drug-eluting stent in LAD 2004/history of STEMI in 2008 Atrial fibrillation Patient was continued on Plavix for CAD-his aspirin was stopped.  No anginal symptoms on imdur.  Compliant with atorvastatin 20 mg- LDL well controlled within a year  Patient did have a run of ventricular tachycardia with no recurrence after treatment in the emergency room Patient was placed  on Pradaxa for atrial fibrillation For rate control- coreg 25 mg BID  Acute respiratory failure with hypoxia-patient was noted to have some polycythemia with concern could be related to chronic component of hypoxia.  Patient was continued on CPAP for OSA.  Outpatient pulmonary follow-up was recommended to consider PFTs to evaluate for COPD-  has appointment with Dr. Halford Chessman- we discussed keeping this.  Of note COVID-19 test was negative  Thrombocytopenia-patient with thrombocytopenia in the hospital most recently noted at 118-outpatient follow-up recommended for repeat  Hypothyroidism-patient was continued on home Synthroid during visit- looked good in hospital   ulcerative colitis-patient was maintained on home medication  Diabetes-A1c well controlled at 5.9.  Continued on home medication- plus new jardiance. Metformin got adde dback this week. Also on januvia Lab Results  Component Value Date   VITAMINB12 1,256 (H) 09/02/2020   For CKD stage III-creatinine was holding stable during hospitalization around 1.4.  He was can 10 you on diuretics with outpatient follow-up recommended    Recommended potential consultations outpatient -cardiology and pulmonology. We also are addding neurology referral with memory concern.  Please note we believe this can reduce his risk of hospitalization if there is underlying dementia and he has more aggressive monitoring-thankful for family watching medicines both wife and daughter at this point and he is taking them consistently  I independently reviewed chest x-ray from 12/27/2020-only one view available.  Airway abnormal with endotracheal tube just inside of right mainstem bronchus, no bony abnormalities, diffuse bilateral interstitial edema, cardiac silhouette appears slightly large though this is an AP view, some blunting of costophrenic angle particular on the left  see problem oriented charting as well  Past Medical History-  Patient Active Problem List   Diagnosis Date Noted  . History of transient ischemic attack (TIA) 10/24/2015    Priority: High  . CHF (congestive heart failure), NYHA class II ([place]) 05/09/2014    Priority: High  . Ulcerative pancolitis without complication ([place]) 54/62/7035    Priority: High  . Atrial fibrillation ([place]) 12/08/2010    Priority: High  . Type II diabetes  mellitus with peripheral circulatory disorder ([place]) 04/16/2007    Priority: High  . CAD (coronary artery disease), native coronary artery 04/12/2007    Priority: High  . Morbid obesity ([place]) 08/09/2011    Priority: Medium  . Hyperlipidemia 04/16/2007    Priority: Medium  . Hypothyroidism 04/12/2007    Priority: Medium  . Essential hypertension 04/12/2007    Priority: Medium  . Obstructive sleep apnea 04/12/2007    Priority: Medium  . Former smoker 08/11/2016    Priority: Low  . MCI (mild cognitive impairment) 01/19/2016    Priority: Low  . Primary osteoarthritis of left knee 02/09/2015    Priority: Low  . Allergic rhinitis 06/25/2012    Priority: Low  . GERD (gastroesophageal reflux disease) 08/09/2011    Priority: Low  . VITAMIN B12 DEFICIENCY 11/29/2010    Priority: Low  . Lactose intolerance 01/06/2021  . Fecal incontinence 01/06/2021  . Acute on chronic respiratory failure with hypoxia ([place]) 12/27/2020  . Atrial fibrillation with RVR ([place])   . Acute encephalopathy   . Moderate nonproliferative diabetic retinopathy of both eyes ([place]) 01/16/2020  . Atherosclerotic heart disease of native coronary artery with other forms of angina pectoris ([place]) 04/26/2019  . C. difficile colitis 04/17/2019  . Dehydration 04/15/2019  . Diarrhea 04/15/2019  . ARF (acute renal failure) ([place]) 04/15/2019  . Chronic combined systolic and diastolic CHF (congestive heart failure) (  [place]) 04/15/2019  . Hypokalemia 04/15/2019  . AKI (acute kidney injury) ([place])   . Acute on chronic combined systolic and diastolic heart failure ([place]) 02/07/2018    Medications- reviewed and updated  A medical reconciliation was performed comparing current medicines to hospital discharge medications. Current Outpatient Medications  Medication Sig Dispense Refill  . APRISO 0.375 g 24 hr capsule Take 4 capsules (1.5 g total) by mouth daily. 120 capsule 5  . atorvastatin (LIPITOR) 20 MG tablet TAKE ONE TABLET (20MG  TOTAL) BY MOUTH DAILY 90 tablet 3  . carvedilol (COREG) 25 MG tablet TAKE ONE TABLET BY MOUTH TWICE A DAY 180 tablet 3  . clopidogrel (PLAVIX) 75 MG tablet TAKE ONE TABLET (75MG TOTAL) BY MOUTH DAILY 90 tablet 3  . Coenzyme Q10 (COQ10 PO) Take 1 capsule by mouth daily with supper.    . dicyclomine (BENTYL) 10 MG capsule TAKE ONE CAPSULE (10MG TOTAL) BY MOUTH TWO TIMES DAILY AS NEEDED FOR DIARRHEA OR ABDOMINAL CRAMPING 90 capsule 3  . empagliflozin (JARDIANCE) 10 MG TABS tablet TAKE ONE (1) TABLET BY MOUTH EVERY DAY 90 tablet 3  . fish oil-omega-3 fatty acids 1000 MG capsule Take 1 g by mouth daily with supper.    . furosemide (LASIX) 40 MG tablet Take 1 tablet (40 mg total) by mouth 2 (two) times daily. 60 tablet 0  . glucose blood test strip Use as instructed to test 4 time daily 400 each 3  . isosorbide mononitrate (IMDUR) 60 MG 24 hr tablet TAKE ONE (1) TABLET BY MOUTH EVERY DAY. Please make yearly appt with [name] for June 2022 for future refills. Thank you 1st attempt 90 tablet 0  . metFORMIN (GLUCOPHAGE) 500 MG tablet Take 1 tablet (500 mg total) by mouth 2 (two) times daily with a meal. 180 tablet 3  . PRADAXA 150 MG CAPS capsule TAKE ONE (1) CAPSULE BY MOUTH TWICE A DAY. (EVERY 12 HOURS.) 60 capsule 5  . saccharomyces boulardii (FLORASTOR) 250 MG capsule Take 1 capsule (250 mg total) by mouth 2 (two) times daily.    . sacubitril-valsartan (ENTRESTO) 97-103 MG Take 1 tablet by mouth 2 (two) times daily. 60 tablet 0  . sitaGLIPtin (JANUVIA) 100 MG tablet TAKE ONE (1) TABLET BY MOUTH EVERY DAY 90 tablet 3  . spironolactone (ALDACTONE) 25 MG tablet TAKE ONE (1) TABLET BY MOUTH EVERY DAY 90 tablet 3  . SYNTHROID 50 MCG tablet TAKE ONE TABLET BY MOUTH DAILY BEFORE BREAKFAST 90 tablet 3  . triamcinolone (KENALOG) 0.1 % Apply 1 application topically 2 (two) times daily. For 7-10 days maximum 80 g 0  . potassium chloride SA (KLOR-CON) 20 MEQ tablet Take 1 tablet (20 mEq total) by mouth 2 (two)  times daily for 7 days. Check your BMP in 5 to 7 days to see if you need to continue on potassium. 14 tablet 0   No current facility-administered medications for this visit.   Objective  Objective:  BP 104/60   Pulse (!) 57   Temp 98.1 F (36.7 C) (Temporal)   Ht 5' 7"  (1.702 m)   Wt 228 lb 3.2 oz (103.5 kg)   SpO2 97%   BMI 35.74 kg/m  Gen: NAD, resting comfortably CV: Bradycardic and irregular  Lungs: CTAB no crackles, wheeze, rhonchi Abdomen: soft/nontender/nondistended/normal bowel sounds. No rebound or guarding.  Ext: no edema Skin: warm, dry Neuro: Patient alert and oriented-no obvious confusion  Diabetic Foot Exam - Simple   Simple Foot Form Diabetic Foot  exam was performed with the following findings: Yes 01/13/2021 12:21 PM  Visual Inspection No deformities, no ulcerations, no other skin breakdown bilaterally: Yes Sensation Testing Intact to touch and monofilament testing bilaterally: Yes Pulse Check Posterior Tibialis and Dorsalis pulse intact bilaterally: Yes Comments Onychomycosis noted       Assessment and Plan:   Noncompliance/concern for dementia with prior mild cognitive impairment I believe a major potential cause for recent hospitalization with noncompliance.  Family wonders about underlying dementia.  I also wonder if delirium could have played a role-was dealing with C. difficile and fluid overload at the same time it sounds like.  Family would like a referral to neurology as they had seen in 2017 and noted mild cognitive impairment at that time-patient seemed to do better initially and then symptoms worsen.  Referral was placed today.  I also go to see him back in May and if he has not seen neurology at that time I will complete an MMSE and consider starting Aricept -B12 normal within 6 months, TSH normal within a month, CT scan of the brain without obvious mass or stroke  Congestive heart failure with ejection fraction of 25% also with hypertensive  emergency and respiratory distress Thankfully patient did very well with diuresis.  At present he is staying on Lasix 40 mg twice daily, Aldactone 25 mg, Entresto, Jardiance-BMP about a week ago but he was on potassium at that time.  Has been off potassium since that time we agreed to check potassium levels today with CMP and see if this needs to be restarted or not-he may not need it with Aldactone and CKD stage III.  We will also try to make sure CKD stage III is stable -Unfortunately sounds like he no longer qualifies for defibrillator per granddaughter who is an Therapist, sports and went to cardiology visit -Respiratory failure obviously resolved -There is some question about underlying pulmonary disease with polycythemia-has upcoming pulmonology visit for pulmonary function tests  CAD Asymptomatic-continue Plavix.  No longer on aspirin.  No anginal symptoms on Imdur-continue current medication  Atrial fibrillation Anticoagulated with Pradaxa Rate controlled with carvedilol 25 mg twice daily Appropriately treated-continue current medication  Mild thrombocytopenia Noted during hospitalization-we will update CBC with labs  Hypothyroidism TSH well controlled in hospital-continue current medications  Ulcerative colitis Patient is seeing a new provider in [place]-no colonoscopy planned previously per cardiology with overall health unless needed specifically for ulcerative colitis-not from colon cancer screening specifically.  Continue Apriso.  Also continue Bentyl for abdominal cramping if needed  Diabetes A1c well controlled the hospital-Jardiance primarily for heart failure but should also with diabetes, also back on Metformin and Januvia   Rash in groin-for a few days-improving with [employer].  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 Chronic systolic congestive heart failure, NYHA class 2 (HCC)  I50.22 CBC with Differential/Platelet    Comprehensive metabolic panel  2. Mild cognitive impairment  G31.84 Ambulatory referral to Neurology  3. Type II diabetes mellitus with peripheral circulatory disorder (HCC)  E11.51   4. Atherosclerotic heart disease of native coronary artery with other forms of angina pectoris (HCC) Chronic I25.118   5. Ulcerative pancolitis without complication ([place])  G64.40   6. Permanent atrial fibrillation (HCC)  I48.21   7. Hypothyroidism, unspecified type  E03.9   8. Hyperlipidemia, unspecified hyperlipidemia type  E78.5   9. Essential hypertension  I10   10. Memory loss  R41.3    Meds ordered this encounter  Medications  . triamcinolone (KENALOG) 0.1 %    Sig: Apply 1 application topically 2 (two) times daily. For 7-10 days maximum    Dispense:  80 g    Refill:  0   Time Spent: 68 minutes of total time (11:35 AM- 12:43 PM) was spent on the date of the encounter performing the following actions: chart review prior to seeing the patient, obtaining history, performing a medically necessary exam, counseling on the treatment plan, placing orders, and documenting in our EHR.   Return precautions advised.  Garret Reddish, MD

## 2021-01-11 NOTE — Chronic Care Management (AMB) (Signed)
  Chronic Care Management   Outreach Note  01/11/2021 Name: KHYLON DAVIES MRN: [id number] DOB: 1946-09-23  ARTEM BUNTE is a 76 y.o. year old male who is a primary care patient of Marin Olp, MD. I reached out to [name] by phone today in response to a referral sent by Mr. Osmin Welz Mia's PCP, Dr. Yong Channel.     An unsuccessful telephone outreach was attempted today. The patient was referred to the case management team for assistance with care management and care coordination.   Follow Up Plan: A HIPAA compliant phone message was left for the patient providing contact information and requesting a return call. The care management team will reach out to the patient again over the next 7 days. If patient returns call to provider office, please advise to call [facility] at [phone].  [facility] [place] Management

## 2021-01-13 ENCOUNTER — Other Ambulatory Visit: Payer: Self-pay

## 2021-01-13 ENCOUNTER — Encounter: Payer: Self-pay | Admitting: Family Medicine

## 2021-01-13 ENCOUNTER — Telehealth: Payer: Self-pay

## 2021-01-13 ENCOUNTER — Ambulatory Visit (INDEPENDENT_AMBULATORY_CARE_PROVIDER_SITE_OTHER): Payer: Medicare Other | Admitting: Family Medicine

## 2021-01-13 VITALS — BP 104/60 | HR 57 | Temp 98.1°F | Ht 67.0 in | Wt 228.2 lb

## 2021-01-13 DIAGNOSIS — I5022 Chronic systolic (congestive) heart failure: Secondary | ICD-10-CM

## 2021-01-13 DIAGNOSIS — E1151 Type 2 diabetes mellitus with diabetic peripheral angiopathy without gangrene: Secondary | ICD-10-CM

## 2021-01-13 DIAGNOSIS — I25118 Atherosclerotic heart disease of native coronary artery with other forms of angina pectoris: Secondary | ICD-10-CM

## 2021-01-13 DIAGNOSIS — K5100 Ulcerative (chronic) pancolitis without complications: Secondary | ICD-10-CM | POA: Diagnosis not present

## 2021-01-13 DIAGNOSIS — E785 Hyperlipidemia, unspecified: Secondary | ICD-10-CM

## 2021-01-13 DIAGNOSIS — G3184 Mild cognitive impairment, so stated: Secondary | ICD-10-CM

## 2021-01-13 DIAGNOSIS — I4821 Permanent atrial fibrillation: Secondary | ICD-10-CM | POA: Diagnosis not present

## 2021-01-13 DIAGNOSIS — E039 Hypothyroidism, unspecified: Secondary | ICD-10-CM | POA: Diagnosis not present

## 2021-01-13 DIAGNOSIS — I10 Essential (primary) hypertension: Secondary | ICD-10-CM | POA: Diagnosis not present

## 2021-01-13 DIAGNOSIS — R413 Other amnesia: Secondary | ICD-10-CM

## 2021-01-13 LAB — COMPREHENSIVE METABOLIC PANEL
ALT: 14 U/L (ref 0–53)
AST: 18 U/L (ref 0–37)
Albumin: 4.1 g/dL (ref 3.5–5.2)
Alkaline Phosphatase: 86 U/L (ref 39–117)
BUN: 25 mg/dL — ABNORMAL HIGH (ref 6–23)
CO2: 31 mEq/L (ref 19–32)
Calcium: 9.4 mg/dL (ref 8.4–10.5)
Chloride: 100 mEq/L (ref 96–112)
Creatinine, Ser: 1.55 mg/dL — ABNORMAL HIGH (ref 0.40–1.50)
GFR: 43.68 mL/min — ABNORMAL LOW (ref >60.00)
Glucose, Bld: 123 mg/dL — ABNORMAL HIGH (ref 70–99)
Potassium: 4.5 mEq/L (ref 3.5–5.1)
Sodium: 138 mEq/L (ref 135–145)
Total Bilirubin: 1.1 mg/dL (ref 0.2–1.2)
Total Protein: 6.8 g/dL (ref 6.0–8.3)

## 2021-01-13 LAB — CBC WITH DIFFERENTIAL/PLATELET
Basophils Absolute: 0.1 10*3/uL (ref 0.0–0.1)
Basophils Relative: 0.6 % (ref 0.0–3.0)
Eosinophils Absolute: 0.2 10*3/uL (ref 0.0–0.7)
Eosinophils Relative: 1.6 % (ref 0.0–5.0)
HCT: 49.5 % (ref 39.0–52.0)
Hemoglobin: 16.9 g/dL (ref 13.0–17.0)
Lymphocytes Relative: 9.7 % — ABNORMAL LOW (ref 12.0–46.0)
Lymphs Abs: 0.9 10*3/uL (ref 0.7–4.0)
MCHC: 34.2 g/dL (ref 30.0–36.0)
MCV: 88.8 fl (ref 78.0–100.0)
Monocytes Absolute: 0.7 10*3/uL (ref 0.1–1.0)
Monocytes Relative: 7.4 % (ref 3.0–12.0)
Neutro Abs: 7.8 10*3/uL — ABNORMAL HIGH (ref 1.4–7.7)
Neutrophils Relative %: 80.7 % — ABNORMAL HIGH (ref 43.0–77.0)
Platelets: 167 10*3/uL (ref 150.0–400.0)
RBC: 5.57 Mil/uL (ref 4.22–5.81)
RDW: 13.7 % (ref 11.5–15.5)
WBC: 9.6 10*3/uL (ref 4.0–10.5)

## 2021-01-13 MED ORDER — TRIAMCINOLONE ACETONIDE 0.1 % EX CREA: 1.0000 "application " | TOPICAL_CREAM | Freq: Two times a day (BID) | CUTANEOUS | 0 refills | Status: DC

## 2021-01-13 NOTE — Telephone Encounter (Signed)
pharmacy called needing supporting documents to fill patients Rx for test strips   Please call and advise   glucose blood test strip  [place], [address]

## 2021-01-14 ENCOUNTER — Encounter: Payer: Self-pay | Admitting: Internal Medicine

## 2021-01-14 MED ORDER — GLUCOSE BLOOD VI STRP: ORAL_STRIP | 3 refills | Status: AC

## 2021-01-14 NOTE — Telephone Encounter (Signed)
Rx sent to preferred pharmacy.

## 2021-01-15 NOTE — Chronic Care Management (AMB) (Signed)
  Chronic Care Management   Note  01/15/2021 Name: [name] MRN: [id number] DOB: 24-Jan-1946  [name] is a 75 y.o. year old male who is a primary care patient of Marin Olp, MD. I reached out to [name] by phone today in response to a referral sent by [name]'s PCP, Dr. Yong Channel.     [name] was given information about Chronic Care Management services today including:  1. CCM service includes personalized support from designated clinical staff supervised by his physician, including individualized plan of care and coordination with other care providers 2. 24/7 contact phone numbers for assistance for urgent and routine care needs. 3. Service will only be billed when office clinical staff spend 20 minutes or more in a month to coordinate care. 4. Only one practitioner may furnish and bill the service in a calendar month. 5. The patient may stop CCM services at any time (effective at the end of the month) by phone call to the office staff. 6. The patient will be responsible for cost sharing (co-pay) of up to 20% of the service fee (after annual deductible is met).  Patient agreed to services and verbal consent obtained.   Follow up plan: Telephone appointment with care management team member scheduled for:02/02/2021  [facility]  Direct Dial: [phone]

## 2021-01-16 ENCOUNTER — Other Ambulatory Visit: Payer: Self-pay | Admitting: Internal Medicine

## 2021-01-27 ENCOUNTER — Other Ambulatory Visit: Payer: Self-pay

## 2021-01-27 ENCOUNTER — Encounter: Payer: Self-pay | Admitting: Pulmonary Disease

## 2021-01-27 ENCOUNTER — Ambulatory Visit (INDEPENDENT_AMBULATORY_CARE_PROVIDER_SITE_OTHER): Payer: Medicare Other | Admitting: Pulmonary Disease

## 2021-01-27 VITALS — BP 128/62 | HR 82 | Temp 97.5°F | Ht 68.0 in | Wt 228.6 lb

## 2021-01-27 DIAGNOSIS — G4733 Obstructive sleep apnea (adult) (pediatric): Secondary | ICD-10-CM

## 2021-01-27 DIAGNOSIS — Z9989 Dependence on other enabling machines and devices: Secondary | ICD-10-CM

## 2021-01-27 DIAGNOSIS — I2510 Atherosclerotic heart disease of native coronary artery without angina pectoris: Secondary | ICD-10-CM | POA: Diagnosis not present

## 2021-01-27 NOTE — Patient Instructions (Signed)
Will have [facility] arrange for new CPAP machine and new supplies  Follow up in 4 months

## 2021-01-27 NOTE — Progress Notes (Signed)
[facility]  Chief Complaint  Patient presents with  . Follow-up    PHOS-feeling good    Constitutional:  BP 128/62 (BP Location: Right Arm, Cuff Size: Normal)   Pulse 82   Temp (!) 97.5 F (36.4 C) (Temporal)   Ht 5' 8"  (1.727 m)   Wt 228 lb 9.6 oz (103.7 kg)   SpO2 99%   BMI 34.76 kg/m   Past Medical History:  A fib, B12 deficiency, C diff, CAD, Combined CHF, Macular edema, DM type 2, Diverticulosis, GERD, HLD, HTN, Hypothyroidism, CVA, Ulcerative colitis, Ventricular tachycardia  Past Surgical History:  He  has a past surgical history that includes Coronary stent placement (2008); Cardiac catheterization (10/2002); Tonsillectomy; Cardiac catheterization (N/A, 10/28/2015); RIGHT/LEFT HEART CATH AND CORONARY ANGIOGRAPHY (N/A, 06/01/2018); and Cataract extraction.  Brief Summary:  [name] is a 75 y.o. male former smoker with obstructive sleep apnea.      Subjective:   He is here with his daughter.  He was in hospital in March 2022 with CHF exacerbation with hypertensive emergency.  PCCM consulted to assist with hypoxic respiratory failure from acute pulmonary edema and sleep apnea.  He did require intubation while in hospital.  He had original sleep study in 2009.  Found to have severe sleep apnea.  Had CPAP initial set up through [facility], and then switch to [facility].  Hasn't had any contact with DME for years.  He had a [employer] device, but hasn't been using recently.  He reports using CPAP nightly.  Feels pressure is okay.  Has full face mask.  Feels like he is sleeping okay.  His CPAP machine is at least 76 yrs old, and starting to make some funny noises.  Physical Exam:   Appearance - well kempt   ENMT - no sinus tenderness, no oral exudate, no LAN, Mallampati 3 airway, no stridor, wears dentures  Respiratory - equal breath sounds bilaterally, no wheezing or rales  CV - s1s2 regular rate and rhythm, no murmurs  Ext  - no clubbing, no edema  Skin - no rashes  Psych - normal mood and affect   Sleep Tests:   PSG 5//07/09 >> AHI 96, SpO2 low 74%, CPAP 13 cm H2O  Cardiac Tests:   Echo 12/28/20 >> EF 50 to 55%, mild LVH, mild LA dilation  Social History:  He  reports that he quit smoking about 54 years ago. His smoking use included cigarettes. He has a 5.00 pack-year smoking history. He has never used smokeless tobacco. He reports that he does not drink alcohol and does not use drugs.  Family History:  His family history includes Heart attack in his mother; Heart disease in his father; Hypertension in his brother; Obesity in his brother; Stomach cancer in his maternal aunt and paternal grandmother.     Assessment/Plan:   Obstructive sleep apnea. - he is compliant with therapy and reports benefit from CPAP - he would like to change DMEs to [facility] - his current device is more than 75 yrs old, not functioning properly and not amenable to repair - will arrange for new auto CPAP with pressure of 8 to 15 cm H2O - explained he might need to repeat home sleep study for insurance to cover new CPAP set up - discussed concerns around ozone based cleaning systems, and discussed alternative cleaning techniques  CAD, chronic combined CHF, HTN, Chronic A fib, VT. - followed by Dr. Sherren Mocha and Dr. Pierre Bali  with [facility]  Ulcerative colitis. - followed by Dr. Gala Romney with [facility]  Time Spent Involved in Patient Care on Day of Examination:  33 minutes  Follow up:  Patient Instructions  Will have [facility] arrange for new CPAP machine and new supplies  Follow up in 4 months   Medication List:   Allergies as of 01/27/2021      Reactions   Clindamycin/lincomycin    Developed C. difficile colitis 1 month after use      Medication List       Accurate as of January 27, 2021 11:35 AM. If you have any questions, ask your nurse or doctor.         Apriso 0.375 g 24 hr capsule Generic drug: mesalamine Take 4 capsules (1.5 g total) by mouth daily.   atorvastatin 20 MG tablet Commonly known as: LIPITOR TAKE ONE TABLET (20MG TOTAL) BY MOUTH DAILY   carvedilol 25 MG tablet Commonly known as: COREG TAKE ONE TABLET BY MOUTH TWICE A DAY   clopidogrel 75 MG tablet Commonly known as: PLAVIX TAKE ONE TABLET (75MG TOTAL) BY MOUTH DAILY   COQ10 PO Take 1 capsule by mouth daily with supper.   dicyclomine 10 MG capsule Commonly known as: BENTYL TAKE ONE CAPSULE (10MG TOTAL) BY MOUTH TWO TIMES DAILY AS NEEDED FOR DIARRHEA OR ABDOMINAL CRAMPING   empagliflozin 10 MG Tabs tablet Commonly known as: Jardiance TAKE ONE (1) TABLET BY MOUTH EVERY DAY   Entresto 97-103 MG Generic drug: sacubitril-valsartan Take 1 tablet by mouth 2 (two) times daily.   fish oil-omega-3 fatty acids 1000 MG capsule Take 1 g by mouth daily with supper.   furosemide 40 MG tablet Commonly known as: LASIX Take 1 tablet (40 mg total) by mouth 2 (two) times daily.   glucose blood test strip Use as instructed to test 4 time daily   isosorbide mononitrate 60 MG 24 hr tablet Commonly known as: IMDUR TAKE ONE (1) TABLET BY MOUTH EVERY DAY. Please make yearly appt with [name] for June 2022 for future refills. Thank you 1st attempt   metFORMIN 500 MG tablet Commonly known as: GLUCOPHAGE Take 1 tablet (500 mg total) by mouth 2 (two) times daily with a meal.   potassium chloride SA 20 MEQ tablet Commonly known as: KLOR-CON Take 1 tablet (20 mEq total) by mouth 2 (two) times daily for 7 days. Check your BMP in 5 to 7 days to see if you need to continue on potassium.   Pradaxa 150 MG Caps capsule Generic drug: dabigatran TAKE ONE (1) CAPSULE BY MOUTH TWICE A DAY. (EVERY 12 HOURS.)   saccharomyces boulardii 250 MG capsule Commonly known as: Florastor Take 1 capsule (250 mg total) by mouth 2 (two) times daily.   sitaGLIPtin 100 MG tablet Commonly known  as: Januvia TAKE ONE (1) TABLET BY MOUTH EVERY DAY   spironolactone 25 MG tablet Commonly known as: ALDACTONE TAKE ONE (1) TABLET BY MOUTH EVERY DAY   Synthroid 50 MCG tablet Generic drug: levothyroxine TAKE ONE TABLET BY MOUTH DAILY BEFORE BREAKFAST   triamcinolone cream 0.1 % Commonly known as: KENALOG Apply 1 application topically 2 (two) times daily. For 7-10 days maximum       Signature:  Chesley Mires, MD [place] Pager - [phone] 01/27/2021, 11:35 AM

## 2021-02-01 ENCOUNTER — Ambulatory Visit: Payer: Self-pay | Admitting: Pulmonary Disease

## 2021-02-02 ENCOUNTER — Ambulatory Visit (INDEPENDENT_AMBULATORY_CARE_PROVIDER_SITE_OTHER): Payer: Medicare Other | Admitting: *Deleted

## 2021-02-02 DIAGNOSIS — I10 Essential (primary) hypertension: Secondary | ICD-10-CM | POA: Diagnosis not present

## 2021-02-02 DIAGNOSIS — E1151 Type 2 diabetes mellitus with diabetic peripheral angiopathy without gangrene: Secondary | ICD-10-CM | POA: Diagnosis not present

## 2021-02-02 DIAGNOSIS — I5022 Chronic systolic (congestive) heart failure: Secondary | ICD-10-CM

## 2021-02-02 NOTE — Patient Instructions (Signed)
Visit Information   PATIENT GOALS:  Goals Addressed            This Visit's Progress   . (RNCM) Monitor and Manage My Blood Sugar-Diabetes Type 2       Timeframe:  Long-Range Goal Priority:  Medium Start Date:   02/02/21                          Expected End Date: 08/09/21                      Follow Up Date 03/02/21    . Check blood sugar at least once or twice daily . Check blood sugar if I feel it is too high or too low . Enter blood sugar readings and medication into daily log . Take the blood sugar log to all doctor visits  . Reschedule eye examination   Why is this important?    Checking your blood sugar at home helps to keep it from getting very high or very low.   Writing the results in a diary or log helps the doctor know how to care for you.   Your blood sugar log should have the time, date and the results.   Also, write down the amount of insulin or other medicine that you take.   Other information, like what you ate, exercise done and how you were feeling, will also be helpful.     Notes:     Marland Kitchen (RNCM) Track and Manage Fluids and Swelling-Heart Failure       Timeframe:  Long-Range Goal Priority:  High Start Date:  02/02/21                           Expected End Date:   08/09/21                    Follow Up Date 03/02/21    . Call office if I gain more than 2 pounds in one day or 5 pounds in one week . Keep legs up while sitting . Use salt in moderation . Watch for swelling in feet, ankles and legs every day . Weigh myself daily and write in log for provider review . Take blood pressure daily and write in log for provider review . Notify provider for sustained blood pressure elevations or hypotension   Why is this important?    It is important to check your weight daily and watch how much salt and liquids you have.   It will help you to manage your heart failure.    Notes:      Consent to CCM Services: Mr. Indelicato was given information about  Chronic Care Management services today including:  1. CCM service includes personalized support from designated clinical staff supervised by his physician, including individualized plan of care and coordination with other care providers 2. 24/7 contact phone numbers for assistance for urgent and routine care needs. 3. Service will only be billed when office clinical staff spend 20 minutes or more in a month to coordinate care. 4. Only one practitioner may furnish and bill the service in a calendar month. 5. The patient may stop CCM services at any time (effective at the end of the month) by phone call to the office staff. 6. The patient will be responsible for cost sharing (co-pay) of up to 20% of the service fee (after annual  deductible is met).  Patient agreed to services and verbal consent obtained.   Patient verbalizes understanding of instructions provided today and agrees to view in [facility].   The care management team will reach out to the patient again over the next 30 business days.   Hubert Azure RN, MSN RN Care Management Coordinator  [place] [phone] [email or web address]   CLINICAL CARE PLAN: Patient Care Plan: Heart Failure (Adult)  Problem Identified: Symptom Exacerbation (Heart Failure)   Priority: High  Long-Range Goal: Patient will report no hospital readmissions related to heart failure within the next 90 days   Start Date: 02/02/2021  Expected End Date: 08/09/2021  Priority: High  Objective:  . Last practice recorded BP readings:  BP Readings from Last 3 Encounters:  01/27/21 128/62  01/13/21 104/60  01/07/21 122/78  Current Barriers:  Marland Kitchen Knowledge deficit related to basic heart failure and hypertension pathophysiology and self care management as evidenced by recent hospitalization related to hypertension and heart failure exacerbation in March.  Patient admits that his hospitalization was related to him stop taking his  fluid medication and states he understands he should not do this again.  States he monitors weights daily.  Weight this morning was 225.3 pounds.  Checks blood pressures daily, 122/78 (states this is what it has been ranging since discharge).  History of atrial fibrillation and states he is unaware of when his heart rate is elevated.  Reports compliance with medications and states he has been trying to decrease the sodium in his diet.  Reports nightly use of CPAP (awaiting new CPAP machine from [place]). Case Manager Clinical Goal(s):  Marland Kitchen Patient will verbalize understanding of plan for hypertension and heart failure management . Patient will report weighing self daily . Patient will not experience hospital admission. Hospital Admissions in last 6 months = 1 Interventions:  . Collaboration with Marin Olp, MD regarding development and update of comprehensive plan of care as evidenced by provider attestation and co-signature . Inter-disciplinary care team collaboration (see longitudinal plan of care) . Evaluation of current treatment plan related to hypertension self management and patient's adherence to plan as established by provider. . Provided education to patient re: stroke prevention, s/s of heart attack and stroke, DASH diet, complications of uncontrolled blood pressure . Reviewed medications with patient and discussed importance of compliance . Discussed plans with patient for ongoing care management follow up and provided patient with direct contact information for care management team . Advised patient, providing education and rationale, to monitor blood pressure daily and record, calling PCP for findings outside established parameters.  . Basic overview and discussion of pathophysiology of Heart Failure reviewed  . Provided verbal education on low sodium diet and encouraged to follow a low sodium diet/DASH diet . Reviewed Heart Failure Action Plan in depth and provided  written copy . Assessed need for readable accurate scales in home . Advised patient to weigh each morning after emptying bladder . Discussed importance of daily weight and advised patient to weigh and record daily . Reviewed role of diuretics in prevention of fluid overload and management of heart failure . Barriers to lifestyle changes reviewed and addressed, barriers to treatment reviewed and addressed . Cognitive screening completed and reviewed, depression screen reviewed . Healthy lifestyle promoted . Medication-adherence assessment completed; discussed medication cost and patient denies any trouble affording medications at this time . Heart failure rescue (action) plan reviewed . Self-awareness of signs/symptoms of worsening disease encouraged . Encouraged to attend  all scheduled provider appointments . Sending Heart Failure and Atrial Fibrillation EMMI video . Patient Goals/Self-Care Activities: . Call office if I gain more than 2 pounds in one day or 5 pounds in one week . Keep legs up while sitting . Use salt in moderation . Watch for swelling in feet, ankles and legs every day . Weigh myself daily and write in log for provider review . Take blood pressure daily and write in log for provider review . Notify provider for sustained blood pressure elevations or hypotension . Follow Up Plan: The care management team will reach out to the patient again over the next 30 business days.     Patient Care Plan: Diabetes Type 2 (Adult)  Problem Identified: Glycemic Management (Diabetes, Type 2)   Priority: Medium  Long-Range Goal: Patient will report maintaining Hgb A1C of below 6.5 within the next 180 days   Start Date: 02/02/2021  Expected End Date: 08/09/2021  Priority: Medium  Note:   Objective:  Lab Results  Component Value Date   HGBA1C 5.9 (H) 12/27/2020 .   Lab Results  Component Value Date   CREATININE 1.55 (H) 01/13/2021   CREATININE 1.37 (H) 01/07/2021   CREATININE  1.40 (H) 12/31/2020  Current Barriers:  Marland Kitchen Knowledge Deficits related to basic Diabetes pathophysiology and self care/management; Monitors blood sugars about twice a day.  Fasting blood sugar this morning was 113 with recent fasting ranges of 110-120's.  Denies any recent episodes of hypo or hyperglycemia. Case Manager Clinical Goal(s):  . patient will demonstrate improved adherence to prescribed treatment plan for diabetes self care/management as evidenced by: daily monitoring and recording of CBG,  adherence to ADA/ carb modified diet, adherence to prescribed medication regimen, contacting provider for new or worsened symptoms or questions Interventions:  . Collaboration with Marin Olp, MD regarding development and update of comprehensive plan of care as evidenced by provider attestation and co-signature . Inter-disciplinary care team collaboration (see longitudinal plan of care) . Provided education to patient about basic DM disease process . Reviewed medications with patient and discussed importance of medication adherence . Discussed plans with patient for ongoing care management follow up and provided patient with direct contact information for care management team . Advised patient, providing education and rationale, to check cbg at least daily and record, calling Endocrinologist for findings outside established parameters.   . Barriers to adherence to treatment plan identified . Blood glucose monitoring encouraged, blood glucose readings reviewed, and use of blood glucose monitoring log promoted . Individualized medical nutrition therapy provided, discussed and encouraged carbohydrate modified diabetic low sodium diet, discussed health food and drink options . Mutual A1C goal set or reviewed; congratulated on current Hgb A1C and discussed ways to maintain . Self-awareness of signs/symptoms of hypo or hyperglycemia encouraged . Encouraged to attends all scheduled provider  appointments . Encouraged to schedule eye exam Patient Goals/Self-Care Activities: . Check blood sugar at least once or twice daily . Check blood sugar if I feel it is too high or too low . Enter blood sugar readings and medication into daily log . Take the blood sugar log to all doctor visits  . Reschedule eye examination Follow Up Plan: The care management team will reach out to the patient again over the next 30 business days.

## 2021-02-02 NOTE — Chronic Care Management (AMB) (Signed)
Chronic Care Management   CCM RN Visit Note  02/02/2021 Name: [name] MRN: [id number] DOB: 02-Sep-1946  Subjective: [name] is a 75 y.o. year old male who is a primary care patient of Yong Channel, Brayton Mars, MD. The care management team was consulted for assistance with disease management and care coordination needs.    Engaged with patient by telephone for initial visit in response to provider referral for case management and/or care coordination services.   Consent to Services:  The patient was given the following information about Chronic Care Management services today, agreed to services, and gave verbal consent: 1. CCM service includes personalized support from designated clinical staff supervised by the primary care provider, including individualized plan of care and coordination with other care providers 2. 24/7 contact phone numbers for assistance for urgent and routine care needs. 3. Service will only be billed when office clinical staff spend 20 minutes or more in a month to coordinate care. 4. Only one practitioner may furnish and bill the service in a calendar month. 5.The patient may stop CCM services at any time (effective at the end of the month) by phone call to the office staff. 6. The patient will be responsible for cost sharing (co-pay) of up to 20% of the service fee (after annual deductible is met). Patient agreed to services and consent obtained.  Patient agreed to services and verbal consent obtained.   Assessment: Review of patient past medical history, allergies, medications, health status, including review of consultants reports, laboratory and other test data, was performed as part of comprehensive evaluation and provision of chronic care management services.   SDOH (Social Determinants of Health) assessments and interventions performed:  SDOH Interventions   Flowsheet Row Most Recent Value  SDOH Interventions   Food Insecurity Interventions Intervention Not  Indicated  Financial Strain Interventions Intervention Not Indicated  Housing Interventions Intervention Not Indicated  Intimate Partner Violence Interventions Intervention Not Indicated       CCM Care Plan  Allergies  Allergen Reactions  . Clindamycin/Lincomycin     Developed C. difficile colitis 1 month after use    Outpatient Encounter Medications as of 02/02/2021  Medication Sig Note  . APRISO 0.375 g 24 hr capsule Take 4 capsules (1.5 g total) by mouth daily.   Marland Kitchen atorvastatin (LIPITOR) 20 MG tablet TAKE ONE TABLET (20MG TOTAL) BY MOUTH DAILY   . carvedilol (COREG) 25 MG tablet TAKE ONE TABLET BY MOUTH TWICE A DAY   . clopidogrel (PLAVIX) 75 MG tablet TAKE ONE TABLET (75MG TOTAL) BY MOUTH DAILY   . Coenzyme Q10 (COQ10 PO) Take 1 capsule by mouth daily with supper.   . dicyclomine (BENTYL) 10 MG capsule TAKE ONE CAPSULE (10MG TOTAL) BY MOUTH TWO TIMES DAILY AS NEEDED FOR DIARRHEA OR ABDOMINAL CRAMPING   . empagliflozin (JARDIANCE) 10 MG TABS tablet TAKE ONE (1) TABLET BY MOUTH EVERY DAY   . fish oil-omega-3 fatty acids 1000 MG capsule Take 1 g by mouth daily with supper.   . isosorbide mononitrate (IMDUR) 60 MG 24 hr tablet TAKE ONE (1) TABLET BY MOUTH EVERY DAY. Please make yearly appt with [name] for June 2022 for future refills. Thank you 1st attempt   . metFORMIN (GLUCOPHAGE) 500 MG tablet Take 1 tablet (500 mg total) by mouth 2 (two) times daily with a meal.   . PRADAXA 150 MG CAPS capsule TAKE ONE (1) CAPSULE BY MOUTH TWICE A DAY. (EVERY 12 HOURS.)   . saccharomyces boulardii (  FLORASTOR) 250 MG capsule Take 1 capsule (250 mg total) by mouth 2 (two) times daily.   . sitaGLIPtin (JANUVIA) 100 MG tablet TAKE ONE (1) TABLET BY MOUTH EVERY DAY   . spironolactone (ALDACTONE) 25 MG tablet TAKE ONE (1) TABLET BY MOUTH EVERY DAY   . SYNTHROID 50 MCG tablet TAKE ONE TABLET BY MOUTH DAILY BEFORE BREAKFAST   . triamcinolone (KENALOG) 0.1 % Apply 1 application topically 2 (two) times  daily. For 7-10 days maximum   . furosemide (LASIX) 40 MG tablet Take 1 tablet (40 mg total) by mouth 2 (two) times daily. 02/02/2021: Continues to take  . glucose blood test strip Use as instructed to test 4 time daily   . potassium chloride SA (KLOR-CON) 20 MEQ tablet Take 1 tablet (20 mEq total) by mouth 2 (two) times daily for 7 days. Check your BMP in 5 to 7 days to see if you need to continue on potassium. 02/02/2021: Reports no longer taking   No facility-administered encounter medications on file as of 02/02/2021.    Patient Active Problem List   Diagnosis Date Noted  . Lactose intolerance 01/06/2021  . Fecal incontinence 01/06/2021  . Acute on chronic respiratory failure with hypoxia (HCC) 12/27/2020  . Atrial fibrillation with RVR (HCC)   . Acute encephalopathy   . Moderate nonproliferative diabetic retinopathy of both eyes (HCC) 01/16/2020  . Atherosclerotic heart disease of native coronary artery with other forms of angina pectoris (HCC) 04/26/2019  . C. difficile colitis 04/17/2019  . Dehydration 04/15/2019  . Diarrhea 04/15/2019  . ARF (acute renal failure) (HCC) 04/15/2019  . Chronic combined systolic and diastolic CHF (congestive heart failure) (HCC) 04/15/2019  . Hypokalemia 04/15/2019  . AKI (acute kidney injury) (HCC)   . Acute on chronic combined systolic and diastolic heart failure (HCC) 02/07/2018  . Former smoker 08/11/2016  . MCI (mild cognitive impairment) 01/19/2016  . History of transient ischemic attack (TIA) 10/24/2015  . Primary osteoarthritis of left knee 02/09/2015  . CHF (congestive heart failure), NYHA class II (HCC) 05/09/2014  . Allergic rhinitis 06/25/2012  . 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 GERD (gastroesophageal reflux disease) 08/09/2011  . Morbid obesity (HCC) 08/09/2011  . Atrial fibrillation (HCC) 12/08/2010  . VITAMIN B12 DEFICIENCY 11/29/2010  . Type II diabetes mellitus with peripheral circulatory disorder  (HCC) 04/16/2007  . Hyperlipidemia 04/16/2007  . Hypothyroidism 04/12/2007  . Essential hypertension 04/12/2007  . CAD (coronary artery disease), native coronary artery 04/12/2007  . Obstructive sleep apnea 04/12/2007    Conditions to be addressed/monitored:CHF, HTN and DMII  Care Plan : Heart Failure (Adult)  Updates made by ,  D, RN since 02/02/2021 12:00 AM  Problem: Symptom Exacerbation (Heart Failure)   Priority: High  Long-Range Goal: Patient will report no hospital readmissions related to heart failure within the next 90 days   Start Date: 02/02/2021  Expected End Date: 08/09/2021  Priority: High  Objective:  . Last practice recorded BP readings:  BP Readings from Last 3 Encounters:  01/27/21 128/62  01/13/21 104/60  01/07/21 122/78  Current Barriers:  . Knowledge deficit related to basic heart failure and hypertension pathophysiology and self care management as evidenced by recent hospitalization related to hypertension and heart failure exacerbation in March.  Patient admits that his hospitalization was related to him stop taking his fluid medication and states he understands he should not do this again.  States he monitors weights daily.  Weight this morning was 225.3 pounds.    Checks blood pressures daily, 122/78 (states this is what it has been ranging since discharge).  History of atrial fibrillation and states he is unaware of when his heart rate is elevated.  Reports compliance with medications and states he has been trying to decrease the sodium in his diet.  Reports nightly use of CPAP (awaiting new CPAP machine from [facility]). Case Manager Clinical Goal(s):  . Patient will verbalize understanding of plan for hypertension and heart failure management . Patient will report weighing self daily . Patient will not experience hospital admission. Hospital Admissions in last 6 months = 1 Interventions:  . Collaboration with Hunter, Stephen O, MD regarding  development and update of comprehensive plan of care as evidenced by provider attestation and co-signature . Inter-disciplinary care team collaboration (see longitudinal plan of care) . Evaluation of current treatment plan related to hypertension self management and patient's adherence to plan as established by provider. . Provided education to patient re: stroke prevention, s/s of heart attack and stroke, DASH diet, complications of uncontrolled blood pressure . Reviewed medications with patient and discussed importance of compliance . Discussed plans with patient for ongoing care management follow up and provided patient with direct contact information for care management team . Advised patient, providing education and rationale, to monitor blood pressure daily and record, calling PCP for findings outside established parameters.  . Basic overview and discussion of pathophysiology of Heart Failure reviewed  . Provided verbal education on low sodium diet and encouraged to follow a low sodium diet/DASH diet . Reviewed Heart Failure Action Plan in depth and provided written copy . Assessed need for readable accurate scales in home . Advised patient to weigh each morning after emptying bladder . Discussed importance of daily weight and advised patient to weigh and record daily . Reviewed role of diuretics in prevention of fluid overload and management of heart failure . Barriers to lifestyle changes reviewed and addressed, barriers to treatment reviewed and addressed . Cognitive screening completed and reviewed, depression screen reviewed . Healthy lifestyle promoted . Medication-adherence assessment completed; discussed medication cost and patient denies any trouble affording medications at this time . Heart failure rescue (action) plan reviewed . Self-awareness of signs/symptoms of worsening disease encouraged . Encouraged to attend all scheduled provider appointments . Sending Heart Failure and  Atrial Fibrillation EMMI video . Patient Goals/Self-Care Activities: . Call office if I gain more than 2 pounds in one day or 5 pounds in one week . Keep legs up while sitting . Use salt in moderation . Watch for swelling in feet, ankles and legs every day . Weigh myself daily and write in log for provider review . Take blood pressure daily and write in log for provider review . Notify provider for sustained blood pressure elevations or hypotension . Follow Up Plan: The care management team will reach out to the patient again over the next 30 business days.     Care Plan : Diabetes Type 2 (Adult)  Updates made by ,  D, RN since 02/02/2021 12:00 AM  Problem: Glycemic Management (Diabetes, Type 2)   Priority: Medium  Long-Range Goal: Patient will report maintaining Hgb A1C of below 6.5 within the next 180 days   Start Date: 02/02/2021  Expected End Date: 08/09/2021  Priority: Medium  Objective:  Lab Results  Component Value Date   HGBA1C 5.9 (H) 12/27/2020 .   Lab Results  Component Value Date   CREATININE 1.55 (H) 01/13/2021   CREATININE 1.37 (H) 01/07/2021     CREATININE 1.40 (H) 12/31/2020  Current Barriers:  . Knowledge Deficits related to basic Diabetes pathophysiology and self care/management; Monitors blood sugars about twice a day.  Fasting blood sugar this morning was 113 with recent fasting ranges of 110-120's.  Denies any recent episodes of hypo or hyperglycemia. Case Manager Clinical Goal(s):  . patient will demonstrate improved adherence to prescribed treatment plan for diabetes self care/management as evidenced by: daily monitoring and recording of CBG,  adherence to ADA/ carb modified diet, adherence to prescribed medication regimen, contacting provider for new or worsened symptoms or questions Interventions:  . Collaboration with Hunter, Stephen O, MD regarding development and update of comprehensive plan of care as evidenced by provider attestation and  co-signature . Inter-disciplinary care team collaboration (see longitudinal plan of care) . Provided education to patient about basic DM disease process . Reviewed medications with patient and discussed importance of medication adherence . Discussed plans with patient for ongoing care management follow up and provided patient with direct contact information for care management team . Advised patient, providing education and rationale, to check cbg at least daily and record, calling Endocrinologist for findings outside established parameters.   . Barriers to adherence to treatment plan identified . Blood glucose monitoring encouraged, blood glucose readings reviewed, and use of blood glucose monitoring log promoted . Individualized medical nutrition therapy provided, discussed and encouraged carbohydrate modified diabetic low sodium diet, discussed health food and drink options . Mutual A1C goal set or reviewed; congratulated on current Hgb A1C and discussed ways to maintain . Self-awareness of signs/symptoms of hypo or hyperglycemia encouraged . Encouraged to attends all scheduled provider appointments . Encouraged to schedule eye exam Patient Goals/Self-Care Activities: . Check blood sugar at least once or twice daily . Check blood sugar if I feel it is too high or too low . Enter blood sugar readings and medication into daily log . Take the blood sugar log to all doctor visits  . Reschedule eye examination Follow Up Plan: The care management team will reach out to the patient again over the next 30 business days.      Plan:The care management team will reach out to the patient again over the next 30 business days.    RN, MSN RN Care Management Coordinator  [facility] [phone] [email or web address]         

## 2021-02-10 ENCOUNTER — Other Ambulatory Visit: Payer: Self-pay | Admitting: Family Medicine

## 2021-02-16 ENCOUNTER — Ambulatory Visit (INDEPENDENT_AMBULATORY_CARE_PROVIDER_SITE_OTHER): Payer: Medicare Other | Admitting: Physician Assistant

## 2021-02-16 ENCOUNTER — Encounter: Payer: Self-pay | Admitting: Physician Assistant

## 2021-02-16 ENCOUNTER — Other Ambulatory Visit: Payer: Self-pay

## 2021-02-16 ENCOUNTER — Telehealth: Payer: Self-pay | Admitting: Physician Assistant

## 2021-02-16 VITALS — BP 112/80 | HR 82 | Ht 68.0 in | Wt 230.6 lb

## 2021-02-16 DIAGNOSIS — I2510 Atherosclerotic heart disease of native coronary artery without angina pectoris: Secondary | ICD-10-CM | POA: Diagnosis not present

## 2021-02-16 DIAGNOSIS — I482 Chronic atrial fibrillation, unspecified: Secondary | ICD-10-CM

## 2021-02-16 DIAGNOSIS — I5042 Chronic combined systolic (congestive) and diastolic (congestive) heart failure: Secondary | ICD-10-CM | POA: Diagnosis not present

## 2021-02-16 DIAGNOSIS — E785 Hyperlipidemia, unspecified: Secondary | ICD-10-CM

## 2021-02-16 DIAGNOSIS — I10 Essential (primary) hypertension: Secondary | ICD-10-CM

## 2021-02-16 DIAGNOSIS — N1832 Chronic kidney disease, stage 3b: Secondary | ICD-10-CM | POA: Diagnosis not present

## 2021-02-16 NOTE — Patient Instructions (Signed)
Medication Instructions:  Your physician recommends that you continue on your current medications as directed. Please refer to the Current Medication list given to you today.   *If you need a refill on your cardiac medications before your next appointment, please call your pharmacy*   Lab Work: None ordered  If you have labs (blood work) drawn today and your tests are completely normal, you will receive your results only by: Marland Kitchen MyChart Message (if you have MyChart) OR . A paper copy in the mail If you have any lab test that is abnormal or we need to change your treatment, we will call you to review the results.   Testing/Procedures: None ordered   Follow-Up: At [facility], you and your health needs are our priority.  As part of our continuing mission to provide you with exceptional heart care, we have created designated Provider Care Teams.  These Care Teams include your primary Cardiologist (physician) and Advanced Practice Providers (APPs -  Physician Assistants and Nurse Practitioners) who all work together to provide you with the care you need, when you need it.  We recommend signing up for the patient portal called "MyChart".  Sign up information is provided on this After Visit Summary.  MyChart is used to connect with patients for Virtual Visits (Telemedicine).  Patients are able to view lab/test results, encounter notes, upcoming appointments, etc.  Non-urgent messages can be sent to your provider as well.   To learn more about what you can do with MyChart, go to [email or web address].    Your next appointment:   6 month(s)  The format for your next appointment:   In Person  Provider:   You may see Sherren Mocha, MD or one of the following Advanced Practice Providers on your designated Care Team:    Richardson Dopp, PA-C  Robbie Lis, [place]    Other Instructions

## 2021-02-16 NOTE — Telephone Encounter (Signed)
   Patient's [name] noted to be missing from the med list he provided at [place] today. [address] off in April as expired med per EMR. Primary care's last notes indicated some family concern for dementia so seemed best to reach out to family after visit to clarify. Granddaughter Sharyn Lull listed on [place] -she is a Marine scientist. Tried to call her but got VM which is full. Sent MyChart message instead. In case they do not respond to the Mychart message, leaving this in my box to have our team reach out on Thursday by phone again. Maely Clements PA-C

## 2021-02-16 NOTE — Progress Notes (Signed)
Cardiology Office Note    Date:  02/16/2021   ID:  [name], [name] 06/13/1946, MRN [id number]  PCP:  Marin Olp, MD  Cardiologist:  Sherren Mocha, MD  Electrophysiologist:  Thompson Grayer, MD   Chief Complaint: f/u CAD, Afib, CHF  History of Present Illness:   [name] is a 75 y.o. male with history of CAD s/p DES to LAD 2004, STEMI 2008 2/2 very late stent thombosis s/p PTCA/DES to LAD, chronic Afib, chronic combined CHF, CKD IIIb (border a-b), OSA on CPAP, morbid obesity, HTN, HLD, former smoker, and Type 2 DM who presents for post-hospital follow-up. He has had varying LV function over the years - in 04/2018 his EF had declined from 40-45% to 25-30% and he underwent cath 05/2018 showing stable CAD with findings below with recommendation for medical management - obstructions noted were not amenable to PCI. He was seen by Dr. Rayann Heman in 2020 to consider ICD but the patient wished to defer. More recently he was admitted in 12/2020 with CHF and was diuresed. This was in the context of accelerating HTN and med noncompliance. 2D echo 12/28/20 showed poor acoustic windows, probably low normal EF 50-55% with severe hypokinesis of the anteroseptal wall, mild LVH, mildly dilated LA. He was continued on Pradaxa for afib and Plavix for CAD. Aspirin was stopped to reduce bleeding risk. He saw the CHF clinic back in follow-up 01/07/21 and was felt to be doing well. Primary care notes outline some family concern for dementia with mild cognitive impairment.  He is seen back for follow-up today doing well. He is here alone. He reports stable weight at home. He denies any accelerating SOB, DOE, orthopnea. He has mild sockline edema which he states is chronic and unchanged. In general he is pleased with how he is feeling. Denies any s/sx of GI/GU bleeding. His granddaughter is a Marine scientist on the women's floor at [facility] [employer] [facility] [employer].    Labwork independently reviewed: 01/13/21 K 4.5, Cr 1.55, Hgb normal, LFTs ok 3/202  BNP 296, TSH wnl, A1C 5.9 02/2020 LDL 12   Past Medical History:  Diagnosis Date  . B12 deficiency    per patient previously taking shots  . C. difficile colitis 04/17/2019  . C. difficile diarrhea   . CAD (coronary artery disease), native coronary artery 04/12/2007  . Chronic atrial fibrillation ([place])    initial diagnoses 2012  . Chronic combined systolic and diastolic CHF (congestive heart failure) ([place]) 04/15/2019  . CKD (chronic kidney disease), stage III ([place]) 04/15/2019  . CORONARY ARTERY DISEASE 04/12/2007   2 stents last in 2006.   Marland Kitchen Cystoid macular edema of right eye 01/16/2020  . Cystoid macular edema of right eye 01/16/2020  . DIABETES MELLITUS, TYPE II 04/16/2007  . Diverticulosis of colon (without mention of hemorrhage)   . GERD (gastroesophageal reflux disease) 08/09/2011  . Hyperlipidemia 04/16/2007   10/24 reveal study end. Atorvastatin 66m  . HYPERTENSION 04/12/2007  . HYPOTHYROIDISM 04/12/2007  . Ischemic cardiomyopathy   . Morbid obesity ([facility] 08/09/2011  . MYOCARDIAL INFARCTION, HX OF 04/12/2007  . Obstructive sleep apnea 04/12/2007   02/2011 - AHI 96/h CPAP 13, Lg FF     . Stroke ([facility]   . Type II diabetes mellitus with peripheral circulatory disorder ([place] 04/16/2007   Dr. GCruzita Lederermanages. Uses fructosamine.    .Marland KitchenULCERATIVE COLITIS, LEFT SIDED 11/23/2010  . Ulcerative pancolitis without complication ([facility] 136/14/4315  Ulcerative Colitis. Mesalamine.   .Marland KitchenVITAMIN B12 DEFICIENCY 11/29/2010  Past Surgical History:  Procedure Laterality Date  . CARDIAC CATHETERIZATION  10/2002   STENT. 2 stents Dr. Maurene Capes  . CARDIAC CATHETERIZATION N/A 10/28/2015   Procedure: Right/Left Heart Cath and Coronary Angiography;  Surgeon: Sherren Mocha, MD;  Location: [facility];  Service: Cardiovascular;  Laterality: N/A;  . CATARACT EXTRACTION     2021 bilateral eyes   . CORONARY STENT PLACEMENT  2008   LAD   . RIGHT/LEFT HEART CATH AND CORONARY ANGIOGRAPHY N/A 06/01/2018   Procedure:  RIGHT/LEFT HEART CATH AND CORONARY ANGIOGRAPHY;  Surgeon: Sherren Mocha, MD;  Location: [facility];  Service: Cardiovascular;  Laterality: N/A;  . TONSILLECTOMY      Current Medications: Current Meds  Medication Sig  . APRISO 0.375 g 24 hr capsule Take 4 capsules (1.5 g total) by mouth daily.  Marland Kitchen atorvastatin (LIPITOR) 20 MG tablet TAKE ONE TABLET (20MG TOTAL) BY MOUTH DAILY  . carvedilol (COREG) 25 MG tablet TAKE ONE TABLET BY MOUTH TWICE A DAY  . clopidogrel (PLAVIX) 75 MG tablet TAKE ONE TABLET (75MG TOTAL) BY MOUTH DAILY  . Coenzyme Q10 (COQ10 PO) Take 1 capsule by mouth daily with supper.  . dicyclomine (BENTYL) 10 MG capsule TAKE ONE CAPSULE (10MG TOTAL) BY MOUTH TWO TIMES DAILY AS NEEDED FOR DIARRHEA OR ABDOMINAL CRAMPING  . empagliflozin (JARDIANCE) 10 MG TABS tablet TAKE ONE (1) TABLET BY MOUTH EVERY DAY  . fish oil-omega-3 fatty acids 1000 MG capsule Take 1 g by mouth daily with supper.  . furosemide (LASIX) 40 MG tablet TAKE ONE TABLET (40MG TOTAL) BY MOUTH TWO TIMES DAILY  . glucose blood test strip Use as instructed to test 4 time daily  . isosorbide mononitrate (IMDUR) 60 MG 24 hr tablet TAKE ONE (1) TABLET BY MOUTH EVERY DAY. Please make yearly appt with [name] for June 2022 for future refills. Thank you 1st attempt  . metFORMIN (GLUCOPHAGE) 500 MG tablet Take 1 tablet (500 mg total) by mouth 2 (two) times daily with a meal.  . PRADAXA 150 MG CAPS capsule TAKE ONE (1) CAPSULE BY MOUTH TWICE A DAY. (EVERY 12 HOURS.)  . saccharomyces boulardii (FLORASTOR) 250 MG capsule Take 1 capsule (250 mg total) by mouth 2 (two) times daily.  . sitaGLIPtin (JANUVIA) 100 MG tablet TAKE ONE (1) TABLET BY MOUTH EVERY DAY  . spironolactone (ALDACTONE) 25 MG tablet TAKE ONE (1) TABLET BY MOUTH EVERY DAY  . SYNTHROID 50 MCG tablet TAKE ONE TABLET BY MOUTH DAILY BEFORE BREAKFAST  . triamcinolone (KENALOG) 0.1 % Apply 1 application topically 2 (two) times daily. For 7-10 days maximum       Allergies:   Clindamycin/lincomycin   Social History   Socioeconomic History  . Marital status: Married    Spouse name: Not on file  . Number of children: 1  . Years of education: Not on file  . Highest education level: Not on file  Occupational History  . Occupation: RETIRED    Employer: RETIRED  Tobacco Use  . Smoking status: Former Smoker    Packs/day: 1.00    Years: 5.00    Pack years: 5.00    Types: Cigarettes    Quit date: 04/13/1966    Years since quitting: 54.8  . Smokeless tobacco: Never Used  Vaping Use  . Vaping Use: Never used  Substance and Sexual Activity  . Alcohol use: No    Alcohol/week: 0.0 standard drinks    Comment: no more beer  . Drug use:  No  . Sexual activity: Not on file  Other Topics Concern  . Not on file  Social History Narrative   Cardiorehab 3 days a week. 45 minutes to an hour-stationary bike.    GRANDDAUGHTER (MS. 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       Married for 37 years in 2015, married previously for 14 years. Daughter with first wife and 3 grandkids.    Lives alone with wife. Get to see grandkids a lot. New grandchild in middle of 70      Hobbies-yardwork, previously liked to hunt and fish, does some target shooting   Social Determinants of Health   Financial Resource Strain: Low Risk   . Difficulty of Paying Living Expenses: Not hard at all  Food Insecurity: No Food Insecurity  . Worried About Charity fundraiser in the Last Year: Never true  . Ran Out of Food in the Last Year: Never true  Transportation Needs: No Transportation Needs  . Lack of Transportation (Medical): No  . Lack of Transportation (Non-Medical): No  Physical Activity: Inactive  . Days of Exercise per Week: 0 days  . Minutes of Exercise per Session: 0 min  Stress: No Stress Concern Present  . Feeling of Stress : Not at all  Social  Connections: Moderately Integrated  . Frequency of Communication with Friends and Family: More than three times a week  . Frequency of Social Gatherings with Friends and Family: More than three times a week  . Attends Religious Services: More than 4 times per year  . Active Member of Clubs or Organizations: No  . Attends Archivist Meetings: Never  . Marital Status: Married     Family History:  The patient's family history includes Heart attack in his mother; Heart disease in his father; Hypertension in his brother; Obesity in his brother; Stomach cancer in his maternal aunt and paternal grandmother. There is no history of Colon cancer.  ROS:   Please see the history of present illness.  All other systems are reviewed and otherwise negative.    EKGs/Labs/Other Studies Reviewed:    Studies reviewed are outlined and summarized above. Reports included below if pertinent.  2D echo 12/28/20 IMPRESSIONS    1. Poor acoustic windows limit study, even with the use of Definity.  Overall LVEF is probably low normal with severe hypokinesis of the  anteroseptal wall. Compared to echo from 04/29/18, LVEF is improved. . Left  ventricular ejection fraction, by  estimation, is 50 to 55%. The left ventricle has low normal function.  There is mild left ventricular hypertrophy. Left ventricular diastolic  parameters are indeterminate.  2. Right ventricular systolic function is normal. The right ventricular  size is normal.  3. Left atrial size was mildly dilated.  4. The mitral valve is normal in structure. No evidence of mitral valve  regurgitation.  5. The aortic valve is abnormal. Aortic valve regurgitation is not  visualized. Mild to moderate aortic valve sclerosis/calcification is  present, without any evidence of aortic stenosis.  6. The inferior vena cava is normal in size with <50% respiratory  variability, suggesting right atrial pressure of 8 mmHg.   Cath 05/2018  1st  Diag lesion is 100% stenosed.  Ost Ramus lesion is 50% stenosed.  Ost Cx lesion is 80% stenosed.  Ost 2nd Mrg to 2nd Mrg lesion  is 95% stenosed.  Mid RCA lesion is 25% stenosed.  Mid LM to Dist LM lesion is 25% stenosed.  Ost LAD to Prox LAD lesion is 20% stenosed.   1.  Widely patent left main, LAD, and left circumflex 2.  Severe ostial circumflex and ostial OM lesions unchanged from the previous cath study in 2017 3.  Slow flow in the first diagonal branch of the LAD, collateralized from the right PDA 4.  Well compensated left and right heart hemodynamics with low cardiac output calculated by Fick, possibly related to the Fick calculation with the patient's morbid obesity  Recommendations: Stable coronary anatomy, ongoing medical therapy for heart failure.  Left circumflex/obtuse marginal stenoses not approachable by PCI based on ostial lesion locations.      EKG:  EKG is not ordered today  Recent Labs: 12/27/2020: TSH 2.036 12/28/2020: Magnesium 2.4 01/07/2021: B Natriuretic Peptide 296.0 01/13/2021: ALT 14; BUN 25; Creatinine, Ser 1.55; Hemoglobin 16.9; Platelets 167.0; Potassium 4.5; Sodium 138  Recent Lipid Panel    Component Value Date/Time   CHOL 84 02/24/2020 1533   CHOL 74 (L) 02/07/2018 0859   TRIG 29 12/28/2020 0540   HDL 51.60 02/24/2020 1533   HDL 45 02/07/2018 0859   CHOLHDL 2 02/24/2020 1533   VLDL 21.0 02/24/2020 1533   LDLCALC 12 02/24/2020 1533   LDLCALC 13 02/07/2018 0859   LDLDIRECT 18.0 06/13/2017 1504    PHYSICAL EXAM:    VS:  BP 112/80   Pulse 82   Ht 5' 8"  (1.727 m)   Wt 230 lb 9.6 oz (104.6 kg)   SpO2 99%   BMI 35.06 kg/m   BMI: Body mass index is 35.06 kg/m.  GEN: Well nourished, well developed obese elderly male in no acute distress HEENT: normocephalic, atraumatic Neck: no JVD, carotid bruits, or masses Cardiac: irregularly irregular, rate controlled; no murmurs, rubs, or gallops, mild sockline edema  Respiratory:  clear to  auscultation bilaterally, normal work of breathing GI: soft, nontender, nondistended, + BS MS: no deformity or atrophy Skin: warm and dry, no rash Neuro:  Alert and Oriented x 3, Strength and sensation are intact, follows commands Psych: euthymic mood, full affect  Wt Readings from Last 3 Encounters:  02/16/21 230 lb 9.6 oz (104.6 kg)  01/27/21 228 lb 9.6 oz (103.7 kg)  01/13/21 228 lb 3.2 oz (103.5 kg)     ASSESSMENT & PLAN:   1. Chronic combined CHF - currently NYHA class II and clinically stable. LV function by last echo reported to be low normal around 50%. Will continue current regimen as outlined to include carvedilol, Lasix, spironolactone, isosorbide. He is supposed to be on Entresto but this is missing from med list - this was listed on discharge summary but appears prescription "ended" in EMR after the 30 day prescription. This was back on his medicine list when he saw Dr. Haroldine Laws. I tried to call granddaughter per [facility] but got her voicemail box which was full. MyChart message sent to help clarify. Otherwise reviewed 2g sodium restriction, 2L fluid restriction, daily weights with patient. Given his improved LVEF, do not need to revisit ICD discussion at this time.  2. CAD with HLD goal LDL <70 - doing well without recent angina. He's been maintained on long term Plavix by [name]. Bleeding precautions reviewed. Will otherwise continue BB, statin. He is not fasting today - fasting lipids can be obtained at next OV instead.  3. Chronic atrial fibrillation - he has been maintained on  Pradaxa by his primary cardiologist. His CrCl is around 83m/min per calculation so dose remains appropriate. Bleeding precautions reviewed. Otherwise rate controlled on current regimen. He reports compliance with CPAP per report.  4. Essential HTN - BP controlled. Continue present regimen. As above, hoping to clarify Entresto.  5. CKD stage III - most recent Cr has ranged in the 1.4-1.5 range. Continue  to monitor periodically. Consider obtaining BMET at next OV to ensure stable for Pradaxa.  Disposition: F/u with Dr. CBurt Knackin 6 months. Also has recall for f/u Dr. BHaroldine Lawsin 06/2021.  Medication Adjustments/Labs and Tests Ordered: Current medicines are reviewed at length with the patient today.  Concerns regarding medicines are outlined above. Medication changes, Labs and Tests ordered today are summarized above and listed in the Patient Instructions accessible in Encounters.   Signed, DCharlie Pitter PA-C  02/16/2021 4:10 PM    [facility] [address] [place]   [id number]: [phone] Fax: [phone]

## 2021-02-18 NOTE — Telephone Encounter (Addendum)
Left a message for the patients granddaughter, Chilton Greathouse, to help update the patients med list: Entresto.

## 2021-02-18 NOTE — Telephone Encounter (Signed)
Hi triage, see message below. Patient seen in the office this week - Entresto noted to be missing from med list. Sent Mychart message since patient stated granddaughter is a Marine scientist and manages his account, but did not yet get reply. Can you try to call the patient / family to clarify if he is taking this? His PCP's note indicates some prior family concern for memory issue which is why I wanted to clarify if he was taking this when he had the bottles in front of him. Thx.

## 2021-02-22 NOTE — Telephone Encounter (Signed)
Please review MAR w/ pt to make sure accurate with our records. Thanks!

## 2021-02-23 NOTE — Telephone Encounter (Signed)
See 02/16/21 MyChart messages.

## 2021-03-02 ENCOUNTER — Other Ambulatory Visit: Payer: Self-pay | Admitting: Internal Medicine

## 2021-03-02 ENCOUNTER — Telehealth: Payer: Self-pay | Admitting: *Deleted

## 2021-03-02 ENCOUNTER — Telehealth: Payer: Medicare Other

## 2021-03-02 NOTE — Addendum Note (Signed)
Addended by: Gaetano Net on: 03/02/2021 01:29 PM   Modules accepted: Orders

## 2021-03-02 NOTE — Telephone Encounter (Signed)
  Care Management   Follow Up Note   03/02/2021 Name: [name] MRN: [id number] DOB: 08/18/1946   Referred by: Marin Olp, MD Reason for referral : Chronic Care Management (CHF, HTN, DM)   An unsuccessful telephone outreach was attempted today. The patient was referred to the case management team for assistance with care management and care coordination.   Follow Up Plan: RNCM will seek assistance from [facility] in rescheduling this appointment within the next 30 days if no call back from patient.  Hubert Azure RN, MSN RN Care Management Coordinator  [place] [phone] [email or web address]

## 2021-03-02 NOTE — Patient Instructions (Addendum)
Health Maintenance Due  Topic Date Due  . TETANUS/TDAP Please check with your local pharmacy.  09/22/2020  . OPHTHALMOLOGY EXAM Patient will reschedule. Have eye doctor send [place] a copy 11/10/2020   Keep neurology visit in July but im very encouraged today by your memory test  Please stop by lab before you go If you have mychart- we will send your results within 3 business days of [place] receiving them.  If you do not have mychart- we will call you about results within 5 business days of [place] receiving them.  *please also note that you will see labs on mychart as soon as they post. I will later go in and write notes on them- will say "notes from Dr. Yong Channel"

## 2021-03-02 NOTE — Progress Notes (Signed)
Phone [phone] In person visit   Subjective:   [name] is a 75 y.o. year old very pleasant male patient who presents for/with See problem oriented charting Chief Complaint  Patient presents with  . Hypertension  . Hypothyroidism  . Diabetes    This visit occurred during the SARS-CoV-2 public health emergency.  Safety protocols were in place, including screening questions prior to the visit, additional usage of staff PPE, and extensive cleaning of exam room while observing appropriate contact time as indicated for disinfecting solutions.   Past Medical History-  Patient Active Problem List   Diagnosis Date Noted  . History of transient ischemic attack (TIA) 10/24/2015    Priority: High  . CHF (congestive heart failure), NYHA class II ([place]) 05/09/2014    Priority: High  . Ulcerative pancolitis without complication ([place]) 97/98/9211    Priority: High  . Atrial fibrillation ([place]) 12/08/2010    Priority: High  . Type II diabetes mellitus with peripheral circulatory disorder ([place]) 04/16/2007    Priority: High  . CAD (coronary artery disease), native coronary artery 04/12/2007    Priority: High  . Morbid obesity ([place]) 08/09/2011    Priority: Medium  . Hyperlipidemia 04/16/2007    Priority: Medium  . Hypothyroidism 04/12/2007    Priority: Medium  . Essential hypertension 04/12/2007    Priority: Medium  . Obstructive sleep apnea 04/12/2007    Priority: Medium  . Former smoker 08/11/2016    Priority: Low  . MCI (mild cognitive impairment) 01/19/2016    Priority: Low  . Primary osteoarthritis of left knee 02/09/2015    Priority: Low  . Allergic rhinitis 06/25/2012    Priority: Low  . GERD (gastroesophageal reflux disease) 08/09/2011    Priority: Low  . VITAMIN B12 DEFICIENCY 11/29/2010    Priority: Low  . Lactose intolerance 01/06/2021  . Fecal incontinence 01/06/2021  . Acute on chronic respiratory failure with hypoxia ([place]) 12/27/2020  . Atrial fibrillation  with RVR ([place])   . Acute encephalopathy   . Moderate nonproliferative diabetic retinopathy of both eyes ([place]) 01/16/2020  . Atherosclerotic heart disease of native coronary artery with other forms of angina pectoris ([place]) 04/26/2019  . C. difficile colitis 04/17/2019  . Dehydration 04/15/2019  . Diarrhea 04/15/2019  . ARF (acute renal failure) ([place]) 04/15/2019  . Chronic combined systolic and diastolic CHF (congestive heart failure) ([place]) 04/15/2019  . Hypokalemia 04/15/2019  . AKI (acute kidney injury) ([place])   . Acute on chronic combined systolic and diastolic heart failure ([place]) 02/07/2018    Medications- reviewed and updated Current Outpatient Medications  Medication Sig Dispense Refill  . APRISO 0.375 g 24 hr capsule Take 4 capsules (1.5 g total) by mouth daily. 120 capsule 5  . atorvastatin (LIPITOR) 20 MG tablet TAKE ONE TABLET (20MG TOTAL) BY MOUTH DAILY 90 tablet 3  . clopidogrel (PLAVIX) 75 MG tablet TAKE ONE TABLET (75MG TOTAL) BY MOUTH DAILY 90 tablet 3  . Coenzyme Q10 (COQ10 PO) Take 1 capsule by mouth daily with supper.    . dicyclomine (BENTYL) 10 MG capsule TAKE ONE CAPSULE (10MG TOTAL) BY MOUTH TWO TIMES DAILY AS NEEDED FOR DIARRHEA OR ABDOMINAL CRAMPING 90 capsule 3  . empagliflozin (JARDIANCE) 10 MG TABS tablet TAKE ONE (1) TABLET BY MOUTH EVERY DAY 90 tablet 3  . fish oil-omega-3 fatty acids 1000 MG capsule Take 1 g by mouth daily with supper.    . furosemide (LASIX) 40 MG tablet TAKE ONE TABLET (40MG TOTAL) BY MOUTH TWO  TIMES DAILY 60 tablet 0  . glucose blood test strip Use as instructed to test 4 time daily 400 each 3  . isosorbide mononitrate (IMDUR) 60 MG 24 hr tablet TAKE ONE (1) TABLET BY MOUTH EVERY DAY. Please make yearly appt with [name] for June 2022 for future refills. Thank you 1st attempt 90 tablet 0  . metFORMIN (GLUCOPHAGE) 500 MG tablet Take 1 tablet (500 mg total) by mouth 2 (two) times daily with a meal. 180 tablet 3  . saccharomyces boulardii  (FLORASTOR) 250 MG capsule Take 1 capsule (250 mg total) by mouth 2 (two) times daily.    . sacubitril-valsartan (ENTRESTO) 97-103 MG Take 1 tablet by mouth 2 (two) times daily.    . sitaGLIPtin (JANUVIA) 100 MG tablet TAKE ONE (1) TABLET BY MOUTH EVERY DAY 90 tablet 3  . spironolactone (ALDACTONE) 25 MG tablet TAKE ONE (1) TABLET BY MOUTH EVERY DAY 90 tablet 3  . SYNTHROID 50 MCG tablet TAKE ONE TABLET BY MOUTH DAILY BEFORE BREAKFAST 90 tablet 3  . triamcinolone (KENALOG) 0.1 % Apply 1 application topically 2 (two) times daily. For 7-10 days maximum 80 g 0  . vitamin B-12 (CYANOCOBALAMIN) 500 MCG tablet Take 1,000 mcg by mouth daily.     No current facility-administered medications for this visit.  Also on carvedilol 25 mg twice daily-not clear whether this is fallen off medication list but appears was being refilled today by cardiology   Objective:  BP 101/66   Pulse 73   Temp 98 F (36.7 C) (Temporal)   Ht 5' 8"  (1.727 m)   Wt 229 lb (103.9 kg)   SpO2 100%   BMI 34.82 kg/m  Gen: NAD, resting comfortably  CV: RRR  Lungs: nonlabored, normal respiratory rate Abdomen: soft/nondistended  No significant edema      Assessment and Plan   #Memory loss.  Concern-family was concerned noncompliance could be affected by dementia which may have led to hospitalization.  We will place referral to neurology at last visit-plan was to complete MMSE today if had not seen neurology and consider Aricept.  B12 normal within 8 months, TSH normal within 2 months, CT of the brain without obvious mass or stroke  -Patient has made some significant improvements in memory since getting out of the hospital -Family has put his medications in a single container and wife monitors compliance-he has done well lately -still hiding pull ups in cabinet which is atypical but on the other hand patient states he simply wants to reduce his number of steps and likes to remove them all at once and wife does not want him to  place in trash can in the bathroom apparently - Patient's family would like for an acute visit with neurology but I told him I was very impressed by his 30 out of 30 on MMSE today and since his compliance is better I am okay spacing visit out to 6 months  #Congestive heart failure with ejection fraction of 25% during hospitalization 2022 S: Medication: Lasix 24 mg twice daily, Aldactone 25 mg, Entresto, Jardiance, carvedilol -Weight is within 1 pound of last visit -No increased edema reported -Also some concern for underlying pulmonary disease with polycythemia- pulmonology visit planned-he saw pulmonology and they are trying to set him up with AutoPap A/P: Appears stable/euvolemic-continue current medications    #%CAD/atrial fibrillation/hyperlipidemia. Follows with Dr. Rayann Heman of cardiology S: Compliant with atorvastatin 11m-LDL has been at goal under 748 I have prescribed his Plavix-with his CAD  history-he is compliant with this medication today.  He is also on Pradaxa for anticoagulation due to atrial fibrillation-reports compliance today has been excellent.  Patient reports cardiology wants him on both medications.  Patient is on carvedilol for rate control  No reported chest pain or shortness of breath Lab Results  Component Value Date   CHOL 84 02/24/2020   HDL 51.60 02/24/2020   LDLCALC 12 02/24/2020   LDLDIRECT 18.0 06/13/2017   TRIG 29 12/28/2020   CHOLHDL 2 02/24/2020   A/P: CAD appears asymptomatic.  Atrial fibrillation is appropriately rate controlled and anticoagulated.  Continue current medications  #hypertension S: Compliant with Coreg 25 mg twice a day, Imdur 60 mg, Entresto, spironolactone 25 mg, Lasix 40 mg. Not lightheaded/dizzy BP Readings from Last 3 Encounters:  03/03/21 101/66  02/16/21 112/80  01/27/21 128/62  A/P: Well-controlled-patient not asymptomatic blood pressure running lower- ideally would remain on these medications-continue current medication  #%  Hypothyroidism S: Compliant with Synthroid 42 mcg-most recently filled by Dr. Cruzita Lederer Lab Results  Component Value Date   TSH 2.036 12/27/2020  A/P: Well-controlled on recent check-update TSH - I am willing to prescribe Synthroid if needed   % Ulcerative colitis S: Followed by GI- compliant with mesalamine-seeing new provider in [place] A/P: Patient denies any concerns-continue current medication  #Diabetes-followed by Dr. Cruzita Lederer  S: A1c was well controlled recently on metformin 500 mg twice daily   And Januvia 100 mg Lab Results  Component Value Date   HGBA1C 5.9 (H) 12/27/2020   HGBA1C 6.3 (A) 07/08/2020   HGBA1C 6.8 (H) 02/24/2020   A/P: Well-controlled-continue current medication  # Low b12- on sublingual b12- slightly high on most recent check-continue 1000 mcg daily to avoid lows Lab Results  Component Value Date   VITAMINB12 1,256 (H) 09/02/2020   #Mild thrombocytopenia-resolved on most recent labs Lab Results  Component Value Date   WBC 9.6 01/13/2021   HGB 16.9 01/13/2021   HCT 49.5 01/13/2021   MCV 88.8 01/13/2021   PLT 167.0 01/13/2021   Recommended follow up: 6 months discussed or sooner if needed Future Appointments  Date Time Provider [place]  04/21/2021 11:30 AM Garvin Fila, MD [facility] None  07/09/2021 11:00 AM Mahala Menghini, PA-C [facility]  07/14/2021 10:00 AM Philemon Kingdom, MD [facility] None  11/12/2021  3:15 PM [facility] HEALTH COACH [facility]    Lab/Order associations:   ICD-10-CM   1. Memory loss  R41.3   2. Essential hypertension  I10   3. Coronary artery disease involving native coronary artery of native heart without angina pectoris  I25.10   4. Hypothyroidism, unspecified type  E03.9   5. Hyperlipidemia, unspecified hyperlipidemia type  E78.5 Comprehensive metabolic panel    Lipid panel  6. Type II diabetes mellitus with peripheral circulatory disorder (HCC)  E11.51      Return precautions advised.  Garret Reddish, MD

## 2021-03-03 ENCOUNTER — Telehealth: Payer: Self-pay | Admitting: *Deleted

## 2021-03-03 ENCOUNTER — Encounter: Payer: Self-pay | Admitting: Family Medicine

## 2021-03-03 ENCOUNTER — Ambulatory Visit (INDEPENDENT_AMBULATORY_CARE_PROVIDER_SITE_OTHER): Payer: Medicare Other | Admitting: Family Medicine

## 2021-03-03 ENCOUNTER — Other Ambulatory Visit: Payer: Self-pay

## 2021-03-03 VITALS — BP 101/66 | HR 73 | Temp 98.0°F | Ht 68.0 in | Wt 229.0 lb

## 2021-03-03 DIAGNOSIS — E785 Hyperlipidemia, unspecified: Secondary | ICD-10-CM

## 2021-03-03 DIAGNOSIS — E039 Hypothyroidism, unspecified: Secondary | ICD-10-CM

## 2021-03-03 DIAGNOSIS — I10 Essential (primary) hypertension: Secondary | ICD-10-CM

## 2021-03-03 DIAGNOSIS — R413 Other amnesia: Secondary | ICD-10-CM

## 2021-03-03 DIAGNOSIS — I2510 Atherosclerotic heart disease of native coronary artery without angina pectoris: Secondary | ICD-10-CM | POA: Diagnosis not present

## 2021-03-03 DIAGNOSIS — E1151 Type 2 diabetes mellitus with diabetic peripheral angiopathy without gangrene: Secondary | ICD-10-CM | POA: Diagnosis not present

## 2021-03-03 LAB — COMPREHENSIVE METABOLIC PANEL
ALT: 13 U/L (ref 0–53)
AST: 15 U/L (ref 0–37)
Albumin: 4.1 g/dL (ref 3.5–5.2)
Alkaline Phosphatase: 80 U/L (ref 39–117)
BUN: 24 mg/dL — ABNORMAL HIGH (ref 6–23)
CO2: 33 mEq/L — ABNORMAL HIGH (ref 19–32)
Calcium: 9 mg/dL (ref 8.4–10.5)
Chloride: 99 mEq/L (ref 96–112)
Creatinine, Ser: 1.3 mg/dL (ref 0.40–1.50)
GFR: 53.89 mL/min — ABNORMAL LOW (ref >60.00)
Glucose, Bld: 139 mg/dL — ABNORMAL HIGH (ref 70–99)
Potassium: 4.3 mEq/L (ref 3.5–5.1)
Sodium: 138 mEq/L (ref 135–145)
Total Bilirubin: 1.2 mg/dL (ref 0.2–1.2)
Total Protein: 6.6 g/dL (ref 6.0–8.3)

## 2021-03-03 LAB — LIPID PANEL
Cholesterol: 79 mg/dL (ref 0–200)
HDL: 55.9 mg/dL (ref >39.00)
LDL Cholesterol: 6 mg/dL (ref 0–99)
NonHDL: 23.42
Total CHOL/HDL Ratio: 1
Triglycerides: 86 mg/dL (ref 0.0–149.0)
VLDL: 17.2 mg/dL (ref 0.0–40.0)

## 2021-03-03 NOTE — Chronic Care Management (AMB) (Signed)
  Care Management   Note  03/03/2021 Name: [name] MRN: [id number] DOB: November 13, 1945  [name] is a 75 y.o. year old male who is a primary care patient of Marin Olp, MD and is actively engaged with the care management team. I reached out to [name] by phone today to assist with re-scheduling a follow up visit with the RN Case Manager  Follow up plan: Unsuccessful telephone outreach attempt made. A HIPAA compliant phone message was left for the patient providing contact information and requesting a return call.  The care management team will reach out to the patient again over the next 7 days.  If patient returns call to provider office, please advise to call [place] at [facility]

## 2021-03-03 NOTE — Telephone Encounter (Signed)
Pt's pharmacy is requesting a refill on carvedilol. This medication was D/C off of pt's medication list. Does pt still supposed to be taking this medication? Please address

## 2021-03-04 NOTE — Telephone Encounter (Signed)
[name] patient returned call to me today we rescheduled 6/14 he has my number to RS if he can not make telephone call.  Erline Levine

## 2021-03-04 NOTE — Chronic Care Management (AMB) (Signed)
  Care Management   Note  03/04/2021 Name: [name] MRN: [id number] DOB: 04/30/1946  [name] is a 75 y.o. year old male who is a primary care patient of Marin Olp, MD and is actively engaged with the care management team. [name] returned call by phone today to assist with re-scheduling a follow up visit with the RN Case Manager  Follow up plan: Telephone appointment with care management team member scheduled for:03/23/2021  [facility]

## 2021-03-10 ENCOUNTER — Other Ambulatory Visit: Payer: Self-pay | Admitting: Internal Medicine

## 2021-03-10 ENCOUNTER — Other Ambulatory Visit: Payer: Self-pay | Admitting: Cardiovascular Disease

## 2021-03-10 NOTE — Telephone Encounter (Signed)
Pt's medication carvedilol was D/C off of pt's medication list. Does pt supposed to still be taking this medication? Please address

## 2021-03-17 DIAGNOSIS — Z23 Encounter for immunization: Secondary | ICD-10-CM | POA: Diagnosis not present

## 2021-03-23 ENCOUNTER — Ambulatory Visit (INDEPENDENT_AMBULATORY_CARE_PROVIDER_SITE_OTHER): Payer: Medicare Other | Admitting: *Deleted

## 2021-03-23 DIAGNOSIS — E1151 Type 2 diabetes mellitus with diabetic peripheral angiopathy without gangrene: Secondary | ICD-10-CM | POA: Diagnosis not present

## 2021-03-23 DIAGNOSIS — I5042 Chronic combined systolic (congestive) and diastolic (congestive) heart failure: Secondary | ICD-10-CM | POA: Diagnosis not present

## 2021-03-23 NOTE — Patient Instructions (Signed)
Visit Information  PATIENT GOALS:  Goals Addressed             This Visit's Progress    (RNCM) Monitor and Manage My Blood Sugar-Diabetes Type 2   On track    Timeframe:  Long-Range Goal Priority:  Medium Start Date:   02/02/21                          Expected End Date: 10/09/21                      Follow Up Date 04/1921    Check blood sugar at least once or twice daily Check blood sugar if I feel it is too high or too low Enter blood sugar readings and medication into daily log Take the blood sugar log to all doctor visits  Reschedule eye examination   Why is this important?   Checking your blood sugar at home helps to keep it from getting very high or very low.  Writing the results in a diary or log helps the doctor know how to care for you.  Your blood sugar log should have the time, date and the results.  Also, write down the amount of insulin or other medicine that you take.  Other information, like what you ate, exercise done and how you were feeling, will also be helpful.     Notes:       (RNCM) Track and Manage Fluids and Swelling-Heart Failure   On track    Timeframe:  Long-Range Goal Priority:  High Start Date:  02/02/21                           Expected End Date:   10/09/21                    Follow Up Date 04/27/21    Call office if I gain more than 2 pounds in one day or 5 pounds in one week Keep legs up while sitting Use salt in moderation Watch for swelling in feet, ankles and legs every day Weigh myself daily and write in log for provider review Consider obtaining new blood pressure cuff (patient does not think his cuff is working properly) Take blood pressure daily and write in log for provider review Notify provider for sustained blood pressure elevations or hypotension   Why is this important?   It is important to check your weight daily and watch how much salt and liquids you have.  It will help you to manage your heart failure.    Notes:           Patient verbalizes understanding of instructions provided today and agrees to view in [place].   The care management team will reach out to the patient again over the next 45 business days.   Hubert Azure RN, MSN RN Care Management Coordinator  [place] [phone] [email or web address]

## 2021-03-23 NOTE — Chronic Care Management (AMB) (Signed)
Chronic Care Management   CCM RN Visit Note  03/23/2021 Name: [name] MRN: [id number] DOB: September 19, 1946  Subjective: [name] is a 75 y.o. year old male who is a primary care patient of Yong Channel, Brayton Mars, MD. The care management team was consulted for assistance with disease management and care coordination needs.    Engaged with patient by telephone for follow up visit in response to provider referral for case management and/or care coordination services.   Consent to Services:  The patient was given information about Chronic Care Management services, agreed to services, and gave verbal consent prior to initiation of services.  Please see initial visit note for detailed documentation.   Patient agreed to services and verbal consent obtained.   Assessment: Review of patient past medical history, allergies, medications, health status, including review of consultants reports, laboratory and other test data, was performed as part of comprehensive evaluation and provision of chronic care management services.   SDOH (Social Determinants of Health) assessments and interventions performed:    CCM Care Plan  Allergies  Allergen Reactions   Clindamycin/Lincomycin     Developed C. difficile colitis 1 month after use    Outpatient Encounter Medications as of 03/23/2021  Medication Sig   empagliflozin (JARDIANCE) 10 MG TABS tablet TAKE ONE (1) TABLET BY MOUTH EVERY DAY   furosemide (LASIX) 40 MG tablet TAKE ONE TABLET (40MG TOTAL) BY MOUTH TWO TIMES DAILY   sitaGLIPtin (JANUVIA) 100 MG tablet TAKE ONE (1) TABLET BY MOUTH EVERY DAY   APRISO 0.375 g 24 hr capsule Take 4 capsules (1.5 g total) by mouth daily.   atorvastatin (LIPITOR) 20 MG tablet TAKE ONE TABLET (20MG TOTAL) BY MOUTH DAILY   carvedilol (COREG) 25 MG tablet TAKE ONE TABLET BY MOUTH TWICE A DAY   clopidogrel (PLAVIX) 75 MG tablet TAKE ONE TABLET (75MG TOTAL) BY MOUTH DAILY   Coenzyme Q10 (COQ10 PO) Take 1 capsule by  mouth daily with supper.   dicyclomine (BENTYL) 10 MG capsule TAKE ONE CAPSULE (10MG TOTAL) BY MOUTH TWO TIMES DAILY AS NEEDED FOR DIARRHEA OR ABDOMINAL CRAMPING   fish oil-omega-3 fatty acids 1000 MG capsule Take 1 g by mouth daily with supper.   glucose blood test strip Use as instructed to test 4 time daily   isosorbide mononitrate (IMDUR) 60 MG 24 hr tablet TAKE ONE (1) TABLET BY MOUTH EVERY DAY   metFORMIN (GLUCOPHAGE) 500 MG tablet Take 1 tablet (500 mg total) by mouth 2 (two) times daily with a meal.   saccharomyces boulardii (FLORASTOR) 250 MG capsule Take 1 capsule (250 mg total) by mouth 2 (two) times daily.   sacubitril-valsartan (ENTRESTO) 97-103 MG Take 1 tablet by mouth 2 (two) times daily.   spironolactone (ALDACTONE) 25 MG tablet TAKE ONE (1) TABLET BY MOUTH EVERY DAY   SYNTHROID 50 MCG tablet TAKE ONE TABLET BY MOUTH DAILY BEFORE BREAKFAST   triamcinolone (KENALOG) 0.1 % Apply 1 application topically 2 (two) times daily. For 7-10 days maximum   vitamin B-12 (CYANOCOBALAMIN) 500 MCG tablet Take 1,000 mcg by mouth daily.   No facility-administered encounter medications on file as of 03/23/2021.    Patient Active Problem List   Diagnosis Date Noted   Lactose intolerance 01/06/2021   Fecal incontinence 01/06/2021   Acute on chronic respiratory failure with hypoxia ([place]) 12/27/2020   Atrial fibrillation with RVR (HCC)    Acute encephalopathy    Moderate nonproliferative diabetic retinopathy of both eyes ([place]) 01/16/2020  Atherosclerotic heart disease of native coronary artery with other forms of angina pectoris ([place]) 04/26/2019   C. difficile colitis 04/17/2019   Dehydration 04/15/2019   Diarrhea 04/15/2019   ARF (acute renal failure) ([place]) 04/15/2019   Chronic combined systolic and diastolic CHF (congestive heart failure) ([place]) 04/15/2019   Hypokalemia 04/15/2019   AKI (acute kidney injury) ([place])    Acute on chronic combined systolic and diastolic heart failure ([place])  02/07/2018   Former smoker 08/11/2016   MCI (mild cognitive impairment) 01/19/2016   History of transient ischemic attack (TIA) 10/24/2015   Primary osteoarthritis of left knee 02/09/2015   CHF (congestive heart failure), NYHA class II ([place]) 05/09/2014   Allergic rhinitis 06/25/2012   Ulcerative pancolitis without complication ([place]) 94/17/4081   GERD (gastroesophageal reflux disease) 08/09/2011   Morbid obesity ([place]) 08/09/2011   Atrial fibrillation ([place]) 12/08/2010   VITAMIN B12 DEFICIENCY 11/29/2010   Type II diabetes mellitus with peripheral circulatory disorder ([place]) 04/16/2007   Hyperlipidemia 04/16/2007   Hypothyroidism 04/12/2007   Essential hypertension 04/12/2007   CAD (coronary artery disease), native coronary artery 04/12/2007   Obstructive sleep apnea 04/12/2007    Conditions to be addressed/monitored:CHF and DMII  Care Plan : Heart Failure (Adult)  Updates made by Leona Singleton, RN since 03/23/2021 12:00 AM   Problem: Symptom Exacerbation (Heart Failure)   Priority: High   Long-Range Goal: Patient will report no hospital readmissions related to heart failure within the next 90 days   Start Date: 02/02/2021  Expected End Date: 10/09/2021  This Visit's Progress: On track  Priority: High  Objective:  Last practice recorded BP readings:  BP Readings from Last 3 Encounters:  03/03/21 101/66  02/16/21 112/80  01/27/21 128/62  Current Barriers:  Knowledge deficit related to basic heart failure and hypertension pathophysiology and self care management as evidenced by recent hospitalization related to hypertension and heart failure exacerbation in March.  Patient states he has been doing much better.  States he monitors weights daily.  Weight this morning was 226.7 pounds.  Admits to not checking blood pressures at home, states he does not think his machine is accurate.  Discussed possibility of obtaining new blood pressure machine.  Denies any shortness of breath, but  does report some lower extremity edema in left foot that is chronic in nature per patient.  Reports compliance with medications and states he has been trying to decrease the sodium in his diet.   Case Manager Clinical Goal(s):  Patient will verbalize understanding of plan for hypertension and heart failure management Patient will report weighing self daily Patient will not experience hospital admission. Hospital Admissions in last 6 months = 1 Interventions:  Collaboration with Marin Olp, MD regarding development and update of comprehensive plan of care as evidenced by provider attestation and co-signature Inter-disciplinary care team collaboration (see longitudinal plan of care) Evaluation of current treatment plan related to hypertension self management and patient's adherence to plan as established by provider. Provided education to patient re: stroke prevention, s/s of heart attack and stroke, DASH diet, complications of uncontrolled blood pressure Reviewed medications with patient and discussed importance of compliance Discussed plans with patient for ongoing care management follow up and provided patient with direct contact information for care management team Advised patient, providing education and rationale, to monitor blood pressure daily and record, calling PCP for findings outside established parameters.  Basic overview and discussion of pathophysiology of Heart Failure reviewed  Provided verbal education on low sodium diet and  encouraged to follow a low sodium diet/DASH diet Reviewed Heart Failure Action Plan in depth  Assessed need for readable accurate scales in home Advised patient to weigh each morning after emptying bladder Discussed importance of daily weight and advised patient to weigh and record daily Reviewed role of diuretics in prevention of fluid overload and management of heart failure Barriers to lifestyle changes reviewed and addressed, barriers to treatment  reviewed and addressed Cognitive screening completed and reviewed and healthy lifestyle promoted Heart failure rescue (action) plan reviewed Discussed and encouraged 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 Self-awareness of signs/symptoms of worsening disease encouraged Encouraged to attend all scheduled provider appointments No significant changes/recommendations Patient Goals/Self-Care Activities: Call office if I gain more than 2 pounds in one day or 5 pounds in one week Keep legs up while sitting Use salt in moderation Watch for swelling in feet, ankles and legs every day Weigh myself daily and write in log for provider review Consider obtaining new blood pressure cuff (patient does not think his cuff is working properly) Take blood pressure daily and write in log for provider review Notify provider for sustained blood pressure elevations or hypotension Follow Up Plan: The care management team will reach out to the patient again over the next 45 business days.      Care Plan : Diabetes Type 2 (Adult)  Updates made by Leona Singleton, RN since 03/23/2021 12:00 AM   Problem: Glycemic Management (Diabetes, Type 2)   Priority: Medium   Long-Range Goal: Patient will report maintaining Hgb A1C of below 6.5 within the next 180 days   Start Date: 02/02/2021  Expected End Date: 08/09/2021  This Visit's Progress: On track  Priority: Medium  Objective:  Lab Results  Component Value Date   HGBA1C 5.9 (H) 12/27/2020   Lab Results  Component Value Date   CREATININE 1.30 03/03/2021   CREATININE 1.55 (H) 01/13/2021   CREATININE 1.37 (H) 01/07/2021  Current Barriers:  Knowledge Deficits related to basic Diabetes pathophysiology and self care/management; Monitors blood sugars about twice a day.  Has not checked fasting blood sugar this morning yet with recent fasting ranges of 110's.  Denies any recent episodes of hypo or hyperglycemia. Case Manager Clinical Goal(s):  patient will  demonstrate improved adherence to prescribed treatment plan for diabetes self care/management as evidenced by: daily monitoring and recording of CBG,  adherence to ADA/ carb modified diet, adherence to prescribed medication regimen, contacting provider for new or worsened symptoms or questions Interventions:  Collaboration with Marin Olp, MD regarding development and update of comprehensive plan of care as evidenced by provider attestation and co-signature Inter-disciplinary care team collaboration (see longitudinal plan of care) Provided education to patient about basic DM disease process Reviewed medications with patient and discussed importance of medication adherence Discussed plans with patient for ongoing care management follow up and provided patient with direct contact information for care management team Advised patient, providing education and rationale, to check cbg at least daily and record, calling Endocrinologist for findings outside established parameters.   Barriers to adherence to treatment plan identified Blood glucose monitoring encouraged, blood glucose readings reviewed, and use of blood glucose monitoring log promoted Individualized medical nutrition therapy provided, discussed and encouraged carbohydrate modified diabetic low sodium diet, discussed healthy food and drink options Mutual A1C goal set or reviewed; congratulated on current Hgb A1C and discussed ways to maintain Self-awareness of signs/symptoms of hypo or hyperglycemia encouraged Encouraged to attends all scheduled provider appointments Encouraged to  schedule eye exam No significant changes/recommendations Patient Goals/Self-Care Activities: Check blood sugar at least once or twice daily Check blood sugar if I feel it is too high or too low Enter blood sugar readings and medication into daily log Take the blood sugar log to all doctor visits  Reschedule eye examination Follow Up Plan: The care  management team will reach out to the patient again over the next 45 business days.      Plan:The care management team will reach out to the patient again over the next 45 business days.  Hubert Azure RN, MSN RN Care Management Coordinator  [place] [phone] [email or web address]

## 2021-04-07 ENCOUNTER — Other Ambulatory Visit: Payer: Self-pay | Admitting: Family Medicine

## 2021-04-21 ENCOUNTER — Institutional Professional Consult (permissible substitution): Payer: Medicare Other | Admitting: Neurology

## 2021-04-27 ENCOUNTER — Ambulatory Visit (INDEPENDENT_AMBULATORY_CARE_PROVIDER_SITE_OTHER): Payer: Medicare Other | Admitting: *Deleted

## 2021-04-27 DIAGNOSIS — E1151 Type 2 diabetes mellitus with diabetic peripheral angiopathy without gangrene: Secondary | ICD-10-CM | POA: Diagnosis not present

## 2021-04-27 DIAGNOSIS — I5042 Chronic combined systolic (congestive) and diastolic (congestive) heart failure: Secondary | ICD-10-CM | POA: Diagnosis not present

## 2021-04-27 NOTE — Chronic Care Management (AMB) (Signed)
Chronic Care Management   CCM RN Visit Note  04/27/2021 Name: [name] MRN: [id number] DOB: May 03, 1946  Subjective: [name] is a 75 y.o. year old male who is a primary care patient of Yong Channel, Brayton Mars, MD. The care management team was consulted for assistance with disease management and care coordination needs.    Engaged with patient by telephone for follow up visit in response to provider referral for case management and/or care coordination services.   Consent to Services:  The patient was given information about Chronic Care Management services, agreed to services, and gave verbal consent prior to initiation of services.  Please see initial visit note for detailed documentation.   Patient agreed to services and verbal consent obtained.   Assessment: Review of patient past medical history, allergies, medications, health status, including review of consultants reports, laboratory and other test data, was performed as part of comprehensive evaluation and provision of chronic care management services.   SDOH (Social Determinants of Health) assessments and interventions performed:    CCM Care Plan  Allergies  Allergen Reactions   Clindamycin/Lincomycin     Developed C. difficile colitis 1 month after use    Outpatient Encounter Medications as of 04/27/2021  Medication Sig   empagliflozin (JARDIANCE) 10 MG TABS tablet TAKE ONE (1) TABLET BY MOUTH EVERY DAY   furosemide (LASIX) 40 MG tablet TAKE ONE TABLET (40MG TOTAL) BY MOUTH TWO TIMES DAILY   sacubitril-valsartan (ENTRESTO) 97-103 MG Take 1 tablet by mouth 2 (two) times daily.   sitaGLIPtin (JANUVIA) 100 MG tablet TAKE ONE (1) TABLET BY MOUTH EVERY DAY   spironolactone (ALDACTONE) 25 MG tablet TAKE ONE (1) TABLET BY MOUTH EVERY DAY   APRISO 0.375 g 24 hr capsule Take 4 capsules (1.5 g total) by mouth daily.   atorvastatin (LIPITOR) 20 MG tablet TAKE ONE TABLET (20MG TOTAL) BY MOUTH DAILY   carvedilol (COREG) 25 MG  tablet TAKE ONE TABLET BY MOUTH TWICE A DAY   clopidogrel (PLAVIX) 75 MG tablet TAKE ONE TABLET (75MG TOTAL) BY MOUTH DAILY   Coenzyme Q10 (COQ10 PO) Take 1 capsule by mouth daily with supper.   dicyclomine (BENTYL) 10 MG capsule TAKE ONE CAPSULE (10MG TOTAL) BY MOUTH TWO TIMES DAILY AS NEEDED FOR DIARRHEA OR ABDOMINAL CRAMPING   fish oil-omega-3 fatty acids 1000 MG capsule Take 1 g by mouth daily with supper.   glucose blood test strip Use as instructed to test 4 time daily   isosorbide mononitrate (IMDUR) 60 MG 24 hr tablet TAKE ONE (1) TABLET BY MOUTH EVERY DAY   metFORMIN (GLUCOPHAGE) 500 MG tablet Take 1 tablet (500 mg total) by mouth 2 (two) times daily with a meal.   saccharomyces boulardii (FLORASTOR) 250 MG capsule Take 1 capsule (250 mg total) by mouth 2 (two) times daily.   SYNTHROID 50 MCG tablet TAKE ONE TABLET BY MOUTH DAILY BEFORE BREAKFAST   triamcinolone (KENALOG) 0.1 % Apply 1 application topically 2 (two) times daily. For 7-10 days maximum   vitamin B-12 (CYANOCOBALAMIN) 500 MCG tablet Take 1,000 mcg by mouth daily.   No facility-administered encounter medications on file as of 04/27/2021.    Patient Active Problem List   Diagnosis Date Noted   Lactose intolerance 01/06/2021   Fecal incontinence 01/06/2021   Acute on chronic respiratory failure with hypoxia ([place]) 12/27/2020   Atrial fibrillation with RVR (HCC)    Acute encephalopathy    Moderate nonproliferative diabetic retinopathy of both eyes ([place]) 01/16/2020  Atherosclerotic heart disease of native coronary artery with other forms of angina pectoris ([place]) 04/26/2019   C. difficile colitis 04/17/2019   Dehydration 04/15/2019   Diarrhea 04/15/2019   ARF (acute renal failure) ([place]) 04/15/2019   Chronic combined systolic and diastolic CHF (congestive heart failure) ([place]) 04/15/2019   Hypokalemia 04/15/2019   AKI (acute kidney injury) ([place])    Acute on chronic combined systolic and diastolic heart failure ([place])  02/07/2018   Former smoker 08/11/2016   MCI (mild cognitive impairment) 01/19/2016   History of transient ischemic attack (TIA) 10/24/2015   Primary osteoarthritis of left knee 02/09/2015   CHF (congestive heart failure), NYHA class II ([place]) 05/09/2014   Allergic rhinitis 06/25/2012   Ulcerative pancolitis without complication ([place]) 01/75/1025   GERD (gastroesophageal reflux disease) 08/09/2011   Morbid obesity ([place]) 08/09/2011   Atrial fibrillation ([place]) 12/08/2010   VITAMIN B12 DEFICIENCY 11/29/2010   Type II diabetes mellitus with peripheral circulatory disorder ([place]) 04/16/2007   Hyperlipidemia 04/16/2007   Hypothyroidism 04/12/2007   Essential hypertension 04/12/2007   CAD (coronary artery disease), native coronary artery 04/12/2007   Obstructive sleep apnea 04/12/2007    Conditions to be addressed/monitored:CHF and DMII  Care Plan : Heart Failure (Adult)  Updates made by Leona Singleton, RN since 04/27/2021 12:00 AM     Problem: Symptom Exacerbation (Heart Failure)   Priority: High     Long-Range Goal: Patient will report no hospital readmissions related to heart failure within the next 90 days   Start Date: 02/02/2021  Expected End Date: 10/09/2021  Recent Progress: On track  Priority: High  Note:   Objective:  Last practice recorded BP readings:  BP Readings from Last 3 Encounters:  03/03/21 101/66  02/16/21 112/80  01/27/21 128/62  Current Barriers:  Knowledge deficit related to basic heart failure and hypertension pathophysiology and self care management as evidenced by recent hospitalization related to hypertension and heart failure exacerbation in March.  Patient states he has been doing much better.  States he monitors weights daily.  Weight this morning was 224.3 pounds; BP 110-120/70-80's.  Denies any shortness of breath, but does report some lower extremity edema in left foot that is chronic in nature per patient.  Reports compliance with medications and  states he has been trying to decrease the sodium in his diet.   Case Manager Clinical Goal(s):  Patient will verbalize understanding of plan for hypertension and heart failure management Patient will report weighing self daily Patient will not experience hospital admission. Hospital Admissions in last 6 months = 1 Interventions:  Collaboration with Marin Olp, MD regarding development and update of comprehensive plan of care as evidenced by provider attestation and co-signature Inter-disciplinary care team collaboration (see longitudinal plan of care) Evaluation of current treatment plan related to hypertension self management and patient's adherence to plan as established by provider. Provided education to patient re: stroke prevention, s/s of heart attack and stroke, DASH diet, complications of uncontrolled blood pressure Reviewed medications with patient and discussed importance of compliance Discussed plans with patient for ongoing care management follow up and provided patient with direct contact information for care management team Advised patient, providing education and rationale, to monitor blood pressure daily and record, calling PCP for findings outside established parameters.  Basic overview and discussion of pathophysiology of Heart Failure reviewed  Provided verbal education on low sodium diet and encouraged to follow a low sodium diet/DASH diet Reviewed Heart Failure Action Plan in depth  Assessed need for  readable accurate scales in home Advised patient to weigh each morning after emptying bladder Discussed importance of daily weight and advised patient to weigh and record daily Reviewed role of diuretics in prevention of fluid overload and management of heart failure Barriers to lifestyle changes reviewed and addressed, barriers to treatment reviewed and addressed Cognitive screening completed and reviewed and healthy lifestyle promoted Heart failure rescue (action) plan  reviewed Discussed and encouraged 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 Self-awareness of signs/symptoms of worsening disease encouraged Encouraged to attend all scheduled provider appointments No significant changes/recommendations Patient Goals/Self-Care Activities: Call office if I gain more than 2 pounds in one day or 5 pounds in one week Keep legs up while sitting Use salt in moderation Watch for swelling in feet, ankles and legs every day Weigh myself daily and write in log for provider review Take blood pressure daily and write in log for provider review Notify provider for sustained blood pressure elevations or hypotension Follow Up Plan: The care management team will reach out to the patient again over the next 45 business days.      Care Plan : Diabetes Type 2 (Adult)  Updates made by Leona Singleton, RN since 04/27/2021 12:00 AM     Problem: Glycemic Management (Diabetes, Type 2)   Priority: Medium     Long-Range Goal: Patient will report maintaining Hgb A1C of below 6.5 within the next 180 days   Start Date: 02/02/2021  Expected End Date: 08/09/2021  This Visit's Progress: On track  Recent Progress: On track  Priority: Medium  Note:   Objective:  Lab Results  Component Value Date   HGBA1C 5.9 (H) 12/27/2020  Current Barriers:  Knowledge Deficits related to basic Diabetes pathophysiology and self care/management; Monitors blood sugars about twice a day.  Fasting blood sugar this morning was 109 with recent ranges of 100-110's.  Denies any recent episodes of hypo or hyperglycemia. Case Manager Clinical Goal(s):  patient will demonstrate improved adherence to prescribed treatment plan for diabetes self care/management as evidenced by: daily monitoring and recording of CBG,  adherence to ADA/ carb modified diet, adherence to prescribed medication regimen, contacting provider for new or worsened symptoms or questions Interventions:  Collaboration with Marin Olp, MD regarding development and update of comprehensive plan of care as evidenced by provider attestation and co-signature Inter-disciplinary care team collaboration (see longitudinal plan of care) Provided education to patient about basic DM disease process Reviewed medications with patient and discussed importance of medication adherence Discussed plans with patient for ongoing care management follow up and provided patient with direct contact information for care management team Advised patient, providing education and rationale, to check cbg at least daily and record, calling Endocrinologist for findings outside established parameters.   Barriers to adherence to treatment plan identified Blood glucose monitoring encouraged, blood glucose readings reviewed, and use of blood glucose monitoring log promoted Individualized medical nutrition therapy provided, discussed and encouraged carbohydrate modified diabetic low sodium diet, discussed healthy food and drink options Mutual A1C goal set or reviewed; congratulated on current Hgb A1C and discussed ways to maintain Self-awareness of signs/symptoms of hypo or hyperglycemia encouraged Encouraged to attends all scheduled provider appointments Encouraged to schedule eye exam as soon as possible No significant changes/recommendations Patient Goals/Self-Care Activities: Check blood sugar at least once or twice daily Check blood sugar if I feel it is too high or too low Enter blood sugar readings and medication into daily log Take the blood sugar log to  all doctor visits  Reschedule eye examination Follow Up Plan: The care management team will reach out to the patient again over the next 45 business days.        Plan:The care management team will reach out to the patient again over the next 45 business days.  Hubert Azure RN, MSN RN Care Management Coordinator  [place] [phone] [email or web address]

## 2021-04-27 NOTE — Patient Instructions (Signed)
Visit Information  PATIENT GOALS:  Goals Addressed             This Visit's Progress    (RNCM) Monitor and Manage My Blood Sugar-Diabetes Type 2   On track    Timeframe:  Long-Range Goal Priority:  Medium Start Date:   02/02/21                          Expected End Date: 10/09/21                      Follow Up Date 06/01/21    Check blood sugar at least once or twice daily Check blood sugar if I feel it is too high or too low Enter blood sugar readings and medication into daily log Take the blood sugar log to all doctor visits  Reschedule eye examination   Why is this important?   Checking your blood sugar at home helps to keep it from getting very high or very low.  Writing the results in a diary or log helps the doctor know how to care for you.  Your blood sugar log should have the time, date and the results.  Also, write down the amount of insulin or other medicine that you take.  Other information, like what you ate, exercise done and how you were feeling, will also be helpful.     Notes:      (RNCM) Track and Manage Fluids and Swelling-Heart Failure   On track    Timeframe:  Long-Range Goal Priority:  Medium Start Date:  02/02/21                           Expected End Date:   10/09/21                    Follow Up Date 06/01/21    Call office if I gain more than 2 pounds in one day or 5 pounds in one week Keep legs up while sitting Use salt in moderation Watch for swelling in feet, ankles and legs every day Weigh myself daily and write in log for provider review Take blood pressure daily and write in log for provider review Notify provider for sustained blood pressure elevations or hypotension   Why is this important?   It is important to check your weight daily and watch how much salt and liquids you have.  It will help you to manage your heart failure.    Notes:         Patient verbalizes understanding of instructions provided today and agrees to view in  [place].   The care management team will reach out to the patient again over the next 45 business days.   Hubert Azure RN, MSN RN Care Management Coordinator  [place] [phone] [email or web address]

## 2021-04-30 ENCOUNTER — Other Ambulatory Visit: Payer: Self-pay | Admitting: Gastroenterology

## 2021-04-30 DIAGNOSIS — A0472 Enterocolitis due to Clostridium difficile, not specified as recurrent: Secondary | ICD-10-CM

## 2021-04-30 DIAGNOSIS — K5100 Ulcerative (chronic) pancolitis without complications: Secondary | ICD-10-CM

## 2021-04-30 DIAGNOSIS — R197 Diarrhea, unspecified: Secondary | ICD-10-CM

## 2021-05-26 ENCOUNTER — Other Ambulatory Visit: Payer: Self-pay | Admitting: Cardiovascular Disease

## 2021-06-01 ENCOUNTER — Telehealth: Payer: Medicare Other

## 2021-06-03 ENCOUNTER — Ambulatory Visit (INDEPENDENT_AMBULATORY_CARE_PROVIDER_SITE_OTHER): Payer: Medicare Other | Admitting: *Deleted

## 2021-06-03 DIAGNOSIS — E1151 Type 2 diabetes mellitus with diabetic peripheral angiopathy without gangrene: Secondary | ICD-10-CM

## 2021-06-03 DIAGNOSIS — I5022 Chronic systolic (congestive) heart failure: Secondary | ICD-10-CM

## 2021-06-03 NOTE — Patient Instructions (Signed)
Visit Information  PATIENT GOALS:  Goals Addressed             This Visit's Progress    (RNCM) Monitor and Manage My Blood Sugar-Diabetes Type 2   On track    Timeframe:  Long-Range Goal Priority:  Medium Start Date:   02/02/21                          Expected End Date: 10/09/21                      Follow Up Date 07/08/21    Check blood sugar at least once or twice daily Check blood sugar if I feel it is too high or too low Enter blood sugar readings and medication into daily log Take the blood sugar log to all doctor visits  Reschedule eye examination   Why is this important?   Checking your blood sugar at home helps to keep it from getting very high or very low.  Writing the results in a diary or log helps the doctor know how to care for you.  Your blood sugar log should have the time, date and the results.  Also, write down the amount of insulin or other medicine that you take.  Other information, like what you ate, exercise done and how you were feeling, will also be helpful.     Notes:      (RNCM) Track and Manage Fluids and Swelling-Heart Failure   On track    Timeframe:  Long-Range Goal Priority:  Medium Start Date:  02/02/21                           Expected End Date:   10/09/21                    Follow Up Date 07/08/21    Call office if I gain more than 2 pounds in one day or 5 pounds in one week Keep legs up while sitting Watch for swelling in feet, ankles and legs every day Weigh myself daily and write in log for provider review Take blood pressure daily and write in log for provider review Notify provider for sustained blood pressure elevations or hypotension   Why is this important?   It is important to check your weight daily and watch how much salt and liquids you have.  It will help you to manage your heart failure.    Notes:         Patient verbalizes understanding of instructions provided today and agrees to view in [place].   The care  management team will reach out to the patient again over the next 45 business days.   Hubert Azure RN, MSN RN Care Management Coordinator  [place] [phone] [email or web address]

## 2021-06-03 NOTE — Chronic Care Management (AMB) (Signed)
Chronic Care Management   CCM RN Visit Note  06/03/2021 Name: [name] MRN: [id number] DOB: 13-May-1946  Subjective: [name] is a 75 y.o. year old male who is a primary care patient of Yong Channel, Brayton Mars, MD. The care management team was consulted for assistance with disease management and care coordination needs.    Engaged with patient by telephone for follow up visit in response to provider referral for case management and/or care coordination services.   Consent to Services:  The patient was given information about Chronic Care Management services, agreed to services, and gave verbal consent prior to initiation of services.  Please see initial visit note for detailed documentation.   Patient agreed to services and verbal consent obtained.   Assessment: Review of patient past medical history, allergies, medications, health status, including review of consultants reports, laboratory and other test data, was performed as part of comprehensive evaluation and provision of chronic care management services.   SDOH (Social Determinants of Health) assessments and interventions performed:    CCM Care Plan  Allergies  Allergen Reactions   Clindamycin/Lincomycin     Developed C. difficile colitis 1 month after use    Outpatient Encounter Medications as of 06/03/2021  Medication Sig   empagliflozin (JARDIANCE) 10 MG TABS tablet TAKE ONE (1) TABLET BY MOUTH EVERY DAY   furosemide (LASIX) 40 MG tablet TAKE ONE TABLET (40MG TOTAL) BY MOUTH TWO TIMES DAILY   metFORMIN (GLUCOPHAGE) 500 MG tablet Take 1 tablet (500 mg total) by mouth 2 (two) times daily with a meal.   sacubitril-valsartan (ENTRESTO) 97-103 MG TAKE ONE TABLET BY MOUTH TWICE A DAY   spironolactone (ALDACTONE) 25 MG tablet TAKE ONE (1) TABLET BY MOUTH EVERY DAY   APRISO 0.375 g 24 hr capsule Take 4 capsules (1.5 g total) by mouth daily.   atorvastatin (LIPITOR) 20 MG tablet TAKE ONE TABLET (20MG TOTAL) BY MOUTH DAILY    carvedilol (COREG) 25 MG tablet TAKE ONE TABLET BY MOUTH TWICE A DAY   clopidogrel (PLAVIX) 75 MG tablet TAKE ONE TABLET (75MG TOTAL) BY MOUTH DAILY   Coenzyme Q10 (COQ10 PO) Take 1 capsule by mouth daily with supper.   dicyclomine (BENTYL) 10 MG capsule TAKE ONE CAPSULE (10MG TOTAL) BY MOUTH TWO TIMES DAILY AS NEEDED FOR DIARRHEA OR ABDOMINAL CRAMPING   fish oil-omega-3 fatty acids 1000 MG capsule Take 1 g by mouth daily with supper.   glucose blood test strip Use as instructed to test 4 time daily   isosorbide mononitrate (IMDUR) 60 MG 24 hr tablet TAKE ONE (1) TABLET BY MOUTH EVERY DAY   saccharomyces boulardii (FLORASTOR) 250 MG capsule Take 1 capsule (250 mg total) by mouth 2 (two) times daily.   sitaGLIPtin (JANUVIA) 100 MG tablet TAKE ONE (1) TABLET BY MOUTH EVERY DAY   SYNTHROID 50 MCG tablet TAKE ONE TABLET BY MOUTH DAILY BEFORE BREAKFAST   triamcinolone (KENALOG) 0.1 % Apply 1 application topically 2 (two) times daily. For 7-10 days maximum   vitamin B-12 (CYANOCOBALAMIN) 500 MCG tablet Take 1,000 mcg by mouth daily.   No facility-administered encounter medications on file as of 06/03/2021.    Patient Active Problem List   Diagnosis Date Noted   Lactose intolerance 01/06/2021   Fecal incontinence 01/06/2021   Acute on chronic respiratory failure with hypoxia ([place]) 12/27/2020   Atrial fibrillation with RVR (HCC)    Acute encephalopathy    Moderate nonproliferative diabetic retinopathy of both eyes ([place]) 01/16/2020   Atherosclerotic  heart disease of native coronary artery with other forms of angina pectoris ([place]) 04/26/2019   C. difficile colitis 04/17/2019   Dehydration 04/15/2019   Diarrhea 04/15/2019   ARF (acute renal failure) ([place]) 04/15/2019   Chronic combined systolic and diastolic CHF (congestive heart failure) ([place]) 04/15/2019   Hypokalemia 04/15/2019   AKI (acute kidney injury) ([place])    Acute on chronic combined systolic and diastolic heart failure ([place]) 02/07/2018    Former smoker 08/11/2016   MCI (mild cognitive impairment) 01/19/2016   History of transient ischemic attack (TIA) 10/24/2015   Primary osteoarthritis of left knee 02/09/2015   CHF (congestive heart failure), NYHA class II ([place]) 05/09/2014   Allergic rhinitis 06/25/2012   Ulcerative pancolitis without complication ([place]) 66/59/9357   GERD (gastroesophageal reflux disease) 08/09/2011   Morbid obesity ([place]) 08/09/2011   Atrial fibrillation ([place]) 12/08/2010   VITAMIN B12 DEFICIENCY 11/29/2010   Type II diabetes mellitus with peripheral circulatory disorder ([place]) 04/16/2007   Hyperlipidemia 04/16/2007   Hypothyroidism 04/12/2007   Essential hypertension 04/12/2007   CAD (coronary artery disease), native coronary artery 04/12/2007   Obstructive sleep apnea 04/12/2007    Conditions to be addressed/monitored:CHF, HTN, and DMII  Care Plan : Heart Failure (Adult)  Updates made by Leona Singleton, RN since 06/03/2021 12:00 AM     Problem: Symptom Exacerbation (Heart Failure)   Priority: High     Long-Range Goal: Patient will report no hospital readmissions related to heart failure within the next 90 days   Start Date: 02/02/2021  Expected End Date: 10/09/2021  This Visit's Progress: On track  Recent Progress: On track  Priority: High  Note:   Objective:  Last practice recorded BP readings:  BP Readings from Last 3 Encounters:  03/03/21 101/66  02/16/21 112/80  01/27/21 128/62  Current Barriers:  Knowledge deficit related to basic heart failure and hypertension pathophysiology and self care management as evidenced by recent hospitalization related to hypertension and heart failure exacerbation in March.  Patient states he has been doing much better.  States he monitors weights daily.  Weight this morning was 228 pounds; BP 119/75.  Denies any shortness of breath, but does report some lower extremity edema in left foot that is chronic in nature per patient.  Reports compliance with  medications and states he has been trying to decrease the sodium in his diet.   Case Manager Clinical Goal(s):  Patient will verbalize understanding of plan for hypertension and heart failure management Patient will report weighing self daily Patient will not experience hospital admission. Hospital Admissions in last 6 months = 1 Interventions:  Collaboration with Marin Olp, MD regarding development and update of comprehensive plan of care as evidenced by provider attestation and co-signature Inter-disciplinary care team collaboration (see longitudinal plan of care) Evaluation of current treatment plan related to hypertension self management and patient's adherence to plan as established by provider. Provided education to patient re: stroke prevention, s/s of heart attack and stroke, DASH diet, complications of uncontrolled blood pressure Reviewed medications with patient and discussed importance of compliance Discussed plans with patient for ongoing care management follow up and provided patient with direct contact information for care management team Advised patient, providing education and rationale, to monitor blood pressure daily and record, calling PCP for findings outside established parameters.  Basic overview and discussion of pathophysiology of Heart Failure reviewed  Provided verbal education on low sodium diet and encouraged to follow a low sodium diet/DASH diet Reviewed Heart Failure Action Plan  in depth  Assessed need for readable accurate scales in home Advised patient to weigh each morning after emptying bladder Discussed importance of daily weight and advised patient to weigh and record daily Reviewed role of diuretics in prevention of fluid overload and management of heart failure Barriers to lifestyle changes reviewed and addressed, barriers to treatment reviewed and addressed Cognitive screening completed and reviewed and healthy lifestyle promoted Heart failure  rescue (action) plan reviewed Self-awareness of signs/symptoms of worsening disease encouraged Encouraged to attend all scheduled provider appointments No significant changes/recommendations Patient Goals/Self-Care Activities: Call office if I gain more than 2 pounds in one day or 5 pounds in one week Keep legs up while sitting Watch for swelling in feet, ankles and legs every day Weigh myself daily and write in log for provider review Take blood pressure daily and write in log for provider review Notify provider for sustained blood pressure elevations or hypotension Follow Up Plan: The care management team will reach out to the patient again over the next 45 business days.      Care Plan : Diabetes Type 2 (Adult)  Updates made by Leona Singleton, RN since 06/03/2021 12:00 AM     Problem: Glycemic Management (Diabetes, Type 2)   Priority: Medium     Long-Range Goal: Patient will report maintaining Hgb A1C of below 6.5 within the next 180 days   Start Date: 02/02/2021  Expected End Date: 08/09/2021  This Visit's Progress: On track  Recent Progress: On track  Priority: Medium  Note:   Objective:  Lab Results  Component Value Date   HGBA1C 5.9 (H) 12/27/2020  Current Barriers:  Knowledge Deficits related to basic Diabetes pathophysiology and self care/management; Monitors blood sugars about twice a day.  Fasting blood sugar this morning was 132 with recent ranges of 100-110's.  Denies any recent episodes of hypo or hyperglycemia. Case Manager Clinical Goal(s):  patient will demonstrate improved adherence to prescribed treatment plan for diabetes self care/management as evidenced by: daily monitoring and recording of CBG,  adherence to ADA/ carb modified diet, adherence to prescribed medication regimen, contacting provider for new or worsened symptoms or questions Interventions:  Collaboration with Marin Olp, MD regarding development and update of comprehensive plan of care  as evidenced by provider attestation and co-signature Inter-disciplinary care team collaboration (see longitudinal plan of care) Provided education to patient about basic DM disease process Reviewed medications with patient and discussed importance of medication adherence Discussed plans with patient for ongoing care management follow up and provided patient with direct contact information for care management team Advised patient, providing education and rationale, to check cbg at least daily and record, calling Endocrinologist for findings outside established parameters.   Barriers to adherence to treatment plan identified Blood glucose monitoring encouraged, blood glucose readings reviewed, and use of blood glucose monitoring log promoted Individualized medical nutrition therapy provided, discussed and encouraged carbohydrate modified diabetic low sodium diet, discussed healthy food and drink options Mutual A1C goal set or reviewed; congratulated on current Hgb A1C and discussed ways to maintain Self-awareness of signs/symptoms of hypo or hyperglycemia encouraged Encouraged to attends all scheduled provider appointments Encouraged to schedule eye exam as soon as possible (offered to assist in scheduling patient declines at this time) No significant changes/recommendations Patient Goals/Self-Care Activities: Check blood sugar at least once or twice daily Check blood sugar if I feel it is too high or too low Enter blood sugar readings and medication into daily log Take the blood  sugar log to all doctor visits  Reschedule eye examination Follow Up Plan: The care management team will reach out to the patient again over the next 45 business days.        Plan:The care management team will reach out to the patient again over the next 45 business days.  Hubert Azure RN, MSN RN Care Management Coordinator  [facility] [phone] [email or web address]

## 2021-06-16 ENCOUNTER — Other Ambulatory Visit: Payer: Self-pay | Admitting: Family Medicine

## 2021-06-16 ENCOUNTER — Other Ambulatory Visit: Payer: Self-pay | Admitting: Cardiovascular Disease

## 2021-06-16 DIAGNOSIS — E785 Hyperlipidemia, unspecified: Secondary | ICD-10-CM

## 2021-06-21 ENCOUNTER — Other Ambulatory Visit: Payer: Self-pay | Admitting: Physician Assistant

## 2021-06-30 ENCOUNTER — Other Ambulatory Visit: Payer: Self-pay

## 2021-06-30 ENCOUNTER — Ambulatory Visit (INDEPENDENT_AMBULATORY_CARE_PROVIDER_SITE_OTHER): Payer: Medicare Other | Admitting: Ophthalmology

## 2021-06-30 ENCOUNTER — Encounter (INDEPENDENT_AMBULATORY_CARE_PROVIDER_SITE_OTHER): Payer: Self-pay | Admitting: Ophthalmology

## 2021-06-30 DIAGNOSIS — E113393 Type 2 diabetes mellitus with moderate nonproliferative diabetic retinopathy without macular edema, bilateral: Secondary | ICD-10-CM

## 2021-06-30 DIAGNOSIS — H26492 Other secondary cataract, left eye: Secondary | ICD-10-CM | POA: Diagnosis not present

## 2021-06-30 DIAGNOSIS — I2510 Atherosclerotic heart disease of native coronary artery without angina pectoris: Secondary | ICD-10-CM | POA: Diagnosis not present

## 2021-06-30 DIAGNOSIS — H35351 Cystoid macular degeneration, right eye: Secondary | ICD-10-CM

## 2021-06-30 NOTE — Assessment & Plan Note (Signed)
Moderate nonproliferative diabetic retinopathy OU remained stable  The nature of moderate nonproliferative diabetic retinopathy was discussed with the patient as well as the need for more frequent follow up to judge for progression. Good blood glucose, blood pressure, and serum lipid control was recommended as well as avoidance of smoking and maintenance of normal weight.  Close follow up with PCP encouraged.

## 2021-06-30 NOTE — Assessment & Plan Note (Signed)
Some visual axis impact by cloudy capsule.  Suggest follow-up with Dr. Rutherford Guys for evaluation possible YAG laser

## 2021-06-30 NOTE — Progress Notes (Signed)
06/30/2021     CHIEF COMPLAINT Patient presents for  Chief Complaint  Patient presents with   Retina Follow Up      HISTORY OF PRESENT ILLNESS: [name] is a 75 y.o. male who presents to the clinic today for:   HPI     Retina Follow Up   Patient presents with  Other.  In both eyes.  This started 1 year ago.  Duration of 1 year.        Comments   1 year f/u OU with OCT  Pt states he missed his last appointment due to being hospitalized from not taking his medications properly. Pt denies any visual changes since previous visit. Pt denies any new flashes or floaters. Pt denies any eye pain. Pt requests his visit information be sent to his endocrinologist, Dr. Cruzita Lederer.  Type 2 Diabetes.  A1c: 5.3  Eye Meds:  None      Last edited by Reather Littler, COA on 06/30/2021  1:49 PM.      Referring physician: Rutherford Guys, [place],  [place] [address]  HISTORICAL INFORMATION:   Selected notes from the MEDICAL RECORD NUMBER    Lab Results  Component Value Date   HGBA1C 5.9 (H) 12/27/2020     CURRENT MEDICATIONS: No current outpatient medications on file. (Ophthalmic Drugs)   No current facility-administered medications for this visit. (Ophthalmic Drugs)   Current Outpatient Medications (Other)  Medication Sig   APRISO 0.375 g 24 hr capsule Take 4 capsules (1.5 g total) by mouth daily.   atorvastatin (LIPITOR) 20 MG tablet TAKE ONE TABLET (20MG TOTAL) BY MOUTH DAILY   carvedilol (COREG) 25 MG tablet TAKE ONE TABLET BY MOUTH TWICE A DAY   clopidogrel (PLAVIX) 75 MG tablet TAKE ONE TABLET (75MG TOTAL) BY MOUTH DAILY   Coenzyme Q10 (COQ10 PO) Take 1 capsule by mouth daily with supper.   dicyclomine (BENTYL) 10 MG capsule TAKE ONE CAPSULE (10MG TOTAL) BY MOUTH TWO TIMES DAILY AS NEEDED FOR DIARRHEA OR ABDOMINAL CRAMPING   empagliflozin (JARDIANCE) 10 MG TABS tablet TAKE ONE (1) TABLET BY MOUTH EVERY DAY   fish oil-omega-3 fatty acids 1000  MG capsule Take 1 g by mouth daily with supper.   furosemide (LASIX) 40 MG tablet TAKE ONE TABLET (40MG TOTAL) BY MOUTH TWO TIMES DAILY   glucose blood test strip Use as instructed to test 4 time daily   isosorbide mononitrate (IMDUR) 60 MG 24 hr tablet TAKE ONE (1) TABLET BY MOUTH EVERY DAY   metFORMIN (GLUCOPHAGE) 500 MG tablet Take 1 tablet (500 mg total) by mouth 2 (two) times daily with a meal.   saccharomyces boulardii (FLORASTOR) 250 MG capsule Take 1 capsule (250 mg total) by mouth 2 (two) times daily.   sacubitril-valsartan (ENTRESTO) 97-103 MG TAKE ONE TABLET BY MOUTH TWICE A DAY   sitaGLIPtin (JANUVIA) 100 MG tablet TAKE ONE (1) TABLET BY MOUTH EVERY DAY   spironolactone (ALDACTONE) 25 MG tablet TAKE ONE (1) TABLET BY MOUTH EVERY DAY   SYNTHROID 50 MCG tablet TAKE ONE TABLET BY MOUTH DAILY BEFORE BREAKFAST   triamcinolone (KENALOG) 0.1 % Apply 1 application topically 2 (two) times daily. For 7-10 days maximum   vitamin B-12 (CYANOCOBALAMIN) 500 MCG tablet Take 1,000 mcg by mouth daily.   No current facility-administered medications for this visit. (Other)      REVIEW OF SYSTEMS:    ALLERGIES Allergies  Allergen Reactions   Clindamycin/Lincomycin  Developed C. difficile colitis 1 month after use    PAST MEDICAL HISTORY Past Medical History:  Diagnosis Date   B12 deficiency    per patient previously taking shots   C. difficile colitis 04/17/2019   C. difficile diarrhea    CAD (coronary artery disease), native coronary artery 04/12/2007   Chronic atrial fibrillation ([place])    initial diagnoses 2012   Chronic combined systolic and diastolic CHF (congestive heart failure) ([place]) 04/15/2019   CKD (chronic kidney disease), stage III ([place]) 04/15/2019   CORONARY ARTERY DISEASE 04/12/2007   2 stents last in 2006.    Cystoid macular edema of right eye 01/16/2020   Cystoid macular edema of right eye 01/16/2020   DIABETES MELLITUS, TYPE II 04/16/2007   Diverticulosis of colon  (without mention of hemorrhage)    GERD (gastroesophageal reflux disease) 08/09/2011   Hyperlipidemia 04/16/2007   10/24 reveal study end. Atorvastatin 48m   HYPERTENSION 04/12/2007   HYPOTHYROIDISM 04/12/2007   Ischemic cardiomyopathy    Morbid obesity ([facility] 08/09/2011   MYOCARDIAL INFARCTION, HX OF 04/12/2007   Obstructive sleep apnea 04/12/2007   02/2011 - AHI 96/h CPAP 13, Lg FF      Stroke (HCC)    Type II diabetes mellitus with peripheral circulatory disorder ([place] 04/16/2007   Dr. GCruzita Lederermanages. Uses fructosamine.     ULCERATIVE COLITIS, LEFT SIDED 11/23/2010   Ulcerative pancolitis without complication ([facility] 132/67/1245  Ulcerative Colitis. Mesalamine.    VITAMIN B12 DEFICIENCY 11/29/2010   Past Surgical History:  Procedure Laterality Date   CARDIAC CATHETERIZATION  10/2002   STENT. 2 stents Dr. BMaurene Capes  CARDIAC CATHETERIZATION N/A 10/28/2015   Procedure: Right/Left Heart Cath and Coronary Angiography;  Surgeon: MSherren Mocha MD;  [facility];  Service: Cardiovascular;  Laterality: N/A;   CATARACT EXTRACTION     2021 bilateral eyes    CORONARY STENT PLACEMENT  2008   LAD    RIGHT/LEFT HEART CATH AND CORONARY ANGIOGRAPHY N/A 06/01/2018   Procedure: RIGHT/LEFT HEART CATH AND CORONARY ANGIOGRAPHY;  Surgeon: CSherren Mocha MD;  [facility];  Service: Cardiovascular;  Laterality: N/A;   TONSILLECTOMY      FAMILY HISTORY Family History  Problem Relation Age of Onset   Heart attack Mother        mid 769s  Heart disease Father        H/O CAD, CABG, VALVE SURGERY   Hypertension Brother    Obesity Brother    Stomach cancer Maternal Aunt    Stomach cancer Paternal Grandmother        ? colon    Colon cancer Neg Hx     SOCIAL HISTORY Social History   Tobacco Use   Smoking status: Former    Packs/day: 1.00    Years: 5.00    Pack years: 5.00    Types: Cigarettes    Quit date: 04/13/1966    Years since quitting: 55.2   Smokeless tobacco: Never   Vaping Use   Vaping Use: Never used  Substance Use Topics   Alcohol use: No    Alcohol/week: 0.0 standard drinks    Comment: no more beer   Drug use: No         OPHTHALMIC EXAM:  Base Eye Exam     Visual Acuity (ETDRS)       Right Left   Dist [place] 20/25 20/20         Tonometry (Tonopen, 1:53 PM)  Right Left   Pressure 15 17         Pupils       Pupils Dark Light Shape React APD   Right PERRL 4 3 Round Brisk None   Left PERRL 4 3 Round Brisk None         Visual Fields (Counting fingers)       Left Right    Full Full         Extraocular Movement       Right Left    Full, Ortho Full, Ortho         Neuro/Psych     Oriented x3: Yes   Mood/Affect: Normal         Dilation     Both eyes: 1.0% Mydriacyl, 2.5% Phenylephrine @ 1:53 PM           Slit Lamp and Fundus Exam     External Exam       Right Left   External Normal Normal         Slit Lamp Exam       Right Left   Lids/Lashes Normal Normal   Conjunctiva/Sclera White and quiet White and quiet   Cornea Clear Clear   Anterior Chamber Deep and quiet Deep and quiet   Iris Round and reactive Round and reactive   Lens Posterior chamber intraocular lens, 1+ Posterior capsular opacification Posterior chamber intraocular lens, 2+ Posterior capsular opacification   Anterior Vitreous Normal Normal         Fundus Exam       Right Left   Posterior Vitreous Normal Normal   Disc Normal Normal   C/D Ratio 0.1 0.1   Macula Normal Normal   Vessels Retinopathy moderate NPDR Retinopathy moderate NPDR   Periphery Normal Normal            IMAGING AND PROCEDURES  Imaging and Procedures for 06/30/21  OCT, Retina - OU - Both Eyes       Right Eye Quality was good. Scan locations included subfoveal. Central Foveal Thickness: 298. Progression has been stable. Findings include normal foveal contour.   Left Eye Quality was good. Scan locations included subfoveal. Central  Foveal Thickness: 288. Progression has been stable. Findings include normal foveal contour.   Notes No active CSME OU, will observe             ASSESSMENT/PLAN:  Moderate nonproliferative diabetic retinopathy of both eyes (HCC) Moderate nonproliferative diabetic retinopathy OU remained stable  The nature of moderate nonproliferative diabetic retinopathy was discussed with the patient as well as the need for more frequent follow up to judge for progression. Good blood glucose, blood pressure, and serum lipid control was recommended as well as avoidance of smoking and maintenance of normal weight.  Close follow up with PCP encouraged.  Posterior capsular opacification, left Some visual axis impact by cloudy capsule.  Suggest follow-up with Dr. Rutherford Guys for evaluation possible YAG laser     ICD-10-CM   1. Moderate nonproliferative diabetic retinopathy of both eyes without macular edema associated with type 2 diabetes mellitus ([place])  L87.5643     2. Posterior capsular opacification, left  H26.492     3. Cystoid macular edema of right eye  H35.351 OCT, Retina - OU - Both Eyes      1.  Moderate NPDR OU relatively stable over time.  At 1 year follow-up today  2.  Mild PC opacity left eye probably encroaching upon acuity and may be  having some impact on acuity.  Recommend follow-up Dr. Rutherford Guys and consideration of YAG laser left eye  3.  Minor CME in the past not active today  Ophthalmic Meds Ordered this visit:  No orders of the defined types were placed in this encounter.      Return in about 9 months (around 03/30/2022) for DILATE OU, OCT, COLOR FP.  There are no Patient Instructions on file for this visit.   Explained the diagnoses, plan, and follow up with the patient and they expressed understanding.  Patient expressed understanding of the importance of proper follow up care.   Clent Demark Justyna Timoney M.D. Diseases & Surgery of the Retina and Vitreous Retina &  Diabetic [place] 06/30/21     Abbreviations: M myopia (nearsighted); A astigmatism; H hyperopia (farsighted); P presbyopia; Mrx spectacle prescription;  CTL contact lenses; OD right eye; OS left eye; OU both eyes  XT exotropia; ET esotropia; PEK punctate epithelial keratitis; PEE punctate epithelial erosions; DES dry eye syndrome; MGD meibomian gland dysfunction; ATs artificial tears; PFAT's preservative free artificial tears; [place] nuclear sclerotic cataract; PSC posterior subcapsular cataract; ERM epi-retinal membrane; PVD posterior vitreous detachment; RD retinal detachment; DM diabetes mellitus; DR diabetic retinopathy; NPDR non-proliferative diabetic retinopathy; PDR proliferative diabetic retinopathy; CSME clinically significant macular edema; DME diabetic macular edema; dbh dot blot hemorrhages; CWS cotton wool spot; POAG primary open angle glaucoma; C/D cup-to-disc ratio; HVF humphrey visual field; GVF goldmann visual field; OCT optical coherence tomography; IOP intraocular pressure; BRVO Branch retinal vein occlusion; CRVO central retinal vein occlusion; CRAO central retinal artery occlusion; BRAO branch retinal artery occlusion; RT retinal tear; SB scleral buckle; PPV pars plana vitrectomy; VH Vitreous hemorrhage; PRP panretinal laser photocoagulation; IVK intravitreal kenalog; VMT vitreomacular traction; MH Macular hole;  NVD neovascularization of the disc; NVE neovascularization elsewhere; AREDS age related eye disease study; ARMD age related macular degeneration; POAG primary open angle glaucoma; EBMD epithelial/anterior basement membrane dystrophy; ACIOL anterior chamber intraocular lens; IOL intraocular lens; PCIOL posterior chamber intraocular lens; Phaco/IOL phacoemulsification with intraocular lens placement; [place] photorefractive keratectomy; LASIK laser assisted in situ keratomileusis; HTN hypertension; DM diabetes mellitus; COPD chronic obstructive pulmonary disease

## 2021-07-08 ENCOUNTER — Ambulatory Visit (INDEPENDENT_AMBULATORY_CARE_PROVIDER_SITE_OTHER): Payer: Medicare Other | Admitting: *Deleted

## 2021-07-08 ENCOUNTER — Ambulatory Visit: Payer: Medicare Other | Admitting: Nurse Practitioner

## 2021-07-08 DIAGNOSIS — I5042 Chronic combined systolic (congestive) and diastolic (congestive) heart failure: Secondary | ICD-10-CM

## 2021-07-08 DIAGNOSIS — E1151 Type 2 diabetes mellitus with diabetic peripheral angiopathy without gangrene: Secondary | ICD-10-CM

## 2021-07-08 DIAGNOSIS — I10 Essential (primary) hypertension: Secondary | ICD-10-CM

## 2021-07-08 NOTE — Chronic Care Management (AMB) (Signed)
Chronic Care Management   CCM RN Visit Note  07/08/2021 Name: [name] MRN: [id number] DOB: 09/07/1946  Subjective: [name] is a 75 y.o. year old male who is a primary care patient of Yong Channel, Brayton Mars, MD. The care management team was consulted for assistance with disease management and care coordination needs.    Engaged with patient by telephone for follow up visit in response to provider referral for case management and/or care coordination services.   Consent to Services:  The patient was given information about Chronic Care Management services, agreed to services, and gave verbal consent prior to initiation of services.  Please see initial visit note for detailed documentation.   Patient agreed to services and verbal consent obtained.   Assessment: Review of patient past medical history, allergies, medications, health status, including review of consultants reports, laboratory and other test data, was performed as part of comprehensive evaluation and provision of chronic care management services.   SDOH (Social Determinants of Health) assessments and interventions performed:    CCM Care Plan  Allergies  Allergen Reactions   Clindamycin/Lincomycin     Developed C. difficile colitis 1 month after use    Outpatient Encounter Medications as of 07/08/2021  Medication Sig   APRISO 0.375 g 24 hr capsule Take 4 capsules (1.5 g total) by mouth daily.   atorvastatin (LIPITOR) 20 MG tablet TAKE ONE TABLET (20MG TOTAL) BY MOUTH DAILY   carvedilol (COREG) 25 MG tablet TAKE ONE TABLET BY MOUTH TWICE A DAY   clopidogrel (PLAVIX) 75 MG tablet TAKE ONE TABLET (75MG TOTAL) BY MOUTH DAILY   Coenzyme Q10 (COQ10 PO) Take 1 capsule by mouth daily with supper.   dicyclomine (BENTYL) 10 MG capsule TAKE ONE CAPSULE (10MG TOTAL) BY MOUTH TWO TIMES DAILY AS NEEDED FOR DIARRHEA OR ABDOMINAL CRAMPING   empagliflozin (JARDIANCE) 10 MG TABS tablet TAKE ONE (1) TABLET BY MOUTH EVERY DAY    fish oil-omega-3 fatty acids 1000 MG capsule Take 1 g by mouth daily with supper.   furosemide (LASIX) 40 MG tablet TAKE ONE TABLET (40MG TOTAL) BY MOUTH TWO TIMES DAILY   glucose blood test strip Use as instructed to test 4 time daily   isosorbide mononitrate (IMDUR) 60 MG 24 hr tablet TAKE ONE (1) TABLET BY MOUTH EVERY DAY   metFORMIN (GLUCOPHAGE) 500 MG tablet Take 1 tablet (500 mg total) by mouth 2 (two) times daily with a meal.   PRADAXA 150 MG CAPS capsule Take 150 mg by mouth every 12 (twelve) hours.   saccharomyces boulardii (FLORASTOR) 250 MG capsule Take 1 capsule (250 mg total) by mouth 2 (two) times daily.   sacubitril-valsartan (ENTRESTO) 97-103 MG TAKE ONE TABLET BY MOUTH TWICE A DAY   sitaGLIPtin (JANUVIA) 100 MG tablet TAKE ONE (1) TABLET BY MOUTH EVERY DAY   spironolactone (ALDACTONE) 25 MG tablet TAKE ONE (1) TABLET BY MOUTH EVERY DAY   SYNTHROID 50 MCG tablet TAKE ONE TABLET BY MOUTH DAILY BEFORE BREAKFAST   triamcinolone (KENALOG) 0.1 % Apply 1 application topically 2 (two) times daily. For 7-10 days maximum   vitamin B-12 (CYANOCOBALAMIN) 500 MCG tablet Take 1,000 mcg by mouth daily.   No facility-administered encounter medications on file as of 07/08/2021.    Patient Active Problem List   Diagnosis Date Noted   Posterior capsular opacification, left 06/30/2021   Lactose intolerance 01/06/2021   Fecal incontinence 01/06/2021   Acute on chronic respiratory failure with hypoxia ([place]) 12/27/2020   Atrial fibrillation  with RVR ([place])    Acute encephalopathy    Moderate nonproliferative diabetic retinopathy of both eyes ([place]) 01/16/2020   Atherosclerotic heart disease of native coronary artery with other forms of angina pectoris ([place]) 04/26/2019   C. difficile colitis 04/17/2019   Dehydration 04/15/2019   Diarrhea 04/15/2019   ARF (acute renal failure) ([place]) 04/15/2019   Chronic combined systolic and diastolic CHF (congestive heart failure) ([place]) 04/15/2019   Hypokalemia  04/15/2019   AKI (acute kidney injury) ([place])    Acute on chronic combined systolic and diastolic heart failure ([place]) 02/07/2018   Former smoker 08/11/2016   MCI (mild cognitive impairment) 01/19/2016   History of transient ischemic attack (TIA) 10/24/2015   Primary osteoarthritis of left knee 02/09/2015   CHF (congestive heart failure), NYHA class II ([place]) 05/09/2014   Allergic rhinitis 06/25/2012   Ulcerative pancolitis without complication ([place]) 56/81/2751   GERD (gastroesophageal reflux disease) 08/09/2011   Morbid obesity ([place]) 08/09/2011   Atrial fibrillation ([place]) 12/08/2010   VITAMIN B12 DEFICIENCY 11/29/2010   Type II diabetes mellitus with peripheral circulatory disorder ([place]) 04/16/2007   Hyperlipidemia 04/16/2007   Hypothyroidism 04/12/2007   Essential hypertension 04/12/2007   CAD (coronary artery disease), native coronary artery 04/12/2007   Obstructive sleep apnea 04/12/2007    Conditions to be addressed/monitored:CHF, HTN, and DMII  Care Plan : Heart Failure (Adult)  Updates made by Leona Singleton, RN since 07/08/2021 12:00 AM     Problem: Symptom Exacerbation (Heart Failure)   Priority: High     Long-Range Goal: Patient will report no hospital readmissions related to heart failure within the next 90 days   Start Date: 02/02/2021  Expected End Date: 10/09/2021  This Visit's Progress: On track  Recent Progress: On track  Priority: High  Note:   Objective:  Last practice recorded BP readings:  BP Readings from Last 3 Encounters:  03/03/21 101/66  02/16/21 112/80  01/27/21 128/62  Current Barriers:  Knowledge deficit related to basic heart failure and hypertension pathophysiology and self care management as evidenced by recent hospitalization related to hypertension and heart failure exacerbation in March.  Patient states he has been doing much better.  States he monitors weights daily.  Weight this morning was 232 pounds; BP 112/78.  Denies any shortness of  breath, but does report some lower extremity edema in left foot that is chronic in nature per patient.  Reports compliance with medications and states he has been trying to decrease the sodium in his diet.   Case Manager Clinical Goal(s):  Patient will verbalize understanding of plan for hypertension and heart failure management Patient will report weighing self daily Patient will not experience hospital admission. Hospital Admissions in last 6 months = 1 Interventions:  Collaboration with Marin Olp, MD regarding development and update of comprehensive plan of care as evidenced by provider attestation and co-signature Inter-disciplinary care team collaboration (see longitudinal plan of care) Evaluation of current treatment plan related to hypertension self management and patient's adherence to plan as established by provider. Provided education to patient re: stroke prevention, s/s of heart attack and stroke, DASH diet, complications of uncontrolled blood pressure Reviewed medications with patient and discussed importance of compliance Discussed plans with patient for ongoing care management follow up and provided patient with direct contact information for care management team Advised patient, providing education and rationale, to monitor blood pressure daily and record, calling PCP for findings outside established parameters.  Basic overview and discussion of pathophysiology of Heart Failure  reviewed  Provided verbal education on low sodium diet and encouraged to follow a low sodium diet/DASH diet Reviewed Heart Failure Action Plan in depth  Advised patient to weigh each morning after emptying bladder Discussed importance of daily weight and advised patient to weigh and record daily Reviewed role of diuretics in prevention of fluid overload and management of heart failure Barriers to lifestyle changes reviewed and addressed, barriers to treatment reviewed and addressed Cognitive  screening completed and reviewed and healthy lifestyle promoted Heart failure rescue (action) plan reviewed Self-awareness of signs/symptoms of worsening disease encouraged Encouraged to attend all scheduled provider appointments No significant changes/recommendations Patient Goals/Self-Care Activities: Call office if I gain more than 2 pounds in one day or 5 pounds in one week Keep legs up while sitting Watch for swelling in feet, ankles and legs every day Weigh myself daily and write in log for provider review Take blood pressure daily and write in log for provider review Notify provider for sustained blood pressure elevations or hypotension Follow Up Plan: The care management team will reach out to the patient again over the next 45 business days.      Care Plan : Diabetes Type 2 (Adult)  Updates made by Leona Singleton, RN since 07/08/2021 12:00 AM     Problem: Glycemic Management (Diabetes, Type 2)   Priority: Medium     Long-Range Goal: Patient will report maintaining Hgb A1C of below 6.5 within the next 180 days   Start Date: 02/02/2021  Expected End Date: 08/09/2021  This Visit's Progress: On track  Recent Progress: On track  Priority: Medium  Note:   Objective:  Lab Results  Component Value Date   HGBA1C 5.9 (H) 12/27/2020  Current Barriers:  Knowledge Deficits related to basic Diabetes pathophysiology and self care/management; Monitors blood sugars about twice a day.  Fasting blood sugar this morning was 114 with recent ranges of 100-130's.  Denies any recent episodes of hypo or hyperglycemia. Case Manager Clinical Goal(s):  patient will demonstrate improved adherence to prescribed treatment plan for diabetes self care/management as evidenced by: daily monitoring and recording of CBG,  adherence to ADA/ carb modified diet, adherence to prescribed medication regimen, contacting provider for new or worsened symptoms or questions Interventions:  Collaboration with  Marin Olp, MD regarding development and update of comprehensive plan of care as evidenced by provider attestation and co-signature Inter-disciplinary care team collaboration (see longitudinal plan of care) Provided education to patient about basic DM disease process Reviewed medications with patient and discussed importance of medication adherence Discussed plans with patient for ongoing care management follow up and provided patient with direct contact information for care management team Advised patient, providing education and rationale, to check cbg at least daily and record, calling Endocrinologist for findings outside established parameters.   Barriers to adherence to treatment plan identified Blood glucose monitoring encouraged, blood glucose readings reviewed, and use of blood glucose monitoring log promoted Individualized medical nutrition therapy provided, discussed and encouraged carbohydrate modified diabetic low sodium diet, discussed healthy food and drink options Mutual A1C goal set or reviewed; congratulated on current Hgb A1C and discussed ways to maintain Self-awareness of signs/symptoms of hypo or hyperglycemia encouraged Encouraged to attends all scheduled provider appointments Congratulated on obtaining eye exam No significant changes/recommendations Patient Goals/Self-Care Activities: Check blood sugar at least once or twice daily Check blood sugar if I feel it is too high or too low Enter blood sugar readings and medication into daily log Take the  blood sugar log to all doctor visits  Follow Up Plan: The care management team will reach out to the patient again over the next 45 business days.        Plan:The care management team will reach out to the patient again over the next 45 business days.  Hubert Azure RN, MSN RN Care Management Coordinator  [place] [phone] [email or web address]

## 2021-07-08 NOTE — Patient Instructions (Signed)
Visit Information  PATIENT GOALS:  Goals Addressed             This Visit's Progress    (RNCM) Monitor and Manage My Blood Sugar-Diabetes Type 2   On track    Timeframe:  Long-Range Goal Priority:  Medium Start Date:   02/02/21                          Expected End Date: 10/09/21                      Follow Up Date 11/102/22    Check blood sugar at least once or twice daily Check blood sugar if I feel it is too high or too low Enter blood sugar readings and medication into daily log Take the blood sugar log to all doctor visits     Why is this important?   Checking your blood sugar at home helps to keep it from getting very high or very low.  Writing the results in a diary or log helps the doctor know how to care for you.  Your blood sugar log should have the time, date and the results.  Also, write down the amount of insulin or other medicine that you take.  Other information, like what you ate, exercise done and how you were feeling, will also be helpful.     Notes:      (RNCM) Track and Manage Fluids and Swelling-Heart Failure   On track    Timeframe:  Long-Range Goal Priority:  Medium Start Date:  02/02/21                           Expected End Date:   10/09/21                    Follow Up Date 08/19/21    Call office if I gain more than 2 pounds in one day or 5 pounds in one week Keep legs up while sitting Watch for swelling in feet, ankles and legs every day Weigh myself daily and write in log for provider review Take blood pressure daily and write in log for provider review Notify provider for sustained blood pressure elevations or hypotension   Why is this important?   It is important to check your weight daily and watch how much salt and liquids you have.  It will help you to manage your heart failure.    Notes:         Patient verbalizes understanding of instructions provided today and agrees to view in [place].   The care management team will reach  out to the patient again over the next 45 business days.   Hubert Azure RN, MSN RN Care Management Coordinator  [facility] [phone] [email or web address]

## 2021-07-09 ENCOUNTER — Ambulatory Visit: Payer: Medicare Other | Admitting: Gastroenterology

## 2021-07-09 DIAGNOSIS — I5042 Chronic combined systolic (congestive) and diastolic (congestive) heart failure: Secondary | ICD-10-CM

## 2021-07-09 DIAGNOSIS — E1151 Type 2 diabetes mellitus with diabetic peripheral angiopathy without gangrene: Secondary | ICD-10-CM

## 2021-07-09 DIAGNOSIS — I10 Essential (primary) hypertension: Secondary | ICD-10-CM

## 2021-07-14 ENCOUNTER — Other Ambulatory Visit: Payer: Self-pay

## 2021-07-14 ENCOUNTER — Encounter: Payer: Self-pay | Admitting: Internal Medicine

## 2021-07-14 ENCOUNTER — Ambulatory Visit (INDEPENDENT_AMBULATORY_CARE_PROVIDER_SITE_OTHER): Payer: Medicare Other | Admitting: Internal Medicine

## 2021-07-14 VITALS — BP 108/76 | HR 86 | Ht 68.0 in | Wt 237.2 lb

## 2021-07-14 DIAGNOSIS — I2510 Atherosclerotic heart disease of native coronary artery without angina pectoris: Secondary | ICD-10-CM | POA: Diagnosis not present

## 2021-07-14 DIAGNOSIS — E785 Hyperlipidemia, unspecified: Secondary | ICD-10-CM

## 2021-07-14 DIAGNOSIS — E1151 Type 2 diabetes mellitus with diabetic peripheral angiopathy without gangrene: Secondary | ICD-10-CM

## 2021-07-14 DIAGNOSIS — Z23 Encounter for immunization: Secondary | ICD-10-CM

## 2021-07-14 DIAGNOSIS — E039 Hypothyroidism, unspecified: Secondary | ICD-10-CM | POA: Diagnosis not present

## 2021-07-14 LAB — POCT GLYCOSYLATED HEMOGLOBIN (HGB A1C): Hemoglobin A1C: 6.9 % — AB (ref 4.0–5.6)

## 2021-07-14 NOTE — Patient Instructions (Addendum)
Please continue:  - Metformin 500 mg 2x a day - Januvia 100 mg before breakfast - Jardiance 10 mg before breakfast   Please return in 4-6 months with your sugar log.

## 2021-07-14 NOTE — Progress Notes (Signed)
Patient ID: [name], male   DOB: 23-Jul-1946, 75 y.o.   MRN: [id number]  This visit occurred during the SARS-CoV-2 public health emergency.  Safety protocols were in place, including screening questions prior to the visit, additional usage of staff PPE, and extensive cleaning of exam room while observing appropriate contact time as indicated for disinfecting solutions.   HPI: [name] is a 75 y.o.-year-old male, returning for DM2, dx 2004, non-insulin-dependent, uncontrolled, with complications (CAD, s/p MI). Last visit 6 months ago. He has [facility] as supplemental and [employer] for meds in 10/2015.  Interim history: Before last visit, he was admitted 3/20-24/2022 for hypoxemic respiratory failure in the setting of CHF after not taking his Lasix for a few weeks due to increased urination.  At that time, he was unresponsive, had A. fib with RVR at that time and a glucose level at 260.  He had to be intubated.  He recovered well afterwards. He ate more fruit over the summer.  He also had several birthday parties recently and occasionally sugars are higher. No increased urination, blurry vision, nausea, chest pain.  Reviewed HbA1c levels: Lab Results  Component Value Date   HGBA1C 5.9 (H) 12/27/2020   HGBA1C 6.3 (A) 07/08/2020   HGBA1C 6.8 (H) 02/24/2020  02/27/2019: HbA1c calculated from fructosamine is better: 6.45%. 10/24/2018: HbA1c calculated from fructosamine is slightly higher than the one from 02/2018, at 6.6%. 02/13/2018: The HbA1c calculated from the fructosamine is excellent, at 6.3%! 10/16/2017: HbA1c calculated from fructosamine is: 7.3% (higher). 06/16/2017: HbA1c calculated from the fructosamine is stable, at 6.8%.  12/08/2016: HbA1c calculated from the fructosamine is better, at 6.86%. 08/10/2016: HbA1c calculated from  fructosamine is 7.88% 05/10/2016: HbA1c calculated from fructosamine is 7.6% 01/06/2016: HbA1c calculated from fructosamine is 6.6% 09/06/2016: HbA1c  calculated from fructosamine is 7.0% 06/01/2015: HbA1c calculated from fructosamine is 7.0% 02/23/2015: HbA1c calculated from fructosamine is 6.88% 10/15/2014: HbA1c calculated from fructosamine is 7.17% He started to change his eating habits since 03/2013.   He is on: - Metformin 1000 mg 2x a day >> 500 mg 2x a day  - Januvia 100 mg daily  - Jardiance 10 mg in a.m. every other day due to increased urination >> now every day starting 02/2019 He does not miss doses. He was on glipizide 10 mg twice a day in the past but this was stopped during hospitalization in 12/2020.  Pt checks his sugars 4 times a day per his excellent log: - am:  92-118 >> 101-130 >> 119-134, 137 >> 112-136 >> 110-133, 137 - 2h after breakfast: 130-164 >> 144-164 >> 146-164 >> 138-160, 170 - before lunch 144 >> 110-120s >> n/c   - before dinner:  97-117 >> 97-122 >> 110-123 >> 112-127 >> 89-111 - 2h after dinner: 133-147 >> 141-159 >> 107, 146-159 >> 140-168, 173 He has hypoglycemia awareness in the 60s. Highest: 287 >> .Marland KitchenMarland Kitchen 151 >> 164 >> 164 >> 164 >> 173  His wife is a Radiographer, therapeutic.  He saw [name] (nutritionist).  -+ Mild CKD, last BUN/creatinine:  Lab Results  Component Value Date   BUN 24 (H) 03/03/2021   CREATININE 1.30 03/03/2021  On Entresto.  -+ HL:  last set of lipids: Lab Results  Component Value Date   CHOL 79 03/03/2021   HDL 55.90 03/03/2021   LDLCALC 6 03/03/2021   LDLDIRECT 18.0 06/13/2017   TRIG 86.0 03/03/2021   CHOLHDL 1 03/03/2021  He was in the Reveal Study x  4 years >> stopped. On Lipitor 20 and omega-3 fatty acids.  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 Zadie Rhine.  He has a history of cataract surgery Starts recording so is going to be in the exact I will likely endocarditis cannot do things I can read I cannot leave endocarditis Sick I can is nothing that I can elicit ON my but  is not metoprolol we tried to some so board for the I am but something no I have not believe it or not I am not topically blood.  Is able to ride for a long time 3 hours good but yesterday once a month ago with his sister when they came in 8 LADA basis but now they want to take me its allergy he has a conference with him times - no numbness and tingling in his feet.  He is compliant with CPAP for OSA, also has A. Fib-on Plavix and pradaxa, IBD -ulcerative pancolitis, GERD, also, obesity.  In 2019 she was also diagnosed with CHF -EF of 25 to 30%.  His Lasix was increased to twice a day(takes the second dose as needed). Cardiac cath >> no significant obstruction.  He was started on Entresto.  He has a history of very low vitamin B12, improved: Lab Results  Component Value Date   VITAMINB12 1,256 (H) 09/02/2020   VITAMINB12 103 (L) 02/24/2020   VITAMINB12 267 05/01/2012   VITAMINB12 201 (L) 11/23/2010   She was started on B12 injections q. weekly for 4 weeks then 1000 mcg p.o. daily by PCP  Hypothyroidism:  Pt is on levothyroxine 50 mcg daily, taken: - in am - fasting - at least 30 min from b'fast - no Ca, Fe, MVI, PPIs - not on Biotin  Latest TSH was reviewed and this was normal: Lab Results  Component Value Date   TSH 2.036 12/27/2020   Pt denies: - feeling nodules in neck - hoarseness - dysphagia - choking - SOB with lying down  ROS: + See HPI  I reviewed pt's medications, allergies, PMH, social hx, family hx, and changes were documented in the history of present illness. Otherwise, unchanged from my initial visit note.  Past Medical History:  Diagnosis Date   B12 deficiency    per patient previously taking shots   C. difficile colitis 04/17/2019   C. difficile diarrhea    CAD (coronary artery disease), native coronary artery 04/12/2007   Chronic atrial fibrillation ([place])    initial diagnoses 2012   Chronic combined systolic and diastolic CHF (congestive heart  failure) ([place]) 04/15/2019   CKD (chronic kidney disease), stage III ([place]) 04/15/2019   CORONARY ARTERY DISEASE 04/12/2007   2 stents last in 2006.    Cystoid macular edema of right eye 01/16/2020   Cystoid macular edema of right eye 01/16/2020   DIABETES MELLITUS, TYPE II 04/16/2007   Diverticulosis of colon (without mention of hemorrhage)    GERD (gastroesophageal reflux disease) 08/09/2011   Hyperlipidemia 04/16/2007   10/24 reveal study end. Atorvastatin 53m   HYPERTENSION 04/12/2007   HYPOTHYROIDISM 04/12/2007   Ischemic cardiomyopathy    Morbid obesity ([facility] 08/09/2011   MYOCARDIAL INFARCTION, HX OF 04/12/2007   Obstructive sleep apnea 04/12/2007   02/2011 - AHI 96/h CPAP 13, Lg FF      Stroke ([facility])    Type II diabetes mellitus with peripheral circulatory disorder ([facility] 04/16/2007   Dr. GCruzita Lederermanages.  Uses fructosamine.     ULCERATIVE COLITIS, LEFT SIDED 11/23/2010   Ulcerative pancolitis without complication ([place]) 00/86/7619   Ulcerative Colitis. Mesalamine.    VITAMIN B12 DEFICIENCY 11/29/2010   Past Surgical History:  Procedure Laterality Date   CARDIAC CATHETERIZATION  10/2002   STENT. 2 stents Dr. Maurene Capes   CARDIAC CATHETERIZATION N/A 10/28/2015   Procedure: Right/Left Heart Cath and Coronary Angiography;  Surgeon: Sherren Mocha, MD;  Location: [facility];  Service: Cardiovascular;  Laterality: N/A;   CATARACT EXTRACTION     2021 bilateral eyes    CORONARY STENT PLACEMENT  2008   LAD    RIGHT/LEFT HEART CATH AND CORONARY ANGIOGRAPHY N/A 06/01/2018   Procedure: RIGHT/LEFT HEART CATH AND CORONARY ANGIOGRAPHY;  Surgeon: Sherren Mocha, MD;  Location: [facility];  Service: Cardiovascular;  Laterality: N/A;   TONSILLECTOMY     Social History   Socioeconomic History   Marital status: Married    Spouse name: Not on file   Number of children: 1   Years of education: Not on file   Highest education level: Not on file  Occupational History   Occupation: RETIRED    Employer:  RETIRED  Tobacco Use   Smoking status: Former    Packs/day: 1.00    Years: 5.00    Pack years: 5.00    Types: Cigarettes    Quit date: 04/13/1966    Years since quitting: 55.2   Smokeless tobacco: Never  Vaping Use   Vaping Use: Never used  Substance and Sexual Activity   Alcohol use: No    Alcohol/week: 0.0 standard drinks    Comment: no more beer   Drug use: No   Sexual activity: Not on file  Other Topics Concern   Not on file  Social History Narrative   Cardiorehab 3 days a week. 45 minutes to an hour-stationary bike.    GRANDDAUGHTER (MS. 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       Married for 37 years in 2015, married previously for 14 years. Daughter with first wife and 3 grandkids.    Lives alone with wife. Get to see grandkids a lot. New grandchild in middle of 26      Hobbies-yardwork, previously liked to hunt and fish, does some target shooting   Social Determinants of Health   Financial Resource Strain: Low Risk    Difficulty of Paying Living Expenses: Not hard at all  Food Insecurity: No Food Insecurity   Worried About Charity fundraiser in the Last Year: Never true   Arboriculturist in the Last Year: Never true  Transportation Needs: No Transportation Needs   Lack of Transportation (Medical): No   Lack of Transportation (Non-Medical): No  Physical Activity: Inactive   Days of Exercise per Week: 0 days   Minutes of Exercise per Session: 0 min  Stress: No Stress Concern Present   Feeling of Stress : Not at all  Social Connections: Moderately Integrated   Frequency of Communication with Friends and Family: More than three times a week   Frequency of Social Gatherings with Friends and Family: More than three times a week   Attends Religious Services: More than 4 times per year   Active Member of [employer] or Organizations: No   Attends Archivist  Meetings: Never   Marital Status:  Married  Human resources officer Violence: Not At Risk   Fear of Current or Ex-Partner: No   Emotionally Abused: No   Physically Abused: No   Sexually Abused: No   Current Outpatient Medications on File Prior to Visit  Medication Sig Dispense Refill   APRISO 0.375 g 24 hr capsule Take 4 capsules (1.5 g total) by mouth daily. 120 capsule 5   atorvastatin (LIPITOR) 20 MG tablet TAKE ONE TABLET (20MG TOTAL) BY MOUTH DAILY 30 tablet 5   carvedilol (COREG) 25 MG tablet TAKE ONE TABLET BY MOUTH TWICE A DAY 180 tablet 3   clopidogrel (PLAVIX) 75 MG tablet TAKE ONE TABLET (75MG TOTAL) BY MOUTH DAILY 90 tablet 3   Coenzyme Q10 (COQ10 PO) Take 1 capsule by mouth daily with supper.     dicyclomine (BENTYL) 10 MG capsule TAKE ONE CAPSULE (10MG TOTAL) BY MOUTH TWO TIMES DAILY AS NEEDED FOR DIARRHEA OR ABDOMINAL CRAMPING 90 capsule 3   empagliflozin (JARDIANCE) 10 MG TABS tablet TAKE ONE (1) TABLET BY MOUTH EVERY DAY 90 tablet 3   fish oil-omega-3 fatty acids 1000 MG capsule Take 1 g by mouth daily with supper.     furosemide (LASIX) 40 MG tablet TAKE ONE TABLET (40MG TOTAL) BY MOUTH TWO TIMES DAILY 60 tablet 0   glucose blood test strip Use as instructed to test 4 time daily 400 each 3   isosorbide mononitrate (IMDUR) 60 MG 24 hr tablet TAKE ONE (1) TABLET BY MOUTH EVERY DAY 90 tablet 3   metFORMIN (GLUCOPHAGE) 500 MG tablet Take 1 tablet (500 mg total) by mouth 2 (two) times daily with a meal. 180 tablet 3   PRADAXA 150 MG CAPS capsule Take 150 mg by mouth every 12 (twelve) hours.     saccharomyces boulardii (FLORASTOR) 250 MG capsule Take 1 capsule (250 mg total) by mouth 2 (two) times daily.     sacubitril-valsartan (ENTRESTO) 97-103 MG TAKE ONE TABLET BY MOUTH TWICE A DAY 60 tablet 1   sitaGLIPtin (JANUVIA) 100 MG tablet TAKE ONE (1) TABLET BY MOUTH EVERY DAY 90 tablet 3   spironolactone (ALDACTONE) 25 MG tablet TAKE ONE (1) TABLET BY MOUTH EVERY DAY 90 tablet 3    SYNTHROID 50 MCG tablet TAKE ONE TABLET BY MOUTH DAILY BEFORE BREAKFAST 90 tablet 3   triamcinolone (KENALOG) 0.1 % Apply 1 application topically 2 (two) times daily. For 7-10 days maximum 80 g 0   vitamin B-12 (CYANOCOBALAMIN) 500 MCG tablet Take 1,000 mcg by mouth daily.     No current facility-administered medications on file prior to visit.   Allergies  Allergen Reactions   Clindamycin/Lincomycin     Developed C. difficile colitis 1 month after use   Family History  Problem Relation Age of Onset   Heart attack Mother        mid 76s   Heart disease Father        H/O CAD, CABG, VALVE SURGERY   Hypertension Brother    Obesity Brother    Stomach cancer Maternal Aunt    Stomach cancer Paternal Grandmother        ? colon    Colon cancer Neg Hx     PE: BP 108/76 (BP Location: Right Arm, Patient Position: Sitting, Cuff Size: Normal)   Pulse 86   Ht 5' 8"  (1.727 m)   Wt 237 lb 3.2 oz (107.6 kg)   SpO2 98%   BMI 36.07 kg/m  Body mass index is 36.07 kg/m.  Wt Readings  from Last 3 Encounters:  07/14/21 237 lb 3.2 oz (107.6 kg)  03/03/21 229 lb (103.9 kg)  02/16/21 230 lb 9.6 oz (104.6 kg)   Constitutional: overweight, in NAD Eyes: PERRLA, EOMI, no exophthalmos ENT: moist mucous membranes, no thyromegaly, no cervical lymphadenopathy Cardiovascular: RRR, No MRG, + B LE edema L>R Respiratory: CTA B Gastrointestinal: abdomen soft, NT, ND, BS+ Musculoskeletal: no deformities, strength intact in all 4 Skin: moist, warm, no rashes Neurological: no tremor with outstretched hands, DTR normal in all 4  ASSESSMENT: 1. DM2, non-insulin-dependent, previously uncontrolled, with complications - CAD, s/p MI 10/2006 - s/p stent - sees [name]  2. Obesity class 3 BMI Classification: < 18.5 underweight  18.5-24.9 normal weight  25.0-29.9 overweight  30.0-34.9 class I obesity  35.0-39.9 class II obesity  ? 40.0 class III obesity   3. HL  4.  Hypothyroidism  PLAN:  1.  Patient with longstanding, uncontrolled, type 2 diabetes, on oral antidiabetic regimen with metformin, DPP 4 inhibitor, and SGLT2 inhibitor.  He decreased the dose of metformin after losing weight following his C. difficile infection in 2020.  We did not increase it back afterwards.  He was also taken off sulfonylurea at discharge.  We did not restart this, either. -Since last visit, she feels that sugars are slightly worse as he had multiple celebrations and had more dietary indiscretions, but the vast majority of them are still at goal.  Therefore, I did not suggest a change in regimen at this visit.  He tolerates Jardiance well, without any side effects. - I advised him to Patient Instructions  Please continue:  - Metformin 500 mg 2x a day - Januvia 100 mg before breakfast - Jardiance 10 mg before breakfast   Please return in 6 months with your sugar log.   - we checked his HbA1c: 6.9% (higher) - advised to check sugars at different times of the day - 1x a day, rotating check times - advised for yearly eye exams >> he is UTD - return to clinic in 6 months  2. HL -Reviewed latest lipid panel from 02/2021: LDL very low, the rest of the fractions at goal: Lab Results  Component Value Date   CHOL 79 03/03/2021   HDL 55.90 03/03/2021   LDLCALC 6 03/03/2021   LDLDIRECT 18.0 06/13/2017   TRIG 86.0 03/03/2021   CHOLHDL 1 03/03/2021  -He continues on Lipitor 20 and omega-3 fatty acids without side effects  3.  Hypothyroidism - latest thyroid labs reviewed with pt. >> normal: Lab Results  Component Value Date   TSH 2.036 12/27/2020  - he continues on LT4 50 mcg daily - pt feels good on this dose. - we discussed about taking the thyroid hormone every day, with water, >30 minutes before breakfast, separated by >4 hours from acid reflux medications, calcium, iron, multivitamins. Pt. is taking it correctly.  Philemon Kingdom, MD PhD [facility] Endocrinology

## 2021-07-26 ENCOUNTER — Institutional Professional Consult (permissible substitution): Payer: Medicare Other | Admitting: Neurology

## 2021-08-09 ENCOUNTER — Other Ambulatory Visit (HOSPITAL_COMMUNITY): Payer: Self-pay | Admitting: Internal Medicine

## 2021-08-10 ENCOUNTER — Encounter: Payer: Self-pay | Admitting: Family Medicine

## 2021-08-10 ENCOUNTER — Ambulatory Visit (INDEPENDENT_AMBULATORY_CARE_PROVIDER_SITE_OTHER): Payer: Medicare Other | Admitting: Family Medicine

## 2021-08-10 ENCOUNTER — Other Ambulatory Visit: Payer: Self-pay

## 2021-08-10 VITALS — BP 100/62 | HR 95 | Temp 98.1°F | Ht 68.0 in | Wt 232.5 lb

## 2021-08-10 DIAGNOSIS — I2510 Atherosclerotic heart disease of native coronary artery without angina pectoris: Secondary | ICD-10-CM

## 2021-08-10 DIAGNOSIS — M25512 Pain in left shoulder: Secondary | ICD-10-CM

## 2021-08-10 DIAGNOSIS — N472 Paraphimosis: Secondary | ICD-10-CM

## 2021-08-10 MED ORDER — CLOTRIMAZOLE 1 % EX OINT
TOPICAL_OINTMENT | CUTANEOUS | 0 refills | Status: DC
Start: 2021-08-10 — End: 2023-11-13

## 2021-08-10 NOTE — Progress Notes (Addendum)
Phone [phone] In person visit   Subjective:   [name] is a 75 y.o. year old very pleasant male patient who presents for/with See problem oriented charting Chief Complaint  Patient presents with   Shoulder Pain    Pt is having left shoulder pain x several weeks. Hurts with certain movements. He is using Tylenol with some relief.   Rash    He is c/o perineal rash all over, area is red and irritated.   This visit occurred during the SARS-CoV-2 public health emergency.  Safety protocols were in place, including screening questions prior to the visit, additional usage of staff PPE, and extensive cleaning of exam room while observing appropriate contact time as indicated for disinfecting solutions.   Past Medical History-  Patient Active Problem List   Diagnosis Date Noted   History of transient ischemic attack (TIA) 10/24/2015    Priority: High   CHF (congestive heart failure), NYHA class II ([place]) 05/09/2014    Priority: High   Ulcerative pancolitis without complication ([place]) 42/87/6811    Priority: High   Atrial fibrillation ([place]) 12/08/2010    Priority: High   Type II diabetes mellitus with peripheral circulatory disorder ([place]) 04/16/2007    Priority: High   CAD (coronary artery disease), native coronary artery 04/12/2007    Priority: High   Morbid obesity ([place]) 08/09/2011    Priority: Medium    Hyperlipidemia 04/16/2007    Priority: Medium    Hypothyroidism 04/12/2007    Priority: Medium    Essential hypertension 04/12/2007    Priority: Medium    Obstructive sleep apnea 04/12/2007    Priority: Medium    Former smoker 08/11/2016    Priority: Low   MCI (mild cognitive impairment) 01/19/2016    Priority: Low   Primary osteoarthritis of left knee 02/09/2015    Priority: Low   Allergic rhinitis 06/25/2012    Priority: Low   GERD (gastroesophageal reflux disease) 08/09/2011    Priority: Low   VITAMIN B12 DEFICIENCY 11/29/2010    Priority: Low   Posterior  capsular opacification, left 06/30/2021   Lactose intolerance 01/06/2021   Fecal incontinence 01/06/2021   Acute on chronic respiratory failure with hypoxia ([place]) 12/27/2020   Atrial fibrillation with RVR (H[facility])    Acute encephalopathy    Moderate nonproliferative diabetic retinopathy of both eyes ([place]) 01/16/2020   Atherosclerotic heart disease of native coronary artery with other forms of angina pectoris ([place]) 04/26/2019   C. difficile colitis 04/17/2019   Dehydration 04/15/2019   Diarrhea 04/15/2019   ARF (acute renal failure) ([place]) 04/15/2019   Chronic combined systolic and diastolic CHF (congestive heart failure) ([place]) 04/15/2019   Hypokalemia 04/15/2019   AKI (acute kidney injury) ([place])    Acute on chronic combined systolic and diastolic heart failure ([place]) 02/07/2018    Medications- reviewed and updated Current Outpatient Medications  Medication Sig Dispense Refill   acetaminophen (TYLENOL) 500 MG tablet Take 1,000 mg by mouth every 6 (six) hours as needed.     APRISO 0.375 g 24 hr capsule Take 4 capsules (1.5 g total) by mouth daily. 120 capsule 5   atorvastatin (LIPITOR) 20 MG tablet TAKE ONE TABLET (20MG TOTAL) BY MOUTH DAILY 30 tablet 5   carvedilol (COREG) 25 MG tablet TAKE ONE TABLET BY MOUTH TWICE A DAY 180 tablet 3   clopidogrel (PLAVIX) 75 MG tablet TAKE ONE TABLET (75MG TOTAL) BY MOUTH DAILY 90 tablet 3   Clotrimazole 1 % OINT Apply to penis twice daily  after saline bath/rinse and pat dry 56.7 g 0   Coenzyme Q10 (COQ10 PO) Take 1 capsule by mouth daily with supper.     dicyclomine (BENTYL) 10 MG capsule TAKE ONE CAPSULE (10MG TOTAL) BY MOUTH TWO TIMES DAILY AS NEEDED FOR DIARRHEA OR ABDOMINAL CRAMPING 90 capsule 3   empagliflozin (JARDIANCE) 10 MG TABS tablet TAKE ONE (1) TABLET BY MOUTH EVERY DAY 90 tablet 3   fish oil-omega-3 fatty acids 1000 MG capsule Take 1 g by mouth daily with supper.     furosemide (LASIX) 40 MG tablet Take 1 tablet (40 mg total) by mouth 2  (two) times daily. 180 tablet 3   glucose blood test strip Use as instructed to test 4 time daily 400 each 3   isosorbide mononitrate (IMDUR) 60 MG 24 hr tablet TAKE ONE (1) TABLET BY MOUTH EVERY DAY 90 tablet 3   metFORMIN (GLUCOPHAGE) 500 MG tablet Take 1 tablet (500 mg total) by mouth 2 (two) times daily with a meal. 180 tablet 3   PRADAXA 150 MG CAPS capsule Take 150 mg by mouth every 12 (twelve) hours.     saccharomyces boulardii (FLORASTOR) 250 MG capsule Take 1 capsule (250 mg total) by mouth 2 (two) times daily.     sacubitril-valsartan (ENTRESTO) 97-103 MG TAKE ONE TABLET BY MOUTH TWICE A DAY 60 tablet 1   sitaGLIPtin (JANUVIA) 100 MG tablet TAKE ONE (1) TABLET BY MOUTH EVERY DAY 90 tablet 3   spironolactone (ALDACTONE) 25 MG tablet TAKE ONE (1) TABLET BY MOUTH EVERY DAY 90 tablet 3   SYNTHROID 50 MCG tablet TAKE ONE TABLET BY MOUTH DAILY BEFORE BREAKFAST 90 tablet 3   vitamin B-12 (CYANOCOBALAMIN) 500 MCG tablet Take 1,000 mcg by mouth daily.     triamcinolone (KENALOG) 0.1 % Apply 1 application topically 2 (two) times daily. For 7-10 days maximum (Patient not taking: Reported on 08/10/2021) 80 g 0   No current facility-administered medications for this visit.     Objective:  BP 100/62 (BP Location: Right Arm, Patient Position: Sitting, Cuff Size: Large)   Pulse 95   Temp 98.1 F (36.7 C) (Temporal)   Ht 5' 8"  (1.727 m)   Wt 232 lb 8 oz (105.5 kg)   SpO2 96%   BMI 35.35 kg/m  Gen: NAD, resting comfortably Skin: warm, dry GU: Unable to fully retract foreskin-there is blood noted in the underwear as well as blood in the visible portion of the penis-this appears to be potential phimosis-we will do urgent referral to urology  Shoulder Musculoskeletal Exam Good range of motion of shoulder.  Negative empty can, Hawkins, Neer test.  Patient does have pain just medial to shoulder blade-when he crosses his left arm over his body able to palpate this more easily with more significant  pain-pinpoint area-possible muscle strain    Assessment and Plan     #Left shoulder blade pain S: Several weeks of pain-hurts worse with certain movements- leaning back against fixed object helps.  Tylenol gives him some relief. Picked up 50 lb of dog food and felt pain afterwards- no pop. No weakness in arm. No neck pain. Still able to reach out. History of shoulder damage from water skiing years ago. No warmth over shoulder. No fever or chills.  A/P: Patient with reasonable range of motion with left shoulder without any significant signs of rotator cuff damage other than prior injury.  He has more significant pain just medial to his left shoulder blade-when he crosses his  arm over there is a pinpoint area-I think he is strained a muscle in this area.  We are going to try ice regimen followed by heat after 3 days-if fails to improve in the next week we can refer to physical therapy.  He will reach out.  Tylenol is fine for pain as well as Salonpas-has already been using this   #Rash S: Patient complains of rash in groin (irritation on foreskin where he wears depends also some toward the back/anus- this area was very red this morning). Penis foreskin was irritated enough to bleed. ROS-not ill appearing, no fever/chills. No new medications. Not immunocompromised. No mucus membrane involvement.  A/P:  blood/irritation at penis appears to be paraphimosis- very red and irritated- urgent urology referral- concern for candidal balanitis as well- will cover with clotrimazole  Also has pressure ulceration at top of buttocks-around anus actually is normal appearance-discussed frequent position changes and recheck in 3 weeks-certainly sooner if needed if worsening  Recommended follow up: Return for as needed for new, worsening, persistent symptoms. Future Appointments  Date Time Provider [place]  08/19/2021  9:45 AM [facility]-CCM CARE [facility] P[facility]  09/01/2021 11:00 AM Marin Olp, MD  [facility] PEC  10/20/2021 11:00 AM Garvin Fila, MD GNA-GNA None  11/10/2021  3:00 PM Erenest Rasher, PA-C RGA-RGA RGA  11/12/2021  3:15 PM [facility] HEALTH COACH [facility] PEC  11/18/2021  4:00 PM Philemon Kingdom, MD [facility]PC-LBENDO None  03/31/2022  1:30 PM Rankin, Clent Demark, MD RDE-RDE None   Time Spent: 25 minutes of total time (8:50 AM- 9:15 AM) was spent on the date of the encounter performing the following actions: chart review prior to seeing the patient, obtaining history, performing a medically necessary exam, counseling on the treatment plan, placing orders, and documenting in our EHR.    Lab/Order associations:   ICD-10-CM   1. Left shoulder pain, unspecified chronicity  M25.512     2. Paraphimosis  N47.2 Ambulatory referral to Urology     Meds ordered this encounter  Medications   Clotrimazole 1 % OINT    Sig: Apply to penis twice daily after saline bath/rinse and pat dry    Dispense:  56.7 g    Refill:  0    Return precautions advised.  Garret Reddish, MD

## 2021-08-10 NOTE — Patient Instructions (Addendum)
Health Maintenance Due  Topic Date Due   COVID-19 Vaccine - please get bivalent vaccination 11/03/2020   OPHTHALMOLOGY EXAM - Sign release of information at the check out desk for diabetic eye exa  11/10/2020   I want you to ice your shoulder blade 3x a day for 20 minutes. After 3 days I want you to try heat 3x a day for 20 minutes.   Urgent referral to urology - team please set this up for him  A lot of position changes for the pressure on upper buttocks- this can worsen if you are not doing frequent position changes  Recommended follow up: Return for as needed for new, worsening, persistent symptoms. Otherwise see you later this month

## 2021-08-11 DIAGNOSIS — N471 Phimosis: Secondary | ICD-10-CM | POA: Diagnosis not present

## 2021-08-13 ENCOUNTER — Telehealth: Payer: Self-pay | Admitting: Cardiovascular Disease

## 2021-08-13 NOTE — Telephone Encounter (Signed)
   Name: [name]  DOB: 09/29/1946  MRN: [id number]  Primary Cardiologist: Sherren Mocha, MD  Chart reviewed as part of pre-operative protocol coverage. Because of [name]'s past medical history and time since last visit, he will require a follow-up visit in order to better assess preoperative cardiovascular risk.   Pre-op covering staff: - Please schedule appointment and call patient to inform them. Please add "pre-op clearance" to the appointment notes so provider is aware. - Please contact requesting surgeon's office via preferred method (i.e, phone, fax) to inform them of need for appointment prior to surgery.   [name]: This patient has a history of CAD s/p PCI/DES to LAD in 2004 with subsequent STEMI in 2008 with very late stent thrombosis s/p PTCA/DES to LAD.  His last ischemic evaluation was in [facility] in 2019 which showed stable coronary anatomy.  He has been maintained on Plavix in addition to Pradaxa for his A. fib.  He is now planning for circumcision.  Any objections to holding Plavix in anticipation of surgery?  Please route your response back to P CV DIV PREOP. Thanks!   Pharmacy: Patient has a history of chronic atrial fibrillation on Pradaxa.  Please provide recommendations for holding Pradaxa prior to upcoming circumcision.  Abigail Butts, PA-C  08/13/2021, 2:09 PM

## 2021-08-13 NOTE — Telephone Encounter (Signed)
   [facility] Pre-operative Risk Assessment    Patient Name: [name]  DOB: 01-Apr-1946 MRN: [id number]  HEARTCARE STAFF:  - IMPORTANT!!!!!! Under Visit Info/Reason for Call, type in Other and utilize the format Clearance MM/DD/YY or Clearance TBD. Do not use dashes or single digits. - Please review there is not already an duplicate clearance open for this procedure. - If request is for dental extraction, please clarify the # of teeth to be extracted. - If the patient is currently at the dentist's office, call Pre-Op Callback Staff (MA/nurse) to input urgent request.  - If the patient is not currently in the dentist office, please route to the Pre-Op pool.  Request for surgical clearance:  What type of surgery is being performed? Circumcision  When is this surgery scheduled? TBD  What type of clearance is required (medical clearance vs. Pharmacy clearance to hold med vs. Both)? Both  Are there any medications that need to be held prior to surgery and how long? Plavix for 7 days, Pradaxa 5 days   Practice name and name of physician performing surgery? [facility]/ Dr. Harold Barban  What is the office phone number? [phone] ext. [phone]   7.   What is the office fax number? [phone]  8.   Anesthesia type (None, local, MAC, general) ? General    Kamira J Martinique 08/13/2021, 12:50 PM  _________________________________________________________________   (provider comments below)

## 2021-08-15 NOTE — Telephone Encounter (Signed)
Pt at low risk of holding clopidogrel 5 days before surgery if needed. thanks

## 2021-08-16 NOTE — Telephone Encounter (Signed)
Left message for the pt to call to schedule an appt for pre op clearance. Appt can be with [name] or any APP. At the time of my call today, [name] did have an appt open 09/07/21 @ 10 am or pt can be scheduled 09/08/21 @ 8:45 pre op time slot with Ermalinda Barrios, PAC.

## 2021-08-16 NOTE — Telephone Encounter (Signed)
I was able to reach pt on cell # [phone]. I explained that he needs an appt for pre op clearance. I stated I had left messages x 2 on his preferred #, one earlier this morning and just now that he can disregard. Pt is agreeable to appt for pre op clearance with Dr. Johnsie Cancel 08/17/21 @ 3:30 at [facility] Location. Pt aware to arrive 15 minutes early for registration. I will forward notes to MD and his nurse Elijio Miles RN. Will also send FYI to requesting office pt has appt 08/17/21.

## 2021-08-16 NOTE — Telephone Encounter (Signed)
Patient with diagnosis of afib on Pradaxa for anticoagulation.    Procedure: circumcision Date of procedure: TBD  CHA2DS2-VASc Score = 8  This indicates a 10.8% annual risk of stroke. The patient's score is based upon: CHF History: 1 HTN History: 1 Diabetes History: 1 Stroke History: 2 Vascular Disease History: 1 Age Score: 2 Gender Score: 0   CrCl 20m/min using adjusted body weight Platelet count 167K  Request is to hold Pradaxa for 5 days, will not need hold that long for DOAC since it's cleared quicker than alternative anticoagulants like warfarin. Per office protocol, patient can hold Pradaxa for 2 days prior to procedure based on renal function, pt's CV risk, and bleed risk of procedure.

## 2021-08-16 NOTE — Telephone Encounter (Signed)
Will send FYI to requesting office pt needs appt with cardiology.

## 2021-08-17 ENCOUNTER — Ambulatory Visit (INDEPENDENT_AMBULATORY_CARE_PROVIDER_SITE_OTHER): Payer: Medicare Other | Admitting: Cardiovascular Disease

## 2021-08-17 ENCOUNTER — Encounter: Payer: Self-pay | Admitting: Cardiovascular Disease

## 2021-08-17 ENCOUNTER — Other Ambulatory Visit: Payer: Self-pay

## 2021-08-17 VITALS — BP 116/68 | HR 79 | Ht 68.0 in | Wt 232.0 lb

## 2021-08-17 DIAGNOSIS — Z01810 Encounter for preprocedural cardiovascular examination: Secondary | ICD-10-CM

## 2021-08-17 DIAGNOSIS — I2510 Atherosclerotic heart disease of native coronary artery without angina pectoris: Secondary | ICD-10-CM

## 2021-08-17 NOTE — Patient Instructions (Signed)
Medication Instructions:  *If you need a refill on your cardiac medications before your next appointment, please call your pharmacy*  Lab Work: If you have labs (blood work) drawn today and your tests are completely normal, you will receive your results only by: [place] (if you have MyChart) OR A paper copy in the mail If you have any lab test that is abnormal or we need to change your treatment, we will call you to review the results.  Testing/Procedures: None ordered today.  Follow-Up: At [facility], you and your health needs are our priority.  As part of our continuing mission to provide you with exceptional heart care, we have created designated Provider Care Teams.  These Care Teams include your primary Cardiologist (physician) and Advanced Practice Providers (APPs -  Physician Assistants and Nurse Practitioners) who all work together to provide you with the care you need, when you need it.  We recommend signing up for the patient portal called "MyChart".  Sign up information is provided on this After Visit Summary.  MyChart is used to connect with patients for Virtual Visits (Telemedicine).  Patients are able to view lab/test results, encounter notes, upcoming appointments, etc.  Non-urgent messages can be sent to your provider as well.   To learn more about what you can do with MyChart, go to [email or web address].    Your next appointment:   Next available  The format for your next appointment:   In Person  Provider:   Sherren Mocha, MD

## 2021-08-17 NOTE — Progress Notes (Signed)
CARDIOLOGY CONSULT NOTE       Patient ID: [name] MRN: [id number] DOB/AGE: Jan 21, 1946 75 y.o.  Admit date: (Not on file) Referring Physician: Burt Knack Primary Physician: Marin Olp, MD Primary Cardiologist: Burt Knack Reason for Consultation: Preoperative  Active Problems:   * No active hospital problems. *   HPI:  Asked to see as add on DOD by Dr Burt Knack for preoperative clearance Patient needs a circumcision by Dr Milford Cage Urology Has not been scheduled yet. History of CAD with DES to LAD in 2004 with late stent thrombosis STEMI 2008 He has HTN, HLD former smoker and DM-2.  His EF has been as low as 25-30% July 2019 and repeat cath at that time showed patent LAD stents TTE March 2022 with normalized EF 50-55% He is on Pradaxa for PAF and plavix for his CAD.   He does not really want to have surgery Got some ointment from Dr Yong Channel to help   ROS All other systems reviewed and negative except as noted above  Past Medical History:  Diagnosis Date   B12 deficiency    per patient previously taking shots   C. difficile colitis 04/17/2019   C. difficile diarrhea    CAD (coronary artery disease), native coronary artery 04/12/2007   Chronic atrial fibrillation ([place])    initial diagnoses 2012   Chronic combined systolic and diastolic CHF (congestive heart failure) ([place]) 04/15/2019   CKD (chronic kidney disease), stage III ([place]) 04/15/2019   CORONARY ARTERY DISEASE 04/12/2007   2 stents last in 2006.    Cystoid macular edema of right eye 01/16/2020   Cystoid macular edema of right eye 01/16/2020   DIABETES MELLITUS, TYPE II 04/16/2007   Diverticulosis of colon (without mention of hemorrhage)    GERD (gastroesophageal reflux disease) 08/09/2011   Hyperlipidemia 04/16/2007   10/24 reveal study end. Atorvastatin 43m   HYPERTENSION 04/12/2007   HYPOTHYROIDISM 04/12/2007   Ischemic cardiomyopathy    Morbid obesity ([facility] 08/09/2011   MYOCARDIAL INFARCTION, HX OF 04/12/2007   Obstructive sleep  apnea 04/12/2007   02/2011 - AHI 96/h CPAP 13, Lg FF      Stroke (HCC)    Type II diabetes mellitus with peripheral circulatory disorder ([facility] 04/16/2007   Dr. GCruzita Lederermanages. Uses fructosamine.     ULCERATIVE COLITIS, LEFT SIDED 11/23/2010   Ulcerative pancolitis without complication ([facility] 181/82/9937  Ulcerative Colitis. Mesalamine.    VITAMIN B12 DEFICIENCY 11/29/2010    Family History  Problem Relation Age of Onset   Heart attack Mother        mid 725s  Heart disease Father        H/O CAD, CABG, VALVE SURGERY   Hypertension Brother    Obesity Brother    Stomach cancer Maternal Aunt    Stomach cancer Paternal Grandmother        ? colon    Colon cancer Neg Hx     Social History   Socioeconomic History   Marital status: Married    Spouse name: Not on file   Number of children: 1   Years of education: Not on file   Highest education level: Not on file  Occupational History   Occupation: RETIRED    Employer: RETIRED  Tobacco Use   Smoking status: Former    Packs/day: 1.00    Years: 5.00    Pack years: 5.00    Types: Cigarettes    Quit date: 04/13/1966    Years since quitting: 5313  Smokeless tobacco: Never  Vaping Use   Vaping Use: Never used  Substance and Sexual Activity   Alcohol use: No    Alcohol/week: 0.0 standard drinks    Comment: no more beer   Drug use: No   Sexual activity: Not on file  Other Topics Concern   Not on file  Social History Narrative   Cardiorehab 3 days a week. 45 minutes to an hour-stationary bike.    GRANDDAUGHTER (MS. 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       Married for 37 years in 2015, married previously for 14 years. Daughter with first wife and 3 grandkids.    Lives alone with wife. Get to see grandkids a lot. New grandchild in middle of 30      Hobbies-yardwork, previously liked to hunt and fish, does some target shooting    Social Determinants of Health   Financial Resource Strain: Low Risk    Difficulty of Paying Living Expenses: Not hard at all  Food Insecurity: No Food Insecurity   Worried About Charity fundraiser in the Last Year: Never true   Arboriculturist in the Last Year: Never true  Transportation Needs: No Transportation Needs   Lack of Transportation (Medical): No   Lack of Transportation (Non-Medical): No  Physical Activity: Inactive   Days of Exercise per Week: 0 days   Minutes of Exercise per Session: 0 min  Stress: No Stress Concern Present   Feeling of Stress : Not at all  Social Connections: Moderately Integrated   Frequency of Communication with Friends and Family: More than three times a week   Frequency of Social Gatherings with Friends and Family: More than three times a week   Attends Religious Services: More than 4 times per year   Active Member of [employer] or Organizations: No   Attends Archivist Meetings: Never   Marital Status: Married  Human resources officer Violence: Not At Risk   Fear of Current or Ex-Partner: No   Emotionally Abused: No   Physically Abused: No   Sexually Abused: No    Past Surgical History:  Procedure Laterality Date   CARDIAC CATHETERIZATION  10/2002   STENT. 2 stents Dr. Maurene Capes   CARDIAC CATHETERIZATION N/A 10/28/2015   Procedure: Right/Left Heart Cath and Coronary Angiography;  Surgeon: Sherren Mocha, MD;  Location: [facility];  Service: Cardiovascular;  Laterality: N/A;   CATARACT EXTRACTION     2021 bilateral eyes    CORONARY STENT PLACEMENT  2008   LAD    RIGHT/LEFT HEART CATH AND CORONARY ANGIOGRAPHY N/A 06/01/2018   Procedure: RIGHT/LEFT HEART CATH AND CORONARY ANGIOGRAPHY;  Surgeon: Sherren Mocha, MD;  Location: [facility];  Service: Cardiovascular;  Laterality: N/A;   TONSILLECTOMY        Current Outpatient Medications:    acetaminophen (TYLENOL) 500 MG tablet, Take 1,000 mg by mouth every 6 (six) hours as  needed., Disp: , Rfl:    APRISO 0.375 g 24 hr capsule, Take 4 capsules (1.5 g total) by mouth daily., Disp: 120 capsule, Rfl: 5   atorvastatin (LIPITOR) 20 MG tablet, TAKE ONE TABLET (20MG TOTAL) BY MOUTH DAILY, Disp: 30 tablet, Rfl: 5   carvedilol (COREG) 25 MG tablet, TAKE ONE TABLET BY MOUTH TWICE A DAY, Disp: 180 tablet, Rfl: 3   clopidogrel (PLAVIX) 75  MG tablet, TAKE ONE TABLET (75MG TOTAL) BY MOUTH DAILY, Disp: 90 tablet, Rfl: 3   Clotrimazole 1 % OINT, Apply to penis twice daily after saline bath/rinse and pat dry, Disp: 56.7 g, Rfl: 0   Coenzyme Q10 (COQ10 PO), Take 1 capsule by mouth daily with supper., Disp: , Rfl:    dicyclomine (BENTYL) 10 MG capsule, TAKE ONE CAPSULE (10MG TOTAL) BY MOUTH TWO TIMES DAILY AS NEEDED FOR DIARRHEA OR ABDOMINAL CRAMPING, Disp: 90 capsule, Rfl: 3   empagliflozin (JARDIANCE) 10 MG TABS tablet, TAKE ONE (1) TABLET BY MOUTH EVERY DAY, Disp: 90 tablet, Rfl: 3   fish oil-omega-3 fatty acids 1000 MG capsule, Take 1 g by mouth daily with supper., Disp: , Rfl:    furosemide (LASIX) 40 MG tablet, Take 1 tablet (40 mg total) by mouth 2 (two) times daily., Disp: 180 tablet, Rfl: 3   glucose blood test strip, Use as instructed to test 4 time daily, Disp: 400 each, Rfl: 3   isosorbide mononitrate (IMDUR) 60 MG 24 hr tablet, TAKE ONE (1) TABLET BY MOUTH EVERY DAY, Disp: 90 tablet, Rfl: 3   metFORMIN (GLUCOPHAGE) 500 MG tablet, Take 1 tablet (500 mg total) by mouth 2 (two) times daily with a meal., Disp: 180 tablet, Rfl: 3   PRADAXA 150 MG CAPS capsule, Take 150 mg by mouth every 12 (twelve) hours., Disp: , Rfl:    saccharomyces boulardii (FLORASTOR) 250 MG capsule, Take 1 capsule (250 mg total) by mouth 2 (two) times daily., Disp:  , Rfl:    sacubitril-valsartan (ENTRESTO) 97-103 MG, TAKE ONE TABLET BY MOUTH TWICE A DAY, Disp: 60 tablet, Rfl: 1   sitaGLIPtin (JANUVIA) 100 MG tablet, TAKE ONE (1) TABLET BY MOUTH EVERY DAY, Disp: 90 tablet, Rfl: 3   spironolactone  (ALDACTONE) 25 MG tablet, TAKE ONE (1) TABLET BY MOUTH EVERY DAY, Disp: 90 tablet, Rfl: 3   SYNTHROID 50 MCG tablet, TAKE ONE TABLET BY MOUTH DAILY BEFORE BREAKFAST, Disp: 90 tablet, Rfl: 3   triamcinolone (KENALOG) 0.1 %, Apply 1 application topically 2 (two) times daily. For 7-10 days maximum, Disp: 80 g, Rfl: 0   vitamin B-12 (CYANOCOBALAMIN) 500 MCG tablet, Take 1,000 mcg by mouth daily., Disp: , Rfl:     Physical Exam: Blood pressure 116/68, pulse 79, height 5' 8"  (1.727 m), weight 232 lb (105.2 kg), SpO2 96 %.   Obese male No murmurs No bruits Lungs clear Abdomen benign Plus one bilateral edema with varicosities  Palpable DP/PT  Labs:   Lab Results  Component Value Date   WBC 9.6 01/13/2021   HGB 16.9 01/13/2021   HCT 49.5 01/13/2021   MCV 88.8 01/13/2021   PLT 167.0 01/13/2021   No results for input(s): NA, K, CL, CO2, BUN, CREATININE, CALCIUM, PROT, BILITOT, ALKPHOS, ALT, AST, GLUCOSE in the last 168 hours.  Invalid input(s): LABALBU Lab Results  Component Value Date   TROPONINI <0.03 10/24/2015    Lab Results  Component Value Date   CHOL 79 03/03/2021   CHOL 84 02/24/2020   CHOL 81 02/27/2019   Lab Results  Component Value Date   HDL 55.90 03/03/2021   HDL 51.60 02/24/2020   HDL 49.20 02/27/2019   Lab Results  Component Value Date   LDLCALC 6 03/03/2021   LDLCALC 12 02/24/2020   LDLCALC 1 02/27/2019   Lab Results  Component Value Date   TRIG 86.0 03/03/2021   TRIG 29 12/28/2020   TRIG 105.0 02/24/2020   Lab Results  Component Value Date   CHOLHDL 1 03/03/2021   CHOLHDL 2 02/24/2020   CHOLHDL 2 02/27/2019   Lab Results  Component Value Date   LDLDIRECT 18.0 06/13/2017   LDLDIRECT 12.0 08/11/2016   LDLDIRECT 12.8 04/02/2013      Radiology: No results found.  EKG: afib LBBB rate 144 12/27/20   ASSESSMENT AND PLAN:   Preoperative:  Issues involve history of CAD, afib , CHF and anticoagulation. Low risk surgery involving circumcision  Able to do 4 mets no recent angina and EF more recently improved by TTE 12/28/20 50-55% Ok to hold plavix 5 days before surgery (stenting distant) Ok to hold Pradaxa 2 days before procedure. Continue coreg for rate control His volume status is stable on lasix and aldactone. Continue Delene Loll and Jardiance regarding his medication for CHF  Can f/u with Dr Burt Knack next available    Signed: Jenkins Rouge 08/17/2021, 3:44 PM

## 2021-08-18 NOTE — Addendum Note (Signed)
Addended by: Glean Salen on: 08/18/2021 04:24 PM   Modules accepted: Orders

## 2021-08-19 ENCOUNTER — Ambulatory Visit (INDEPENDENT_AMBULATORY_CARE_PROVIDER_SITE_OTHER): Payer: Medicare Other | Admitting: *Deleted

## 2021-08-19 DIAGNOSIS — E1151 Type 2 diabetes mellitus with diabetic peripheral angiopathy without gangrene: Secondary | ICD-10-CM

## 2021-08-19 DIAGNOSIS — I5022 Chronic systolic (congestive) heart failure: Secondary | ICD-10-CM

## 2021-08-19 DIAGNOSIS — I10 Essential (primary) hypertension: Secondary | ICD-10-CM

## 2021-08-19 NOTE — Patient Instructions (Signed)
Visit Information  Patient Goals/Self-Care Activities: Patient will self administer medications as prescribed as evidenced by self report/primary caregiver report  Patient will attend all scheduled provider appointments as evidenced by clinician review of documented attendance to scheduled appointments and patient/caregiver report Patient will call pharmacy for medication refills as evidenced by patient report and review of pharmacy fill history as appropriate Patient will call provider office for new concerns or questions as evidenced by review of documented incoming telephone call notes and patient report call office if I gain more than 2 pounds in one day or 5 pounds in one week keep legs up while sitting track weight in diary use salt in moderation watch for swelling in feet, ankles and legs every day weigh myself daily develop a rescue plan follow rescue plan if symptoms flare-up schedule appointment with eye doctor check blood sugar at prescribed times: once daily check feet daily for cuts, sores or redness enter blood sugar readings and medication or insulin into daily log take the blood sugar log to all doctor visits - check blood pressure daily - write blood pressure results in a log or diary - keep a blood pressure log - take blood pressure log to all doctor appointments - call doctor for signs and symptoms of high blood pressure - develop an action plan for high blood pressure   Patient verbalizes understanding of instructions provided today and agrees to view in [place].   The care management team will reach out to the patient again over the next 45 days.   Hubert Azure RN, MSN RN Care Management Coordinator  [facility] [phone] [email or web address]

## 2021-08-19 NOTE — Chronic Care Management (AMB) (Signed)
Chronic Care Management   CCM RN Visit Note  08/19/2021 Name: [name] MRN: [id number] DOB: [birth year]  Subjective: [name] is a 75 y.o. year old male who is a primary care patient of Yong Channel, Brayton Mars, MD. The care management team was consulted for assistance with disease management and care coordination needs.    Engaged with patient by telephone for follow up visit in response to provider referral for case management and/or care coordination services.   Consent to Services:  The patient was given information about Chronic Care Management services, agreed to services, and gave verbal consent prior to initiation of services.  Please see initial visit note for detailed documentation.   Patient agreed to services and verbal consent obtained.   Assessment: Review of patient past medical history, allergies, medications, health status, including review of consultants reports, laboratory and other test data, was performed as part of comprehensive evaluation and provision of chronic care management services.   SDOH (Social Determinants of Health) assessments and interventions performed:    CCM Care Plan  Allergies  Allergen Reactions   Clindamycin/Lincomycin     Developed C. difficile colitis 1 month after use    Outpatient Encounter Medications as of 08/19/2021  Medication Sig   acetaminophen (TYLENOL) 500 MG tablet Take 1,000 mg by mouth every 6 (six) hours as needed.   APRISO 0.375 g 24 hr capsule Take 4 capsules (1.5 g total) by mouth daily.   atorvastatin (LIPITOR) 20 MG tablet TAKE ONE TABLET (20MG TOTAL) BY MOUTH DAILY   carvedilol (COREG) 25 MG tablet TAKE ONE TABLET BY MOUTH TWICE A DAY   clopidogrel (PLAVIX) 75 MG tablet TAKE ONE TABLET (75MG TOTAL) BY MOUTH DAILY   Clotrimazole 1 % OINT Apply to penis twice daily after saline bath/rinse and pat dry   Coenzyme Q10 (COQ10 PO) Take 1 capsule by mouth daily with supper.   dicyclomine (BENTYL) 10 MG capsule TAKE  ONE CAPSULE (10MG TOTAL) BY MOUTH TWO TIMES DAILY AS NEEDED FOR DIARRHEA OR ABDOMINAL CRAMPING   empagliflozin (JARDIANCE) 10 MG TABS tablet TAKE ONE (1) TABLET BY MOUTH EVERY DAY   fish oil-omega-3 fatty acids 1000 MG capsule Take 1 g by mouth daily with supper.   furosemide (LASIX) 40 MG tablet Take 1 tablet (40 mg total) by mouth 2 (two) times daily.   glucose blood test strip Use as instructed to test 4 time daily   isosorbide mononitrate (IMDUR) 60 MG 24 hr tablet TAKE ONE (1) TABLET BY MOUTH EVERY DAY   metFORMIN (GLUCOPHAGE) 500 MG tablet Take 1 tablet (500 mg total) by mouth 2 (two) times daily with a meal.   PRADAXA 150 MG CAPS capsule Take 150 mg by mouth every 12 (twelve) hours.   saccharomyces boulardii (FLORASTOR) 250 MG capsule Take 1 capsule (250 mg total) by mouth 2 (two) times daily.   sacubitril-valsartan (ENTRESTO) 97-103 MG TAKE ONE TABLET BY MOUTH TWICE A DAY   sitaGLIPtin (JANUVIA) 100 MG tablet TAKE ONE (1) TABLET BY MOUTH EVERY DAY   spironolactone (ALDACTONE) 25 MG tablet TAKE ONE (1) TABLET BY MOUTH EVERY DAY   SYNTHROID 50 MCG tablet TAKE ONE TABLET BY MOUTH DAILY BEFORE BREAKFAST   triamcinolone (KENALOG) 0.1 % Apply 1 application topically 2 (two) times daily. For 7-10 days maximum   vitamin B-12 (CYANOCOBALAMIN) 500 MCG tablet Take 1,000 mcg by mouth daily.   No facility-administered encounter medications on file as of 08/19/2021.    Patient Active Problem List  Diagnosis Date Noted   Posterior capsular opacification, left 06/30/2021   Lactose intolerance 01/06/2021   Fecal incontinence 01/06/2021   Acute on chronic respiratory failure with hypoxia ([place]) 12/27/2020   Atrial fibrillation with RVR (HCC)    Acute encephalopathy    Moderate nonproliferative diabetic retinopathy of both eyes ([place]) 01/16/2020   Atherosclerotic heart disease of native coronary artery with other forms of angina pectoris ([place]) 04/26/2019   C. difficile colitis 04/17/2019    Dehydration 04/15/2019   Diarrhea 04/15/2019   ARF (acute renal failure) ([place]) 04/15/2019   Chronic combined systolic and diastolic CHF (congestive heart failure) ([place]) 04/15/2019   Hypokalemia 04/15/2019   AKI (acute kidney injury) ([place])    Acute on chronic combined systolic and diastolic heart failure ([name]) 02/07/2018   Former smoker 08/11/2016   MCI (mild cognitive impairment) 01/19/2016   History of transient ischemic attack (TIA) 10/24/2015   Primary osteoarthritis of left knee 02/09/2015   CHF (congestive heart failure), NYHA class II ([place]) 05/09/2014   Allergic rhinitis 06/25/2012   Ulcerative pancolitis without complication ([place]) 76/72/0947   GERD (gastroesophageal reflux disease) 08/09/2011   Morbid obesity ([place]) 08/09/2011   Atrial fibrillation ([place]) 12/08/2010   VITAMIN B12 DEFICIENCY 11/29/2010   Type II diabetes mellitus with peripheral circulatory disorder ([place]) 04/16/2007   Hyperlipidemia 04/16/2007   Hypothyroidism 04/12/2007   Essential hypertension 04/12/2007   CAD (coronary artery disease), native coronary artery 04/12/2007   Obstructive sleep apnea 04/12/2007    Conditions to be addressed/monitored:CHF, HTN, and DMII  Care Plan : [place] (Adult)  Updates made by Leona Singleton, RN since 08/19/2021 12:00 AM   Problem: Knowledge deficit related to self care management of chronic conditions   Priority: Medium   Long-Range Goal: Patient will work with chronic care management team to learn better self health management   Start Date: 08/19/2021  Expected End Date: 01/07/2022  This Visit's Progress: On track  Priority: Medium  Note:   Current Barriers:  Knowledge Deficits related to plan of care for management of CHF, HTN, and DMII  Chronic Disease Management support and education needs related to CHF, HTN, and DMII  states he feels great.  Denies any swelling, shortness of breath, chest pains, light headiness, dizziness.  Weight this  morning 232 pounds, blood sugar 105 with Ranges of 110, blood pressure 112/88.  Planning for urology procedure soon  RNCM Clinical Goal(s):  Patient will verbalize understanding of plan for management of CHF, HTN, and DMII as evidenced by daily weight monitoring, monitoring BP at least twice a week; checking blood sugars at least daily verbalize basic understanding of CHF, HTN, and DMII disease process and self health management plan as evidenced by less shortness of breath, no Emergency Room visits or hospitalizations in the next 90 days      take all medications exactly as prescribed and will call provider for medication related questions as evidenced by not skipping    continue to work with Consulting civil engineer and/or Social Worker to address care management and care coordination needs related to CHF, HTN, and DMII as evidenced by adherence to CM Team Scheduled appointments     through collaboration with Consulting civil engineer, provider, and care team.  Patient will maintain Hgb A1C of <7   Interventions: 1:1 collaboration with primary care provider regarding development and update of comprehensive plan of care as evidenced by provider attestation and co-signature Inter-disciplinary care team collaboration (see longitudinal plan of care)  Evaluation of current treatment plan related to  self management and patient's adherence to plan as established by provider  Heart Failure Interventions:  (Status: Goal on Track (progressing): YES.)  Long Term Goal  Basic overview and discussion of pathophysiology of Heart Failure reviewed Reviewed Heart Failure Action Plan in depth and provided written copy Assessed need for readable accurate scales in home Advised patient to weigh each morning after emptying bladder Discussed importance of daily weight and advised patient to weigh and record daily Reviewed role of diuretics in prevention of fluid overload and management of heart failure Discussed the importance of  keeping all appointments with provider  Diabetes:  (Status: Goal on Track (progressing): YES.) Long Term Goal   Lab Results  Component Value Date   HGBA1C 6.9 (A) 07/14/2021  Assessed patient's understanding of A1c goal: <7% Provided education to patient about basic DM disease process; Reviewed medications with patient and discussed importance of medication adherence;        Reviewed prescribed diet with patient low salt,carbohydrate modified; Discussed plans with patient for ongoing care management follow up and provided patient with direct contact information for care management team;      Reviewed scheduled/upcoming provider appointments including: with Urology;         call provider for findings outside established parameters;         Hypertension: (Status: Goal on Track (progressing): YES.)  Long Term Last practice recorded BP readings:  BP Readings from Last 3 Encounters:  08/17/21 116/68  08/10/21 100/62  07/14/21 108/76  Most recent eGFR/CrCl: No results found for: EGFR  No components found for: CRCL  Evaluation of current treatment plan related to hypertension self management and patient's adherence to plan as established by provider;   Reviewed medications with patient and discussed importance of compliance;  Counseled on adverse effects of illicit drug and excessive alcohol use in patients with high blood pressure;  Counseled on the importance of exercise goals with target of 150 minutes per week Discussed plans with patient for ongoing care management follow up and provided patient with direct contact information for care management team; Discussed complications of poorly controlled blood pressure such as heart disease, stroke, circulatory complications, vision complications, kidney impairment, sexual dysfunction;    Patient Goals/Self-Care Activities: Patient will self administer medications as prescribed as evidenced by self report/primary caregiver report  Patient will  attend all scheduled provider appointments as evidenced by clinician review of documented attendance to scheduled appointments and patient/caregiver report Patient will call pharmacy for medication refills as evidenced by patient report and review of pharmacy fill history as appropriate Patient will call provider office for new concerns or questions as evidenced by review of documented incoming telephone call notes and patient report call office if I gain more than 2 pounds in one day or 5 pounds in one week keep legs up while sitting track weight in diary use salt in moderation watch for swelling in feet, ankles and legs every day weigh myself daily develop a rescue plan follow rescue plan if symptoms flare-up schedule appointment with eye doctor check blood sugar at prescribed times: once daily check feet daily for cuts, sores or redness enter blood sugar readings and medication or insulin into daily log take the blood sugar log to all doctor visits - check blood pressure daily - write blood pressure results in a log or diary - keep a blood pressure log - take blood pressure log to all doctor appointments - call doctor for  signs and symptoms of high blood pressure - develop an action plan for high blood pressure       Plan:The care management team will reach out to the patient again over the next 45 days. Hubert Azure RN, MSN RN Care Management Coordinator  [place] [phone] [email or web address]

## 2021-08-30 NOTE — Progress Notes (Signed)
Phone [phone] In person visit   Subjective:   [name] is a 75 y.o. year old very pleasant male patient who presents for/with See problem oriented charting Chief Complaint  Patient presents with   Follow-up    Pain on genital area Pain clear since last visit    Rash    On rectal area, clear since last visit     This visit occurred during the SARS-CoV-2 public health emergency.  Safety protocols were in place, including screening questions prior to the visit, additional usage of staff PPE, and extensive cleaning of exam room while observing appropriate contact time as indicated for disinfecting solutions.   Past Medical History-  Patient Active Problem List   Diagnosis Date Noted   History of transient ischemic attack (TIA) 10/24/2015    Priority: High   CHF (congestive heart failure), NYHA class II ([place]) 05/09/2014    Priority: High   Ulcerative pancolitis without complication ([place]) 38/07/1750    Priority: High   Atrial fibrillation ([place]) 12/08/2010    Priority: High   Type II diabetes mellitus with peripheral circulatory disorder ([place]) 04/16/2007    Priority: High   CAD (coronary artery disease), native coronary artery 04/12/2007    Priority: High   Morbid obesity ([place]) 08/09/2011    Priority: Medium    Hyperlipidemia 04/16/2007    Priority: Medium    Hypothyroidism 04/12/2007    Priority: Medium    Essential hypertension 04/12/2007    Priority: Medium    Obstructive sleep apnea 04/12/2007    Priority: Medium    Former smoker 08/11/2016    Priority: Low   MCI (mild cognitive impairment) 01/19/2016    Priority: Low   Primary osteoarthritis of left knee 02/09/2015    Priority: Low   Allergic rhinitis 06/25/2012    Priority: Low   GERD (gastroesophageal reflux disease) 08/09/2011    Priority: Low   VITAMIN B12 DEFICIENCY 11/29/2010    Priority: Low   Posterior capsular opacification, left 06/30/2021   Lactose intolerance 01/06/2021   Fecal  incontinence 01/06/2021   Acute on chronic respiratory failure with hypoxia ([place]) 12/27/2020   Atrial fibrillation with RVR (HCC)    Acute encephalopathy    Moderate nonproliferative diabetic retinopathy of both eyes ([place]) 01/16/2020   Atherosclerotic heart disease of native coronary artery with other forms of angina pectoris ([place]) 04/26/2019   C. difficile colitis 04/17/2019   Dehydration 04/15/2019   Diarrhea 04/15/2019   ARF (acute renal failure) ([place]) 04/15/2019   Chronic combined systolic and diastolic CHF (congestive heart failure) ([place]) 04/15/2019   Hypokalemia 04/15/2019   AKI (acute kidney injury) ([place])    Acute on chronic combined systolic and diastolic heart failure ([place]) 02/07/2018    Medications- reviewed and updated Current Outpatient Medications  Medication Sig Dispense Refill   acetaminophen (TYLENOL) 500 MG tablet Take 1,000 mg by mouth every 6 (six) hours as needed.     APRISO 0.375 g 24 hr capsule Take 4 capsules (1.5 g total) by mouth daily. 120 capsule 5   atorvastatin (LIPITOR) 20 MG tablet TAKE ONE TABLET (20MG TOTAL) BY MOUTH DAILY 30 tablet 5   carvedilol (COREG) 25 MG tablet TAKE ONE TABLET BY MOUTH TWICE A DAY 180 tablet 3   clopidogrel (PLAVIX) 75 MG tablet TAKE ONE TABLET (75MG TOTAL) BY MOUTH DAILY 90 tablet 3   Clotrimazole 1 % OINT Apply to penis twice daily after saline bath/rinse and pat dry 56.7 g 0   Coenzyme Q10 (  COQ10 PO) Take 1 capsule by mouth daily with supper.     dicyclomine (BENTYL) 10 MG capsule TAKE ONE CAPSULE (10MG TOTAL) BY MOUTH TWO TIMES DAILY AS NEEDED FOR DIARRHEA OR ABDOMINAL CRAMPING 90 capsule 3   empagliflozin (JARDIANCE) 10 MG TABS tablet TAKE ONE (1) TABLET BY MOUTH EVERY DAY 90 tablet 3   fish oil-omega-3 fatty acids 1000 MG capsule Take 1 g by mouth daily with supper.     furosemide (LASIX) 40 MG tablet Take 1 tablet (40 mg total) by mouth 2 (two) times daily. 180 tablet 3   glucose blood test strip Use as instructed to test 4  time daily 400 each 3   isosorbide mononitrate (IMDUR) 60 MG 24 hr tablet TAKE ONE (1) TABLET BY MOUTH EVERY DAY 90 tablet 3   metFORMIN (GLUCOPHAGE) 500 MG tablet Take 1 tablet (500 mg total) by mouth 2 (two) times daily with a meal. 180 tablet 3   PRADAXA 150 MG CAPS capsule Take 150 mg by mouth every 12 (twelve) hours.     saccharomyces boulardii (FLORASTOR) 250 MG capsule Take 1 capsule (250 mg total) by mouth 2 (two) times daily.     sacubitril-valsartan (ENTRESTO) 97-103 MG TAKE ONE TABLET BY MOUTH TWICE A DAY 60 tablet 1   sitaGLIPtin (JANUVIA) 100 MG tablet TAKE ONE (1) TABLET BY MOUTH EVERY DAY 90 tablet 3   spironolactone (ALDACTONE) 25 MG tablet TAKE ONE (1) TABLET BY MOUTH EVERY DAY 90 tablet 3   SYNTHROID 50 MCG tablet TAKE ONE TABLET BY MOUTH DAILY BEFORE BREAKFAST 90 tablet 3   triamcinolone (KENALOG) 0.1 % Apply 1 application topically 2 (two) times daily. For 7-10 days maximum 80 g 0   vitamin B-12 (CYANOCOBALAMIN) 500 MCG tablet Take 1,000 mcg by mouth daily.     No current facility-administered medications for this visit.     Objective:  BP 96/62   Pulse 78   Temp 98.2 F (36.8 C) (Temporal)   Ht 5' 8"  (1.727 m)   Wt 232 lb 3.2 oz (105.3 kg)   SpO2 99%   BMI 35.31 kg/m  Gen: NAD, resting comfortably CV: RRR no murmurs rubs or gallops Lungs: CTAB no crackles, wheeze, rhonchi Ext: no edema Skin: warm, dry    Assessment and Plan   #Left shoulder blade pain- heating pad helped- occasional mild pain- probably muscular- resolved with tylenol.    #Phimosis with candidal infection-urgent urology referral last visit- concern for candidal balanitis as well- will cover with clotrimazole at that time-it was recommended that patient have circumcision by urology- he opted out. Wants to use cream as needed - was cleared for surgery by Dr. Johnsie Cancel on 08/17/21 if needed  #Memory loss-family was concerned in past-  noncompliance could be affected by dementia which may had led to  hospitalization was the concern.  -  We placed referral to neurology in the past (several reschedules and planned in January) - I was very impressed by his 30 out of 30 on MMSE on 5/22 visit and since his compliance was better with meds - we spaced visits to 6 months  #Congestive heart failure with ejection fraction of 25% during hospitalization 2022 S: Medication: Lasix 40 mg twice daily, Aldactone 25 mg, Entresto, Jardiance, carvedilol - weight stable over last month -No increased edema noted or reported -using CPAP now  A/P: heart failure stable- continue current meds   #CAD/atrial fibrillation/hyperlipidemia. Followed with Dr. Rayann Heman of cardiology S: Compliant with atorvastatin 16m-LDL been  at goal under 70 -no chest pain or shortness of breath   I prescribed his Plavix-with his CAD history-he was compliant with this medication on 05/22 visit.  He was also on Pradaxa for anticoagulation due to atrial fibrillation-reported compliance -had been excellent (but may not be covered).  Patient reported cardiology wanted him on both medications.  Patient was on carvedilol for rate contro Lab Results  Component Value Date   CHOL 79 03/03/2021   HDL 55.90 03/03/2021   LDLCALC 6 03/03/2021   LDLDIRECT 18.0 06/13/2017   TRIG 86.0 03/03/2021   CHOLHDL 1 03/03/2021   A/P: CAD asymptomatic- continue current meds Atrial fibrillation- asymptomatic- continue current meds but will check with insurance on eliquis vs xarelto as apparently pradaxa will not be covered next year- cardiology rx so he will update them with info Cholesterol has looked great- cardiology has not requested change   #hypertension S: medication: Compliant with Coreg 25 mg twice a day, Imdur 60 mg, Entresto, spironolactone 25 mg, Lasix 40 mg twice daily. Not lightheaded/dizzy BP Readings from Last 3 Encounters:  09/01/21 96/62  08/17/21 116/68  08/10/21 100/62  A/P: Controlled. Continue current medications.  - not lightheaded  or dizzy today- will not decrease meds as good for CHF  # Hypothyroidism S: Compliant with Synthroid 50 mcg daily before breakfast-most recently filled by Dr. Cruzita Lederer Lab Results  Component Value Date   TSH 2.036 12/27/2020   A/P:hopefully stable- update tsh today. Continue current meds for now   #Ulcerative colitis S: Followed by GI- compliant with mesalamine- new provider in [place] A/P: stable- continue current meds and GI follow up  #Diabetes-followed by Dr. Cruzita Lederer S: A1c was well controlled recently on metformin 500 mg twice daily   And Januvia 100 mg Lab Results  Component Value Date   HGBA1C 6.9 (A) 07/14/2021   HGBA1C 5.9 (H) 12/27/2020   HGBA1C 6.3 (A) 07/08/2020   A/P: doing well- continue follow up with endocrine  #morbid obesity with BMI over 35 and HTN/DM/HLD- advised healthy eating/regular exercise- goal long term is to get back to 200  Health Maintenance Due  Topic Date Due   COVID-19 Vaccine (4 - Booster for Moderna series)  Recommended at pharmacy 11/03/2020   OPHTHALMOLOGY EXAM - will get a copy  11/10/2020   Recommended follow up: Return in about 6 months (around 03/01/2022) for follow-up or sooner if needed. Future Appointments  Date Time Provider [place]  09/30/2021  9:45 AM [facility]-CCM CARE [facility] PEC  10/20/2021 11:00 AM Garvin Fila, MD GNA-GNA None  11/10/2021  3:00 PM Erenest Rasher, PA-C [facility]-RGA [facility]  11/12/2021  3:15 PM [facility] HEALTH COACH [facility] PEC  11/18/2021  4:00 PM Philemon Kingdom, MD [facility]PC-LBENDO None  03/31/2022  1:30 PM Rankin, Clent Demark, MD RDE-RDE None    Lab/Order associations:   ICD-10-CM   1. Essential hypertension  I10 CBC with Differential/Platelet    Comprehensive metabolic panel    2. Hyperlipidemia, unspecified hyperlipidemia type  E78.5 CBC with Differential/Platelet    Comprehensive metabolic panel    3. Type II diabetes mellitus with peripheral circulatory disorder (HCC)  E11.51     4. Ulcerative  pancolitis without complication ([place])  J49.70     5. Coronary artery disease involving native coronary artery of native heart without angina pectoris  I25.10     6. Permanent atrial fibrillation (HCC)  I48.21     7. Chronic systolic congestive heart failure, NYHA class 2 (HCC)  I50.22  8. Memory loss  R41.3     9. Hypothyroidism, unspecified type  E03.9 TSH      No orders of the defined types were placed in this encounter.  I,Jada Bradford,acting as a scribe for Garret Reddish, MD.,have documented all relevant documentation on the behalf of Garret Reddish, MD,as directed by  Garret Reddish, MD while in the presence of Garret Reddish, MD.  I, Garret Reddish, MD, have reviewed all documentation for this visit. The documentation on 09/01/21 for the exam, diagnosis, procedures, and orders are all accurate and complete.  Return precautions advised.  Garret Reddish, MD

## 2021-08-30 NOTE — Patient Instructions (Addendum)
Health Maintenance Due  Topic Date Due   COVID-19 Vaccine (4 - Booster for Moderna series)-  Please consider new bivalent booster shot at your local pharmacy. When you receive, please send [place] the date so we can have it in our system.   11/03/2020   OPHTHALMOLOGY EXAM - Sign release of information at the check out desk for recent diabetic eye exam. 11/10/2020   Check with your insurance to see if either Eliquis or Xarelto can be covered and let your cardiologist know.   Please stop by lab before you go If you have mychart- we will send your results within 3 business days of [place] receiving them.  If you do not have mychart- we will call you about results within 5 business days of [place] receiving them.  *please also note that you will see labs on mychart as soon as they post. I will later go in and write notes on them- will say "notes from Dr. Yong Channel"  Recommended follow up: Return in about 6 months (around 03/01/2022) for follow-up or sooner if needed.  Have a Happy Thanksgiving!

## 2021-09-01 ENCOUNTER — Other Ambulatory Visit: Payer: Self-pay

## 2021-09-01 ENCOUNTER — Encounter: Payer: Self-pay | Admitting: Family Medicine

## 2021-09-01 ENCOUNTER — Ambulatory Visit (INDEPENDENT_AMBULATORY_CARE_PROVIDER_SITE_OTHER): Payer: Medicare Other | Admitting: Family Medicine

## 2021-09-01 VITALS — BP 96/62 | HR 78 | Temp 98.2°F | Ht 68.0 in | Wt 232.2 lb

## 2021-09-01 DIAGNOSIS — R413 Other amnesia: Secondary | ICD-10-CM

## 2021-09-01 DIAGNOSIS — E039 Hypothyroidism, unspecified: Secondary | ICD-10-CM

## 2021-09-01 DIAGNOSIS — I4821 Permanent atrial fibrillation: Secondary | ICD-10-CM

## 2021-09-01 DIAGNOSIS — K5100 Ulcerative (chronic) pancolitis without complications: Secondary | ICD-10-CM

## 2021-09-01 DIAGNOSIS — I5022 Chronic systolic (congestive) heart failure: Secondary | ICD-10-CM

## 2021-09-01 DIAGNOSIS — I2510 Atherosclerotic heart disease of native coronary artery without angina pectoris: Secondary | ICD-10-CM | POA: Diagnosis not present

## 2021-09-01 DIAGNOSIS — I10 Essential (primary) hypertension: Secondary | ICD-10-CM | POA: Diagnosis not present

## 2021-09-01 DIAGNOSIS — E785 Hyperlipidemia, unspecified: Secondary | ICD-10-CM | POA: Diagnosis not present

## 2021-09-01 DIAGNOSIS — E1151 Type 2 diabetes mellitus with diabetic peripheral angiopathy without gangrene: Secondary | ICD-10-CM | POA: Diagnosis not present

## 2021-09-01 LAB — COMPREHENSIVE METABOLIC PANEL
ALT: 11 U/L (ref 0–53)
AST: 16 U/L (ref 0–37)
Albumin: 4.2 g/dL (ref 3.5–5.2)
Alkaline Phosphatase: 56 U/L (ref 39–117)
BUN: 25 mg/dL — ABNORMAL HIGH (ref 6–23)
CO2: 27 mEq/L (ref 19–32)
Calcium: 9 mg/dL (ref 8.4–10.5)
Chloride: 102 mEq/L (ref 96–112)
Creatinine, Ser: 1.43 mg/dL (ref 0.40–1.50)
GFR: 47.9 mL/min — ABNORMAL LOW (ref >60.00)
Glucose, Bld: 112 mg/dL — ABNORMAL HIGH (ref 70–99)
Potassium: 4.5 mEq/L (ref 3.5–5.1)
Sodium: 137 mEq/L (ref 135–145)
Total Bilirubin: 1 mg/dL (ref 0.2–1.2)
Total Protein: 6.7 g/dL (ref 6.0–8.3)

## 2021-09-01 LAB — CBC WITH DIFFERENTIAL/PLATELET
Basophils Absolute: 0 10*3/uL (ref 0.0–0.1)
Basophils Relative: 0.4 % (ref 0.0–3.0)
Eosinophils Absolute: 0.1 10*3/uL (ref 0.0–0.7)
Eosinophils Relative: 1 % (ref 0.0–5.0)
HCT: 47.1 % (ref 39.0–52.0)
Hemoglobin: 15.9 g/dL (ref 13.0–17.0)
Lymphocytes Relative: 12.3 % (ref 12.0–46.0)
Lymphs Abs: 0.8 10*3/uL (ref 0.7–4.0)
MCHC: 33.8 g/dL (ref 30.0–36.0)
MCV: 91.8 fl (ref 78.0–100.0)
Monocytes Absolute: 0.5 10*3/uL (ref 0.1–1.0)
Monocytes Relative: 7.8 % (ref 3.0–12.0)
Neutro Abs: 5.3 10*3/uL (ref 1.4–7.7)
Neutrophils Relative %: 78.5 % — ABNORMAL HIGH (ref 43.0–77.0)
Platelets: 124 10*3/uL — ABNORMAL LOW (ref 150.0–400.0)
RBC: 5.14 Mil/uL (ref 4.22–5.81)
RDW: 14.8 % (ref 11.5–15.5)
WBC: 6.7 10*3/uL (ref 4.0–10.5)

## 2021-09-01 LAB — TSH: TSH: 1.15 u[IU]/mL (ref 0.35–5.50)

## 2021-09-08 ENCOUNTER — Other Ambulatory Visit: Payer: Self-pay | Admitting: Cardiovascular Disease

## 2021-09-08 DIAGNOSIS — E1151 Type 2 diabetes mellitus with diabetic peripheral angiopathy without gangrene: Secondary | ICD-10-CM | POA: Diagnosis not present

## 2021-09-08 DIAGNOSIS — I110 Hypertensive heart disease with heart failure: Secondary | ICD-10-CM

## 2021-09-08 NOTE — Telephone Encounter (Signed)
Pt last saw Dr Johnsie Cancel 08/17/21, last labs 09/01/21 Creat 1.43, age 75, weight 105.3kg, CrCl 66.48, based on specified criteria pt is on appropriate dosage of Pradaxa 173m BID for afib.  Will refill rx.

## 2021-09-20 ENCOUNTER — Telehealth: Payer: Self-pay

## 2021-09-20 NOTE — Telephone Encounter (Signed)
Refill request received for Apriso 0.375 gm cap. To be sent to [facility].

## 2021-09-22 ENCOUNTER — Other Ambulatory Visit: Payer: Self-pay | Admitting: Gastroenterology

## 2021-09-22 DIAGNOSIS — K5100 Ulcerative (chronic) pancolitis without complications: Secondary | ICD-10-CM

## 2021-09-22 MED ORDER — APRISO 0.375 G PO CP24: 1.5000 g | ORAL_CAPSULE | Freq: Every day | ORAL | 5 refills | Status: DC

## 2021-09-22 NOTE — Telephone Encounter (Signed)
Rx sent 

## 2021-09-27 ENCOUNTER — Other Ambulatory Visit: Payer: Self-pay | Admitting: Physician Assistant

## 2021-09-27 MED ORDER — ENTRESTO 97-103 MG PO TABS: 1.0000 | ORAL_TABLET | Freq: Two times a day (BID) | ORAL | 3 refills | Status: DC

## 2021-09-30 ENCOUNTER — Ambulatory Visit (INDEPENDENT_AMBULATORY_CARE_PROVIDER_SITE_OTHER): Payer: Medicare Other | Admitting: *Deleted

## 2021-09-30 DIAGNOSIS — I5042 Chronic combined systolic (congestive) and diastolic (congestive) heart failure: Secondary | ICD-10-CM

## 2021-09-30 DIAGNOSIS — I10 Essential (primary) hypertension: Secondary | ICD-10-CM

## 2021-09-30 DIAGNOSIS — E1151 Type 2 diabetes mellitus with diabetic peripheral angiopathy without gangrene: Secondary | ICD-10-CM

## 2021-09-30 NOTE — Chronic Care Management (AMB) (Signed)
Chronic Care Management   CCM RN Visit Note  09/30/2021 Name: [name] MRN: [id number] DOB: Dec 24, 1945  Subjective: [name] is a 75 y.o. year old male who is a primary care patient of Yong Channel, Brayton Mars, MD. The care management team was consulted for assistance with disease management and care coordination needs.    Engaged with patient by telephone for follow up visit in response to provider referral for case management and/or care coordination services.   Consent to Services:  The patient was given information about Chronic Care Management services, agreed to services, and gave verbal consent prior to initiation of services.  Please see initial visit note for detailed documentation.   Patient agreed to services and verbal consent obtained.   Assessment: Review of patient past medical history, allergies, medications, health status, including review of consultants reports, laboratory and other test data, was performed as part of comprehensive evaluation and provision of chronic care management services.   SDOH (Social Determinants of Health) assessments and interventions performed:    CCM Care Plan  Allergies  Allergen Reactions   Clindamycin/Lincomycin     Developed C. difficile colitis 1 month after use    Outpatient Encounter Medications as of 09/30/2021  Medication Sig   acetaminophen (TYLENOL) 500 MG tablet Take 1,000 mg by mouth every 6 (six) hours as needed.   APRISO 0.375 g 24 hr capsule Take 4 capsules (1.5 g total) by mouth daily.   atorvastatin (LIPITOR) 20 MG tablet TAKE ONE TABLET (20MG TOTAL) BY MOUTH DAILY   carvedilol (COREG) 25 MG tablet TAKE ONE TABLET BY MOUTH TWICE A DAY   clopidogrel (PLAVIX) 75 MG tablet TAKE ONE TABLET (75MG TOTAL) BY MOUTH DAILY   Clotrimazole 1 % OINT Apply to penis twice daily after saline bath/rinse and pat dry   Coenzyme Q10 (COQ10 PO) Take 1 capsule by mouth daily with supper.   dabigatran (PRADAXA) 150 MG CAPS capsule  TAKE ONE CAPSULE (150MG TOTAL) BY MOUTH EVERY 12 HOURS   dicyclomine (BENTYL) 10 MG capsule TAKE ONE CAPSULE (10MG TOTAL) BY MOUTH TWO TIMES DAILY AS NEEDED FOR DIARRHEA OR ABDOMINAL CRAMPING   empagliflozin (JARDIANCE) 10 MG TABS tablet TAKE ONE (1) TABLET BY MOUTH EVERY DAY   fish oil-omega-3 fatty acids 1000 MG capsule Take 1 g by mouth daily with supper.   furosemide (LASIX) 40 MG tablet Take 1 tablet (40 mg total) by mouth 2 (two) times daily.   glucose blood test strip Use as instructed to test 4 time daily   isosorbide mononitrate (IMDUR) 60 MG 24 hr tablet TAKE ONE (1) TABLET BY MOUTH EVERY DAY   metFORMIN (GLUCOPHAGE) 500 MG tablet Take 1 tablet (500 mg total) by mouth 2 (two) times daily with a meal.   saccharomyces boulardii (FLORASTOR) 250 MG capsule Take 1 capsule (250 mg total) by mouth 2 (two) times daily.   sacubitril-valsartan (ENTRESTO) 97-103 MG Take 1 tablet by mouth 2 (two) times daily.   sitaGLIPtin (JANUVIA) 100 MG tablet TAKE ONE (1) TABLET BY MOUTH EVERY DAY   spironolactone (ALDACTONE) 25 MG tablet TAKE ONE (1) TABLET BY MOUTH EVERY DAY   SYNTHROID 50 MCG tablet TAKE ONE TABLET BY MOUTH DAILY BEFORE BREAKFAST   triamcinolone (KENALOG) 0.1 % Apply 1 application topically 2 (two) times daily. For 7-10 days maximum   vitamin B-12 (CYANOCOBALAMIN) 500 MCG tablet Take 1,000 mcg by mouth daily.   No facility-administered encounter medications on file as of 09/30/2021.  Patient Active Problem List   Diagnosis Date Noted   Posterior capsular opacification, left 06/30/2021   Lactose intolerance 01/06/2021   Fecal incontinence 01/06/2021   Acute on chronic respiratory failure with hypoxia ([place]) 12/27/2020   Atrial fibrillation with RVR (HCC)    Acute encephalopathy    Moderate nonproliferative diabetic retinopathy of both eyes ([place]) 01/16/2020   Atherosclerotic heart disease of native coronary artery with other forms of angina pectoris ([place]) 04/26/2019   C. difficile  colitis 04/17/2019   Dehydration 04/15/2019   Diarrhea 04/15/2019   ARF (acute renal failure) ([place]) 04/15/2019   Chronic combined systolic and diastolic CHF (congestive heart failure) ([place]) 04/15/2019   Hypokalemia 04/15/2019   AKI (acute kidney injury) ([place])    Acute on chronic combined systolic and diastolic heart failure ([place]) 02/07/2018   Former smoker 08/11/2016   MCI (mild cognitive impairment) 01/19/2016   History of transient ischemic attack (TIA) 10/24/2015   Primary osteoarthritis of left knee 02/09/2015   CHF (congestive heart failure), NYHA class II ([place]) 05/09/2014   Allergic rhinitis 06/25/2012   Ulcerative pancolitis without complication ([place]) 68/34/1962   GERD (gastroesophageal reflux disease) 08/09/2011   Morbid obesity ([place]) 08/09/2011   Atrial fibrillation ([place]) 12/08/2010   VITAMIN B12 DEFICIENCY 11/29/2010   Type II diabetes mellitus with peripheral circulatory disorder ([place]) 04/16/2007   Hyperlipidemia 04/16/2007   Hypothyroidism 04/12/2007   Essential hypertension 04/12/2007   CAD (coronary artery disease), native coronary artery 04/12/2007   Obstructive sleep apnea 04/12/2007    Conditions to be addressed/monitored:CHF, HTN, and DMII  Care Plan : [name] (Adult)  Updates made by Leona Singleton, RN since 09/30/2021 12:00 AM     Problem: Knowledge deficit related to self care management of chronic conditions   Priority: Medium     Long-Range Goal: Patient will work with chronic care management team to learn better self health management   Start Date: 08/19/2021  Expected End Date: 01/07/2022  This Visit's Progress: On track  Recent Progress: On track  Priority: Medium  Note:   Current Barriers:  Knowledge Deficits related to plan of care for management of CHF, HTN, and DMII  Chronic Disease Management support and education needs related to CHF, HTN, and DMII  states he feels great.  Denies any swelling, shortness of breath,  chest pains, light headiness, dizziness.  Weight this morning 229 pounds, blood sugar 113 with Ranges of 100-110's, blood pressure 118/78.  States he has had his Diabetic Eye exam.  RNCM Clinical Goal(s):  Patient will verbalize understanding of plan for management of CHF, HTN, and DMII as evidenced by daily weight monitoring, monitoring BP at least twice a week; checking blood sugars at least daily verbalize basic understanding of CHF, HTN, and DMII disease process and self health management plan as evidenced by less shortness of breath, no Emergency Room visits or hospitalizations in the next 90 days      take all medications exactly as prescribed and will call provider for medication related questions as evidenced by not skipping    continue to work with Consulting civil engineer and/or Social Worker to address care management and care coordination needs related to CHF, HTN, and DMII as evidenced by adherence to CM Team Scheduled appointments     through collaboration with RN Care manager, provider, and care team.  Patient will maintain Hgb A1C of <7   Interventions: 1:1 collaboration with primary care provider regarding development and update of comprehensive plan  of care as evidenced by provider attestation and co-signature Inter-disciplinary care team collaboration (see longitudinal plan of care) Evaluation of current treatment plan related to  self management and patient's adherence to plan as established by provider  Heart Failure Interventions:  (Status: Goal on Track (progressing): YES.)  Long Term Goal  Basic overview and discussion of pathophysiology of Heart Failure reviewed Reviewed Heart Failure Action Plan in depth and provided written copy Assessed need for readable accurate scales in home Advised patient to weigh each morning after emptying bladder Discussed importance of daily weight and advised patient to weigh and record daily Reviewed role of diuretics in prevention of fluid overload  and management of heart failure Discussed the importance of keeping all appointments with provider Discussed monitoring foods high in sodium with the upcoming holidays  Diabetes:  (Status: Goal on Track (progressing): YES.) Long Term Goal   Lab Results  Component Value Date   HGBA1C 6.9 (A) 07/14/2021  Assessed patient's understanding of A1c goal: <7% Provided education to patient about basic DM disease process; Reviewed medications with patient and discussed importance of medication adherence;        Reviewed prescribed diet with patient low salt,carbohydrate modified; Discussed plans with patient for ongoing care management follow up and provided patient with direct contact information for care management team;      Reviewed scheduled/upcoming provider appointments including: with Urology;         call provider for findings outside established parameters;         Hypertension: (Status: Goal on Track (progressing): YES.)  Long Term Last practice recorded BP readings:  BP Readings from Last 3 Encounters:  09/01/21 96/62  08/17/21 116/68  08/10/21 100/62  Most recent eGFR/CrCl: No results found for: EGFR  No components found for: CRCL  Evaluation of current treatment plan related to hypertension self management and patient's adherence to plan as established by provider;   Reviewed medications with patient and discussed importance of compliance;  Counseled on adverse effects of illicit drug and excessive alcohol use in patients with high blood pressure;  Counseled on the importance of exercise goals with target of 150 minutes per week Discussed plans with patient for ongoing care management follow up and provided patient with direct contact information for care management team; Discussed complications of poorly controlled blood pressure such as heart disease, stroke, circulatory complications, vision complications, kidney impairment, sexual dysfunction;   Advised to manage salt  intake  Patient Goals/Self-Care Activities: Take medications as prescribed   Attend all scheduled provider appointments Call provider office for new concerns or questions  call office if I gain more than 2 pounds in one day or 5 pounds in one week keep legs up while sitting track weight in diary use salt in moderation watch for swelling in feet, ankles and legs every day weigh myself daily develop a rescue plan follow rescue plan if symptoms flare-up track symptoms and what helps feel better or worse check blood sugar at prescribed times: once daily check feet daily for cuts, sores or redness enter blood sugar readings and medication or insulin into daily log take the blood sugar log to all doctor visits check blood pressure daily write blood pressure results in a log or diary keep a blood pressure log take blood pressure log to all doctor appointments call doctor for signs and symptoms of high blood pressure develop an action plan for high blood pressure       Plan:The care management team will reach  out to the patient again over the next 45 days.  Hubert Azure RN, MSN RN Care Management Coordinator  [place] [phone] [email or web address]

## 2021-09-30 NOTE — Patient Instructions (Addendum)
Visit Information  Thank you for taking time to visit with me today. Please don't hesitate to contact me if I can be of assistance to you before our next scheduled telephone appointment.  Following are the goals we discussed today:  Take medications as prescribed   Attend all scheduled provider appointments Call provider office for new concerns or questions  call office if I gain more than 2 pounds in one day or 5 pounds in one week keep legs up while sitting track weight in diary use salt in moderation watch for swelling in feet, ankles and legs every day weigh myself daily develop a rescue plan follow rescue plan if symptoms flare-up track symptoms and what helps feel better or worse check blood sugar at prescribed times: once daily check feet daily for cuts, sores or redness enter blood sugar readings and medication or insulin into daily log take the blood sugar log to all doctor visits check blood pressure daily write blood pressure results in a log or diary keep a blood pressure log take blood pressure log to all doctor appointments call doctor for signs and symptoms of high blood pressure develop an action plan for high blood pressure  Our next appointment is by telephone on 11/11/32 at [place]  Please call the care guide team at [phone] if you need to cancel or reschedule your appointment.   If you are experiencing a Mental Health or [place] or need someone to talk to, please call the Suicide and Crisis Lifeline: 988 call the [place] National Suicide Prevention Lifeline: (410)275-6864 or TTY: 414 227 2797 TTY 339-804-8221) to talk to a trained counselor call 1-800-273-TALK (toll free, 24 hour hotline) go to [facility] [address], [place] [phone]) call 911   Patient verbalizes understanding of instructions provided today and agrees to view in [place].   Hubert Azure RN, MSN RN Care Management  Coordinator  [facility] [phone] [email or web address]

## 2021-10-09 DIAGNOSIS — I110 Hypertensive heart disease with heart failure: Secondary | ICD-10-CM | POA: Diagnosis not present

## 2021-10-09 DIAGNOSIS — Z7984 Long term (current) use of oral hypoglycemic drugs: Secondary | ICD-10-CM

## 2021-10-09 DIAGNOSIS — I509 Heart failure, unspecified: Secondary | ICD-10-CM

## 2021-10-09 DIAGNOSIS — E1159 Type 2 diabetes mellitus with other circulatory complications: Secondary | ICD-10-CM

## 2021-10-20 ENCOUNTER — Ambulatory Visit: Payer: Medicare Other | Admitting: Neurology

## 2021-10-22 ENCOUNTER — Other Ambulatory Visit: Payer: Self-pay | Admitting: Gastroenterology

## 2021-10-22 DIAGNOSIS — A0472 Enterocolitis due to Clostridium difficile, not specified as recurrent: Secondary | ICD-10-CM

## 2021-10-22 DIAGNOSIS — R197 Diarrhea, unspecified: Secondary | ICD-10-CM

## 2021-10-22 DIAGNOSIS — K5100 Ulcerative (chronic) pancolitis without complications: Secondary | ICD-10-CM

## 2021-11-02 ENCOUNTER — Other Ambulatory Visit: Payer: Self-pay | Admitting: Cardiovascular Disease

## 2021-11-09 NOTE — Progress Notes (Signed)
Referring Provider: Marin Olp, MD Primary Care Physician:  Andrew Olp, MD Primary GI Physician: Dr. Gala [name]  Chief Complaint  Patient presents with   Diarrhea    Depends on what he eats, reports lactose intolerant.    HPI:   [name] is a 76 y.o. male with history of ulcerative pancolitis, C. difficile in July 2020 with recurrent symptoms requiring treatment with Dificid, postinfectious IBS thereafter treated with Bentyl and lactose free diet.  Last colonoscopy in 2012 with Dr. Sharlett [name] with findings consistent with universal UC s/p random biopsies, no active bleeding, polyps, masses; stated colitis was in remission on p.o. aminosalicylate's.  Pathology with mild chronic active colitis.  Recommended repeat colonoscopy in 5 years, but this has not been completed.  Last seen in our office March 2022.  He had recently been admitted earlier that month.  Presented with diaphoresis, dyspnea, altered mental status, HTN.  He became unresponsive requiring intubation in the EMS in route to the hospital.  [name] from [place] and required cardioversion.  He underwent treatment for encephalopathy, lactic acidosis, leukocytosis, all resolved.  Treated for acute on chronic respiratory failure with hypoxic pulmonary edema due to acute systolic CHF and was ultimately extubated on 3/21.  When he was seen in the office, he reported he was doing okay overall.  He was taking Apriso 1.5 g daily, Florastor daily which controls his loose stools.  Still with occasional loose stools depending on what he eats.  Using Bentyl prn. No alarm symptoms.  He had upcoming appointment with cardiology for follow-up.  Advised to continue current medications, use Lactaid tablets before dairy consumption, CMP, follow-up in 6 months.  Labs I ordered were not completed.  He has had blood work since that time, most recently in November 2022.  LFTs within normal limits, mild thrombocytopenia at 124 (present  since March 2022).  TSH within normal limits.  He has had follow-up with cardiology, most recently in November 2022.  This visit was actually a preop visit for upcoming circumcision.  Reported patient was low risk for surgery.  Stated it was okay for him to hold Plavix for 5 days and Plavix x2 days prior to procedure.  Volume status was stable and he was advised to continue his current medications.  Today:  Occasional diarrhea if he eats some ice cream/dairy products or sweet items. Otherwise, he does well with soft, formed stools on a daily basis. Will use bentyl as needed which works very well for him. No brbpr or melena. Weight is stable. No abdominal pain, nausea, vomiting, GERD symptoms, or dysphagia.   Doesn't want a colonoscopy unless he develops problems.   Has thrombocytopenia. No abdominal imaging on file. On plavix. Doesn't want to have an [place] to look at liver/spleen. Reports he will discuss with PCP next week at follow-up.   Past Medical History:  Diagnosis Date   B12 deficiency    per patient previously taking shots   C. difficile colitis 04/17/2019   C. difficile diarrhea    CAD (coronary artery disease), native coronary artery 04/12/2007   Chronic atrial fibrillation ([place])    initial diagnoses 2012   Chronic combined systolic and diastolic CHF (congestive heart failure) ([place]) 04/15/2019   CKD (chronic kidney disease), stage III ([place]) 04/15/2019   CORONARY ARTERY DISEASE 04/12/2007   2 stents last in 2006.    Cystoid macular edema of right eye 01/16/2020   Cystoid macular edema of right eye  01/16/2020   DIABETES MELLITUS, TYPE II 04/16/2007   Diverticulosis of colon (without mention of hemorrhage)    GERD (gastroesophageal reflux disease) 08/09/2011   Hyperlipidemia 04/16/2007   10/24 reveal study end. Atorvastatin 24m   HYPERTENSION 04/12/2007   HYPOTHYROIDISM 04/12/2007   Ischemic cardiomyopathy    Morbid obesity ([facility] 08/09/2011   MYOCARDIAL INFARCTION, HX OF 04/12/2007   Obstructive  sleep apnea 04/12/2007   02/2011 - AHI 96/h CPAP 13, Lg FF      Stroke (HCC)    Type II diabetes mellitus with peripheral circulatory disorder ([facility] 04/16/2007   Dr. GCruzita Lederermanages. Uses fructosamine.     ULCERATIVE COLITIS, LEFT SIDED 11/23/2010   Ulcerative pancolitis without complication ([facility] 167/20/9470  Ulcerative Colitis. Mesalamine.    VITAMIN B12 DEFICIENCY 11/29/2010    Past Surgical History:  Procedure Laterality Date   CARDIAC CATHETERIZATION  10/2002   STENT. 2 stents Dr. BMaurene Capes  CARDIAC CATHETERIZATION N/A 10/28/2015   Procedure: Right/Left Heart Cath and Coronary Angiography;  Surgeon: MSherren Mocha MD;  [facility];  Service: Cardiovascular;  Laterality: N/A;   CATARACT EXTRACTION     2021 bilateral eyes    COLONOSCOPY  2012   Dr. PSharlett [name] findings consistent with universal UC s/p random biopsies, no active bleeding, polyps, masses.  Pathology with mild chronic active colitis.  Repeat in 5 years.   CORONARY STENT PLACEMENT  2008   LAD    RIGHT/LEFT HEART CATH AND CORONARY ANGIOGRAPHY N/A 06/01/2018   Procedure: RIGHT/LEFT HEART CATH AND CORONARY ANGIOGRAPHY;  Surgeon: CSherren Mocha MD;  [facility];  Service: Cardiovascular;  Laterality: N/A;   TONSILLECTOMY      Current Outpatient Medications  Medication Sig Dispense Refill   acetaminophen (TYLENOL) 500 MG tablet Take 1,000 mg by mouth every 6 (six) hours as needed.     APRISO 0.375 g 24 hr capsule Take 4 capsules (1.5 g total) by mouth daily. 120 capsule 5   atorvastatin (LIPITOR) 20 MG tablet TAKE ONE TABLET (20MG TOTAL) BY MOUTH DAILY 30 tablet 5   carvedilol (COREG) 25 MG tablet TAKE ONE TABLET BY MOUTH TWICE A DAY 180 tablet 3   clopidogrel (PLAVIX) 75 MG tablet TAKE ONE TABLET (75MG TOTAL) BY MOUTH DAILY 90 tablet 3   Clotrimazole 1 % OINT Apply to penis twice daily after saline bath/rinse and pat dry 56.7 g 0   Coenzyme Q10 (COQ10 PO) Take 1 capsule by mouth daily with  supper.     dabigatran (PRADAXA) 150 MG CAPS capsule TAKE ONE CAPSULE (150MG TOTAL) BY MOUTH EVERY 12 HOURS 60 capsule 6   dicyclomine (BENTYL) 10 MG capsule TAKE ONE CAPSULE (10MG TOTAL) BY MOUTH TWO TIMES DAILY AS NEEDED FOR DIARRHEA OR ABDOMINAL CRAMPING 90 capsule 3   empagliflozin (JARDIANCE) 10 MG TABS tablet TAKE ONE (1) TABLET BY MOUTH EVERY DAY 90 tablet 3   fish oil-omega-3 fatty acids 1000 MG capsule Take 1 g by mouth daily with supper.     furosemide (LASIX) 40 MG tablet Take 1 tablet (40 mg total) by mouth 2 (two) times daily. 180 tablet 3   isosorbide mononitrate (IMDUR) 60 MG 24 hr tablet TAKE ONE (1) TABLET BY MOUTH EVERY DAY 90 tablet 3   metFORMIN (GLUCOPHAGE) 500 MG tablet Take 1 tablet (500 mg total) by mouth 2 (two) times daily with a meal. 180 tablet 3   saccharomyces boulardii (FLORASTOR) 250 MG capsule Take 1 capsule (250 mg total) by  mouth 2 (two) times daily.     sacubitril-valsartan (ENTRESTO) 97-103 MG Take 1 tablet by mouth 2 (two) times daily. 180 tablet 3   sitaGLIPtin (JANUVIA) 100 MG tablet TAKE ONE (1) TABLET BY MOUTH EVERY DAY 90 tablet 3   spironolactone (ALDACTONE) 25 MG tablet TAKE ONE (1) TABLET BY MOUTH EVERY DAY 90 tablet 3   SYNTHROID 50 MCG tablet TAKE ONE TABLET BY MOUTH DAILY BEFORE BREAKFAST 90 tablet 3   triamcinolone (KENALOG) 0.1 % Apply 1 application topically 2 (two) times daily. For 7-10 days maximum 80 g 0   vitamin B-12 (CYANOCOBALAMIN) 500 MCG tablet Take 1,000 mcg by mouth daily.     glucose blood test strip Use as instructed to test 4 time daily 400 each 3   No current facility-administered medications for this visit.    Allergies as of 11/10/2021 - Review Complete 11/10/2021  Allergen Reaction Noted   Clindamycin/lincomycin  04/26/2019    Family History  Problem Relation Age of Onset   Heart attack Mother        mid 28s   Heart disease Father        H/O CAD, CABG, VALVE SURGERY   Hypertension Brother    Obesity Brother     Stomach cancer Maternal Aunt    Stomach cancer Paternal Grandmother        ? colon    Colon cancer Neg Hx     Social History   Socioeconomic History   Marital status: Married    Spouse name: Not on file   Number of children: 1   Years of education: Not on file   Highest education level: Not on file  Occupational History   Occupation: RETIRED    Employer: RETIRED  Tobacco Use   Smoking status: Former    Packs/day: 1.00    Years: 5.00    Pack years: 5.00    Types: Cigarettes    Quit date: 04/13/1966    Years since quitting: 55.6   Smokeless tobacco: Never  Vaping Use   Vaping Use: Never used  Substance and Sexual Activity   Alcohol use: No    Alcohol/week: 0.0 standard drinks    Comment: no more beer   Drug use: No   Sexual activity: Not on file  Other Topics Concern   Not on file  Social History Narrative   Cardiorehab 3 days a week. 45 minutes to an hour-stationary bike.    GRANDDAUGHTER (MS. 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       Married for 37 years in 2015, married previously for 14 years. Daughter with first wife and 3 grandkids.    Lives alone with wife. Get to see grandkids a lot. New grandchild in middle of 3      Hobbies-yardwork, previously liked to hunt and fish, does some target shooting   Social Determinants of Health   Financial Resource Strain: Low Risk    Difficulty of Paying Living Expenses: Not hard at all  Food Insecurity: No Food Insecurity   Worried About Charity fundraiser in the Last Year: Never true   Arboriculturist in the Last Year: Never true  Transportation Needs: No Transportation Needs   Lack of Transportation (Medical): No   Lack of Transportation (Non-Medical): No  Physical Activity: Not on file  Stress: Not on file  Social Connections: Not on file    Review of Systems: Gen: Denies fever, chills, cold or flulike  symptoms, presyncope, syncope. CV: Denies chest pain, palpitations. Resp: Denies dyspnea or cough. GI: See HPI Derm: Denies rash.  Heme: See HPI  Physical Exam: BP 123/80    Pulse 67    Temp (!) 96.9 F (36.1 C)    Ht 5' 8"  (1.727 m)    Wt 229 lb (103.9 kg)    BMI 34.82 kg/m  General:   Alert and oriented. No distress noted. Pleasant and cooperative.  Head:  Normocephalic and atraumatic. Eyes:  Conjuctiva clear without scleral icterus. Heart:  S1, S2 present without murmurs appreciated. Lungs:  Clear to auscultation bilaterally. No wheezes, rales, or rhonchi. No distress.  Abdomen:  +BS, soft, non-tender and non-distended. No rebound or guarding. No HSM or masses noted. Msk:  Symmetrical without gross deformities. Normal posture. Extremities:  Without edema. Neurologic:  Alert and  oriented x4 Psych:  Normal mood and affect.    Assessment:  76 year old male with long history of ulcerative colitis maintained on Apriso 1.5 g daily, lactose intolerance, presenting today for routine follow-up.  Clinically, he is doing very well with Apriso 1.5 g daily and following a lactose-free diet.  Occasionally, he has breakthrough diarrhea if he consumes dairy products or sweets, otherwise he has soft, formed bowel movements without BRBPR, melena, or abdominal pain.  Weight has remained stable.  No other significant GI symptoms.  Recent labs on file from November 2022 with creatinine and LFTs within normal limits.  Notably, he is well overdue for colonoscopy.  His last colonoscopy was in 2012 with Dr. Sharlett [name] with findings consistent with ulcerative colitis s/p random biopsies which revealed mild chronic active colitis.  He was advised to repeat a colonoscopy in 5 years, but this has not been completed.  Patient states he is not interested in having a colonoscopy unless he develops problems.  We discussed that he is at increased risk for colon cancer in the setting of UC, but he still declined.   He  will continue his current medications, lactose-free diet, and follow-up in 6 months.  Advised he may try Lactaid tablets prior to dairy consumption.  Thrombocytopenia: Dates back to March 2022 with platelets as low as 98.  Most recently in November 2022, platelets 124.  He is chronically on Plavix.  No known history of liver disease though no imaging on file.  Discussed arranging an ultrasound to look at his liver and spleen, but patient declined.  Reports he has follow-up with PCP next week and will discuss with him at that time.   Plan:  Continue Apriso 1.5 g daily. Continue lactose-free diet. May try Lactaid tablets prior to dairy consumption. Continue Bentyl as needed. Follow-up in 6 months or sooner if needed.   Aliene Altes, PA-C [facility] 11/10/2021

## 2021-11-10 ENCOUNTER — Encounter: Payer: Self-pay | Admitting: Gastroenterology

## 2021-11-10 ENCOUNTER — Other Ambulatory Visit: Payer: Self-pay

## 2021-11-10 ENCOUNTER — Ambulatory Visit (INDEPENDENT_AMBULATORY_CARE_PROVIDER_SITE_OTHER): Payer: Medicare Other | Admitting: Gastroenterology

## 2021-11-10 VITALS — BP 123/80 | HR 67 | Temp 96.9°F | Ht 68.0 in | Wt 229.0 lb

## 2021-11-10 DIAGNOSIS — R197 Diarrhea, unspecified: Secondary | ICD-10-CM

## 2021-11-10 DIAGNOSIS — E739 Lactose intolerance, unspecified: Secondary | ICD-10-CM | POA: Diagnosis not present

## 2021-11-10 DIAGNOSIS — K5100 Ulcerative (chronic) pancolitis without complications: Secondary | ICD-10-CM | POA: Diagnosis not present

## 2021-11-10 DIAGNOSIS — D696 Thrombocytopenia, unspecified: Secondary | ICD-10-CM | POA: Insufficient documentation

## 2021-11-10 NOTE — Patient Instructions (Signed)
Continue taking Apriso 4 capsules (1.5 g total) daily for ulcerative colitis.  Continue following a lactose-free diet.  If you are going to consume dairy products, try taking Lactaid tablets prior.  You may continue to use dicyclomine as needed for diarrhea.  We will follow-up with you in about 6 months.  Do not hesitate to call if you have questions or concerns prior to next visit.  It was a pleasure meeting you today!  I am glad you are doing well!  Aliene Altes, PA-C [facility]

## 2021-11-11 ENCOUNTER — Telehealth: Payer: Medicare Other

## 2021-11-12 ENCOUNTER — Other Ambulatory Visit: Payer: Self-pay

## 2021-11-12 ENCOUNTER — Ambulatory Visit (INDEPENDENT_AMBULATORY_CARE_PROVIDER_SITE_OTHER): Payer: Medicare Other

## 2021-11-12 DIAGNOSIS — Z0000 Encounter for general adult medical examination without abnormal findings: Secondary | ICD-10-CM | POA: Diagnosis not present

## 2021-11-12 NOTE — Patient Instructions (Signed)
[name] , Thank you for taking time to come for your Medicare Wellness Visit. I appreciate your ongoing commitment to your health goals. Please review the following plan we discussed and let me know if I can assist you in the future.   Screening recommendations/referrals: Colonoscopy: no longer required Recommended yearly ophthalmology/optometry visit for glaucoma screening and checkup Recommended yearly dental visit for hygiene and checkup  Vaccinations: Influenza vaccine: Done 07/14/21 Pneumococcal vaccine: Up to date Tdap vaccine: postponed 08/10/22 Shingles vaccine: Shingrix discussed. Please contact your pharmacy for coverage information.    Covid-19: Completed 3/25, 4/27, 09/08/20  Advanced directives: Please bring a copy of your health care power of attorney and living will to the office at your convenience.  Conditions/risks identified: Stay active and live long   Next appointment: Follow up in one year for your annual wellness visit.   Preventive Care 16 Years and Older, Male Preventive care refers to lifestyle choices and visits with your health care provider that can promote health and wellness. What does preventive care include? A yearly physical exam. This is also called an annual well check. Dental exams once or twice a year. Routine eye exams. Ask your health care provider how often you should have your eyes checked. Personal lifestyle choices, including: Daily care of your teeth and gums. Regular physical activity. Eating a healthy diet. Avoiding tobacco and drug use. Limiting alcohol use. Practicing safe sex. Taking low doses of aspirin every day. Taking vitamin and mineral supplements as recommended by your health care provider. What happens during an annual well check? The services and screenings done by your health care provider during your annual well check will depend on your age, overall health, lifestyle risk factors, and family history of  disease. Counseling  Your health care provider may ask you questions about your: Alcohol use. Tobacco use. Drug use. Emotional well-being. Home and relationship well-being. Sexual activity. Eating habits. History of falls. Memory and ability to understand (cognition). Work and work Statistician. Screening  You may have the following tests or measurements: Height, weight, and BMI. Blood pressure. Lipid and cholesterol levels. These may be checked every 5 years, or more frequently if you are over 60 years old. Skin check. Lung cancer screening. You may have this screening every year starting at age 63 if you have a 30-pack-year history of smoking and currently smoke or have quit within the past 15 years. Fecal occult blood test (FOBT) of the stool. You may have this test every year starting at age 8. Flexible sigmoidoscopy or colonoscopy. You may have a sigmoidoscopy every 5 years or a colonoscopy every 10 years starting at age 58. Prostate cancer screening. Recommendations will vary depending on your family history and other risks. Hepatitis C blood test. Hepatitis B blood test. Sexually transmitted disease (STD) testing. Diabetes screening. This is done by checking your blood sugar (glucose) after you have not eaten for a while (fasting). You may have this done every 1-3 years. Abdominal aortic aneurysm (AAA) screening. You may need this if you are a current or former smoker. Osteoporosis. You may be screened starting at age 31 if you are at high risk. Talk with your health care provider about your test results, treatment options, and if necessary, the need for more tests. Vaccines  Your health care provider may recommend certain vaccines, such as: Influenza vaccine. This is recommended every year. Tetanus, diphtheria, and acellular pertussis (Tdap, Td) vaccine. You may need a Td booster every 10 years. Zoster vaccine.  You may need this after age 79. Pneumococcal 13-valent  conjugate (PCV13) vaccine. One dose is recommended after age 43. Pneumococcal polysaccharide (PPSV23) vaccine. One dose is recommended after age 30. Talk to your health care provider about which screenings and vaccines you need and how often you need them. This information is not intended to replace advice given to you by your health care provider. Make sure you discuss any questions you have with your health care provider. Document Released: 10/23/2015 Document Revised: 06/15/2016 Document Reviewed: 07/28/2015 Elsevier Interactive Patient Education  2017 [place] Prevention in the Home Falls can cause injuries. They can happen to people of all ages. There are many things you can do to make your home safe and to help prevent falls. What can I do on the outside of my home? Regularly fix the edges of walkways and driveways and fix any cracks. Remove anything that might make you trip as you walk through a door, such as a raised step or threshold. Trim any bushes or trees on the path to your home. Use bright outdoor lighting. Clear any walking paths of anything that might make someone trip, such as rocks or tools. Regularly check to see if handrails are loose or broken. Make sure that both sides of any steps have handrails. Any raised decks and porches should have guardrails on the edges. Have any leaves, snow, or ice cleared regularly. Use sand or salt on walking paths during winter. Clean up any spills in your garage right away. This includes oil or grease spills. What can I do in the bathroom? Use night lights. Install grab bars by the toilet and in the tub and shower. Do not use towel bars as grab bars. Use non-skid mats or decals in the tub or shower. If you need to sit down in the shower, use a plastic, non-slip stool. Keep the floor dry. Clean up any water that spills on the floor as soon as it happens. Remove soap buildup in the tub or shower regularly. Attach bath mats  securely with double-sided non-slip rug tape. Do not have throw rugs and other things on the floor that can make you trip. What can I do in the bedroom? Use night lights. Make sure that you have a light by your bed that is easy to reach. Do not use any sheets or blankets that are too big for your bed. They should not hang down onto the floor. Have a firm chair that has side arms. You can use this for support while you get dressed. Do not have throw rugs and other things on the floor that can make you trip. What can I do in the kitchen? Clean up any spills right away. Avoid walking on wet floors. Keep items that you use a lot in easy-to-reach places. If you need to reach something above you, use a strong step stool that has a grab bar. Keep electrical cords out of the way. Do not use floor polish or wax that makes floors slippery. If you must use wax, use non-skid floor wax. Do not have throw rugs and other things on the floor that can make you trip. What can I do with my stairs? Do not leave any items on the stairs. Make sure that there are handrails on both sides of the stairs and use them. Fix handrails that are broken or loose. Make sure that handrails are as long as the stairways. Check any carpeting to make sure that it is  firmly attached to the stairs. Fix any carpet that is loose or worn. Avoid having throw rugs at the top or bottom of the stairs. If you do have throw rugs, attach them to the floor with carpet tape. Make sure that you have a light switch at the top of the stairs and the bottom of the stairs. If you do not have them, ask someone to add them for you. What else can I do to help prevent falls? Wear shoes that: Do not have high heels. Have rubber bottoms. Are comfortable and fit you well. Are closed at the toe. Do not wear sandals. If you use a stepladder: Make sure that it is fully opened. Do not climb a closed stepladder. Make sure that both sides of the stepladder  are locked into place. Ask someone to hold it for you, if possible. Clearly mark and make sure that you can see: Any grab bars or handrails. First and last steps. Where the edge of each step is. Use tools that help you move around (mobility aids) if they are needed. These include: Canes. Walkers. Scooters. Crutches. Turn on the lights when you go into a dark area. Replace any light bulbs as soon as they burn out. Set up your furniture so you have a clear path. Avoid moving your furniture around. If any of your floors are uneven, fix them. If there are any pets around you, be aware of where they are. Review your medicines with your doctor. Some medicines can make you feel dizzy. This can increase your chance of falling. Ask your doctor what other things that you can do to help prevent falls. This information is not intended to replace advice given to you by your health care provider. Make sure you discuss any questions you have with your health care provider. Document Released: 07/23/2009 Document Revised: 03/03/2016 Document Reviewed: 10/31/2014 Elsevier Interactive Patient Education  2017 [employer].

## 2021-11-12 NOTE — Progress Notes (Addendum)
Virtual Visit via Telephone Note  I connected with  [name] on 11/12/21 at  3:15 PM EST by telephone and verified that I am speaking with the correct person using two identifiers.  Medicare Annual Wellness visit completed telephonically due to Covid-19 pandemic.   Persons participating in this call: This Health Coach and this patient.   Location: Patient: Home Provider: Office    I discussed the limitations, risks, security and privacy concerns of performing an evaluation and management service by telephone and the availability of in person appointments. The patient expressed understanding and agreed to proceed.  Unable to perform video visit due to video visit attempted and failed and/or patient does not have video capability.   Some vital signs may be absent or patient reported.   Willette Brace, LPN   Subjective:   [name] is a 76 y.o. male who presents for Medicare Annual/Subsequent preventive examination.  Review of Systems     Cardiac Risk Factors include: advanced age (>95mn, >>76women);diabetes mellitus;dyslipidemia;male gender;hypertension;obesity (BMI >30kg/m2)     Objective:    There were no vitals filed for this visit. There is no height or weight on file to calculate BMI.  Advanced Directives 11/12/2021 02/02/2021 12/30/2020 12/29/2020 11/06/2020 08/26/2019 04/15/2019  Does Patient Have a Medical Advance Directive? Yes Yes - No Yes Yes No  Type of Advance Directive Healthcare Power of Attorney Living will -PrattLiving will Living will;Healthcare Power of Attorney -  Does patient want to make changes to medical advance directive? - No - Patient declined - - - No - Patient declined -  Copy of HForestin Chart? No - copy requested - - - No - copy requested No - copy requested -  Would patient like information on creating a medical advance directive? - No - Patient declined No - Patient declined - - - No -  Patient declined    Current Medications (verified) Outpatient Encounter Medications as of 11/12/2021  Medication Sig   APRISO 0.375 g 24 hr capsule Take 4 capsules (1.5 g total) by mouth daily.   atorvastatin (LIPITOR) 20 MG tablet TAKE ONE TABLET (20MG TOTAL) BY MOUTH DAILY   carvedilol (COREG) 25 MG tablet TAKE ONE TABLET BY MOUTH TWICE A DAY   clopidogrel (PLAVIX) 75 MG tablet TAKE ONE TABLET (75MG TOTAL) BY MOUTH DAILY   Clotrimazole 1 % OINT Apply to penis twice daily after saline bath/rinse and pat dry   Coenzyme Q10 (COQ10 PO) Take 1 capsule by mouth daily with supper.   dabigatran (PRADAXA) 150 MG CAPS capsule TAKE ONE CAPSULE (150MG TOTAL) BY MOUTH EVERY 12 HOURS   dicyclomine (BENTYL) 10 MG capsule TAKE ONE CAPSULE (10MG TOTAL) BY MOUTH TWO TIMES DAILY AS NEEDED FOR DIARRHEA OR ABDOMINAL CRAMPING   empagliflozin (JARDIANCE) 10 MG TABS tablet TAKE ONE (1) TABLET BY MOUTH EVERY DAY   fish oil-omega-3 fatty acids 1000 MG capsule Take 1 g by mouth daily with supper.   furosemide (LASIX) 40 MG tablet Take 1 tablet (40 mg total) by mouth 2 (two) times daily.   glucose blood test strip Use as instructed to test 4 time daily   isosorbide mononitrate (IMDUR) 60 MG 24 hr tablet TAKE ONE (1) TABLET BY MOUTH EVERY DAY   metFORMIN (GLUCOPHAGE) 500 MG tablet Take 1 tablet (500 mg total) by mouth 2 (two) times daily with a meal.   saccharomyces boulardii (FLORASTOR) 250 MG capsule Take 1 capsule (  250 mg total) by mouth 2 (two) times daily.   sacubitril-valsartan (ENTRESTO) 97-103 MG Take 1 tablet by mouth 2 (two) times daily.   sitaGLIPtin (JANUVIA) 100 MG tablet TAKE ONE (1) TABLET BY MOUTH EVERY DAY   spironolactone (ALDACTONE) 25 MG tablet TAKE ONE (1) TABLET BY MOUTH EVERY DAY   SYNTHROID 50 MCG tablet TAKE ONE TABLET BY MOUTH DAILY BEFORE BREAKFAST   triamcinolone (KENALOG) 0.1 % Apply 1 application topically 2 (two) times daily. For 7-10 days maximum   vitamin B-12 (CYANOCOBALAMIN) 500 MCG  tablet Take 1,000 mcg by mouth daily.   acetaminophen (TYLENOL) 500 MG tablet Take 1,000 mg by mouth every 6 (six) hours as needed. (Patient not taking: Reported on 11/12/2021)   No facility-administered encounter medications on file as of 11/12/2021.    Allergies (verified) Clindamycin/lincomycin   History: Past Medical History:  Diagnosis Date   B12 deficiency    per patient previously taking shots   C. difficile colitis 04/17/2019   C. difficile diarrhea    CAD (coronary artery disease), native coronary artery 04/12/2007   Chronic atrial fibrillation ([place])    initial diagnoses 2012   Chronic combined systolic and diastolic CHF (congestive heart failure) ([place]) 04/15/2019   CKD (chronic kidney disease), stage III ([place]) 04/15/2019   CORONARY ARTERY DISEASE 04/12/2007   2 stents last in 2006.    Cystoid macular edema of right eye 01/16/2020   Cystoid macular edema of right eye 01/16/2020   DIABETES MELLITUS, TYPE II 04/16/2007   Diverticulosis of colon (without mention of hemorrhage)    GERD (gastroesophageal reflux disease) 08/09/2011   Hyperlipidemia 04/16/2007   10/24 reveal study end. Atorvastatin 36m   HYPERTENSION 04/12/2007   HYPOTHYROIDISM 04/12/2007   Ischemic cardiomyopathy    Morbid obesity ([facility] 08/09/2011   MYOCARDIAL INFARCTION, HX OF 04/12/2007   Obstructive sleep apnea 04/12/2007   02/2011 - AHI 96/h CPAP 13, Lg FF      Stroke (HCC)    Type II diabetes mellitus with peripheral circulatory disorder ([place] 04/16/2007   Dr. GCruzita Lederermanages. Uses fructosamine.     ULCERATIVE COLITIS, LEFT SIDED 11/23/2010   Ulcerative pancolitis without complication ([place] 189/37/3428  Ulcerative Colitis. Mesalamine.    VITAMIN B12 DEFICIENCY 11/29/2010   Past Surgical History:  Procedure Laterality Date   CARDIAC CATHETERIZATION  10/2002   STENT. 2 stents Dr. BMaurene Capes  CARDIAC CATHETERIZATION N/A 10/28/2015   Procedure: Right/Left Heart Cath and Coronary Angiography;  Surgeon: MSherren Mocha MD;   [facility];  Service: Cardiovascular;  Laterality: N/A;   CATARACT EXTRACTION     2021 bilateral eyes    COLONOSCOPY  2012   Dr. PSharlett Iles findings consistent with universal UC s/p random biopsies, no active bleeding, polyps, masses.  Pathology with mild chronic active colitis.  Repeat in 5 years.   CORONARY STENT PLACEMENT  2008   LAD    RIGHT/LEFT HEART CATH AND CORONARY ANGIOGRAPHY N/A 06/01/2018   Procedure: RIGHT/LEFT HEART CATH AND CORONARY ANGIOGRAPHY;  Surgeon: CSherren Mocha MD;  [facility];  Service: Cardiovascular;  Laterality: N/A;   TONSILLECTOMY     Family History  Problem Relation Age of Onset   Heart attack Mother        mid 718s  Heart disease Father        H/O CAD, CABG, VALVE SURGERY   Hypertension Brother    Obesity Brother    Stomach cancer Maternal Aunt    Stomach  cancer Paternal Grandmother        ? colon    Colon cancer Neg Hx    Social History   Socioeconomic History   Marital status: Married    Spouse name: Not on file   Number of children: 1   Years of education: Not on file   Highest education level: Not on file  Occupational History   Occupation: RETIRED    Employer: RETIRED  Tobacco Use   Smoking status: Former    Packs/day: 1.00    Years: 5.00    Pack years: 5.00    Types: Cigarettes    Quit date: 04/13/1966    Years since quitting: 55.6   Smokeless tobacco: Never  Vaping Use   Vaping Use: Never used  Substance and Sexual Activity   Alcohol use: No    Alcohol/week: 0.0 standard drinks    Comment: no more beer   Drug use: No   Sexual activity: Not on file  Other Topics Concern   Not on file  Social History Narrative   Cardiorehab 3 days a week. 45 minutes to an hour-stationary bike.    GRANDDAUGHTER (MS. 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       Married for 37 years in 2015, married  previously for 14 years. Daughter with first wife and 3 grandkids.    Lives alone with wife. Get to see grandkids a lot. New grandchild in middle of 79      Hobbies-yardwork, previously liked to hunt and fish, does some target shooting   Social Determinants of Health   Financial Resource Strain: Low Risk    Difficulty of Paying Living Expenses: Not hard at all  Food Insecurity: No Food Insecurity   Worried About Charity fundraiser in the Last Year: Never true   Arboriculturist in the Last Year: Never true  Transportation Needs: No Transportation Needs   Lack of Transportation (Medical): No   Lack of Transportation (Non-Medical): No  Physical Activity: Inactive   Days of Exercise per Week: 0 days   Minutes of Exercise per Session: 0 min  Stress: No Stress Concern Present   Feeling of Stress : Not at all  Social Connections: Socially Integrated   Frequency of Communication with Friends and Family: More than three times a week   Frequency of Social Gatherings with Friends and Family: More than three times a week   Attends Religious Services: More than 4 times per year   Active Member of [employer] or Organizations: Yes   Attends Archivist Meetings: 1 to 4 times per year   Marital Status: Married    Tobacco Counseling Counseling given: Not Answered   Clinical Intake:  Pre-visit preparation completed: Yes  Pain : No/denies pain     BMI - recorded: 34.82 Nutritional Status: BMI > 30  Obese Nutritional Risks: None Diabetes: Yes CBG done?: Yes (109 pt stated) CBG resulted in Enter/ Edit results?: No Did pt. bring in CBG monitor from home?: No  How often do you need to have someone help you when you read instructions, pamphlets, or other written materials from your doctor or pharmacy?: 1 - Never  Diabetic?Nutrition Risk Assessment:  Has the patient had any N/V/D within the last 2 months?  No  Does the patient have any non-healing wounds?  No  Has the  patient had  any unintentional weight loss or weight gain?  No   Diabetes:  Is the patient diabetic?  Yes  If diabetic, was a CBG obtained today?  Yes  Did the patient bring in their glucometer from home?  No  How often do you monitor your CBG's? Daily.   Financial Strains and Diabetes Management:  Are you having any financial strains with the device, your supplies or your medication? No .  Does the patient want to be seen by Chronic Care Management for management of their diabetes?  No  Would the patient like to be referred to a Nutritionist or for Diabetic Management?  No   Diabetic Exams:  Diabetic Eye Exam: Overdue for diabetic eye exam. Pt has been advised about the importance in completing this exam. Patient advised to call and schedule an eye exam. Diabetic Foot Exam: Completed 01/13/21   Interpreter Needed?: No  Information entered by :: Charlott Rakes, LPN   Activities of Daily Living In your present state of health, do you have any difficulty performing the following activities: 11/12/2021 12/29/2020  Hearing? Y N  Vision? N N  Difficulty concentrating or making decisions? N N  Walking or climbing stairs? N N  Dressing or bathing? N N  Doing errands, shopping? N N  Preparing Food and eating ? N -  Using the Toilet? N -  In the past six months, have you accidently leaked urine? N -  Do you have problems with loss of bowel control? N -  Managing your Medications? N -  Managing your Finances? N -  Housekeeping or managing your Housekeeping? N -  Some recent data might be hidden    Patient Care Team: Marin Olp, MD as PCP - General (Family Medicine) Sherren Mocha, MD as PCP - Cardiology (Cardiology) Thompson Grayer, MD as PCP - Electrophysiology (Cardiology) Bensimhon, Shaune Pascal, MD as PCP - Advanced Heart Failure (Cardiology) Rutherford Guys, MD as Consulting Physician (Ophthalmology) Philemon Kingdom, MD as Consulting Physician (Endocrinology) Ladene Artist, MD as  Consulting Physician (Gastroenterology) Gala Romney Cristopher Estimable, MD as Consulting Physician (Gastroenterology) Gala Romney Cristopher Estimable, MD as Consulting Physician (Gastroenterology) Leona Singleton, RN as Case Manager  Indicate any recent Medical Services you may have received from other than [facility] providers in the past year (date may be approximate).     Assessment:   This is a routine wellness examination for [facility].  Hearing/Vision screen Hearing Screening - Comments:: Pt stated ringing in ears at times  Vision Screening - Comments:: Pt follows up with Dr Zadie Rhine for annual eye exams   Dietary issues and exercise activities discussed: Current Exercise Habits: The patient does not participate in regular exercise at present   Goals Addressed             This Visit's Progress    Patient Stated       Stay active and alive        Depression Screen PHQ 2/9 Scores 11/12/2021 09/01/2021 02/02/2021 01/13/2021 11/06/2020 09/02/2020 08/26/2019  PHQ - 2 Score 0 0 0 0 0 0 0    Fall Risk Fall Risk  11/12/2021 09/30/2021 09/01/2021 03/03/2021 02/02/2021  Falls in the past year? 0 0 0 0 0  Number falls in past yr: 0 0 0 0 0  Injury with Fall? 0 0 0 0 0  Risk for fall due to : Impaired vision Impaired vision No Fall Risks - Medication side effect;Impaired vision  Follow up Falls prevention discussed Falls  evaluation completed;Education provided;Falls prevention discussed - - Falls prevention discussed;Education provided;Falls evaluation completed    FALL RISK PREVENTION PERTAINING TO THE HOME:  Any stairs in or around the home? Yes  If so, are there any without handrails? No  Home free of loose throw rugs in walkways, pet beds, electrical cords, etc? Yes  Adequate lighting in your home to reduce risk of falls? Yes   ASSISTIVE DEVICES UTILIZED TO PREVENT FALLS:  Life alert? No  Use of a cane, walker or w/c? No  Grab bars in the bathroom? Yes  Shower chair or bench in shower? Yes  Elevated toilet seat  or a handicapped toilet? Yes   TIMED UP AND GO:  Was the test performed? No .   Cognitive Function: MMSE - Mini Mental State Exam 03/03/2021 01/25/2018  Not completed: - (No Data)  Orientation to time 5 -  Orientation to Place 5 -  Registration 3 -  Attention/ Calculation 5 -  Recall 3 -  Language- name 2 objects 2 -  Language- repeat 1 -  Language- follow 3 step command 3 -  Language- read & follow direction 1 -  Write a sentence 1 -  Copy design 1 -  Total score 30 -     6CIT Screen 11/12/2021 11/06/2020 08/26/2019  What Year? 0 points 0 points 0 points  What month? 0 points 0 points 0 points  What time? 0 points - 0 points  Count back from 20 0 points 0 points 0 points  Months in reverse 0 points 0 points 0 points  Repeat phrase 0 points 2 points 0 points  Total Score 0 - 0    Immunizations Immunization History  Administered Date(s) Administered   Fluad Quad(high Dose 65+) 07/18/2019, 07/08/2020, 07/14/2021   Influenza Split 07/06/2011, 06/25/2012   Influenza Whole 07/14/2008, 08/03/2009, 08/24/2010   Influenza, High Dose Seasonal PF 08/10/2016, 06/13/2017, 06/20/2018   Influenza,inj,Quad PF,6+ Mos 06/03/2013, 07/11/2014, 06/29/2015   Moderna Sars-Covid-2 Vaccination 01/02/2020, 02/04/2020, 09/08/2020   Pneumococcal Conjugate-13 02/09/2015   Pneumococcal Polysaccharide-23 07/06/2011   Td 09/22/2010   Zoster, Live 07/19/2011    TDAP status: Due, Education has been provided regarding the importance of this vaccine. Advised may receive this vaccine at local pharmacy or Health Dept. Aware to provide a copy of the vaccination record if obtained from local pharmacy or Health Dept. Verbalized acceptance and understanding.  Flu Vaccine status: Up to date  Pneumococcal vaccine status: Up to date  Covid-19 vaccine status: Completed vaccines  Qualifies for Shingles Vaccine? Yes   Zostavax completed No   Shingrix Completed?: No.    Education has been provided regarding the  importance of this vaccine. Patient has been advised to call insurance company to determine out of pocket expense if they have not yet received this vaccine. Advised may also receive vaccine at local pharmacy or Health Dept. Verbalized acceptance and understanding.  Screening Tests Health Maintenance  Topic Date Due   Zoster Vaccines- Shingrix (1 of 2) Never done   COVID-19 Vaccine (4 - Booster for Moderna series) 11/03/2020   OPHTHALMOLOGY EXAM  11/10/2020   TETANUS/TDAP  08/10/2022 (Originally 09/22/2020)   Hepatitis C Screening  10/14/2098 (Originally 01/21/1964)   HEMOGLOBIN A1C  01/12/2022   FOOT EXAM  01/13/2022   Pneumonia Vaccine 42+ Years old  Completed   INFLUENZA VACCINE  Completed   HPV VACCINES  Aged Out   COLONOSCOPY (Pts 45-29yr Insurance coverage will need to be confirmed)  Discontinued  Health Maintenance  Health Maintenance Due  Topic Date Due   Zoster Vaccines- Shingrix (1 of 2) Never done   COVID-19 Vaccine (4 - Booster for Moderna series) 11/03/2020   OPHTHALMOLOGY EXAM  11/10/2020    Colorectal cancer screening: No longer required.    Additional Screening:  Hepatitis C Screening: does qualify  Vision Screening: Recommended annual ophthalmology exams for early detection of glaucoma and other disorders of the eye. Is the patient up to date with their annual eye exam?  Yes  Who is the provider or what is the name of the office in which the patient attends annual eye exams? Dr Zadie Rhine If pt is not established with a provider, would they like to be referred to a provider to establish care? No .   Dental Screening: Recommended annual dental exams for proper oral hygiene  Community Resource Referral / Chronic Care Management: CRR required this visit?  No   CCM required this visit?  No      Plan:     I have personally reviewed and noted the following in the patients chart:   Medical and social history Use of alcohol, tobacco or illicit drugs   Current medications and supplements including opioid prescriptions. Patient is not currently taking opioid prescriptions. Functional ability and status Nutritional status Physical activity Advanced directives List of other physicians Hospitalizations, surgeries, and ER visits in previous 12 months Vitals Screenings to include cognitive, depression, and falls Referrals and appointments  In addition, I have reviewed and discussed with patient certain preventive protocols, quality metrics, and best practice recommendations. A written personalized care plan for preventive services as well as general preventive health recommendations were provided to patient.     Willette Brace, LPN   [id number]   Nurse Notes: none

## 2021-11-18 ENCOUNTER — Encounter: Payer: Self-pay | Admitting: Internal Medicine

## 2021-11-18 ENCOUNTER — Ambulatory Visit (INDEPENDENT_AMBULATORY_CARE_PROVIDER_SITE_OTHER): Payer: Medicare Other | Admitting: Internal Medicine

## 2021-11-18 ENCOUNTER — Other Ambulatory Visit: Payer: Self-pay

## 2021-11-18 VITALS — BP 100/68 | HR 84 | Ht 68.0 in | Wt 231.8 lb

## 2021-11-18 DIAGNOSIS — E785 Hyperlipidemia, unspecified: Secondary | ICD-10-CM | POA: Diagnosis not present

## 2021-11-18 DIAGNOSIS — E1151 Type 2 diabetes mellitus with diabetic peripheral angiopathy without gangrene: Secondary | ICD-10-CM

## 2021-11-18 DIAGNOSIS — E039 Hypothyroidism, unspecified: Secondary | ICD-10-CM

## 2021-11-18 LAB — POCT GLYCOSYLATED HEMOGLOBIN (HGB A1C): Hemoglobin A1C: 6.2 % — AB (ref 4.0–5.6)

## 2021-11-18 NOTE — Patient Instructions (Addendum)
Please continue:  - Metformin 500 mg 2x a day - Januvia 100 mg before breakfast - Jardiance 10 mg before breakfast   Try to hold Januvia for 1 week and see if the sugars increase.  Please return in 6 months with your sugar log.

## 2021-11-18 NOTE — Progress Notes (Signed)
Patient ID: [name], male   DOB: 1946/07/24, 76 y.o.   MRN: [id number]  This visit occurred during the SARS-CoV-2 public health emergency.  Safety protocols were in place, including screening questions prior to the visit, additional usage of staff PPE, and extensive cleaning of exam room while observing appropriate contact time as indicated for disinfecting solutions.   HPI: [name] is a 76 y.o.-year-old male, returning for DM2, dx 2004, non-insulin-dependent, uncontrolled, with complications (CAD, s/p MI). Last visit 4 months ago. He has [facility] as supplemental and [employer] for meds in 10/2015.  Interim history: No increased urination, blurry vision, nausea, chest pain. He relaxed his diet over the holidays, but did not notice that his sugars increase much.  Reviewed HbA1c levels: Lab Results  Component Value Date   HGBA1C 6.9 (A) 07/14/2021   HGBA1C 5.9 (H) 12/27/2020   HGBA1C 6.3 (A) 07/08/2020  02/27/2019: HbA1c calculated from fructosamine is better: 6.45%. 10/24/2018: HbA1c calculated from fructosamine is slightly higher than the one from 02/2018, at 6.6%. 02/13/2018: The HbA1c calculated from the fructosamine is excellent, at 6.3%! 10/16/2017: HbA1c calculated from fructosamine is: 7.3% (higher). 06/16/2017: HbA1c calculated from the fructosamine is stable, at 6.8%.  12/08/2016: HbA1c calculated from the fructosamine is better, at 6.86%. 08/10/2016: HbA1c calculated from  fructosamine is 7.88% 05/10/2016: HbA1c calculated from fructosamine is 7.6% 01/06/2016: HbA1c calculated from fructosamine is 6.6% 09/06/2016: HbA1c calculated from fructosamine is 7.0% 06/01/2015: HbA1c calculated from fructosamine is 7.0% 02/23/2015: HbA1c calculated from fructosamine is 6.88% 10/15/2014: HbA1c calculated from fructosamine is 7.17% He started to change his eating habits since 03/2013.   He is on: - Metformin 1000 mg 2x a day >> 500 mg 2x a day  - Januvia 100 mg in am -  Jardiance 10 mg in am He does not miss doses. He was on glipizide 10 mg twice a day in the past but this was stopped during hospitalization in 12/2020.  Pt checks his sugars 4 times a day per his excellent log: - am:  119-134, 137 >> 112-136 >> 110-133, 137 >> 110-130, 136 - 2h after breakfast: 144-164 >> 146-164 >> 138-160, 170 >> 144-163 - before lunch 144 >> 110-120s >> n/c   - before dinner: 97-122 >> 110-123 >> 112-127 >> 89-111 >> 96-114 - 2h after dinner:107, 146-159 >> 140-168, 173 >> 132-145, 164 He has hypoglycemia awareness in the 60s. Highest: 287 >> ... 164 >> 173 >> 163.  His wife is a Radiographer, therapeutic.  He saw [name] (nutritionist).  -+ Mild CKD, last BUN/creatinine:  Lab Results  Component Value Date   BUN 25 (H) 09/01/2021   CREATININE 1.43 09/01/2021  On Entresto.  -+ HL:  last set of lipids: Lab Results  Component Value Date   CHOL 79 03/03/2021   HDL 55.90 03/03/2021   LDLCALC 6 03/03/2021   LDLDIRECT 18.0 06/13/2017   TRIG 86.0 03/03/2021   CHOLHDL 1 03/03/2021  He was in the Reveal Study x 4 years >> stopped. On Lipitor 20 and omega-3 fatty acids.  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 Zadie Rhine.  He has a history of cataract surgery Starts recording so is going to be in the exact I will likely endocarditis cannot do things I can read I cannot leave endocarditis Sick I can is nothing that I can elicit ON my but is not metoprolol we tried to  some so board for the I am but something no I have not believe it or not I am not topically blood.  Is able to ride for a long time 3 hours good but yesterday once a month ago with his sister when they came in 8 LADA basis but now they want to take me its allergy he has a conference with him times - no numbness and tingling in his feet.  He is compliant with CPAP for OSA, also has A. Fib, IBD -ulcerative pancolitis, GERD,  also, obesity. In 2019 she was also diagnosed with CHF -EF of 25 to 30%.  His Lasix was increased to twice a day(takes the second dose as needed). Cardiac cath >> no significant obstruction.  He was started on Entresto. He was admitted 3/20-24/2022 for hypoxemic respiratory failure in the setting of CHF after not taking his Lasix for a few weeks due to increased urination.  At that time, he was unresponsive, had A. fib with RVR at that time and a glucose level at 260.  He had to be intubated.  He recovered well afterwards.  He has a history of very low vitamin B12, improved: Lab Results  Component Value Date   VITAMINB12 1,256 (H) 09/02/2020   VITAMINB12 103 (L) 02/24/2020   VITAMINB12 267 05/01/2012   VITAMINB12 201 (L) 11/23/2010   She was started on B12 injections q. weekly for 4 weeks then 1000 mcg p.o. daily by PCP  Hypothyroidism:  Pt is on levothyroxine 50 mcg daily, taken: - in am - fasting - at least 30 min from b'fast - no Ca, Fe, MVI, PPIs - not on Biotin  Latest TSH was reviewed and this was normal: Lab Results  Component Value Date   TSH 1.15 09/01/2021   Pt denies: - feeling nodules in neck - hoarseness - dysphagia - choking - SOB with lying down  ROS: + See HPI  I reviewed pt's medications, allergies, PMH, social hx, family hx, and changes were documented in the history of present illness. Otherwise, unchanged from my initial visit note.  Past Medical History:  Diagnosis Date   B12 deficiency    per patient previously taking shots   C. difficile colitis 04/17/2019   C. difficile diarrhea    CAD (coronary artery disease), native coronary artery 04/12/2007   Chronic atrial fibrillation ([place])    initial diagnoses 2012   Chronic combined systolic and diastolic CHF (congestive heart failure) ([place]) 04/15/2019   CKD (chronic kidney disease), stage III ([place]) 04/15/2019   CORONARY ARTERY DISEASE 04/12/2007   2 stents last in 2006.    Cystoid macular edema of right  eye 01/16/2020   Cystoid macular edema of right eye 01/16/2020   DIABETES MELLITUS, TYPE II 04/16/2007   Diverticulosis of colon (without mention of hemorrhage)    GERD (gastroesophageal reflux disease) 08/09/2011   Hyperlipidemia 04/16/2007   10/24 reveal study end. Atorvastatin 42m   HYPERTENSION 04/12/2007   HYPOTHYROIDISM 04/12/2007   Ischemic cardiomyopathy    Morbid obesity ([facility] 08/09/2011   MYOCARDIAL INFARCTION, HX OF 04/12/2007   Obstructive sleep apnea 04/12/2007   02/2011 - AHI 96/h CPAP 13, Lg FF      Stroke ([facility])    Type II diabetes mellitus with peripheral circulatory disorder [place] 04/16/2007   Dr. GCruzita Lederermanages. Uses fructosamine.     ULCERATIVE COLITIS, LEFT SIDED 11/23/2010   Ulcerative pancolitis without complication [place] 176/72/0947  Ulcerative Colitis. Mesalamine.    VITAMIN B12 DEFICIENCY 11/29/2010  Past Surgical History:  Procedure Laterality Date   CARDIAC CATHETERIZATION  10/2002   STENT. 2 stents Dr. Maurene Capes   CARDIAC CATHETERIZATION N/A 10/28/2015   Procedure: Right/Left Heart Cath and Coronary Angiography;  Surgeon: Sherren Mocha, MD;  Location: [facility];  Service: Cardiovascular;  Laterality: N/A;   CATARACT EXTRACTION     2021 bilateral eyes    COLONOSCOPY  2012   Dr. Sharlett Iles; findings consistent with universal UC s/p random biopsies, no active bleeding, polyps, masses.  Pathology with mild chronic active colitis.  Repeat in 5 years.   CORONARY STENT PLACEMENT  2008   LAD    RIGHT/LEFT HEART CATH AND CORONARY ANGIOGRAPHY N/A 06/01/2018   Procedure: RIGHT/LEFT HEART CATH AND CORONARY ANGIOGRAPHY;  Surgeon: Sherren Mocha, MD;  Location: [facility];  Service: Cardiovascular;  Laterality: N/A;   TONSILLECTOMY     Social History   Socioeconomic History   Marital status: Married    Spouse name: Not on file   Number of children: 1   Years of education: Not on file   Highest education level: Not on file  Occupational History   Occupation:  RETIRED    Employer: RETIRED  Tobacco Use   Smoking status: Former    Packs/day: 1.00    Years: 5.00    Pack years: 5.00    Types: Cigarettes    Quit date: 04/13/1966    Years since quitting: 55.6   Smokeless tobacco: Never  Vaping Use   Vaping Use: Never used  Substance and Sexual Activity   Alcohol use: No    Alcohol/week: 0.0 standard drinks    Comment: no more beer   Drug use: No   Sexual activity: Not on file  Other Topics Concern   Not on file  Social History Narrative   Cardiorehab 3 days a week. 45 minutes to an hour-stationary bike.    GRANDDAUGHTER (MS. 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       Married for 37 years in 2015, married previously for 14 years. Daughter with first wife and 3 grandkids.    Lives alone with wife. Get to see grandkids a lot. New grandchild in middle of 28      Hobbies-yardwork, previously liked to hunt and fish, does some target shooting   Social Determinants of Health   Financial Resource Strain: Low Risk    Difficulty of Paying Living Expenses: Not hard at all  Food Insecurity: No Food Insecurity   Worried About Charity fundraiser in the Last Year: Never true   Arboriculturist in the Last Year: Never true  Transportation Needs: No Transportation Needs   Lack of Transportation (Medical): No   Lack of Transportation (Non-Medical): No  Physical Activity: Inactive   Days of Exercise per Week: 0 days   Minutes of Exercise per Session: 0 min  Stress: No Stress Concern Present   Feeling of Stress : Not at all  Social Connections: Socially Integrated   Frequency of Communication with Friends and Family: More than three times a week   Frequency of Social Gatherings with Friends and Family: More than three times a week   Attends Religious Services: More than 4 times per year   Active Member of [employer] or Organizations: Yes   Attends  Archivist Meetings: 1 to 4 times  per year   Marital Status: Married  Human resources officer Violence: Not At Risk   Fear of Current or Ex-Partner: No   Emotionally Abused: No   Physically Abused: No   Sexually Abused: No   Current Outpatient Medications on File Prior to Visit  Medication Sig Dispense Refill   acetaminophen (TYLENOL) 500 MG tablet Take 1,000 mg by mouth every 6 (six) hours as needed. (Patient not taking: Reported on 11/12/2021)     APRISO 0.375 g 24 hr capsule Take 4 capsules (1.5 g total) by mouth daily. 120 capsule 5   atorvastatin (LIPITOR) 20 MG tablet TAKE ONE TABLET (20MG TOTAL) BY MOUTH DAILY 30 tablet 5   carvedilol (COREG) 25 MG tablet TAKE ONE TABLET BY MOUTH TWICE A DAY 180 tablet 3   clopidogrel (PLAVIX) 75 MG tablet TAKE ONE TABLET (75MG TOTAL) BY MOUTH DAILY 90 tablet 3   Clotrimazole 1 % OINT Apply to penis twice daily after saline bath/rinse and pat dry 56.7 g 0   Coenzyme Q10 (COQ10 PO) Take 1 capsule by mouth daily with supper.     dabigatran (PRADAXA) 150 MG CAPS capsule TAKE ONE CAPSULE (150MG TOTAL) BY MOUTH EVERY 12 HOURS 60 capsule 6   dicyclomine (BENTYL) 10 MG capsule TAKE ONE CAPSULE (10MG TOTAL) BY MOUTH TWO TIMES DAILY AS NEEDED FOR DIARRHEA OR ABDOMINAL CRAMPING 90 capsule 3   empagliflozin (JARDIANCE) 10 MG TABS tablet TAKE ONE (1) TABLET BY MOUTH EVERY DAY 90 tablet 3   fish oil-omega-3 fatty acids 1000 MG capsule Take 1 g by mouth daily with supper.     furosemide (LASIX) 40 MG tablet Take 1 tablet (40 mg total) by mouth 2 (two) times daily. 180 tablet 3   glucose blood test strip Use as instructed to test 4 time daily 400 each 3   isosorbide mononitrate (IMDUR) 60 MG 24 hr tablet TAKE ONE (1) TABLET BY MOUTH EVERY DAY 90 tablet 3   metFORMIN (GLUCOPHAGE) 500 MG tablet Take 1 tablet (500 mg total) by mouth 2 (two) times daily with a meal. 180 tablet 3   saccharomyces boulardii (FLORASTOR) 250 MG capsule Take 1 capsule (250 mg total) by mouth  2 (two) times daily.     sacubitril-valsartan (ENTRESTO) 97-103 MG Take 1 tablet by mouth 2 (two) times daily. 180 tablet 3   sitaGLIPtin (JANUVIA) 100 MG tablet TAKE ONE (1) TABLET BY MOUTH EVERY DAY 90 tablet 3   spironolactone (ALDACTONE) 25 MG tablet TAKE ONE (1) TABLET BY MOUTH EVERY DAY 90 tablet 3   SYNTHROID 50 MCG tablet TAKE ONE TABLET BY MOUTH DAILY BEFORE BREAKFAST 90 tablet 3   triamcinolone (KENALOG) 0.1 % Apply 1 application topically 2 (two) times daily. For 7-10 days maximum 80 g 0   vitamin B-12 (CYANOCOBALAMIN) 500 MCG tablet Take 1,000 mcg by mouth daily.     No current facility-administered medications on file prior to visit.   Allergies  Allergen Reactions   Clindamycin/Lincomycin     Developed C. difficile colitis 1 month after use   Family History  Problem Relation Age of Onset   Heart attack Mother        mid 35s   Heart disease Father        H/O CAD, CABG, VALVE SURGERY   Hypertension Brother    Obesity Brother    Stomach cancer Maternal Aunt    Stomach cancer Paternal Grandmother        ? colon    Colon cancer  Neg Hx     PE: BP 100/68 (BP Location: Right Arm, Patient Position: Sitting, Cuff Size: Normal)    Pulse 84    Ht 5' 8"  (1.727 m)    Wt 231 lb 12.8 oz (105.1 kg)    SpO2 98%    BMI 35.25 kg/m    Wt Readings from Last 3 Encounters:  11/18/21 231 lb 12.8 oz (105.1 kg)  11/10/21 229 lb (103.9 kg)  09/01/21 232 lb 3.2 oz (105.3 kg)   Constitutional: overweight, in NAD Eyes: PERRLA, EOMI, no exophthalmos ENT: moist mucous membranes, no thyromegaly, no cervical lymphadenopathy Cardiovascular: RRR, No MRG, + B periankle edema L>R Respiratory: CTA B Musculoskeletal: no deformities, strength intact in all 4 Skin: moist, warm, + stasis dermatitis rash BLE Neurological: no tremor with outstretched hands, DTR normal in all 4  ASSESSMENT: 1. DM2, non-insulin-dependent, previously uncontrolled, with complications - CAD, s/p MI 10/2006 - s/p stent -  sees [name]  2. Obesity class 3 BMI Classification: < 18.5 underweight  18.5-24.9 normal weight  25.0-29.9 overweight  30.0-34.9 class I obesity  35.0-39.9 class II obesity  ? 40.0 class III obesity   3. HL  4.  Hypothyroidism  PLAN:  1. Patient with longstanding, previously uncontrolled, type 2 diabetes, on oral antidiabetic regimen with metformin, DPP 4 inhibitor, and SGLT2 inhibitor.  He decreased the dose of metformin after losing weight following his C. difficile infection in 2020.  We did not increase it back afterwards.  He was also taken off sulfonylurea at discharge.  We did not restart this either.  At last visit, sugars were slightly worse after multiple celebrations and more dietary indiscretions, but the vast majority of them were still at goal.  HbA1c was 6.9%, higher than before, but still at goal.  We did not change his regimen. -At today's visit, sugars remain mostly at goal, with only very mild hyperglycemic exceptions.  Reviewing his detailed blood sugar log, his sugars are not higher during the holidays.  At this visit, we discussed about trying to stop Januvia, especially since he mentions that this is expensive.  He is reticent to do so, but I advised him to try to hold it for 1 week and see how the sugars change and stop it only if they do not change much. - I advised him to Patient Instructions  Please continue:  - Metformin 500 mg 2x a day - Januvia 100 mg before breakfast - Jardiance 10 mg before breakfast   Try to hold Januvia for 1 week and see if the sugars increase.  Please return in 6 months with your sugar log.   - we checked his HbA1c: 6.2% (lower) - advised to check sugars at different times of the day - 1x a day, rotating check times - advised for yearly eye exams >> he is UTD - return to clinic in 6 months  2. HL -Reviewed latest lipid panel from 02/2021: LDL very low, the rest of fractions at goal: Lab Results  Component Value Date   CHOL  79 03/03/2021   HDL 55.90 03/03/2021   LDLCALC 6 03/03/2021   LDLDIRECT 18.0 06/13/2017   TRIG 86.0 03/03/2021   CHOLHDL 1 03/03/2021  -He continues on Lipitor 20 mg daily and omega-3 fatty acids, without side effects  3.  Hypothyroidism - latest thyroid labs reviewed with pt. >> normal: Lab Results  Component Value Date   TSH 1.15 09/01/2021  - he continues on LT4 50 mcg daily -  pt feels good on this dose. - we discussed about taking the thyroid hormone every day, with water, >30 minutes before breakfast, separated by >4 hours from acid reflux medications, calcium, iron, multivitamins. Pt. is taking it correctly.  Philemon Kingdom, MD PhD [facility] Endocrinology

## 2021-12-28 ENCOUNTER — Other Ambulatory Visit: Payer: Self-pay

## 2021-12-28 ENCOUNTER — Ambulatory Visit: Payer: Medicare Other | Admitting: Cardiovascular Disease

## 2021-12-28 ENCOUNTER — Encounter: Payer: Self-pay | Admitting: Cardiovascular Disease

## 2021-12-28 ENCOUNTER — Ambulatory Visit (INDEPENDENT_AMBULATORY_CARE_PROVIDER_SITE_OTHER): Payer: Medicare Other | Admitting: Cardiovascular Disease

## 2021-12-28 VITALS — BP 100/70 | HR 87 | Ht 68.0 in | Wt 226.2 lb

## 2021-12-28 DIAGNOSIS — E119 Type 2 diabetes mellitus without complications: Secondary | ICD-10-CM | POA: Diagnosis not present

## 2021-12-28 DIAGNOSIS — I5022 Chronic systolic (congestive) heart failure: Secondary | ICD-10-CM

## 2021-12-28 DIAGNOSIS — I2510 Atherosclerotic heart disease of native coronary artery without angina pectoris: Secondary | ICD-10-CM

## 2021-12-28 DIAGNOSIS — I482 Chronic atrial fibrillation, unspecified: Secondary | ICD-10-CM

## 2021-12-28 MED ORDER — APIXABAN 5 MG PO TABS: 5.0000 mg | ORAL_TABLET | Freq: Two times a day (BID) | ORAL | 3 refills | Status: DC

## 2021-12-28 NOTE — Progress Notes (Signed)
?Cardiology Office Note:   ? ?Date:  12/28/2021  ? ?ID:  [name], DOB Nov 10, 1945, MRN [id number] ? ?PCP:  Marin Olp, MD ?  ?[facility] Providers ?Cardiologist:  Sherren Mocha, MD ?Electrophysiologist:  Thompson Grayer, MD  ?Advanced Heart Failure:  Glori Bickers, MD    ? ?Referring MD: Marin Olp, MD  ? ?Chief Complaint  ?Patient presents with  ? Atrial Fibrillation  ? ? ?History of Present Illness:   ? ?[name] is a 76 y.o. male with a hx of CAD s/p DES to LAD 2004, STEMI 2008 2/2 very late stent thombosis s/p PTCA/DES to LAD, chronic Afib, chronic combined CHF, CKD IIIb (border a-b), OSA on CPAP, morbid obesity, HTN, HLD, former smoker, and Type 2 DM.  The patient is here alone today.  He has been doing well.  He is lost a lot of weight and states that he is done this by just cutting back on his food intake.  He feels a lot better with his pant size down from a 52 waist to 46 waist.  He denies chest pain, chest pressure, shortness of breath, orthopnea, PND, lightheadedness, or syncope.  He has mild leg edema which is unchanged.  Overall he is feeling well. ? ?Past Medical History:  ?Diagnosis Date  ? B12 deficiency   ? per patient previously taking shots  ? C. difficile colitis 04/17/2019  ? C. difficile diarrhea   ? CAD (coronary artery disease), native coronary artery 04/12/2007  ? Chronic atrial fibrillation (HCC)   ? initial diagnoses 2012  ? Chronic combined systolic and diastolic CHF (congestive heart failure) ([place]) 04/15/2019  ? CKD (chronic kidney disease), stage III ([place]) 04/15/2019  ? CORONARY ARTERY DISEASE 04/12/2007  ? 2 stents last in 2006.   ? Cystoid macular edema of right eye 01/16/2020  ? Cystoid macular edema of right eye 01/16/2020  ? DIABETES MELLITUS, TYPE II 04/16/2007  ? Diverticulosis of colon (without mention of hemorrhage)   ? GERD (gastroesophageal reflux disease) 08/09/2011  ? Hyperlipidemia 04/16/2007  ? 10/24 reveal study end. Atorvastatin 46m  ? HYPERTENSION  04/12/2007  ? HYPOTHYROIDISM 04/12/2007  ? Ischemic cardiomyopathy   ? Morbid obesity ([place] 08/09/2011  ? MYOCARDIAL INFARCTION, HX OF 04/12/2007  ? Obstructive sleep apnea 04/12/2007  ? 02/2011 - AHI 96/h CPAP 13, Lg FF     ? Stroke ([facility]   ? Type II diabetes mellitus with peripheral circulatory disorder ([place] 04/16/2007  ? Dr. GCruzita Lederermanages. Uses fructosamine.    ? ULCERATIVE COLITIS, LEFT SIDED 11/23/2010  ? Ulcerative pancolitis without complication ([place] 165/53/7482 ? Ulcerative Colitis. Mesalamine.   ? VITAMIN B12 DEFICIENCY 11/29/2010  ? ? ?Past Surgical History:  ?Procedure Laterality Date  ? CARDIAC CATHETERIZATION  10/2002  ? STENT. 2 stents Dr. BMaurene Capes ? CARDIAC CATHETERIZATION N/A 10/28/2015  ? Procedure: Right/Left Heart Cath and Coronary Angiography;  Surgeon: MSherren Mocha MD;  [facility];  Service: Cardiovascular;  Laterality: N/A;  ? CATARACT EXTRACTION    ? 2021 bilateral eyes   ? COLONOSCOPY  2012  ? Dr. PSharlett Iles findings consistent with universal UC s/p random biopsies, no active bleeding, polyps, masses.  Pathology with mild chronic active colitis.  Repeat in 5 years.  ? CORONARY STENT PLACEMENT  2008  ? LAD   ? RIGHT/LEFT HEART CATH AND CORONARY ANGIOGRAPHY N/A 06/01/2018  ? Procedure: RIGHT/LEFT HEART CATH AND CORONARY ANGIOGRAPHY;  Surgeon: CSherren Mocha MD;  [facility];  Service: Cardiovascular;  Laterality: N/A;  ? TONSILLECTOMY    ? ? ?Current Medications: ?Current Meds  ?Medication Sig  ? acetaminophen (TYLENOL) 500 MG tablet Take 1,000 mg by mouth every 6 (six) hours as needed.  ? APRISO 0.375 g 24 hr capsule Take 4 capsules (1.5 g total) by mouth daily.  ? atorvastatin (LIPITOR) 20 MG tablet TAKE ONE TABLET (20MG TOTAL) BY MOUTH DAILY  ? carvedilol (COREG) 25 MG tablet TAKE ONE TABLET BY MOUTH TWICE A DAY  ? clopidogrel (PLAVIX) 75 MG tablet TAKE ONE TABLET (75MG TOTAL) BY MOUTH DAILY  ? Clotrimazole 1 % OINT Apply to penis twice daily after saline  bath/rinse and pat dry  ? Coenzyme Q10 (COQ10 PO) Take 1 capsule by mouth daily with supper.  ? dabigatran (PRADAXA) 150 MG CAPS capsule TAKE ONE CAPSULE (150MG TOTAL) BY MOUTH EVERY 12 HOURS  ? dicyclomine (BENTYL) 10 MG capsule TAKE ONE CAPSULE (10MG TOTAL) BY MOUTH TWO TIMES DAILY AS NEEDED FOR DIARRHEA OR ABDOMINAL CRAMPING  ? empagliflozin (JARDIANCE) 10 MG TABS tablet TAKE ONE (1) TABLET BY MOUTH EVERY DAY  ? fish oil-omega-3 fatty acids 1000 MG capsule Take 1 g by mouth daily with supper.  ? furosemide (LASIX) 40 MG tablet Take 1 tablet (40 mg total) by mouth 2 (two) times daily.  ? glucose blood test strip Use as instructed to test 4 time daily  ? isosorbide mononitrate (IMDUR) 60 MG 24 hr tablet TAKE ONE (1) TABLET BY MOUTH EVERY DAY  ? metFORMIN (GLUCOPHAGE) 500 MG tablet Take 1 tablet (500 mg total) by mouth 2 (two) times daily with a meal.  ? saccharomyces boulardii (FLORASTOR) 250 MG capsule Take 1 capsule (250 mg total) by mouth 2 (two) times daily.  ? sacubitril-valsartan (ENTRESTO) 97-103 MG Take 1 tablet by mouth 2 (two) times daily.  ? sitaGLIPtin (JANUVIA) 100 MG tablet TAKE ONE (1) TABLET BY MOUTH EVERY DAY  ? spironolactone (ALDACTONE) 25 MG tablet TAKE ONE (1) TABLET BY MOUTH EVERY DAY  ? SYNTHROID 50 MCG tablet TAKE ONE TABLET BY MOUTH DAILY BEFORE BREAKFAST  ? triamcinolone (KENALOG) 0.1 % Apply 1 application topically 2 (two) times daily. For 7-10 days maximum  ? vitamin B-12 (CYANOCOBALAMIN) 500 MCG tablet Take 1,000 mcg by mouth daily.  ?  ? ?Allergies:   Clindamycin/lincomycin  ? ?Social History  ? ?Socioeconomic History  ? Marital status: Married  ?  Spouse name: Not on file  ? Number of children: 1  ? Years of education: Not on file  ? Highest education level: Not on file  ?Occupational History  ? Occupation: RETIRED  ?  Employer: RETIRED  ?Tobacco Use  ? Smoking status: Former  ?  Packs/day: 1.00  ?  Years: 5.00  ?  Pack years: 5.00  ?  Types: Cigarettes  ?  Quit date: 04/13/1966  ?   Years since quitting: 55.7  ? Smokeless tobacco: Never  ?Vaping Use  ? Vaping Use: Never used  ?Substance and Sexual Activity  ? Alcohol use: No  ?  Alcohol/week: 0.0 standard drinks  ?  Comment: no more beer  ? Drug use: No  ? Sexual activity: Not on file  ?Other Topics Concern  ? Not on file  ?Social History Narrative  ? Cardiorehab 3 days a week. 45 minutes to an hour-stationary bike.   ? GRANDDAUGHTER (MS. PETTIGREW) IS AN RN ON 2000 @ [employer]  ?   ? Retired from Occupational hygienist  ?  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   ?   ? Married for 37 years in 2015, married previously for 14 years. Daughter with first wife and 3 grandkids.   ? Lives alone with wife. Get to see grandkids a lot. New grandchild in middle of 2015  ?   ? Hobbies-yardwork, previously liked to hunt and fish, does some target shooting  ? ?Social Determinants of Health  ? ?Financial Resource Strain: Low Risk   ? Difficulty of Paying Living Expenses: Not hard at all  ?Food Insecurity: No Food Insecurity  ? Worried About Charity fundraiser in the Last Year: Never true  ? Ran Out of Food in the Last Year: Never true  ?Transportation Needs: No Transportation Needs  ? Lack of Transportation (Medical): No  ? Lack of Transportation (Non-Medical): No  ?Physical Activity: Inactive  ? Days of Exercise per Week: 0 days  ? Minutes of Exercise per Session: 0 min  ?Stress: No Stress Concern Present  ? Feeling of Stress : Not at all  ?Social Connections: Socially Integrated  ? Frequency of Communication with Friends and Family: More than three times a week  ? Frequency of Social Gatherings with Friends and Family: More than three times a week  ? Attends Religious Services: More than 4 times per year  ? Active Member of Clubs or Organizations: Yes  ? Attends Archivist Meetings: 1 to 4 times per year  ? Marital Status: Married  ?  ? ?Family History: ?The patient's family history includes Heart attack in his mother; Heart  disease in his father; Hypertension in his brother; Obesity in his brother; Stomach cancer in his maternal aunt and paternal grandmother. There is no history of Colon cancer. ? ?ROS:   ?Please see the history of pres

## 2021-12-28 NOTE — Patient Instructions (Signed)
Medication Instructions:  ?Your physician recommends that you continue on your current medications as directed. Please refer to the Current Medication list given to you today. ? ?*If you need a refill on your cardiac medications before your next appointment, please call your pharmacy* ? ? ?Lab Work: ?NONE ?If you have labs (blood work) drawn today and your tests are completely normal, you will receive your results only by: ?MyChart Message (if you have MyChart) OR ?A paper copy in the mail ?If you have any lab test that is abnormal or we need to change your treatment, we will call you to review the results. ? ? ?Testing/Procedures: ?NONE ? ? ?Follow-Up: ?At [facility], you and your health needs are our priority.  As part of our continuing mission to provide you with exceptional heart care, we have created designated Provider Care Teams.  These Care Teams include your primary Cardiologist (physician) and Advanced Practice Providers (APPs -  Physician Assistants and Nurse Practitioners) who all work together to provide you with the care you need, when you need it. ? ? ?Your next appointment:   ?6 month(s) ? ?The format for your next appointment:   ?In Person ? ?Provider:   ?Richardson Dopp, PA-C     Then, Sherren Mocha, MD will plan to see you again in 1 year(s).  ? ?  ?

## 2022-01-04 ENCOUNTER — Telehealth: Payer: Self-pay

## 2022-01-04 NOTE — Telephone Encounter (Signed)
**Note De-Identified  Obfuscation** I attempted to do a Eliquis PA but per covermymeds it is not required. ? ?I called [place] and was advised that the pts insurance is paying their part and that the pts co-pay for a 30 day supply of Eliquis is $149.74 and that this cost is due to a unmet deductible. ?

## 2022-01-05 ENCOUNTER — Encounter: Payer: Self-pay | Admitting: Family Medicine

## 2022-01-05 ENCOUNTER — Encounter: Payer: Self-pay | Admitting: Podiatry

## 2022-01-05 ENCOUNTER — Ambulatory Visit (INDEPENDENT_AMBULATORY_CARE_PROVIDER_SITE_OTHER): Payer: Medicare Other | Admitting: Family Medicine

## 2022-01-05 ENCOUNTER — Ambulatory Visit (INDEPENDENT_AMBULATORY_CARE_PROVIDER_SITE_OTHER): Payer: Medicare Other | Admitting: Podiatry

## 2022-01-05 VITALS — BP 124/72 | HR 64 | Temp 98.8°F | Ht 68.0 in | Wt 226.2 lb

## 2022-01-05 DIAGNOSIS — E1151 Type 2 diabetes mellitus with diabetic peripheral angiopathy without gangrene: Secondary | ICD-10-CM

## 2022-01-05 DIAGNOSIS — M79674 Pain in right toe(s): Secondary | ICD-10-CM | POA: Diagnosis not present

## 2022-01-05 DIAGNOSIS — I2510 Atherosclerotic heart disease of native coronary artery without angina pectoris: Secondary | ICD-10-CM

## 2022-01-05 DIAGNOSIS — L97512 Non-pressure chronic ulcer of other part of right foot with fat layer exposed: Secondary | ICD-10-CM | POA: Diagnosis not present

## 2022-01-05 DIAGNOSIS — M79675 Pain in left toe(s): Secondary | ICD-10-CM | POA: Diagnosis not present

## 2022-01-05 DIAGNOSIS — B351 Tinea unguium: Secondary | ICD-10-CM

## 2022-01-05 MED ORDER — SULFAMETHOXAZOLE-TRIMETHOPRIM 800-160 MG PO TABS: 1.0000 | ORAL_TABLET | Freq: Two times a day (BID) | ORAL | 0 refills | Status: DC

## 2022-01-05 MED ORDER — AMOXICILLIN-POT CLAVULANATE 875-125 MG PO TABS: 1.0000 | ORAL_TABLET | Freq: Two times a day (BID) | ORAL | 0 refills | Status: DC

## 2022-01-05 MED ORDER — MUPIROCIN 2 % EX OINT: 1.0000 "application " | TOPICAL_OINTMENT | Freq: Two times a day (BID) | CUTANEOUS | 1 refills | Status: DC

## 2022-01-05 NOTE — Progress Notes (Signed)
? ?Subjective:  ? ? ? Patient ID: [name], male    DOB: 1946-07-22, 76 y.o.   MRN: [id number] ? ?Chief Complaint  ?Patient presents with  ? Ulcer  ?  Ulcer on 2nd toe of right foot, noticed 2 weeks ago ?Causing some pain, toenail has fungus ?Going to wound clinic on Monday ?  ? ? ?HPI- here w/granddaughter-RN-lives 5 min away. ?Ulcer R 2nd toe for past 2 wks.gets nails trimmed in [place]-last time in Dec. [facility]-long time ago.  Some pain.  Has ulcer that granddaughter saw yesterday.  Will see Dr. Paulla Dolly at 4 today.   Putting bactroban cream and drsg changes since yesterday.  Has nail fungus. No trauma.  Chronically, great toe angled to 2nd toe.  Some drainage and odor.  ?Going to wound clinic on 01/10/22 ?No F/C ? ?Health Maintenance Due  ?Topic Date Due  ? Zoster Vaccines- Shingrix (1 of 2) Never done  ? COVID-19 Vaccine (4 - Booster for Moderna series) 11/03/2020  ? OPHTHALMOLOGY EXAM  11/10/2020  ? ? ?Past Medical History:  ?Diagnosis Date  ? B12 deficiency   ? per patient previously taking shots  ? C. difficile colitis 04/17/2019  ? C. difficile diarrhea   ? CAD (coronary artery disease), native coronary artery 04/12/2007  ? Chronic atrial fibrillation (HCC)   ? initial diagnoses 2012  ? Chronic combined systolic and diastolic CHF (congestive heart failure) ([place]) 04/15/2019  ? CKD (chronic kidney disease), stage III ([place]) 04/15/2019  ? CORONARY ARTERY DISEASE 04/12/2007  ? 2 stents last in 2006.   ? Cystoid macular edema of right eye 01/16/2020  ? Cystoid macular edema of right eye 01/16/2020  ? DIABETES MELLITUS, TYPE II 04/16/2007  ? Diverticulosis of colon (without mention of hemorrhage)   ? GERD (gastroesophageal reflux disease) 08/09/2011  ? Hyperlipidemia 04/16/2007  ? 10/24 reveal study end. Atorvastatin 63m  ? HYPERTENSION 04/12/2007  ? HYPOTHYROIDISM 04/12/2007  ? Ischemic cardiomyopathy   ? Morbid obesity ([place] 08/09/2011  ? MYOCARDIAL INFARCTION, HX OF 04/12/2007  ? Obstructive sleep apnea 04/12/2007   ? 02/2011 - AHI 96/h CPAP 13, Lg FF     ? Stroke ([facility]   ? Type II diabetes mellitus with peripheral circulatory disorder ([place] 04/16/2007  ? Dr. GCruzita Lederermanages. Uses fructosamine.    ? ULCERATIVE COLITIS, LEFT SIDED 11/23/2010  ? Ulcerative pancolitis without complication ([place] 170/35/0093 ? Ulcerative Colitis. Mesalamine.   ? VITAMIN B12 DEFICIENCY 11/29/2010  ? ? ?Past Surgical History:  ?Procedure Laterality Date  ? CARDIAC CATHETERIZATION  10/2002  ? STENT. 2 stents Dr. BMaurene Capes ? CARDIAC CATHETERIZATION N/A 10/28/2015  ? Procedure: Right/Left Heart Cath and Coronary Angiography;  Surgeon: MSherren Mocha MD;  [facility];  Service: Cardiovascular;  Laterality: N/A;  ? CATARACT EXTRACTION    ? 2021 bilateral eyes   ? COLONOSCOPY  2012  ? Dr. PSharlett Iles findings consistent with universal UC s/p random biopsies, no active bleeding, polyps, masses.  Pathology with mild chronic active colitis.  Repeat in 5 years.  ? CORONARY STENT PLACEMENT  2008  ? LAD   ? RIGHT/LEFT HEART CATH AND CORONARY ANGIOGRAPHY N/A 06/01/2018  ? Procedure: RIGHT/LEFT HEART CATH AND CORONARY ANGIOGRAPHY;  Surgeon: CSherren Mocha MD;  [facility];  Service: Cardiovascular;  Laterality: N/A;  ? TONSILLECTOMY    ? ? ?Outpatient Medications Prior to Visit  ?Medication Sig Dispense Refill  ? acetaminophen (TYLENOL) 500 MG tablet Take  1,000 mg by mouth every 6 (six) hours as needed.    ? APRISO 0.375 g 24 hr capsule Take 4 capsules (1.5 g total) by mouth daily. 120 capsule 5  ? atorvastatin (LIPITOR) 20 MG tablet TAKE ONE TABLET (20MG TOTAL) BY MOUTH DAILY 30 tablet 5  ? carvedilol (COREG) 25 MG tablet TAKE ONE TABLET BY MOUTH TWICE A DAY 180 tablet 3  ? clopidogrel (PLAVIX) 75 MG tablet TAKE ONE TABLET (75MG TOTAL) BY MOUTH DAILY 90 tablet 3  ? Clotrimazole 1 % OINT Apply to penis twice daily after saline bath/rinse and pat dry 56.7 g 0  ? Coenzyme Q10 (COQ10 PO) Take 1 capsule by mouth daily with supper.    ?  dicyclomine (BENTYL) 10 MG capsule TAKE ONE CAPSULE (10MG TOTAL) BY MOUTH TWO TIMES DAILY AS NEEDED FOR DIARRHEA OR ABDOMINAL CRAMPING 90 capsule 3  ? empagliflozin (JARDIANCE) 10 MG TABS tablet TAKE ONE (1) TABLET BY MOUTH EVERY DAY 90 tablet 3  ? fish oil-omega-3 fatty acids 1000 MG capsule Take 1 g by mouth daily with supper.    ? furosemide (LASIX) 40 MG tablet Take 1 tablet (40 mg total) by mouth 2 (two) times daily. 180 tablet 3  ? glucose blood test strip Use as instructed to test 4 time daily 400 each 3  ? isosorbide mononitrate (IMDUR) 60 MG 24 hr tablet TAKE ONE (1) TABLET BY MOUTH EVERY DAY 90 tablet 3  ? metFORMIN (GLUCOPHAGE) 500 MG tablet Take 1 tablet (500 mg total) by mouth 2 (two) times daily with a meal. 180 tablet 3  ? saccharomyces boulardii (FLORASTOR) 250 MG capsule Take 1 capsule (250 mg total) by mouth 2 (two) times daily.    ? sacubitril-valsartan (ENTRESTO) 97-103 MG Take 1 tablet by mouth 2 (two) times daily. 180 tablet 3  ? sitaGLIPtin (JANUVIA) 100 MG tablet TAKE ONE (1) TABLET BY MOUTH EVERY DAY 90 tablet 3  ? spironolactone (ALDACTONE) 25 MG tablet TAKE ONE (1) TABLET BY MOUTH EVERY DAY 90 tablet 3  ? SYNTHROID 50 MCG tablet TAKE ONE TABLET BY MOUTH DAILY BEFORE BREAKFAST 90 tablet 3  ? triamcinolone (KENALOG) 0.1 % Apply 1 application topically 2 (two) times daily. For 7-10 days maximum 80 g 0  ? vitamin B-12 (CYANOCOBALAMIN) 500 MCG tablet Take 1,000 mcg by mouth daily.    ? apixaban (ELIQUIS) 5 MG TABS tablet Take 1 tablet (5 mg total) by mouth 2 (two) times daily. (Patient not taking: Reported on 01/05/2022) 180 tablet 3  ? ?No facility-administered medications prior to visit.  ? ? ?Allergies  ?Allergen Reactions  ? Clindamycin/Lincomycin   ?  Developed C. difficile colitis 1 month after use  ? ?ROS neg/noncontributory except as noted HPI/below ? ? ?   ?Objective:  ?  ? ?BP 124/72   Pulse 64   Temp 98.8 ?F (37.1 ?C) (Temporal)   Ht 5' 8"  (1.727 m)   Wt 226 lb 4 oz (102.6 kg)    SpO2 99%   BMI 34.40 kg/m?  ?Wt Readings from Last 3 Encounters:  ?01/05/22 226 lb 4 oz (102.6 kg)  ?12/28/21 226 lb 3.2 oz (102.6 kg)  ?11/18/21 231 lb 12.8 oz (105.1 kg)  ? ? ?Physical Exam  ? ?Gen: WDWN NAD OWM ?HEENT: NCAT, conjunctiva not injected, sclera nonicteric ?NECK:  supple, no thyromegaly, no nodes, no carotid bruits ?CARDIAC: irreg irreg, S1S2+, +2/6 murmur. DP 1+B ?LUNGS: CTAB. No wheezes ?ABDOMEN:  BS+, soft, NTND, No HSM, no masses ?  EXT:  1+bipedal edema w/chronic venous stasis hyperpigmentation ?MSK: no gross abnormalities.  ?NEURO: A&O x3.  CN II-XII intact.  ?PSYCH: normal mood. Good eye contact ? ?L heel-superficial abrasion ?Both feet, fungal, thickened skin and nails ?L foot cool, but pulses palp ?R foot-some swelling, warm, red.  2nd toe-red swollen.  Ulcer on medial side - odor.  Not wet.   ? ?   ?Assessment & Plan:  ? ?Problem List Items Addressed This Visit   ? ?  ? Cardiovascular and Mediastinum  ? Type II diabetes mellitus with peripheral circulatory disorder (HCC)  ? ?Other Visit Diagnoses   ? ? Skin ulcer of second toe of right foot with fat layer exposed ([place])    -  Primary  ? ?  ? Skin ulcer R second toe-augmentin and bactrim.  Has appt w/Pod and then wound care.  If worse, f/c, etc to ER.  In a DM pt. ?DM type 2-controlled but complicates above. ? ?Meds ordered this encounter  ?Medications  ? amoxicillin-clavulanate (AUGMENTIN) 875-125 MG tablet  ?  Sig: Take 1 tablet by mouth 2 (two) times daily.  ?  Dispense:  20 tablet  ?  Refill:  0  ? sulfamethoxazole-trimethoprim (BACTRIM DS) 800-160 MG tablet  ?  Sig: Take 1 tablet by mouth 2 (two) times daily.  ?  Dispense:  20 tablet  ?  Refill:  0  ? mupirocin ointment (BACTROBAN) 2 %  ?  Sig: Apply 1 application. topically 2 (two) times daily.  ?  Dispense:  22 g  ?  Refill:  1  ? ? ?Wellington Hampshire, MD ? ?

## 2022-01-05 NOTE — Patient Instructions (Addendum)
It was very nice to see you today! ? ?Antibiotics sent to pharm. ?Glad you are seeing Podiatry and wound care.   If worse, fevers-to ER ? ? ? ?PLEASE NOTE: ? ?If you had any lab tests please let us know if you have not heard back within a few days. You may see your results on MyChart before we have a chance to review them but we will give you a call once they are reviewed by [place]. If we ordered any referrals today, please let us know if you have not heard from their office within the next week.  ? ?Please try these tips to maintain a healthy lifestyle: ? ?Eat most of your calories during the day when you are active. Eliminate processed foods including packaged sweets (pies, cakes, cookies), reduce intake of potatoes, white bread, white pasta, and white rice. Look for whole grain options, oat flour or almond flour. ? ?Each meal should contain half fruits/vegetables, one quarter protein, and one quarter carbs (no bigger than a computer mouse). ? ?Cut down on sweet beverages. This includes juice, soda, and sweet tea. Also watch fruit intake, though this is a healthier sweet option, it still contains natural sugar! Limit to 3 servings daily. ? ?Drink at least 1 glass of water with each meal and aim for at least 8 glasses per day ? ?Exercise at least 150 minutes every week.   ?

## 2022-01-06 NOTE — Progress Notes (Signed)
Subjective:  ? ?Patient ID: [name], male   DOB: 76 y.o.   MRN: [id number]  ? ?HPI ?Patient presents stating that the second toe right has had a sore on it for several weeks that he is on an antibiotic and he is due to see the wound care center early next week.  Also the heel left they wanted checked and he has severe nail disease 1-5 both feet that are thickened dystrophic and painful.  Patient does not currently smoke and is not active ? ? ?Review of Systems  ?All other systems reviewed and are negative. ? ? ?   ?Objective:  ?Physical Exam ?Vitals and nursing note reviewed.  ?Constitutional:   ?   Appearance: He is well-developed.  ?Pulmonary:  ?   Effort: Pulmonary effort is normal.  ?Musculoskeletal:     ?   General: Normal range of motion.  ?Skin: ?   General: Skin is warm.  ?Neurological:  ?   Mental Status: He is alert.  ?  ?Vascular status mildly diminished but he does have palpable pulses with warm toes.  He is noted to have severely elongated thickened nailbeds 1-5 both feet that are dystrophic and there is pressure occurring between adjacent digits most likely how he developed this breakdown in the second digit.  There is an area on the medial side of the second toe at the inner phalangeal joint that is in the subcutaneous tissue localized with redness of the toe itself no redness into the foot or other area or edema or calor.  Patient's left heel shows some crusted tissue but no breakdown of skin currently ? ?   ?Assessment:  ?Ulceration of the right second toe that is localized short-term with what appears to be currently adequate circulation with mycotic nail infection 1-5 both feet with patient due to go to wound care center ? ?   ?Plan:  ?H&P reviewed condition and discussed that ultimately we may need to be aggressive with this or it is possible he will have to have an amputation of the second digit.  He will utilize wound care I did apply medication to it with sterile dressing to try to  reduce the issue and he will continue his antibiotics and hopefully that will make a difference short-term.  I debrided nailbeds 1-5 both feet no iatrogenic bleeding which can be done routinely but I am concerned about the second digit and its viability but I am going to allow wound care to first evaluate see if any healing can occur and then make a decision about possible future amputation ?   ? ? ?

## 2022-01-10 ENCOUNTER — Encounter (HOSPITAL_BASED_OUTPATIENT_CLINIC_OR_DEPARTMENT_OTHER): Payer: Medicare Other | Attending: General Surgery | Admitting: General Surgery

## 2022-01-10 ENCOUNTER — Ambulatory Visit (HOSPITAL_COMMUNITY)
Admission: RE | Admit: 2022-01-10 | Discharge: 2022-01-10 | Disposition: A | Discharge status: Home / Self Care | Payer: Medicare Other | Source: Ambulatory Visit | Attending: General Surgery | Admitting: General Surgery

## 2022-01-10 ENCOUNTER — Other Ambulatory Visit (HOSPITAL_COMMUNITY): Payer: Self-pay | Admitting: General Surgery

## 2022-01-10 DIAGNOSIS — I25118 Atherosclerotic heart disease of native coronary artery with other forms of angina pectoris: Secondary | ICD-10-CM | POA: Insufficient documentation

## 2022-01-10 DIAGNOSIS — M19071 Primary osteoarthritis, right ankle and foot: Secondary | ICD-10-CM | POA: Diagnosis not present

## 2022-01-10 DIAGNOSIS — K5100 Ulcerative (chronic) pancolitis without complications: Secondary | ICD-10-CM | POA: Insufficient documentation

## 2022-01-10 DIAGNOSIS — E1151 Type 2 diabetes mellitus with diabetic peripheral angiopathy without gangrene: Secondary | ICD-10-CM | POA: Diagnosis not present

## 2022-01-10 DIAGNOSIS — I5042 Chronic combined systolic (congestive) and diastolic (congestive) heart failure: Secondary | ICD-10-CM | POA: Diagnosis not present

## 2022-01-10 DIAGNOSIS — L97512 Non-pressure chronic ulcer of other part of right foot with fat layer exposed: Secondary | ICD-10-CM | POA: Diagnosis not present

## 2022-01-10 DIAGNOSIS — M869 Osteomyelitis, unspecified: Secondary | ICD-10-CM | POA: Diagnosis not present

## 2022-01-10 DIAGNOSIS — I110 Hypertensive heart disease with heart failure: Secondary | ICD-10-CM | POA: Diagnosis not present

## 2022-01-10 DIAGNOSIS — E1169 Type 2 diabetes mellitus with other specified complication: Secondary | ICD-10-CM | POA: Insufficient documentation

## 2022-01-10 DIAGNOSIS — E11621 Type 2 diabetes mellitus with foot ulcer: Secondary | ICD-10-CM

## 2022-01-10 DIAGNOSIS — L97509 Non-pressure chronic ulcer of other part of unspecified foot with unspecified severity: Secondary | ICD-10-CM | POA: Insufficient documentation

## 2022-01-10 DIAGNOSIS — I739 Peripheral vascular disease, unspecified: Secondary | ICD-10-CM | POA: Diagnosis not present

## 2022-01-10 DIAGNOSIS — L97516 Non-pressure chronic ulcer of other part of right foot with bone involvement without evidence of necrosis: Secondary | ICD-10-CM | POA: Diagnosis not present

## 2022-01-10 DIAGNOSIS — M7989 Other specified soft tissue disorders: Secondary | ICD-10-CM | POA: Diagnosis not present

## 2022-01-10 NOTE — Progress Notes (Signed)
CHANTRY, HEADEN ([id number]) ?Visit Report for 01/10/2022 ?Chief Complaint Document Details ?Patient Name: Date of Service: ?DA LTO N, MA RV IN B. 01/10/2022 8:00 A M ?Medical Record Number: [id number] ?Patient Account Number: [id number] ?Date of Birth/Sex: Treating RN: ?1946-09-25 (76 y.o. [name]) Dellie Catholic ?Primary Care Provider: Garret Reddish Other Clinician: ?Referring Provider: ?Treating Provider/Extender: Fredirick Maudlin ?Garret Reddish ?Weeks in Treatment: 0 ?Information Obtained from: Patient ?Chief Complaint ?Patients presents for treatment of an open diabetic ulcer ?Electronic Signature(s) ?Signed: 01/10/2022 9:53:09 AM By: Fredirick Maudlin MD FACS ?Entered By: Fredirick Maudlin on 01/10/2022 09:53:09 ?-------------------------------------------------------------------------------- ?Debridement Details ?Patient Name: Date of Service: ?DA LTO N, MA RV IN B. 01/10/2022 8:00 A M ?Medical Record Number: [id number] ?Patient Account Number: [id number] ?Date of Birth/Sex: Treating RN: ?28-Oct-1945 (76 y.o. [name]) Dellie Catholic ?Primary Care Provider: Garret Reddish Other Clinician: ?Referring Provider: ?Treating Provider/Extender: Fredirick Maudlin ?Garret Reddish ?Weeks in Treatment: 0 ?Debridement Performed for Assessment: Wound #1 Right,Medial T Second ?oe ?Performed By: Physician Fredirick Maudlin, MD ?Debridement Type: Debridement ?Severity of Tissue Pre Debridement: Fat layer exposed ?Level of Consciousness (Pre-procedure): Awake and Alert ?Pre-procedure Verification/Time Out Yes - 09:45 ?Taken: ?Start Time: 09:45 ?Pain Control: ?Other : benzocaine ?T Area Debrided (L x W): ?otal 0.9 (cm) x 0.8 (cm) = 0.72 (cm?) ?Tissue and other material debrided: Non-Viable, Slough, Subcutaneous, [place] ?Level: Skin/Subcutaneous Tissue ?Debridement Description: Excisional ?Instrument: Curette ?Specimen: Tissue Culture ?Number of Specimens T aken: 1 ?Bleeding: Minimum ?Hemostasis Achieved: Pressure ?End Time: 09:48 ?Procedural Pain:  0 ?Post Procedural Pain: 0 ?Response to Treatment: Procedure was tolerated well ?Level of Consciousness (Post- Awake and Alert ?procedure): ?Post Debridement Measurements of Total Wound ?Length: (cm) 0.9 ?Width: (cm) 0.8 ?Depth: (cm) 0.3 ?Volume: (cm?) 0.17 ?Character of Wound/Ulcer Post Debridement: Improved ?Severity of Tissue Post Debridement: Fat layer exposed ?Post Procedure Diagnosis ?Same as Pre-procedure ?Electronic Signature(s) ?Signed: 01/10/2022 10:53:26 AM By: Fredirick Maudlin MD FACS ?Signed: 01/10/2022 5:04:21 PM By: Dellie Catholic RN ?Entered By: Dellie Catholic on 01/10/2022 09:52:33 ?-------------------------------------------------------------------------------- ?HPI Details ?Patient Name: Date of Service: ?DA LTO N, MA RV IN B. 01/10/2022 8:00 A M ?Medical Record Number: [id number] ?Patient Account Number: [id number] ?Date of Birth/Sex: Treating RN: ?[birth year] (76 y.o. [name]) Dellie Catholic ?Primary Care Provider: Garret Reddish Other Clinician: ?Referring Provider: ?Treating Provider/Extender: Fredirick Maudlin ?Garret Reddish ?Weeks in Treatment: 0 ?History of Present Illness ?HPI Description: ADMISSION ?01/10/2022 ?This is a 76 year old man with a past medical history notable for type 2 diabetes, peripheral vascular disease, coronary artery disease, congestive heart ?failure, and ulcerative colitis. He is accompanied by his granddaughter who assists in providing the medical history. The patient reports that 2 to 3 weeks ago, ?he noticed a sore on his right second toe. He saw his primary care provider on March 29. They prescribed Augmentin and Bactrim. He saw podiatry the same ?day. Podiatry trimmed his nails and postulated that the breakdown in the second digit was likely due to pressure from his dystrophic great toenail. There was ?also some crusting on the patient's left heel, but there was no breakdown of the skin appreciated. ?Today, I concur that the heel is not open. The right second toe, however  probes to bone and there is exposed fat and tendon. There is slough in the wound. The ?toe itself is not particularly red and there is no purulent drainage or odor. The patient is wearing Crocs. ?Electronic Signature(s) ?Signed: 01/10/2022 9:57:04 AM By: Fredirick Maudlin MD FACS ?Entered By: Fredirick Maudlin on 01/10/2022 09:57:03 ?-------------------------------------------------------------------------------- ?Physical Exam Details ?Patient  Name: Date of Service: ?DA LTO N, MA RV IN B. 01/10/2022 8:00 A M ?Medical Record Number: [id number] ?Patient Account Number: [id number] ?Date of Birth/Sex: Treating RN: ?May 06, 1946 (76 y.o. [name]) Dellie Catholic ?Primary Care Provider: Garret Reddish Other Clinician: ?Referring Provider: ?Treating Provider/Extender: Fredirick Maudlin ?Garret Reddish ?Weeks in Treatment: 0 ?Constitutional ?. . . . No acute distress. ?Respiratory ?Normal work of breathing on room air.Marland Kitchen ?Cardiovascular ?Easily palpable 2+ dorsalis pedis pulses bilaterally.Marland Kitchen ?Notes ?01/10/2022: Wound examon the left heel, there is just some crusted skin and a little bit of scab, nothing is open. On the right second toe, however, there is a ?deep ulcer that matches up exactly with the contour of his great toenail. There is slough and exposed tendon and bone. No purulent drainage. No surrounding ?erythema or induration. ?Electronic Signature(s) ?Signed: 01/10/2022 10:07:17 AM By: Fredirick Maudlin MD FACS ?Entered By: Fredirick Maudlin on 01/10/2022 10:07:17 ?-------------------------------------------------------------------------------- ?Physician Orders Details ?Patient Name: ?Date of Service: ?DA LTO N, MA RV IN B. 01/10/2022 8:00 A M ?Medical Record Number: [id number] ?Patient Account Number: [id number] ?Date of Birth/Sex: ?Treating RN: ?09/04/1946 (76 y.o. 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 - Dr Celine Ahr ?Return A ?Bathing/ Shower/ Hygiene ?May shower and wash wound with soap and water. ?Off-Loading ?Other: - Right off loading shoe ?Wound Treatment ?Wound #1 - T Second ?oe Wound Laterality: Right, Medial ?Cleanser: Wound Cleanser (DME) (Generic) Every Other Day/15 Days ?Discharge Instructions: Cleanse the wound with wound cleanser prior to applying a clean dressing using gauze sponges, not tissue or cotton balls. ?Cleanser: [place] Ancillary Kit - 15 Day Supply (DME) (Generic) Every Other Day/15 Days ?Discharge Instructions: Use supplies as instructed; Kit contains: (15) Saline Bullets; (15) 3x3 Gauze; 15 pr Gloves ?Prim Dressing: Iodosorb Gel 10 (gm) Tube (DME) (Generic) Every Other Day/15 Days ?ary ?Discharge Instructions: Apply to wound bed as instructed ?Secondary Dressing: Woven Gauze Sponges 2x2 in (DME) (Generic) Every Other Day/15 Days ?Discharge Instructions: Apply over primary dressing as directed. ?Secured With: Child psychotherapist, Sterile 2x75 (in/in) (DME) (Generic) Every Other Day/15 Days ?Discharge Instructions: Secure with stretch gauze as directed. ?Secured With: Paper Tape, 2x10 (in/yd) (DME) (Generic) Every Other Day/15 Days ?Discharge Instructions: Secure dressing with tape as directed. 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

## 2022-01-10 NOTE — Progress Notes (Signed)
SHIVAAN, TIERNO ([id number]) ?Visit Report for 01/10/2022 ?Abuse Risk Screen Details ?Patient Name: Date of Service: ?DA LTO N, MA RV IN B. 01/10/2022 8:00 A M ?Medical Record Number: [id number] ?Patient Account Number: [id number] ?Date of Birth/Sex: Treating RN: ?1946-02-25 (76 y.o. [name]) Dellie Catholic ?Primary Care Kodie Pick: Garret Reddish Other Clinician: ?Referring Bazil Dhanani: ?Treating Janika Jedlicka/Extender: Fredirick Maudlin ?Garret Reddish ?Weeks in Treatment: 0 ?Abuse Risk Screen Items ?Answer ?ABUSE RISK SCREEN: ?Has anyone close to you tried to hurt or harm you recentlyo No ?Do you feel uncomfortable with anyone in your familyo No ?Has anyone forced you do things that you didnt want to doo No ?Electronic Signature(s) ?Signed: 01/10/2022 5:04:21 PM By: Dellie Catholic RN ?Entered By: Dellie Catholic on 01/10/2022 08:40:22 ?-------------------------------------------------------------------------------- ?Activities of Daily Living Details ?Patient Name: Date of Service: ?DA LTO N, MA RV IN B. 01/10/2022 8:00 A M ?Medical Record Number: [id number] ?Patient Account Number: [id number] ?Date of Birth/Sex: Treating RN: ?[birth year] (76 y.o. [name]) Dellie Catholic ?Primary Care Shaheim Mahar: Garret Reddish Other Clinician: ?Referring Joette Schmoker: ?Treating Sharmane Dame/Extender: Fredirick Maudlin ?Garret Reddish ?Weeks in Treatment: 0 ?Activities of Daily Living Items ?Answer ?Activities of Daily Living (Please select one for each item) ?[place] ?T Medications ?ake Completely Able ?Use T elephone Completely Able ?Care for Appearance Completely Able ?Use T oilet Completely Able ?Bath / Shower Completely Able ?Dress Self Completely Able ?Feed Self Completely Able ?Walk Completely Able ?Get In / Out Bed Completely Able ?Housework Completely Able ?Prepare Meals Completely Able ?Handle Money Completely Able ?Shop for Self Completely Able ?Electronic Signature(s) ?Signed: 01/10/2022 5:04:21 PM By: Dellie Catholic RN ?Entered By:  Dellie Catholic on 01/10/2022 08:41:13 ?-------------------------------------------------------------------------------- ?Education Screening Details ?Patient Name: ?Date of Service: ?DA LTO N, MA RV IN B. 01/10/2022 8:00 A M ?Medical Record Number: [id number] ?Patient Account Number: [id number] ?Date of Birth/Sex: ?Treating RN: ?Jun 28, 1946 (76 y.o. [name]) Dellie Catholic ?Primary Care Mikaylee Arseneau: Garret Reddish ?Other Clinician: ?Referring Kathyann Spaugh: ?Treating Elvin Mccartin/Extender: Fredirick Maudlin ?Garret Reddish ?Weeks in Treatment: 0 ?Primary Learner Assessed: Patient ?Learning Preferences/Education Level/Primary Language ?Learning Preference: Explanation, Demonstration, Printed Material ?Highest Education Level: College or Above ?Preferred Language: English ?Cognitive Barrier ?Language Barrier: No ?Translator Needed: No ?Memory Deficit: No ?Emotional Barrier: No ?Cultural/Religious Beliefs Affecting Medical Care: No ?Physical Barrier ?Impaired Vision: No ?Impaired Hearing: No ?Decreased Hand dexterity: No ?Knowledge/Comprehension ?Knowledge Level: High ?Comprehension Level: High ?Ability to understand written instructions: High ?Ability to understand verbal instructions: High ?Motivation ?Anxiety Level: Calm ?Cooperation: Cooperative ?Education Importance: Acknowledges Need ?Interest in Health Problems: Asks Questions ?Perception: Coherent ?Willingness to Engage in Self-Management High ?Activities: ?Readiness to Engage in Self-Management High ?Activities: ?Electronic Signature(s) ?Signed: 01/10/2022 5:04:21 PM By: Dellie Catholic RN ?Entered By: Dellie Catholic on 01/10/2022 08:43:03 ?-------------------------------------------------------------------------------- ?Fall Risk Assessment Details ?Patient Name: ?Date of Service: ?DA LTO N, MA RV IN B. 01/10/2022 8:00 A M ?Medical Record Number: [id number] ?Patient Account Number: [id number] ?Date of Birth/Sex: ?Treating RN: ?Apr 15, 1946 (76 y.o. [name]) Dellie Catholic ?Primary Care  Randee Upchurch: Garret Reddish ?Other Clinician: ?Referring Kanna Dafoe: ?Treating Barrie Wale/Extender: Fredirick Maudlin ?Garret Reddish ?Weeks in Treatment: 0 ?Fall Risk Assessment Items ?Have you had 2 or more falls in the last 12 monthso 0 No ?Have you had any fall that resulted in injury in the last 12 monthso 0 No ?FALLS RISK SCREEN ?History of falling - immediate or within 3 months 0 No ?Secondary diagnosis (Do you have 2 or more medical diagnoseso) 0 No ?Ambulatory aid ?None/bed rest/wheelchair/nurse 0 No ?Crutches/cane/walker 0 No ?Furniture 0 No ?Intravenous therapy Access/Saline/Heparin  Lock 0 No ?Gait/Transferring ?Normal/ bed rest/ wheelchair 0 No ?Weak (short steps with or without shuffle, stooped but able to lift head while walking, may seek 0 No ?support from furniture) ?Impaired (short steps with shuffle, may have difficulty arising from chair, head down, impaired 0 No ?balance) ?Mental Status ?Oriented to own ability 0 No ?Electronic Signature(s) ?Signed: 01/10/2022 5:04:21 PM By: Dellie Catholic RN ?Entered By: Dellie Catholic on 01/10/2022 08:43:17 ?-------------------------------------------------------------------------------- ?Foot Assessment Details ?Patient Name: ?Date of Service: ?DA LTO N, MA RV IN B. 01/10/2022 8:00 A M ?Medical Record Number: [id number] ?Patient Account Number: [id number] ?Date of Birth/Sex: ?Treating RN: ?[birth year] (76 y.o. [name]) Dellie Catholic ?Primary Care Coen Miyasato: Garret Reddish ?Other Clinician: ?Referring Colie Fugitt: ?Treating Blu Lori/Extender: Fredirick Maudlin ?Garret Reddish ?Weeks in Treatment: 0 ?Foot Assessment Items ?Site Locations ?+ = Sensation present, - = Sensation absent, C = Callus, U = Ulcer ?R = Redness, W = Warmth, M = Maceration, PU = Pre-ulcerative lesion ?F = Fissure, S = Swelling, D = Dryness ?Assessment ?Right: Left: ?Other Deformity: No No ?Prior Foot Ulcer: No No ?Prior Amputation: No No ?Charcot Joint: No No ?Ambulatory Status: Ambulatory Without Help ?Gait:  Steady ?Electronic Signature(s) ?Signed: 01/10/2022 5:04:21 PM By: Dellie Catholic RN ?Entered By: Dellie Catholic on 01/10/2022 08:47:12 ?-------------------------------------------------------------------------------- ?Nutrition Risk Screening Details ?Patient Name: ?Date of Service: ?DA LTO N, MA RV IN B. 01/10/2022 8:00 A M ?Medical Record Number: [id number] ?Patient Account Number: [id number] ?Date of Birth/Sex: ?Treating RN: ?02/24/1946 (76 y.o. [name]) Dellie Catholic ?Primary Care Ahamed Hofland: Garret Reddish ?Other Clinician: ?Referring Erza Mothershead: ?Treating Jakeya Gherardi/Extender: Fredirick Maudlin ?Garret Reddish ?Weeks in Treatment: 0 ?Height (in): 68 ?Weight (lbs): 225 ?Body Mass Index (BMI): 34.2 ?Nutrition Risk Screening Items ?Score Screening ?NUTRITION RISK SCREEN: ?I have an illness or condition that made me change the kind and/or amount of food I eat 0 No ?I eat fewer than two meals per day 0 No ?I eat few fruits and vegetables, or milk products 0 No ?I have three or more drinks of beer, liquor or wine almost every day 0 No ?I have tooth or mouth problems that make it hard for me to eat 0 No ?I don't always have enough money to buy the food I need 0 No ?I eat alone most of the time 0 No ?I take three or more different prescribed or over-the-counter drugs a day 0 No ?Without wanting to, I have lost or gained 10 pounds in the last six months 0 No ?I am not always physically able to shop, cook and/or feed myself 0 No ?Nutrition Protocols ?Good Risk Protocol 0 No interventions needed ?Moderate Risk Protocol ?High Risk Proctocol ?Risk Level: Good Risk ?Score: 0 ?Electronic Signature(s) ?Signed: 01/10/2022 5:04:21 PM By: Dellie Catholic RN ?Entered By: Dellie Catholic on 01/10/2022 08:43:47 ?

## 2022-01-10 NOTE — Progress Notes (Signed)
[name], [name] ([id number]) ?Visit Report for 01/10/2022 ?Allergy List Details ?Patient Name: Date of Service: ?DA LTO N, MA RV IN B. 01/10/2022 8:00 A M ?Medical Record Number: [id number] ?Patient Account Number: [id number] ?Date of Birth/Sex: Treating RN: ?22-Feb-1946 (76 y.o. [name]) Dellie Catholic ?Primary Care Orilla Templeman: Garret Reddish Other Clinician: ?Referring Galia Rahm: ?Treating Jamarie Joplin/Extender: Fredirick Maudlin ?Garret Reddish ?Weeks in Treatment: 0 ?Allergies ?Active Allergies ?clindamycin ?Severity: Severe ?Allergy Notes ?Electronic Signature(s) ?Signed: 01/10/2022 5:04:21 PM By: Dellie Catholic RN ?Entered By: Dellie Catholic on 01/10/2022 08:17:01 ?-------------------------------------------------------------------------------- ?Arrival Information Details ?Patient Name: Date of Service: ?DA LTO N, MA RV IN B. 01/10/2022 8:00 A M ?Medical Record Number: [id number] ?Patient Account Number: [id number] ?Date of Birth/Sex: Treating RN: ?Jan 06, 1946 (76 y.o. [name]) Dellie Catholic ?Primary Care Ahmed Inniss: Garret Reddish Other Clinician: ?Referring Jameela Michna: ?Treating Demira Gwynne/Extender: Fredirick Maudlin ?Garret Reddish ?Weeks in Treatment: 0 ?Visit Information ?Patient Arrived: Ambulatory ?Arrival Time: 08:05 ?Accompanied By: self ?Transfer Assistance: None ?Patient Identification Verified: Yes ?Patient Has Alerts: Yes ?Patient Alerts: Eliquis ?Electronic Signature(s) ?Signed: 01/10/2022 5:04:21 PM By: Dellie Catholic RN ?Entered By: Dellie Catholic on 01/10/2022 09:04:14 ?-------------------------------------------------------------------------------- ?Clinic Level of Care Assessment Details ?Patient Name: Date of Service: ?DA LTO N, MA RV IN B. 01/10/2022 8:00 A M ?Medical Record Number: [id number] ?Patient Account Number: [id number] ?Date of Birth/Sex: Treating RN: ?[birth year] (76 y.o. [name]) Dellie Catholic ?Primary Care Endiya Klahr: Garret Reddish Other Clinician: ?Referring Kaitlin Ardito: ?Treating Aloysious Vangieson/Extender: Fredirick Maudlin ?Garret Reddish ?Weeks in Treatment: 0 ?Clinic Level of Care Assessment Items ?TOOL 1 Quantity Score ?X- 1 0 ?Use when EandM and Procedure is performed on INITIAL visit ?ASSESSMENTS - Nursing Assessment / Reassessment ?X- 1 20 ?General Physical Exam (combine w/ comprehensive assessment (listed just below) when performed on new pt. evals) ?X- 1 25 ?Comprehensive Assessment (HX, ROS, Risk Assessments, Wounds Hx, etc.) ?ASSESSMENTS - Wound and Skin Assessment / Reassessment ?X- 1 10 ?Dermatologic / Skin Assessment (not related to wound area) ?ASSESSMENTS - Ostomy and/or Continence Assessment and Care ?[]  - 0 ?Incontinence Assessment and Management ?[]  - 0 ?Ostomy Care Assessment and Management (repouching, etc.) ?PROCESS - Coordination of Care ?X - Simple Patient / Family Education for ongoing care 1 15 ?[]  - 0 ?Complex (extensive) Patient / Family Education for ongoing care ?X- 1 10 ?Staff obtains Consents, Records, T Results / Process Orders ?est ?X- 1 10 ?Staff telephones HHA, Nursing Homes / Clarify orders / etc ?[]  - 0 ?Routine Transfer to another Facility (non-emergent condition) ?[]  - 0 ?Routine Hospital Admission (non-emergent condition) ?X- 1 15 ?New Admissions / Biomedical engineer / Ordering NPWT Apligraf, etc. ?, ?[]  - 0 ?Emergency Hospital Admission (emergent condition) ?PROCESS - Special Needs ?[]  - 0 ?Pediatric / Minor Patient Management ?[]  - 0 ?Isolation Patient Management ?[]  - 0 ?Hearing / Language / Visual special needs ?[]  - 0 ?Assessment of Community assistance (transportation, D/C planning, etc.) ?[]  - 0 ?Additional assistance / Altered mentation ?[]  - 0 ?Support Surface(s) Assessment (bed, cushion, seat, etc.) ?INTERVENTIONS - Miscellaneous ?[]  - 0 ?External ear exam ?[]  - 0 ?Patient Transfer (multiple staff / Civil Service fast streamer / Similar devices) ?[]  - 0 ?Simple Staple / Suture removal (25 or less) ?[]  - 0 ?Complex Staple / Suture removal (26 or more) ?[]  - 0 ?Hypo/Hyperglycemic  Management (do not check if billed separately) ?X- 1 15 ?Ankle / Brachial Index (ABI) - do not check if billed separately ?Has the patient been seen at the hospital within the last three years: Yes ?Total Score: 120 ?Level Of Care:  New/Established - Level 4 ?Electronic Signature(s) ?Signed: 01/10/2022 5:04:21 PM By: Dellie Catholic RN ?Entered By: Dellie Catholic on 01/10/2022 16:59:25 ?-------------------------------------------------------------------------------- ?Encounter Discharge Information Details ?Patient Name: ?Date of Service: ?DA LTO N, MA RV IN B. 01/10/2022 8:00 A M ?Medical Record Number: [id number] ?Patient Account Number: [id number] ?Date of Birth/Sex: ?Treating RN: ?[birth year] (76 y.o. [name]) Dellie Catholic ?Primary Care Kiarrah Rausch: Garret Reddish ?Other Clinician: ?Referring Clarene Curran: ?Treating Angell Honse/Extender: Fredirick Maudlin ?Garret Reddish ?Weeks in Treatment: 0 ?Encounter Discharge Information Items Post Procedure Vitals ?Discharge Condition: Stable ?Temperature (F): 98.2 ?Ambulatory Status: Ambulatory ?Pulse (bpm): 75 ?Discharge Destination: Home ?Respiratory Rate (breaths/min): 18 ?Transportation: Private Auto ?Blood Pressure (mmHg): 160/72 ?Accompanied By: Granddaughter ?Schedule Follow-up Appointment: Yes ?Clinical Summary of Care: Patient Declined ?Electronic Signature(s) ?Signed: 01/10/2022 5:04:21 PM By: Dellie Catholic RN ?Entered By: Dellie Catholic on 01/10/2022 17:03:34 ?-------------------------------------------------------------------------------- ?Lower Extremity Assessment Details ?Patient Name: ?Date of Service: ?DA LTO N, MA RV IN B. 01/10/2022 8:00 A M ?Medical Record Number: [id number] ?Patient Account Number: [id number] ?Date of Birth/Sex: ?Treating RN: ?[birth year] (76 y.o. [name]) Dellie Catholic ?Primary Care Yaretzi Ernandez: Garret Reddish ?Other Clinician: ?Referring Cyera Balboni: ?Treating Lanetra Hartley/Extender: Fredirick Maudlin ?Garret Reddish ?Weeks in Treatment: 0 ?Edema Assessment ?Assessed:  [Left: No] [Right: No] ?E[Left: dema] [Right: :] ?Calf ?Left: Right: ?Point of Measurement: 29 cm From Medial Instep 37.2 cm 37.9 cm ?Ankle ?Left: Right: ?Point of Measurement: 9 cm From Medial Instep 25.8 cm 27 cm ?Knee To Floor ?Left: Right: ?From Medial Instep 47 cm 47 cm ?Vascular Assessment ?Blood Pressure: ?Brachial: [Left:113] [Right:113] ?Ankle: ?[Left:Dorsalis Pedis: 110 0.97] ?[Right:Dorsalis Pedis: 140 1.24] ?Electronic Signature(s) ?Signed: 01/10/2022 5:04:21 PM By: Dellie Catholic RN ?Entered By: Dellie Catholic on 01/10/2022 09:03:36 ?-------------------------------------------------------------------------------- ?Multi Wound Chart Details ?Patient Name: ?Date of Service: ?DA LTO N, MA RV IN B. 01/10/2022 8:00 A M ?Medical Record Number: [id number] ?Patient Account Number: [id number] ?Date of Birth/Sex: ?Treating RN: ?11-19-1945 (76 y.o. [name]) Dellie Catholic ?Primary Care Maryssa Giampietro: Garret Reddish ?Other Clinician: ?Referring Albana Saperstein: ?Treating Davinia Riccardi/Extender: Fredirick Maudlin ?Garret Reddish ?Weeks in Treatment: 0 ?Vital Signs ?Height(in): 68 ?Pulse(bpm): 89 ?Weight(lbs): 225 ?Blood Pressure(mmHg): 113/68 ?Body Mass Index(BMI): 34.2 ?Temperature(??F): 97.5 ?Respiratory Rate(breaths/min): 16 ?Photos: [N/A:N/A] ?Right, Medial T Second ?oe Left Malleolus N/A ?Wound Location: ?Gradually Appeared Blister N/A ?Wounding Event: ?Diabetic Wound/Ulcer of the Lower Trauma, Other N/A ?Primary Etiology: ?Extremity ?Sleep Apnea, Angina, Arrhythmia, Sleep Apnea, Angina, Arrhythmia, N/A ?Comorbid History: ?Congestive Heart Failure, Coronary Congestive Heart Failure, Coronary ?Artery Disease, Hypertension, Artery Disease, Hypertension, ?Myocardial Infarction, Colitis, Type II Myocardial Infarction, Colitis, Type II ?Diabetes, Osteoarthritis Diabetes, Osteoarthritis ?12/31/2021 01/05/2022 N/A ?Date Acquired: ?0 0 N/A ?Weeks of Treatment: ?Open Healed - Epithelialized N/A ?Wound Status: ?No No N/A ?Wound  Recurrence: ?0.9x0.8x0.3 0x0x0 N/A ?Measurements L x W x D (cm) ?0.565 0 N/A ?A (cm?) : ?rea ?0.17 0 N/A ?Volume (cm?) : ?0.00% N/A N/A ?% Reduction in A rea: ?0.00% N/A N/A ?% Reduction in Volume: ?12 ?Starting Position 1 (o'clock): ?12 ?End

## 2022-01-11 ENCOUNTER — Other Ambulatory Visit: Payer: Self-pay | Admitting: Internal Medicine

## 2022-01-11 DIAGNOSIS — E1151 Type 2 diabetes mellitus with diabetic peripheral angiopathy without gangrene: Secondary | ICD-10-CM

## 2022-01-15 LAB — AEROBIC/ANAEROBIC CULTURE W GRAM STAIN (SURGICAL/DEEP WOUND)
Culture: NO GROWTH
Gram Stain: NONE SEEN

## 2022-01-17 ENCOUNTER — Encounter (HOSPITAL_BASED_OUTPATIENT_CLINIC_OR_DEPARTMENT_OTHER): Payer: Medicare Other | Admitting: General Surgery

## 2022-01-17 DIAGNOSIS — L97516 Non-pressure chronic ulcer of other part of right foot with bone involvement without evidence of necrosis: Secondary | ICD-10-CM | POA: Diagnosis not present

## 2022-01-17 DIAGNOSIS — K5100 Ulcerative (chronic) pancolitis without complications: Secondary | ICD-10-CM | POA: Diagnosis not present

## 2022-01-17 DIAGNOSIS — E11621 Type 2 diabetes mellitus with foot ulcer: Secondary | ICD-10-CM | POA: Diagnosis not present

## 2022-01-17 DIAGNOSIS — E1151 Type 2 diabetes mellitus with diabetic peripheral angiopathy without gangrene: Secondary | ICD-10-CM | POA: Diagnosis not present

## 2022-01-17 DIAGNOSIS — I25118 Atherosclerotic heart disease of native coronary artery with other forms of angina pectoris: Secondary | ICD-10-CM | POA: Diagnosis not present

## 2022-01-17 DIAGNOSIS — I5042 Chronic combined systolic (congestive) and diastolic (congestive) heart failure: Secondary | ICD-10-CM | POA: Diagnosis not present

## 2022-01-18 NOTE — Progress Notes (Signed)
JACOBS, GOLAB ([id number]) ?Visit Report for 01/17/2022 ?Chief Complaint Document Details ?Patient Name: Date of Service: ?DA [name], [place] RV IN B. 01/17/2022 3:15 PM ?Medical Record Number: [id number] ?Patient Account Number: [id number] ?Date of Birth/Sex: Treating RN: ?1946-02-20 (76 y.o. [name]) Dellie Catholic ?Primary Care Provider: Garret Reddish Other Clinician: ?Referring Provider: ?Treating Provider/Extender: [name] ?Garret Reddish ?Weeks in Treatment: 1 ?Information Obtained from: Patient ?Chief Complaint ?Patients presents for treatment of an open diabetic ulcer ?Electronic Signature(s) ?Signed: 01/17/2022 4:17:17 PM By: Andrew Maudlin MD FACS ?Entered By: [name] on 01/17/2022 16:17:17 ?-------------------------------------------------------------------------------- ?Debridement Details ?Patient Name: Date of Service: ?DA [name], [place] RV IN B. 01/17/2022 3:15 PM ?Medical Record Number: [id number] ?Patient Account Number: [id number] ?Date of Birth/Sex: Treating RN: ?05-19-1946 (76 y.o. [name] ?Primary Care Provider: Garret Reddish Other Clinician: ?Referring Provider: ?Treating Provider/Extender: [name] ?Garret Reddish ?Weeks in Treatment: 1 ?Debridement Performed for Assessment: Wound #1 Right,Medial T Second ?oe ?Performed By: Physician Andrew Maudlin, MD ?Debridement Type: Debridement ?Severity of Tissue Pre Debridement: Bone involvement without necrosis ?Level of Consciousness (Pre-procedure): Awake and Alert ?Pre-procedure Verification/Time Out Yes - 16:00 ?Taken: ?Start Time: 16:04 ?Pain Control: Lidocaine 4% T opical Solution ?T Area Debrided (L x W): ?otal 0.8 (cm) x 0.7 (cm) = 0.56 (cm?) ?Tissue and other material debrided: Viable, Non-Viable, Slough, Subcutaneous, Tendon, Slough ?Level: Skin/Subcutaneous Tissue/Muscle ?Debridement Description: Excisional ?Instrument: Curette ?Bleeding: Minimum ?Procedural Pain: 2 ?Post Procedural Pain: 0 ?Response to Treatment:  Procedure was tolerated well ?Level of Consciousness (Post- Awake and Alert ?procedure): ?Post Debridement Measurements of Total Wound ?Length: (cm) 0.8 ?Width: (cm) 0.7 ?Depth: (cm) 0.3 ?Volume: (cm?) 0.132 ?Character of Wound/Ulcer Post Debridement: Improved ?Severity of Tissue Post Debridement: Bone involvement without necrosis ?Post Procedure Diagnosis ?Same as Pre-procedure ?Electronic Signature(s) ?Signed: 01/17/2022 5:03:00 PM By: Andrew Maudlin MD FACS ?Signed: 01/17/2022 6:48:31 PM By: Andrew Gouty RN, BSN ?Entered By: [name] on 01/17/2022 16:06:48 ?-------------------------------------------------------------------------------- ?HPI Details ?Patient Name: Date of Service: ?DA [name], [place] RV IN B. 01/17/2022 3:15 PM ?Medical Record Number: [id number] ?Patient Account Number: [id number] ?Date of Birth/Sex: Treating RN: ?01/04/1946 (76 y.o. [name]) Dellie Catholic ?Primary Care Provider: Garret Reddish Other Clinician: ?Referring Provider: ?Treating Provider/Extender: [name] ?Garret Reddish ?Weeks in Treatment: 1 ?History of Present Illness ?HPI Description: ADMISSION ?01/10/2022 ?This is a 76 year old man with a past medical history notable for type 2 diabetes, peripheral vascular disease, coronary artery disease, congestive heart ?failure, and ulcerative colitis. He is accompanied by his granddaughter who assists in providing the medical history. The patient reports that 2 to 3 weeks ago, ?he noticed a sore on his right second toe. He saw his primary care provider on March 29. They prescribed Augmentin and Bactrim. He saw podiatry the same ?day. Podiatry trimmed his nails and postulated that the breakdown in the second digit was likely due to pressure from his dystrophic great toenail. There was ?also some crusting on the patient's left heel, but there was no breakdown of the skin appreciated. ?Today, I concur that the heel is not open. The right second toe, however probes to bone and there is  exposed fat and tendon. There is slough in the wound. The ?toe itself is not particularly red and there is no purulent drainage or odor. The patient is wearing Crocs. ?01/17/2022: Today, the bone is more obviously exposed, but the wound itself is cleaner. Minimal slough. Tendon is exposed and is nonviable. X-ray done last ?week was negative for osteomyelitis and culture did not  grow out any organisms. ?Electronic Signature(s) ?Signed: 01/17/2022 4:23:50 PM By: Andrew Maudlin MD FACS ?Entered By: [name] on 01/17/2022 16:23:50 ?-------------------------------------------------------------------------------- ?Physical Exam Details ?Patient Name: Date of Service: ?DA [name], [place] RV IN B. 01/17/2022 3:15 PM ?Medical Record Number: [id number] ?Patient Account Number: [id number] ?Date of Birth/Sex: Treating RN: ?11-18-1945 (76 y.o. [name]) Dellie Catholic ?Primary Care Provider: Garret Reddish Other Clinician: ?Referring Provider: ?Treating Provider/Extender: [name] ?Garret Reddish ?Weeks in Treatment: 1 ?Constitutional ?. Bradycardic, asymptomatic.. . . No acute distress. ?Respiratory ?Normal work of breathing on room air. ?Notes ?01/17/2022: On the right second toe, the ulcer is cleaner with markedly less slough. The bone is frankly exposed along with some overlying tendon. The toe is ?less edematous and there is no erythema or purulent drainage. ?Electronic Signature(s) ?Signed: 01/17/2022 4:25:32 PM By: Andrew Maudlin MD FACS ?Entered By: [name] on 01/17/2022 16:25:32 ?-------------------------------------------------------------------------------- ?Physician Orders Details ?Patient Name: ?Date of Service: ?DA Andrew Konig, MA RV IN B. 01/17/2022 3:15 PM ?Medical Record Number: [id number] ?Patient Account Number: [id number] ?Date of Birth/Sex: ?Treating RN: ?11/04/1945 (76 y.o. 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 - Dr Celine Ahr Rm 3 ?Return A ?Bathing/ Shower/ Hygiene ?May shower and wash wound with soap and water. ?Off-Loading ?Other: - Right off loading shoe ?Wound Treatment ?Wound #1 - T Second ?oe Wound Laterality: Right, Medial ?Cleanser: Wound Cleanser (Generic) Every Other Day/15 Days ?Discharge Instructions: Cleanse the wound with wound cleanser prior to applying a clean dressing using gauze sponges, not tissue or cotton balls. ?Prim Dressing: Iodosorb Gel 10 (gm) Tube (Generic) Every Other Day/15 Days ?ary ?Discharge Instructions: Apply to wound bed as instructed ?Secondary Dressing: Woven Gauze Sponges 2x2 in (Generic) Every Other Day/15 Days ?Discharge Instructions: Apply over primary dressing as directed. ?Secured With: Child psychotherapist, Sterile 2x75 (in/in) (Generic) Every Other Day/15 Days ?Discharge Instructions: Secure with stretch gauze as directed. ?Secured With: Paper Tape, 2x10 (in/yd) (Generic) Every Other Day/15 Days ?Discharge Instructions: Secure dressing with tape as directed. ?Electronic Signature(s) ?Signed: 01/17/2022 5:03:00 PM By: Andrew Maudlin MD FACS ?Entered By: [name] on 01/17/2022 16:29:41 ?-------------------------------------------------------------------------------- ?Problem List  Details ?Patient Name: Date of Service: ?DA [name], [place] RV IN B. 01/17/2022 3:15 PM ?Medical Record Number: [id number] ?Patient Account Number: [id number] ?Date of Birth/Sex: Treating RN: ?Jun 17, 1946 (76 y.o. [name]) Dellie Catholic

## 2022-01-18 NOTE — Progress Notes (Signed)
MANNY, VITOLO ([id number]) ?Visit Report for 01/17/2022 ?Arrival Information Details ?Patient Name: Date of Service: ?[name], [place] IN B. 01/17/2022 3:15 PM ?Medical Record Number: [id number] ?Patient Account Number: [id number] ?Date of Birth/Sex: Treating RN: ?1945/12/19 (76 y.o. [name]) Dellie Catholic ?Primary Care Myndi Wamble: Garret Reddish Other Clinician: ?Referring Mackenna Kamer: ?Treating Sedric Guia/Extender: Fredirick Maudlin ?Garret Reddish ?Weeks in Treatment: 1 ?Visit Information History Since Last Visit ?Added or deleted any medications: No ?Patient Arrived: Kasandra Knudsen ?Any new allergies or adverse reactions: No ?Arrival Time: 15:13 ?Had a fall or experienced change in No ?Accompanied By: grand daughter ?activities of daily living that may affect ?Transfer Assistance: None ?risk of falls: ?Patient Identification Verified: Yes ?Signs or symptoms of abuse/neglect since last visito No ?Secondary Verification Process Completed: Yes ?Hospitalized since last visit: No ?Patient Has Alerts: Yes ?Implantable device outside of the clinic excluding No ?Patient Alerts: Eliquis ?cellular tissue based products placed in the center ?since last visit: ?Has Dressing in Place as Prescribed: Yes ?Pain Present Now: No ?Electronic Signature(s) ?Signed: 01/17/2022 5:39:08 PM By: Adline Peals ?Entered By: Adline Peals on 01/17/2022 15:19:10 ?-------------------------------------------------------------------------------- ?Encounter Discharge Information Details ?Patient Name: Date of Service: ?[name], [place] B. 01/17/2022 3:15 PM ?Medical Record Number: [id number] ?Patient Account Number: [id number] ?Date of Birth/Sex: Treating RN: ?November 19, 1945 (76 y.o. [name] ?Primary Care Ahava Kissoon: Garret Reddish Other Clinician: ?Referring Zell Hylton: ?Treating Adylene Dlugosz/Extender: Fredirick Maudlin ?Garret Reddish ?Weeks in Treatment: 1 ?Encounter Discharge Information Items Post Procedure Vitals ?Discharge Condition: Stable ?Temperature  (F): 97.4 ?Ambulatory Status: Ambulatory ?Pulse (bpm): 98 ?Discharge Destination: Home ?Respiratory Rate (breaths/min): 18 ?Transportation: Private Auto ?Blood Pressure (mmHg): 110/65 ?Accompanied By: spouse ?Schedule Follow-up Appointment: Yes ?Clinical Summary of Care: Patient Declined ?Electronic Signature(s) ?Signed: 01/17/2022 6:48:31 PM By: Baruch Gouty RN, BSN ?Entered By: Baruch Gouty on 01/17/2022 18:47:24 ?-------------------------------------------------------------------------------- ?Lower Extremity Assessment Details ?Patient Name: ?Date of Service: ?[name], [place] B. 01/17/2022 3:15 PM ?Medical Record Number: [id number] ?Patient Account Number: [id number] ?Date of Birth/Sex: ?Treating RN: ?January 28, 1946 (76 y.o. [name]) Dellie Catholic ?Primary Care Durwood Dittus: Garret Reddish ?Other Clinician: ?Referring Major Santerre: ?Treating Mileigh Tilley/Extender: Fredirick Maudlin ?Garret Reddish ?Weeks in Treatment: 1 ?Edema Assessment ?Assessed: [Left: No] [Right: No] ?E[Left: dema] [Right: :] ?Calf ?Left: Right: ?Point of Measurement: 29 cm From Medial Instep 37.2 cm 38.7 cm ?Ankle ?Left: Right: ?Point of Measurement: 9 cm From Medial Instep 25.8 cm 27.8 cm ?Vascular Assessment ?Pulses: ?Dorsalis Pedis ?Palpable: [Right:Yes] ?Electronic Signature(s) ?Signed: 01/17/2022 5:39:08 PM By: Adline Peals ?Signed: 01/18/2022 5:20:02 PM By: Dellie Catholic RN ?Entered By: Adline Peals on 01/17/2022 15:54:07 ?-------------------------------------------------------------------------------- ?Multi Wound Chart Details ?Patient Name: ?Date of Service: ?[name], [place] B. 01/17/2022 3:15 PM ?Medical Record Number: [id number] ?Patient Account Number: [id number] ?Date of Birth/Sex: ?Treating RN: ?1945/12/14 (76 y.o. [name]) Dellie Catholic ?Primary Care Caidynce Muzyka: Garret Reddish ?Other Clinician: ?Referring Kenadee Gates: ?Treating Elizibeth Breau/Extender: Fredirick Maudlin ?Garret Reddish ?Weeks in Treatment: 1 ?Vital Signs ?Height(in):  68 ?Pulse(bpm): 58 ?Weight(lbs): 225 ?Blood Pressure(mmHg): 110/65 ?Body Mass Index(BMI): 34.2 ?Temperature(??F): 97.4 ?Respiratory Rate(breaths/min): 16 ?Photos: [1:Right, Medial T Second oe] [N/A:N/A N/A] ?Wound Location: [1:Gradually Appeared] [N/A:N/A] ?Wounding Event: [1:Diabetic Wound/Ulcer of the Lower] [N/A:N/A] ?Primary Etiology: [1:Extremity Sleep Apnea, Angina, Arrhythmia,] [N/A:N/A] ?Comorbid History: [1:Congestive Heart Failure, Coronary Artery Disease, Hypertension, Myocardial Infarction, Colitis, Type II Diabetes, Osteoarthritis 12/31/2021] [N/A:N/A] ?Date Acquired: [1:1] [N/A:N/A] ?Weeks of Treatment: [1:Open] [N/A:N/A] ?Wound Status: [1:No] [N/A:N/A] ?Wound Recurrence: [1:0.8x0.7x0.3] [N/A:N/A] ?Measurements L x W x D (cm) [1:0.44] [N/A:N/A] ?A (cm?) : ?rea [1:0.132] [  N/A:N/A] ?Volume (cm?) : [1:22.10%] [N/A:N/A] ?% Reduction in A [1:rea: 22.40%] [N/A:N/A] ?% Reduction in Volume: [1:Grade 3] [N/A:N/A] ?Classification: [1:Medium] [N/A:N/A] ?Exudate A mount: [1:Serosanguineous] [N/A:N/A] ?Exudate Type: [1:red, brown] [N/A:N/A] ?Exudate Color: [1:Distinct, outline attached] [N/A:N/A] ?Wound Margin: [1:Small (1-33%)] [N/A:N/A] ?Granulation A mount: [1:Red, Pink] [N/A:N/A] ?Granulation Quality: [1:Large (67-100%)] [N/A:N/A] ?Necrotic A mount: ?[1:Fat Layer (Subcutaneous Tissue): Yes N/A] ?Exposed Structures: ?[1:Fascia: No Tendon: No Muscle: No Joint: No Bone: No Small (1-33%)] [N/A:N/A] ?Epithelialization: [1:Debridement - Excisional] [N/A:N/A] ?Debridement: ?Pre-procedure Verification/Time Out 16:00 [N/A:N/A] ?Taken: [1:Lidocaine 4% Topical Solution] [N/A:N/A] ?Pain Control: [1:Tendon, Subcutaneous, Slough] [N/A:N/A] ?Tissue Debrided: [1:Skin/Subcutaneous Tissue/Muscle] [N/A:N/A] ?Level: [1:0.56] [N/A:N/A] ?Debridement A (sq cm): [1:rea Curette] [N/A:N/A] ?Instrument: [1:Minimum] [N/A:N/A] ?Bleeding: [1:2] [N/A:N/A] ?Procedural Pain: [1:0] [N/A:N/A] ?Post Procedural Pain: [1:Procedure was tolerated  well] [N/A:N/A] ?Debridement Treatment Response: [1:0.8x0.7x0.3] [N/A:N/A] ?Post Debridement Measurements L x ?W x D (cm) [1:0.132] [N/A:N/A] ?Post Debridement Volume: (cm?) [1:Debridement] [N/A:N/A] ?Treatment Notes ?Electronic Signature(s) ?Signed: 01/17/2022 4:16:53 PM By: Fredirick Maudlin MD FACS ?Signed: 01/18/2022 5:20:02 PM By: Dellie Catholic RN ?Entered By: Fredirick Maudlin on 01/17/2022 16:16:53 ?-------------------------------------------------------------------------------- ?Multi-Disciplinary Care Plan Details ?Patient Name: ?Date of Service: ?[name], [place] B. 01/17/2022 3:15 PM ?Medical Record Number: [id number] ?Patient Account Number: [id number] ?Date of Birth/Sex: ?Treating RN: ?[birth year] (76 y.o. [name]) Dellie Catholic ?Primary Care Reyes Aldaco: Garret Reddish ?Other Clinician: ?Referring Janette Harvie: ?Treating Jaira Canady/Extender: Fredirick Maudlin ?Garret Reddish ?Weeks in Treatment: 1 ?Active Inactive ?Wound/Skin Impairment ?Nursing Diagnoses: ?Impaired tissue integrity ?Goals: ?Patient/caregiver will verbalize understanding of skin care regimen ?Date Initiated: 01/10/2022 ?Target Resolution Date: 03/02/2022 ?Goal Status: Active ?Interventions: ?Assess patient/caregiver ability to obtain necessary supplies ?Provide education on ulcer and skin care ?Notes: ?Electronic Signature(s) ?Signed: 01/17/2022 5:39:08 PM By: Adline Peals ?Signed: 01/18/2022 5:20:02 PM By: Dellie Catholic RN ?Entered By: Adline Peals on 01/17/2022 15:57:11 ?-------------------------------------------------------------------------------- ?Pain Assessment Details ?Patient Name: ?Date of Service: ?[name], [place] B. 01/17/2022 3:15 PM ?Medical Record Number: [id number] ?Patient Account Number: [id number] ?Date of Birth/Sex: ?Treating RN: ?Nov 27, 1945 (76 y.o. [name]) Dellie Catholic ?Primary Care Benjerman Molinelli: Garret Reddish ?Other Clinician: ?Referring Jahmere Bramel: ?Treating Zakaiya Lares/Extender: Fredirick Maudlin ?Garret Reddish ?Weeks in  Treatment: 1 ?Active Problems ?Location of Pain Severity and Description of Pain ?Patient Has Paino No ?Site Locations ?Pain Management and Medication ?Current Pain Management: ?Electronic Signature(s) ?Signed: 01/18/19

## 2022-01-24 ENCOUNTER — Other Ambulatory Visit (HOSPITAL_BASED_OUTPATIENT_CLINIC_OR_DEPARTMENT_OTHER): Payer: Self-pay | Admitting: General Surgery

## 2022-01-24 ENCOUNTER — Encounter (HOSPITAL_BASED_OUTPATIENT_CLINIC_OR_DEPARTMENT_OTHER): Payer: Medicare Other | Admitting: General Surgery

## 2022-01-24 DIAGNOSIS — E1151 Type 2 diabetes mellitus with diabetic peripheral angiopathy without gangrene: Secondary | ICD-10-CM | POA: Diagnosis not present

## 2022-01-24 DIAGNOSIS — M19071 Primary osteoarthritis, right ankle and foot: Secondary | ICD-10-CM | POA: Diagnosis not present

## 2022-01-24 DIAGNOSIS — I25118 Atherosclerotic heart disease of native coronary artery with other forms of angina pectoris: Secondary | ICD-10-CM | POA: Diagnosis not present

## 2022-01-24 DIAGNOSIS — I5042 Chronic combined systolic (congestive) and diastolic (congestive) heart failure: Secondary | ICD-10-CM | POA: Diagnosis not present

## 2022-01-24 DIAGNOSIS — L97516 Non-pressure chronic ulcer of other part of right foot with bone involvement without evidence of necrosis: Secondary | ICD-10-CM | POA: Diagnosis not present

## 2022-01-24 DIAGNOSIS — L97514 Non-pressure chronic ulcer of other part of right foot with necrosis of bone: Secondary | ICD-10-CM | POA: Diagnosis not present

## 2022-01-24 DIAGNOSIS — E11621 Type 2 diabetes mellitus with foot ulcer: Secondary | ICD-10-CM | POA: Diagnosis not present

## 2022-01-24 DIAGNOSIS — K5100 Ulcerative (chronic) pancolitis without complications: Secondary | ICD-10-CM | POA: Diagnosis not present

## 2022-01-24 DIAGNOSIS — M86171 Other acute osteomyelitis, right ankle and foot: Secondary | ICD-10-CM | POA: Diagnosis not present

## 2022-01-24 NOTE — Progress Notes (Addendum)
MINARD, MILLIRONS ([id number]) ?Visit Report for 01/24/2022 ?Arrival Information Details ?Patient Name: Date of Service: ?[name], [place] B. 01/24/2022 2:45 PM ?Medical Record Number: [id number] ?Patient Account Number: [id number] ?Date of Birth/Sex: Treating RN: ?30-Aug-1946 (76 y.o. [name] ?Primary Care Corban Kistler: Garret Reddish Other Clinician: ?Referring Vrinda Heckstall: ?Treating Srishti Strnad/Extender: Fredirick Maudlin ?Garret Reddish ?Weeks in Treatment: 2 ?Visit Information History Since Last Visit ?Added or deleted any medications: No ?Patient Arrived: Ambulatory ?Any new allergies or adverse reactions: No ?Arrival Time: 15:11 ?Had a fall or experienced change in No ?Accompanied By: daughter ?activities of daily living that may affect ?Transfer Assistance: None ?risk of falls: ?Patient Identification Verified: Yes ?Signs or symptoms of abuse/neglect since last visito No ?Secondary Verification Process Completed: Yes ?Hospitalized since last visit: No ?Patient Requires Transmission-Based Precautions: No ?Implantable device outside of the clinic excluding No ?Patient Has Alerts: Yes ?cellular tissue based products placed in the center ?Patient Alerts: Eliquis since last visit: ?Has Dressing in Place as Prescribed: Yes ?Pain Present Now: No ?Electronic Signature(s) ?Signed: 01/24/2022 5:24:08 PM By: Baruch Gouty RN, BSN ?Entered By: Baruch Gouty on 01/24/2022 15:13:53 ?-------------------------------------------------------------------------------- ?Encounter Discharge Information Details ?Patient Name: Date of Service: ?[name], [place] B. 01/24/2022 2:45 PM ?Medical Record Number: [id number] ?Patient Account Number: [id number] ?Date of Birth/Sex: Treating RN: ?1945-11-18 (76 y.o. [name] ?Primary Care Courtny Bennison: Garret Reddish Other Clinician: ?Referring Caidynce Muzyka: ?Treating Lugene Beougher/Extender: Fredirick Maudlin ?Garret Reddish ?Weeks in Treatment: 2 ?Encounter Discharge Information Items Post  Procedure Vitals ?Discharge Condition: Stable ?Temperature (F): 97.9 ?Ambulatory Status: Ambulatory ?Pulse (bpm): 79 ?Discharge Destination: Home ?Respiratory Rate (breaths/min): 18 ?Transportation: Private Auto ?Blood Pressure (mmHg): 110/75 ?Accompanied By: daughter ?Schedule Follow-up Appointment: Yes ?Clinical Summary of Care: Patient Declined ?Electronic Signature(s) ?Signed: 01/24/2022 5:24:08 PM By: Baruch Gouty RN, BSN ?Entered By: Baruch Gouty on 01/24/2022 16:03:44 ?-------------------------------------------------------------------------------- ?Lower Extremity Assessment Details ?Patient Name: ?Date of Service: ?[name], [place] B. 01/24/2022 2:45 PM ?Medical Record Number: [id number] ?Patient Account Number: [id number] ?Date of Birth/Sex: ?Treating RN: ?Feb 25, 1946 (76 y.o. [name] ?Primary Care Lenon Kuennen: Garret Reddish ?Other Clinician: ?Referring Gershom Brobeck: ?Treating Mannie Wineland/Extender: Fredirick Maudlin ?Garret Reddish ?Weeks in Treatment: 2 ?Edema Assessment ?Assessed: [Left: No] [Right: No] ?E[Left: dema] [Right: :] ?Calf ?Left: Right: ?Point of Measurement: 29 cm From Medial Instep 39.5 cm ?Ankle ?Left: Right: ?Point of Measurement: 9 cm From Medial Instep 28 cm ?Vascular Assessment ?Pulses: ?Dorsalis Pedis ?Palpable: [Right:Yes] ?Electronic Signature(s) ?Signed: 01/24/2022 5:24:08 PM By: Baruch Gouty RN, BSN ?Entered By: Baruch Gouty on 01/24/2022 15:21:30 ?-------------------------------------------------------------------------------- ?Multi Wound Chart Details ?Patient Name: ?Date of Service: ?[name], [place] B. 01/24/2022 2:45 PM ?Medical Record Number: [id number] ?Patient Account Number: [id number] ?Date of Birth/Sex: ?Treating RN: ?11/30/1945 (76 y.o. M) ?Primary Care Ahriana Gunkel: Garret Reddish ?Other Clinician: ?Referring Alline Pio: ?Treating Sahmir Weatherbee/Extender: Fredirick Maudlin ?Garret Reddish ?Weeks in Treatment: 2 ?Vital Signs ?Height(in): 68 ?Capillary Blood  Glucose(mg/dl): 118 ?Weight(lbs): 225 ?Pulse(bpm): 79 ?Body Mass Index(BMI): 34.2 ?Blood Pressure(mmHg): 110/75 ?Temperature(??F): 97.9 ?Respiratory Rate(breaths/min): 18 ?Photos: [1:Right, Medial T Second oe] [N/A:N/A N/A] ?Wound Location: [1:Gradually Appeared] [N/A:N/A] ?Wounding Event: [1:Diabetic Wound/Ulcer of the Lower] [N/A:N/A] ?Primary Etiology: [1:Extremity Sleep Apnea, Angina, Arrhythmia,] [N/A:N/A] ?Comorbid History: [1:Congestive Heart Failure, Coronary Artery Disease, Hypertension, Myocardial Infarction, Colitis, Type II Diabetes, Osteoarthritis 12/31/2021] [N/A:N/A] ?Date Acquired: [1:2] [N/A:N/A] ?Weeks of Treatment: [1:Open] [N/A:N/A] ?Wound Status: [1:No] [N/A:N/A] ?Wound Recurrence: [1:0.9x0.5x1.2] [N/A:N/A] ?Measurements L x W x D (cm) [1:0.353] [N/A:N/A] ?A (cm?) : ?rea [1:0.424] [N/A:N/A] ?Volume (  cm?) : [1:37.50%] [N/A:N/A] ?% Reduction in A [1:rea: -149.40%] [N/A:N/A] ?% Reduction in Volume: [1:12] ?Starting Position 1 (o'clock): [1:12] ?Ending Position 1 (o'clock): [1:0.2] ?Maximum Distance 1 (cm): [1:Yes] [N/A:N/A] ?Undermining: [1:Grade 3] [N/A:N/A] ?Classification: [1:Medium] [N/A:N/A] ?Exudate A mount: [1:Serosanguineous] [N/A:N/A] ?Exudate Type: [1:red, brown] [N/A:N/A] ?Exudate Color: [1:Distinct, outline attached] [N/A:N/A] ?Wound Margin: [1:Medium (34-66%)] [N/A:N/A] ?Granulation A mount: [1:Red, Pink] [N/A:N/A] ?Granulation Quality: [1:Medium (34-66%)] [N/A:N/A] ?Necrotic A mount: ?[1:Fat Layer (Subcutaneous Tissue): Yes N/A] ?Exposed Structures: ?[1:Joint: Yes Bone: Yes Fascia: No Tendon: No Muscle: No Small (1-33%)] [N/A:N/A] ?Epithelialization: [1:Debridement - Excisional] [N/A:N/A] ?Debridement: ?Pre-procedure Verification/Time Out 15:30 [N/A:N/A] ?Taken: [1:Lidocaine 4% Topical Solution] [N/A:N/A] ?Pain Control: [1:Bone, Subcutaneous] [N/A:N/A] ?Tissue Debrided: [1:Skin/Subcutaneous] [N/A:N/A] ?Level: [1:Tissue/Muscle/Bone 0.45] [N/A:N/A] ?Debridement A (sq cm): [1:rea Rongeur]  [N/A:N/A] ?Instrument: [1:Minimum] [N/A:N/A] ?Bleeding: [1:Pressure] [N/A:N/A] ?Hemostasis Achieved: [1:3] [N/A:N/A] ?Procedural Pain: [1:2] [N/A:N/A] ?Post Procedural Pain: ?Debridement Treatment Response: Procedure was tolerated well [N/A:N/A] ?Post Debridement Measurements L x 0.9x0.5x1.2 [N/A:N/A] ?W x D (cm) [1:0.424] [N/A:N/A] ?Post Debridement Volume: (cm?) [1:Debridement] [N/A:N/A] ?Treatment Notes ?Electronic Signature(s) ?Signed: 01/24/2022 3:57:21 PM By: Fredirick Maudlin MD FACS ?Entered By: Fredirick Maudlin on 01/24/2022 15:57:21 ?-------------------------------------------------------------------------------- ?Multi-Disciplinary Care Plan Details ?Patient Name: ?Date of Service: ?[name], [place] B. 01/24/2022 2:45 PM ?Medical Record Number: [id number] ?Patient Account Number: [id number] ?Date of Birth/Sex: ?Treating RN: ?1946/01/13 (75 y.o. [name] ?Primary Care Kayren Holck: Garret Reddish ?Other Clinician: ?Referring Francisca Harbuck: ?Treating Shawnika Pepin/Extender: Fredirick Maudlin ?Garret Reddish ?Weeks in Treatment: 2 ?Multidisciplinary Care Plan reviewed with physician ?Active Inactive ?Electronic Signature(s) ?Signed: 01/27/2022 5:13:15 PM By: Baruch Gouty RN, BSN ?Previous Signature: 01/24/2022 5:24:08 PM Version By: Baruch Gouty RN, BSN ?Entered By: Baruch Gouty on 01/27/2022 16:40:25 ?-------------------------------------------------------------------------------- ?Pain Assessment Details ?Patient Name: ?Date of Service: ?[name], [place] B. 01/24/2022 2:45 PM ?Medical Record Number: [id number] ?Patient Account Number: [id number] ?Date of Birth/Sex: ?Treating RN: ?[birth year] (76 y.o. [name] ?Primary Care Maeleigh Buschman: Garret Reddish ?Other Clinician: ?Referring Marlyce Mcdougald: ?Treating Garlene Apperson/Extender: Fredirick Maudlin ?Garret Reddish ?Weeks in Treatment: 2 ?Active Problems ?Location of Pain Severity and Description of Pain ?Patient Has Paino No ?Site Locations ?Rate the pain. ?Current  Pain Level: 0 ?Character of Pain ?Describe the Pain: Tender ?Pain Management and Medication ?Current Pain Management: ?Electronic Signature(s) ?Signed: 01/24/2022 5:24:08 PM By: Baruch Gouty RN, BSN ?Entered By: Lesly Rubenstein

## 2022-01-24 NOTE — Progress Notes (Signed)
KIMM, UNGARO ([id number]) ?Visit Report for 01/24/2022 ?Chief Complaint Document Details ?Patient Name: Date of Service: ?[name], [place] IN B. 01/24/2022 2:45 PM ?Medical Record Number: [id number] ?Patient Account Number: [id number] ?Date of Birth/Sex: Treating RN: ?Oct 27, 1945 (76 y.o. M) ?Primary Care Provider: Garret Reddish Other Clinician: ?Referring Provider: ?Treating Provider/Extender: Fredirick Maudlin ?Garret Reddish ?Weeks in Treatment: 2 ?Information Obtained from: Patient ?Chief Complaint ?Patients presents for treatment of an open diabetic ulcer ?Electronic Signature(s) ?Signed: 01/24/2022 3:57:35 PM By: Fredirick Maudlin MD FACS ?Entered By: Fredirick Maudlin on 01/24/2022 15:57:34 ?-------------------------------------------------------------------------------- ?Debridement Details ?Patient Name: Date of Service: ?[name], [place] B. 01/24/2022 2:45 PM ?Medical Record Number: [id number] ?Patient Account Number: [id number] ?Date of Birth/Sex: Treating RN: ?1945-11-13 (76 y.o. [name] ?Primary Care Provider: Garret Reddish Other Clinician: ?Referring Provider: ?Treating Provider/Extender: Fredirick Maudlin ?Garret Reddish ?Weeks in Treatment: 2 ?Debridement Performed for Assessment: Wound #1 Right,Medial T Second ?oe ?Performed By: Physician Fredirick Maudlin, MD ?Debridement Type: Debridement ?Severity of Tissue Pre Debridement: Necrosis of bone ?Level of Consciousness (Pre-procedure): Awake and Alert ?Pre-procedure Verification/Time Out Yes - 15:30 ?Taken: ?Start Time: 15:30 ?Pain Control: Lidocaine 4% T opical Solution ?T Area Debrided (L x W): ?otal 0.9 (cm) x 0.5 (cm) = 0.45 (cm?) ?Tissue and other material debrided: Viable, Non-Viable, Bone, Subcutaneous ?Level: Skin/Subcutaneous Tissue/Muscle/Bone ?Debridement Description: Excisional ?Instrument: Rongeur ?Specimen: Tissue Culture ?Number of Specimens T aken: 2 ?Bleeding: Minimum ?Hemostasis Achieved: Pressure ?Procedural Pain: 3 ?Post  Procedural Pain: 2 ?Response to Treatment: Procedure was tolerated well ?Level of Consciousness (Post- Awake and Alert ?procedure): ?Post Debridement Measurements of Total Wound ?Length: (cm) 0.9 ?Width: (cm) 0.5 ?Depth: (cm) 1.2 ?Volume: (cm?) 0.424 ?Character of Wound/Ulcer Post Debridement: Requires Further Debridement ?Severity of Tissue Post Debridement: Necrosis of bone ?Post Procedure Diagnosis ?Same as Pre-procedure ?Electronic Signature(s) ?Signed: 01/24/2022 4:15:42 PM By: Fredirick Maudlin MD FACS ?Signed: 01/24/2022 5:24:08 PM By: Baruch Gouty RN, BSN ?Entered By: Baruch Gouty on 01/24/2022 15:41:13 ?-------------------------------------------------------------------------------- ?HPI Details ?Patient Name: Date of Service: ?[name], [place] B. 01/24/2022 2:45 PM ?Medical Record Number: [id number] ?Patient Account Number: [id number] ?Date of Birth/Sex: Treating RN: ?12-03-1945 (76 y.o. M) ?Primary Care Provider: Garret Reddish Other Clinician: ?Referring Provider: ?Treating Provider/Extender: Fredirick Maudlin ?Garret Reddish ?Weeks in Treatment: 2 ?History of Present Illness ?HPI Description: ADMISSION ?01/10/2022 ?This is a 76 year old man with a past medical history notable for type 2 diabetes, peripheral vascular disease, coronary artery disease, congestive heart ?failure, and ulcerative colitis. He is accompanied by his granddaughter who assists in providing the medical history. The patient reports that 2 to 3 weeks ago, ?he noticed a sore on his right second toe. He saw his primary care provider on March 29. They prescribed Augmentin and Bactrim. He saw podiatry the same ?day. Podiatry trimmed his nails and postulated that the breakdown in the second digit was likely due to pressure from his dystrophic great toenail. There was ?also some crusting on the patient's left heel, but there was no breakdown of the skin appreciated. ?Today, I concur that the heel is not open. The right second toe, however  probes to bone and there is exposed fat and tendon. There is slough in the wound. The ?toe itself is not particularly red and there is no purulent drainage or odor. The patient is wearing Crocs. ?01/17/2022: Today, the bone is more obviously exposed, but the wound itself is cleaner. Minimal slough. Tendon is exposed and is nonviable. X-ray done last ?week was negative  for osteomyelitis and culture did not grow out any organisms. ?01/24/2022: The wound has continued to deteriorate over the past week. The joint between the distal and proximal phalanx is visible and frankly crumbles when ?probed. Despite the negative x-ray and culture done 2 weeks ago, I cannot help but think that osteomyelitis is present. ?Electronic Signature(s) ?Signed: 01/24/2022 3:58:41 PM By: Fredirick Maudlin MD FACS ?Entered By: Fredirick Maudlin on 01/24/2022 15:58:40 ?-------------------------------------------------------------------------------- ?Physical Exam Details ?Patient Name: Date of Service: ?[name], [place] B. 01/24/2022 2:45 PM ?Medical Record Number: [id number] ?Patient Account Number: [id number] ?Date of Birth/Sex: Treating RN: ?07/08/1946 (76 y.o. M) ?Primary Care Provider: Garret Reddish Other Clinician: ?Referring Provider: ?Treating Provider/Extender: Fredirick Maudlin ?Garret Reddish ?Weeks in Treatment: 2 ?Constitutional ?. . . . No acute distress. ?Respiratory ?Normal work of breathing on room air. ?Notes ?01/24/2022: On the right second toe, the ulcer is larger, albeit cleaner with less slough. The joint space is now exposed and the bone is crumbling. No foul odor or ?purulent drainage. ?Electronic Signature(s) ?Signed: 01/24/2022 4:01:02 PM By: Fredirick Maudlin MD FACS ?Entered By: Fredirick Maudlin on 01/24/2022 16:01:02 ?-------------------------------------------------------------------------------- ?Physician Orders Details ?Patient Name: Date of Service: ?[name], [place] B. 01/24/2022 2:45 PM ?Medical Record Number:  [id number] ?Patient Account Number: [id number] ?Date of Birth/Sex: Treating RN: ?13-Feb-1946 (76 y.o. 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 - Dr Celine Ahr Rm 3 ?Return A ?Bathing/ Shower/ Hygiene ?May shower and wash wound with soap and water. ?Off-Loading ?Other: - Right off loading shoe ?Wound Treatment ?Wound #1 - T Second ?oe Wound Laterality: Right, Medial ?Cleanser: Wound Cleanser (Generic) Every Other Day/15 Days ?Discharge Instructions: Cleanse the wound with wound cleanser prior to applying a clean dressing using gauze sponges, not tissue or cotton balls. ?Prim Dressing: KerraCel Ag Gelling Fiber Dressing, 2x2 in (silver alginate) Every Other Day/15 Days ?ary ?Discharge Instructions: Apply silver alginate to wound bed as instructed ?Secondary Dressing: Woven Gauze Sponges 2x2 in (Generic) Every Other Day/15 Days ?Discharge Instructions: Apply over primary dressing as directed. ?Secured With: Child psychotherapist, Sterile 2x75 (in/in) (Generic) Every Other Day/15 Days ?Discharge Instructions: Secure with stretch gauze as directed. ?Secured With: Paper Tape, 2x10 (in/yd) (Generic) Every Other Day/15 Days ?Discharge Instructions: Secure dressing  with tape as directed. ?Consults ?Orthopedic - Dr. 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

## 2022-01-25 ENCOUNTER — Encounter: Payer: Self-pay | Admitting: Internal Medicine

## 2022-01-25 ENCOUNTER — Encounter: Payer: Self-pay | Admitting: Cardiovascular Disease

## 2022-01-26 ENCOUNTER — Telehealth: Payer: Self-pay | Admitting: Cardiovascular Disease

## 2022-01-26 ENCOUNTER — Ambulatory Visit (INDEPENDENT_AMBULATORY_CARE_PROVIDER_SITE_OTHER): Payer: Medicare Other | Admitting: Physician Assistant

## 2022-01-26 ENCOUNTER — Encounter (HOSPITAL_COMMUNITY): Payer: Self-pay | Admitting: Orthopaedic Surgery

## 2022-01-26 ENCOUNTER — Other Ambulatory Visit: Payer: Self-pay

## 2022-01-26 ENCOUNTER — Other Ambulatory Visit: Payer: Self-pay | Admitting: Orthopaedic Surgery

## 2022-01-26 DIAGNOSIS — M79674 Pain in right toe(s): Secondary | ICD-10-CM | POA: Diagnosis not present

## 2022-01-26 DIAGNOSIS — Z01810 Encounter for preprocedural cardiovascular examination: Secondary | ICD-10-CM | POA: Diagnosis not present

## 2022-01-26 NOTE — Telephone Encounter (Signed)
? ?  Pre-operative Risk Assessment  ?  ?Patient Name: [name]  ?DOB: [birth year] ?MRN: [id number]  ? ?  ? ?Request for Surgical Clearance   ? ?Procedure:   Right Second Toe Joint Disarticulation as well as Irrigation and Drainage of Abscess ? ?Date of Surgery:  Clearance 01/27/22                              ?   ?Surgeon:  Dr. Lucia Gaskins ?Surgeon's Group or Practice Name:  [facility] ?Phone number:  [phone] ?Fax number:  [phone] ?  ?Type of Clearance Requested:   ?- Medical  ?- Pharmacy:  Hold Apixaban (Eliquis) TBD by Cardiology ?  ?Type of Anesthesia:   Choice ?  ?Additional requests/questions:   ? ?Signed, ?Trilby Drummer   ?01/26/2022, 2:39 PM   ?

## 2022-01-26 NOTE — Progress Notes (Signed)
The pt's granddaughter Sharyn Lull, called and  the pre-op medication list given to her, per the pt's request. ?

## 2022-01-26 NOTE — Progress Notes (Signed)
? ?Virtual Visit via Telephone Note  ? ?This visit type was conducted due to national recommendations for restrictions regarding the COVID-19 Pandemic (e.g. social distancing) in an effort to limit this patient's exposure and mitigate transmission in our community.  Due to his co-morbid illnesses, this patient is at least at moderate risk for complications without adequate follow up.  This format is felt to be most appropriate for this patient at this time.  The patient did not have access to video technology/had technical difficulties with video requiring transitioning to audio format only (telephone).  All issues noted in this document were discussed and addressed.  No physical exam could be performed with this format.  Please refer to the patient's chart for his  consent to telehealth for [facility]. ? ?Evaluation Performed:  Preoperative cardiovascular risk assessment ?_____________  ? ?Date:  01/26/2022  ? ?Patient ID:  [name], [name] 1946/07/05, MRN [id number] ?Patient Location:  ?Home ?Provider location:   ?Office ? ?Primary Care Provider:  Marin Olp, MD ?Primary Cardiologist:  Sherren Mocha, MD ? ?Chief Complaint  ?  ?76 y.o. y/o male with a h/o CAD with hx of late stent thrombosis, HFrEF, CKD III, OSA, HTN, HLD, morbid obesity, and former smoker, who is pending Right Second Toe Joint Disarticulation as well as Irrigation and Drainage of Abscess, and presents today for telephonic preoperative cardiovascular risk assessment. ? ?Past Medical History  ?  ?Past Medical History:  ?Diagnosis Date  ? B12 deficiency   ? per patient previously taking shots  ? C. difficile colitis 04/17/2019  ? C. difficile diarrhea   ? CAD (coronary artery disease), native coronary artery 04/12/2007  ? Chronic atrial fibrillation (HCC)   ? initial diagnoses 2012  ? Chronic combined systolic and diastolic CHF (congestive heart failure) ([place]) 04/15/2019  ? CKD (chronic kidney disease), stage III ([place]) 04/15/2019  ?  CORONARY ARTERY DISEASE 04/12/2007  ? 2 stents last in 2006.   ? Cystoid macular edema of right eye 01/16/2020  ? Cystoid macular edema of right eye 01/16/2020  ? DIABETES MELLITUS, TYPE II 04/16/2007  ? Diverticulosis of colon (without mention of hemorrhage)   ? GERD (gastroesophageal reflux disease) 08/09/2011  ? Hyperlipidemia 04/16/2007  ? 10/24 reveal study end. Atorvastatin 35m  ? HYPERTENSION 04/12/2007  ? HYPOTHYROIDISM 04/12/2007  ? Ischemic cardiomyopathy   ? [place]bid [place] 08/09/2011  ? MYOCARDIAL INFARCTION, HX OF 04/12/2007  ? Obstructive sleep apnea 04/12/2007  ? 02/2011 - AHI 96/h CPAP 13, Lg FF     ? Stroke ([facility]   ? Type II diabetes mellitus with peripheral circulatory disorder ([facility] [place] 04/16/2007  ? Dr. GCruzita Lederermanages. Uses fructosamine.    ? ULCERATIVE COLITIS, LEFT SIDED 11/23/2010  ? Ulcerative pancolitis without complication ([place] 140/98/1191 ? Ulcerative Colitis. Mesalamine.   ? VITAMIN B12 DEFICIENCY 11/29/2010  ? ?Past Surgical History:  ?Procedure Laterality Date  ? CARDIAC CATHETERIZATION  10/2002  ? STENT. 2 stents Dr. BMaurene Capes ? CARDIAC CATHETERIZATION N/A 10/28/2015  ? Procedure: Right/Left Heart Cath and Coronary Angiography;  Surgeon: MSherren Mocha MD;  [facility];  Service: Cardiovascular;  Laterality: N/A;  ? CATARACT EXTRACTION    ? 2021 bilateral eyes   ? COLONOSCOPY  2012  ? Dr. PSharlett Iles findings consistent with universal UC s/p random biopsies, no active bleeding, polyps, masses.  Pathology with mild chronic active colitis.  Repeat in 5 years.  ? CORONARY STENT PLACEMENT  2008  ? LAD   ?  RIGHT/LEFT HEART CATH AND CORONARY ANGIOGRAPHY N/A 06/01/2018  ? Procedure: RIGHT/LEFT HEART CATH AND CORONARY ANGIOGRAPHY;  Surgeon: Sherren Mocha, MD;  Location: [facility];  Service: Cardiovascular;  Laterality: N/A;  ? TONSILLECTOMY    ? ? ?Allergies ? ?Allergies  ?Allergen Reactions  ? Clindamycin/Lincomycin   ?  Developed C. difficile colitis 1 month after use  ? ? ?History of  Present Illness  ?  ?[name] is a 76 y.o. male who presents via audio/video conferencing for a telehealth visit today.  Pt was last seen in cardiology clinic on 12/28/21 by [name].  At that time [name] was doing well.  The patient is now pending Right Second Toe Joint Disarticulation as well as Irrigation and Drainage of Abscess.  Since his last visit, he has done well. He can complete more than 4.0 METS without angina (push mows, weed eats, can climb flight of stairs). He does not have any unstable cardiac conditions currently. ? ? ?Home Medications  ?  ?Prior to Admission medications   ?Medication Sig Start Date End Date Taking? Authorizing Provider  ?acetaminophen (TYLENOL) 500 MG tablet Take 1,000 mg by mouth every 6 (six) hours as needed for mild pain or moderate pain.    [provider]  ?apixaban (ELIQUIS) 5 MG TABS tablet Take 1 tablet (5 mg total) by mouth 2 (two) times daily. 12/28/21   Sherren Mocha, MD  ?APRISO 0.375 g 24 hr capsule Take 4 capsules (1.5 g total) by mouth daily. 09/22/21   Erenest Rasher, PA-C  ?atorvastatin (LIPITOR) 20 MG tablet TAKE ONE TABLET (20MG TOTAL) BY MOUTH DAILY 06/17/21   Marin Olp, MD  ?carvedilol (COREG) 25 MG tablet TAKE ONE TABLET BY MOUTH TWICE A DAY 03/10/21   Dunn, Lisbeth Renshaw N, PA-C  ?ciprofloxacin (CIPRO) 500 MG tablet Take 500 mg by mouth 2 (two) times daily. 01/24/22   [provider]  ?clopidogrel (PLAVIX) 75 MG tablet TAKE ONE TABLET (75MG TOTAL) BY MOUTH DAILY 04/07/21   Marin Olp, MD  ?Clotrimazole 1 % OINT Apply to penis twice daily after saline bath/rinse and pat dry ?Patient taking differently: Apply 1 application. topically daily as needed (Apply to penis  after saline bath/rinse and pat dry). 08/10/21   Marin Olp, MD  ?Coenzyme Q10 (COQ10 PO) Take 1 capsule by mouth daily with supper.    [provider]  ?cyanocobalamin 1000 MCG tablet Take 1,000 mcg by mouth daily.    [provider]   ?dicyclomine (BENTYL) 10 MG capsule TAKE ONE CAPSULE (10MG TOTAL) BY MOUTH TWO TIMES DAILY AS NEEDED FOR DIARRHEA OR ABDOMINAL CRAMPING 10/25/21   Annitta Needs, NP  ?doxycycline (VIBRAMYCIN) 100 MG capsule Take 100 mg by mouth 2 (two) times daily. 01/24/22   [provider]  ?fish oil-omega-3 fatty acids 1000 MG capsule Take 1 g by mouth daily with supper.    [provider]  ?furosemide (LASIX) 40 MG tablet Take 1 tablet (40 mg total) by mouth 2 (two) times daily. 08/09/21   Bensimhon, Shaune Pascal, MD  ?glucose blood test strip Use as instructed to test 4 time daily 01/14/21   Philemon Kingdom, MD  ?isosorbide mononitrate (IMDUR) 60 MG 24 hr tablet TAKE ONE (1) TABLET BY MOUTH EVERY DAY 06/22/21   Allred, Jeneen Rinks, MD  ?JARDIANCE 10 MG TABS tablet TAKE ONE (1) TABLET BY MOUTH EVERY DAY 01/11/22   Philemon Kingdom, MD  ?metFORMIN (GLUCOPHAGE) 500 MG tablet Take  1 tablet (500 mg total) by mouth 2 (two) times daily with a meal. 01/06/21   Philemon Kingdom, MD  ?mupirocin ointment (BACTROBAN) 2 % Apply 1 application. topically 2 (two) times daily. ?Patient not taking: Reported on 01/26/2022 01/05/22   Tawnya Crook, MD  ?saccharomyces boulardii (FLORASTOR) 250 MG capsule Take 1 capsule (250 mg total) by mouth 2 (two) times daily. 04/22/19   Ladene Artist, MD  ?sacubitril-valsartan (ENTRESTO) 97-103 MG Take 1 tablet by mouth 2 (two) times daily. 09/27/21   Sherren Mocha, MD  ?sitaGLIPtin (JANUVIA) 100 MG tablet TAKE ONE (1) TABLET BY MOUTH EVERY DAY 01/11/22   Philemon Kingdom, MD  ?spironolactone (ALDACTONE) 25 MG tablet TAKE ONE (1) TABLET BY MOUTH EVERY DAY 11/02/21   Sherren Mocha, MD  ?SYNTHROID 50 MCG tablet TAKE ONE TABLET BY MOUTH DAILY BEFORE BREAKFAST 12/14/20   Philemon Kingdom, MD  ?triamcinolone (KENALOG) 0.1 % Apply 1 application topically 2 (two) times daily. For 7-10 days maximum ?Patient taking differently: Apply 1 application. topically daily as needed (Skin). For 7-10 days maximum 01/13/21    Marin Olp, MD  ? ? ?Physical Exam  ?  ?Vital Signs:  [name] does not have vital signs available for review today. ? ?Given telephonic nature of communication, physical exam is limited. ?A

## 2022-01-26 NOTE — Progress Notes (Signed)
PCP - Dr. Yong Channel ? ?Cardiologist - [name] ? ?EP- Denies ? ?Endocrine- Denies ? ?Pulm- Denies ? ?Chest x-ray - Denies ? ?EKG - 08/17/21 (E) ? ?Stress Test - Denies ? ?ECHO - 12/28/20 (E) ? ?Cardiac Cath - 06/01/18 (E) ? ?AICD-na ?PM-na ?LOOP-na ? ?Nerve Stimulator- Denies ? ?Dialysis- Denies ? ?Sleep Study - Yes- Positive ?CPAP - Yes ? ?LABS- 01/27/22: CBC, BMP ? ?ASA- Denies ?ELIQUIS- LD- 4/18 ?PLAVIX- Continue ? ?ERAS- clears until 1000 ? ?HA1C- 11/18/21(E): 6.2 ?Fasting Blood Sugar - 113-128 ?Checks Blood Sugar ___1-2__ times a day ? ?Anesthesia- No ? ?Pt denies having chest pain, sob, or fever during the pre-op phone call. All instructions explained to the pt, with a verbal understanding of the material including: as of today, stop taking all Aspirin (unless instructed by your doctor) and Other Aspirin containing products, Vitamins, Fish oils, and Herbal medications. Also stop all NSAIDS i.e. Advil, Ibuprofen, Motrin, Aleve, Anaprox, Naproxen, BC, Goody Powders, and all Supplements.  ? ?WHAT DO I DO ABOUT MY DIABETES MEDICATION? ? ?Do not take Jardiance, Metformin, Januvia the morning of surgery. ? ?If your CBG is greater than 220 mg/dL, call the number below for further instructions. ? ?How do I manage my blood sugar before surgery? ?Check your blood sugar the morning of your surgery when you wake up and every 2 hours until you get to the Short Stay unit. ?If your blood sugar is less than 70 mg/dL, you will need to treat for low blood sugar: ?Do not take insulin. ?Treat a low blood sugar (less than 70 mg/dL) with ? cup of clear juice (cranberry or apple), 4 glucose tablets, OR glucose gel. ?Recheck blood sugar in 15 minutes after treatment (to make sure it is greater than 70 mg/dL). If your blood sugar is not greater than 70 mg/dL on recheck, call [phone] ? for further instructions. ?Report your blood sugar to the short stay nurse when you get to Short Stay. ? ?Reviewed and Endorsed by [facility] Patient  Education Committee, August 2015 ? ? Pt also instructed to wear a mask and social distance if he goes out. The opportunity to ask questions was provided.  ?

## 2022-01-26 NOTE — Telephone Encounter (Signed)
Patient placed on schedule for today. ?

## 2022-01-26 NOTE — Telephone Encounter (Signed)
? ? ?  Name: [name]  ?DOB: Oct 07, 1946  ?MRN: [id number] ? ?Primary Cardiologist: Sherren Mocha, MD ? ? ?Preoperative team, please contact this patient and set up a phone call appointment for further preoperative risk assessment. Please obtain consent and complete medication review. Thank you for your help. ? ?I confirm that guidance regarding antiplatelet and oral anticoagulation therapy has been completed and, if necessary, noted below. ? ? ? ?Ledora Bottcher, PA ?01/26/2022, 4:51 PM ?[facility] ?[address] ?[place], [place] [address] ? ? ?

## 2022-01-26 NOTE — Telephone Encounter (Signed)
Patient with diagnosis of afib on Eliquis for anticoagulation.   ? ?Procedure: Right Second Toe Joint Disarticulation as well as Irrigation and Drainage of Abscess ?Date of procedure: 01/27/22 ? ?CHA2DS2-VASc Score = 8  ?This indicates a 10.8% annual risk of stroke. ?The patient's score is based upon: ?CHF History: 1 ?HTN History: 1 ?Diabetes History: 1 ?Stroke History: 2 ?Vascular Disease History: 1 ?Age Score: 2 ?Gender Score: 0 ?  ?CrCl 80m/min using adjusted body weight due to obesity ?Platelet count 124K ? ?Procedure is scheduled for tomorrow. Pt can skip tonight's dose of Eliquis before procedure tomorrow, then should resume as soon as safely possible after given elevated CV risk. ?

## 2022-01-27 ENCOUNTER — Ambulatory Visit (HOSPITAL_BASED_OUTPATIENT_CLINIC_OR_DEPARTMENT_OTHER): Payer: Medicare Other | Admitting: Certified Registered Nurse Anesthetist

## 2022-01-27 ENCOUNTER — Encounter (HOSPITAL_COMMUNITY)
Admission: RE | Disposition: A | Discharge status: Home / Self Care | Payer: Self-pay | Source: Home / Self Care | Attending: Orthopaedic Surgery

## 2022-01-27 ENCOUNTER — Ambulatory Visit (HOSPITAL_COMMUNITY): Payer: Medicare Other | Admitting: Certified Registered Nurse Anesthetist

## 2022-01-27 ENCOUNTER — Encounter (HOSPITAL_COMMUNITY): Payer: Self-pay | Admitting: Orthopaedic Surgery

## 2022-01-27 ENCOUNTER — Ambulatory Visit (HOSPITAL_COMMUNITY)
Admission: RE | Admit: 2022-01-27 | Discharge: 2022-01-27 | Disposition: A | Discharge status: Home / Self Care | Payer: Medicare Other | Attending: Orthopaedic Surgery | Admitting: Orthopaedic Surgery

## 2022-01-27 DIAGNOSIS — Z7984 Long term (current) use of oral hypoglycemic drugs: Secondary | ICD-10-CM

## 2022-01-27 DIAGNOSIS — M869 Osteomyelitis, unspecified: Secondary | ICD-10-CM

## 2022-01-27 DIAGNOSIS — Z87891 Personal history of nicotine dependence: Secondary | ICD-10-CM | POA: Insufficient documentation

## 2022-01-27 DIAGNOSIS — N183 Chronic kidney disease, stage 3 unspecified: Secondary | ICD-10-CM | POA: Diagnosis not present

## 2022-01-27 DIAGNOSIS — Z955 Presence of coronary angioplasty implant and graft: Secondary | ICD-10-CM | POA: Diagnosis not present

## 2022-01-27 DIAGNOSIS — E1151 Type 2 diabetes mellitus with diabetic peripheral angiopathy without gangrene: Secondary | ICD-10-CM

## 2022-01-27 DIAGNOSIS — E039 Hypothyroidism, unspecified: Secondary | ICD-10-CM | POA: Diagnosis not present

## 2022-01-27 DIAGNOSIS — I252 Old myocardial infarction: Secondary | ICD-10-CM | POA: Insufficient documentation

## 2022-01-27 DIAGNOSIS — E11621 Type 2 diabetes mellitus with foot ulcer: Secondary | ICD-10-CM | POA: Diagnosis not present

## 2022-01-27 DIAGNOSIS — E1122 Type 2 diabetes mellitus with diabetic chronic kidney disease: Secondary | ICD-10-CM | POA: Insufficient documentation

## 2022-01-27 DIAGNOSIS — M86171 Other acute osteomyelitis, right ankle and foot: Secondary | ICD-10-CM | POA: Diagnosis not present

## 2022-01-27 DIAGNOSIS — I5042 Chronic combined systolic (congestive) and diastolic (congestive) heart failure: Secondary | ICD-10-CM | POA: Diagnosis not present

## 2022-01-27 DIAGNOSIS — M868X7 Other osteomyelitis, ankle and foot: Secondary | ICD-10-CM | POA: Diagnosis not present

## 2022-01-27 DIAGNOSIS — I2510 Atherosclerotic heart disease of native coronary artery without angina pectoris: Secondary | ICD-10-CM | POA: Diagnosis not present

## 2022-01-27 DIAGNOSIS — E1169 Type 2 diabetes mellitus with other specified complication: Secondary | ICD-10-CM | POA: Diagnosis not present

## 2022-01-27 DIAGNOSIS — I130 Hypertensive heart and chronic kidney disease with heart failure and stage 1 through stage 4 chronic kidney disease, or unspecified chronic kidney disease: Secondary | ICD-10-CM | POA: Diagnosis not present

## 2022-01-27 DIAGNOSIS — L02611 Cutaneous abscess of right foot: Secondary | ICD-10-CM | POA: Diagnosis not present

## 2022-01-27 DIAGNOSIS — G4730 Sleep apnea, unspecified: Secondary | ICD-10-CM | POA: Diagnosis not present

## 2022-01-27 DIAGNOSIS — L02511 Cutaneous abscess of right hand: Secondary | ICD-10-CM | POA: Diagnosis not present

## 2022-01-27 HISTORY — PX: I & D EXTREMITY: SHX5045

## 2022-01-27 LAB — CBC
HCT: 42.5 % (ref 39.0–52.0)
Hemoglobin: 14.3 g/dL (ref 13.0–17.0)
MCH: 30.7 pg (ref 26.0–34.0)
MCHC: 33.6 g/dL (ref 30.0–36.0)
MCV: 91.2 fL (ref 80.0–100.0)
Platelets: 129 10*3/uL — ABNORMAL LOW (ref 150–400)
RBC: 4.66 MIL/uL (ref 4.22–5.81)
RDW: 13.2 % (ref 11.5–15.5)
WBC: 8.5 10*3/uL (ref 4.0–10.5)
nRBC: 0 % (ref 0.0–0.2)

## 2022-01-27 LAB — BASIC METABOLIC PANEL
Anion gap: 8 (ref 5–15)
BUN: 22 mg/dL (ref 8–23)
CO2: 27 mmol/L (ref 22–32)
Calcium: 8.6 mg/dL — ABNORMAL LOW (ref 8.9–10.3)
Chloride: 104 mmol/L (ref 98–111)
Creatinine, Ser: 1.48 mg/dL — ABNORMAL HIGH (ref 0.61–1.24)
GFR, Estimated: 49 mL/min — ABNORMAL LOW (ref >60)
Glucose, Bld: 117 mg/dL — ABNORMAL HIGH (ref 70–99)
Potassium: 4.5 mmol/L (ref 3.5–5.1)
Sodium: 139 mmol/L (ref 135–145)

## 2022-01-27 LAB — GLUCOSE, CAPILLARY
Glucose-Capillary: 110 mg/dL — ABNORMAL HIGH (ref 70–99)
Glucose-Capillary: 138 mg/dL — ABNORMAL HIGH (ref 70–99)

## 2022-01-27 SURGERY — IRRIGATION AND DEBRIDEMENT EXTREMITY
Anesthesia: Monitor Anesthesia Care | Laterality: Right

## 2022-01-27 MED ORDER — SODIUM CHLORIDE 0.9 % IV SOLN: INTRAVENOUS | Status: DC | PRN

## 2022-01-27 MED ORDER — FENTANYL CITRATE (PF) 100 MCG/2ML IJ SOLN: 25.0000 ug | INTRAMUSCULAR | Status: DC | PRN

## 2022-01-27 MED ORDER — BUPIVACAINE HCL (PF) 0.25 % IJ SOLN
INTRAMUSCULAR | Status: AC
  Filled 2022-01-27: qty 30

## 2022-01-27 MED ORDER — ACETAMINOPHEN 10 MG/ML IV SOLN: 1000.0000 mg | Freq: Once | INTRAVENOUS | Status: DC | PRN

## 2022-01-27 MED ORDER — CEFAZOLIN SODIUM-DEXTROSE 2-4 GM/100ML-% IV SOLN
2.0000 g | INTRAVENOUS | Status: AC
  Administered 2022-01-27: 2 g via INTRAVENOUS
  Filled 2022-01-27: qty 100

## 2022-01-27 MED ORDER — AMISULPRIDE (ANTIEMETIC) 5 MG/2ML IV SOLN: 10.0000 mg | Freq: Once | INTRAVENOUS | Status: DC | PRN

## 2022-01-27 MED ORDER — PROPOFOL 10 MG/ML IV BOLUS
INTRAVENOUS | Status: DC | PRN
  Administered 2022-01-27: 50 mg via INTRAVENOUS

## 2022-01-27 MED ORDER — PROPOFOL 500 MG/50ML IV EMUL
INTRAVENOUS | Status: DC | PRN
Start: 2022-01-27 — End: 2022-01-27
  Administered 2022-01-27: 75 ug/kg/min via INTRAVENOUS

## 2022-01-27 MED ORDER — LACTATED RINGERS IV SOLN: INTRAVENOUS | Status: DC

## 2022-01-27 MED ORDER — CHLORHEXIDINE GLUCONATE 0.12 % MT SOLN
15.0000 mL | Freq: Once | OROMUCOSAL | Status: AC
  Administered 2022-01-27: 15 mL via OROMUCOSAL
  Filled 2022-01-27: qty 15

## 2022-01-27 MED ORDER — ONDANSETRON HCL 4 MG/2ML IJ SOLN
INTRAMUSCULAR | Status: DC | PRN
  Administered 2022-01-27: 4 mg via INTRAVENOUS

## 2022-01-27 MED ORDER — OXYCODONE HCL 5 MG PO TABS
5.0000 mg | ORAL_TABLET | ORAL | 0 refills | Status: AC | PRN
Start: 2022-01-27 — End: 2022-01-30

## 2022-01-27 MED ORDER — ORAL CARE MOUTH RINSE: 15.0000 mL | Freq: Once | OROMUCOSAL | Status: AC

## 2022-01-27 MED ORDER — EPHEDRINE SULFATE (PRESSORS) 50 MG/ML IJ SOLN
INTRAMUSCULAR | Status: DC | PRN
  Administered 2022-01-27: 10 mg via INTRAVENOUS

## 2022-01-27 MED ORDER — 0.9 % SODIUM CHLORIDE (POUR BTL) OPTIME
TOPICAL | Status: DC | PRN
  Administered 2022-01-27: 1000 mL

## 2022-01-27 MED ORDER — PHENYLEPHRINE 80 MCG/ML (10ML) SYRINGE FOR IV PUSH (FOR BLOOD PRESSURE SUPPORT)
PREFILLED_SYRINGE | INTRAVENOUS | Status: DC | PRN
  Administered 2022-01-27 (×2): 80 ug via INTRAVENOUS

## 2022-01-27 MED ORDER — ONDANSETRON HCL 4 MG/2ML IJ SOLN: 4.0000 mg | Freq: Once | INTRAMUSCULAR | Status: DC | PRN

## 2022-01-27 MED ORDER — BUPIVACAINE HCL (PF) 0.25 % IJ SOLN
INTRAMUSCULAR | Status: DC | PRN
  Administered 2022-01-27: 15 mL

## 2022-01-27 MED ORDER — DEXAMETHASONE SODIUM PHOSPHATE 10 MG/ML IJ SOLN
INTRAMUSCULAR | Status: DC | PRN
  Administered 2022-01-27: 8 mg via INTRAVENOUS

## 2022-01-27 MED ORDER — VANCOMYCIN HCL 1000 MG IV SOLR
INTRAVENOUS | Status: AC
  Filled 2022-01-27: qty 20

## 2022-01-27 MED ORDER — INSULIN ASPART 100 UNIT/ML IJ SOLN: 0.0000 [IU] | INTRAMUSCULAR | Status: DC | PRN

## 2022-01-27 SURGICAL SUPPLY — 32 items
BAG COUNTER SPONGE SURGICOUNT (BAG) ×1 IMPLANT
BNDG ELASTIC 4X5.8 VLCR STR LF (GAUZE/BANDAGES/DRESSINGS) ×1 IMPLANT
BNDG ESMARK 4X9 LF (GAUZE/BANDAGES/DRESSINGS) IMPLANT
COVER SURGICAL LIGHT HANDLE (MISCELLANEOUS) ×3 IMPLANT
DRAPE U-SHAPE 47X51 STRL (DRAPES) ×2 IMPLANT
ELECT REM PT RETURN 9FT ADLT (ELECTROSURGICAL) ×2
ELECTRODE REM PT RTRN 9FT ADLT (ELECTROSURGICAL) ×1 IMPLANT
GAUZE SPONGE 4X4 12PLY STRL (GAUZE/BANDAGES/DRESSINGS) ×1 IMPLANT
GAUZE XEROFORM 1X8 LF (GAUZE/BANDAGES/DRESSINGS) ×2 IMPLANT
GLOVE BIOGEL M STRL SZ7.5 (GLOVE) ×2 IMPLANT
GLOVE INDICATOR 8.0 STRL GRN (GLOVE) ×2 IMPLANT
GLOVE SRG 8 PF TXTR STRL LF DI (GLOVE) ×1 IMPLANT
GLOVE SURG UNDER POLY LF SZ8 (GLOVE) ×2
GOWN STRL REUS W/ TWL LRG LVL3 (GOWN DISPOSABLE) ×1 IMPLANT
GOWN STRL REUS W/ TWL XL LVL3 (GOWN DISPOSABLE) ×1 IMPLANT
GOWN STRL REUS W/TWL LRG LVL3 (GOWN DISPOSABLE) ×2
GOWN STRL REUS W/TWL XL LVL3 (GOWN DISPOSABLE) ×2
KIT BASIN OR (CUSTOM PROCEDURE TRAY) ×2 IMPLANT
KIT TURNOVER KIT B (KITS) ×2 IMPLANT
MANIFOLD NEPTUNE II (INSTRUMENTS) ×2 IMPLANT
NDL HYPO 25GX1X1/2 BEV (NEEDLE) IMPLANT
NEEDLE HYPO 25GX1X1/2 BEV (NEEDLE) ×2 IMPLANT
NS IRRIG 1000ML POUR BTL (IV SOLUTION) ×2 IMPLANT
PACK ORTHO EXTREMITY (CUSTOM PROCEDURE TRAY) ×2 IMPLANT
PAD ARMBOARD 7.5X6 YLW CONV (MISCELLANEOUS) ×4 IMPLANT
PAD CAST 4YDX4 CTTN HI CHSV (CAST SUPPLIES) ×1 IMPLANT
PADDING CAST COTTON 4X4 STRL (CAST SUPPLIES) ×2
SPONGE T-LAP 18X18 ~~LOC~~+RFID (SPONGE) ×1 IMPLANT
SUT ETHILON 2 0 FS 18 (SUTURE) ×1 IMPLANT
TUBE CONNECTING 12X1/4 (SUCTIONS) ×2 IMPLANT
UNDERPAD 30X36 HEAVY ABSORB (UNDERPADS AND DIAPERS) ×3 IMPLANT
YANKAUER SUCT BULB TIP NO VENT (SUCTIONS) ×2 IMPLANT

## 2022-01-27 NOTE — Transfer of Care (Signed)
Immediate Anesthesia Transfer of Care Note ? ?Patient: [name] ? ?Procedure(s) Performed: RIGHT SECOND METARTSOPHALANGEAL JOINT DISARTICULATION, IRRIGATION AND DEBRIDEMENT OF ABSCESS RIGHT FOOT (Right) ? ?Patient Location: PACU ? ?Anesthesia Type:MAC ? ?Level of Consciousness: awake, alert  and oriented ? ?Airway & Oxygen Therapy: Patient Spontanous Breathing ? ?Post-op Assessment: Report given to RN and Post -op Vital signs reviewed and stable ? ?Post vital signs: Reviewed and stable ? ?Last Vitals:  ?Vitals Value Taken Time  ?BP 79/50 01/27/22 1233  ?Temp    ?Pulse 47 01/27/22 1235  ?Resp 21 01/27/22 1235  ?SpO2 94 % 01/27/22 1235  ?Vitals shown include unvalidated device data. ? ?Last Pain:  ?Vitals:  ? 01/27/22 1046  ?TempSrc:   ?PainSc: 0-No pain  ?   ? ?Patients Stated Pain Goal: 0 (01/26/22 1804) ? ?Complications: No notable events documented. ?

## 2022-01-27 NOTE — Anesthesia Preprocedure Evaluation (Addendum)
Anesthesia Evaluation  ?Patient identified by MRN, date of birth, ID band ? ?Reviewed: ?Allergy & Precautions, NPO status , Patient's Chart, lab work & pertinent test results ? ?Airway ?Mallampati: II ? ?TM Distance: >3 FB ?Neck ROM: Full ? ? ? Dental ? ?(+) Edentulous Upper ?  ?Pulmonary ?sleep apnea and Continuous Positive Airway Pressure Ventilation , former smoker,  ?  ?Pulmonary exam normal ? ? ? ? ? ? ? Cardiovascular ?hypertension, Pt. on home beta blockers ?+ CAD, + Past MI, + Cardiac Stents, + Peripheral Vascular Disease and +CHF  ?Normal cardiovascular exam ? ?ECHO: ?Poor acoustic windows limit study, even with the use of Definity.  ?Overall LVEF is probably low normal with severe hypokinesis of the anteroseptal wall. Compared to echo from 04/29/18, LVEF is improved. . Left  ?ventricular ejection fraction, by estimation, is 50 to 55%. The left ventricle has low normal function.  ?There is mild left ventricular hypertrophy. Left ventricular diastolic  ?parameters are indeterminate.  ?Right ventricular systolic function is normal. The right ventricular size is normal.  ?Left atrial size was mildly dilated.  ?The mitral valve is normal in structure. No evidence of mitral valve regurgitation.  ?The aortic valve is abnormal. Aortic valve regurgitation is not visualized. Mild to moderate aortic valve sclerosis/calcification is  ?present, without any evidence of aortic stenosis.  ?The inferior vena cava is normal in size with <50% respiratory variability, suggesting right atrial pressure of 8 mmHg. ?  ?Neuro/Psych ?CVA, No Residual Symptoms negative psych ROS  ? GI/Hepatic ?Neg liver ROS, PUD,   ?Endo/Other  ?diabetes, Oral Hypoglycemic AgentsHypothyroidism  ? Renal/GU ?Renal disease  ? ?  ?Musculoskeletal ? ?(+) Arthritis ,  ? Abdominal ?(+) + obese,   ?Peds ? Hematology ? ?(+) Blood dyscrasia, ,   ?Anesthesia Other Findings ?right second toe osteomyelitis ?  Reproductive/Obstetrics ? ?  ? ? ? ? ? ? ? ? ? ? ? ? ? ?  ?  ? ? ? ? ? ? ? ?Anesthesia Physical ?Anesthesia Plan ? ?ASA: 3 ? ?Anesthesia Plan: MAC  ? ?Post-op Pain Management:   ? ?Induction: Intravenous ? ?PONV Risk Score and Plan: 1 and Ondansetron, Dexamethasone, Propofol infusion and Treatment may vary due to age or medical condition ? ?Airway Management Planned: Simple Face Mask ? ?Additional Equipment:  ? ?Intra-op Plan:  ? ?Post-operative Plan:  ? ?Informed Consent: I have reviewed the patients History and Physical, chart, labs and discussed the procedure including the risks, benefits and alternatives for the proposed anesthesia with the patient or authorized representative who has indicated his/her understanding and acceptance.  ? ? ? ?Dental advisory given ? ?Plan Discussed with: CRNA ? ?Anesthesia Plan Comments:   ? ? ? ? ? ?Anesthesia Quick Evaluation ? ?

## 2022-01-27 NOTE — Progress Notes (Signed)
Orthopedic Tech Progress Note ?Patient Details:  ?[name] ?1946/06/10 ?[id number] ? ?Ortho Devices ?Type of Ortho Device: Postop shoe/boot ?Ortho Device/Splint Location: rle ?Ortho Device/Splint Interventions: Ordered, Application, Adjustment ? Applied at request of OR RN ?Post Interventions ?Patient Tolerated: Well ?Instructions Provided: Care of device, Adjustment of device ? ?Karolee Stamps ?01/27/2022, 12:48 PM ? ?

## 2022-01-27 NOTE — Anesthesia Postprocedure Evaluation (Signed)
Anesthesia Post Note ? ?Patient: [name] ? ?Procedure(s) Performed: RIGHT SECOND METARTSOPHALANGEAL JOINT DISARTICULATION, IRRIGATION AND DEBRIDEMENT OF ABSCESS RIGHT FOOT (Right) ? ?  ? ?Patient location during evaluation: PACU ?Anesthesia Type: MAC ?Level of consciousness: awake ?Pain management: pain level controlled ?Vital Signs Assessment: post-procedure vital signs reviewed and stable ?Respiratory status: spontaneous breathing, nonlabored ventilation, respiratory function stable and patient connected to nasal cannula oxygen ?Cardiovascular status: stable and blood pressure returned to baseline ?Postop Assessment: no apparent nausea or vomiting ?Anesthetic complications: no ? ? ?No notable events documented. ? ?Last Vitals:  ?Vitals:  ? 01/27/22 1230 01/27/22 1245  ?BP: (!) 79/54 112/62  ?Pulse: 65 86  ?Resp: 17 16  ?Temp: 36.7 ?C 36.6 ?C  ?SpO2: 93% 98%  ?  ?Last Pain:  ?Vitals:  ? 01/27/22 1230  ?TempSrc:   ?PainSc: 0-No pain  ? ? ?  ?  ?  ?  ?  ?  ? ?Azile Minardi P Doshia Dalia ? ? ? ? ?

## 2022-01-27 NOTE — H&P (Signed)
PREOPERATIVE H&P ? ?Chief Complaint: Right second toe osteomyelitis with forefoot abscess ? ?HPI: ?[name] is a 76 y.o. 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  He presented in the office yesterday with worsening symptoms.  He had been followed by wound care and had been on multiple rounds of antibiotics without relief of his symptoms.  The symptoms were worsening and ulceration was worsening as well.  He is here today for second toe amputation and I&D of his abscess. Symptoms are rated as moderate to severe, and have been worsening.  This is significantly impairing activities of daily living.  He has elected for surgical management.  ? ?Past Medical History:  ?Diagnosis Date  ? B12 deficiency   ? per patient previously taking shots  ? C. difficile colitis 04/17/2019  ? C. difficile diarrhea   ? CAD (coronary artery disease), native coronary artery 04/12/2007  ? Chronic atrial fibrillation (HCC)   ? initial diagnoses 2012  ? Chronic combined systolic and diastolic CHF (congestive heart failure) ([place]) 04/15/2019  ? CKD (chronic kidney disease), stage III ([place]) 04/15/2019  ? CORONARY ARTERY DISEASE 04/12/2007  ? 2 stents last in 2006.   ? Cystoid macular edema of right eye 01/16/2020  ? Cystoid macular edema of right eye 01/16/2020  ? DIABETES MELLITUS, TYPE II 04/16/2007  ? Diverticulosis of colon (without mention of hemorrhage)   ? GERD (gastroesophageal reflux disease) 08/09/2011  ? Hyperlipidemia 04/16/2007  ? 10/24 reveal study end. Atorvastatin 42m  ? HYPERTENSION 04/12/2007  ? HYPOTHYROIDISM 04/12/2007  ? Ischemic cardiomyopathy   ? Morbid obesity ([facility] 08/09/2011  ? MYOCARDIAL INFARCTION, HX OF 04/12/2007  ? Obstructive sleep apnea 04/12/2007  ? 02/2011 - AHI 96/h CPAP 13, Lg FF     ? Stroke ([facility]   ? Type II diabetes mellitus with peripheral circulatory disorder ([facility] 04/16/2007  ? Dr. GCruzita Lederermanages. Uses  fructosamine.    ? ULCERATIVE COLITIS, LEFT SIDED 11/23/2010  ? Ulcerative pancolitis without complication ([facility] [id number] ? Ulcerative Colitis. Mesalamine.   ? VITAMIN B12 DEFICIENCY 11/29/2010  ? ?Past Surgical History:  ?Procedure Laterality Date  ? CARDIAC CATHETERIZATION  10/2002  ? STENT. 2 stents Dr. BMaurene Capes ? CARDIAC CATHETERIZATION N/A 10/28/2015  ? Procedure: Right/Left Heart Cath and Coronary Angiography;  Surgeon: MSherren Mocha MD;  [facility];  Service: Cardiovascular;  Laterality: N/A;  ? CATARACT EXTRACTION    ? 2021 bilateral eyes   ? COLONOSCOPY  2012  ? Dr. PSharlett Iles findings consistent with universal UC s/p random biopsies, no active bleeding, polyps, masses.  Pathology with mild chronic active colitis.  Repeat in 5 years.  ? CORONARY STENT PLACEMENT  2008  ? LAD   ? RIGHT/LEFT HEART CATH AND CORONARY ANGIOGRAPHY N/A 06/01/2018  ? Procedure: RIGHT/LEFT HEART CATH AND CORONARY ANGIOGRAPHY;  Surgeon: CSherren Mocha MD;  [facility];  Service: Cardiovascular;  Laterality: N/A;  ? TONSILLECTOMY    ? ?Social History  ? ?Socioeconomic History  ? Marital status: Married  ?  Spouse name: Not on file  ? Number of children: 1  ? Years of education: Not on file  ? Highest education level: Not on file  ?Occupational History  ? Occupation: RETIRED  ?  Employer: RETIRED  ?Tobacco Use  ? Smoking status: Former  ?  Packs/day: 1.00  ?  Years: 5.00  ?  Pack years:  5.00  ?  Types: Cigarettes  ?  Quit date: 04/13/1966  ?  Years since quitting: 55.8  ? Smokeless tobacco: Never  ?Vaping Use  ? Vaping Use: Never used  ?Substance and Sexual Activity  ? Alcohol use: No  ?  Alcohol/week: 0.0 standard drinks  ?  Comment: no more beer  ? Drug use: No  ? Sexual activity: Not on file  ?Other Topics Concern  ? Not on file  ?Social History Narrative  ? Cardiorehab 3 days a week. 45 minutes to an hour-stationary bike.   ? GRANDDAUGHTER (MS. PETTIGREW) IS AN RN ON 2000 @ [employer]  ?   ? Retired from  Occupational hygienist  ? 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   ?   ? Married for 37 years in 2015, married previously for 14 years. Daughter with first wife and 3 grandkids.   ? Lives alone with wife. Get to see grandkids a lot. New grandchild in middle of 2015  ?   ? Hobbies-yardwork, previously liked to hunt and fish, does some target shooting  ?   ? Tourist information centre manager for 6 yrs  ? ?Social Determinants of Health  ? ?Financial Resource Strain: Low Risk   ? Difficulty of Paying Living Expenses: Not hard at all  ?Food Insecurity: No Food Insecurity  ? Worried About Charity fundraiser in the Last Year: Never true  ? Ran Out of Food in the Last Year: Never true  ?Transportation Needs: No Transportation Needs  ? Lack of Transportation (Medical): No  ? Lack of Transportation (Non-Medical): No  ?Physical Activity: Inactive  ? Days of Exercise per Week: 0 days  ? Minutes of Exercise per Session: 0 min  ?Stress: No Stress Concern Present  ? Feeling of Stress : Not at all  ?Social Connections: Socially Integrated  ? Frequency of Communication with Friends and Family: More than three times a week  ? Frequency of Social Gatherings with Friends and Family: More than three times a week  ? Attends Religious Services: More than 4 times per year  ? Active Member of Clubs or Organizations: Yes  ? Attends Archivist Meetings: 1 to 4 times per year  ? Marital Status: Married  ? ?Family History  ?Problem Relation Age of Onset  ? Heart attack Mother   ?     mid 41s  ? Heart disease Father   ?     H/O CAD, CABG, VALVE SURGERY  ? Hypertension Brother   ? Obesity Brother   ? Stomach cancer Maternal Aunt   ? Stomach cancer Paternal Grandmother   ?     ? colon   ? Colon cancer Neg Hx   ? ?Allergies  ?Allergen Reactions  ? Clindamycin/Lincomycin   ?  Developed C. difficile colitis 1 month after use  ? ?Prior to Admission medications   ?Medication Sig Start Date End Date Taking? Authorizing Provider   ?acetaminophen (TYLENOL) 500 MG tablet Take 1,000 mg by mouth every 6 (six) hours as needed for mild pain or moderate pain.   Yes [provider]  ?apixaban (ELIQUIS) 5 MG TABS tablet Take 1 tablet (5 mg total) by mouth 2 (two) times daily. 12/28/21  Yes Sherren Mocha, MD  ?APRISO 0.375 g 24 hr capsule Take 4 capsules (1.5 g total) by mouth daily. 09/22/21  Yes Aliene Altes S, PA-C  ?atorvastatin (LIPITOR) 20 MG tablet TAKE ONE TABLET (20MG TOTAL) BY MOUTH DAILY  06/17/21  Yes Marin Olp, MD  ?carvedilol (COREG) 25 MG tablet TAKE ONE TABLET BY MOUTH TWICE A DAY 03/10/21  Yes Dunn, Dayna N, PA-C  ?ciprofloxacin (CIPRO) 500 MG tablet Take 500 mg by mouth 2 (two) times daily. 01/24/22  Yes [provider]  ?clopidogrel (PLAVIX) 75 MG tablet TAKE ONE TABLET (75MG TOTAL) BY MOUTH DAILY 04/07/21  Yes Marin Olp, MD  ?Coenzyme Q10 (COQ10 PO) Take 1 capsule by mouth daily with supper.   Yes [provider]  ?cyanocobalamin 1000 MCG tablet Take 1,000 mcg by mouth daily.   Yes [provider]  ?dicyclomine (BENTYL) 10 MG capsule TAKE ONE CAPSULE (10MG TOTAL) BY MOUTH TWO TIMES DAILY AS NEEDED FOR DIARRHEA OR ABDOMINAL CRAMPING 10/25/21  Yes Annitta Needs, NP  ?doxycycline (VIBRAMYCIN) 100 MG capsule Take 100 mg by mouth 2 (two) times daily. 01/24/22  Yes [provider]  ?fish oil-omega-3 fatty acids 1000 MG capsule Take 1 g by mouth daily with supper.   Yes [provider]  ?furosemide (LASIX) 40 MG tablet Take 1 tablet (40 mg total) by mouth 2 (two) times daily. 08/09/21  Yes Bensimhon, Shaune Pascal, MD  ?isosorbide mononitrate (IMDUR) 60 MG 24 hr tablet TAKE ONE (1) TABLET BY MOUTH EVERY DAY 06/22/21  Yes Allred, Jeneen Rinks, MD  ?JARDIANCE 10 MG TABS tablet TAKE ONE (1) TABLET BY MOUTH EVERY DAY 01/11/22  Yes Philemon Kingdom, MD  ?metFORMIN (GLUCOPHAGE) 500 MG tablet Take 1 tablet (500 mg total) by mouth 2 (two) times daily with a meal. 01/06/21  Yes Philemon Kingdom,  MD  ?saccharomyces boulardii (FLORASTOR) 250 MG capsule Take 1 capsule (250 mg total) by mouth 2 (two) times daily. 04/22/19  Yes Ladene Artist, MD  ?sacubitril-valsartan (ENTRESTO) 97-103 MG Take 1 tablet by

## 2022-01-28 ENCOUNTER — Encounter (HOSPITAL_COMMUNITY): Payer: Self-pay | Admitting: Orthopaedic Surgery

## 2022-01-28 ENCOUNTER — Telehealth: Payer: Self-pay | Admitting: Family Medicine

## 2022-01-28 NOTE — Telephone Encounter (Signed)
FYI:  ?-pt had surgery to remove rt second metacarpophalangeal joint. 04/20 ?-Bone Biopsy taken. ?-Caller states it is unclear if the surgeon was going to follow up with the wound care, post op antibiotic needs, or bone biopsy results ? ?Request: ?-Granddaughter is asking if Dr. Yong Channel is able to complete FMLA paperwork for her, Ms. Moore, to care for pt intermittently when pt's souse is unable to do so. ?-Would be handled through the [place]. ? ?Approve? ? ?If approved, is appointment needed? ? ? ?

## 2022-01-28 NOTE — Telephone Encounter (Signed)
If she can go over with HR her FMLA paperwork and fill out as she would anticipate needs off and give me a portion where MD portion is then not filled out- I would be willing to review her requests/anticipations and likely submit this. Her filling out will give me more info with dates since I have not seen him since this started -otherwise we could do on 25th of may when we see each other  ?

## 2022-01-28 NOTE — Telephone Encounter (Signed)
See below, ok for approval for FMLA for granddaughter? ?

## 2022-01-29 LAB — AEROBIC/ANAEROBIC CULTURE W GRAM STAIN (SURGICAL/DEEP WOUND): Gram Stain: NONE SEEN

## 2022-01-31 ENCOUNTER — Encounter (HOSPITAL_BASED_OUTPATIENT_CLINIC_OR_DEPARTMENT_OTHER): Payer: Medicare Other | Admitting: General Surgery

## 2022-02-01 LAB — AEROBIC/ANAEROBIC CULTURE W GRAM STAIN (SURGICAL/DEEP WOUND): Gram Stain: NONE SEEN

## 2022-02-02 NOTE — Telephone Encounter (Signed)
Called and spoke with Sharyn Lull and below message given, she states she will fill in what she can and fax it over to [place].  ?

## 2022-02-05 ENCOUNTER — Other Ambulatory Visit: Payer: Self-pay | Admitting: Internal Medicine

## 2022-02-07 ENCOUNTER — Ambulatory Visit: Payer: Medicare Other | Admitting: Orthopedic Surgery

## 2022-02-08 NOTE — Op Note (Signed)
?NEWELL WAFER ?male ?76 y.o. ?01/27/2022 ? ? ?PreOperative Diagnosis: ?Right second toe distal osteomyelitis ?Right forefoot abscess with multiple bursal spaces ? ?PostOperative Diagnosis: ?Same ? ?PROCEDURE: ?Right second toe MTP disarticulation ?I&D of right forefoot abscess, multiple bursal spaces and tendon sheaths ? ?SURGEON: Melony Overly, MD ? ?ASSISTANT: Jesse Martinique, PA-C.  His assistance was necessary for patient positioning, prep, drape, retraction and closure of the wound. ? ?ANESTHESIA: General ? ?FINDINGS: ?See below ? ?IMPLANTS: ?None ? ?INDICATIONS:76 y.o. male presented to the office with large ulceration on the medial aspect of his second toe.  He had dislocation of his DIP joint evidence of bony destruction on x-ray.  He also had significant soft tissue swelling and erythema about the forefoot consistent with abscess.  Patient was a known diabetic.  Given his infection she was indicated for surgery.  He had been treated with wound care and oral antibiotics without improvement in his symptoms.  Patient understood the risks, benefits and alternatives to surgery which include but are not limited to wound healing complications, infection, nonunion, malunion, need for further surgery as well as damage to surrounding structures. They also understood the potential for continued pain in that there were no guarantees of acceptable outcome After weighing these risks the patient opted to proceed with surgery. ? ?PROCEDURE: ?Patient was identified in the preoperative holding area.  The right foot was marked by myself.  Consent was signed by myself and the patient.  Block was performed by anesthesia in the preoperative holding area.  Patient was taken to the operative suite and placed supine on the operative table.  General anesthesia was induced without difficulty. Bump was placed under the operative hip and bone foam was used.  All bony prominences were well padded.  Preoperative antibiotics were  given.  The operative extremity was prepped and draped in the usual sterile fashion. ? ?We began by marking out the appropriate incision about the second toe near the base.  Then 10 blade was used to sharply cut through skin, subcutaneous tissue and down to bone.  We are able to then sharply dissected out the remaining portion of the proximal phalanx at the level of the MTP joint.  The toe was then completely disarticulated from this area.  We then took sample of the distal aspect of the exposed bone and sent that off to the lab for microbiology.  Then the forefoot was further inspected and there is found to be purulent material within the exposed flexor tendon sheaths and dorsally within the bursal cavities. ? ?We then proceeded with incision and drainage of the bursal cavities.  Using scissor the abscess was deloculated on the dorsal aspect of the forefoot.  Then the tendon sheaths were also deloculated and the purulent material was expressed and removed.  Then some of this was also sent for culture.  Then 3000 cc of sterile saline was run through the forefoot.  The area of abscess was further decompressed.  After adequate debridement the area was inspected and found to be without further evidence of necrotic tissue or abscess cavity.  Multiple bursal cavities were encountered as well as tendon sheaths.  Traction tenotomy's were performed of the flexor tendons and extensor tendons. ? ?Then the wound was closed using 2-0 nylon suture in a tension-free manner. ? ?Soft dressing was placed. ? ?Patient was awakened from anesthesia and taken recovery in stable condition.  There were no complications. ? ? ?POST OPERATIVE INSTRUCTIONS: ?Heel weightbearing in a postoperative shoe ?Keep  dressing in place ?We will follow-up on cultures ?Patient will be discharged from the PACU and follow-up in 2 weeks time for wound check. ?Continue current antibiotic regimen until cultures dictate change needed. ? ?TOURNIQUET TIME: No  tourniquet was used ? ?BLOOD LOSS:  less than 50 mL ?        ?DRAINS: none ?        ?SPECIMEN: none ?      ?COMPLICATIONS:  * No complications entered in OR log * ?        ?Disposition: PACU - hemodynamically stable. ?        ?Condition: stable  ?

## 2022-02-21 NOTE — Telephone Encounter (Signed)
Pharmacy calling stating that pt's medication Eliquis is requiring a prior auth now and that the preferred drug is Xarelto and that pt is unable to get his medication. Please advise on this matter. Thanks  ?

## 2022-02-21 NOTE — Telephone Encounter (Signed)
I would file an appeal to keep him on apixaban.  His GFR fluctuates above and below 50 which places him in between the 15 mg and 20 mg dose of rivaroxaban.  I think his stroke risk could be excessive on the lower dose and his bleeding risk could be excessive on the higher dose.  He has clinically done well on apixaban now for many years and I think it is best/safest for him to stay on this medication. ?

## 2022-02-21 NOTE — Telephone Encounter (Signed)
**Note De-Identified  Obfuscation** Forwarding message to Dr Burt Knack and PharmD for advisement as the pts insureance prefers Xarelto over Eliquis. ?

## 2022-02-21 NOTE — Telephone Encounter (Signed)
**Note De-Identified  Obfuscation** Forwarding message to Dr Burt Knack and PharmD for advisement as the pts insurance prefers Xarelto over Eliquis. ?

## 2022-02-21 NOTE — Telephone Encounter (Signed)
**Note De-Identified  Obfuscation** The pt is aware that I am starting a non-formulary Eliquis PA and that I will let him know the outcome once available. ? ?He is also aware that we are leaving him 2 weeks of Eliquis samples in the front office for him to pick up as he will be taking the last tablet he has on hand tonight. ? ?He thanked me for our assistance. ?

## 2022-02-22 NOTE — Telephone Encounter (Signed)
**Note De-Identified  Obfuscation** Eliquis PA started through covermymeds. ?Key: [id number] ?

## 2022-02-23 NOTE — Telephone Encounter (Signed)
**Note De-Identified  Obfuscation** Martice Doty (Key: [id number]) ?Eliquis 5MG tablets ?  ?Form ?[employer] Medicare Part D Electronic Prior Authorization Form (2017 NCPDP) ?Determination: Favorable ?Message from Plan ?Request Reference Number: [id number].  ?ELIQUIS TAB 5MG is approved through 10/09/2022.  ? ?[facility] is aware of this approval and per the pharmacist they will notify the pt of this approval and pick up time for his Eliquis RX.  ?

## 2022-03-03 ENCOUNTER — Encounter: Payer: Self-pay | Admitting: Family Medicine

## 2022-03-03 ENCOUNTER — Ambulatory Visit (INDEPENDENT_AMBULATORY_CARE_PROVIDER_SITE_OTHER): Payer: Medicare Other | Admitting: Family Medicine

## 2022-03-03 VITALS — BP 104/64 | HR 72 | Temp 98.1°F | Ht 68.0 in | Wt 222.4 lb

## 2022-03-03 DIAGNOSIS — E538 Deficiency of other specified B group vitamins: Secondary | ICD-10-CM

## 2022-03-03 DIAGNOSIS — E785 Hyperlipidemia, unspecified: Secondary | ICD-10-CM

## 2022-03-03 DIAGNOSIS — Z79899 Other long term (current) drug therapy: Secondary | ICD-10-CM | POA: Diagnosis not present

## 2022-03-03 DIAGNOSIS — I5022 Chronic systolic (congestive) heart failure: Secondary | ICD-10-CM | POA: Diagnosis not present

## 2022-03-03 DIAGNOSIS — I25118 Atherosclerotic heart disease of native coronary artery with other forms of angina pectoris: Secondary | ICD-10-CM

## 2022-03-03 DIAGNOSIS — I10 Essential (primary) hypertension: Secondary | ICD-10-CM

## 2022-03-03 DIAGNOSIS — I4821 Permanent atrial fibrillation: Secondary | ICD-10-CM

## 2022-03-03 DIAGNOSIS — E039 Hypothyroidism, unspecified: Secondary | ICD-10-CM

## 2022-03-03 LAB — COMPREHENSIVE METABOLIC PANEL
ALT: 9 U/L (ref 0–53)
AST: 13 U/L (ref 0–37)
Albumin: 4.1 g/dL (ref 3.5–5.2)
Alkaline Phosphatase: 80 U/L (ref 39–117)
BUN: 16 mg/dL (ref 6–23)
CO2: 34 mEq/L — ABNORMAL HIGH (ref 19–32)
Calcium: 9.2 mg/dL (ref 8.4–10.5)
Chloride: 99 mEq/L (ref 96–112)
Creatinine, Ser: 1.3 mg/dL (ref 0.40–1.50)
GFR: 53.51 mL/min — ABNORMAL LOW (ref >60.00)
Glucose, Bld: 88 mg/dL (ref 70–99)
Potassium: 4.9 mEq/L (ref 3.5–5.1)
Sodium: 140 mEq/L (ref 135–145)
Total Bilirubin: 1 mg/dL (ref 0.2–1.2)
Total Protein: 6.5 g/dL (ref 6.0–8.3)

## 2022-03-03 LAB — CBC WITH DIFFERENTIAL/PLATELET
Basophils Absolute: 0 10*3/uL (ref 0.0–0.1)
Basophils Relative: 0.2 % (ref 0.0–3.0)
Eosinophils Absolute: 0.1 10*3/uL (ref 0.0–0.7)
Eosinophils Relative: 1.1 % (ref 0.0–5.0)
HCT: 44.4 % (ref 39.0–52.0)
Hemoglobin: 14.9 g/dL (ref 13.0–17.0)
Lymphocytes Relative: 13.4 % (ref 12.0–46.0)
Lymphs Abs: 1.3 10*3/uL (ref 0.7–4.0)
MCHC: 33.5 g/dL (ref 30.0–36.0)
MCV: 88.6 fl (ref 78.0–100.0)
Monocytes Absolute: 0.7 10*3/uL (ref 0.1–1.0)
Monocytes Relative: 7 % (ref 3.0–12.0)
Neutro Abs: 7.6 10*3/uL (ref 1.4–7.7)
Neutrophils Relative %: 78.3 % — ABNORMAL HIGH (ref 43.0–77.0)
Platelets: 156 10*3/uL (ref 150.0–400.0)
RBC: 5.01 Mil/uL (ref 4.22–5.81)
RDW: 14.8 % (ref 11.5–15.5)
WBC: 9.7 10*3/uL (ref 4.0–10.5)

## 2022-03-03 LAB — LIPID PANEL
Cholesterol: 98 mg/dL (ref 0–200)
HDL: 67.2 mg/dL (ref >39.00)
LDL Cholesterol: 6 mg/dL (ref 0–99)
NonHDL: 30.38
Total CHOL/HDL Ratio: 1
Triglycerides: 121 mg/dL (ref 0.0–149.0)
VLDL: 24.2 mg/dL (ref 0.0–40.0)

## 2022-03-03 LAB — TSH: TSH: 1.52 u[IU]/mL (ref 0.35–5.50)

## 2022-03-03 LAB — VITAMIN B12: Vitamin B-12: 734 pg/mL (ref 211–911)

## 2022-03-03 MED ORDER — FREESTYLE LIBRE 3 SENSOR MISC: 3 refills | Status: DC

## 2022-03-03 NOTE — Patient Instructions (Addendum)
Have copy of diabetic eye exam sent to [place] next month.  Let us know when you get your [facility] vaccine at the pharmacy.  Please stop by lab before you go If you have mychart- we will send your results within 3 business days of [place] receiving them.  If you do not have mychart- we will call you about results within 5 business days of [place] receiving them.  *please also note that you will see labs on mychart as soon as they post. I will later go in and write notes on them- will say "notes from Dr. Yong Channel"   Recommended follow up: Return in about 6 months (around 09/03/2022) for followup or sooner if needed.Schedule b4 you leave.

## 2022-03-03 NOTE — Progress Notes (Signed)
Phone [phone] In person visit   Subjective:   [name] is a 76 y.o. year old very pleasant male patient who presents for/with See problem oriented charting Chief Complaint  Patient presents with   Follow-up   Hypertension   diabetic abcess    Pt states had toe removed due to diabetic abcess.    Past Medical History-  Patient Active Problem List   Diagnosis Date Noted   History of transient ischemic attack (TIA) 10/24/2015    Priority: High   CHF (congestive heart failure), NYHA class II ([place]) 05/09/2014    Priority: High   Ulcerative pancolitis without complication ([place]) 37/07/6268    Priority: High   Atrial fibrillation ([place]) 12/08/2010    Priority: High   Type II diabetes mellitus with peripheral circulatory disorder ([place]) 04/16/2007    Priority: High   CAD (coronary artery disease), native coronary artery 04/12/2007    Priority: High   Hyperlipidemia 04/16/2007    Priority: Medium    Hypothyroidism 04/12/2007    Priority: Medium    Essential hypertension 04/12/2007    Priority: Medium    Obstructive sleep apnea 04/12/2007    Priority: Medium    Former smoker 08/11/2016    Priority: Low   MCI (mild cognitive impairment) 01/19/2016    Priority: Low   Primary osteoarthritis of left knee 02/09/2015    Priority: Low   Allergic rhinitis 06/25/2012    Priority: Low   GERD (gastroesophageal reflux disease) 08/09/2011    Priority: Low   VITAMIN B12 DEFICIENCY 11/29/2010    Priority: Low   Thrombocytopenia (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 difficile colitis 04/17/2019   Dehydration 04/15/2019    Diarrhea 04/15/2019   ARF (acute renal failure) ([place]) 04/15/2019   Chronic combined systolic and diastolic CHF (congestive heart failure) ([place]) 04/15/2019   Hypokalemia 04/15/2019   AKI (acute kidney injury) ([place])    Acute on chronic combined systolic and diastolic heart failure ([place]) 02/07/2018    Medications- reviewed and updated Current Outpatient Medications  Medication Sig Dispense Refill   acetaminophen (TYLENOL) 500 MG tablet Take 1,000 mg by mouth every 6 (six) hours as needed for mild pain or moderate pain.     apixaban (ELIQUIS) 5 MG TABS tablet Take 1 tablet (5 mg total) by mouth 2 (two) times daily. 180 tablet 3   APRISO 0.375 g 24 hr capsule Take 4 capsules (1.5 g total) by mouth daily. 120 capsule 5   atorvastatin (LIPITOR) 20 MG tablet TAKE ONE TABLET (20MG TOTAL) BY MOUTH DAILY 30 tablet 5   carvedilol (COREG) 25 MG tablet TAKE ONE TABLET BY MOUTH TWICE A DAY 180 tablet 3   clopidogrel (PLAVIX) 75 MG tablet TAKE ONE TABLET (75MG TOTAL) BY MOUTH DAILY 90 tablet 3   Clotrimazole 1 % OINT Apply to penis twice daily after saline bath/rinse and pat dry (Patient taking differently: Apply 1 application. topically daily as needed (Apply to penis  after saline bath/rinse and pat dry).) 56.7 g 0   Coenzyme Q10 (COQ10 PO) Take 1 capsule by mouth daily with supper.     Continuous Blood Gluc Sensor (FREESTYLE LIBRE 3 SENSOR) MISC  Apply every 14 days and use with application on phone 6 each 3   cyanocobalamin 1000 MCG tablet Take 1,000 mcg by mouth daily.     dicyclomine (BENTYL) 10 MG capsule TAKE ONE CAPSULE (10MG TOTAL) BY MOUTH TWO TIMES DAILY AS NEEDED FOR DIARRHEA OR ABDOMINAL CRAMPING 90 capsule 3   fish oil-omega-3 fatty acids 1000 MG capsule Take 1 g by mouth daily with supper.     furosemide (LASIX) 40 MG tablet Take 1 tablet (40 mg total) by mouth 2 (two) times daily. 180 tablet 3   glucose blood test strip Use as instructed to test 4 time daily 400 each 3   isosorbide  mononitrate (IMDUR) 60 MG 24 hr tablet TAKE ONE (1) TABLET BY MOUTH EVERY DAY 90 tablet 3   JARDIANCE 10 MG TABS tablet TAKE ONE (1) TABLET BY MOUTH EVERY DAY 90 tablet 3   metFORMIN (GLUCOPHAGE) 500 MG tablet TAKE ONE TABLET (500MG TOTAL) BY MOUTH TWO TIMES DAILY WITH A MEAL 180 tablet 3   mupirocin ointment (BACTROBAN) 2 % Apply 1 application. topically 2 (two) times daily. 22 g 1   saccharomyces boulardii (FLORASTOR) 250 MG capsule Take 1 capsule (250 mg total) by mouth 2 (two) times daily.     sacubitril-valsartan (ENTRESTO) 97-103 MG Take 1 tablet by mouth 2 (two) times daily. 180 tablet 3   sitaGLIPtin (JANUVIA) 100 MG tablet TAKE ONE (1) TABLET BY MOUTH EVERY DAY 90 tablet 3   spironolactone (ALDACTONE) 25 MG tablet TAKE ONE (1) TABLET BY MOUTH EVERY DAY 90 tablet 3   SYNTHROID 50 MCG tablet TAKE ONE TABLET BY MOUTH DAILY BEFORE BREAKFAST 90 tablet 3   triamcinolone (KENALOG) 0.1 % Apply 1 application topically 2 (two) times daily. For 7-10 days maximum (Patient taking differently: Apply 1 application. topically daily as needed (Skin). For 7-10 days maximum) 80 g 0   No current facility-administered medications for this visit.     Objective:  BP 104/64   Pulse 72   Temp 98.1 F (36.7 C)   Ht 5' 8"  (1.727 m)   Wt 222 lb 6.4 oz (100.9 kg)   SpO2 99%   BMI 33.82 kg/m  Gen: NAD, resting comfortably CV: RRR no murmurs rubs or gallops Lungs: CTAB no crackles, wheeze, rhonchi Ext: no edema Skin: warm, dry    Assessment and Plan   #Osteomyelitis toe S: Patient with right second toe osteomyelitis with diabetic ulceration and forefoot abscess and had amputation through MTP on 01/27/2022 as a result as well as incision and drainage of abscess.  He had not responded to wound care visits previously with first visit here without issue in late March with Dr. Vincenza Hews to podiatry A/P: recent amputation- recovering well- continue podiatry follow up   #Congestive heart failure with  ejection fraction of 25% during hospitalization 2022 S: Medication: Lasix 40 mg twice daily, Aldactone 25 mg, Entresto, Jardiance 1m A/P: CHF appears stable/euvolemic- continue current meds   #%CAD/atrial fibrillation/hyperlipidemia S: Compliant with atorvastatin 264mLDL has been at goal under 70  I have prescribed his Plavix-with his CAD history- reports compliance .  He is also on eliquis for anticoagulation due to atrial fibrillation-compliant. Patient reports cardiology wants him on both medications.  Patient is not on carvedilol for rate control  No chest pain or shortness of breath Lab Results  Component Value Date   CHOL 79 03/03/2021   HDL 55.90 03/03/2021   LDLCALC 6 03/03/2021   LDLDIRECT 18.0 06/13/2017   TRIG  86.0 03/03/2021   CHOLHDL 1 03/03/2021   A/P: CAD asymptomatic- continue current meds . Lipids very well controlled- due for repeat  A fib is rate controlled on coreg. Anticoagulated on eliquis- but he received a coverage issue letter- may only be covered for 30 days at a time- reaching out to [name]. His nurse Sarah called pharmacy and was billed as one month- later corrected and now covered  #hypertension S: Compliant with Coreg 25 mg twice a day, Imdur 60 mg, Entresto 97-103 mg, spironolactone 25 mg, Lasix 40 mg BP Readings from Last 3 Encounters:  03/03/22 104/64  01/27/22 112/62  01/05/22 124/72  A/P:  blood pressure is running lower- he is eating healthier- if conitnues to trend down we may need to reduce blood pressure medicine    #% Hypothyroidism S: Compliant with Synthroid 50 mcg-most recently filled by Dr. Cruzita Lederer Lab Results  Component Value Date   TSH 1.15 09/01/2021  A/P: hopefully stable- update tsh today. Continue current meds for now - I am willing to prescribe Synthroid if needed   % Ulcerative colitis S: Followed by GI- compliant with mesalamine with provider in [place]. No rectal bleeding or abdominal cramping A/P:  Controlled.  Continue current medications.    #Diabetes-followed by Dr. Cruzita Lederer S:medication: Celesta Gentile 115m, metformin  5024mBID, jardiance 1055mab Results  Component Value Date   HGBA1C 6.2 (A) 11/18/2021   HGBA1C 6.9 (A) 07/14/2021   HGBA1C 5.9 (H) 12/27/2020  A/P: a1c has been doing well- continue current meds and endocrine follow up. He requests freestyle 3 sensor- we have sent this in and hope that it is covered   # Low b12- on sublingual b12- update levels- prior had been low  Recommended follow up: Return in about 6 months (around 09/03/2022) for followup or sooner if needed.Schedule b4 you leave. Future Appointments  Date Time Provider DepP[place]/22/2023  1:30 PM [place], GarClent DemarkD [facility] None  04/13/2022  3:45 PM MayGardiner BarefootPM [facility]  05/26/2022  4:00 PM GhePhilemon KingdomD [facility] None  11/25/2022  3:15 PM [facility] HEALTH COACH [facility]   Lab/Order associations: NOT fasting   ICD-10-CM   1. Atherosclerotic heart disease of native coronary artery with other forms of angina pectoris (H[place]ronic I25.118     2. Permanent atrial fibrillation (HCC)  I48.21     3. Hypothyroidism, unspecified type  E03.9 TSH    4. Hyperlipidemia, unspecified hyperlipidemia type  E78.5 CBC with Differential/Platelet    Comprehensive metabolic panel    Lipid panel    5. Essential hypertension  I10     6. High risk medication use  Z79.899     7. B12 deficiency  E53.8 Vitamin B12    8. Chronic systolic congestive heart failure, NYHA class 2 (HCC)  I50.22       Meds ordered this encounter  Medications   Continuous Blood Gluc Sensor (FREESTYLE LIBRE 3 SENSOR) MISC    Sig: Apply every 14 days and use with application on phone    Dispense:  6 each    Refill:  3    E11.51    Return precautions advised.  SteGarret ReddishD

## 2022-03-05 ENCOUNTER — Other Ambulatory Visit: Payer: Self-pay | Admitting: Internal Medicine

## 2022-03-18 ENCOUNTER — Other Ambulatory Visit: Payer: Self-pay | Admitting: Family Medicine

## 2022-03-18 DIAGNOSIS — E785 Hyperlipidemia, unspecified: Secondary | ICD-10-CM

## 2022-03-24 ENCOUNTER — Other Ambulatory Visit: Payer: Self-pay | Admitting: Physician Assistant

## 2022-03-31 ENCOUNTER — Encounter (INDEPENDENT_AMBULATORY_CARE_PROVIDER_SITE_OTHER): Payer: Medicare Other | Admitting: Ophthalmology

## 2022-04-13 ENCOUNTER — Ambulatory Visit (INDEPENDENT_AMBULATORY_CARE_PROVIDER_SITE_OTHER): Payer: Medicare Other | Admitting: Podiatry

## 2022-04-13 ENCOUNTER — Encounter: Payer: Self-pay | Admitting: Podiatry

## 2022-04-13 DIAGNOSIS — B351 Tinea unguium: Secondary | ICD-10-CM | POA: Diagnosis not present

## 2022-04-13 DIAGNOSIS — D696 Thrombocytopenia, unspecified: Secondary | ICD-10-CM

## 2022-04-13 DIAGNOSIS — S98131S Complete traumatic amputation of one right lesser toe, sequela: Secondary | ICD-10-CM

## 2022-04-13 DIAGNOSIS — M79674 Pain in right toe(s): Secondary | ICD-10-CM | POA: Diagnosis not present

## 2022-04-13 DIAGNOSIS — S98139A Complete traumatic amputation of one unspecified lesser toe, initial encounter: Secondary | ICD-10-CM | POA: Insufficient documentation

## 2022-04-13 DIAGNOSIS — M79675 Pain in left toe(s): Secondary | ICD-10-CM | POA: Diagnosis not present

## 2022-04-13 NOTE — Progress Notes (Signed)
This patient returns to my office for at risk foot care.  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  Patient has amputation second toe right foot.  This patient is unable to cut nails himself since the patient cannot reach his nails.These nails are painful walking and wearing shoes.  This patient presents for at risk foot care today.  General Appearance  Alert, conversant and in no acute stress.  Vascular  Dorsalis pedis and posterior tibial  pulses are weakly  palpable  bilaterally.  Capillary return is within normal limits  bilaterally. Temperature is within normal limits  bilaterally.  Neurologic  Senn-Weinstein monofilament wire test within normal limits  bilaterally. Muscle power within normal limits bilaterally.  Nails Thick disfigured discolored nails with subungual debris  from hallux to fifth toes  left and 1,3-5 right.. No evidence of bacterial infection or drainage bilaterally.  Orthopedic  No limitations of motion  feet .  No crepitus or effusions noted.  No bony pathology or digital deformities noted.  Skin  normotropic skin with no porokeratosis noted bilaterally.  No signs of infections or ulcers noted.     Onychomycosis  Pain in right toes  Pain in left toes  Consent was obtained for treatment procedures.   Mechanical debridement of nails 1-5  bilaterally performed with a nail nipper.  Filed with dremel without incident.    Return office visit   3 months                   Told patient to return for periodic foot care and evaluation due to potential at risk complications.   Gardiner Barefoot DPM

## 2022-04-14 ENCOUNTER — Encounter: Payer: Self-pay | Admitting: Internal Medicine

## 2022-04-20 ENCOUNTER — Other Ambulatory Visit: Payer: Self-pay | Admitting: Gastroenterology

## 2022-04-20 DIAGNOSIS — K5100 Ulcerative (chronic) pancolitis without complications: Secondary | ICD-10-CM

## 2022-04-20 DIAGNOSIS — A0472 Enterocolitis due to Clostridium difficile, not specified as recurrent: Secondary | ICD-10-CM

## 2022-04-20 DIAGNOSIS — R197 Diarrhea, unspecified: Secondary | ICD-10-CM

## 2022-04-25 ENCOUNTER — Encounter: Payer: Self-pay | Admitting: Pulmonary Disease

## 2022-04-25 ENCOUNTER — Ambulatory Visit (INDEPENDENT_AMBULATORY_CARE_PROVIDER_SITE_OTHER): Payer: Medicare Other | Admitting: Pulmonary Disease

## 2022-04-25 VITALS — BP 112/70 | HR 74 | Ht 68.0 in | Wt 222.8 lb

## 2022-04-25 DIAGNOSIS — Z9989 Dependence on other enabling machines and devices: Secondary | ICD-10-CM

## 2022-04-25 DIAGNOSIS — G4733 Obstructive sleep apnea (adult) (pediatric): Secondary | ICD-10-CM

## 2022-04-25 DIAGNOSIS — I2510 Atherosclerotic heart disease of native coronary artery without angina pectoris: Secondary | ICD-10-CM | POA: Diagnosis not present

## 2022-04-25 NOTE — Patient Instructions (Signed)
Follow up in 1 year in the [place] office

## 2022-04-25 NOTE — Progress Notes (Signed)
[facility]  Chief Complaint  Patient presents with   Follow-up    Pt f/u, states he's getting 9.5hrs of sleep a night, no tiredness during the day. CPAP is working well    Constitutional:  BP 112/70 (BP Location: Right Arm, Patient Position: Sitting, Cuff Size: Normal)   Pulse 74   Ht 5' 8"  (1.727 m)   Wt 222 lb 12.8 oz (101.1 kg)   SpO2 97%   BMI 33.88 kg/m   Past Medical History:  A fib, B12 deficiency, C diff, CAD, Combined CHF, Macular edema, DM type 2, Diverticulosis, GERD, HLD, HTN, Hypothyroidism, CVA, Ulcerative colitis, Ventricular tachycardia  Past Surgical History:  He  has a past surgical history that includes Coronary stent placement (2008); Cardiac catheterization (10/2002); Tonsillectomy; Cardiac catheterization (N/A, 10/28/2015); RIGHT/LEFT HEART CATH AND CORONARY ANGIOGRAPHY (N/A, 06/01/2018); Cataract extraction; Colonoscopy (2012); and I & D extremity (Right, 01/27/2022).  Brief Summary:  [name] is a 76 y.o. male former smoker with obstructive sleep apnea.      Subjective:   He is doing well with CPAP.  Not issues with mask fit.  Sleeping well.  Not having dry mouth or sore throat.  Physical Exam:   Appearance - well kempt   ENMT - no sinus tenderness, no oral exudate, no LAN, Mallampati 3 airway, no stridor, wears dentures  Respiratory - equal breath sounds bilaterally, no wheezing or rales  CV - s1s2 regular rate and rhythm, no murmurs  Ext - no clubbing, no edema  Skin - no rashes  Psych - normal mood and affect    Sleep Tests:  PSG 5//07/09 >> AHI 96, SpO2 low 74%, CPAP 13 cm H2O CPAP 12/26/15 to 02/23/16 >> used on 60 of 60 nights with average 7 hrs 16 min.  Average AHI 1.8 with CPAP 13 cm H2O  Cardiac Tests:  Echo 12/28/20 >> EF 50 to 55%, mild LVH, mild LA dilation  Social History:  He  reports that he quit smoking about 56 years ago. His smoking use included cigarettes. He has a  5.00 pack-year smoking history. He has never used smokeless tobacco. He reports that he does not drink alcohol and does not use drugs.  Family History:  His family history includes Heart attack in his mother; Heart disease in his father; Hypertension in his brother; Obesity in his brother; Stomach cancer in his maternal aunt and paternal grandmother.     Assessment/Plan:   Obstructive sleep apnea. - he is compliant with therapy and reports benefit from CPAP - he uses [facility] for his DME - current CPAP was ordered on 01/27/21 - continue auto CPAP 8 to 15 cm H2O - will get a copy of his more recent download and call him with results  CAD, chronic combined CHF, HTN, Chronic A fib, VT. - followed by Dr. Sherren Mocha and Dr. Pierre Bali with [facility]  Ulcerative colitis. - followed by Dr. Gala Romney with [facility]  Time Spent Involved in Patient Care on Day of Examination:  27 minutes  Follow up:   Patient Instructions  Follow up in 1 year in the [place] office  Medication List:   Allergies as of 04/25/2022       Reactions   Clindamycin/lincomycin    Developed C. difficile colitis 1 month after use        Medication List        Accurate as of April 25, 2022  4:15  PM. If you have any questions, ask your nurse or doctor.          acetaminophen 500 MG tablet Commonly known as: TYLENOL Take 1,000 mg by mouth every 6 (six) hours as needed for mild pain or moderate pain.   apixaban 5 MG Tabs tablet Commonly known as: ELIQUIS Take 1 tablet (5 mg total) by mouth 2 (two) times daily.   Apriso 0.375 g 24 hr capsule Generic drug: mesalamine Take 4 capsules (1.5 g total) by mouth daily.   atorvastatin 20 MG tablet Commonly known as: LIPITOR TAKE ONE TABLET (20MG TOTAL) BY MOUTH DAILY   carvedilol 25 MG tablet Commonly known as: COREG Take 1 tablet (25 mg total) by mouth 2 (two) times daily. Please call [phone] to schedule an  appointment for future refills. Thank you.   clopidogrel 75 MG tablet Commonly known as: PLAVIX TAKE ONE TABLET (75MG TOTAL) BY MOUTH DAILY   Clotrimazole 1 % Oint Apply to penis twice daily after saline bath/rinse and pat dry What changed:  how much to take how to take this when to take this reasons to take this additional instructions   COQ10 PO Take 1 capsule by mouth daily with supper.   cyanocobalamin 1000 MCG tablet Take 1,000 mcg by mouth daily.   dicyclomine 10 MG capsule Commonly known as: BENTYL TAKE ONE CAPSULE (10MG TOTAL) BY MOUTH TWO TIMES DAILY AS NEEDED FOR DIARRHEA OR ABDOMINAL CRAMPING   Entresto 97-103 MG Generic drug: sacubitril-valsartan Take 1 tablet by mouth 2 (two) times daily.   fish oil-omega-3 fatty acids 1000 MG capsule Take 1 g by mouth daily with supper.   FreeStyle Libre 3 Sensor Misc Apply every 14 days and use with application on phone   furosemide 40 MG tablet Commonly known as: LASIX Take 1 tablet (40 mg total) by mouth 2 (two) times daily.   glucose blood test strip Use as instructed to test 4 time daily   isosorbide mononitrate 60 MG 24 hr tablet Commonly known as: IMDUR TAKE ONE (1) TABLET BY MOUTH EVERY DAY   Jardiance 10 MG Tabs tablet Generic drug: empagliflozin TAKE ONE (1) TABLET BY MOUTH EVERY DAY   metFORMIN 500 MG tablet Commonly known as: GLUCOPHAGE TAKE ONE TABLET (500MG TOTAL) BY MOUTH TWO TIMES DAILY WITH A MEAL   mupirocin ointment 2 % Commonly known as: BACTROBAN Apply 1 application. topically 2 (two) times daily.   saccharomyces boulardii 250 MG capsule Commonly known as: Florastor Take 1 capsule (250 mg total) by mouth 2 (two) times daily.   sitaGLIPtin 100 MG tablet Commonly known as: Januvia TAKE ONE (1) TABLET BY MOUTH EVERY DAY   spironolactone 25 MG tablet Commonly known as: ALDACTONE TAKE ONE (1) TABLET BY MOUTH EVERY DAY   Synthroid 50 MCG tablet Generic drug: levothyroxine TAKE ONE  TABLET BY MOUTH DAILY BEFORE BREAKFAST   triamcinolone cream 0.1 % Commonly known as: KENALOG Apply 1 application topically 2 (two) times daily. For 7-10 days maximum What changed:  when to take this reasons to take this        Signature:  Chesley Mires, MD [place] Pager - [phone] 04/25/2022, 4:15 PM

## 2022-04-26 ENCOUNTER — Ambulatory Visit (INDEPENDENT_AMBULATORY_CARE_PROVIDER_SITE_OTHER): Payer: Medicare Other | Admitting: *Deleted

## 2022-04-26 ENCOUNTER — Telehealth: Payer: Self-pay | Admitting: Family Medicine

## 2022-04-26 DIAGNOSIS — E1151 Type 2 diabetes mellitus with diabetic peripheral angiopathy without gangrene: Secondary | ICD-10-CM

## 2022-04-26 DIAGNOSIS — I10 Essential (primary) hypertension: Secondary | ICD-10-CM

## 2022-04-26 DIAGNOSIS — I5022 Chronic systolic (congestive) heart failure: Secondary | ICD-10-CM

## 2022-04-26 NOTE — Patient Instructions (Signed)
CONGRATULATIONS ON COMPLETING YOUR GOALS.  IT AS BEEN A PLEASURE WORKING WITH AND TALKING TO YOU.  IF  NEEDS [facility] IN THE FUTURE PLEASE DO NOT HESITATE TO CONTACT ME  [phone]  Hubert Azure RN, MSN RN Care Management Coordinator  [place] [phone] [email or web address]

## 2022-04-26 NOTE — Chronic Care Management (AMB) (Signed)
  Care Management   Follow Up Note   04/26/2022 Name: [name] MRN: [id number] DOB: 05/02/1946   Referred by: Marin Olp, MD Reason for referral : Case Closure   Successful outreach to patient.  States he is doing well without complaints.  Discussed goals and both agree patient has met goals of the program.  Follow Up Plan:    The patient has been provided with contact information for the care management team and has been advised to call with any health-related questions or concerns.  No further follow up required: as personal goals have been met.  Hubert Azure RN, MSN RN Care Management Coordinator  [place] [phone] [email or web address]

## 2022-04-26 NOTE — Telephone Encounter (Signed)
Patient returned [name]'s call. Patient requests to be called at ph# [phone].

## 2022-05-09 DIAGNOSIS — I5022 Chronic systolic (congestive) heart failure: Secondary | ICD-10-CM

## 2022-05-09 DIAGNOSIS — E1151 Type 2 diabetes mellitus with diabetic peripheral angiopathy without gangrene: Secondary | ICD-10-CM | POA: Diagnosis not present

## 2022-05-09 DIAGNOSIS — I10 Essential (primary) hypertension: Secondary | ICD-10-CM | POA: Diagnosis not present

## 2022-05-20 ENCOUNTER — Other Ambulatory Visit: Payer: Self-pay | Admitting: Gastroenterology

## 2022-05-20 ENCOUNTER — Other Ambulatory Visit: Payer: Self-pay | Admitting: Family Medicine

## 2022-05-20 DIAGNOSIS — K5100 Ulcerative (chronic) pancolitis without complications: Secondary | ICD-10-CM

## 2022-05-26 ENCOUNTER — Encounter: Payer: Self-pay | Admitting: Internal Medicine

## 2022-05-26 ENCOUNTER — Ambulatory Visit (INDEPENDENT_AMBULATORY_CARE_PROVIDER_SITE_OTHER): Payer: Medicare Other | Admitting: Internal Medicine

## 2022-05-26 VITALS — BP 110/68 | HR 75 | Ht 68.0 in | Wt 220.4 lb

## 2022-05-26 DIAGNOSIS — E1151 Type 2 diabetes mellitus with diabetic peripheral angiopathy without gangrene: Secondary | ICD-10-CM

## 2022-05-26 DIAGNOSIS — I2510 Atherosclerotic heart disease of native coronary artery without angina pectoris: Secondary | ICD-10-CM | POA: Diagnosis not present

## 2022-05-26 DIAGNOSIS — E039 Hypothyroidism, unspecified: Secondary | ICD-10-CM | POA: Diagnosis not present

## 2022-05-26 DIAGNOSIS — E785 Hyperlipidemia, unspecified: Secondary | ICD-10-CM | POA: Diagnosis not present

## 2022-05-26 LAB — POCT GLYCOSYLATED HEMOGLOBIN (HGB A1C): Hemoglobin A1C: 6 % — AB (ref 4.0–5.6)

## 2022-05-26 NOTE — Progress Notes (Signed)
Patient ID: BELAL SCALLON, male   DOB: 09-30-1946, 76 y.o.   MRN: [id number]  HPI: HOWARD BUNTE is a 76 y.o.-year-old male, returning for DM2, dx 2004, non-insulin-dependent, uncontrolled, with complications (CAD, s/p MI, s/p 2nd R toe amputation b/c osteomyelitis). Last visit 6 months ago. He has [facility] as supplemental and [employer] for meds in 10/2015.  Interim history: No increased urination, blurry vision, nausea, chest pain. Since last visit, he had toe osteomyelitis and had to have it amputated in 01/2022.  He is now seeing Dr. Prudence Davidson for onychomycosis and further foot care.  Reviewed HbA1c levels: Lab Results  Component Value Date   HGBA1C 6.2 (A) 11/18/2021   HGBA1C 6.9 (A) 07/14/2021   HGBA1C 5.9 (H) 12/27/2020  02/27/2019: HbA1c calculated from fructosamine is better: 6.45%. 10/24/2018: HbA1c calculated from fructosamine is slightly higher than the one from 02/2018, at 6.6%. 02/13/2018: The HbA1c calculated from the fructosamine is excellent, at 6.3%! 10/16/2017: HbA1c calculated from fructosamine is: 7.3% (higher). 06/16/2017: HbA1c calculated from the fructosamine is stable, at 6.8%.  12/08/2016: HbA1c calculated from the fructosamine is better, at 6.86%. 08/10/2016: HbA1c calculated from  fructosamine is 7.88% 05/10/2016: HbA1c calculated from fructosamine is 7.6% 01/06/2016: HbA1c calculated from fructosamine is 6.6% 09/06/2016: HbA1c calculated from fructosamine is 7.0% 06/01/2015: HbA1c calculated from fructosamine is 7.0% 02/23/2015: HbA1c calculated from fructosamine is 6.88% 10/15/2014: HbA1c calculated from fructosamine is 7.17% He started to change his eating habits since 03/2013.   He is on: - Metformin 1000 mg 2x a day >> 500 mg 2x a day  - Januvia 100 mg in am  - Jardiance 10 mg in am He does not miss doses. He was on glipizide 10 mg twice a day in the past but this was stopped during hospitalization in 12/2020.  Pt checks his sugars 4 times a day per his  excellent log: - am: 112-136 >> 110-133, 137 >> 110-130, 136 >> 109-133, 135 - 2h after breakfast: 138-160, 170 >> 144-163>> 142-162 - before lunch 144 >> 110-120s >> n/c   - before dinner: 112-127 >> 89-111 >> 96-114 >> 101-133 - 2h after dinner: 140-168, 173 >> 132-145, 164 >> 144-162 He has hypoglycemia awareness in the 60s. Highest: 287 >> .Marland Kitchen. 173 >> 163 >> 162.  His wife is a Radiographer, therapeutic.  He saw [name] (nutritionist).  -+ Mild CKD, last BUN/creatinine:  Lab Results  Component Value Date   BUN 16 03/03/2022   CREATININE 1.30 03/03/2022  On Entresto.  -+ HL:  last set of lipids: Lab Results  Component Value Date   CHOL 98 03/03/2022   HDL 67.20 03/03/2022   LDLCALC 6 03/03/2022   LDLDIRECT 18.0 06/13/2017   TRIG 121.0 03/03/2022   CHOLHDL 1 03/03/2022  He was in the Reveal Study x 4 years >> stopped. On Lipitor 20 and omega-3 fatty acids.  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 Zadie Rhine.  He has a history of cataract surgery. Coming up this mo.  - no numbness and tingling in his feet.  Last foot exam 04/13/2022.  He sees Dr. Prudence Davidson.  He is compliant with CPAP for OSA, also has A. Fib, IBD -ulcerative pancolitis, GERD, also, obesity. In 2019 she was also diagnosed with CHF -EF of 25 to 30%.  His Lasix was increased to twice a day(takes the second dose as needed). Cardiac cath >> no significant obstruction.  He was started on Entresto. He was admitted 3/20-24/2022 for hypoxemic respiratory failure in the setting of CHF after not taking his Lasix for a few weeks due to increased urination.  At that time, he was unresponsive, had A. fib with RVR at that time and a glucose level at 260.  He had to be intubated.  He recovered well afterwards.  He has a history of very low vitamin B12, improved: Lab Results  Component Value Date   VITAMINB12 734 03/03/2022   VITAMINB12 1,256 (H)  09/02/2020   VITAMINB12 103 (L) 02/24/2020   VITAMINB12 267 05/01/2012   VITAMINB12 201 (L) 11/23/2010   She was started on B12 injections q. weekly for 4 weeks then 1000 mcg p.o. daily by PCP  Hypothyroidism:  Pt is on levothyroxine 50 mcg daily, taken: - in am - fasting - at least 30 min from b'fast - no Ca, Fe, MVI, PPIs - not on Biotin  Latest TSH was reviewed and this was normal: Lab Results  Component Value Date   TSH 1.52 03/03/2022   Pt denies: - feeling nodules in neck - hoarseness - dysphagia - choking  ROS: + See HPI  I reviewed pt's medications, allergies, PMH, social hx, family hx, and changes were documented in the history of present illness. Otherwise, unchanged from my initial visit note.  Past Medical History:  Diagnosis Date   B12 deficiency    per patient previously taking shots   C. difficile colitis 04/17/2019   C. difficile diarrhea    CAD (coronary artery disease), native coronary artery 04/12/2007   Chronic atrial fibrillation ([place])    initial diagnoses 2012   Chronic combined systolic and diastolic CHF (congestive heart failure) ([place]) 04/15/2019   CKD (chronic kidney disease), stage III ([place]) 04/15/2019   CORONARY ARTERY DISEASE 04/12/2007   2 stents last in 2006.    Cystoid macular edema of right eye 01/16/2020   Cystoid macular edema of right eye 01/16/2020   DIABETES MELLITUS, TYPE II 04/16/2007   Diverticulosis of colon (without mention of hemorrhage)    GERD (gastroesophageal reflux disease) 08/09/2011   Hyperlipidemia 04/16/2007   10/24 reveal study end. Atorvastatin 52m   HYPERTENSION 04/12/2007   HYPOTHYROIDISM 04/12/2007   Ischemic cardiomyopathy    Morbid obesity ([facility] 08/09/2011   MYOCARDIAL INFARCTION, HX OF 04/12/2007   Obstructive sleep apnea 04/12/2007   02/2011 - AHI 96/h CPAP 13, Lg FF      Stroke ([facility])    Type II diabetes mellitus with peripheral circulatory disorder ([facility] 04/16/2007   Dr. GCruzita Lederermanages. Uses fructosamine.      ULCERATIVE COLITIS, LEFT SIDED 11/23/2010   Ulcerative pancolitis without complication ([facility] 138/75/6433  Ulcerative Colitis. Mesalamine.    VITAMIN B12 DEFICIENCY 11/29/2010   Past Surgical History:  Procedure Laterality Date   CARDIAC CATHETERIZATION  10/2002   STENT. 2 stents Dr. BMaurene Capes  CARDIAC CATHETERIZATION N/A 10/28/2015   Procedure: Right/Left Heart Cath and Coronary Angiography;  Surgeon: MSherren Mocha MD;  [facility];  Service: Cardiovascular;  Laterality: N/A;   CATARACT EXTRACTION     2021 bilateral eyes    COLONOSCOPY  2012   Dr. PSharlett Iles findings consistent with universal UC s/p random biopsies, no active bleeding, polyps, masses.  Pathology with mild chronic active colitis.  Repeat in 5 years.   CORONARY STENT PLACEMENT  2008   LAD    I & D EXTREMITY Right 01/27/2022   Procedure: RIGHT SECOND METARTSOPHALANGEAL JOINT DISARTICULATION,  IRRIGATION AND DEBRIDEMENT OF ABSCESS RIGHT FOOT;  Surgeon: Erle Crocker, MD;  Location: [facility];  Service: Orthopedics;  Laterality: Right;   RIGHT/LEFT HEART CATH AND CORONARY ANGIOGRAPHY N/A 06/01/2018   Procedure: RIGHT/LEFT HEART CATH AND CORONARY ANGIOGRAPHY;  Surgeon: Sherren Mocha, MD;  Location: [facility];  Service: Cardiovascular;  Laterality: N/A;   TONSILLECTOMY     Social History   Socioeconomic History   Marital status: Married    Spouse name: Not on file   Number of children: 1   Years of education: Not on file   Highest education level: Not on file  Occupational History   Occupation: RETIRED    Employer: RETIRED  Tobacco Use   Smoking status: Former    Packs/day: 1.00    Years: 5.00    Total pack years: 5.00    Types: Cigarettes    Quit date: 04/13/1966    Years since quitting: 56.1   Smokeless tobacco: Never  Vaping Use   Vaping Use: Never used  Substance and Sexual Activity   Alcohol use: No    Alcohol/week: 0.0 standard drinks of alcohol    Comment: no more beer   Drug  use: No   Sexual activity: Not on file  Other Topics Concern   Not on file  Social History Narrative   Cardiorehab 3 days a week. 45 minutes to an hour-stationary bike.    GRANDDAUGHTER (MS. 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       Married for 37 years in 2015, married previously for 14 years. Daughter with first wife and 3 grandkids.    Lives alone with wife. Get to see grandkids a lot. New grandchild in middle of 49      Hobbies-yardwork, previously liked to hunt and fish, does some target shooting      Tourist information centre manager for 6 yrs   Social Determinants of Health   Financial Resource Strain: Low Risk  (11/12/2021)   Overall Financial Resource Strain (CARDIA)    Difficulty of Paying Living Expenses: Not hard at all  Food Insecurity: No Food Insecurity (11/12/2021)   Hunger Vital Sign    Worried About Running Out of Food in the Last Year: Never true    [place] in the Last Year: Never true  Transportation Needs: No Transportation Needs (11/12/2021)   PRAPARE - Hydrologist (Medical): No    Lack of Transportation (Non-Medical): No  Physical Activity: Inactive (11/12/2021)   Exercise Vital Sign    Days of Exercise per Week: 0 days    Minutes of Exercise per Session: 0 min  Stress: No Stress Concern Present (11/12/2021)   [place]    Feeling of Stress : Not at all  Social Connections: [place] (11/12/2021)   Social Connection and Isolation Panel [NHANES]    Frequency of Communication with Friends and Family: More than three times a week    Frequency of Social Gatherings with Friends and Family: More than three times a week    Attends Religious Services: More than 4 times per year    Active Member of [employer] or Organizations: Yes    Attends Archivist Meetings: 1  to 4 times per year    Marital Status: Married  Human resources officer Violence:  Not At Risk (11/12/2021)   Humiliation, Afraid, Rape, and Kick questionnaire    Fear of Current or Ex-Partner: No    Emotionally Abused: No    Physically Abused: No    Sexually Abused: No   Current Outpatient Medications on File Prior to Visit  Medication Sig Dispense Refill   acetaminophen (TYLENOL) 500 MG tablet Take 1,000 mg by mouth every 6 (six) hours as needed for mild pain or moderate pain.     apixaban (ELIQUIS) 5 MG TABS tablet Take 1 tablet (5 mg total) by mouth 2 (two) times daily. 180 tablet 3   APRISO 0.375 g 24 hr capsule TAKE 4 CAPSULES (1.5 GRAMS TOTAL) BY MOUTH DAILY 120 capsule 5   atorvastatin (LIPITOR) 20 MG tablet TAKE ONE TABLET (20MG TOTAL) BY MOUTH DAILY 30 tablet 5   carvedilol (COREG) 25 MG tablet Take 1 tablet (25 mg total) by mouth 2 (two) times daily. Please call [phone] to schedule an appointment for future refills. Thank you. 180 tablet 0   clopidogrel (PLAVIX) 75 MG tablet TAKE ONE TABLET (75MG TOTAL) BY MOUTH DAILY 90 tablet 3   Clotrimazole 1 % OINT Apply to penis twice daily after saline bath/rinse and pat dry (Patient taking differently: Apply 1 application  topically daily as needed (Apply to penis  after saline bath/rinse and pat dry).) 56.7 g 0   Coenzyme Q10 (COQ10 PO) Take 1 capsule by mouth daily with supper.     Continuous Blood Gluc Sensor (FREESTYLE LIBRE 3 SENSOR) MISC Apply every 14 days and use with application on phone 6 each 3   cyanocobalamin 1000 MCG tablet Take 1,000 mcg by mouth daily.     dicyclomine (BENTYL) 10 MG capsule TAKE ONE CAPSULE (10MG TOTAL) BY MOUTH TWO TIMES DAILY AS NEEDED FOR DIARRHEA OR ABDOMINAL CRAMPING 90 capsule 1   fish oil-omega-3 fatty acids 1000 MG capsule Take 1 g by mouth daily with supper.     furosemide (LASIX) 40 MG tablet Take 1 tablet (40 mg total) by mouth 2 (two) times daily. 180 tablet 3   glucose blood test strip Use as  instructed to test 4 time daily 400 each 3   isosorbide mononitrate (IMDUR) 60 MG 24 hr tablet TAKE ONE (1) TABLET BY MOUTH EVERY DAY 90 tablet 3   JARDIANCE 10 MG TABS tablet TAKE ONE (1) TABLET BY MOUTH EVERY DAY 90 tablet 3   metFORMIN (GLUCOPHAGE) 500 MG tablet TAKE ONE TABLET (500MG TOTAL) BY MOUTH TWO TIMES DAILY WITH A MEAL 180 tablet 3   mupirocin ointment (BACTROBAN) 2 % Apply 1 application. topically 2 (two) times daily. 22 g 1   saccharomyces boulardii (FLORASTOR) 250 MG capsule Take 1 capsule (250 mg total) by mouth 2 (two) times daily.     sacubitril-valsartan (ENTRESTO) 97-103 MG Take 1 tablet by mouth 2 (two) times daily. 180 tablet 3   sitaGLIPtin (JANUVIA) 100 MG tablet TAKE ONE (1) TABLET BY MOUTH EVERY DAY 90 tablet 3   spironolactone (ALDACTONE) 25 MG tablet TAKE ONE (1) TABLET BY MOUTH EVERY DAY 90 tablet 3   SYNTHROID 50 MCG tablet TAKE ONE TABLET BY MOUTH DAILY BEFORE BREAKFAST 90 tablet 3   triamcinolone (KENALOG) 0.1 % Apply 1 application topically 2 (two) times daily. For 7-10 days maximum (Patient taking differently: Apply 1 application  topically daily as needed (Skin). For 7-10 days maximum) 80 g 0   No current facility-administered medications on file prior to visit.   Allergies  Allergen  Reactions   Clindamycin/Lincomycin     Developed C. difficile colitis 1 month after use   Family History  Problem Relation Age of Onset   Heart attack Mother        mid 55s   Heart disease Father        H/O CAD, CABG, VALVE SURGERY   Hypertension Brother    Obesity Brother    Stomach cancer Maternal Aunt    Stomach cancer Paternal Grandmother        ? colon    Colon cancer Neg Hx     PE: There were no vitals taken for this visit.   Wt Readings from Last 3 Encounters:  04/25/22 222 lb 12.8 oz (101.1 kg)  03/03/22 222 lb 6.4 oz (100.9 kg)  01/27/22 225 lb (102.1 kg)   Constitutional: overweight, in NAD Eyes: EOMI, no exophthalmos ENT: moist mucous membranes, no  thyromegaly, no cervical lymphadenopathy Cardiovascular: RRR, No MRG, + B periankle edema L>R Respiratory: CTA B Musculoskeletal: no deformities Skin: moist, warm, + stasis dermatitis rash BLE Neurological: no tremor with outstretched hands  ASSESSMENT: 1. DM2, non-insulin-dependent, previously uncontrolled, with complications - CAD, s/p MI 10/2006 - s/p stent - sees [name]  2. Obesity class 3 BMI Classification: < 18.5 underweight  18.5-24.9 normal weight  25.0-29.9 overweight  30.0-34.9 class I obesity  35.0-39.9 class II obesity  ? 40.0 class III obesity   3. HL  4.  Hypothyroidism  PLAN:  1. Patient with longstanding, previously uncontrolled, type 2 diabetes, on oral antidiabetic regimen with metformin, DPP 4 inhibitor, and SGLT2 inhibitor.  He decreased the dose of metformin after losing weight following his C. difficile infection in 2020.  We did not increase it back afterwards.  He was taken off sulfonylurea at discharge.  At last visit, sugars were mostly at goal with only very mild hyperglycemic exceptions.  We discussed about possibly stopping Januvia.  HbA1c at that time was 6.2%, lower. -At today's visit, sugars remain at goal, very well controlled.  Did not stop Januvia and would like to continue with it for now, since sugars are so well controlled.  Therefore, we will continue all 3 of his medicines for now. - I advised him to Patient Instructions  Please continue:  - Metformin 500 mg 2x a day - Januvia 100 mg before breakfast - Jardiance 10 mg before breakfast   Please return in 6 months with your sugar log.   - we checked his HbA1c: 6.0% (excellent) - advised to check sugars at different times of the day - 1x a day, rotating check times - advised for yearly eye exams >> he is UTD - return to clinic in 6 months  2. HL -Reviewed latest lipid panel from 02/2022: At goal: Lab Results  Component Value Date   CHOL 98 03/03/2022   HDL 67.20 03/03/2022    LDLCALC 6 03/03/2022   LDLDIRECT 18.0 06/13/2017   TRIG 121.0 03/03/2022   CHOLHDL 1 03/03/2022  -He continues on Lipitor 20 mg daily and omega-3 fatty acids, without side effects  3.  Hypothyroidism - latest thyroid labs reviewed with pt. >> normal: Lab Results  Component Value Date   TSH 1.52 03/03/2022  - he continues on LT4 50 mcg daily - pt feels good on this dose. - we discussed about taking the thyroid hormone every day, with water, >30 minutes before breakfast, separated by >4 hours from acid reflux medications, calcium, iron, multivitamins. Pt. is taking it correctly.  Philemon Kingdom, MD PhD [facility] Endocrinology

## 2022-05-26 NOTE — Patient Instructions (Signed)
Please continue:  - Metformin 500 mg 2x a day - Januvia 100 mg before breakfast - Jardiance 10 mg before breakfast   Please return in 6 months with your sugar log.

## 2022-06-09 ENCOUNTER — Ambulatory Visit (INDEPENDENT_AMBULATORY_CARE_PROVIDER_SITE_OTHER): Payer: Medicare Other | Admitting: Ophthalmology

## 2022-06-09 ENCOUNTER — Encounter (INDEPENDENT_AMBULATORY_CARE_PROVIDER_SITE_OTHER): Payer: Self-pay | Admitting: Ophthalmology

## 2022-06-09 ENCOUNTER — Encounter (INDEPENDENT_AMBULATORY_CARE_PROVIDER_SITE_OTHER): Payer: Medicare Other | Admitting: Ophthalmology

## 2022-06-09 DIAGNOSIS — H26492 Other secondary cataract, left eye: Secondary | ICD-10-CM | POA: Diagnosis not present

## 2022-06-09 DIAGNOSIS — E113393 Type 2 diabetes mellitus with moderate nonproliferative diabetic retinopathy without macular edema, bilateral: Secondary | ICD-10-CM

## 2022-06-09 DIAGNOSIS — H43813 Vitreous degeneration, bilateral: Secondary | ICD-10-CM | POA: Diagnosis not present

## 2022-06-09 NOTE — Assessment & Plan Note (Signed)
Physiologic OU

## 2022-06-09 NOTE — Progress Notes (Signed)
06/09/2022     CHIEF COMPLAINT Patient presents for  Chief Complaint  Patient presents with   Diabetic Retinopathy without Macular Edema      HISTORY OF PRESENT ILLNESS: [name] is a 76 y.o. male who presents to the clinic today for:   HPI   11 MOS FU OU OCT FP. Pt stated vision has remained stable since last visit. 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 Pts blood sugar was 96 this morning.  Last edited by Silvestre Moment on 06/09/2022  3:41 PM.      Referring physician: Rutherford Guys, [place],  [place] [address]  HISTORICAL INFORMATION:   Selected notes from the MEDICAL RECORD NUMBER    Lab Results  Component Value Date   HGBA1C 6.0 (A) 05/26/2022     CURRENT MEDICATIONS: No current outpatient medications on file. (Ophthalmic Drugs)   No current facility-administered medications for this visit. (Ophthalmic Drugs)   Current Outpatient Medications (Other)  Medication Sig   acetaminophen (TYLENOL) 500 MG tablet Take 1,000 mg by mouth every 6 (six) hours as needed for mild pain or moderate pain.   apixaban (ELIQUIS) 5 MG TABS tablet Take 1 tablet (5 mg total) by mouth 2 (two) times daily.   APRISO 0.375 g 24 hr capsule TAKE 4 CAPSULES (1.5 GRAMS TOTAL) BY MOUTH DAILY   atorvastatin (LIPITOR) 20 MG tablet TAKE ONE TABLET (20MG TOTAL) BY MOUTH DAILY   carvedilol (COREG) 25 MG tablet Take 1 tablet (25 mg total) by mouth 2 (two) times daily. Please call [phone] to schedule an appointment for future refills. Thank you.   clopidogrel (PLAVIX) 75 MG tablet TAKE ONE TABLET (75MG TOTAL) BY MOUTH DAILY   Clotrimazole 1 % OINT Apply to penis twice daily after saline bath/rinse and pat dry (Patient taking differently: Apply 1 application  topically daily as needed (Apply to penis  after saline bath/rinse and pat dry).)   Coenzyme Q10 (COQ10 PO) Take 1 capsule by mouth daily with supper.   Continuous Blood Gluc Sensor (FREESTYLE LIBRE 3 SENSOR) MISC  Apply every 14 days and use with application on phone   cyanocobalamin 1000 MCG tablet Take 1,000 mcg by mouth daily.   dicyclomine (BENTYL) 10 MG capsule TAKE ONE CAPSULE (10MG TOTAL) BY MOUTH TWO TIMES DAILY AS NEEDED FOR DIARRHEA OR ABDOMINAL CRAMPING   fish oil-omega-3 fatty acids 1000 MG capsule Take 1 g by mouth daily with supper.   furosemide (LASIX) 40 MG tablet Take 1 tablet (40 mg total) by mouth 2 (two) times daily.   glucose blood test strip Use as instructed to test 4 time daily   isosorbide mononitrate (IMDUR) 60 MG 24 hr tablet TAKE ONE (1) TABLET BY MOUTH EVERY DAY   JARDIANCE 10 MG TABS tablet TAKE ONE (1) TABLET BY MOUTH EVERY DAY   metFORMIN (GLUCOPHAGE) 500 MG tablet TAKE ONE TABLET (500MG TOTAL) BY MOUTH TWO TIMES DAILY WITH A MEAL   mupirocin ointment (BACTROBAN) 2 % Apply 1 application. topically 2 (two) times daily.   saccharomyces boulardii (FLORASTOR) 250 MG capsule Take 1 capsule (250 mg total) by mouth 2 (two) times daily.   sacubitril-valsartan (ENTRESTO) 97-103 MG Take 1 tablet by mouth 2 (two) times daily.   sitaGLIPtin (JANUVIA) 100 MG tablet TAKE ONE (1) TABLET BY MOUTH EVERY DAY   spironolactone (ALDACTONE) 25 MG tablet TAKE ONE (1) TABLET BY MOUTH EVERY DAY   SYNTHROID 50 MCG tablet TAKE ONE TABLET BY MOUTH  DAILY BEFORE BREAKFAST   triamcinolone (KENALOG) 0.1 % Apply 1 application topically 2 (two) times daily. For 7-10 days maximum (Patient taking differently: Apply 1 application  topically daily as needed (Skin). For 7-10 days maximum)   No current facility-administered medications for this visit. (Other)      REVIEW OF SYSTEMS: ROS   Negative for: Constitutional, Gastrointestinal, Neurological, Skin, Genitourinary, Musculoskeletal, HENT, Endocrine, Cardiovascular, Eyes, Respiratory, Psychiatric, Allergic/Imm, Heme/Lymph Last edited by Silvestre Moment on 06/09/2022  3:40 PM.       ALLERGIES Allergies  Allergen Reactions   Clindamycin/Lincomycin      Developed C. difficile colitis 1 month after use    PAST MEDICAL HISTORY Past Medical History:  Diagnosis Date   B12 deficiency    per patient previously taking shots   C. difficile colitis 04/17/2019   C. difficile diarrhea    CAD (coronary artery disease), native coronary artery 04/12/2007   Chronic atrial fibrillation ([place])    initial diagnoses 2012   Chronic combined systolic and diastolic CHF (congestive heart failure) ([place]) 04/15/2019   CKD (chronic kidney disease), stage III ([place]) 04/15/2019   CORONARY ARTERY DISEASE 04/12/2007   2 stents last in 2006.    Cystoid macular edema of right eye 01/16/2020   Cystoid macular edema of right eye 01/16/2020   DIABETES MELLITUS, TYPE II 04/16/2007   Diverticulosis of colon (without mention of hemorrhage)    GERD (gastroesophageal reflux disease) 08/09/2011   Hyperlipidemia 04/16/2007   10/24 reveal study end. Atorvastatin 72m   HYPERTENSION 04/12/2007   HYPOTHYROIDISM 04/12/2007   Ischemic cardiomyopathy    Morbid obesity ([facility] 08/09/2011   MYOCARDIAL INFARCTION, HX OF 04/12/2007   Obstructive sleep apnea 04/12/2007   02/2011 - AHI 96/h CPAP 13, Lg FF      Stroke (HCC)    Type II diabetes mellitus with peripheral circulatory disorder ([facility] 04/16/2007   Dr. GCruzita Lederermanages. Uses fructosamine.     ULCERATIVE COLITIS, LEFT SIDED 11/23/2010   Ulcerative pancolitis without complication ([facility] 133/29/5188  Ulcerative Colitis. Mesalamine.    VITAMIN B12 DEFICIENCY 11/29/2010   Past Surgical History:  Procedure Laterality Date   CARDIAC CATHETERIZATION  10/2002   STENT. 2 stents Dr. BMaurene Capes  CARDIAC CATHETERIZATION N/A 10/28/2015   Procedure: Right/Left Heart Cath and Coronary Angiography;  Surgeon: MSherren Mocha MD;  [facility];  Service: Cardiovascular;  Laterality: N/A;   CATARACT EXTRACTION     2021 bilateral eyes    COLONOSCOPY  2012   Dr. PSharlett Iles findings consistent with universal UC s/p random biopsies, no active bleeding,  polyps, masses.  Pathology with mild chronic active colitis.  Repeat in 5 years.   CORONARY STENT PLACEMENT  2008   LAD    I & D EXTREMITY Right 01/27/2022   Procedure: RIGHT SECOND METARTSOPHALANGEAL JOINT DISARTICULATION, IRRIGATION AND DEBRIDEMENT OF ABSCESS RIGHT FOOT;  Surgeon: AErle Crocker MD;  [facility]  Service: Orthopedics;  Laterality: Right;   RIGHT/LEFT HEART CATH AND CORONARY ANGIOGRAPHY N/A 06/01/2018   Procedure: RIGHT/LEFT HEART CATH AND CORONARY ANGIOGRAPHY;  Surgeon: CSherren Mocha MD;  [facility];  Service: Cardiovascular;  Laterality: N/A;   TONSILLECTOMY      FAMILY HISTORY Family History  Problem Relation Age of Onset   Heart attack Mother        mid 745s  Heart disease Father        H/O CAD, CABG, VALVE SURGERY   Hypertension Brother  Obesity Brother    Stomach cancer Maternal Aunt    Stomach cancer Paternal Grandmother        ? colon    Colon cancer Neg Hx     SOCIAL HISTORY Social History   Tobacco Use   Smoking status: Former    Packs/day: 1.00    Years: 5.00    Total pack years: 5.00    Types: Cigarettes    Quit date: 04/13/1966    Years since quitting: 56.1   Smokeless tobacco: Never  Vaping Use   Vaping Use: Never used  Substance Use Topics   Alcohol use: No    Alcohol/week: 0.0 standard drinks of alcohol    Comment: no more beer   Drug use: No         OPHTHALMIC EXAM:  Base Eye Exam     Visual Acuity (ETDRS)       Right Left   Dist [place] 20/30 -1 20/25 +2   Dist ph [place] 20/20 -2          Tonometry (Tonopen, 3:46 PM)       Right Left   Pressure 13 10         Pupils       Pupils Dark Light Shape React APD   Right PERRL 4 3 Round Brisk None   Left PERRL 4 3 Round Brisk None         Visual Fields       Left Right    Full Full         Extraocular Movement       Right Left    Full, Ortho Full, Ortho         Neuro/Psych     Oriented x3: Yes   Mood/Affect: Normal          Dilation     Both eyes: 1.0% Mydriacyl, 2.5% Phenylephrine @ 3:46 PM           Slit Lamp and Fundus Exam     External Exam       Right Left   External Normal Normal         Slit Lamp Exam       Right Left   Lids/Lashes Normal Normal   Conjunctiva/Sclera White and quiet White and quiet   Cornea Clear Clear   Anterior Chamber Deep and quiet Deep and quiet   Iris Round and reactive Round and reactive   Lens Posterior chamber intraocular lens, 1+ Posterior capsular opacification Posterior chamber intraocular lens, 2+ Posterior capsular opacification   Anterior Vitreous Normal Normal         Fundus Exam       Right Left   Posterior Vitreous Normal Normal   Disc Normal Normal   C/D Ratio 0.1 0.1   Macula Normal Normal   Vessels Retinopathy moderate NPDR Retinopathy moderate NPDR   Periphery Normal Normal            IMAGING AND PROCEDURES  Imaging and Procedures for 06/09/22  OCT, Retina - OU - Both Eyes       Right Eye Quality was good. Scan locations included subfoveal. Central Foveal Thickness: 298. Progression has been stable. Findings include normal foveal contour.   Left Eye Quality was good. Scan locations included subfoveal. Central Foveal Thickness: 293. Progression has been stable. Findings include normal foveal contour.   Notes No active CSME OU, will observe     Color Fundus Photography Optos - OU - Both Eyes  Right Eye Progression has been stable. Disc findings include normal observations. Macula : microaneurysms.   Left Eye Progression has been stable. Macula : microaneurysms.   Notes Moderate NPDR OU with clear media.  PVD incidental             ASSESSMENT/PLAN:  Moderate nonproliferative diabetic retinopathy of both eyes (HCC) Stable OU with no interval change,  Posterior capsular opacification, left Mild PC opacity follow-up Dr. Rutherford Guys as scheduled  Posterior vitreous detachment of both  eyes Physiologic OU     ICD-10-CM   1. Moderate nonproliferative diabetic retinopathy of both eyes without macular edema associated with type 2 diabetes mellitus (HCC)  R41.6384 OCT, Retina - OU - Both Eyes    Color Fundus Photography Optos - OU - Both Eyes    2. Posterior capsular opacification, left  H26.492     3. Posterior vitreous detachment of both eyes  H43.813       1.  Moderate NPDR with no signs of progression.  No signs of maculopathy.  Excellent a hemoglobin A1c at 6.0 under the care of Dr. Orlene Och  2.  Early PC opacity left eye follow-up Dr. Rutherford Guys scheduled may need YAG capsulotomy  3.  Ophthalmic Meds Ordered this visit:  No orders of the defined types were placed in this encounter.      Return in about 9 months (around 03/10/2023) for DILATE OU, COLOR FP, OCT.  There are no Patient Instructions on file for this visit.   Explained the diagnoses, plan, and follow up with the patient and they expressed understanding.  Patient expressed understanding of the importance of proper follow up care.   [name] M.D. Diseases & Surgery of the Retina and Vitreous Retina & Diabetic [place] 06/09/22     Abbreviations: M myopia (nearsighted); A astigmatism; H hyperopia (farsighted); P presbyopia; Mrx spectacle prescription;  CTL contact lenses; OD right eye; OS left eye; OU both eyes  XT exotropia; ET esotropia; PEK punctate epithelial keratitis; PEE punctate epithelial erosions; DES dry eye syndrome; MGD meibomian gland dysfunction; ATs artificial tears; PFAT's preservative free artificial tears; [place] nuclear sclerotic cataract; PSC posterior subcapsular cataract; ERM epi-retinal membrane; PVD posterior vitreous detachment; RD retinal detachment; DM diabetes mellitus; DR diabetic retinopathy; NPDR non-proliferative diabetic retinopathy; PDR proliferative diabetic retinopathy; CSME clinically significant macular edema; DME diabetic macular edema; dbh dot blot  hemorrhages; CWS cotton wool spot; POAG primary open angle glaucoma; C/D cup-to-disc ratio; HVF humphrey visual field; GVF goldmann visual field; OCT optical coherence tomography; IOP intraocular pressure; BRVO Branch retinal vein occlusion; CRVO central retinal vein occlusion; CRAO central retinal artery occlusion; BRAO branch retinal artery occlusion; RT retinal tear; SB scleral buckle; PPV pars plana vitrectomy; VH Vitreous hemorrhage; PRP panretinal laser photocoagulation; IVK intravitreal kenalog; VMT vitreomacular traction; MH Macular hole;  NVD neovascularization of the disc; NVE neovascularization elsewhere; AREDS age related eye disease study; ARMD age related macular degeneration; POAG primary open angle glaucoma; EBMD epithelial/anterior basement membrane dystrophy; ACIOL anterior chamber intraocular lens; IOL intraocular lens; PCIOL posterior chamber intraocular lens; Phaco/IOL phacoemulsification with intraocular lens placement; [place] photorefractive keratectomy; LASIK laser assisted in situ keratomileusis; HTN hypertension; DM diabetes mellitus; COPD chronic obstructive pulmonary disease

## 2022-06-09 NOTE — Assessment & Plan Note (Signed)
Stable OU with no interval change,

## 2022-06-09 NOTE — Assessment & Plan Note (Signed)
Mild PC opacity follow-up Dr. Rutherford Guys as scheduled

## 2022-06-26 IMAGING — CT CT HEAD W/O CM
4 series · 17 of 47 positions shown, 19 images · non-contrast
Comparison: 10/28/2015

CLINICAL DATA: Altered mental status.

EXAM:
CT HEAD WITHOUT CONTRAST
TECHNIQUE: Contiguous axial images were obtained from the base of the skull
through the vertex without intravenous contrast.

[Series 3: head wo · axial · 0.56mm/px · z∈[-196,-66]mm · 7 of 36 slices shown, 9 images]
[im 5/36  brain]
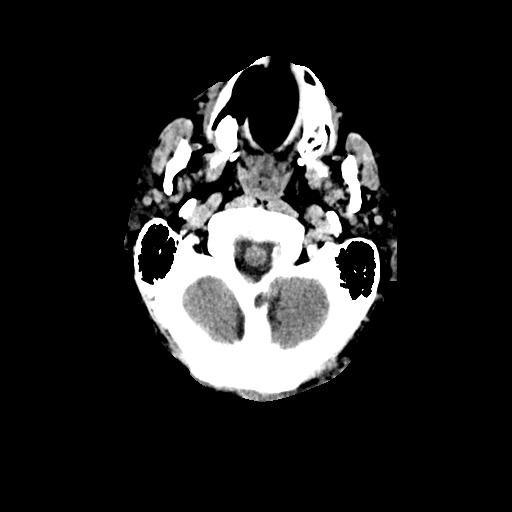
[im 5/36  bone]
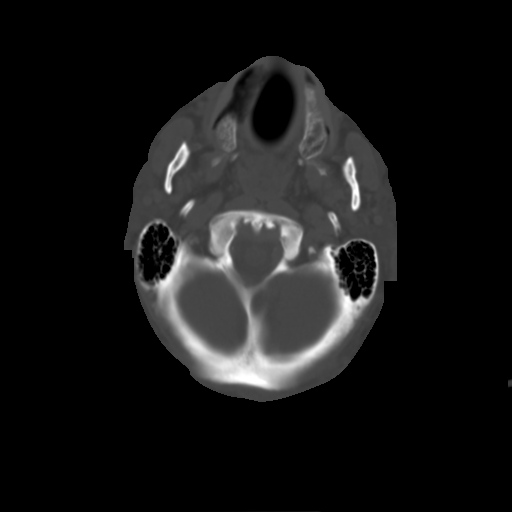
[im 9/36  brain]
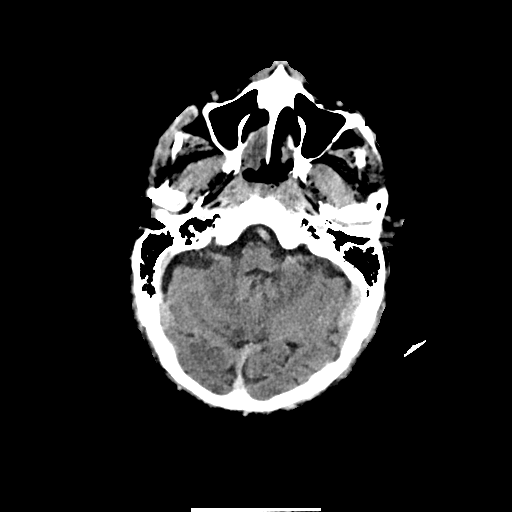
[im 14/36  brain]
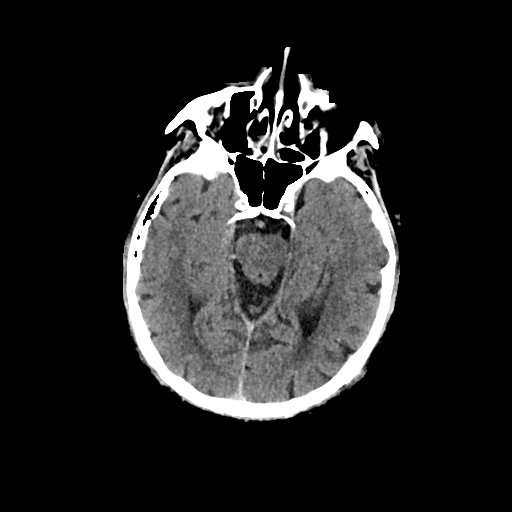
[im 18/36  brain]
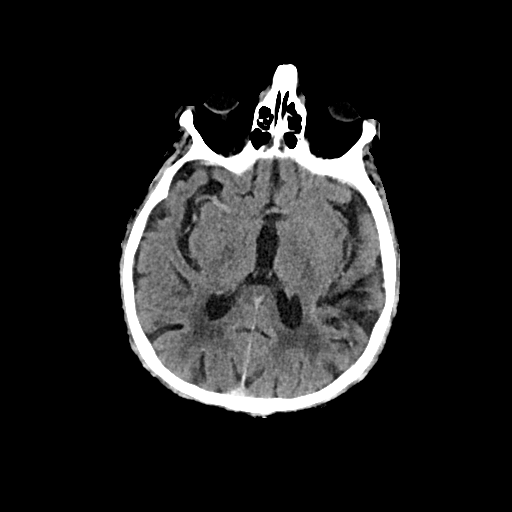
[im 22/36  brain]
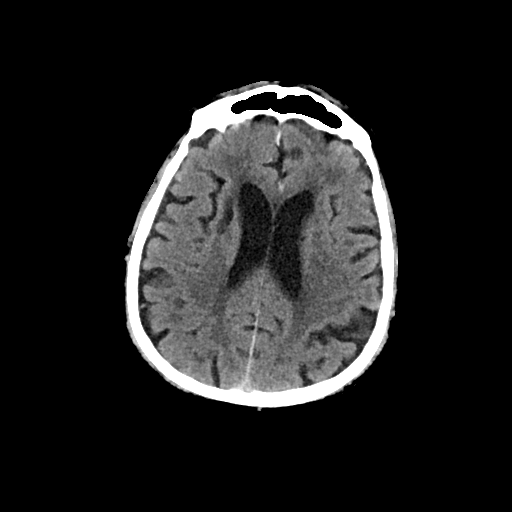
[im 22/36  bone]
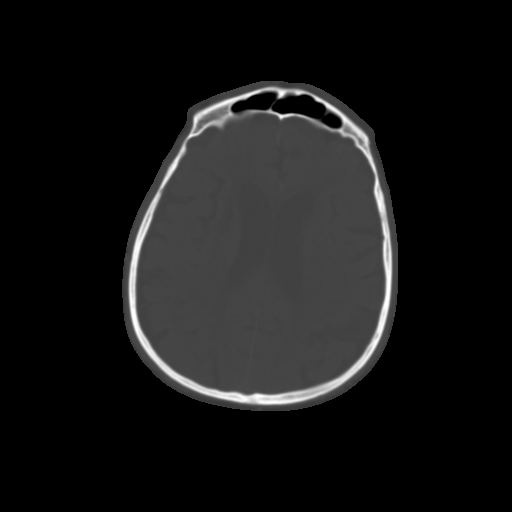
[im 27/36  brain]
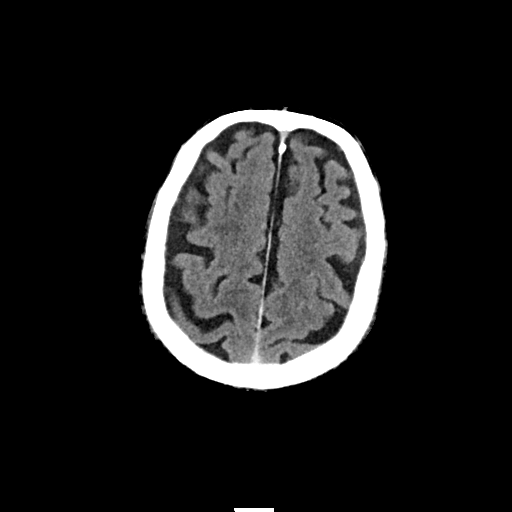
[im 31/36  brain]
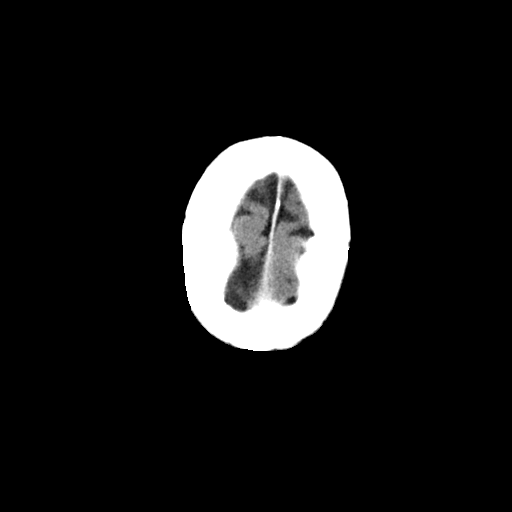

[Series 4: head bone · axial · 0.56mm/px · z∈[-200,-138]mm · 4 of 88 slices shown]
[im 9/88  bone]
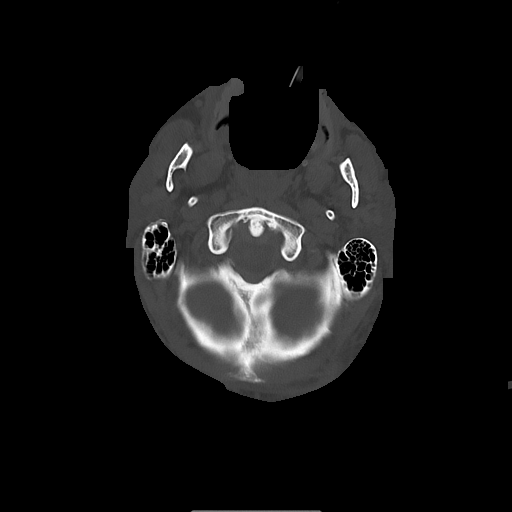
[im 18/88  bone]
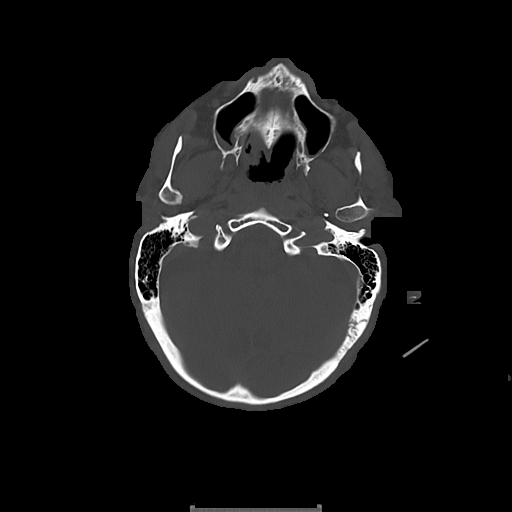
[im 27/88  bone]
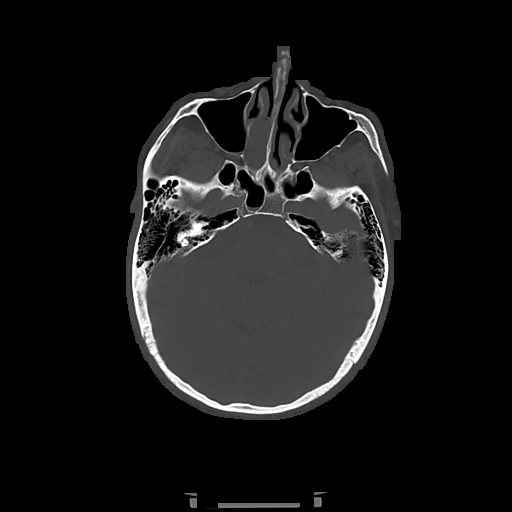
[im 40/88  bone]
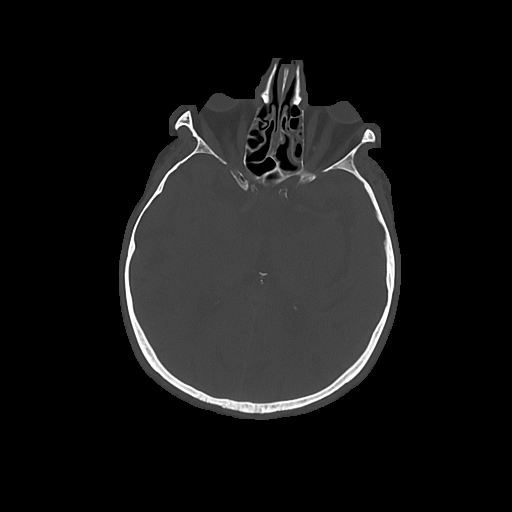

[Series 5: cor soft · coronal · 0.39mm/px · 3 of 86 slices shown]
[im 32/86  brain]
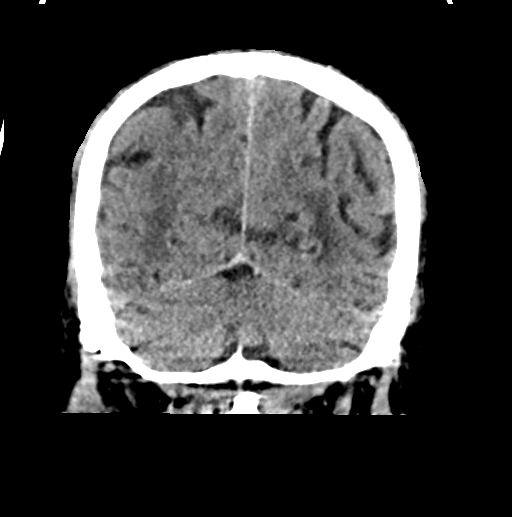
[im 40/86  brain]
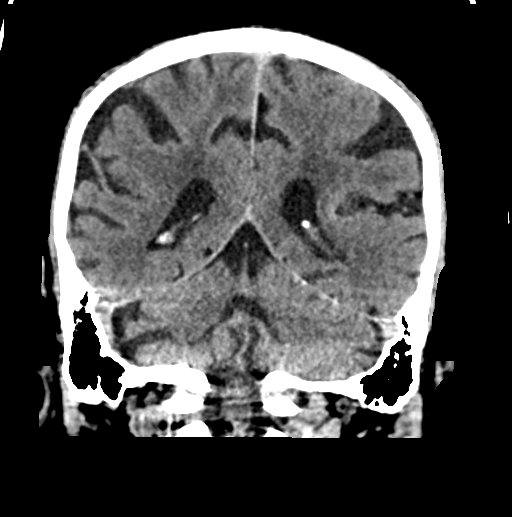
[im 48/86  brain]
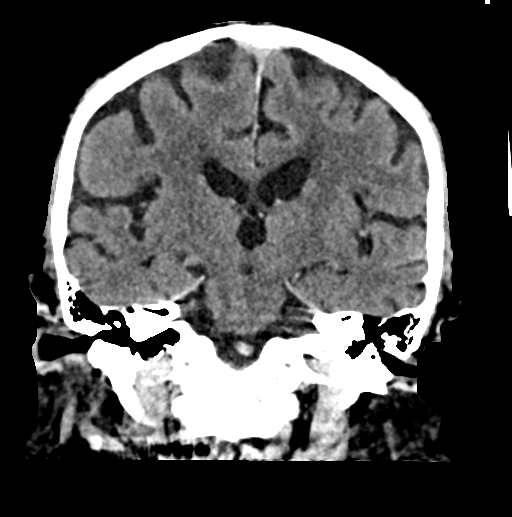

[Series 6: sag soft · sagittal · 0.39mm/px · 3 of 66 slices shown]
[im 22/66  brain]
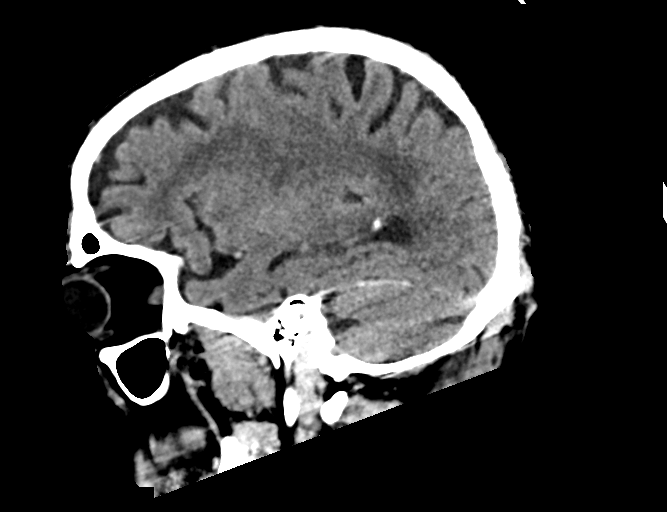
[im 33/66  brain]
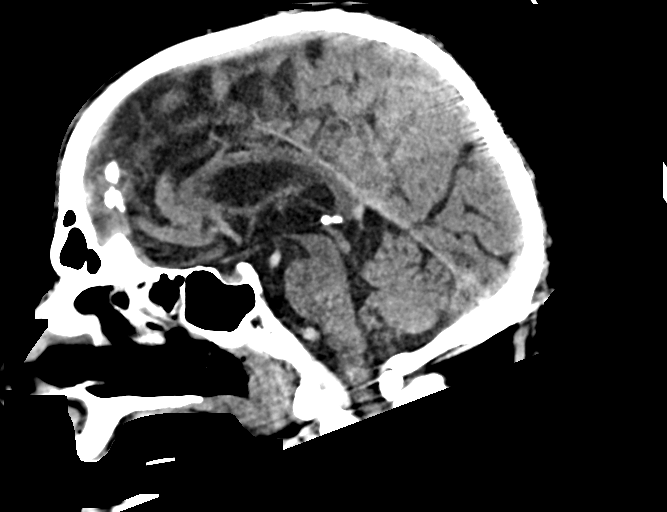
[im 44/66  brain]
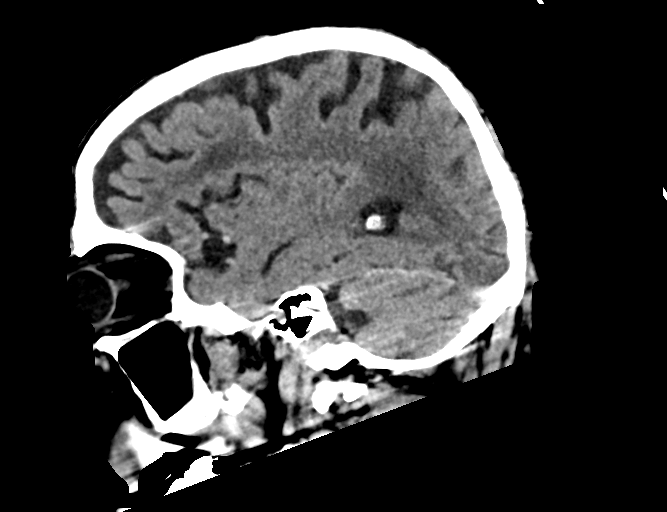

[17 of 47 positions shown; findings below may reference images not displayed]

FINDINGS: Brain: There is atrophy and chronic small vessel disease changes.
Old right basal ganglia lacunar infarct, stable. No acute
intracranial abnormality. Specifically, no hemorrhage,
hydrocephalus, mass lesion, acute infarction, or significant
intracranial injury.

Vascular: No hyperdense vessel or unexpected calcification.

Skull: No acute calvarial abnormality.

Sinuses/Orbits: No acute findings

Other: None
IMPRESSION: Atrophy, chronic microvascular disease.

No acute intracranial abnormality.

## 2022-07-04 ENCOUNTER — Encounter: Payer: Self-pay | Admitting: *Deleted

## 2022-07-06 ENCOUNTER — Ambulatory Visit: Payer: Medicare Other | Admitting: Gastroenterology

## 2022-07-08 ENCOUNTER — Other Ambulatory Visit: Payer: Self-pay | Admitting: Internal Medicine

## 2022-07-08 DIAGNOSIS — H9313 Tinnitus, bilateral: Secondary | ICD-10-CM | POA: Diagnosis not present

## 2022-07-08 DIAGNOSIS — H838X3 Other specified diseases of inner ear, bilateral: Secondary | ICD-10-CM | POA: Diagnosis not present

## 2022-07-08 DIAGNOSIS — H903 Sensorineural hearing loss, bilateral: Secondary | ICD-10-CM | POA: Diagnosis not present

## 2022-07-12 NOTE — Progress Notes (Unsigned)
GI Office Note    Referring Provider: Marin Olp, MD Primary Care Physician:  Marin Olp, MD  Primary Gastroenterologist: Garfield Cornea, MD   Chief Complaint   No chief complaint on file.   History of Present Illness   [name] is a 76 y.o. male presenting today for follow-up.  Patient was last seen February 2023.  He has a history of ulcerative pancolitis, C. difficile in July 2020, postinfectious IBS thereafter.  Last colonoscopy 2012 with Dr. Sharlett Iles with findings consistent with universal UC status postrenal biopsies, no active bleeding, polyps, masses, stated colitis was in remission on aminosalicylate's.  Pathology with mild chronic active colitis.  Recommended repeat colonoscopy in 5 years.  Patient has declined colonoscopy in the past.  Has been maintained on Apriso 1.5 g daily.  So maintains lactose-free diet.  Patient with history of thrombocytopenia dating back to March 2022, chronically on Plavix.  No known liver disease that no imaging on file.  Patient declined ultrasound at time of last visit.        Medications   Current Outpatient Medications  Medication Sig Dispense Refill   acetaminophen (TYLENOL) 500 MG tablet Take 1,000 mg by mouth every 6 (six) hours as needed for mild pain or moderate pain.     apixaban (ELIQUIS) 5 MG TABS tablet Take 1 tablet (5 mg total) by mouth 2 (two) times daily. 180 tablet 3   APRISO 0.375 g 24 hr capsule TAKE 4 CAPSULES (1.5 GRAMS TOTAL) BY MOUTH DAILY 120 capsule 5   atorvastatin (LIPITOR) 20 MG tablet TAKE ONE TABLET (20MG TOTAL) BY MOUTH DAILY 30 tablet 5   carvedilol (COREG) 25 MG tablet Take 1 tablet (25 mg total) by mouth 2 (two) times daily. Please call [phone] to schedule an appointment for future refills. Thank you. 180 tablet 0   clopidogrel (PLAVIX) 75 MG tablet TAKE ONE TABLET (75MG TOTAL) BY MOUTH DAILY 90 tablet 3   Clotrimazole 1 % OINT Apply to penis twice daily after saline  bath/rinse and pat dry (Patient taking differently: Apply 1 application  topically daily as needed (Apply to penis  after saline bath/rinse and pat dry).) 56.7 g 0   Coenzyme Q10 (COQ10 PO) Take 1 capsule by mouth daily with supper.     Continuous Blood Gluc Sensor (FREESTYLE LIBRE 3 SENSOR) MISC Apply every 14 days and use with application on phone 6 each 3   cyanocobalamin 1000 MCG tablet Take 1,000 mcg by mouth daily.     dicyclomine (BENTYL) 10 MG capsule TAKE ONE CAPSULE (10MG TOTAL) BY MOUTH TWO TIMES DAILY AS NEEDED FOR DIARRHEA OR ABDOMINAL CRAMPING 90 capsule 1   fish oil-omega-3 fatty acids 1000 MG capsule Take 1 g by mouth daily with supper.     furosemide (LASIX) 40 MG tablet Take 1 tablet (40 mg total) by mouth 2 (two) times daily. 180 tablet 3   glucose blood test strip Use as instructed to test 4 time daily 400 each 3   isosorbide mononitrate (IMDUR) 60 MG 24 hr tablet TAKE ONE (1) TABLET BY MOUTH EVERY DAY 90 tablet 1   JARDIANCE 10 MG TABS tablet TAKE ONE (1) TABLET BY MOUTH EVERY DAY 90 tablet 3   metFORMIN (GLUCOPHAGE) 500 MG tablet TAKE ONE TABLET (500MG TOTAL) BY MOUTH TWO TIMES DAILY WITH A MEAL 180 tablet 3   mupirocin ointment (BACTROBAN) 2 % Apply 1 application. topically 2 (two) times daily. 22 g 1  saccharomyces boulardii (FLORASTOR) 250 MG capsule Take 1 capsule (250 mg total) by mouth 2 (two) times daily.     sacubitril-valsartan (ENTRESTO) 97-103 MG Take 1 tablet by mouth 2 (two) times daily. 180 tablet 3   sitaGLIPtin (JANUVIA) 100 MG tablet TAKE ONE (1) TABLET BY MOUTH EVERY DAY 90 tablet 3   spironolactone (ALDACTONE) 25 MG tablet TAKE ONE (1) TABLET BY MOUTH EVERY DAY 90 tablet 3   SYNTHROID 50 MCG tablet TAKE ONE TABLET BY MOUTH DAILY BEFORE BREAKFAST 90 tablet 3   triamcinolone (KENALOG) 0.1 % Apply 1 application topically 2 (two) times daily. For 7-10 days maximum (Patient taking differently: Apply 1 application  topically daily as needed (Skin). For 7-10 days  maximum) 80 g 0   No current facility-administered medications for this visit.    Allergies   Allergies as of 07/13/2022 - Review Complete 06/09/2022  Allergen Reaction Noted   Clindamycin/lincomycin  04/26/2019     Past Medical History   Past Medical History:  Diagnosis Date   B12 deficiency    per patient previously taking shots   C. difficile colitis 04/17/2019   C. difficile diarrhea    CAD (coronary artery disease), native coronary artery 04/12/2007   Chronic atrial fibrillation ([place])    initial diagnoses 2012   Chronic combined systolic and diastolic CHF (congestive heart failure) ([place]) 04/15/2019   CKD (chronic kidney disease), stage III ([place]) 04/15/2019   CORONARY ARTERY DISEASE 04/12/2007   2 stents last in 2006.    Cystoid macular edema of right eye 01/16/2020   Cystoid macular edema of right eye 01/16/2020   DIABETES MELLITUS, TYPE II 04/16/2007   Diverticulosis of colon (without mention of hemorrhage)    GERD (gastroesophageal reflux disease) 08/09/2011   Hyperlipidemia 04/16/2007   10/24 reveal study end. Atorvastatin 63m   HYPERTENSION 04/12/2007   HYPOTHYROIDISM 04/12/2007   Ischemic cardiomyopathy    Morbid obesity ([facility] 08/09/2011   MYOCARDIAL INFARCTION, HX OF 04/12/2007   Obstructive sleep apnea 04/12/2007   02/2011 - AHI 96/h CPAP 13, Lg FF      Stroke (HCC)    Type II diabetes mellitus with peripheral circulatory disorder ([facility] 04/16/2007   Dr. GCruzita Lederermanages. Uses fructosamine.     ULCERATIVE COLITIS, LEFT SIDED 11/23/2010   Ulcerative pancolitis without complication ([facility] 190/24/0973  Ulcerative Colitis. Mesalamine.    VITAMIN B12 DEFICIENCY 11/29/2010    Past Surgical History   Past Surgical History:  Procedure Laterality Date   CARDIAC CATHETERIZATION  10/2002   STENT. 2 stents Dr. BMaurene Capes  CARDIAC CATHETERIZATION N/A 10/28/2015   Procedure: Right/Left Heart Cath and Coronary Angiography;  Surgeon: MSherren Mocha MD;  [facility];  Service:  Cardiovascular;  Laterality: N/A;   CATARACT EXTRACTION     2021 bilateral eyes    COLONOSCOPY  2012   Dr. PSharlett Iles findings consistent with universal UC s/p random biopsies, no active bleeding, polyps, masses.  Pathology with mild chronic active colitis.  Repeat in 5 years.   CORONARY STENT PLACEMENT  2008   LAD    I & D EXTREMITY Right 01/27/2022   Procedure: RIGHT SECOND METARTSOPHALANGEAL JOINT DISARTICULATION, IRRIGATION AND DEBRIDEMENT OF ABSCESS RIGHT FOOT;  Surgeon: AErle Crocker MD;  [facility]  Service: Orthopedics;  Laterality: Right;   RIGHT/LEFT HEART CATH AND CORONARY ANGIOGRAPHY N/A 06/01/2018   Procedure: RIGHT/LEFT HEART CATH AND CORONARY ANGIOGRAPHY;  Surgeon: CSherren Mocha MD;  [facility];  Service:  Cardiovascular;  Laterality: N/A;   TONSILLECTOMY      Past Family History   Family History  Problem Relation Age of Onset   Heart attack Mother        mid 62s   Heart disease Father        H/O CAD, CABG, VALVE SURGERY   Hypertension Brother    Obesity Brother    Stomach cancer Maternal Aunt    Stomach cancer Paternal Grandmother        ? colon    Colon cancer Neg Hx     Past Social History   Social History   Socioeconomic History   Marital status: Married    Spouse name: Not on file   Number of children: 1   Years of education: Not on file   Highest education level: Not on file  Occupational History   Occupation: RETIRED    Employer: RETIRED  Tobacco Use   Smoking status: Former    Packs/day: 1.00    Years: 5.00    Total pack years: 5.00    Types: Cigarettes    Quit date: 04/13/1966    Years since quitting: 56.2   Smokeless tobacco: Never  Vaping Use   Vaping Use: Never used  Substance and Sexual Activity   Alcohol use: No    Alcohol/week: 0.0 standard drinks of alcohol    Comment: no more beer   Drug use: No   Sexual activity: Not on file  Other Topics Concern   Not on file  Social History Narrative    Cardiorehab 3 days a week. 45 minutes to an hour-stationary bike.    GRANDDAUGHTER (MS. 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       Married for 37 years in 2015, married previously for 14 years. Daughter with first wife and 3 grandkids.    Lives alone with wife. Get to see grandkids a lot. New grandchild in middle of 59      Hobbies-yardwork, previously liked to hunt and fish, does some target shooting      Tourist information centre manager for 6 yrs   Social Determinants of Health   Financial Resource Strain: Low Risk  (11/12/2021)   Overall Financial Resource Strain (CARDIA)    Difficulty of Paying Living Expenses: Not hard at all  Food Insecurity: No Food Insecurity (11/12/2021)   Hunger Vital Sign    Worried About Running Out of Food in the Last Year: Never true    [place] in the Last Year: Never true  Transportation Needs: No Transportation Needs (11/12/2021)   PRAPARE - Hydrologist (Medical): No    Lack of Transportation (Non-Medical): No  Physical Activity: Inactive (11/12/2021)   Exercise Vital Sign    Days of Exercise per Week: 0 days    Minutes of Exercise per Session: 0 min  Stress: No Stress Concern Present (11/12/2021)   [place]    Feeling of Stress : Not at all  Social Connections: [place] (11/12/2021)   Social Connection and Isolation Panel [NHANES]    Frequency of Communication with Friends and Family: More than three times a week    Frequency of Social Gatherings with Friends and Family: More than three times a week    Attends Religious Services: More than 4  times per year    Active Member of Clubs or Organizations: Yes    Attends Archivist Meetings: 1 to 4 times per year    Marital Status: Married  Human resources officer Violence: Not At Risk (11/12/2021)    Humiliation, Afraid, Rape, and Kick questionnaire    Fear of Current or Ex-Partner: No    Emotionally Abused: No    Physically Abused: No    Sexually Abused: No    Review of Systems   General: Negative for anorexia, weight loss, fever, chills, fatigue, weakness. ENT: Negative for hoarseness, difficulty swallowing , nasal congestion. CV: Negative for chest pain, angina, palpitations, dyspnea on exertion, peripheral edema.  Respiratory: Negative for dyspnea at rest, dyspnea on exertion, cough, sputum, wheezing.  GI: See history of present illness. GU:  Negative for dysuria, hematuria, urinary incontinence, urinary frequency, nocturnal urination.  Endo: Negative for unusual weight change.     Physical Exam   There were no vitals taken for this visit.   General: Well-nourished, well-developed in no acute distress.  Eyes: No icterus. Mouth: Oropharyngeal mucosa moist and pink , no lesions erythema or exudate. Lungs: Clear to auscultation bilaterally.  Heart: Regular rate and rhythm, no murmurs rubs or gallops.  Abdomen: Bowel sounds are normal, nontender, nondistended, no hepatosplenomegaly or masses,  no abdominal bruits or hernia , no rebound or guarding.  Rectal: ***  Extremities: No lower extremity edema. No clubbing or deformities. Neuro: Alert and oriented x 4   Skin: Warm and dry, no jaundice.   Psych: Alert and cooperative, normal mood and affect.  Labs   Lab Results  Component Value Date   CREATININE 1.30 03/03/2022   BUN 16 03/03/2022   NA 140 03/03/2022   K 4.9 03/03/2022   CL 99 03/03/2022   CO2 34 (H) 03/03/2022   Lab Results  Component Value Date   ALT 9 03/03/2022   AST 13 03/03/2022   ALKPHOS 80 03/03/2022   BILITOT 1.0 03/03/2022   Lab Results  Component Value Date   WBC 9.7 03/03/2022   HGB 14.9 03/03/2022   HCT 44.4 03/03/2022   MCV 88.6 03/03/2022   PLT 156.0 03/03/2022   Lab Results  Component Value Date   [id number] 621 03/03/2022   Lab  Results  Component Value Date   TSH 1.52 03/03/2022    Imaging Studies   No results found.  Assessment       PLAN   ***   Laureen Ochs. Bobby Rumpf, [place], [facility]

## 2022-07-13 ENCOUNTER — Encounter: Payer: Self-pay | Admitting: Gastroenterology

## 2022-07-13 ENCOUNTER — Ambulatory Visit (INDEPENDENT_AMBULATORY_CARE_PROVIDER_SITE_OTHER): Payer: Medicare Other | Admitting: Gastroenterology

## 2022-07-13 VITALS — BP 115/63 | HR 79 | Temp 97.4°F | Ht 68.0 in | Wt 222.8 lb

## 2022-07-13 DIAGNOSIS — R197 Diarrhea, unspecified: Secondary | ICD-10-CM | POA: Diagnosis not present

## 2022-07-13 DIAGNOSIS — I2510 Atherosclerotic heart disease of native coronary artery without angina pectoris: Secondary | ICD-10-CM

## 2022-07-13 DIAGNOSIS — A0472 Enterocolitis due to Clostridium difficile, not specified as recurrent: Secondary | ICD-10-CM

## 2022-07-13 DIAGNOSIS — K5100 Ulcerative (chronic) pancolitis without complications: Secondary | ICD-10-CM

## 2022-07-13 MED ORDER — DICYCLOMINE HCL 10 MG PO CAPS: ORAL_CAPSULE | ORAL | 3 refills | Status: DC

## 2022-07-13 NOTE — Patient Instructions (Signed)
Continue Apriso 1.5 grams (4 capsules) daily.  I have refilled your dicyclomine today. Please monitor for persistent diarrhea, abdominal pain, or blood in the stool. If you develop any symptoms, please let us know.  Return to the office in one year or sooner if needed.

## 2022-07-15 ENCOUNTER — Other Ambulatory Visit: Payer: Self-pay | Admitting: Cardiovascular Disease

## 2022-08-01 ENCOUNTER — Encounter: Payer: Self-pay | Admitting: Podiatry

## 2022-08-01 ENCOUNTER — Ambulatory Visit (INDEPENDENT_AMBULATORY_CARE_PROVIDER_SITE_OTHER): Payer: Medicare Other | Admitting: Podiatry

## 2022-08-01 DIAGNOSIS — B351 Tinea unguium: Secondary | ICD-10-CM | POA: Diagnosis not present

## 2022-08-01 DIAGNOSIS — S98131S Complete traumatic amputation of one right lesser toe, sequela: Secondary | ICD-10-CM

## 2022-08-01 DIAGNOSIS — M79675 Pain in left toe(s): Secondary | ICD-10-CM

## 2022-08-01 DIAGNOSIS — D696 Thrombocytopenia, unspecified: Secondary | ICD-10-CM

## 2022-08-01 DIAGNOSIS — M79674 Pain in right toe(s): Secondary | ICD-10-CM

## 2022-08-01 NOTE — Progress Notes (Signed)
This patient returns to my office for at risk foot care.  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  Patient has amputation second toe right foot.  This patient is unable to cut nails himself since the patient cannot reach his nails.These nails are painful walking and wearing shoes.  This patient presents for at risk foot care today.  General Appearance  Alert, conversant and in no acute stress.  Vascular  Dorsalis pedis and posterior tibial  pulses are weakly  palpable  bilaterally.  Capillary return is within normal limits  bilaterally. Temperature is within normal limits  bilaterally.  Neurologic  Senn-Weinstein monofilament wire test within normal limits  bilaterally. Muscle power within normal limits bilaterally.  Nails Thick disfigured discolored nails with subungual debris  from hallux to fifth toes  left and 1,3-5 right.. No evidence of bacterial infection or drainage bilaterally.  Orthopedic  No limitations of motion  feet .  No crepitus or effusions noted.  No bony pathology or digital deformities noted.  Skin  normotropic skin with no porokeratosis noted bilaterally.  No signs of infections or ulcers noted.     Onychomycosis  Pain in right toes  Pain in left toes  Consent was obtained for treatment procedures.   Mechanical debridement of nails 1-5  bilaterally performed with a nail nipper.  Filed with dremel without incident.    Return office visit   3 months                   Told patient to return for periodic foot care and evaluation due to potential at risk complications.   Gardiner Barefoot DPM

## 2022-08-02 ENCOUNTER — Other Ambulatory Visit: Payer: Self-pay | Admitting: Cardiovascular Disease

## 2022-08-11 ENCOUNTER — Other Ambulatory Visit (HOSPITAL_COMMUNITY): Payer: Self-pay | Admitting: Internal Medicine

## 2022-08-19 ENCOUNTER — Other Ambulatory Visit (HOSPITAL_COMMUNITY): Payer: Self-pay | Admitting: Internal Medicine

## 2022-08-29 ENCOUNTER — Ambulatory Visit: Payer: Medicare Other | Admitting: Family Medicine

## 2022-08-31 DIAGNOSIS — Z23 Encounter for immunization: Secondary | ICD-10-CM | POA: Diagnosis not present

## 2022-09-27 ENCOUNTER — Ambulatory Visit (INDEPENDENT_AMBULATORY_CARE_PROVIDER_SITE_OTHER): Payer: Medicare Other | Admitting: Family Medicine

## 2022-09-27 ENCOUNTER — Encounter: Payer: Self-pay | Admitting: Family Medicine

## 2022-09-27 VITALS — BP 108/70 | HR 50 | Temp 98.1°F | Ht 68.0 in | Wt 222.2 lb

## 2022-09-27 DIAGNOSIS — E1151 Type 2 diabetes mellitus with diabetic peripheral angiopathy without gangrene: Secondary | ICD-10-CM | POA: Diagnosis not present

## 2022-09-27 DIAGNOSIS — I5022 Chronic systolic (congestive) heart failure: Secondary | ICD-10-CM

## 2022-09-27 DIAGNOSIS — D692 Other nonthrombocytopenic purpura: Secondary | ICD-10-CM | POA: Diagnosis not present

## 2022-09-27 DIAGNOSIS — I10 Essential (primary) hypertension: Secondary | ICD-10-CM | POA: Diagnosis not present

## 2022-09-27 DIAGNOSIS — I4821 Permanent atrial fibrillation: Secondary | ICD-10-CM

## 2022-09-27 DIAGNOSIS — E039 Hypothyroidism, unspecified: Secondary | ICD-10-CM

## 2022-09-27 DIAGNOSIS — I2510 Atherosclerotic heart disease of native coronary artery without angina pectoris: Secondary | ICD-10-CM | POA: Diagnosis not present

## 2022-09-27 LAB — COMPREHENSIVE METABOLIC PANEL
ALT: 8 U/L (ref 0–53)
AST: 12 U/L (ref 0–37)
Albumin: 4 g/dL (ref 3.5–5.2)
Alkaline Phosphatase: 61 U/L (ref 39–117)
BUN: 17 mg/dL (ref 6–23)
CO2: 32 mEq/L (ref 19–32)
Calcium: 8.9 mg/dL (ref 8.4–10.5)
Chloride: 102 mEq/L (ref 96–112)
Creatinine, Ser: 1.29 mg/dL (ref 0.40–1.50)
GFR: 53.79 mL/min — ABNORMAL LOW (ref >60.00)
Glucose, Bld: 120 mg/dL — ABNORMAL HIGH (ref 70–99)
Potassium: 4.6 mEq/L (ref 3.5–5.1)
Sodium: 140 mEq/L (ref 135–145)
Total Bilirubin: 1 mg/dL (ref 0.2–1.2)
Total Protein: 6.1 g/dL (ref 6.0–8.3)

## 2022-09-27 LAB — TSH: TSH: 1.02 u[IU]/mL (ref 0.35–5.50)

## 2022-09-27 LAB — MICROALBUMIN / CREATININE URINE RATIO
Creatinine,U: 72.1 mg/dL
Microalb Creat Ratio: 3.8 mg/g (ref 0.0–30.0)
Microalb, Ur: 2.8 mg/dL — ABNORMAL HIGH (ref 0.0–1.9)

## 2022-09-27 NOTE — Patient Instructions (Addendum)
Please stop by lab before you go If you have mychart- we will send your results within 3 business days of [place] receiving them.  If you do not have mychart- we will call you about results within 5 business days of [place] receiving them.  *please also note that you will see labs on mychart as soon as they post. I will later go in and write notes on them- will say "notes from Dr. Yong Channel"   Glad you are doing so well!   Recommended follow up: Return in about 6 months (around 03/29/2023) for followup or sooner if needed.Schedule b4 you leave.

## 2022-09-27 NOTE — Progress Notes (Signed)
Phone [phone] In person visit   Subjective:   [name] is a 76 y.o. year old very pleasant male patient who presents for/with See problem oriented charting Chief Complaint  Patient presents with   Follow-up   Hypertension   Past Medical History-  Patient Active Problem List   Diagnosis Date Noted   History of transient ischemic attack (TIA) 10/24/2015    Priority: High   CHF (congestive heart failure), NYHA class II ([place]) 05/09/2014    Priority: High   Ulcerative pancolitis without complication ([place]) 19/14/7829    Priority: High   Atrial fibrillation ([place]) 12/08/2010    Priority: High   Type II diabetes mellitus with peripheral circulatory disorder ([place]) 04/16/2007    Priority: High   CAD (coronary artery disease), native coronary artery 04/12/2007    Priority: High   Hyperlipidemia 04/16/2007    Priority: Medium    Hypothyroidism 04/12/2007    Priority: Medium    Essential hypertension 04/12/2007    Priority: Medium    Obstructive sleep apnea 04/12/2007    Priority: Medium    Former smoker 08/11/2016    Priority: Low   MCI (mild cognitive impairment) 01/19/2016    Priority: Low   Primary osteoarthritis of left knee 02/09/2015    Priority: Low   Allergic rhinitis 06/25/2012    Priority: Low   GERD (gastroesophageal reflux disease) 08/09/2011    Priority: Low   VITAMIN B12 DEFICIENCY 11/29/2010    Priority: Low   Posterior vitreous detachment of both eyes 06/09/2022   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 difficile colitis 04/17/2019    Dehydration 04/15/2019   Diarrhea 04/15/2019   ARF (acute renal failure) ([place]) 04/15/2019   Chronic combined systolic and diastolic CHF (congestive heart failure) ([place]) 04/15/2019   Hypokalemia 04/15/2019   AKI (acute kidney injury) ([place])    Acute on chronic combined systolic and diastolic heart failure ([place]) 02/07/2018    Medications- reviewed and updated Current Outpatient Medications  Medication Sig Dispense Refill   apixaban (ELIQUIS) 5 MG TABS tablet Take 1 tablet (5 mg total) by mouth 2 (two) times daily. 180 tablet 3   APRISO 0.375 g 24 hr capsule TAKE 4 CAPSULES (1.5 GRAMS TOTAL) BY MOUTH DAILY 120 capsule 5   atorvastatin (LIPITOR) 20 MG tablet TAKE ONE TABLET (20MG TOTAL) BY MOUTH DAILY 30 tablet 5   carvedilol (COREG) 25 MG tablet TAKE ONE TABLET BY MOUTH TWICE A DAY 60 tablet 6   clopidogrel (PLAVIX) 75 MG tablet TAKE ONE TABLET (75MG TOTAL) BY MOUTH DAILY 90 tablet 3   Clotrimazole 1 % OINT Apply to penis twice daily after saline bath/rinse and pat dry (Patient taking differently: Apply 1 application  topically daily as needed (Apply to penis  after saline bath/rinse and pat dry).) 56.7 g 0   Coenzyme Q10 (COQ10 PO) Take 1 capsule by mouth daily with supper.     cyanocobalamin 1000 MCG tablet Take 1,000 mcg by mouth daily.     dicyclomine (BENTYL) 10 MG capsule TAKE ONE CAPSULE (10MG TOTAL) BY MOUTH TWO TIMES DAILY AS NEEDED FOR DIARRHEA OR  ABDOMINAL CRAMPING 90 capsule 3   fish oil-omega-3 fatty acids 1000 MG capsule Take 1 g by mouth daily with supper.     furosemide (LASIX) 40 MG tablet TAKE ONE TABLET (40MG TOTAL) BY MOUTH TWO TIMES DAILY 180 tablet 0   glucose blood test strip Use as instructed to test 4 time daily 400 each 3   isosorbide mononitrate (IMDUR) 60 MG 24 hr tablet TAKE ONE (1) TABLET BY MOUTH EVERY DAY 90 tablet 1   JARDIANCE 10 MG TABS tablet TAKE ONE (1) TABLET BY MOUTH EVERY DAY 90 tablet 3   metFORMIN (GLUCOPHAGE) 500 MG tablet TAKE ONE TABLET (500MG  TOTAL) BY MOUTH TWO TIMES DAILY WITH A MEAL 180 tablet 3   saccharomyces boulardii (FLORASTOR) 250 MG capsule Take 1 capsule (250 mg total) by mouth 2 (two) times daily.     sacubitril-valsartan (ENTRESTO) 97-103 MG Take 1 tablet by mouth 2 (two) times daily. 180 tablet 3   sitaGLIPtin (JANUVIA) 100 MG tablet TAKE ONE (1) TABLET BY MOUTH EVERY DAY 90 tablet 3   spironolactone (ALDACTONE) 25 MG tablet TAKE ONE (1) TABLET BY MOUTH EVERY DAY 90 tablet 3   SYNTHROID 50 MCG tablet TAKE ONE TABLET BY MOUTH DAILY BEFORE BREAKFAST 90 tablet 3   triamcinolone (KENALOG) 0.1 % Apply 1 application topically 2 (two) times daily. For 7-10 days maximum (Patient taking differently: Apply 1 application  topically daily as needed (Skin). For 7-10 days maximum) 80 g 0   No current facility-administered medications for this visit.     Objective:  BP 108/70   Pulse (!) 50   Temp 98.1 F (36.7 C)   Ht 5' 8"  (1.727 m)   Wt 222 lb 3.2 oz (100.8 kg)   SpO2 100%   BMI 33.79 kg/m  Gen: NAD, resting comfortably CV: irregularly irregular, bradycardic Lungs: CTAB no crackles, wheeze, rhonchi Ext: trace to 1+ edema Skin: warm, dry     Assessment and Plan   #HM- encouraged updated covid shot  #Congestive heart failure with ejection fraction of 25% during hospitalization 2019 later improved to 50-55% march 2022 S: Medication: Lasix 40 mg twice daily, Aldactone 25 mg, Entresto 97-185m, Jardiance 154m coreg 25 mg BID -weight stable from lats visit and no increased edema A/P: stable- continue current medicines    #%CAD/atrial fibrillation/hyperlipidemia. Follows with Dr. CoBurt Knackf cardiology S: Compliant with atorvastatin 2092mDL has been at goal under 70 Lab Results  Component Value Date   CHOL 98 03/03/2022   HDL 67.20 03/03/2022   LDLCALC 6 03/03/2022   LDLDIRECT 18.0 06/13/2017   TRIG 121.0 03/03/2022   CHOLHDL 1 03/03/2022  - still on plavix 75 mg for CAD and eliquis for a fib anticoagulation. On  carvedilol for rate control - no chest pain on Imdur  or shortness of breath  or palpitations A/P: lipids at goal- continue current medications. CAD asymptomatic- continue current medications . A fib- appropriately anticoagulated and rate controlled- continue current medicine  #hypertension S: Compliant with Coreg 25 mg twice a day, Imdur 60 mg, Entresto, spironolactone 25 mg, Lasix 40 mg BID BP Readings from Last 3 Encounters:  09/27/22 108/70  07/13/22 115/63  05/26/22 110/68  A/P:  stable- continue current medicines     #% Hypothyroidism S: Compliant with Synthroid 50 98g-most recently filled by Dr. GheCruzita Ledererb Results  Component Value Date   TSH 1.52 03/03/2022  A/P: hopefully stable- update tsah today. Continue current meds for now  - I am willing  to prescribe Synthroid if needed   % Ulcerative colitis S: Followed by GI- compliant with mesalamine/apriso with provider in [place]. Has to avoid chocolate- very lactose intolerant A/P:  doing well- continue current medications    #Diabetes-followed by Dr. Cruzita Lederer S:medication: metformin 500 mg BID and jardiance 10 mg Lab Results  Component Value Date   HGBA1C 6.0 (A) 05/26/2022   HGBA1C 6.2 (A) 11/18/2021   HGBA1C 6.9 (A) 07/14/2021  A/P: stable- continue current medicines. Needs microalb/cr ratio but honestly on very good therapy   #senile purpura- noted. Stable. Check cbc at least annually. Plavix and eliquis contribute.   Recommended follow up: Return in about 6 months (around 03/29/2023) for followup or sooner if needed.Schedule b4 you leave. Future Appointments  Date Time Provider [place]  11/01/2022  2:45 PM Gardiner Barefoot, DPM [facility]  11/25/2022  3:15 PM [facility] HEALTH COACH [facility]  12/01/2022  4:00 PM Philemon Kingdom, MD [facility] None  03/15/2023  9:15 AM Rankin, Clent Demark, MD [facility] None   Lab/Order associations:   ICD-10-CM   1. Type II diabetes mellitus with peripheral  circulatory disorder (HCC)  E11.51 Microalbumin / creatinine urine ratio    2. Hypothyroidism, unspecified type  E03.9 TSH    3. Essential hypertension  I10 Comprehensive metabolic panel    4. Senile purpura (HCC)  D69.2     5. Permanent atrial fibrillation (HCC)  I48.21     6. Coronary artery disease involving native coronary artery of native heart without angina pectoris  I25.10     7. Chronic systolic congestive heart failure, NYHA class 2 (HCC)  I50.22      Return precautions advised.  Garret Reddish, MD

## 2022-09-30 ENCOUNTER — Other Ambulatory Visit: Payer: Self-pay | Admitting: Cardiovascular Disease

## 2022-09-30 ENCOUNTER — Other Ambulatory Visit: Payer: Self-pay | Admitting: Family Medicine

## 2022-09-30 DIAGNOSIS — E785 Hyperlipidemia, unspecified: Secondary | ICD-10-CM

## 2022-10-31 ENCOUNTER — Other Ambulatory Visit: Payer: Self-pay | Admitting: Cardiovascular Disease

## 2022-11-01 ENCOUNTER — Ambulatory Visit: Payer: Medicare Other | Admitting: Podiatry

## 2022-11-01 ENCOUNTER — Encounter: Payer: Self-pay | Admitting: Podiatry

## 2022-11-01 ENCOUNTER — Telehealth: Payer: Self-pay | Admitting: Family Medicine

## 2022-11-01 ENCOUNTER — Ambulatory Visit (INDEPENDENT_AMBULATORY_CARE_PROVIDER_SITE_OTHER): Payer: Medicare Other | Admitting: Podiatry

## 2022-11-01 DIAGNOSIS — B351 Tinea unguium: Secondary | ICD-10-CM

## 2022-11-01 DIAGNOSIS — M79674 Pain in right toe(s): Secondary | ICD-10-CM | POA: Diagnosis not present

## 2022-11-01 DIAGNOSIS — S98131S Complete traumatic amputation of one right lesser toe, sequela: Secondary | ICD-10-CM

## 2022-11-01 DIAGNOSIS — D696 Thrombocytopenia, unspecified: Secondary | ICD-10-CM

## 2022-11-01 DIAGNOSIS — M79675 Pain in left toe(s): Secondary | ICD-10-CM | POA: Diagnosis not present

## 2022-11-01 NOTE — Telephone Encounter (Signed)
..  Type of form received: dmv placard  Additional comments:   Received by: patient   Form should be Faxed to:na   Form should be mailed to:  na   Is patient requesting call for pickup: yes    Form placed:   dr hunters fodler   Attach charge sheet.  Yes   Individual made aware of 3-5 business day turn around (Y/N)?   Yes   Pt wants to be called at [phone]

## 2022-11-01 NOTE — Telephone Encounter (Signed)
Noted  

## 2022-11-01 NOTE — Progress Notes (Signed)
This patient returns to my office for at risk foot care.  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  Patient has amputation second toe right foot.  This patient is unable to cut nails himself since the patient cannot reach his nails.These nails are painful walking and wearing shoes.  This patient presents for at risk foot care today.  General Appearance  Alert, conversant and in no acute stress.  Vascular  Dorsalis pedis and posterior tibial  pulses are weakly  palpable  bilaterally.  Capillary return is within normal limits  bilaterally. Temperature is within normal limits  bilaterally.  Neurologic  Senn-Weinstein monofilament wire test within normal limits  bilaterally. Muscle power within normal limits bilaterally.  Nails Thick disfigured discolored nails with subungual debris  from hallux to fifth toes  left and 1,3-5 right.. No evidence of bacterial infection or drainage bilaterally.  Orthopedic  No limitations of motion  feet .  No crepitus or effusions noted.  No bony pathology or digital deformities noted.  Skin  normotropic skin with no porokeratosis noted bilaterally.  No signs of infections or ulcers noted.     Onychomycosis  Pain in right toes  Pain in left toes  Consent was obtained for treatment procedures.   Mechanical debridement of nails 1-5  bilaterally performed with a nail nipper.  Filed with dremel without incident.    Return office visit   3 months                   Told patient to return for periodic foot care and evaluation due to potential at risk complications.   Tiffanny Lamarche DPM   

## 2022-11-02 NOTE — Telephone Encounter (Signed)
Form has been completed and placed up front for pick up, called and made pt aware.

## 2022-11-14 ENCOUNTER — Ambulatory Visit (INDEPENDENT_AMBULATORY_CARE_PROVIDER_SITE_OTHER): Payer: Medicare Other

## 2022-11-14 VITALS — Wt 222.0 lb

## 2022-11-14 DIAGNOSIS — Z0000 Encounter for general adult medical examination without abnormal findings: Secondary | ICD-10-CM | POA: Diagnosis not present

## 2022-11-14 NOTE — Patient Instructions (Signed)
[name] , Thank you for taking time to come for your Medicare Wellness Visit. I appreciate your ongoing commitment to your health goals. Please review the following plan we discussed and let me know if I can assist you in the future.   These are the goals we discussed:  Goals      Patient Stated     Stay active and alive      Patient Stated     Stay healthy         This is a list of the screening recommended for you and due dates:  Health Maintenance  Topic Date Due   COVID-19 Vaccine (4 - 2023-24 season) 06/10/2022   Zoster (Shingles) Vaccine (1 of 2) 12/27/2022*   Hepatitis C Screening: USPSTF Recommendation to screen - Ages 18-79 yo.  10/14/2098*   Hemoglobin A1C  11/26/2022   Complete foot exam   04/14/2023   Eye exam for diabetics  06/10/2023   Yearly kidney function blood test for diabetes  09/28/2023   Yearly kidney health urinalysis for diabetes  09/28/2023   Medicare Annual Wellness Visit  11/15/2023   Pneumonia Vaccine  Completed   Flu Shot  Completed   HPV Vaccine  Aged Out   DTaP/Tdap/Td vaccine  Discontinued   Colon Cancer Screening  Discontinued  *Topic was postponed. The date shown is not the original due date.    Advanced directives: Advance directive discussed with you today. Even though you declined this today please call our office should you change your mind and we can give you the proper paperwork for you to fill out.  Conditions/risks identified: stay healthy   Next appointment: Follow up in one year for your annual wellness visit.   Preventive Care 84 Years and Older, Male  Preventive care refers to lifestyle choices and visits with your health care provider that can promote health and wellness. What does preventive care include? A yearly physical exam. This is also called an annual well check. Dental exams once or twice a year. Routine eye exams. Ask your health care provider how often you should have your eyes checked. Personal lifestyle  choices, including: Daily care of your teeth and gums. Regular physical activity. Eating a healthy diet. Avoiding tobacco and drug use. Limiting alcohol use. Practicing safe sex. Taking low doses of aspirin every day. Taking vitamin and mineral supplements as recommended by your health care provider. What happens during an annual well check? The services and screenings done by your health care provider during your annual well check will depend on your age, overall health, lifestyle risk factors, and family history of disease. Counseling  Your health care provider may ask you questions about your: Alcohol use. Tobacco use. Drug use. Emotional well-being. Home and relationship well-being. Sexual activity. Eating habits. History of falls. Memory and ability to understand (cognition). Work and work Statistician. Screening  You may have the following tests or measurements: Height, weight, and BMI. Blood pressure. Lipid and cholesterol levels. These may be checked every 5 years, or more frequently if you are over 35 years old. Skin check. Lung cancer screening. You may have this screening every year starting at age 76 if you have a 30-pack-year history of smoking and currently smoke or have quit within the past 15 years. Fecal occult blood test (FOBT) of the stool. You may have this test every year starting at age 86. Flexible sigmoidoscopy or colonoscopy. You may have a sigmoidoscopy every 5 years or a colonoscopy every 10  years starting at age 11. Prostate cancer screening. Recommendations will vary depending on your family history and other risks. Hepatitis C blood test. Hepatitis B blood test. Sexually transmitted disease (STD) testing. Diabetes screening. This is done by checking your blood sugar (glucose) after you have not eaten for a while (fasting). You may have this done every 1-3 years. Abdominal aortic aneurysm (AAA) screening. You may need this if you are a current or  former smoker. Osteoporosis. You may be screened starting at age 50 if you are at high risk. Talk with your health care provider about your test results, treatment options, and if necessary, the need for more tests. Vaccines  Your health care provider may recommend certain vaccines, such as: Influenza vaccine. This is recommended every year. Tetanus, diphtheria, and acellular pertussis (Tdap, Td) vaccine. You may need a Td booster every 10 years. Zoster vaccine. You may need this after age 74. Pneumococcal 13-valent conjugate (PCV13) vaccine. One dose is recommended after age 34. Pneumococcal polysaccharide (PPSV23) vaccine. One dose is recommended after age 57. Talk to your health care provider about which screenings and vaccines you need and how often you need them. This information is not intended to replace advice given to you by your health care provider. Make sure you discuss any questions you have with your health care provider. Document Released: 10/23/2015 Document Revised: 06/15/2016 Document Reviewed: 07/28/2015 Elsevier Interactive Patient Education  2017 [place] Prevention in the Home Falls can cause injuries. They can happen to people of all ages. There are many things you can do to make your home safe and to help prevent falls. What can I do on the outside of my home? Regularly fix the edges of walkways and driveways and fix any cracks. Remove anything that might make you trip as you walk through a door, such as a raised step or threshold. Trim any bushes or trees on the path to your home. Use bright outdoor lighting. Clear any walking paths of anything that might make someone trip, such as rocks or tools. Regularly check to see if handrails are loose or broken. Make sure that both sides of any steps have handrails. Any raised decks and porches should have guardrails on the edges. Have any leaves, snow, or ice cleared regularly. Use sand or salt on walking paths  during winter. Clean up any spills in your garage right away. This includes oil or grease spills. What can I do in the bathroom? Use night lights. Install grab bars by the toilet and in the tub and shower. Do not use towel bars as grab bars. Use non-skid mats or decals in the tub or shower. If you need to sit down in the shower, use a plastic, non-slip stool. Keep the floor dry. Clean up any water that spills on the floor as soon as it happens. Remove soap buildup in the tub or shower regularly. Attach bath mats securely with double-sided non-slip rug tape. Do not have throw rugs and other things on the floor that can make you trip. What can I do in the bedroom? Use night lights. Make sure that you have a light by your bed that is easy to reach. Do not use any sheets or blankets that are too big for your bed. They should not hang down onto the floor. Have a firm chair that has side arms. You can use this for support while you get dressed. Do not have throw rugs and other things on the floor  that can make you trip. What can I do in the kitchen? Clean up any spills right away. Avoid walking on wet floors. Keep items that you use a lot in easy-to-reach places. If you need to reach something above you, use a strong step stool that has a grab bar. Keep electrical cords out of the way. Do not use floor polish or wax that makes floors slippery. If you must use wax, use non-skid floor wax. Do not have throw rugs and other things on the floor that can make you trip. What can I do with my stairs? Do not leave any items on the stairs. Make sure that there are handrails on both sides of the stairs and use them. Fix handrails that are broken or loose. Make sure that handrails are as long as the stairways. Check any carpeting to make sure that it is firmly attached to the stairs. Fix any carpet that is loose or worn. Avoid having throw rugs at the top or bottom of the stairs. If you do have throw  rugs, attach them to the floor with carpet tape. Make sure that you have a light switch at the top of the stairs and the bottom of the stairs. If you do not have them, ask someone to add them for you. What else can I do to help prevent falls? Wear shoes that: Do not have high heels. Have rubber bottoms. Are comfortable and fit you well. Are closed at the toe. Do not wear sandals. If you use a stepladder: Make sure that it is fully opened. Do not climb a closed stepladder. Make sure that both sides of the stepladder are locked into place. Ask someone to hold it for you, if possible. Clearly mark and make sure that you can see: Any grab bars or handrails. First and last steps. Where the edge of each step is. Use tools that help you move around (mobility aids) if they are needed. These include: Canes. Walkers. Scooters. Crutches. Turn on the lights when you go into a dark area. Replace any light bulbs as soon as they burn out. Set up your furniture so you have a clear path. Avoid moving your furniture around. If any of your floors are uneven, fix them. If there are any pets around you, be aware of where they are. Review your medicines with your doctor. Some medicines can make you feel dizzy. This can increase your chance of falling. Ask your doctor what other things that you can do to help prevent falls. This information is not intended to replace advice given to you by your health care provider. Make sure you discuss any questions you have with your health care provider. Document Released: 07/23/2009 Document Revised: 03/03/2016 Document Reviewed: 10/31/2014 Elsevier Interactive Patient Education  2017 [employer].

## 2022-11-14 NOTE — Progress Notes (Signed)
I connected with  [name] on 11/14/22 by a audio enabled telemedicine application and verified that I am speaking with the correct person using two identifiers.  Patient Location: Home  Provider Location: Office/Clinic  I discussed the limitations of evaluation and management by telemedicine. The patient expressed understanding and agreed to proceed.   Subjective:   [name] is a 77 y.o. male who presents for Medicare Annual/Subsequent preventive examination.  Review of Systems     Cardiac Risk Factors include: advanced age (>10men, >8 women);dyslipidemia;diabetes mellitus;obesity (BMI >30kg/m2);male gender;hypertension     Objective:    Today's Vitals   11/14/22 0903  Weight: 222 lb (100.7 kg)   Body mass index is 33.75 kg/m.     11/14/2022    9:07 AM 01/27/2022   11:00 AM 01/26/2022    6:03 PM 11/12/2021    3:20 PM 02/02/2021    1:37 PM 12/30/2020   12:45 AM 12/29/2020   11:26 PM  Advanced Directives  Does Patient Have a Medical Advance Directive? No No No Yes Yes  No  Type of Advance Directive    Healthcare Power of Attorney Living will    Does patient want to make changes to medical advance directive?     No - Patient declined    Copy of Healthcare Power of Attorney in Chart?    No - copy requested     Would patient like information on creating a medical advance directive? No - Patient declined No - Patient declined No - Patient declined  No - Patient declined No - Patient declined     Current Medications (verified) Outpatient Encounter Medications as of 11/14/2022  Medication Sig   apixaban (ELIQUIS) 5 MG TABS tablet Take 1 tablet (5 mg total) by mouth 2 (two) times daily.   APRISO 0.375 g 24 hr capsule TAKE 4 CAPSULES (1.5 GRAMS TOTAL) BY MOUTH DAILY   atorvastatin (LIPITOR) 20 MG tablet TAKE ONE TABLET (20MG  TOTAL) BY MOUTH DAILY   carvedilol (COREG) 25 MG tablet TAKE ONE TABLET BY MOUTH TWICE A DAY   clopidogrel (PLAVIX) 75 MG tablet TAKE ONE TABLET  (75MG  TOTAL) BY MOUTH DAILY   Clotrimazole 1 % OINT Apply to penis twice daily after saline bath/rinse and pat dry (Patient taking differently: Apply 1 application  topically daily as needed (Apply to penis  after saline bath/rinse and pat dry).)   Coenzyme Q10 (COQ10 PO) Take 1 capsule by mouth daily with supper.   cyanocobalamin 1000 MCG tablet Take 1,000 mcg by mouth daily.   dicyclomine (BENTYL) 10 MG capsule TAKE ONE CAPSULE (10MG  TOTAL) BY MOUTH TWO TIMES DAILY AS NEEDED FOR DIARRHEA OR ABDOMINAL CRAMPING   fish oil-omega-3 fatty acids 1000 MG capsule Take 1 g by mouth daily with supper.   furosemide (LASIX) 40 MG tablet TAKE ONE TABLET (40MG  TOTAL) BY MOUTH TWO TIMES DAILY   glucose blood test strip Use as instructed to test 4 time daily   isosorbide mononitrate (IMDUR) 60 MG 24 hr tablet TAKE ONE (1) TABLET BY MOUTH EVERY DAY   JARDIANCE 10 MG TABS tablet TAKE ONE (1) TABLET BY MOUTH EVERY DAY   metFORMIN (GLUCOPHAGE) 500 MG tablet TAKE ONE TABLET (500MG  TOTAL) BY MOUTH TWO TIMES DAILY WITH A MEAL   saccharomyces boulardii (FLORASTOR) 250 MG capsule Take 1 capsule (250 mg total) by mouth 2 (two) times daily.   sacubitril-valsartan (ENTRESTO) 97-103 MG Take 1 tablet by mouth 2 (two) times daily. Please keep scheduled appointment  sitaGLIPtin (JANUVIA) 100 MG tablet TAKE ONE (1) TABLET BY MOUTH EVERY DAY   spironolactone (ALDACTONE) 25 MG tablet TAKE ONE (1) TABLET BY MOUTH EVERY DAY   SYNTHROID 50 MCG tablet TAKE ONE TABLET BY MOUTH DAILY BEFORE BREAKFAST   triamcinolone (KENALOG) 0.1 % Apply 1 application topically 2 (two) times daily. For 7-10 days maximum (Patient taking differently: Apply 1 application  topically daily as needed (Skin). For 7-10 days maximum)   No facility-administered encounter medications on file as of 11/14/2022.    Allergies (verified) Clindamycin/lincomycin   History: Past Medical History:  Diagnosis Date   B12 deficiency    per patient previously taking  shots   C. difficile colitis 04/17/2019   C. difficile diarrhea    CAD (coronary artery disease), native coronary artery 04/12/2007   Chronic atrial fibrillation ([place])    initial diagnoses 2012   Chronic combined systolic and diastolic CHF (congestive heart failure) ([place]) 04/15/2019   CKD (chronic kidney disease), stage III ([place]) 04/15/2019   CORONARY ARTERY DISEASE 04/12/2007   2 stents last in 2006.    Cystoid macular edema of right eye 01/16/2020   Cystoid macular edema of right eye 01/16/2020   DIABETES MELLITUS, TYPE II 04/16/2007   Diverticulosis of colon (without mention of hemorrhage)    GERD (gastroesophageal reflux disease) 08/09/2011   Hyperlipidemia 04/16/2007   10/24 reveal study end. Atorvastatin 20mg    HYPERTENSION 04/12/2007   HYPOTHYROIDISM 04/12/2007   Ischemic cardiomyopathy    Morbid obesity ([place]) 08/09/2011   MYOCARDIAL INFARCTION, HX OF 04/12/2007   Obstructive sleep apnea 04/12/2007   02/2011 - AHI 96/h CPAP 13, Lg FF      Stroke (HCC)    Type II diabetes mellitus with peripheral circulatory disorder ([place]) 04/16/2007   Dr. Cruzita Lederer manages. Uses fructosamine.     ULCERATIVE COLITIS, LEFT SIDED 11/23/2010   Ulcerative pancolitis without complication ([place]) 69/48/5462   Ulcerative Colitis. Mesalamine.    VITAMIN B12 DEFICIENCY 11/29/2010   Past Surgical History:  Procedure Laterality Date   CARDIAC CATHETERIZATION  10/2002   STENT. 2 stents Dr. Maurene Capes   CARDIAC CATHETERIZATION N/A 10/28/2015   Procedure: Right/Left Heart Cath and Coronary Angiography;  Surgeon: Sherren Mocha, MD;  Location: [facility];  Service: Cardiovascular;  Laterality: N/A;   CATARACT EXTRACTION     2021 bilateral eyes    COLONOSCOPY  2012   Dr. Sharlett Iles; findings consistent with universal UC s/p random biopsies, no active bleeding, polyps, masses.  Pathology with mild chronic active colitis.  Repeat in 5 years.   CORONARY STENT PLACEMENT  2008   LAD    I & D EXTREMITY Right 01/27/2022   Procedure:  RIGHT SECOND METARTSOPHALANGEAL JOINT DISARTICULATION, IRRIGATION AND DEBRIDEMENT OF ABSCESS RIGHT FOOT;  Surgeon: Erle Crocker, MD;  Location: [facility];  Service: Orthopedics;  Laterality: Right;   RIGHT/LEFT HEART CATH AND CORONARY ANGIOGRAPHY N/A 06/01/2018   Procedure: RIGHT/LEFT HEART CATH AND CORONARY ANGIOGRAPHY;  Surgeon: Sherren Mocha, MD;  Location: [facility];  Service: Cardiovascular;  Laterality: N/A;   TONSILLECTOMY     Family History  Problem Relation Age of Onset   Heart attack Mother        mid 42s   Heart disease Father        H/O CAD, CABG, VALVE SURGERY   Hypertension Brother    Obesity Brother    Stomach cancer Maternal Aunt    Stomach cancer Paternal Grandmother        ?  colon    Colon cancer Neg Hx    Social History   Socioeconomic History   Marital status: Married    Spouse name: Not on file   Number of children: 1   Years of education: Not on file   Highest education level: Not on file  Occupational History   Occupation: RETIRED    Employer: RETIRED  Tobacco Use   Smoking status: Former    Packs/day: 1.00    Years: 5.00    Total pack years: 5.00    Types: Cigarettes    Quit date: 04/13/1966    Years since quitting: 56.6   Smokeless tobacco: Never  Vaping Use   Vaping Use: Never used  Substance and Sexual Activity   Alcohol use: No    Alcohol/week: 0.0 standard drinks of alcohol    Comment: no more beer   Drug use: No   Sexual activity: Not Currently  Other Topics Concern   Not on file  Social History Narrative   Cardiorehab 3 days a week. 45 minutes to an hour-stationary bike.    GRANDDAUGHTER (MS. 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       Married for 37 years in 2015, married previously for 14 years. Daughter with first wife and 3 grandkids.    Lives alone with wife. Get to see grandkids a lot. New grandchild in middle  of 5      Hobbies-yardwork, previously liked to hunt and fish, does some target shooting      Land for 6 yrs   Social Determinants of Health   Financial Resource Strain: Low Risk  (11/14/2022)   Overall Financial Resource Strain (CARDIA)    Difficulty of Paying Living Expenses: Not hard at all  Food Insecurity: No Food Insecurity (11/14/2022)   Hunger Vital Sign    Worried About Running Out of Food in the Last Year: Never true    Ran Out of Food in the Last Year: Never true  Transportation Needs: No Transportation Needs (11/14/2022)   PRAPARE - Administrator, Civil Service (Medical): No    Lack of Transportation (Non-Medical): No  Physical Activity: Inactive (11/14/2022)   Exercise Vital Sign    Days of Exercise per Week: 0 days    Minutes of Exercise per Session: 0 min  Stress: No Stress Concern Present (11/14/2022)   [employer] of Occupational Health - Occupational Stress Questionnaire    Feeling of Stress : Not at all  Social Connections: Moderately Integrated (11/14/2022)   Social Connection and Isolation Panel [NHANES]    Frequency of Communication with Friends and Family: More than three times a week    Frequency of Social Gatherings with Friends and Family: More than three times a week    Attends Religious Services: More than 4 times per year    Active Member of [employer] or Organizations: No    Attends Engineer, structural: Never    Marital Status: Married    Tobacco Counseling Counseling given: Not Answered   Clinical Intake:  Pre-visit preparation completed: Yes  Pain : No/denies pain     BMI - recorded: 33.75 Nutritional Status: BMI > 30  Obese Nutritional Risks: None Diabetes: Yes (138 per pt) CBG done?: Yes (138 per pt) CBG resulted in Enter/ Edit results?: No Did pt. bring in CBG monitor from  home?: No  How often do you need to have someone help you when you read instructions, pamphlets, or other written materials from  your doctor or pharmacy?: 1 - Never  Diabetic?Nutrition Risk Assessment:  Has the patient had any N/V/D within the last 2 months?  No  Does the patient have any non-healing wounds?  No  Has the patient had any unintentional weight loss or weight gain?  No   Diabetes:  Is the patient diabetic?  Yes  If diabetic, was a CBG obtained today?  Yes  Did the patient bring in their glucometer from home?  No  How often do you monitor your CBG's? Daily .   Financial Strains and Diabetes Management:  Are you having any financial strains with the device, your supplies or your medication? No .  Does the patient want to be seen by Chronic Care Management for management of their diabetes?  No  Would the patient like to be referred to a Nutritionist or for Diabetic Management?  No   Diabetic Exams:  Diabetic Eye Exam: Completed 06/09/22 Diabetic Foot Exam: Completed 04/13/22  Interpreter Needed?: No  Information entered by :: Charlott Rakes, LPN   Activities of Daily Living    11/14/2022    9:09 AM 01/27/2022   11:15 AM  In your present state of health, do you have any difficulty performing the following activities:  Hearing? 1   Comment slight loss and ringing in ear   Vision? 0   Difficulty concentrating or making decisions? 0   Walking or climbing stairs? 0   Dressing or bathing? 0   Doing errands, shopping? 0 1  Preparing Food and eating ? N   Using the Toilet? N   In the past six months, have you accidently leaked urine? N   Do you have problems with loss of bowel control? N   Managing your Medications? N   Managing your Finances? N   Housekeeping or managing your Housekeeping? N     Patient Care Team: Marin Olp, MD as PCP - General (Family Medicine) Sherren Mocha, MD as PCP - Cardiology (Cardiology) Thompson Grayer, MD (Inactive) as PCP - Electrophysiology (Cardiology) Bensimhon, Shaune Pascal, MD as PCP - Advanced Heart Failure (Cardiology) Rutherford Guys, MD as Consulting  Physician (Ophthalmology) Philemon Kingdom, MD as Consulting Physician (Endocrinology) Ladene Artist, MD as Consulting Physician (Gastroenterology) Gala Romney Cristopher Estimable, MD as Consulting Physician (Gastroenterology) Gala Romney Cristopher Estimable, MD as Consulting Physician (Gastroenterology)  Indicate any recent Medical Services you may have received from other than [facility] providers in the past year (date may be approximate).     Assessment:   This is a routine wellness examination for [facility]  Hearing/Vision screen Hearing Screening - Comments:: Pt stated slight loss with ringing in ears  Vision Screening - Comments:: Pt follows up with Dr [name] and Dr Zadie Rhine for annual eye exams   Dietary issues and exercise activities discussed: Current Exercise Habits: The patient does not participate in regular exercise at present   Goals Addressed             This Visit's Progress    Patient Stated       Stay healthy        Depression Screen    11/14/2022    9:06 AM 11/12/2021    3:18 PM 09/01/2021   10:53 AM 02/02/2021    1:44 PM 01/13/2021   11:20 AM 11/06/2020    3:26 PM 09/02/2020   11:04 AM  PHQ 2/9 Scores  PHQ - 2 Score 0 0 0 0 0 0 0    Fall Risk    11/14/2022    9:09 AM 11/12/2021    3:21 PM 09/30/2021   10:33 AM 09/01/2021   10:53 AM 03/03/2021   11:19 AM  Fall Risk   Falls in the past year? 0 0 0 0 0  Number falls in past yr: 0 0 0 0 0  Injury with Fall? 0 0 0 0 0  Risk for fall due to : Impaired vision Impaired vision Impaired vision No Fall Risks   Follow up Falls prevention discussed Falls prevention discussed Falls evaluation completed;Education provided;Falls prevention discussed      FALL RISK PREVENTION PERTAINING TO THE HOME:  Any stairs in or around the home? No  If so, are there any without handrails? No  Home free of loose throw rugs in walkways, pet beds, electrical cords, etc? Yes  Adequate lighting in your home to reduce risk of falls? Yes   ASSISTIVE DEVICES  UTILIZED TO PREVENT FALLS:  Life alert? Yes  Use of a cane, walker or w/c? No  Grab bars in the bathroom? Yes  Shower chair or bench in shower? Yes  Elevated toilet seat or a handicapped toilet? No   TIMED UP AND GO:  Was the test performed? No .   Cognitive Function:    03/03/2021    3:41 PM  MMSE - Mini Mental State Exam  Orientation to time 5  Orientation to Place 5  Registration 3  Attention/ Calculation 5  Recall 3  Language- name 2 objects 2  Language- repeat 1  Language- follow 3 step command 3  Language- read & follow direction 1  Write a sentence 1  Copy design 1  Total score 30        11/14/2022    9:10 AM 11/12/2021    3:22 PM 11/06/2020    3:36 PM 08/26/2019   10:40 AM  6CIT Screen  What Year? 0 points 0 points 0 points 0 points  What month? 0 points 0 points 0 points 0 points  What time? 0 points 0 points  0 points  Count back from 20 0 points 0 points 0 points 0 points  Months in reverse 0 points 0 points 0 points 0 points  Repeat phrase 0 points 0 points 2 points 0 points  Total Score 0 points 0 points  0 points    Immunizations Immunization History  Administered Date(s) Administered   Fluad Quad(high Dose 65+) 07/18/2019, 07/08/2020, 07/14/2021, 08/31/2022   Influenza Split 07/06/2011, 06/25/2012   Influenza Whole 07/14/2008, 08/03/2009, 08/24/2010   Influenza, High Dose Seasonal PF 08/10/2016, 06/13/2017, 06/20/2018   Influenza,inj,Quad PF,6+ Mos 06/03/2013, 07/11/2014, 06/29/2015   Moderna SARS-COV2 Booster Vaccination 03/17/2021   Moderna Sars-Covid-2 Vaccination 01/02/2020, 02/04/2020, 09/08/2020   Pneumococcal Conjugate-13 02/09/2015   Pneumococcal Polysaccharide-23 07/06/2011   Td 09/22/2010   Zoster, Live 07/19/2011      Flu Vaccine status: Up to date  Pneumococcal vaccine status: Up to date  Covid-19 vaccine status: Completed vaccines  Qualifies for Shingles Vaccine? Yes   Zostavax completed No   Shingrix Completed?: No.     Education has been provided regarding the importance of this vaccine. Patient has been advised to call insurance company to determine out of pocket expense if they have not yet received this vaccine. Advised may also receive vaccine at local pharmacy or Health Dept. Verbalized acceptance and understanding.  Screening Tests  Health Maintenance  Topic Date Due   COVID-19 Vaccine (4 - 2023-24 season) 06/10/2022   Zoster Vaccines- Shingrix (1 of 2) 12/27/2022 (Originally 01/20/1965)   Hepatitis C Screening  10/14/2098 (Originally 01/21/1964)   HEMOGLOBIN A1C  11/26/2022   FOOT EXAM  04/14/2023   OPHTHALMOLOGY EXAM  06/10/2023   Diabetic kidney evaluation - eGFR measurement  09/28/2023   Diabetic kidney evaluation - Urine ACR  09/28/2023   Medicare Annual Wellness (AWV)  11/15/2023   Pneumonia Vaccine 65+ Years old  Completed   INFLUENZA VACCINE  Completed   HPV VACCINES  Aged Out   DTaP/Tdap/Td  Discontinued   COLONOSCOPY (Pts 45-64yrs Insurance coverage will need to be confirmed)  Discontinued    Health Maintenance  Health Maintenance Due  Topic Date Due   COVID-19 Vaccine (4 - 2023-24 season) 06/10/2022    Colorectal cancer screening: No longer required.   Additional Screening:  Hepatitis C Screening: does qualify;  Vision Screening: Recommended annual ophthalmology exams for early detection of glaucoma and other disorders of the eye. Is the patient up to date with their annual eye exam?  Yes  Who is the provider or what is the name of the office in which the patient attends annual eye exams? Dr Nile Riggs  If pt is not established with a provider, would they like to be referred to a provider to establish care? No .   Dental Screening: Recommended annual dental exams for proper oral hygiene  Community Resource Referral / Chronic Care Management: CRR required this visit?  No   CCM required this visit?  No      Plan:     I have personally reviewed and noted the following in  the patient's chart:   Medical and social history Use of alcohol, tobacco or illicit drugs  Current medications and supplements including opioid prescriptions. Patient is not currently taking opioid prescriptions. Functional ability and status Nutritional status Physical activity Advanced directives List of other physicians Hospitalizations, surgeries, and ER visits in previous 12 months Vitals Screenings to include cognitive, depression, and falls Referrals and appointments  In addition, I have reviewed and discussed with patient certain preventive protocols, quality metrics, and best practice recommendations. A written personalized care plan for preventive services as well as general preventive health recommendations were provided to patient.     Marzella Schlein, LPN   05/10/2750   Nurse Notes: none

## 2022-11-21 ENCOUNTER — Other Ambulatory Visit: Payer: Self-pay | Admitting: Cardiovascular Disease

## 2022-11-21 NOTE — Telephone Encounter (Signed)
Pt last saw Dr Burt Knack 12/28/21, last labs 09/27/22 Creat 1.29, age 77, weight 100.7kg, based on specified criteria pt is on appropriate dosage of Eliquis 24m BID for afib.  Will refill rx.

## 2022-11-28 ENCOUNTER — Other Ambulatory Visit: Payer: Self-pay | Admitting: Gastroenterology

## 2022-11-28 ENCOUNTER — Other Ambulatory Visit (HOSPITAL_COMMUNITY): Payer: Self-pay | Admitting: Internal Medicine

## 2022-11-28 DIAGNOSIS — K5100 Ulcerative (chronic) pancolitis without complications: Secondary | ICD-10-CM

## 2022-11-29 NOTE — Progress Notes (Unsigned)
Cardiology Office Note:    Date:  12/01/2022   ID:  [name], DOB [birth year], MRN [id number]  PCP:  Marin Olp, [place] Prov[facility] Cardiologist:  Sherren Mocha, MD Electrophysiologist:  Thompson Grayer, MD (Inactive)  Advanced Heart Failure:  Glori Bickers, MD { Referring MD: Marin Olp, MD   Chief Complaint  Patient presents with   Follow-up    CAD    History of Present Illness:    [name] is a 77 y.o. male with a hx of chronic combined systolic and diastolic heart failure, PAF, CAD, HTN, HLD, CKD III, OSA, DM2, neuropathy, GERD, former smoker, and morbid obesity. He has a history of CAD s/p DES-LAD 2004, STEMI 2008 due to very late stent thrombosis s/p PTCA/DES-LAD.   He has a history of LVEF as low as 25-30% and has fluctuated to 40-45% and was improved to low normal on last echo 12/2020 at 50-55%. GDMT: 25 mg coreg BID, 60 mg imdur, 97-103 mg entresto BID, 10 mg jardiance, and 25 mg spironolactone daily. Given afib, he is maintained on plavix and eliquis. He was last seen in clinic 12/28/21 with [name] and was doing well with weight loss.     Last heart cath 05/2018 with patent left main, LAD, and LCX, severe ostial LCX and ostial OM lesions unchanged from previous [place] 2017. There was slow flow in D1 collateralized from the right PDA. Medical therapy continued.   He presents today for routine follow up. He is here alone. He remains active, push mows 2 hrs, rolls trash cans 1/8 of a mile. He also maintains 4 cars. He denies angina, SOB, orthopnea, or PND. He has no cardiac complaints, doing well.   Past Medical History:  Diagnosis Date   B12 deficiency    per patient previously taking shots   C. difficile colitis 04/17/2019   C. difficile diarrhea    CAD (coronary artery disease), native coronary artery 04/12/2007   Chronic atrial fibrillation ([place])    initial diagnoses 2012   Chronic combined systolic and diastolic CHF  (congestive heart failure) ([place]) 04/15/2019   CKD (chronic kidney disease), stage III ([place]) 04/15/2019   CORONARY ARTERY DISEASE 04/12/2007   2 stents last in 2006.    Cystoid macular edema of right eye 01/16/2020   Cystoid macular edema of right eye 01/16/2020   DIABETES MELLITUS, TYPE II 04/16/2007   Diverticulosis of colon (without mention of hemorrhage)    GERD (gastroesophageal reflux disease) 08/09/2011   Hyperlipidemia 04/16/2007   10/24 reveal study end. Atorvastatin 22m   HYPERTENSION 04/12/2007   HYPOTHYROIDISM 04/12/2007   Ischemic cardiomyopathy    Morbid obesity ([facility] 08/09/2011   MYOCARDIAL INFARCTION, HX OF 04/12/2007   Obstructive sleep apnea 04/12/2007   02/2011 - AHI 96/h CPAP 13, Lg FF      Stroke (HCC)    Type II diabetes mellitus with peripheral circulatory disorder ([facility] 04/16/2007   Dr. GCruzita Lederermanages. Uses fructosamine.     ULCERATIVE COLITIS, LEFT SIDED 11/23/2010   Ulcerative pancolitis without complication ([facility] [id number]  Ulcerative Colitis. Mesalamine.    VITAMIN B12 DEFICIENCY 11/29/2010    Past Surgical History:  Procedure Laterality Date   CARDIAC CATHETERIZATION  10/2002   STENT. 2 stents Dr. BMaurene Capes  CARDIAC CATHETERIZATION N/A 10/28/2015   Procedure: Right/Left Heart Cath and Coronary Angiography;  Surgeon: MSherren Mocha MD;  [facility];  Service: Cardiovascular;  Laterality: N/A;  CATARACT EXTRACTION     2021 bilateral eyes    COLONOSCOPY  2012   Dr. Sharlett Iles; findings consistent with universal UC s/p random biopsies, no active bleeding, polyps, masses.  Pathology with mild chronic active colitis.  Repeat in 5 years.   CORONARY STENT PLACEMENT  2008   LAD    I & D EXTREMITY Right 01/27/2022   Procedure: RIGHT SECOND METARTSOPHALANGEAL JOINT DISARTICULATION, IRRIGATION AND DEBRIDEMENT OF ABSCESS RIGHT FOOT;  Surgeon: Erle Crocker, MD;  Location: [facility];  Service: Orthopedics;  Laterality: Right;   RIGHT/LEFT HEART CATH AND CORONARY  ANGIOGRAPHY N/A 06/01/2018   Procedure: RIGHT/LEFT HEART CATH AND CORONARY ANGIOGRAPHY;  Surgeon: Sherren Mocha, MD;  Location: [facility];  Service: Cardiovascular;  Laterality: N/A;   TONSILLECTOMY      Current Medications: Current Meds  Medication Sig   apixaban (ELIQUIS) 5 MG TABS tablet TAKE ONE TABLET (5MG TOTAL) BY MOUTH TWO TIMES DAILY (REPLACES PRADAXA)   atorvastatin (LIPITOR) 20 MG tablet TAKE ONE TABLET (20MG TOTAL) BY MOUTH DAILY   carvedilol (COREG) 25 MG tablet TAKE ONE TABLET BY MOUTH TWICE A DAY   clopidogrel (PLAVIX) 75 MG tablet TAKE ONE TABLET (75MG TOTAL) BY MOUTH DAILY   Clotrimazole 1 % OINT Apply to penis twice daily after saline bath/rinse and pat dry (Patient taking differently: Apply 1 application  topically daily as needed (Apply to penis  after saline bath/rinse and pat dry).)   Coenzyme Q10 (COQ10 PO) Take 1 capsule by mouth daily with supper.   cyanocobalamin 1000 MCG tablet Take 1,000 mcg by mouth daily.   dicyclomine (BENTYL) 10 MG capsule TAKE ONE CAPSULE (10MG TOTAL) BY MOUTH TWO TIMES DAILY AS NEEDED FOR DIARRHEA OR ABDOMINAL CRAMPING   fish oil-omega-3 fatty acids 1000 MG capsule Take 1 g by mouth daily with supper.   furosemide (LASIX) 40 MG tablet TAKE ONE TABLET (40MG TOTAL) BY MOUTH TWO TIMES DAILY   glucose blood test strip Use as instructed to test 4 time daily   isosorbide mononitrate (IMDUR) 60 MG 24 hr tablet TAKE ONE (1) TABLET BY MOUTH EVERY DAY   JARDIANCE 10 MG TABS tablet TAKE ONE (1) TABLET BY MOUTH EVERY DAY   mesalamine (APRISO) 0.375 g 24 hr capsule TAKE 4 CAPSULES (1.5 GRAMS TOTAL) BY MOUTH DAILY   metFORMIN (GLUCOPHAGE) 500 MG tablet TAKE ONE TABLET (500MG TOTAL) BY MOUTH TWO TIMES DAILY WITH A MEAL   saccharomyces boulardii (FLORASTOR) 250 MG capsule Take 1 capsule (250 mg total) by mouth 2 (two) times daily.   sacubitril-valsartan (ENTRESTO) 97-103 MG Take 1 tablet by mouth 2 (two) times daily. Please keep scheduled  appointment   sitaGLIPtin (JANUVIA) 100 MG tablet TAKE ONE (1) TABLET BY MOUTH EVERY DAY   spironolactone (ALDACTONE) 25 MG tablet TAKE ONE (1) TABLET BY MOUTH EVERY DAY   SYNTHROID 50 MCG tablet TAKE ONE TABLET BY MOUTH DAILY BEFORE BREAKFAST   triamcinolone (KENALOG) 0.1 % Apply 1 application topically 2 (two) times daily. For 7-10 days maximum (Patient taking differently: Apply 1 application  topically daily as needed (Skin). For 7-10 days maximum)     Allergies:   Clindamycin/lincomycin   Social History   Socioeconomic History   Marital status: Married    Spouse name: Not on file   Number of children: 1   Years of education: Not on file   Highest education level: Not on file  Occupational History   Occupation: RETIRED    Employer: RETIRED  Tobacco Use   Smoking status: Former    Packs/day: 1.00    Years: 5.00    Total pack years: 5.00    Types: Cigarettes    Quit date: 04/13/1966    Years since quitting: 56.6   Smokeless tobacco: Never  Vaping Use   Vaping Use: Never used  Substance and Sexual Activity   Alcohol use: No    Alcohol/week: 0.0 standard drinks of alcohol    Comment: no more beer   Drug use: No   Sexual activity: Not Currently  Other Topics Concern   Not on file  Social History Narrative   Cardiorehab 3 days a week. 45 minutes to an hour-stationary bike.    GRANDDAUGHTER (MS. 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       Married for 37 years in 2015, married previously for 14 years. Daughter with first wife and 3 grandkids.    Lives alone with wife. Get to see grandkids a lot. New grandchild in middle of 50      Hobbies-yardwork, previously liked to hunt and fish, does some target shooting      Tourist information centre manager for 6 yrs   Social Determinants of Health   Financial Resource Strain: Low Risk  (11/14/2022)   Overall Financial Resource Strain (CARDIA)     Difficulty of Paying Living Expenses: Not hard at all  Food Insecurity: No Food Insecurity (11/14/2022)   Hunger Vital Sign    Worried About Running Out of Food in the Last Year: Never true    Ran Out of Food in the Last Year: Never true  Transportation Needs: No Transportation Needs (11/14/2022)   PRAPARE - Hydrologist (Medical): No    Lack of Transportation (Non-Medical): No  Physical Activity: Inactive (11/14/2022)   Exercise Vital Sign    Days of Exercise per Week: 0 days    Minutes of Exercise per Session: 0 min  Stress: No Stress Concern Present (11/14/2022)   [place]    Feeling of Stress : Not at all  Social Connections: Moderately Integrated (11/14/2022)   Social Connection and Isolation Panel [NHANES]    Frequency of Communication with Friends and Family: More than three times a week    Frequency of Social Gatherings with Friends and Family: More than three times a week    Attends Religious Services: More than 4 times per year    Active Member of [employer] or Organizations: No    Attends Music therapist: Never    Marital Status: Married     Family History: The patient's family history includes Heart attack in his mother; Heart disease in his father; Hypertension in his brother; Obesity in his brother; Stomach cancer in his maternal aunt and paternal grandmother. There is no history of Colon cancer.  ROS:   Please see the history of present illness.     All other systems reviewed and are negative.  EKGs/Labs/Other Studies Reviewed:    The following studies were reviewed today:  Echo 12/28/20: IMPRESSIONS    1. Poor acoustic windows limit study, even with the use of Definity.  Overall LVEF is probably low normal with severe hypokinesis of the  anteroseptal wall. Compared to echo from 04/29/18, LVEF is improved. . Left  ventricular ejection fraction, by  estimation,  is  50 to 55%. The left ventricle has low normal function.  There is mild left ventricular hypertrophy. Left ventricular diastolic  parameters are indeterminate.   2. Right ventricular systolic function is normal. The right ventricular  size is normal.   3. Left atrial size was mildly dilated.   4. The mitral valve is normal in structure. No evidence of mitral valve  regurgitation.   5. The aortic valve is abnormal. Aortic valve regurgitation is not  visualized. Mild to moderate aortic valve sclerosis/calcification is  present, without any evidence of aortic stenosis.   6. The inferior vena cava is normal in size with <50% respiratory  variability, suggesting right atrial pressure of 8 mmHg.     Cath 2019: 1st Diag lesion is 100% stenosed. Ost Ramus lesion is 50% stenosed. Ost Cx lesion is 80% stenosed. Ost 2nd Mrg to 2nd Mrg lesion is 95% stenosed. Mid RCA lesion is 25% stenosed. Mid LM to Dist LM lesion is 25% stenosed. Ost LAD to Prox LAD lesion is 20% stenosed.   1.  Widely patent left main, LAD, and left circumflex 2.  Severe ostial circumflex and ostial OM lesions unchanged from the previous cath study in 2017 3.  Slow flow in the first diagonal branch of the LAD, collateralized from the right PDA 4.  Well compensated left and right heart hemodynamics with low cardiac output calculated by Fick, possibly related to the Fick calculation with the patient's morbid obesity   Recommendations: Stable coronary anatomy, ongoing medical therapy for heart failure.  Left circumflex/obtuse marginal stenoses not approachable by PCI based on ostial lesion locations.  EKG:  EKG is  ordered today.  The ekg ordered today demonstrates atrial fibrillation with VR 73  Recent Labs: 03/03/2022: Hemoglobin 14.9; Platelets 156.0 09/27/2022: ALT 8; BUN 17; Creatinine, Ser 1.29; Potassium 4.6; Sodium 140; TSH 1.02  Recent Lipid Panel    Component Value Date/Time   CHOL 98 03/03/2022 1342   CHOL 74 (L)  02/07/2018 0859   TRIG 121.0 03/03/2022 1342   HDL 67.20 03/03/2022 1342   HDL 45 02/07/2018 0859   CHOLHDL 1 03/03/2022 1342   VLDL 24.2 03/03/2022 1342   LDLCALC 6 03/03/2022 1342   LDLCALC 13 02/07/2018 0859   LDLDIRECT 18.0 06/13/2017 1504     Risk Assessment/Calculations:    CHA2DS2-VASc Score = 6   This indicates a 9.7% annual risk of stroke. The patient's score is based upon: CHF History: 1 HTN History: 1 Diabetes History: 1 Stroke History: 0 Vascular Disease History: 1 Age Score: 2 Gender Score: 0   Physical Exam:    VS:  BP 112/76   Pulse 73   Ht 5' 8"$  (1.727 m)   Wt 221 lb 6.4 oz (100.4 kg)   SpO2 98%   BMI 33.66 kg/m     Wt Readings from Last 3 Encounters:  12/01/22 221 lb 6.4 oz (100.4 kg)  11/14/22 222 lb (100.7 kg)  09/27/22 222 lb 3.2 oz (100.8 kg)     GEN:  Well nourished, well developed in no acute distress HEENT: Normal NECK: No JVD; No carotid bruits LYMPHATICS: No lymphadenopathy CARDIAC: RRR, no murmurs, rubs, gallops RESPIRATORY:  Clear to auscultation without rales, wheezing or rhonchi  ABDOMEN: Soft, non-tender, non-distended MUSCULOSKELETAL:  No edema; No deformity  SKIN: Warm and dry NEUROLOGIC:  Alert and oriented x 3 PSYCHIATRIC:  Normal affect   ASSESSMENT:    1. Coronary artery disease involving native coronary artery of native heart without  angina pectoris   2. Chronic atrial fibrillation (HCC)   3. Chronic anticoagulation   4. Chronic combined systolic and diastolic CHF (congestive heart failure) ([place])   5. Primary hypertension   6. Hyperlipidemia LDL goal <70   7. Preoperative cardiovascular examination    PLAN:    In order of problems listed above:  CAD Hx of very late stent thrombosis Stable disease and collaterals on last heart cath 2019 Continue Plavix monotherapy in the setting of eliquis   Chronic Afib EKG today with rate controlled AFib Continue BB Asymptomatic    Chronic systolic and diastolic  heart failure Hypertension EF 50-55% on most recent echocardiogram in 2022 GDMT: 25 mg coreg BID, 60 mg imdur, 97-103 mg entresto BID, and 25 mg spironolactone daily, 10 mg jardiance Lower extremity swelling when he sits for long periods of time, generally euvolemic.  BMP with PCP in Dec 2023 stable   Hyperlipidemia 20 mg lipior 03/03/2022: Cholesterol 98; HDL 67.20; LDL Cholesterol 6; Triglycerides 121.0; VLDL 24.2 Will check with PCP in April 2024   Morbid obesity DM 2 He has lost a considerable amount of weight His A1c has improved   Preoperative risk evaluation States he will need extraction of one wisdom tooth under local anesthesia. I explained that guidelines recommend no specific cardiac clearance or OAC hold for a single extraction. If it is more involved, may hold eliquis the night before.    Follow up in 1 year with [name].       Medication Adjustments/Labs and Tests Ordered: Current medicines are reviewed at length with the patient today.  Concerns regarding medicines are outlined above.  Orders Placed This Encounter  Procedures   EKG 12-Lead   No orders of the defined types were placed in this encounter.   Patient Instructions  Medication Instructions:  No changes *If you need a refill on your cardiac medications before your next appointment, please call your pharmacy*   Lab Work: No Labs If you have labs (blood work) drawn today and your tests are completely normal, you will receive your results only by: [place] (if you have MyChart) OR A paper copy in the mail If you have any lab test that is abnormal or we need to change your treatment, we will call you to review the results.   Testing/Procedures: No Testing   Follow-Up: At [facility], you and your health needs are our priority.  As part of our continuing mission to provide you with exceptional heart care, we have created designated Provider Care Teams.  These Care Teams  include your primary Cardiologist (physician) and Advanced Practice Providers (APPs -  Physician Assistants and Nurse Practitioners) who all work together to provide you with the care you need, when you need it.  We recommend signing up for the patient portal called "MyChart".  Sign up information is provided on this After Visit Summary.  MyChart is used to connect with patients for Virtual Visits (Telemedicine).  Patients are able to view lab/test results, encounter notes, upcoming appointments, etc.  Non-urgent messages can be sent to your provider as well.   To learn more about what you can do with MyChart, go to [email or web address].    Your next appointment:   1 year(s)  Provider:   Sherren Mocha, MD     Signed, [place], [place]  12/01/2022 12:27 PM    [place]

## 2022-12-01 ENCOUNTER — Encounter: Payer: Self-pay | Admitting: Internal Medicine

## 2022-12-01 ENCOUNTER — Encounter: Payer: Self-pay | Admitting: Physician Assistant

## 2022-12-01 ENCOUNTER — Ambulatory Visit: Payer: Medicare Other | Attending: Physician Assistant | Admitting: Physician Assistant

## 2022-12-01 ENCOUNTER — Ambulatory Visit (INDEPENDENT_AMBULATORY_CARE_PROVIDER_SITE_OTHER): Payer: Medicare Other | Admitting: Internal Medicine

## 2022-12-01 VITALS — BP 112/76 | HR 73 | Ht 68.0 in | Wt 221.4 lb

## 2022-12-01 VITALS — BP 108/68 | HR 59 | Ht 68.0 in | Wt 219.8 lb

## 2022-12-01 DIAGNOSIS — I10 Essential (primary) hypertension: Secondary | ICD-10-CM | POA: Insufficient documentation

## 2022-12-01 DIAGNOSIS — E785 Hyperlipidemia, unspecified: Secondary | ICD-10-CM

## 2022-12-01 DIAGNOSIS — I482 Chronic atrial fibrillation, unspecified: Secondary | ICD-10-CM | POA: Insufficient documentation

## 2022-12-01 DIAGNOSIS — Z01810 Encounter for preprocedural cardiovascular examination: Secondary | ICD-10-CM | POA: Diagnosis not present

## 2022-12-01 DIAGNOSIS — I2510 Atherosclerotic heart disease of native coronary artery without angina pectoris: Secondary | ICD-10-CM

## 2022-12-01 DIAGNOSIS — Z7901 Long term (current) use of anticoagulants: Secondary | ICD-10-CM | POA: Diagnosis not present

## 2022-12-01 DIAGNOSIS — E1151 Type 2 diabetes mellitus with diabetic peripheral angiopathy without gangrene: Secondary | ICD-10-CM

## 2022-12-01 DIAGNOSIS — E039 Hypothyroidism, unspecified: Secondary | ICD-10-CM

## 2022-12-01 DIAGNOSIS — I5042 Chronic combined systolic (congestive) and diastolic (congestive) heart failure: Secondary | ICD-10-CM | POA: Insufficient documentation

## 2022-12-01 LAB — POCT GLYCOSYLATED HEMOGLOBIN (HGB A1C): Hemoglobin A1C: 6 % — AB (ref 4.0–5.6)

## 2022-12-01 NOTE — Progress Notes (Signed)
Patient ID: [name], male   DOB: 1946-05-01, 77 y.o.   MRN: [id number]  HPI: [name] is a 77 y.o.-year-old male, returning for DM2, dx 2004, non-insulin-dependent, uncontrolled, with complications (CAD, s/p MI, s/p 2nd R toe amputation b/c osteomyelitis). Last visit 6 months ago. He has [facility] as supplemental and [employer] for meds in 10/2015.  Interim history: No increased urination, blurry vision, nausea, chest pain. He has good energy and stays busy.  Reviewed HbA1c levels: Lab Results  Component Value Date   HGBA1C 6.0 (A) 12/01/2022   HGBA1C 6.0 (A) 05/26/2022   HGBA1C 6.2 (A) 11/18/2021   HGBA1C 6.9 (A) 07/14/2021   HGBA1C 5.9 (H) 12/27/2020   HGBA1C 6.3 (A) 07/08/2020   HGBA1C 6.8 (H) 02/24/2020   HGBA1C 5.9 (A) 11/06/2019   HGBA1C 6.9 (H) 04/26/2019   HGBA1C 7.7 (A) 10/24/2018   HGBA1C 8.8 06/16/2017   HGBA1C 9.3 03/10/2017   HGBA1C 7.6 (H) 10/25/2015   HGBA1C 8.7 (H) 10/15/2014   HGBA1C 7.7 (H) 07/11/2014   HGBA1C 7.7 (H) 04/07/2014   HGBA1C 8.1 (H) 01/06/2014   HGBA1C 7.8 (H) 10/07/2013   HGBA1C 10.5 (H) 07/08/2013   HGBA1C 12.4 (H) 04/02/2013   02/27/2019: HbA1c calculated from fructosamine is better: 6.45%. 10/24/2018: HbA1c calculated from fructosamine is slightly higher than the one from 02/2018, at 6.6%. 02/13/2018: The HbA1c calculated from the fructosamine is excellent, at 6.3%! 10/16/2017: HbA1c calculated from fructosamine is: 7.3% (higher). 06/16/2017: HbA1c calculated from the fructosamine is stable, at 6.8%.  12/08/2016: HbA1c calculated from the fructosamine is better, at 6.86%. 08/10/2016: HbA1c calculated from  fructosamine is 7.88% 05/10/2016: HbA1c calculated from fructosamine is 7.6% 01/06/2016: HbA1c calculated from fructosamine is 6.6% 09/06/2016: HbA1c calculated from fructosamine is 7.0% 06/01/2015: HbA1c calculated from fructosamine is 7.0% 02/23/2015: HbA1c calculated from fructosamine is 6.88% 10/15/2014: HbA1c calculated  from fructosamine is 7.17% He started to change his eating habits since 03/2013.   He is on: - Metformin 1000 mg 2x a day >> 500 mg 2x a day  - Januvia 100 mg in am  - Jardiance 10 mg in am He does not miss doses. He was on glipizide 10 mg twice a day in the past but this was stopped during hospitalization in 12/2020.  Pt checks his sugars 4 times a day per his excellent log: - am: 112-136 >> 110-133, 137 >> 110-130, 136 >> 109-133, 135 >> 111-134 - 2h after breakfast: 138-160, 170 >> 144-163>> 142-162 >> 142-159 - before lunch 144 >> 110-120s >> n/c   - before dinner: 112-127 >> 89-111 >> 96-114 >> 101-133  >> 98-119 - 2h after dinner: 140-168, 173 >> 132-145, 164 >> 144-162 >> 134-157, 166 He has hypoglycemia awareness in the 60s. Highest: 287 >> .Marland Kitchen. 173 >> 163 >> 162 >> 166.  His wife is a Radiographer, therapeutic.  He saw [name] (nutritionist).  -+ Mild CKD, last BUN/creatinine:  Lab Results  Component Value Date   BUN 17 09/27/2022   CREATININE 1.29 09/27/2022  On Entresto.  -+ HL:  last set of lipids: Lab Results  Component Value Date   CHOL 98 03/03/2022   HDL 67.20 03/03/2022   LDLCALC 6 03/03/2022   LDLDIRECT 18.0 06/13/2017   TRIG 121.0 03/03/2022   CHOLHDL 1 03/03/2022  He was in the Reveal Study x 4 years >> stopped. On Lipitor 20 and omega-3 fatty acids.  - last eye exam was 06/09/2022: + DR reportedly.  Per previous reports, he  did not have macular edema, but he has cystoid macular edema OD -Dr. Zadie Rhine.  He has a history of cataract surgery.   - no numbness and tingling in his feet.  Last foot exam 04/13/2022.  He sees Dr. Prudence Davidson. He had toe osteomyelitis and had to have it amputated in 01/2022.  He is now seeing Dr. Prudence Davidson for onychomycosis and further foot care.  He is compliant with CPAP for OSA, also has A. Fib, IBD -ulcerative pancolitis, GERD, also, obesity. In 2019 she was also diagnosed with CHF -EF of 25 to 30%.  His Lasix was increased to twice a day(takes  the second dose as needed). Cardiac cath >> no significant obstruction.  He was started on Entresto. He was admitted 3/20-24/2022 for hypoxemic respiratory failure in the setting of CHF after not taking his Lasix for a few weeks due to increased urination.  At that time, he was unresponsive, had A. fib with RVR at that time and a glucose level at 260.  He had to be intubated.  He recovered well afterwards.  He has a history of very low vitamin B12, improved: Lab Results  Component Value Date   VITAMINB12 734 03/03/2022   VITAMINB12 1,256 (H) 09/02/2020   VITAMINB12 103 (L) 02/24/2020   VITAMINB12 267 05/01/2012   VITAMINB12 201 (L) 11/23/2010   He was started on B12 injections - per PCP.  Hypothyroidism:  Pt is on levothyroxine 50 mcg daily, taken: - in am - fasting - at least 30 min from b'fast - no Ca, Fe, MVI, PPIs - not on Biotin  Latest TSH was reviewed and this was normal: Lab Results  Component Value Date   TSH 1.02 09/27/2022   Pt denies: - feeling nodules in neck - hoarseness - dysphagia - choking  ROS: + See HPI  I reviewed pt's medications, allergies, PMH, social hx, family hx, and changes were documented in the history of present illness. Otherwise, unchanged from my initial visit note.  Past Medical History:  Diagnosis Date   B12 deficiency    per patient previously taking shots   C. difficile colitis 04/17/2019   C. difficile diarrhea    CAD (coronary artery disease), native coronary artery 04/12/2007   Chronic atrial fibrillation ([place])    initial diagnoses 2012   Chronic combined systolic and diastolic CHF (congestive heart failure) ([place]) 04/15/2019   CKD (chronic kidney disease), stage III ([place]) 04/15/2019   CORONARY ARTERY DISEASE 04/12/2007   2 stents last in 2006.    Cystoid macular edema of right eye 01/16/2020   Cystoid macular edema of right eye 01/16/2020   DIABETES MELLITUS, TYPE II 04/16/2007   Diverticulosis of colon (without mention of hemorrhage)     GERD (gastroesophageal reflux disease) 08/09/2011   Hyperlipidemia 04/16/2007   10/24 reveal study end. Atorvastatin 45m   HYPERTENSION 04/12/2007   HYPOTHYROIDISM 04/12/2007   Ischemic cardiomyopathy    Morbid obesity ([place] 08/09/2011   MYOCARDIAL INFARCTION, HX OF 04/12/2007   Obstructive sleep apnea 04/12/2007   02/2011 - AHI 96/h CPAP 13, Lg FF      Stroke ([facility])    Type II diabetes mellitus with peripheral circulatory disorder ([place] 04/16/2007   Dr. GCruzita Lederermanages. Uses fructosamine.     ULCERATIVE COLITIS, LEFT SIDED 11/23/2010   [id number]  Ulcerative Colitis. Mesalamine.    VITAMIN B12 DEFICIENCY 11/29/2010   Past Surgical History:  Procedure Laterality Date   CARDIAC CATHETERIZATION  10/2002  STENT. 2 stents Dr. Maurene Capes   CARDIAC CATHETERIZATION N/A 10/28/2015   Procedure: Right/Left Heart Cath and Coronary Angiography;  Surgeon: Sherren Mocha, MD;  Location: [place] [facility];  Service: Cardiovascular;  Laterality: N/A;   CATARACT EXTRACTION     2021 bilateral eyes    COLONOSCOPY  2012   Dr. Sharlett Iles; findings consistent with universal UC s/p random biopsies, no active bleeding, polyps, masses.  Pathology with mild chronic active colitis.  Repeat in 5 years.   CORONARY STENT PLACEMENT  2008   LAD    I & D EXTREMITY Right 01/27/2022   Procedure: RIGHT SECOND METARTSOPHALANGEAL JOINT DISARTICULATION, IRRIGATION AND DEBRIDEMENT OF ABSCESS RIGHT FOOT;  Surgeon: Erle Crocker, MD;  Location: [facility];  Service: Orthopedics;  Laterality: Right;   RIGHT/LEFT HEART CATH AND CORONARY ANGIOGRAPHY N/A 06/01/2018   Procedure: RIGHT/LEFT HEART CATH AND CORONARY ANGIOGRAPHY;  Surgeon: Sherren Mocha, MD;  Location: [facility] [facility];  Service: Cardiovascular;  Laterality: N/A;   TONSILLECTOMY     Social History   Socioeconomic History   Marital status: Married    Spouse name: Not on file   Number of children: 1   Years of education:  Not on file   Highest education level: Not on file  Occupational History   Occupation: RETIRED    Employer: RETIRED  Tobacco Use   Smoking status: Former    Packs/day: 1.00    Years: 5.00    Total pack years: 5.00    Types: Cigarettes    Quit date: 04/13/1966    Years since quitting: 56.6   Smokeless tobacco: Never  Vaping Use   Vaping Use: Never used  Substance and Sexual Activity   Alcohol use: No    Alcohol/week: 0.0 standard drinks of alcohol    Comment: no more beer   Drug use: No   Sexual activity: Not Currently  Other Topics Concern   Not on file  Social History Narrative   Cardiorehab 3 days a week. 45 minutes to an hour-stationary bike.    GRANDDAUGHTER (MS. 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       Married for 37 years in 2015, married previously for 14 years. Daughter with first wife and 3 grandkids.    Lives alone with wife. Get to see grandkids a lot. New grandchild in middle of 70      Hobbies-yardwork, previously liked to hunt and fish, does some target shooting      Tourist information centre manager for 6 yrs   Social Determinants of Health   Financial Resource Strain: Low Risk  (11/14/2022)   Overall Financial Resource Strain (CARDIA)    Difficulty of Paying Living Expenses: Not hard at all  Food Insecurity: No Food Insecurity (11/14/2022)   Hunger Vital Sign    Worried About Running Out of Food in the Last Year: Never true    Ran Out of Food in the Last Year: Never true  Transportation Needs: No Transportation Needs (11/14/2022)   PRAPARE - Hydrologist (Medical): No    Lack of Transportation (Non-Medical): No  Physical Activity: Inactive (11/14/2022)   Exercise Vital Sign    Days of Exercise per Week: 0 days    Minutes of Exercise per Session: 0 min  Stress: No Stress Concern Present (11/14/2022)   [employer] of Occupational  Health - Occupational Stress Questionnaire    Feeling of Stress : Not at all  Social Connections: Moderately Integrated (11/14/2022)   Social Connection and Isolation Panel [NHANES]    Frequency of Communication with Friends and Family: More than three times a week    Frequency of Social Gatherings with Friends and Family: More than three times a week    Attends Religious Services: More than 4 times per year    Active Member of [employer] or Organizations: No    Attends Archivist Meetings: Never    Marital Status: Married  Human resources officer Violence: Not At Risk (11/14/2022)   Humiliation, Afraid, Rape, and Kick questionnaire    Fear of Current or Ex-Partner: No    Emotionally Abused: No    Physically Abused: No    Sexually Abused: No   Current Outpatient Medications on File Prior to Visit  Medication Sig Dispense Refill   apixaban (ELIQUIS) 5 MG TABS tablet TAKE ONE TABLET (5MG TOTAL) BY MOUTH TWO TIMES DAILY (REPLACES PRADAXA) 180 tablet 1   atorvastatin (LIPITOR) 20 MG tablet TAKE ONE TABLET (20MG TOTAL) BY MOUTH DAILY 30 tablet 5   carvedilol (COREG) 25 MG tablet TAKE ONE TABLET BY MOUTH TWICE A DAY 60 tablet 6   clopidogrel (PLAVIX) 75 MG tablet TAKE ONE TABLET (75MG TOTAL) BY MOUTH DAILY 90 tablet 3   Clotrimazole 1 % OINT Apply to penis twice daily after saline bath/rinse and pat dry (Patient taking differently: Apply 1 application  topically daily as needed (Apply to penis  after saline bath/rinse and pat dry).) 56.7 g 0   Coenzyme Q10 (COQ10 PO) Take 1 capsule by mouth daily with supper.     cyanocobalamin 1000 MCG tablet Take 1,000 mcg by mouth daily.     dicyclomine (BENTYL) 10 MG capsule TAKE ONE CAPSULE (10MG TOTAL) BY MOUTH TWO TIMES DAILY AS NEEDED FOR DIARRHEA OR ABDOMINAL CRAMPING 90 capsule 3   fish oil-omega-3 fatty acids 1000 MG capsule Take 1 g by mouth daily with supper.     furosemide (LASIX) 40 MG tablet TAKE ONE TABLET (40MG TOTAL) BY MOUTH TWO TIMES DAILY 180  tablet 0   glucose blood test strip Use as instructed to test 4 time daily 400 each 3   isosorbide mononitrate (IMDUR) 60 MG 24 hr tablet TAKE ONE (1) TABLET BY MOUTH EVERY DAY 90 tablet 1   JARDIANCE 10 MG TABS tablet TAKE ONE (1) TABLET BY MOUTH EVERY DAY 90 tablet 3   mesalamine (APRISO) 0.375 g 24 hr capsule TAKE 4 CAPSULES (1.5 GRAMS TOTAL) BY MOUTH DAILY 120 capsule 11   metFORMIN (GLUCOPHAGE) 500 MG tablet TAKE ONE TABLET (500MG TOTAL) BY MOUTH TWO TIMES DAILY WITH A MEAL 180 tablet 3   saccharomyces boulardii (FLORASTOR) 250 MG capsule Take 1 capsule (250 mg total) by mouth 2 (two) times daily.     sacubitril-valsartan (ENTRESTO) 97-103 MG Take 1 tablet by mouth 2 (two) times daily. Please keep scheduled appointment 60 tablet 1   sitaGLIPtin (JANUVIA) 100 MG tablet TAKE ONE (1) TABLET BY MOUTH EVERY DAY 90 tablet 3   spironolactone (ALDACTONE) 25 MG tablet TAKE ONE (1) TABLET BY MOUTH EVERY DAY 90 tablet 3   SYNTHROID 50 MCG tablet TAKE ONE TABLET BY MOUTH DAILY BEFORE BREAKFAST 90 tablet 3   triamcinolone (KENALOG) 0.1 % Apply 1 application topically 2 (two) times daily. For 7-10 days maximum (Patient taking differently: Apply 1 application  topically daily as needed (Skin). For  7-10 days maximum) 80 g 0   No current facility-administered medications on file prior to visit.   Allergies  Allergen Reactions   Clindamycin/Lincomycin     Developed C. difficile colitis 1 month after use   Family History  Problem Relation Age of Onset   Heart attack Mother        mid 48s   Heart disease Father        H/O CAD, CABG, VALVE SURGERY   Hypertension Brother    Obesity Brother    Stomach cancer Maternal Aunt    Stomach cancer Paternal Grandmother        ? colon    Colon cancer Neg Hx    PE: BP 108/68 (BP Location: Right Arm, Patient Position: Sitting, Cuff Size: Normal)   Pulse (!) 59   Ht 5' 8"$  (1.727 m)   Wt 219 lb 12.8 oz (99.7 kg)   SpO2 98%   BMI 33.42 kg/m   Wt Readings  from Last 3 Encounters:  12/01/22 219 lb 12.8 oz (99.7 kg)  12/01/22 221 lb 6.4 oz (100.4 kg)  11/14/22 222 lb (100.7 kg)   Constitutional: overweight, in NAD Eyes: EOMI, no exophthalmos ENT: no thyromegaly, no cervical lymphadenopathy Cardiovascular: RRR, No MRG, + B periankle edema L>R Respiratory: CTA B Musculoskeletal: no deformities Skin: + stasis dermatitis rash BLE Neurological: no tremor with outstretched hands  ASSESSMENT: 1. DM2, non-insulin-dependent, previously uncontrolled, with complications - CAD, s/p MI 10/2006 - s/p stent - sees [name]  2. Obesity class 3 BMI Classification: < 18.5 underweight  18.5-24.9 normal weight  25.0-29.9 overweight  30.0-34.9 class I obesity  35.0-39.9 class II obesity  ? 40.0 class III obesity   3. HL  4.  Hypothyroidism  PLAN:  1. Patient with longstanding, previously uncontrolled, type 2 diabetes, but with significant improvement in control in the last 4 years. -We are using a lower dose of metformin for him after his C. difficile infection in 2020.  He was taken off sulfonylurea at discharge at that time and we did not have to restart it.  In the past, I did suggest to try to come off Januvia but he wanted to continue with it. At last visit, HbA1c was excellent, at 6.0%. -At today's visit, sugars are at goal, without significant hyperglycemic exceptions without lows.  Therefore, for now, I recommended to continue the current regimen. - I advised him to Patient Instructions  Please continue:  - Metformin 500 mg 2x a day - Januvia 100 mg before breakfast - Jardiance 10 mg before breakfast   Please return in 6 months with your sugar log.   - we checked his HbA1c: 6.0% (stable, at goal) - advised to check sugars at different times of the day - 1x a day, rotating check times - advised for yearly eye exams >> he is UTD - return to clinic in 6 months  2. HL -Reviewed latest lipid panel: At goal: Lab Results  Component  Value Date   CHOL 98 03/03/2022   HDL 67.20 03/03/2022   LDLCALC 6 03/03/2022   LDLDIRECT 18.0 06/13/2017   TRIG 121.0 03/03/2022   CHOLHDL 1 03/03/2022  -He continues on Lipitor 20 mg daily and omega-3 fatty acids, without side effects  3.  Hypothyroidism - latest thyroid labs reviewed with pt. >> normal: Lab Results  Component Value Date   TSH 1.02 09/27/2022  - he continues on LT4 50 mcg daily - pt feels good on this dose. -  we discussed about taking the thyroid hormone every day, with water, >30 minutes before breakfast, separated by >4 hours from acid reflux medications, calcium, iron, multivitamins. Pt. is taking it correctly.  Philemon Kingdom, MD PhD [facility] Endocrinology

## 2022-12-01 NOTE — Patient Instructions (Signed)
Medication Instructions:  No changes *If you need a refill on your cardiac medications before your next appointment, please call your pharmacy*   Lab Work: No Labs If you have labs (blood work) drawn today and your tests are completely normal, you will receive your results only by: [place] (if you have MyChart) OR A paper copy in the mail If you have any lab test that is abnormal or we need to change your treatment, we will call you to review the results.   Testing/Procedures: No Testing   Follow-Up: At [facility], you and your health needs are our priority.  As part of our continuing mission to provide you with exceptional heart care, we have created designated Provider Care Teams.  These Care Teams include your primary Cardiologist (physician) and Advanced Practice Providers (APPs -  Physician Assistants and Nurse Practitioners) who all work together to provide you with the care you need, when you need it.  We recommend signing up for the patient portal called "MyChart".  Sign up information is provided on this After Visit Summary.  MyChart is used to connect with patients for Virtual Visits (Telemedicine).  Patients are able to view lab/test results, encounter notes, upcoming appointments, etc.  Non-urgent messages can be sent to your provider as well.   To learn more about what you can do with MyChart, go to [email or web address].    Your next appointment:   1 year(s)  Provider:   Sherren Mocha, MD

## 2022-12-01 NOTE — Patient Instructions (Signed)
Please continue:  - Metformin 500 mg 2x a day - Januvia 100 mg before breakfast - Jardiance 10 mg before breakfast   Please return in 6 months with your sugar log.  

## 2022-12-30 ENCOUNTER — Other Ambulatory Visit: Payer: Self-pay | Admitting: Cardiovascular Disease

## 2022-12-30 MED ORDER — ENTRESTO 97-103 MG PO TABS: 1.0000 | ORAL_TABLET | Freq: Two times a day (BID) | ORAL | 11 refills | Status: DC

## 2023-01-17 ENCOUNTER — Other Ambulatory Visit: Payer: Self-pay | Admitting: Gastroenterology

## 2023-01-17 DIAGNOSIS — R197 Diarrhea, unspecified: Secondary | ICD-10-CM

## 2023-01-17 DIAGNOSIS — A0472 Enterocolitis due to Clostridium difficile, not specified as recurrent: Secondary | ICD-10-CM

## 2023-01-17 DIAGNOSIS — K5100 Ulcerative (chronic) pancolitis without complications: Secondary | ICD-10-CM

## 2023-01-17 NOTE — Telephone Encounter (Signed)
Phoned and advised the pt that his Rx was sent to his pharmacy. Pt expressed understanding

## 2023-01-23 ENCOUNTER — Other Ambulatory Visit: Payer: Self-pay | Admitting: Internal Medicine

## 2023-02-01 ENCOUNTER — Encounter: Payer: Self-pay | Admitting: Podiatry

## 2023-02-01 ENCOUNTER — Ambulatory Visit (INDEPENDENT_AMBULATORY_CARE_PROVIDER_SITE_OTHER): Payer: Medicare Other | Admitting: Podiatry

## 2023-02-01 DIAGNOSIS — M79674 Pain in right toe(s): Secondary | ICD-10-CM | POA: Diagnosis not present

## 2023-02-01 DIAGNOSIS — M79675 Pain in left toe(s): Secondary | ICD-10-CM

## 2023-02-01 DIAGNOSIS — S98131S Complete traumatic amputation of one right lesser toe, sequela: Secondary | ICD-10-CM

## 2023-02-01 DIAGNOSIS — B351 Tinea unguium: Secondary | ICD-10-CM

## 2023-02-01 DIAGNOSIS — E1151 Type 2 diabetes mellitus with diabetic peripheral angiopathy without gangrene: Secondary | ICD-10-CM

## 2023-02-01 NOTE — Progress Notes (Signed)
This patient returns to my office for at risk foot care.  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  Patient has amputation second toe right foot.  This patient is unable to cut nails himself since the patient cannot reach his nails.These nails are painful walking and wearing shoes.  This patient presents for at risk foot care today.  General Appearance  Alert, conversant and in no acute stress.  Vascular  Dorsalis pedis and posterior tibial  pulses are weakly  palpable  bilaterally.  Capillary return is within normal limits  bilaterally. Temperature is within normal limits  bilaterally.  Neurologic  Senn-Weinstein monofilament wire test within normal limits  bilaterally. Muscle power within normal limits bilaterally.  Nails Thick disfigured discolored nails with subungual debris  from hallux to fifth toes  left and 1,3-5 right.. No evidence of bacterial infection or drainage bilaterally.  Orthopedic  No limitations of motion  feet .  No crepitus or effusions noted.  No bony pathology or digital deformities noted.  Skin  normotropic skin with no porokeratosis noted bilaterally.  No signs of infections or ulcers noted.     Onychomycosis  Pain in right toes  Pain in left toes  Consent was obtained for treatment procedures.   Mechanical debridement of nails 1-5  bilaterally performed with a nail nipper.  Filed with dremel without incident.    Return office visit   3 months                   Told patient to return for periodic foot care and evaluation due to potential at risk complications.   Lakota Schweppe DPM   

## 2023-02-08 DIAGNOSIS — H26493 Other secondary cataract, bilateral: Secondary | ICD-10-CM | POA: Diagnosis not present

## 2023-02-08 DIAGNOSIS — Z961 Presence of intraocular lens: Secondary | ICD-10-CM | POA: Diagnosis not present

## 2023-02-08 DIAGNOSIS — E119 Type 2 diabetes mellitus without complications: Secondary | ICD-10-CM | POA: Diagnosis not present

## 2023-02-08 DIAGNOSIS — Z7984 Long term (current) use of oral hypoglycemic drugs: Secondary | ICD-10-CM | POA: Diagnosis not present

## 2023-02-08 LAB — HM DIABETES EYE EXAM

## 2023-02-09 ENCOUNTER — Encounter: Payer: Self-pay | Admitting: Internal Medicine

## 2023-02-17 ENCOUNTER — Other Ambulatory Visit: Payer: Self-pay | Admitting: Internal Medicine

## 2023-02-20 ENCOUNTER — Other Ambulatory Visit: Payer: Self-pay | Admitting: Internal Medicine

## 2023-02-20 DIAGNOSIS — E1151 Type 2 diabetes mellitus with diabetic peripheral angiopathy without gangrene: Secondary | ICD-10-CM

## 2023-02-28 ENCOUNTER — Other Ambulatory Visit (HOSPITAL_COMMUNITY): Payer: Self-pay | Admitting: Internal Medicine

## 2023-03-14 ENCOUNTER — Encounter (INDEPENDENT_AMBULATORY_CARE_PROVIDER_SITE_OTHER): Payer: Medicare Other | Admitting: Ophthalmology

## 2023-03-15 ENCOUNTER — Other Ambulatory Visit: Payer: Self-pay | Admitting: Internal Medicine

## 2023-03-15 ENCOUNTER — Encounter (INDEPENDENT_AMBULATORY_CARE_PROVIDER_SITE_OTHER): Payer: Medicare Other | Admitting: Ophthalmology

## 2023-03-15 DIAGNOSIS — H26493 Other secondary cataract, bilateral: Secondary | ICD-10-CM | POA: Diagnosis not present

## 2023-03-15 DIAGNOSIS — H43813 Vitreous degeneration, bilateral: Secondary | ICD-10-CM | POA: Diagnosis not present

## 2023-03-15 DIAGNOSIS — E113391 Type 2 diabetes mellitus with moderate nonproliferative diabetic retinopathy without macular edema, right eye: Secondary | ICD-10-CM | POA: Diagnosis not present

## 2023-03-15 DIAGNOSIS — E113312 Type 2 diabetes mellitus with moderate nonproliferative diabetic retinopathy with macular edema, left eye: Secondary | ICD-10-CM | POA: Diagnosis not present

## 2023-03-16 ENCOUNTER — Telehealth: Payer: Self-pay

## 2023-03-16 NOTE — Telephone Encounter (Signed)
    Primary Cardiologist: Tonny Bollman, MD  Chart reviewed as part of pre-operative protocol coverage. Simple dental extractions are considered low risk procedures per guidelines and generally do not require any specific cardiac clearance. It is also generally accepted that for simple extractions and dental cleanings, there is no need to interrupt blood thinner therapy.   SBE prophylaxis is not required for the patient.  I will route this recommendation to the requesting party via Epic fax function and remove from pre-op pool.  Please call with questions.  Sharlene Dory, PA-C 03/16/2023, 1:08 PM

## 2023-03-16 NOTE — Telephone Encounter (Signed)
   Pre-operative Risk Assessment    Patient Name: [name]  DOB: [birth year] MRN: [id number]     Request for Surgical Clearance    Procedure:  Dental Extraction - Amount of Teeth to be Pulled:  1  Surgical  Date of Surgery:  Clearance TBD                                 Surgeon:  Dr. Allen Norris Group or Practice Name:  [facility] Phone number:  [phone] Fax number:  [phone]   Type of Clearance Requested:   - Medical  - Pharmacy:  Hold Clopidogrel (Plavix) and Apixaban (Eliquis) pt will need instructions on when/if to hold; SBE needed?   Type of Anesthesia:   Unknown   Additional requests/questions:  Does this patient need antibiotics?  Signed, Zada Finders   03/16/2023, 8:42 AM

## 2023-03-22 DIAGNOSIS — H26492 Other secondary cataract, left eye: Secondary | ICD-10-CM | POA: Diagnosis not present

## 2023-03-24 ENCOUNTER — Other Ambulatory Visit: Payer: Self-pay | Admitting: Cardiovascular Disease

## 2023-03-29 ENCOUNTER — Ambulatory Visit: Payer: Medicare Other | Admitting: Family Medicine

## 2023-04-20 ENCOUNTER — Encounter: Payer: Self-pay | Admitting: Physician Assistant

## 2023-04-20 ENCOUNTER — Ambulatory Visit (INDEPENDENT_AMBULATORY_CARE_PROVIDER_SITE_OTHER): Payer: Medicare Other | Admitting: Physician Assistant

## 2023-04-20 VITALS — BP 108/62 | HR 76 | Ht 68.0 in | Wt 224.0 lb

## 2023-04-20 DIAGNOSIS — L03213 Periorbital cellulitis: Secondary | ICD-10-CM

## 2023-04-20 MED ORDER — AMOXICILLIN-POT CLAVULANATE 875-125 MG PO TABS: 1.0000 | ORAL_TABLET | Freq: Two times a day (BID) | ORAL | 0 refills | Status: DC

## 2023-04-20 NOTE — Patient Instructions (Signed)
It was great to see you!  Start Augmentin antibiotic  Get help right away if: You have new symptoms. Your symptoms get worse or do not get better with treatment. You have a fever. Your vision becomes blurry or gets worse in any way. Your eye looks like it is sticking out or bulging out (proptosis). You develop double vision. You have trouble moving your eyes or pain when moving your eyes You have a severe headache. You have neck stiffness or severe neck pain.  Take care,  Jarold Motto PA-C

## 2023-04-20 NOTE — Progress Notes (Signed)
[name] is a 77 y.o. male here for a new problem.  History of Present Illness:   Chief Complaint  Patient presents with   Conjunctivitis    Onset 04/17/2023 Left eye     Eye concern Complains of swelling around his left eye that began 2 or 3 days ago. Slight tenderness to eyelids Reports mild crust in his left eye when he wakes up. States he's never experienced a stye. Denies vision changes, eye pain, fever, chills, ear pain, or headaches No known sick contacts.   Past Medical History:  Diagnosis Date   B12 deficiency    per patient previously taking shots   C. difficile colitis 04/17/2019   C. difficile diarrhea    CAD (coronary artery disease), native coronary artery 04/12/2007   Chronic atrial fibrillation (HCC)    initial diagnoses 2012   Chronic combined systolic and diastolic CHF (congestive heart failure) (HCC) 04/15/2019   CKD (chronic kidney disease), stage III (HCC) 04/15/2019   CORONARY ARTERY DISEASE 04/12/2007   2 stents last in 2006.    Cystoid macular edema of right eye 01/16/2020   Cystoid macular edema of right eye 01/16/2020   DIABETES MELLITUS, TYPE II 04/16/2007   Diverticulosis of colon (without mention of hemorrhage)    GERD (gastroesophageal reflux disease) 08/09/2011   Hyperlipidemia 04/16/2007   10/24 reveal study end. Atorvastatin 20mg    HYPERTENSION 04/12/2007   HYPOTHYROIDISM 04/12/2007   Ischemic cardiomyopathy    Morbid obesity (HCC) 08/09/2011   MYOCARDIAL INFARCTION, HX OF 04/12/2007   Obstructive sleep apnea 04/12/2007   02/2011 - AHI 96/h CPAP 13, Lg FF      Stroke (HCC)    Type II diabetes mellitus with peripheral circulatory disorder (HCC) 04/16/2007   Dr. Elvera Lennox manages. Uses fructosamine.     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 Mesalamine.    VITAMIN B12 DEFICIENCY 11/29/2010     Social History   Tobacco Use   Smoking status: Former    Current  packs/day: 0.00    Average packs/day: 1 pack/day for 5.0 years (5.0 ttl pk-yrs)    Types: Cigarettes    Start date: 04/13/1961    Quit date: 04/13/1966    Years since quitting: 57.0   Smokeless tobacco: Never  Vaping Use   Vaping status: Never Used  Substance Use Topics   Alcohol use: No    Alcohol/week: 0.0 standard drinks of alcohol    Comment: no more beer   Drug use: No    Past Surgical History:  Procedure Laterality Date   CARDIAC CATHETERIZATION  10/2002   STENT. 2 stents Dr. Dickie La   CARDIAC CATHETERIZATION N/A 10/28/2015   Procedure: Right/Left Heart Cath and Coronary Angiography;  Surgeon: Tonny Bollman, MD;  Location: [facility] INVASIVE CV LAB;  Service: Cardiovascular;  Laterality: N/A;   CATARACT EXTRACTION     2021 bilateral eyes    COLONOSCOPY  2012   Dr. Jarold Motto; findings consistent with universal UC s/p random biopsies, no active bleeding, polyps, masses.  Pathology with mild chronic active colitis.  Repeat in 5 years.   CORONARY STENT PLACEMENT  2008   LAD    I & D EXTREMITY Right 01/27/2022   Procedure: RIGHT SECOND METARTSOPHALANGEAL JOINT DISARTICULATION, IRRIGATION AND DEBRIDEMENT OF ABSCESS RIGHT FOOT;  Surgeon: Terance Hart, MD;  Location: [facility] OR;  Service: Orthopedics;  Laterality: Right;   RIGHT/LEFT HEART CATH AND CORONARY ANGIOGRAPHY N/A 06/01/2018  Procedure: RIGHT/LEFT HEART CATH AND CORONARY ANGIOGRAPHY;  Surgeon: Tonny Bollman, MD;  Location: [facility] INVASIVE CV LAB;  Service: Cardiovascular;  Laterality: N/A;   TONSILLECTOMY      Family History  Problem Relation Age of Onset   Heart attack Mother        mid 14s   Heart disease Father        H/O CAD, CABG, VALVE SURGERY   Hypertension Brother    Obesity Brother    Stomach cancer Maternal Aunt    Stomach cancer Paternal Grandmother        ? colon    Colon cancer Neg Hx     Allergies  Allergen Reactions   Clindamycin/Lincomycin     Developed C. difficile colitis 1 month after use     Current Medications:   Current Outpatient Medications:    amoxicillin-clavulanate (AUGMENTIN) 875-125 MG tablet, Take 1 tablet by mouth 2 (two) times daily., Disp: 20 tablet, Rfl: 0   apixaban (ELIQUIS) 5 MG TABS tablet, TAKE ONE TABLET (5MG  TOTAL) BY MOUTH TWO TIMES DAILY (REPLACES PRADAXA), Disp: 180 tablet, Rfl: 1   atorvastatin (LIPITOR) 20 MG tablet, TAKE ONE TABLET (20MG  TOTAL) BY MOUTH DAILY, Disp: 30 tablet, Rfl: 5   carvedilol (COREG) 25 MG tablet, TAKE ONE TABLET BY MOUTH TWICE A DAY, Disp: 60 tablet, Rfl: 6   clopidogrel (PLAVIX) 75 MG tablet, TAKE ONE TABLET (75MG  TOTAL) BY MOUTH DAILY, Disp: 90 tablet, Rfl: 3   Clotrimazole 1 % OINT, Apply to penis twice daily after saline bath/rinse and pat dry (Patient taking differently: Apply 1 application  topically daily as needed (Apply to penis  after saline bath/rinse and pat dry).), Disp: 56.7 g, Rfl: 0   Coenzyme Q10 (COQ10 PO), Take 1 capsule by mouth daily with supper., Disp: , Rfl:    cyanocobalamin 1000 MCG tablet, Take 1,000 mcg by mouth daily., Disp: , Rfl:    dicyclomine (BENTYL) 10 MG capsule, TAKE ONE CAPSULE (10MG  TOTAL) BY MOUTH TWO TIMES DAILY AS NEEDED FOR DIARRHEA OR ABDOMINAL CRAMPING, Disp: 90 capsule, Rfl: 2   fish oil-omega-3 fatty acids 1000 MG capsule, Take 1 g by mouth daily with supper., Disp: , Rfl:    furosemide (LASIX) 40 MG tablet, TAKE ONE TABLET (40MG  TOTAL) BY MOUTH TWO TIMES DAILY, Disp: 180 tablet, Rfl: 0   glucose blood test strip, Use as instructed to test 4 time daily, Disp: 400 each, Rfl: 3   isosorbide mononitrate (IMDUR) 60 MG 24 hr tablet, TAKE ONE (1) TABLET BY MOUTH EVERY DAY, Disp: 90 tablet, Rfl: 1   JARDIANCE 10 MG TABS tablet, TAKE ONE (1) TABLET BY MOUTH EVERY DAY, Disp: 90 tablet, Rfl: 3   levothyroxine (SYNTHROID) 50 MCG tablet, TAKE ONE TABLET BY MOUTH DAILY BEFORE BREAKFAST, Disp: 90 tablet, Rfl: 0   mesalamine (APRISO) 0.375 g 24 hr capsule, TAKE 4 CAPSULES (1.5 GRAMS TOTAL) BY MOUTH  DAILY, Disp: 120 capsule, Rfl: 11   metFORMIN (GLUCOPHAGE) 500 MG tablet, TAKE ONE TABLET (500MG  TOTAL) BY MOUTH TWO TIMES DAILY WITH A MEAL, Disp: 180 tablet, Rfl: 3   saccharomyces boulardii (FLORASTOR) 250 MG capsule, Take 1 capsule (250 mg total) by mouth 2 (two) times daily., Disp:  , Rfl:    sacubitril-valsartan (ENTRESTO) 97-103 MG, Take 1 tablet by mouth 2 (two) times daily., Disp: 60 tablet, Rfl: 11   sitaGLIPtin (JANUVIA) 100 MG tablet, TAKE ONE (1) TABLET BY MOUTH EVERY DAY, Disp: 90 tablet, Rfl: 3   spironolactone (ALDACTONE)  25 MG tablet, TAKE ONE (1) TABLET BY MOUTH EVERY DAY, Disp: 90 tablet, Rfl: 3   triamcinolone (KENALOG) 0.1 %, Apply 1 application topically 2 (two) times daily. For 7-10 days maximum (Patient taking differently: Apply 1 application  topically daily as needed (Skin). For 7-10 days maximum), Disp: 80 g, Rfl: 0   Review of Systems:   Review of Systems  Eyes:  Positive for discharge (overnight production, wakes up with crust) and redness.       (+) Swollen eyelids (left)    Vitals:   Vitals:   04/20/23 1056  BP: 108/62  Pulse: 76  SpO2: 99%  Weight: 224 lb (101.6 kg)  Height: 5\' 8"  (1.727 m)     Body mass index is 34.06 kg/m.  Physical Exam:   Physical Exam Vitals and nursing note reviewed.  Constitutional:      General: He is not in acute distress.    Appearance: He is well-developed. He is not ill-appearing or toxic-appearing.  HENT:     Head: Normocephalic and atraumatic.     Right Ear: Tympanic membrane, ear canal and external ear normal. Tympanic membrane is not erythematous, retracted or bulging.     Left Ear: Tympanic membrane, ear canal and external ear normal. Tympanic membrane is not erythematous, retracted or bulging.     Nose: Nose normal.     Right Sinus: No maxillary sinus tenderness or frontal sinus tenderness.     Left Sinus: No maxillary sinus tenderness or frontal sinus tenderness.     Mouth/Throat:     Pharynx: Uvula  midline. No posterior oropharyngeal erythema.  Eyes:     General: Lids are normal.        Left eye: Hordeolum (upper inner aspect) present.    Extraocular Movements: Extraocular movements intact.     Right eye: Normal extraocular motion.     Left eye: Normal extraocular motion.     Conjunctiva/sclera:     Left eye: Left conjunctiva is injected.     Comments: Erythematous and swollen upper and lower right lid  Neck:     Trachea: Trachea normal.  Cardiovascular:     Rate and Rhythm: Normal rate and regular rhythm.     Heart sounds: Normal heart sounds, S1 normal and S2 normal.  Pulmonary:     Effort: Pulmonary effort is normal.     Breath sounds: Normal breath sounds. No decreased breath sounds, wheezing, rhonchi or rales.  Lymphadenopathy:     Cervical: No cervical adenopathy.  Skin:    General: Skin is warm and dry.  Neurological:     Mental Status: He is alert.  Psychiatric:        Speech: Speech normal.        Behavior: Behavior normal. Behavior is cooperative.       Assessment and Plan:   Preseptal cellulitis of right eye Concern for possible preseptal cellulitis No systemic symptom(s), vitals wnl Fortunately no eye pain or issues with ROM/vision Start oral Augmentin LOW threshold to reach out if any new/worsening/lack of improvement AVS provided with list of precautions   I,Emily Lagle,acting as a scribe for Jarold Motto, PA.,have documented all relevant documentation on the behalf of Jarold Motto, PA,as directed by  Jarold Motto, PA while in the presence of Jarold Motto, [place].  I, Jarold Motto, [place], have reviewed all documentation for this visit. The documentation on 04/20/23 for the exam, diagnosis, procedures, and orders are all accurate and complete.  Jarold Motto, PA-C

## 2023-04-25 ENCOUNTER — Other Ambulatory Visit: Payer: Self-pay | Admitting: Family Medicine

## 2023-04-25 DIAGNOSIS — E785 Hyperlipidemia, unspecified: Secondary | ICD-10-CM

## 2023-05-02 ENCOUNTER — Ambulatory Visit (INDEPENDENT_AMBULATORY_CARE_PROVIDER_SITE_OTHER): Payer: Medicare Other | Admitting: Family Medicine

## 2023-05-02 ENCOUNTER — Encounter: Payer: Self-pay | Admitting: Family Medicine

## 2023-05-02 VITALS — BP 100/60 | HR 64 | Temp 97.3°F | Ht 68.0 in | Wt 227.4 lb

## 2023-05-02 DIAGNOSIS — K5100 Ulcerative (chronic) pancolitis without complications: Secondary | ICD-10-CM

## 2023-05-02 DIAGNOSIS — I10 Essential (primary) hypertension: Secondary | ICD-10-CM | POA: Diagnosis not present

## 2023-05-02 DIAGNOSIS — I5022 Chronic systolic (congestive) heart failure: Secondary | ICD-10-CM

## 2023-05-02 DIAGNOSIS — Z7984 Long term (current) use of oral hypoglycemic drugs: Secondary | ICD-10-CM

## 2023-05-02 DIAGNOSIS — I4821 Permanent atrial fibrillation: Secondary | ICD-10-CM | POA: Diagnosis not present

## 2023-05-02 DIAGNOSIS — E1151 Type 2 diabetes mellitus with diabetic peripheral angiopathy without gangrene: Secondary | ICD-10-CM

## 2023-05-02 DIAGNOSIS — E039 Hypothyroidism, unspecified: Secondary | ICD-10-CM

## 2023-05-02 DIAGNOSIS — I25118 Atherosclerotic heart disease of native coronary artery with other forms of angina pectoris: Secondary | ICD-10-CM

## 2023-05-02 DIAGNOSIS — E538 Deficiency of other specified B group vitamins: Secondary | ICD-10-CM

## 2023-05-02 DIAGNOSIS — E785 Hyperlipidemia, unspecified: Secondary | ICD-10-CM

## 2023-05-02 NOTE — Progress Notes (Signed)
Phone [phone] In person visit   Subjective:   [name] is a 77 y.o. year old very pleasant male patient who presents for/with See problem oriented charting Chief Complaint  Patient presents with   Medical Management of Chronic Issues   Diabetes   Hypertension   spot over right eye    Pt c/o spot over right eye that he wants you to look at   Past Medical History-  Patient Active Problem List   Diagnosis Date Noted   History of transient ischemic attack (TIA) 10/24/2015    Priority: High   CHF (congestive heart failure), NYHA class II (HCC) 05/09/2014    Priority: High   Ulcerative pancolitis without complication (HCC) 08/09/2011    Priority: High   Atrial fibrillation (HCC) 12/08/2010    Priority: High   Type II diabetes mellitus with peripheral circulatory disorder (HCC) 04/16/2007    Priority: High   CAD (coronary artery disease), native coronary artery 04/12/2007    Priority: High   Hyperlipidemia 04/16/2007    Priority: Medium    Hypothyroidism 04/12/2007    Priority: Medium    Essential hypertension 04/12/2007    Priority: Medium    Obstructive sleep apnea 04/12/2007    Priority: Medium    Former smoker 08/11/2016    Priority: Low   MCI (mild cognitive impairment) 01/19/2016    Priority: Low   Primary osteoarthritis of left knee 02/09/2015    Priority: Low   Allergic rhinitis 06/25/2012    Priority: Low   GERD (gastroesophageal reflux disease) 08/09/2011    Priority: Low   Vitamin B12 deficiency 11/29/2010    Priority: Low   Posterior vitreous detachment of both eyes 06/09/2022   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 difficile colitis 04/17/2019   Chronic combined systolic and diastolic CHF (congestive heart failure) (HCC) 04/15/2019   Hypokalemia 04/15/2019    Medications- reviewed and updated Current Outpatient Medications  Medication Sig Dispense Refill   amoxicillin-clavulanate (AUGMENTIN) 875-125 MG tablet Take 1 tablet by mouth 2 (two) times daily. 20 tablet 0   apixaban (ELIQUIS) 5 MG TABS tablet TAKE ONE TABLET (5MG  TOTAL) BY MOUTH TWO TIMES DAILY (REPLACES PRADAXA) 180 tablet 1   atorvastatin (LIPITOR) 20 MG tablet TAKE ONE TABLET (20MG  TOTAL) BY MOUTH DAILY 30 tablet 5   carvedilol (COREG) 25 MG tablet TAKE ONE TABLET BY MOUTH TWICE A DAY 60 tablet 6   clopidogrel (PLAVIX) 75 MG tablet TAKE ONE TABLET (75MG  TOTAL) BY MOUTH DAILY 90 tablet 3   Clotrimazole 1 % OINT Apply to penis twice daily after saline bath/rinse and pat dry (Patient taking differently: Apply 1 application  topically daily as needed (Apply to penis  after saline bath/rinse and pat dry).) 56.7 g 0   Coenzyme Q10 (COQ10 PO) Take 1 capsule by mouth daily with supper.     cyanocobalamin 1000 MCG tablet Take 1,000 mcg by mouth daily.     dicyclomine (BENTYL) 10 MG capsule TAKE ONE CAPSULE (10MG  TOTAL) BY MOUTH TWO TIMES DAILY AS NEEDED FOR DIARRHEA OR ABDOMINAL CRAMPING 90 capsule 2   fish oil-omega-3 fatty acids 1000 MG capsule Take 1 g by mouth daily with supper.  furosemide (LASIX) 40 MG tablet TAKE ONE TABLET (40MG  TOTAL) BY MOUTH TWO TIMES DAILY 180 tablet 0   glucose blood test strip Use as instructed to test 4 time daily 400 each 3   isosorbide mononitrate (IMDUR) 60 MG 24 hr tablet TAKE ONE (1) TABLET BY MOUTH EVERY DAY 90 tablet 1   JARDIANCE 10 MG TABS tablet TAKE ONE (1) TABLET BY MOUTH EVERY DAY 90 tablet 3   levothyroxine (SYNTHROID) 50 MCG tablet TAKE ONE TABLET BY MOUTH DAILY BEFORE BREAKFAST 90 tablet 0   mesalamine (APRISO) 0.375 g 24 hr capsule TAKE 4 CAPSULES (1.5 GRAMS TOTAL) BY MOUTH  DAILY 120 capsule 11   metFORMIN (GLUCOPHAGE) 500 MG tablet TAKE ONE TABLET (500MG  TOTAL) BY MOUTH TWO TIMES DAILY WITH A MEAL 180 tablet 3   saccharomyces boulardii (FLORASTOR) 250 MG capsule Take 1 capsule (250 mg total) by mouth 2 (two) times daily.     sacubitril-valsartan (ENTRESTO) 97-103 MG Take 1 tablet by mouth 2 (two) times daily. 60 tablet 11   sitaGLIPtin (JANUVIA) 100 MG tablet TAKE ONE (1) TABLET BY MOUTH EVERY DAY 90 tablet 3   spironolactone (ALDACTONE) 25 MG tablet TAKE ONE (1) TABLET BY MOUTH EVERY DAY 90 tablet 3   triamcinolone (KENALOG) 0.1 % Apply 1 application topically 2 (two) times daily. For 7-10 days maximum (Patient taking differently: Apply 1 application  topically daily as needed (Skin). For 7-10 days maximum) 80 g 0   No current facility-administered medications for this visit.     Objective:  BP 100/60   Pulse 64   Temp (!) 97.3 F (36.3 C)   Ht 5\' 8"  (1.727 m)   Wt 227 lb 6.4 oz (103.1 kg)   SpO2 97%   BMI 34.58 kg/m  Gen: NAD, resting comfortably CV: RRR no murmurs rubs or gallops Lungs: CTAB no crackles, wheeze, rhonchi Ext: trace edema Skin: warm, dry    Assessment and Plan   # Prior preseptal cellulitis about 12 days ago- resolved on Augmentin from [place], [place] -does appear to still have stye over left eye- encouraged warm compresses- has Dr. Luciana Axe visit in September as well  #Wound over right eye- reports had a sting and picked at it a lot about a month ago- has started using neosporin and not picking at it and gradually shrinking in size- we discussed if fails to continue o improve to let us know and get into [facility] dermatology  #Congestive heart failure with ejection fraction of 25% during hospitalization 2019 later improved to 50-55% march 2022 S: Medication: Lasix 40 mg twice daily, Aldactone 25 mg, Entresto 97-103 mg, Jardiance 10 mg, carvedilol 25 mg BID - weight up 5 lbs- has been having more ham- plans to cut back to cut  down on sodium A/P: CHF noted and with slight excess fluid- encouraged continue current medications but cut down on sodium intake- I think honey baked ham intake lately has been a driver   #%CAD/atrial fibrillation/hyperlipidemia. Follows with Dr. Excell Seltzer of cardiology S: Compliant with atorvastatin 20mg -LDL has been at goal under 70  I have prescribed his Plavix-with his CAD history-he is compliant with this.  He is also on Eliquis for anticoagulation due to atrial fibrillation-reports compliance .  Patient reports cardiology wants him on both medications still.  Patient is  on carvedilol for rate control -also on imdur 60mg    -no chest pain or shortness of breath on the imdur Lab Results  Component Value Date   CHOL  98 03/03/2022   HDL 67.20 03/03/2022   LDLCALC 6 03/03/2022   LDLDIRECT 18.0 06/13/2017   TRIG 121.0 03/03/2022   CHOLHDL 1 03/03/2022   A/P: CAD asymptomatic on imdur- continue current medications  Lipids at goal  last year- update today- continue current medications  Atrial fibrillation- appropriately anticoagulated and rate controlled- continue current medicine  #hypertension S: Compliant with Coreg 25 mg twice a day, Imdur 60 mg, Entresto, spironolactone 25 mg, Lasix 40 mg A/P:  stable- continue current medicines     #% Hypothyroidism S: Compliant with levothyroxine  50 mcg-most recently filled by Dr. Elvera Lennox Lab Results  Component Value Date   TSH 1.02 09/27/2022  A/P:hopefully stable- update tsh today. Continue current meds for now   % Ulcerative colitis S: Followed by GI- compliant with mesalamine with provider in [place] A/P:  reports stability lately- continue current medications    #Diabetes-followed by Dr. Elvera Lennox #Morbid obesity S:medication: on metformin 500mg  BID and jardiance 10 mg and januvia 100mg   Lab Results  Component Value Date   HGBA1C 6.0 (A) 12/01/2022   HGBA1C 6.0 (A) 05/26/2022   HGBA1C 6.2 (A) 11/18/2021  A/P: stable- continue  current medicines and endocrine follow up- he wants to do Point of Care (POC) a1c next month with Dr. Elvera Lennox   # Low b12- on sublingual b12- update levels- prior had been low but better last year Lab Results  Component Value Date   VITAMINB12 734 03/03/2022   Recommended follow up: Return in about 6 months (around 11/02/2023) for followup or sooner if needed.Schedule b4 you leave. Future Appointments  Date Time Provider Department Center  05/29/2023  3:00 PM Helane Gunther, DPM [facility]  06/08/2023  4:00 PM Carlus Pavlov, MD [facility] None  11/23/2023  8:45 AM [facility] ANNUAL WELLNESS VISIT 1 [facility]   Lab/Order associations:   ICD-10-CM   1. Type II diabetes mellitus with peripheral circulatory disorder (HCC)  E11.51 Comprehensive metabolic panel    CBC with Differential/Platelet    Lipid panel    2. Permanent atrial fibrillation (HCC)  I48.21     3. Chronic systolic congestive heart failure, NYHA class 2 (HCC)  I50.22     4. Vitamin B12 deficiency  E53.8 Vitamin B12    5. 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 Atherosclerotic heart disease of native coronary artery with other forms of angina pectoris (HCC) Chronic I25.118     7. Essential hypertension  I10     8. Hyperlipidemia, unspecified hyperlipidemia type  E78.5 Comprehensive metabolic panel    CBC with Differential/Platelet    Lipid panel    9. Hypothyroidism, unspecified type  E03.9 TSH     No orders of the defined types were placed in this encounter.  Return precautions advised.  Tana Conch, MD

## 2023-05-02 NOTE — Patient Instructions (Addendum)
Let us know If you get your [facility] or COVID vaccines this fall.  Thanks for doing labs If you have mychart- we will send your results within 3 business days of [place] receiving them.  If you do not have mychart- we will call you about results within 5 business days of [place] receiving them.  *please also note that you will see labs on mychart as soon as they post. I will later go in and write notes on them- will say "notes from Dr. Durene Cal"   Recommended follow up: Return in about 6 months (around 11/02/2023) for followup or sooner if needed.Schedule b4 you leave.

## 2023-05-03 LAB — COMPREHENSIVE METABOLIC PANEL
ALT: 15 U/L (ref 0–53)
AST: 21 U/L (ref 0–37)
Albumin: 4.3 g/dL (ref 3.5–5.2)
Alkaline Phosphatase: 59 U/L (ref 39–117)
BUN: 20 mg/dL (ref 6–23)
CO2: 25 mEq/L (ref 19–32)
Calcium: 9.3 mg/dL (ref 8.4–10.5)
Chloride: 103 mEq/L (ref 96–112)
Creatinine, Ser: 1.33 mg/dL (ref 0.40–1.50)
GFR: 51.64 mL/min — ABNORMAL LOW (ref >60.00)
Glucose, Bld: 101 mg/dL — ABNORMAL HIGH (ref 70–99)
Potassium: 4.3 mEq/L (ref 3.5–5.1)
Sodium: 138 mEq/L (ref 135–145)
Total Bilirubin: 0.7 mg/dL (ref 0.2–1.2)
Total Protein: 6.8 g/dL (ref 6.0–8.3)

## 2023-05-03 LAB — LIPID PANEL
Cholesterol: 90 mg/dL (ref 0–200)
HDL: 61 mg/dL (ref >39.00)
LDL Cholesterol: 12 mg/dL (ref 0–99)
NonHDL: 28.58
Total CHOL/HDL Ratio: 1
Triglycerides: 85 mg/dL (ref 0.0–149.0)
VLDL: 17 mg/dL (ref 0.0–40.0)

## 2023-05-03 LAB — CBC WITH DIFFERENTIAL/PLATELET
Basophils Absolute: 0 10*3/uL (ref 0.0–0.1)
Basophils Relative: 0.5 % (ref 0.0–3.0)
Eosinophils Absolute: 0.1 10*3/uL (ref 0.0–0.7)
Eosinophils Relative: 1.4 % (ref 0.0–5.0)
HCT: 47.3 % (ref 39.0–52.0)
Hemoglobin: 15.8 g/dL (ref 13.0–17.0)
Lymphocytes Relative: 13.9 % (ref 12.0–46.0)
Lymphs Abs: 1.2 10*3/uL (ref 0.7–4.0)
MCHC: 33.4 g/dL (ref 30.0–36.0)
MCV: 90.3 fl (ref 78.0–100.0)
Monocytes Absolute: 0.5 10*3/uL (ref 0.1–1.0)
Monocytes Relative: 6.4 % (ref 3.0–12.0)
Neutro Abs: 6.6 10*3/uL (ref 1.4–7.7)
Neutrophils Relative %: 77.8 % — ABNORMAL HIGH (ref 43.0–77.0)
Platelets: 139 10*3/uL — ABNORMAL LOW (ref 150.0–400.0)
RBC: 5.24 Mil/uL (ref 4.22–5.81)
RDW: 14.7 % (ref 11.5–15.5)
WBC: 8.5 10*3/uL (ref 4.0–10.5)

## 2023-05-03 LAB — VITAMIN B12: Vitamin B-12: 758 pg/mL (ref 211–911)

## 2023-05-03 LAB — TSH: TSH: 1.55 u[IU]/mL (ref 0.35–5.50)

## 2023-05-13 ENCOUNTER — Other Ambulatory Visit: Payer: Self-pay | Admitting: Family Medicine

## 2023-05-13 ENCOUNTER — Other Ambulatory Visit: Payer: Self-pay | Admitting: Cardiovascular Disease

## 2023-05-15 NOTE — Telephone Encounter (Signed)
Prescription refill request for Eliquis received. Indication:afib Last office visit:2/24 Scr:1.33  7/24 Age: 77 Weight:103.1  kg  Prescription refilled

## 2023-05-25 ENCOUNTER — Other Ambulatory Visit: Payer: Self-pay | Admitting: Gastroenterology

## 2023-05-25 DIAGNOSIS — K5100 Ulcerative (chronic) pancolitis without complications: Secondary | ICD-10-CM

## 2023-05-25 DIAGNOSIS — R197 Diarrhea, unspecified: Secondary | ICD-10-CM

## 2023-05-25 DIAGNOSIS — A0472 Enterocolitis due to Clostridium difficile, not specified as recurrent: Secondary | ICD-10-CM

## 2023-05-29 ENCOUNTER — Ambulatory Visit (INDEPENDENT_AMBULATORY_CARE_PROVIDER_SITE_OTHER): Payer: Medicare Other | Admitting: Podiatry

## 2023-05-29 ENCOUNTER — Encounter: Payer: Self-pay | Admitting: Podiatry

## 2023-05-29 ENCOUNTER — Encounter: Payer: Self-pay | Admitting: Family Medicine

## 2023-05-29 DIAGNOSIS — M79675 Pain in left toe(s): Secondary | ICD-10-CM

## 2023-05-29 DIAGNOSIS — B351 Tinea unguium: Secondary | ICD-10-CM | POA: Diagnosis not present

## 2023-05-29 DIAGNOSIS — M79674 Pain in right toe(s): Secondary | ICD-10-CM

## 2023-05-29 DIAGNOSIS — E1151 Type 2 diabetes mellitus with diabetic peripheral angiopathy without gangrene: Secondary | ICD-10-CM

## 2023-05-29 DIAGNOSIS — S98131S Complete traumatic amputation of one right lesser toe, sequela: Secondary | ICD-10-CM

## 2023-05-29 NOTE — Progress Notes (Signed)
This patient returns to my office for at risk foot care.  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  Patient has amputation second toe right foot.  This patient is unable to cut nails himself since the patient cannot reach his nails.These nails are painful walking and wearing shoes.  This patient presents for at risk foot care today.  General Appearance  Alert, conversant and in no acute stress.  Vascular  Dorsalis pedis and posterior tibial  pulses are weakly  palpable  bilaterally.  Capillary return is within normal limits  bilaterally. Temperature is within normal limits  bilaterally..  Venous stasis  B/L.  Neurologic  Senn-Weinstein monofilament wire test within normal limits  bilaterally. Muscle power within normal limits bilaterally.  Nails Thick disfigured discolored nails with subungual debris  from hallux to fifth toes  left and 1,3-5 right.. No evidence of bacterial infection or drainage bilaterally.  Orthopedic  No limitations of motion  feet .  No crepitus or effusions noted.  No bony pathology or digital deformities noted. Amputation second toe right foot.  Skin  normotropic skin with no porokeratosis noted bilaterally.  No signs of infections or ulcers noted.   Draining lesion on left lower leg above lateral malleolus.  Onychomycosis  Pain in right toes  Pain in left toes  Consent was obtained for treatment procedures.   Mechanical debridement of nails 1-5  bilaterally performed with a nail nipper.  Filed with dremel without incident. Skin lesion was bandaged with neosporin/DSD.  Told him to see his medical doctor.   Return office visit   3 months                   Told patient to return for periodic foot care and evaluation due to potential at risk complications.   Helane Gunther DPM

## 2023-05-30 NOTE — Telephone Encounter (Signed)
PCP doesn't have anything until mid-September. Can this be with someone else or did you want to work in?

## 2023-06-01 ENCOUNTER — Other Ambulatory Visit (HOSPITAL_COMMUNITY): Payer: Self-pay | Admitting: Internal Medicine

## 2023-06-07 ENCOUNTER — Encounter: Payer: Self-pay | Admitting: Family Medicine

## 2023-06-07 ENCOUNTER — Ambulatory Visit (INDEPENDENT_AMBULATORY_CARE_PROVIDER_SITE_OTHER): Payer: Medicare Other | Admitting: Family Medicine

## 2023-06-07 VITALS — BP 100/68 | HR 68 | Temp 97.8°F | Ht 68.0 in | Wt 225.8 lb

## 2023-06-07 DIAGNOSIS — R21 Rash and other nonspecific skin eruption: Secondary | ICD-10-CM

## 2023-06-07 DIAGNOSIS — I5022 Chronic systolic (congestive) heart failure: Secondary | ICD-10-CM

## 2023-06-07 DIAGNOSIS — I10 Essential (primary) hypertension: Secondary | ICD-10-CM | POA: Diagnosis not present

## 2023-06-07 MED ORDER — MUPIROCIN 2 % EX OINT: 1.0000 | TOPICAL_OINTMENT | Freq: Two times a day (BID) | CUTANEOUS | 0 refills | Status: DC

## 2023-06-07 NOTE — Progress Notes (Signed)
Phone [phone] In person visit   Subjective:   [name] is a 77 y.o. year old very pleasant male patient who presents for/with See problem oriented charting Chief Complaint  Patient presents with   spot on left ankle    Pt c/o spot on left ankle that's been there a couple of weeks, denies pain/itching/burning.   Past Medical History-  Patient Active Problem List   Diagnosis Date Noted   History of transient ischemic attack (TIA) 10/24/2015    Priority: High   CHF (congestive heart failure), NYHA class II (HCC) 05/09/2014    Priority: High   Ulcerative pancolitis without complication (HCC) 08/09/2011    Priority: High   Atrial fibrillation (HCC) 12/08/2010    Priority: High   Type II diabetes mellitus with peripheral circulatory disorder (HCC) 04/16/2007    Priority: High   CAD (coronary artery disease), native coronary artery 04/12/2007    Priority: High   Hyperlipidemia 04/16/2007    Priority: Medium    Hypothyroidism 04/12/2007    Priority: Medium    Essential hypertension 04/12/2007    Priority: Medium    Obstructive sleep apnea 04/12/2007    Priority: Medium    Former smoker 08/11/2016    Priority: Low   MCI (mild cognitive impairment) 01/19/2016    Priority: Low   Primary osteoarthritis of left knee 02/09/2015    Priority: Low   Allergic rhinitis 06/25/2012    Priority: Low   GERD (gastroesophageal reflux disease) 08/09/2011    Priority: Low   Vitamin B12 deficiency 11/29/2010    Priority: Low   Posterior vitreous detachment of both eyes 06/09/2022   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 difficile  colitis 04/17/2019   Chronic combined systolic and diastolic CHF (congestive heart failure) (HCC) 04/15/2019   Hypokalemia 04/15/2019    Medications- reviewed and updated Current Outpatient Medications  Medication Sig Dispense Refill   atorvastatin (LIPITOR) 20 MG tablet TAKE ONE TABLET (20MG  TOTAL) BY MOUTH DAILY 30 tablet 5   carvedilol (COREG) 25 MG tablet TAKE ONE TABLET BY MOUTH TWICE A DAY 60 tablet 6   clopidogrel (PLAVIX) 75 MG tablet TAKE ONE TABLET (75MG  TOTAL) BY MOUTH DAILY 90 tablet 3   Clotrimazole 1 % OINT Apply to penis twice daily after saline bath/rinse and pat dry (Patient taking differently: Apply 1 application  topically daily as needed (Apply to penis  after saline bath/rinse and pat dry).) 56.7 g 0   Coenzyme Q10 (COQ10 PO) Take 1 capsule by mouth daily with supper.     cyanocobalamin 1000 MCG tablet Take 1,000 mcg by mouth daily.     dicyclomine (BENTYL) 10 MG capsule TAKE ONE CAPSULE (10MG  TOTAL) BY MOUTH TWO TIMES DAILY AS NEEDED FOR DIARRHEA OR ABDOMINAL CRAMPING 90 capsule 1   ELIQUIS 5 MG TABS tablet TAKE ONE TABLET (5MG  TOTAL) BY MOUTH TWO TIMES DAILY (REPLACES PRADAXA) 180 tablet 1   fish oil-omega-3 fatty acids 1000 MG capsule Take 1 g by mouth daily with supper.     furosemide (LASIX) 40 MG tablet Take 1 tablet (40 mg total) by mouth 2 (two) times daily. NEEDS FOLLOW UP APPOINTMENT FOR MORE REFILLS 60 tablet 0  glucose blood test strip Use as instructed to test 4 time daily 400 each 3   isosorbide mononitrate (IMDUR) 60 MG 24 hr tablet TAKE ONE (1) TABLET BY MOUTH EVERY DAY 90 tablet 1   JARDIANCE 10 MG TABS tablet TAKE ONE (1) TABLET BY MOUTH EVERY DAY 90 tablet 3   levothyroxine (SYNTHROID) 50 MCG tablet TAKE ONE TABLET BY MOUTH DAILY BEFORE BREAKFAST 90 tablet 0   mesalamine (APRISO) 0.375 g 24 hr capsule TAKE 4 CAPSULES (1.5 GRAMS TOTAL) BY MOUTH DAILY 120 capsule 11   metFORMIN (GLUCOPHAGE) 500 MG tablet TAKE ONE TABLET (500MG  TOTAL) BY MOUTH TWO TIMES  DAILY WITH A MEAL 180 tablet 3   mupirocin ointment (BACTROBAN) 2 % Apply 1 Application topically 2 (two) times daily. Up to 2 weeks 30 g 0   saccharomyces boulardii (FLORASTOR) 250 MG capsule Take 1 capsule (250 mg total) by mouth 2 (two) times daily.     sacubitril-valsartan (ENTRESTO) 97-103 MG Take 1 tablet by mouth 2 (two) times daily. 60 tablet 11   sitaGLIPtin (JANUVIA) 100 MG tablet TAKE ONE (1) TABLET BY MOUTH EVERY DAY 90 tablet 3   spironolactone (ALDACTONE) 25 MG tablet TAKE ONE (1) TABLET BY MOUTH EVERY DAY 90 tablet 3   No current facility-administered medications for this visit.     Objective:  BP 100/68   Pulse 68   Temp 97.8 F (36.6 C)   Ht 5\' 8"  (1.727 m)   Wt 225 lb 12.8 oz (102.4 kg)   SpO2 97%   BMI 34.33 kg/m  Gen: NAD, resting comfortably CV: RRR no murmurs rubs or gallops Lungs: CTAB no crackles, wheeze, rhonchi Ext: trace edema with venous stasis skin changes   1st picture is medial portion of left lower leg- almost bandlike almost purplish discoloration with mild erythema surrounding it- no warmth or tendereness  This is lateral portion of left lower leg- patient reports at least anterior portion of this was from hitting leg on tractor- he's not sure about posterior position- in total area of redness or abrasion seems to span almost 3/4 of his ankle  2+ DP and PT pulses bilaterally     Assessment and Plan   #Rash S: Has noted a change to his left ankle on the skin-present for a couple weeks.  No pain/itching/burning. No trauma to area- other than lateral side tracktor did hit but he states is not the full area of redness* -has not used old triamcinolone or other steroids -was told had good bloodflow in legs by Dr. Susa Simmonds who did I+D on foot in 2023 -Saw Dr. Stacie Acres on 05/29/23 with podiatry who mentioned having him being seen by [place] for spot on left ank.  ROS-not ill appearing, no fever/chills. No new medications(Augmentin short term in mid July - no  relation to this). Not immunocompromised. No mucus membrane involvement.   A/P:new onset rash almost 3/4 semicircle near the ankle with mild surrrounding erythema without warmth or tenderness. I am not sure 100% what this is- atypical in several ways and does not have red flags at present (though we reviewed some of those in ros and discussed reasons for return). He did later in visit report that he had hit lateral side of ankle on tractor (but did not initially present this when asked so its possible he did have trauma throughout the area and not just on lateral portion) - in the end we opted to try mupirocin topical antibiotic to see if that helps- in  case this is bacterial but I do not have strong suspicion -update me in 1-2 weeks if not improving and we can try ketoconazole antifungal but honestly I am not overly confident that will work -may need dermatology visit and even biopsy of area -glad your pulses are good (low concern for peripheral arterial disease even with known CAD) - could try compression stocking but concerned with scabbed over area from where you hit the tractor you may reopen that with trying to pull those on   #Congestive heart failure with ejection fraction of 25% during hospitalization 2019 later improved to 50-55% march 2022 S: Medication: Lasix 40 mg twice daily, Aldactone 25 mg, Entresto 97-103 mg, Jardiance 10 mg, carvedilol 25 mg BID  A/P: only with trace edema on exam. Reports has cut down on salt intake and weight down 2 lbs. I do not think edema in leg is CHF related- though with good pulses could also try compression stockings as does have venous stasis skin changes. Appears euvolemic- continue current medications    #hypertension S: Compliant with Coreg 25 mg twice a day, Imdur 60 mg, Entresto, spironolactone 25 mg, Lasix 40 mg twice daily  A/P:  stable- continue current medicines     - wants to wait on labs for January visit   Recommended follow up: Return for  as needed for new, worsening, persistent symptoms. Future Appointments  Date Time Provider Department Center  06/08/2023  4:00 PM Carlus Pavlov, MD [facility] None  08/30/2023  3:45 PM Helane Gunther, DPM [facility]  11/09/2023  3:00 PM Shelva Majestic, MD [facility]  11/23/2023  8:45 AM [facility] ANNUAL WELLNESS VISIT 1 [facility]    Lab/Order associations:   ICD-10-CM   1. Rash  R21     2. Essential hypertension  I10     3. Chronic systolic congestive heart failure, NYHA class 2 (HCC)  I50.22       Meds ordered this encounter  Medications   mupirocin ointment (BACTROBAN) 2 %    Sig: Apply 1 Application topically 2 (two) times daily. Up to 2 weeks    Dispense:  30 g    Refill:  0    Return precautions advised.  Tana Conch, MD

## 2023-06-07 NOTE — Patient Instructions (Addendum)
Let us know when you get your flu shot or COVID vaccine at the pharmacy.  I am not sure 100% what this is- atypical in several ways - in the end we opted to try mupirocin topical antibiotic to see if that helps -update me in 1-2 weeks if not improving and we can try ketoconazole antifungal but honestly I am not overly confident that will work -may need dermatology visit and even biopsy of area -glad your pulses are good - could try compression stocking but concerned with scabbed over area from where you hit the tractor you may reopen that with trying to pull those on   Recommended follow up: Return for as needed for new, worsening, persistent symptoms.

## 2023-06-08 ENCOUNTER — Ambulatory Visit (INDEPENDENT_AMBULATORY_CARE_PROVIDER_SITE_OTHER): Payer: Medicare Other | Admitting: Internal Medicine

## 2023-06-08 ENCOUNTER — Encounter: Payer: Self-pay | Admitting: Internal Medicine

## 2023-06-08 VITALS — BP 126/70 | HR 64 | Ht 68.0 in | Wt 225.2 lb

## 2023-06-08 DIAGNOSIS — Z7984 Long term (current) use of oral hypoglycemic drugs: Secondary | ICD-10-CM

## 2023-06-08 DIAGNOSIS — E1151 Type 2 diabetes mellitus with diabetic peripheral angiopathy without gangrene: Secondary | ICD-10-CM

## 2023-06-08 DIAGNOSIS — E039 Hypothyroidism, unspecified: Secondary | ICD-10-CM | POA: Diagnosis not present

## 2023-06-08 DIAGNOSIS — E785 Hyperlipidemia, unspecified: Secondary | ICD-10-CM

## 2023-06-08 LAB — POCT GLYCOSYLATED HEMOGLOBIN (HGB A1C): Hemoglobin A1C: 6.5 % — AB (ref 4.0–5.6)

## 2023-06-08 MED ORDER — LEVOTHYROXINE SODIUM 50 MCG PO TABS: 50.0000 ug | ORAL_TABLET | Freq: Every day | ORAL | 3 refills | Status: DC

## 2023-06-08 NOTE — Patient Instructions (Signed)
Please continue:  - Metformin 500 mg 2x a day - Januvia 100 mg before breakfast - Jardiance 10 mg before breakfast   Please return in 6 months with your sugar log.  

## 2023-06-08 NOTE — Progress Notes (Signed)
Patient ID: [name], male   DOB: 15-Jun-1946, 77 y.o.   MRN: [id number]  HPI: [name] is a 77 y.o.-year-old male, returning for DM2, dx 2004, non-insulin-dependent, uncontrolled, with complications (CAD, s/p MI, s/p 2nd R toe amputation b/c osteomyelitis). Last visit 6 months ago. He has [facility] as supplemental and [employer] [facility] for meds in 10/2015.  Interim history: No increased urination, blurry vision, nausea, chest pain. He has good energy and stays busy.  Reviewed HbA1c levels: Lab Results  Component Value Date   HGBA1C 6.0 (A) 12/01/2022   HGBA1C 6.0 (A) 05/26/2022   HGBA1C 6.2 (A) 11/18/2021   HGBA1C 6.9 (A) 07/14/2021   HGBA1C 5.9 (H) 12/27/2020   HGBA1C 6.3 (A) 07/08/2020   HGBA1C 6.8 (H) 02/24/2020   HGBA1C 5.9 (A) 11/06/2019   HGBA1C 6.9 (H) 04/26/2019   HGBA1C 7.7 (A) 10/24/2018   HGBA1C 8.8 06/16/2017   HGBA1C 9.3 03/10/2017   HGBA1C 7.6 (H) 10/25/2015   HGBA1C 8.7 (H) 10/15/2014   HGBA1C 7.7 (H) 07/11/2014   HGBA1C 7.7 (H) 04/07/2014   HGBA1C 8.1 (H) 01/06/2014   HGBA1C 7.8 (H) 10/07/2013   HGBA1C 10.5 (H) 07/08/2013   HGBA1C 12.4 (H) 04/02/2013   02/27/2019: HbA1c calculated from fructosamine is better: 6.45%. 10/24/2018: HbA1c calculated from fructosamine is slightly higher than the one from 02/2018, at 6.6%. 02/13/2018: The HbA1c calculated from the fructosamine is excellent, at 6.3%! 10/16/2017: HbA1c calculated from fructosamine is: 7.3% (higher). 06/16/2017: HbA1c calculated from the fructosamine is stable, at 6.8%.  12/08/2016: HbA1c calculated from the fructosamine is better, at 6.86%. 08/10/2016: HbA1c calculated from  fructosamine is 7.88% 05/10/2016: HbA1c calculated from fructosamine is 7.6% 01/06/2016: HbA1c calculated from fructosamine is 6.6% 09/06/2016: HbA1c calculated from fructosamine is 7.0% 06/01/2015: HbA1c calculated from fructosamine is 7.0% 02/23/2015: HbA1c calculated from fructosamine is 6.88% 10/15/2014: HbA1c calculated  from fructosamine is 7.17% He started to change his eating habits since 03/2013.   He is on: - Metformin 1000 mg 2x a day >> 500 mg 2x a day  - Januvia 100 mg in am  - Jardiance 10 mg in am He does not miss doses. He was on glipizide 10 mg twice a day in the past but this was stopped during hospitalization in 12/2020.  Pt checks his sugars 4 times a day per his excellent log: - am: 1110-130, 136 >> 109-133, 135 >> 111-134 >> 125-136 - 2h after breakfast: 144-163 >> 142-162 >> 142-159 >> 153-162 - before lunch 144 >> 110-120s >> n/c   - before dinner: 96-114 >> 101-133  >> 98-119 >> 99-116 - 2h after dinner: 132-145, 164 >> 144-162 >> 134-157, 166 >> 145-154 He has hypoglycemia awareness in the 60s. Highest: 287 >> ... 162 >> 166 >> 132  His wife is a Research scientist (life sciences).  He saw Norm Salt (nutritionist).  -+ Mild CKD, last BUN/creatinine:  Lab Results  Component Value Date   BUN 20 05/02/2023   CREATININE 1.33 05/02/2023   Lab Results  Component Value Date   MICRALBCREAT 3.8 09/27/2022   MICRALBCREAT 2.4 02/27/2019   MICRALBCREAT 3.8 04/02/2013  On Entresto.  -+ HL:  last set of lipids: Lab Results  Component Value Date   CHOL 90 05/02/2023   HDL 61.00 05/02/2023   LDLCALC 12 05/02/2023   LDLDIRECT 18.0 06/13/2017   TRIG 85.0 05/02/2023   CHOLHDL 1 05/02/2023  He was in the Reveal Study x 4 years >> stopped. On Lipitor 20 and omega-3 fatty acids.  -  last eye exam was 02/08/2023: no DR-Dr. Nile Riggs.  Per previous reports, he did not have macular edema, but he has cystoid macular edema OD -Dr. Luciana Axe.  He has a history of cataract surgery.   - no numbness and tingling in his feet.  Last foot exam 05/02/2023.  He sees Dr. Stacie Acres. He had toe osteomyelitis and had to have it amputated in 01/2022.  He is now seeing Dr. Stacie Acres. He has onychomycosis.  He is compliant with CPAP for OSA, also has A. Fib, IBD -ulcerative pancolitis, GERD, also, obesity. In 2019 she was also diagnosed  with CHF -EF of 25 to 30%.  His Lasix was increased to twice a day(takes the second dose as needed). Cardiac cath >> no significant obstruction.  He was started on Entresto. He was admitted 3/20-24/2022 for hypoxemic respiratory failure in the setting of CHF after not taking his Lasix for a few weeks due to increased urination.  At that time, he was unresponsive, had A. fib with RVR at that time and a glucose level at 260.  He had to be intubated.  He recovered well afterwards.  He has a history of very low vitamin B12, improved: Lab Results  Component Value Date   VITAMINB12 758 05/02/2023   VITAMINB12 734 03/03/2022   VITAMINB12 1,256 (H) 09/02/2020   VITAMINB12 103 (L) 02/24/2020   VITAMINB12 267 05/01/2012   VITAMINB12 201 (L) 11/23/2010   He was started on B12 injections - per PCP.  Hypothyroidism:  Pt is on levothyroxine 50 mcg daily, taken: - in am - fasting - at least 30 min from b'fast - no Ca, Fe, MVI, PPIs - not on Biotin  Latest TSH was reviewed and this was normal: Lab Results  Component Value Date   TSH 1.55 05/02/2023   Pt denies: - feeling nodules in neck - hoarseness - dysphagia - choking  ROS: + See HPI  I reviewed pt's medications, allergies, PMH, social hx, family hx, and changes were documented in the history of present illness. Otherwise, unchanged from my initial visit note.  Past Medical History:  Diagnosis Date   B12 deficiency    per patient previously taking shots   C. difficile colitis 04/17/2019   C. difficile diarrhea    CAD (coronary artery disease), native coronary artery 04/12/2007   Chronic atrial fibrillation (HCC)    initial diagnoses 2012   Chronic combined systolic and diastolic CHF (congestive heart failure) (HCC) 04/15/2019   CKD (chronic kidney disease), stage III (HCC) 04/15/2019   CORONARY ARTERY DISEASE 04/12/2007   2 stents last in 2006.    Cystoid macular edema of right eye 01/16/2020   Cystoid macular edema of right eye  01/16/2020   DIABETES MELLITUS, TYPE II 04/16/2007   Diverticulosis of colon (without mention of hemorrhage)    GERD (gastroesophageal reflux disease) 08/09/2011   Hyperlipidemia 04/16/2007   10/24 reveal study end. Atorvastatin 20mg    HYPERTENSION 04/12/2007   HYPOTHYROIDISM 04/12/2007   Ischemic cardiomyopathy    Morbid obesity (HCC) 08/09/2011   MYOCARDIAL INFARCTION, HX OF 04/12/2007   Obstructive sleep apnea 04/12/2007   02/2011 - AHI 96/h CPAP 13, Lg FF      Stroke (HCC)    Type II diabetes mellitus with peripheral circulatory disorder (HCC) 04/16/2007   Dr. Elvera Lennox manages. Uses fructosamine.     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 Mesalamine.    VITAMIN B12 DEFICIENCY 11/29/2010  Past Surgical History:  Procedure Laterality Date   CARDIAC CATHETERIZATION  10/2002   STENT. 2 stents Dr. Dickie La   CARDIAC CATHETERIZATION N/A 10/28/2015   Procedure: Right/Left Heart Cath and Coronary Angiography;  Surgeon: Tonny Bollman, MD;  Location: [facility] INVASIVE CV LAB;  Service: Cardiovascular;  Laterality: N/A;   CATARACT EXTRACTION     2021 bilateral eyes    COLONOSCOPY  2012   Dr. Jarold Motto; findings consistent with universal UC s/p random biopsies, no active bleeding, polyps, masses.  Pathology with mild chronic active colitis.  Repeat in 5 years.   CORONARY STENT PLACEMENT  2008   LAD    I & D EXTREMITY Right 01/27/2022   Procedure: RIGHT SECOND METARTSOPHALANGEAL JOINT DISARTICULATION, IRRIGATION AND DEBRIDEMENT OF ABSCESS RIGHT FOOT;  Surgeon: Terance Hart, MD;  Location: [facility] OR;  Service: Orthopedics;  Laterality: Right;   RIGHT/LEFT HEART CATH AND CORONARY ANGIOGRAPHY N/A 06/01/2018   Procedure: RIGHT/LEFT HEART CATH AND CORONARY ANGIOGRAPHY;  Surgeon: Tonny Bollman, MD;  Location: [facility] INVASIVE CV LAB;  Service: Cardiovascular;  Laterality: N/A;   TONSILLECTOMY     Social History   Socioeconomic  History   Marital status: Married    Spouse name: Not on file   Number of children: 1   Years of education: Not on file   Highest education level: Not on file  Occupational History   Occupation: RETIRED    Employer: RETIRED  Tobacco Use   Smoking status: Former    Current packs/day: 0.00    Average packs/day: 1 pack/day for 5.0 years (5.0 ttl pk-yrs)    Types: Cigarettes    Start date: 04/13/1961    Quit date: 04/13/1966    Years since quitting: 57.1   Smokeless tobacco: Never  Vaping Use   Vaping status: Never Used  Substance and Sexual Activity   Alcohol use: No    Alcohol/week: 0.0 standard drinks of alcohol    Comment: no more beer   Drug use: No   Sexual activity: Not Currently  Other Topics Concern   Not on file  Social History Narrative   Cardiorehab 3 days a week. 45 minutes to an hour-stationary bike.    GRANDDAUGHTER (MS. 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       Married for 37 years in 2015, married previously for 14 years. Daughter with first wife and 3 grandkids.    Lives alone with wife. Get to see grandkids a lot. New grandchild in middle of 3      Hobbies-yardwork, previously liked to hunt and fish, does some target shooting      Land for 6 yrs   Social Determinants of Health   Financial Resource Strain: Low Risk  (11/14/2022)   Overall Financial Resource Strain (CARDIA)    Difficulty of Paying Living Expenses: Not hard at all  Food Insecurity: No Food Insecurity (11/14/2022)   Hunger Vital Sign    Worried About Running Out of Food in the Last Year: Never true    Ran Out of Food in the Last Year: Never true  Transportation Needs: No Transportation Needs (11/14/2022)   PRAPARE - Administrator, Civil Service (Medical): No    Lack of Transportation (Non-Medical): No  Physical Activity: Inactive (11/14/2022)   Exercise Vital Sign     Days of Exercise per Week: 0  days    Minutes of Exercise per Session: 0 min  Stress: No Stress Concern Present (11/14/2022)   [employer] of Occupational Health - Occupational Stress Questionnaire    Feeling of Stress : Not at all  Social Connections: Moderately Integrated (11/14/2022)   Social Connection and Isolation Panel [NHANES]    Frequency of Communication with Friends and Family: More than three times a week    Frequency of Social Gatherings with Friends and Family: More than three times a week    Attends Religious Services: More than 4 times per year    Active Member of [employer] or Organizations: No    Attends Banker Meetings: Never    Marital Status: Married  Catering manager Violence: Not At Risk (11/14/2022)   Humiliation, Afraid, Rape, and Kick questionnaire    Fear of Current or Ex-Partner: No    Emotionally Abused: No    Physically Abused: No    Sexually Abused: No   Current Outpatient Medications on File Prior to Visit  Medication Sig Dispense Refill   atorvastatin (LIPITOR) 20 MG tablet TAKE ONE TABLET (20MG  TOTAL) BY MOUTH DAILY 30 tablet 5   carvedilol (COREG) 25 MG tablet TAKE ONE TABLET BY MOUTH TWICE A DAY 60 tablet 6   clopidogrel (PLAVIX) 75 MG tablet TAKE ONE TABLET (75MG  TOTAL) BY MOUTH DAILY 90 tablet 3   Clotrimazole 1 % OINT Apply to penis twice daily after saline bath/rinse and pat dry (Patient taking differently: Apply 1 application  topically daily as needed (Apply to penis  after saline bath/rinse and pat dry).) 56.7 g 0   Coenzyme Q10 (COQ10 PO) Take 1 capsule by mouth daily with supper.     cyanocobalamin 1000 MCG tablet Take 1,000 mcg by mouth daily.     dicyclomine (BENTYL) 10 MG capsule TAKE ONE CAPSULE (10MG  TOTAL) BY MOUTH TWO TIMES DAILY AS NEEDED FOR DIARRHEA OR ABDOMINAL CRAMPING 90 capsule 1   ELIQUIS 5 MG TABS tablet TAKE ONE TABLET (5MG  TOTAL) BY MOUTH TWO TIMES DAILY (REPLACES PRADAXA) 180 tablet 1   fish oil-omega-3 fatty  acids 1000 MG capsule Take 1 g by mouth daily with supper.     furosemide (LASIX) 40 MG tablet Take 1 tablet (40 mg total) by mouth 2 (two) times daily. NEEDS FOLLOW UP APPOINTMENT FOR MORE REFILLS 60 tablet 0   glucose blood test strip Use as instructed to test 4 time daily 400 each 3   isosorbide mononitrate (IMDUR) 60 MG 24 hr tablet TAKE ONE (1) TABLET BY MOUTH EVERY DAY 90 tablet 1   JARDIANCE 10 MG TABS tablet TAKE ONE (1) TABLET BY MOUTH EVERY DAY 90 tablet 3   levothyroxine (SYNTHROID) 50 MCG tablet TAKE ONE TABLET BY MOUTH DAILY BEFORE BREAKFAST 90 tablet 0   mesalamine (APRISO) 0.375 g 24 hr capsule TAKE 4 CAPSULES (1.5 GRAMS TOTAL) BY MOUTH DAILY 120 capsule 11   metFORMIN (GLUCOPHAGE) 500 MG tablet TAKE ONE TABLET (500MG  TOTAL) BY MOUTH TWO TIMES DAILY WITH A MEAL 180 tablet 3   mupirocin ointment (BACTROBAN) 2 % Apply 1 Application topically 2 (two) times daily. Up to 2 weeks 30 g 0   saccharomyces boulardii (FLORASTOR) 250 MG capsule Take 1 capsule (250 mg total) by mouth 2 (two) times daily.     sacubitril-valsartan (ENTRESTO) 97-103 MG Take 1 tablet by mouth 2 (two) times daily. 60 tablet 11   sitaGLIPtin (JANUVIA) 100 MG tablet TAKE ONE (1) TABLET BY MOUTH EVERY DAY 90  tablet 3   spironolactone (ALDACTONE) 25 MG tablet TAKE ONE (1) TABLET BY MOUTH EVERY DAY 90 tablet 3   No current facility-administered medications on file prior to visit.   Allergies  Allergen Reactions   Clindamycin/Lincomycin     Developed C. difficile colitis 1 month after use   Family History  Problem Relation Age of Onset   Heart attack Mother        mid 38s   Heart disease Father        H/O CAD, CABG, VALVE SURGERY   Hypertension Brother    Obesity Brother    Stomach cancer Maternal Aunt    Stomach cancer Paternal Grandmother        ? colon    Colon cancer Neg Hx    PE: There were no vitals taken for this visit.  Wt Readings from Last 3 Encounters:  06/07/23 225 lb 12.8 oz (102.4 kg)   05/02/23 227 lb 6.4 oz (103.1 kg)  04/20/23 224 lb (101.6 kg)   Constitutional: overweight, in NAD Eyes: EOMI, no exophthalmos ENT: no thyromegaly, no cervical lymphadenopathy Cardiovascular: RRR, No MRG, + B periankle edema L>R Respiratory: CTA B Musculoskeletal: no deformities Skin: + stasis dermatitis rash BLE, + violaceous darker semicircular rash above the medial aspect of his left foot Neurological: no tremor with outstretched hands  ASSESSMENT: 1. DM2, non-insulin-dependent, previously uncontrolled, with complications - CAD, s/p MI 10/2006 - s/p stent - sees Dr. Excell Seltzer  2. HL  3.  Hypothyroidism  PLAN:  1. Patient with longstanding, previously uncontrolled, type 2 diabetes, but with significant improvement in control in the last 4.5 years.  Latest HbA1c was excellent, stable, at 6.0%. -We are using the lower dose of metformin for him after his C. difficile infection in 2020.  He was taken off sulfonylurea at discharge and we did not have to restart it.  In the past, I did suggest to try to come off Januvia but he wanted to continue with it.  At last visit, sugars were at goal, without significant hyperglycemic or hypoglycemic excursions.  We did not change his regimen. -At today's visit, HbA1c is higher (see below).  He mentions that this is due to several birthdays and eating more sweets.  However, reviewing his blood sugars his log, they are consistent with prior.  I do not feel that we need to change his regimen for now. -He has a rash on the medial aspect of his left leg, for which he saw Dr. Durene Cal yesterday.  He was started on mupirocin ointment.  If not improving, he will be referred to dermatology. - I advised him to Patient Instructions  Please continue:  - Metformin 500 mg 2x a day - Januvia 100 mg before breakfast - Jardiance 10 mg before breakfast   Please return in 6 months with your sugar log.   - we checked his HbA1c: 6.5% (higher) - advised to check sugars  at different times of the day - 1x a day, rotating check times - advised for yearly eye exams >> he is UTD - return to clinic in 6 months  2. HL -Reviewed latest lipid panel from last month: Fractions at goal: Lab Results  Component Value Date   CHOL 90 05/02/2023   HDL 61.00 05/02/2023   LDLCALC 12 05/02/2023   LDLDIRECT 18.0 06/13/2017   TRIG 85.0 05/02/2023   CHOLHDL 1 05/02/2023  -He continues Lipitor 20 mg daily and omega-3 fatty acids, without side effects  3.  Hypothyroidism - latest thyroid labs reviewed with pt. >> normal last month: Lab Results  Component Value Date   TSH 1.55 05/02/2023  - he continues on LT4 50 mcg daily - pt feels good on this dose. - we discussed about taking the thyroid hormone every day, with water, >30 minutes before breakfast, separated by >4 hours from acid reflux medications, calcium, iron, multivitamins. Pt. is taking it correctly.  Carlus Pavlov, MD PhD [facility] Endocrinology

## 2023-06-09 NOTE — Addendum Note (Signed)
Addended by: Pollie Meyer on: 06/09/2023 01:31 PM   Modules accepted: Orders

## 2023-06-22 ENCOUNTER — Encounter: Payer: Self-pay | Admitting: Internal Medicine

## 2023-07-07 ENCOUNTER — Other Ambulatory Visit: Payer: Self-pay | Admitting: Cardiovascular Disease

## 2023-07-10 DIAGNOSIS — E113312 Type 2 diabetes mellitus with moderate nonproliferative diabetic retinopathy with macular edema, left eye: Secondary | ICD-10-CM | POA: Diagnosis not present

## 2023-07-10 DIAGNOSIS — H26492 Other secondary cataract, left eye: Secondary | ICD-10-CM | POA: Diagnosis not present

## 2023-07-10 DIAGNOSIS — H26491 Other secondary cataract, right eye: Secondary | ICD-10-CM | POA: Diagnosis not present

## 2023-07-10 DIAGNOSIS — E113391 Type 2 diabetes mellitus with moderate nonproliferative diabetic retinopathy without macular edema, right eye: Secondary | ICD-10-CM | POA: Diagnosis not present

## 2023-07-10 DIAGNOSIS — H43813 Vitreous degeneration, bilateral: Secondary | ICD-10-CM | POA: Diagnosis not present

## 2023-07-10 LAB — HM DIABETES EYE EXAM

## 2023-07-13 ENCOUNTER — Encounter: Payer: Self-pay | Admitting: Internal Medicine

## 2023-07-14 ENCOUNTER — Encounter: Payer: Self-pay | Admitting: Family Medicine

## 2023-07-17 ENCOUNTER — Other Ambulatory Visit: Payer: Self-pay

## 2023-07-17 DIAGNOSIS — R21 Rash and other nonspecific skin eruption: Secondary | ICD-10-CM

## 2023-07-19 ENCOUNTER — Other Ambulatory Visit: Payer: Self-pay | Admitting: Family Medicine

## 2023-07-19 MED ORDER — MUPIROCIN 2 % EX OINT: 1.0000 | TOPICAL_OINTMENT | Freq: Two times a day (BID) | CUTANEOUS | 0 refills | Status: DC

## 2023-07-25 ENCOUNTER — Encounter: Payer: Self-pay | Admitting: Family Medicine

## 2023-07-26 NOTE — Telephone Encounter (Signed)
Pt has been scheduled for 07/27/23 @ 1:40 pm w/ Dr. Jon Billings.

## 2023-07-27 ENCOUNTER — Ambulatory Visit (INDEPENDENT_AMBULATORY_CARE_PROVIDER_SITE_OTHER): Payer: Medicare Other | Admitting: Internal Medicine

## 2023-07-27 ENCOUNTER — Encounter: Payer: Self-pay | Admitting: Internal Medicine

## 2023-07-27 VITALS — BP 90/62 | HR 75 | Temp 98.6°F | Ht 68.0 in | Wt 225.8 lb

## 2023-07-27 DIAGNOSIS — M79604 Pain in right leg: Secondary | ICD-10-CM | POA: Diagnosis not present

## 2023-07-27 DIAGNOSIS — Z8619 Personal history of other infectious and parasitic diseases: Secondary | ICD-10-CM | POA: Diagnosis not present

## 2023-07-27 DIAGNOSIS — R21 Rash and other nonspecific skin eruption: Secondary | ICD-10-CM

## 2023-07-27 DIAGNOSIS — M79605 Pain in left leg: Secondary | ICD-10-CM

## 2023-07-27 DIAGNOSIS — E11620 Type 2 diabetes mellitus with diabetic dermatitis: Secondary | ICD-10-CM | POA: Diagnosis not present

## 2023-07-27 DIAGNOSIS — I5022 Chronic systolic (congestive) heart failure: Secondary | ICD-10-CM

## 2023-07-27 DIAGNOSIS — I4821 Permanent atrial fibrillation: Secondary | ICD-10-CM

## 2023-07-27 DIAGNOSIS — E1151 Type 2 diabetes mellitus with diabetic peripheral angiopathy without gangrene: Secondary | ICD-10-CM

## 2023-07-27 DIAGNOSIS — L509 Urticaria, unspecified: Secondary | ICD-10-CM | POA: Insufficient documentation

## 2023-07-27 DIAGNOSIS — Z7984 Long term (current) use of oral hypoglycemic drugs: Secondary | ICD-10-CM | POA: Diagnosis not present

## 2023-07-27 MED ORDER — PREDNISONE 20 MG PO TABS
20.0000 mg | ORAL_TABLET | Freq: Every day | ORAL | 0 refills | Status: DC
Start: 2023-07-27 — End: 2023-08-16

## 2023-07-27 MED ORDER — TRIAMCINOLONE ACETONIDE 0.1 % EX CREA
1.0000 | TOPICAL_CREAM | Freq: Two times a day (BID) | CUTANEOUS | 0 refills | Status: DC
Start: 2023-07-27 — End: 2023-11-13

## 2023-07-27 MED ORDER — CEPHALEXIN 500 MG PO CAPS
500.0000 mg | ORAL_CAPSULE | Freq: Three times a day (TID) | ORAL | 0 refills | Status: DC
Start: 2023-07-27 — End: 2023-08-16

## 2023-07-27 NOTE — Progress Notes (Signed)
[facility]: [phone]   -- Medical Office Visit --  Patient:  [name]      Age: 77 y.o.       Sex:  male  Date:   07/27/2023 Patient Care Team: Shelva Majestic, MD as PCP - General (Family Medicine) Tonny Bollman, MD as PCP - Cardiology (Cardiology) Hillis Range, MD (Inactive) as PCP - Electrophysiology (Cardiology) Bensimhon, Bevelyn Buckles, MD as PCP - Advanced Heart Failure (Cardiology) Jethro Bolus, MD as Consulting Physician (Ophthalmology) Carlus Pavlov, MD as Consulting Physician (Endocrinology) Meryl Dare, MD as Consulting Physician (Gastroenterology) Jena Gauss Gerrit Friends, MD as Consulting Physician (Gastroenterology) Jena Gauss Gerrit Friends, MD as Consulting Physician (Gastroenterology) Today's Healthcare Provider: Lula Olszewski, MD   Assessment & Plan Rash Seems like he has urticarial rash and a separate ankle rash, and some bruising on arms from scratching self from urticarial rash.  Cause of urticaria and ankle rash are still unknown.  He presents with two distinct rashes; one described as hives has resolved, while the second, located on the back of the arms and ankles, remains non-pruritic and non-tender, suggesting possible infection or autoimmune process. We will start a Keflex antibiotic trial for one week to rule out infection and order Prednisone 40mg  for 5 days to assess response against an autoimmune process. Venous and arterial ultrasounds of the legs will be conducted to exclude a vascular etiology, alongside blood work including a complete blood count and cholesterol levels. A dermatology referral will continue.  ## Detailed Assessment:  1. Undiagnosed skin condition:    - Reddish-purple discoloration, inflammation, and possible scaling on arms and ankles    - Present for at least 1.5 months, showing some improvement    - Recent history of hives    - Differential diagnoses:      a) Diabetic dermopathy (NLD)      b) Vasculitis  (possibly related to diabetes or other conditions)      c) Stasis dermatitis      d) Drug eruption (less likely given no new medications)      e) celluliits from scratch (old picture looks like he was scratched but is uncertain)  ## Plan: Skin condition management:    - Referring to dermatology for definitive diagnosis and treatment plan but this is put on months long delay    - Consider skin biopsy if etiology remains unclear    - Initiate gentle skin care regimen:      * Moisturize affected areas twice daily      * Avoid hot water and harsh soaps      * Monitor for any changes or spread of the condition    -  will trial short-term use of topical corticosteroids for inflammation, due to long delay until dermatology can be seen.   Laboratory tests:    - Complete blood count (CBC) with differential    - Comprehensive metabolic panel (CMP)    - Lipid panel    - Thyroid function tests (TFTs)    - HbA1c Imaging:    - Doppler ultrasound of lower extremities to assess peripheral circulation was offered but patient declined to move forward with this suggestion; has full decision making capacity  NLD (necrobiosis lipoidica diabeticorum) (HCC) Had Dr. Ruthine Dose look at the rash and she suggested it might be this- and I think for the ankle part of the rash it fits well, but we need biopsy to prove.  Trial triamcinolone ointment Hives of unknown origin All  the hives are presently gone but bruises on forearm seem to be due to scratching from the hives, while on blood thinner. Permanent atrial fibrillation (HCC) Check labs while here per patient request.(Daughter would like) Chronic systolic congestive heart failure, NYHA class 2 (HCC) Check labs while here per patient request.(Daughter would like) Type II diabetes mellitus with peripheral circulatory disorder (HCC) Check Hemoglobin A1c with other labs History of infection of intestine due to Clostridioides difficile He has a history of C. diff  infection following antibiotic use and is currently taking probiotics (Florastor). We advise continuing probiotics while on antibiotics to prevent C. diff recurrence and encourage the consumption of yogurt for additional probiotics.  Follow-up in one week is scheduled to assess the response to treatment and review the results of the ordered tests.      has Hypothyroidism; Type II diabetes mellitus with peripheral circulatory disorder (HCC); Hyperlipidemia; Essential hypertension; CAD (coronary artery disease), native coronary artery; Obstructive sleep apnea; Vitamin B12 deficiency; Atrial fibrillation (HCC); Ulcerative pancolitis without complication (HCC); GERD (gastroesophageal reflux disease); Allergic rhinitis; CHF (congestive heart failure), NYHA class II (HCC); Primary osteoarthritis of left knee; History of transient ischemic attack (TIA); MCI (mild cognitive impairment); Former smoker; Chronic combined systolic and diastolic CHF (congestive heart failure) (HCC); Hypokalemia; C. 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  Current Outpatient Medications on File Prior to Visit  Medication Sig Dispense Refill   atorvastatin (LIPITOR) 20 MG tablet TAKE ONE TABLET (20MG  TOTAL) BY MOUTH DAILY 30 tablet 5   carvedilol (COREG) 25 MG tablet TAKE ONE TABLET BY MOUTH TWICE A DAY 60 tablet 6   clopidogrel (PLAVIX) 75 MG tablet TAKE ONE TABLET (75MG  TOTAL) BY MOUTH DAILY 90 tablet 3   Clotrimazole 1 % OINT Apply to penis twice  daily after saline bath/rinse and pat dry (Patient taking differently: Apply 1 application  topically daily as needed (Apply to penis  after saline bath/rinse and pat dry).) 56.7 g 0   Coenzyme Q10 (COQ10 PO) Take 1 capsule by mouth daily with supper.     cyanocobalamin 1000 MCG tablet Take 1,000 mcg by mouth daily.     dicyclomine (BENTYL) 10 MG capsule TAKE ONE CAPSULE (10MG  TOTAL) BY MOUTH TWO TIMES DAILY AS NEEDED FOR DIARRHEA OR ABDOMINAL CRAMPING 90 capsule 1   ELIQUIS 5 MG TABS tablet TAKE ONE TABLET (5MG  TOTAL) BY MOUTH TWO TIMES DAILY (REPLACES PRADAXA) 180 tablet 1   fish oil-omega-3 fatty acids 1000 MG capsule Take 1 g by mouth daily with supper.     furosemide (LASIX) 40 MG tablet Take 1 tablet (40 mg total) by mouth 2 (two) times daily. NEEDS FOLLOW UP APPOINTMENT FOR MORE REFILLS 60 tablet 0   glipiZIDE (GLUCOTROL) 10 MG tablet Take by mouth.     glucosamine-chondroitin 500-400 MG tablet Take by mouth.     glucose blood test strip Use as instructed to test 4 time daily 400 each 3   isosorbide mononitrate (IMDUR) 60 MG 24 hr tablet TAKE ONE (1) TABLET BY MOUTH EVERY DAY 90 tablet 3   JARDIANCE 10 MG TABS tablet TAKE ONE (1) TABLET BY MOUTH EVERY  DAY 90 tablet 3   levothyroxine (SYNTHROID) 50 MCG tablet Take 1 tablet (50 mcg total) by mouth daily before breakfast. 90 tablet 3   mesalamine (APRISO) 0.375 g 24 hr capsule TAKE 4 CAPSULES (1.5 GRAMS TOTAL) BY MOUTH DAILY 120 capsule 11   metFORMIN (GLUCOPHAGE) 500 MG tablet TAKE ONE TABLET (500MG  TOTAL) BY MOUTH TWO TIMES DAILY WITH A MEAL 180 tablet 3   mupirocin ointment (BACTROBAN) 2 % Apply 1 Application topically 2 (two) times daily. Up to 2 weeks 30 g 0   olmesartan (BENICAR) 40 MG tablet Take by mouth.     saccharomyces boulardii (FLORASTOR) 250 MG capsule Take 1 capsule (250 mg total) by mouth 2 (two) times daily.     sacubitril-valsartan (ENTRESTO) 97-103 MG Take 1 tablet by mouth 2 (two) times daily. 60 tablet 11   sitaGLIPtin  (JANUVIA) 100 MG tablet TAKE ONE (1) TABLET BY MOUTH EVERY DAY 90 tablet 3   spironolactone (ALDACTONE) 25 MG tablet TAKE ONE (1) TABLET BY MOUTH EVERY DAY 90 tablet 3   No current facility-administered medications on file prior to visit.     Diagnoses and all orders for this visit: Rash -     Lipid panel -     Comprehensive metabolic panel -     CBC with Differential/Platelet -     TSH Rfx on Abnormal to Free T4 -     Lipid Panel w/reflex Direct LDL -     Sedimentation rate -     C-reactive protein -     VAS [place] LOWER EXTREMITY VENOUS (DVT); Future -     US ARTERIAL LOWER EXTREMITY DUPLEX LEFT (NON-ABI); Future -     cephALEXin (KEFLEX) 500 MG capsule; Take 1 capsule (500 mg total) by mouth 3 (three) times daily. -     predniSONE (DELTASONE) 20 MG tablet; Take 1 tablet (20 mg total) by mouth daily with breakfast. -     triamcinolone cream (KENALOG) 0.1 %; Apply 1 Application topically 2 (two) times daily. Permanent atrial fibrillation (HCC) -     Lipid panel -     Comprehensive metabolic panel -     CBC with Differential/Platelet -     TSH Rfx on Abnormal to Free T4 -     Lipid Panel w/reflex Direct LDL -     Sedimentation rate -     C-reactive protein -     VAS [place] LOWER EXTREMITY VENOUS (DVT); Future -     US ARTERIAL LOWER EXTREMITY DUPLEX LEFT (NON-ABI); Future -     cephALEXin (KEFLEX) 500 MG capsule; Take 1 capsule (500 mg total) by mouth 3 (three) times daily. -     predniSONE (DELTASONE) 20 MG tablet; Take 1 tablet (20 mg total) by mouth daily with breakfast. Chronic systolic congestive heart failure, NYHA class 2 (HCC) -     Lipid panel NLD (necrobiosis lipoidica diabeticorum) (HCC) Hives of unknown origin Type II diabetes mellitus with peripheral circulatory disorder (HCC) -     HgB A1c History of infection of intestine due to Clostridioides difficile  Recommended follow-up: No follow-ups on file. Future Appointments  Date Time Provider Department Center   08/16/2023  1:40 PM Bensimhon, Bevelyn Buckles, MD [facility] None  08/30/2023  3:45 PM Helane Gunther, DPM [facility]  11/09/2023  3:00 PM Shelva Majestic, MD [facility]  11/23/2023  8:45 AM [facility] ANNUAL WELLNESS VISIT 1 [facility]        Subjective   77  y.o. male who has Hypothyroidism; Type II diabetes mellitus with peripheral circulatory disorder (HCC); Hyperlipidemia; Essential hypertension; CAD (coronary artery disease), native coronary artery; Obstructive sleep apnea; Vitamin B12 deficiency; Atrial fibrillation (HCC); Ulcerative pancolitis without complication (HCC); GERD (gastroesophageal reflux disease); Allergic rhinitis; CHF (congestive heart failure), NYHA class II (HCC); Primary osteoarthritis of left knee; History of transient ischemic attack (TIA); MCI (mild cognitive impairment); Former smoker; Chronic combined systolic and diastolic CHF (congestive heart failure) (HCC); Hypokalemia; C. 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 His reasons/main concerns/chief complaints for today's office visit are Discuss rash (For a few weeks on different areas of body. Redness and itchy.)   ------------------------------------------------------------------------------------------------------------------------ AI-Extracted: Discussed the use of AI scribe software for clinical note transcription with the patient, who gave verbal consent to proceed.  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 The first rash, initially suspected to be shingles, appeared on the back of both arms and ankles about a week ago. The rash, which the patient described as non-itchy, non-burning, and non-sensitive to touch, seemed to be healing. The patient also reported a second rash, described as hives, which appeared more recently and was widespread across the body. This rash was itchy and seemed to have resolved on its own.  The patient reported a history of exposure to machinery and frequent minor injuries such as scratches. Notably, the patient had a scratch on the left ankle around the time the first rash appeared. The patient also reported a recent beach trip, but clarified that the trip occurred after the onset of the rashes.  The patient's granddaughter, a resident nurse, was involved in the consultation and provided additional information. She mentioned that the patient had a history of C. diff infection following a course of clindamycin for dental work. The patient expressed concern about taking antibiotics due to this past experience. The patient is currently on Florastor, a probiotic, to maintain gut health.  The patient's diet is primarily vegetable-based, with minimal meat intake. The patient also reported a recent obsession with cucumbers and tomatoes. Despite these dietary habits, the patient could not identify any specific food triggers for the rashes. The patient also reported using a topical ointment on the first rash, but it did not seem to improve the condition.      He has a past medical history of B12 deficiency, C. difficile colitis (04/17/2019), C. 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/2020), DIABETES MELLITUS, TYPE II (04/16/2007), Diverticulosis of colon (without mention of hemorrhage), GERD (gastroesophageal reflux disease) (08/09/2011), Hyperlipidemia (04/16/2007), HYPERTENSION (04/12/2007), HYPOTHYROIDISM (04/12/2007), Ischemic cardiomyopathy, Morbid obesity (HCC) (08/09/2011), MYOCARDIAL INFARCTION, HX OF (04/12/2007), Obstructive sleep apnea (04/12/2007), Stroke (HCC), Type II diabetes mellitus with peripheral circulatory disorder (HCC) (04/16/2007), ULCERATIVE COLITIS, LEFT SIDED (11/23/2010),  Ulcerative pancolitis without complication (HCC) (08/09/2011), and VITAMIN B12 DEFICIENCY (11/29/2010).  Problem list overviews that were updated at today's visit: Problem  Nld (Necrobiosis Lipoidica Diabeticorum) (Hcc)  Hives of Unknown Origin  History of Infection of Intestine Due to Clostridioides Difficile   Current Outpatient Medications on File Prior to Visit  Medication Sig   atorvastatin (LIPITOR) 20 MG tablet TAKE ONE TABLET (20MG  TOTAL) BY MOUTH DAILY   carvedilol (COREG) 25 MG tablet TAKE ONE TABLET BY MOUTH TWICE A DAY   clopidogrel (PLAVIX) 75 MG tablet TAKE ONE TABLET (75MG  TOTAL) BY MOUTH DAILY   Clotrimazole 1 % OINT Apply to penis twice daily after saline bath/rinse and pat dry (Patient taking differently: Apply 1 application  topically daily as needed (Apply to penis  after saline bath/rinse and pat dry).)   Coenzyme Q10 (COQ10 PO) Take 1 capsule by mouth daily with supper.   cyanocobalamin 1000 MCG tablet Take 1,000 mcg by mouth daily.   dicyclomine (BENTYL) 10 MG capsule TAKE ONE CAPSULE (10MG  TOTAL) BY MOUTH TWO TIMES DAILY AS NEEDED FOR DIARRHEA OR ABDOMINAL CRAMPING   ELIQUIS 5 MG TABS tablet TAKE ONE TABLET (5MG  TOTAL) BY MOUTH TWO TIMES DAILY (REPLACES PRADAXA)   fish oil-omega-3 fatty acids 1000  MG capsule Take 1 g by mouth daily with supper.   furosemide (LASIX) 40 MG tablet Take 1 tablet (40 mg total) by mouth 2 (two) times daily. NEEDS FOLLOW UP APPOINTMENT FOR MORE REFILLS   glipiZIDE (GLUCOTROL) 10 MG tablet Take by mouth.   glucosamine-chondroitin 500-400 MG tablet Take by mouth.   glucose blood test strip Use as instructed to test 4 time daily   isosorbide mononitrate (IMDUR) 60 MG 24 hr tablet TAKE ONE (1) TABLET BY MOUTH EVERY DAY   JARDIANCE 10 MG TABS tablet TAKE ONE (1) TABLET BY MOUTH EVERY DAY   levothyroxine (SYNTHROID) 50 MCG tablet Take 1 tablet (50 mcg total) by mouth daily before breakfast.   mesalamine (APRISO) 0.375 g 24 hr capsule TAKE 4 CAPSULES (1.5 GRAMS TOTAL) BY MOUTH DAILY   metFORMIN (GLUCOPHAGE) 500 MG tablet TAKE ONE TABLET (500MG  TOTAL) BY MOUTH TWO TIMES DAILY WITH A MEAL   mupirocin ointment (BACTROBAN) 2 % Apply 1 Application topically 2 (two) times daily. Up to 2 weeks   olmesartan (BENICAR) 40 MG tablet Take by mouth.   saccharomyces boulardii (FLORASTOR) 250 MG capsule Take 1 capsule (250 mg total) by mouth 2 (two) times daily.   sacubitril-valsartan (ENTRESTO) 97-103 MG Take 1 tablet by mouth 2 (two) times daily.   sitaGLIPtin (JANUVIA) 100 MG tablet TAKE ONE (1) TABLET BY MOUTH EVERY DAY   spironolactone (ALDACTONE) 25 MG tablet TAKE ONE (1) TABLET BY MOUTH EVERY DAY   No current facility-administered medications on file prior to visit.  There are no discontinued medications.   Objective   Physical Exam  BP 90/62 (BP Location: Left Arm, Patient Position: Sitting)   Pulse 75   Temp 98.6 F (37 C) (Temporal)   Ht 5\' 8"  (1.727 m)   Wt 225 lb 12.8 oz (102.4 kg)   SpO2 97%   BMI 34.33 kg/m  Wt Readings from Last 10 Encounters:  07/27/23 225 lb 12.8 oz (102.4 kg)  06/08/23 225 lb 3.2 oz (102.2 kg)  06/07/23 225 lb 12.8 oz (102.4 kg)  05/02/23 227 lb 6.4 oz (103.1 kg)  04/20/23 224 lb (101.6 kg)  12/01/22 219 lb 12.8 oz (99.7 kg)   12/01/22 221 lb 6.4 oz (100.4  kg)  11/14/22 222 lb (100.7 kg)  09/27/22 222 lb 3.2 oz (100.8 kg)  07/13/22 222 lb 12.8 oz (101.1 kg)   Vital signs reviewed.  Nursing notes reviewed. Weight trend reviewed. Abnormalities and Problem-Specific physical exam findings:  Photographs Taken 07/27/2023 :     When comparing these rashes to the August pictures of the same rash on the ankle on the left it seems that the dark purple raised border that used to be present has faded and flattened while the area beneath the border that was sort of sunken and slightly pinkish and has now gotten a little more purpleish and less sunken and has some scaling overall impression is that it is improving/resolving General Appearance:  No acute distress appreciable.   Well-groomed, healthy-appearing male.  Well proportioned with no abnormal fat distribution.  Good muscle tone. Pulmonary:  Normal work of breathing at rest, no respiratory distress apparent. SpO2: 97 %  Musculoskeletal:  Neurological:  Awake, alert, oriented, and engaged.  No obvious focal neurological deficits or cognitive impairments.  Sensorium seems unclouded.   Speech is clear and coherent with logical content. Psychiatric:  Appropriate mood, pleasant and cooperative demeanor, thoughtful and engaged during the exam  Results            No results found for any visits on 07/27/23.  Abstract on 07/13/2023  Component Date Value   [facility] Diabetic Eye Exam 07/10/2023 Retinopathy (A)   Office Visit on 06/08/2023  Component Date Value   Hemoglobin A1C 06/08/2023 6.5 (A)   Office Visit on 05/02/2023  Component Date Value   Sodium 05/02/2023 138    Potassium 05/02/2023 4.3    Chloride 05/02/2023 103    CO2 05/02/2023 25    Glucose, Bld 05/02/2023 101 (H)    BUN 05/02/2023 20    Creatinine, Ser 05/02/2023 1.33    Total Bilirubin 05/02/2023 0.7    Alkaline Phosphatase 05/02/2023 59    AST 05/02/2023 21    ALT 05/02/2023 15    Total Protein  05/02/2023 6.8    Albumin 05/02/2023 4.3    GFR 05/02/2023 51.64 (L)    Calcium 05/02/2023 9.3    WBC 05/02/2023 8.5    RBC 05/02/2023 5.24    Hemoglobin 05/02/2023 15.8    HCT 05/02/2023 47.3    MCV 05/02/2023 90.3    MCHC 05/02/2023 33.4    RDW 05/02/2023 14.7    Platelets 05/02/2023 139.0 (L)    Neutrophils Relative % 05/02/2023 77.8 (H)    Lymphocytes Relative 05/02/2023 13.9    Monocytes Relative 05/02/2023 6.4    Eosinophils Relative 05/02/2023 1.4    Basophils Relative 05/02/2023 0.5    Neutro Abs 05/02/2023 6.6    Lymphs Abs 05/02/2023 1.2    Monocytes Absolute 05/02/2023 0.5    Eosinophils Absolute 05/02/2023 0.1    Basophils Absolute 05/02/2023 0.0    TSH 05/02/2023 1.55    Vitamin B-12 05/02/2023 758    Cholesterol 05/02/2023 90    Triglycerides 05/02/2023 85.0    HDL 05/02/2023 61.00    VLDL 05/02/2023 17.0    LDL Cholesterol 05/02/2023 12    Total CHOL/HDL Ratio 05/02/2023 1    NonHDL 05/02/2023 28.58   Abstract on 02/09/2023  Component Date Value   [facility] Diabetic Eye Exam 02/08/2023 No Retinopathy   Office Visit on 12/01/2022  Component Date Value   Hemoglobin A1C 12/01/2022 6.0 (A)   Office Visit on 09/27/2022  Component Date Value   Microalb, Ur 09/27/2022 2.8 (H)  Creatinine,U 09/27/2022 72.1    Microalb Creat Ratio 09/27/2022 3.8    TSH 09/27/2022 1.02    Sodium 09/27/2022 140    Potassium 09/27/2022 4.6    Chloride 09/27/2022 102    CO2 09/27/2022 32    Glucose, Bld 09/27/2022 120 (H)    BUN 09/27/2022 17    Creatinine, Ser 09/27/2022 1.29    Total Bilirubin 09/27/2022 1.0    Alkaline Phosphatase 09/27/2022 61    AST 09/27/2022 12    ALT 09/27/2022 8    Total Protein 09/27/2022 6.1    Albumin 09/27/2022 4.0    GFR 09/27/2022 53.79 (L)    Calcium 09/27/2022 8.9        Additional Info: This encounter employed real-time, collaborative documentation. The patient actively reviewed and updated their medical record on a shared screen, ensuring  transparency and facilitating joint problem-solving for the problem list, overview, and plan. This approach promotes accurate, informed care. The treatment plan was discussed and reviewed in detail, including medication safety, potential side effects, and all patient questions. We confirmed understanding and comfort with the plan. Follow-up instructions were established, including contacting the office for any concerns, returning if symptoms worsen, persist, or new symptoms develop, and precautions for potential emergency department visits.

## 2023-07-27 NOTE — Assessment & Plan Note (Signed)
Check Hemoglobin A1c with other labs

## 2023-07-27 NOTE — Assessment & Plan Note (Signed)
Check labs while here per patient request.(Daughter would like)

## 2023-07-27 NOTE — Assessment & Plan Note (Signed)
He has a history of C. diff infection following antibiotic use and is currently taking probiotics (Florastor). We advise continuing probiotics while on antibiotics to prevent C. diff recurrence and encourage the consumption of yogurt for additional probiotics.

## 2023-07-27 NOTE — Patient Instructions (Addendum)
VISIT SUMMARY:  During your visit, we discussed two distinct rashes that you've experienced recently. One rash, which you described as hives, has already resolved on its own. The other rash, located on the back of your arms and ankles, is still present but seems to be healing. We also discussed your history of C. diff infection following antibiotic use.  YOUR PLAN:  -RASH: We suspect that your rash could be due to an infection or an autoimmune process. To determine the cause, we're starting you on a trial of Keflex, an antibiotic, for one week and Prednisone, a medication to reduce inflammation, for 5 days. We'll also conduct ultrasounds of your legs and order some blood tests. Additionally, we'll continue with your dermatology referral. We also will trial cortisone cream.    -HISTORY OF C. DIFF INFECTION: Given your history of C. diff infection after taking antibiotics, we recommend that you continue taking your probiotics (Florastor) while on antibiotics. Eating yogurt can also provide additional probiotics.  INSTRUCTIONS: Please continue taking your probiotics while on antibiotics and consider adding yogurt to your diet for additional probiotics. We'll follow up in one week to see how you're responding to the treatment and to review the results of your tests.  This is something I am considering as a possible explanation... but needs a biopsy to confirm: Diabetic lipoid necrosis, also known as necrobiosis lipoidica diabeticorum (NLD), is a rare skin condition that's associated with diabetes and causes reddish brown lesions on the skin:  Symptoms NLD appears as firm, smooth, red bumps (papules) that develop into plaques with a shiny yellow brown center and raised red to purplish edges. Lesions can spread and join together, and may ulcerate.  Risk factors NLD is more common in women, and in people with type 1 or type 2 diabetes. Other risk factors include smoking, thyroid disease, celiac disease,  hypertension, obesity, and chronic heart failure.  Diagnosis A biopsy of the lesion is needed to diagnose NLD.  Treatment Topical cortisone creams or cortisone injections can help treat NLD, but there's no conclusive treatment regimen. Treatment can help stop lesions from spreading, but NLD is a chronic condition that usually goes through stages of activity and inactivity.  Complications Ulcerations can be painful, become infected, and heal with scarring. In rare cases, NLD can progress to squamous cell carcinoma.

## 2023-07-27 NOTE — Assessment & Plan Note (Signed)
All the hives are presently gone but bruises on forearm seem to be due to scratching from the hives, while on blood thinner.

## 2023-07-27 NOTE — Assessment & Plan Note (Signed)
Had Dr. Ruthine Dose look at the rash and she suggested it might be this- and I think for the ankle part of the rash it fits well, but we need biopsy to prove.  Trial triamcinolone ointment

## 2023-07-28 LAB — LIPID PANEL
Cholesterol: 86 mg/dL (ref 0–200)
HDL: 54.6 mg/dL (ref >39.00)
LDL Cholesterol: 11 mg/dL (ref 0–99)
NonHDL: 31.82
Total CHOL/HDL Ratio: 2
Triglycerides: 106 mg/dL (ref 0.0–149.0)
VLDL: 21.2 mg/dL (ref 0.0–40.0)

## 2023-07-28 LAB — COMPREHENSIVE METABOLIC PANEL
ALT: 12 U/L (ref 0–53)
AST: 18 U/L (ref 0–37)
Albumin: 4.2 g/dL (ref 3.5–5.2)
Alkaline Phosphatase: 81 U/L (ref 39–117)
BUN: 25 mg/dL — ABNORMAL HIGH (ref 6–23)
CO2: 30 meq/L (ref 19–32)
Calcium: 9.5 mg/dL (ref 8.4–10.5)
Chloride: 100 meq/L (ref 96–112)
Creatinine, Ser: 1.28 mg/dL (ref 0.40–1.50)
GFR: 53.98 mL/min — ABNORMAL LOW (ref >60.00)
Glucose, Bld: 101 mg/dL — ABNORMAL HIGH (ref 70–99)
Potassium: 4.5 meq/L (ref 3.5–5.1)
Sodium: 139 meq/L (ref 135–145)
Total Bilirubin: 0.8 mg/dL (ref 0.2–1.2)
Total Protein: 6.8 g/dL (ref 6.0–8.3)

## 2023-07-28 LAB — LIPID PANEL W/REFLEX DIRECT LDL
Cholesterol: 89 mg/dL (ref <200)
HDL: 56 mg/dL (ref >=40)
LDL Cholesterol (Calc): 13 mg/dL
Non-HDL Cholesterol (Calc): 33 mg/dL (ref <130)
Total CHOL/HDL Ratio: 1.6 (calc) (ref <5.0)
Triglycerides: 110 mg/dL (ref <150)

## 2023-07-28 LAB — CBC WITH DIFFERENTIAL/PLATELET
Basophils Absolute: 0.1 10*3/uL (ref 0.0–0.1)
Basophils Relative: 1 % (ref 0.0–3.0)
Eosinophils Absolute: 0.1 10*3/uL (ref 0.0–0.7)
Eosinophils Relative: 1.4 % (ref 0.0–5.0)
HCT: 46.5 % (ref 39.0–52.0)
Hemoglobin: 15.6 g/dL (ref 13.0–17.0)
Lymphocytes Relative: 10.3 % — ABNORMAL LOW (ref 12.0–46.0)
Lymphs Abs: 0.9 10*3/uL (ref 0.7–4.0)
MCHC: 33.6 g/dL (ref 30.0–36.0)
MCV: 90.8 fL (ref 78.0–100.0)
Monocytes Absolute: 0.5 10*3/uL (ref 0.1–1.0)
Monocytes Relative: 6 % (ref 3.0–12.0)
Neutro Abs: 7.5 10*3/uL (ref 1.4–7.7)
Neutrophils Relative %: 81.3 % — ABNORMAL HIGH (ref 43.0–77.0)
Platelets: 163 10*3/uL (ref 150.0–400.0)
RBC: 5.12 Mil/uL (ref 4.22–5.81)
RDW: 14.3 % (ref 11.5–15.5)
WBC: 9.2 10*3/uL (ref 4.0–10.5)

## 2023-07-28 LAB — SEDIMENTATION RATE: Sed Rate: 5 mm/h (ref 0–20)

## 2023-07-28 LAB — TSH RFX ON ABNORMAL TO FREE T4: TSH: 1.72 u[IU]/mL (ref 0.450–4.500)

## 2023-07-28 LAB — C-REACTIVE PROTEIN: CRP: <1 mg/dL (ref 0.5–20.0)

## 2023-08-02 ENCOUNTER — Telehealth: Payer: Self-pay | Admitting: Family Medicine

## 2023-08-02 NOTE — Telephone Encounter (Signed)
See below and please follow up, this was ordered by Dr. Jon Billings.

## 2023-08-02 NOTE — Telephone Encounter (Signed)
[facility] would like a call back about pts order for [place], they have some questions about the order. Please call [phone], option 1, then option 5

## 2023-08-03 DIAGNOSIS — I872 Venous insufficiency (chronic) (peripheral): Secondary | ICD-10-CM | POA: Diagnosis not present

## 2023-08-03 DIAGNOSIS — L821 Other seborrheic keratosis: Secondary | ICD-10-CM | POA: Diagnosis not present

## 2023-08-07 NOTE — Telephone Encounter (Signed)
Left vm for patient to call the office back concerning the status of his leg.

## 2023-08-07 NOTE — Telephone Encounter (Signed)
Spoke with [facility], updated diagnosis and verbally informed Dr. Jon Billings that the [place] can only be done with ABI (it was ordered as non-ABI) per the [facility] staff that I spoke with.

## 2023-08-07 NOTE — Telephone Encounter (Signed)
Left vm for patient's granddaughter to call the office back concerning the status of patient's leg.

## 2023-08-07 NOTE — Addendum Note (Signed)
Addended by: Donzetta Starch on: 08/07/2023 05:35 PM   Modules accepted: Orders

## 2023-08-16 ENCOUNTER — Encounter (HOSPITAL_COMMUNITY): Payer: Self-pay | Admitting: Internal Medicine

## 2023-08-16 ENCOUNTER — Encounter: Payer: Self-pay | Admitting: Gastroenterology

## 2023-08-16 ENCOUNTER — Ambulatory Visit (INDEPENDENT_AMBULATORY_CARE_PROVIDER_SITE_OTHER): Payer: Medicare Other | Admitting: Gastroenterology

## 2023-08-16 ENCOUNTER — Ambulatory Visit (HOSPITAL_COMMUNITY)
Admission: RE | Admit: 2023-08-16 | Discharge: 2023-08-16 | Disposition: A | Discharge status: Home / Self Care | Payer: Medicare Other | Source: Ambulatory Visit | Attending: Internal Medicine | Admitting: Internal Medicine

## 2023-08-16 VITALS — BP 90/66 | HR 70 | Wt 222.4 lb

## 2023-08-16 VITALS — BP 104/65 | HR 70 | Temp 98.6°F | Ht 68.0 in | Wt 221.4 lb

## 2023-08-16 DIAGNOSIS — I5042 Chronic combined systolic (congestive) and diastolic (congestive) heart failure: Secondary | ICD-10-CM | POA: Diagnosis not present

## 2023-08-16 DIAGNOSIS — I130 Hypertensive heart and chronic kidney disease with heart failure and stage 1 through stage 4 chronic kidney disease, or unspecified chronic kidney disease: Secondary | ICD-10-CM | POA: Diagnosis not present

## 2023-08-16 DIAGNOSIS — E1122 Type 2 diabetes mellitus with diabetic chronic kidney disease: Secondary | ICD-10-CM | POA: Insufficient documentation

## 2023-08-16 DIAGNOSIS — I10 Essential (primary) hypertension: Secondary | ICD-10-CM

## 2023-08-16 DIAGNOSIS — N183 Chronic kidney disease, stage 3 unspecified: Secondary | ICD-10-CM | POA: Insufficient documentation

## 2023-08-16 DIAGNOSIS — K5100 Ulcerative (chronic) pancolitis without complications: Secondary | ICD-10-CM

## 2023-08-16 DIAGNOSIS — I2510 Atherosclerotic heart disease of native coronary artery without angina pectoris: Secondary | ICD-10-CM | POA: Diagnosis not present

## 2023-08-16 DIAGNOSIS — R197 Diarrhea, unspecified: Secondary | ICD-10-CM

## 2023-08-16 DIAGNOSIS — G4733 Obstructive sleep apnea (adult) (pediatric): Secondary | ICD-10-CM | POA: Insufficient documentation

## 2023-08-16 DIAGNOSIS — I482 Chronic atrial fibrillation, unspecified: Secondary | ICD-10-CM | POA: Insufficient documentation

## 2023-08-16 DIAGNOSIS — E1151 Type 2 diabetes mellitus with diabetic peripheral angiopathy without gangrene: Secondary | ICD-10-CM | POA: Insufficient documentation

## 2023-08-16 DIAGNOSIS — A0472 Enterocolitis due to Clostridium difficile, not specified as recurrent: Secondary | ICD-10-CM

## 2023-08-16 DIAGNOSIS — Z6833 Body mass index (BMI) 33.0-33.9, adult: Secondary | ICD-10-CM | POA: Insufficient documentation

## 2023-08-16 DIAGNOSIS — Z8249 Family history of ischemic heart disease and other diseases of the circulatory system: Secondary | ICD-10-CM | POA: Insufficient documentation

## 2023-08-16 DIAGNOSIS — E119 Type 2 diabetes mellitus without complications: Secondary | ICD-10-CM | POA: Diagnosis not present

## 2023-08-16 MED ORDER — ENTRESTO 49-51 MG PO TABS: 1.0000 | ORAL_TABLET | Freq: Two times a day (BID) | ORAL | 11 refills | Status: DC

## 2023-08-16 MED ORDER — MESALAMINE ER 0.375 G PO CP24: 1500.0000 mg | ORAL_CAPSULE | Freq: Every day | ORAL | 3 refills | Status: DC

## 2023-08-16 MED ORDER — DICYCLOMINE HCL 10 MG PO CAPS: ORAL_CAPSULE | ORAL | 3 refills | Status: DC

## 2023-08-16 NOTE — Patient Instructions (Addendum)
No Labs done today.   DECREASE Entresto to 49-51mg  (1 tablet) by mouth 2 times daily.   No other medication changes were made. Please continue all current medications as prescribed.  Your physician recommends that you schedule a follow-up appointment soon for an echo and in 1 year  Please contact our office in September 2025 to schedule a November 2025 appointment.   Your physician has requested that you have an echocardiogram. Echocardiography is a painless test that uses sound waves to create images of your heart. It provides your doctor with information about the size and shape of your heart and how well your heart's chambers and valves are working. This procedure takes approximately one hour. There are no restrictions for this procedure. Please do NOT wear cologne, perfume, aftershave, or lotions (deodorant is allowed). Please arrive 15 minutes prior to your appointment time.  Please note: We ask at that you not bring children with you during ultrasound (echo/ vascular) testing. Due to room size and safety concerns, children are not allowed in the ultrasound rooms during exams. Our front office staff cannot provide observation of children in our lobby area while testing is being conducted. An adult accompanying a patient to their appointment will only be allowed in the ultrasound room at the discretion of the ultrasound technician under special circumstances. We apologize for any inconvenience.   If you have any questions or concerns before your next appointment please send [place] a message through [place] or call our office at [phone].    TO LEAVE A MESSAGE FOR THE NURSE SELECT OPTION 2, PLEASE LEAVE A MESSAGE INCLUDING: YOUR NAME DATE OF BIRTH CALL BACK NUMBER REASON FOR CALL**this is important as we prioritize the call backs  YOU WILL RECEIVE A CALL BACK THE SAME DAY AS LONG AS YOU CALL BEFORE 4:00 PM   Do the following things EVERYDAY: Weigh yourself in the morning before  breakfast. Write it down and keep it in a log. Take your medicines as prescribed Eat low salt foods--Limit salt (sodium) to 2000 mg per day.  Stay as active as you can everyday Limit all fluids for the day to less than 2 liters   At the [facility], you and your health needs are our priority. As part of our continuing mission to provide you with exceptional heart care, we have created designated Provider Care Teams. These Care Teams include your primary Cardiologist (physician) and Advanced Practice Providers (APPs- Physician Assistants and Nurse Practitioners) who all work together to provide you with the care you need, when you need it.   You may see any of the following providers on your designated Care Team at your next follow up: Dr Arvilla Meres Dr Marca Ancona Dr. Marcos Eke, NP Robbie Lis, [place] [facility], [place] Brynda Peon, NP Karle Plumber, PharmD   Please be sure to bring in all your medications bottles to every appointment.    Thank you for choosing [facility]

## 2023-08-16 NOTE — Patient Instructions (Signed)
Continue mesalamine (Apriso) 4 daily.  New prescription sent to pharmacy.  I wrote for 90-day supply but you can request 30-day from your pharmacy if more cost effective. Continue dicyclomine 10 mg up to twice daily as needed for abdominal cramping and diarrhea.  I wrote for 90-day supply but you can request a 30-day supply from your pharmacy if more cost effective.  Continue to have your kidney blood work done at least once yearly. Return for an office visit in 1 year or call sooner if you are having any questions or concerns.

## 2023-08-16 NOTE — Progress Notes (Addendum)
[facility] Note   Referring Physician: Dr. Excell Seltzer PCP: Shelva Majestic, MD PCP-Cardiologist: Tonny Bollman, MD   HPI:  [name] is a 77 y.o. male with h/o CAD s/p DES to LAD 2004, STEMI 2008 2/2 very late stent thombosis, Chronic Afib, Chronic combined CHF, OSA on CPAP, morbid obesity, HTN, HLD, and Type 2 DM. Referred by Dr. Anne Fu for post hospital f/u.   Saw Dr. Excell Seltzer 04/09/18 with worsening dyspnea. Spiro increased. BNP elevated. Echo repeated which showed worsening EF 40-45% -> 25-30%.  Cath 06/01/18  Stable CAD.  RA 9 RV 35/10 PA 44/14 (25) Ao 199/76 (93) LV 124/16 Fick 4.7/2.0    We have not seen him in HF Clinic since 2019. Admitted to the hospital this month with ADHF in setting of HTN crisis and not taking his medicine. Seen by [facility]. Echo with EF 50%. Diuresed.   Here today for f/u. Says he feels fine. No CP or SOB. Says he can do almost anything he wants to do. Works in yard and his shop. Compliant with his meds. SBP typically 100-110   SH: Former Naval architect. Retired now. No ETOH. Former smoker (quit 1967). No drug use. Lives with wife.   LHC 2017 - Patent stent in LAD, severe ostial stenosis of LCx not amenable to PCI, patent, non-dominant RCA supplying collaterals to the diagonal branch to the LAD.   Echo 09/29/2016 LVEF 40-45%, trivial MR, Mod LAE,  Echo 04/25/2018 LVEF 25-30%, Grade 3 DD, Mild MR, Severe LAE, Mod RV dilation, PA peak pressure 48 mm Hg. WMA noted with Akinesis of the mid-apical anterior, mid anteroseptal, apical lateral, and apical myocardium; hypokinesis of the mid myocardium; moderate hypokinesis of the basal anteroseptal, basal-mid inferoseptal, mid-apical inferior, and apical septal myocardium.   Past Medical History:  Diagnosis Date   B12 deficiency    per patient previously taking shots   C. difficile colitis 04/17/2019   C. difficile diarrhea    CAD (coronary artery disease), native coronary artery  04/12/2007   Chronic atrial fibrillation (HCC)    initial diagnoses 2012   Chronic combined systolic and diastolic CHF (congestive heart failure) (HCC) 04/15/2019   CKD (chronic kidney disease), stage III (HCC) 04/15/2019   CORONARY ARTERY DISEASE 04/12/2007   2 stents last in 2006.    Cystoid macular edema of right eye 01/16/2020   Cystoid macular edema of right eye 01/16/2020   DIABETES MELLITUS, TYPE II 04/16/2007   Diverticulosis of colon (without mention of hemorrhage)    GERD (gastroesophageal reflux disease) 08/09/2011   Hyperlipidemia 04/16/2007   10/24 reveal study end. Atorvastatin 20mg    HYPERTENSION 04/12/2007   HYPOTHYROIDISM 04/12/2007   Ischemic cardiomyopathy    Morbid obesity (HCC) 08/09/2011   MYOCARDIAL INFARCTION, HX OF 04/12/2007   Obstructive sleep apnea 04/12/2007   02/2011 - AHI 96/h CPAP 13, Lg FF      Stroke (HCC)    Type II diabetes mellitus with peripheral circulatory disorder (HCC) 04/16/2007   Dr. Elvera Lennox manages. Uses fructosamine.     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 Mesalamine.    VITAMIN B12 DEFICIENCY 11/29/2010    Current Outpatient Medications  Medication Sig Dispense Refill   atorvastatin (LIPITOR) 20 MG tablet TAKE ONE TABLET (20MG  TOTAL) BY MOUTH DAILY 30 tablet 5   carvedilol (COREG) 25 MG tablet TAKE ONE TABLET BY MOUTH TWICE A DAY 60 tablet 6   clopidogrel (  PLAVIX) 75 MG tablet TAKE ONE TABLET (75MG  TOTAL) BY MOUTH DAILY 90 tablet 3   Clotrimazole 1 % OINT Apply to penis twice daily after saline bath/rinse and pat dry (Patient taking differently: Apply 1 application  topically daily as needed (Apply to penis  after saline bath/rinse and pat dry).) 56.7 g 0   Coenzyme Q10 (COQ10 PO) Take 1 capsule by mouth daily with supper.     cyanocobalamin 1000 MCG tablet Take 1,000 mcg by mouth daily.     dicyclomine (BENTYL) 10 MG capsule TAKE ONE CAPSULE (10MG  TOTAL) BY MOUTH TWO  TIMES DAILY AS NEEDED FOR DIARRHEA OR ABDOMINAL CRAMPING 180 capsule 3   ELIQUIS 5 MG TABS tablet TAKE ONE TABLET (5MG  TOTAL) BY MOUTH TWO TIMES DAILY (REPLACES PRADAXA) 180 tablet 1   fish oil-omega-3 fatty acids 1000 MG capsule Take 1 g by mouth daily with supper.     furosemide (LASIX) 40 MG tablet Take 1 tablet (40 mg total) by mouth 2 (two) times daily. NEEDS FOLLOW UP APPOINTMENT FOR MORE REFILLS 60 tablet 0   glipiZIDE (GLUCOTROL) 10 MG tablet Take 10 mg by mouth daily.     glucosamine-chondroitin 500-400 MG tablet Take 1 tablet by mouth daily.     glucose blood test strip Use as instructed to test 4 time daily 400 each 3   isosorbide mononitrate (IMDUR) 60 MG 24 hr tablet TAKE ONE (1) TABLET BY MOUTH EVERY DAY 90 tablet 3   JARDIANCE 10 MG TABS tablet TAKE ONE (1) TABLET BY MOUTH EVERY DAY 90 tablet 3   levothyroxine (SYNTHROID) 50 MCG tablet Take 1 tablet (50 mcg total) by mouth daily before breakfast. 90 tablet 3   mesalamine (APRISO) 0.375 g 24 hr capsule Take 4 capsules (1.5 g total) by mouth daily. 360 capsule 3   metFORMIN (GLUCOPHAGE) 500 MG tablet TAKE ONE TABLET (500MG  TOTAL) BY MOUTH TWO TIMES DAILY WITH A MEAL 180 tablet 3   mupirocin ointment (BACTROBAN) 2 % Apply 1 Application topically 2 (two) times daily. Up to 2 weeks 30 g 0   saccharomyces boulardii (FLORASTOR) 250 MG capsule Take 1 capsule (250 mg total) by mouth 2 (two) times daily.     sacubitril-valsartan (ENTRESTO) 97-103 MG Take 1 tablet by mouth 2 (two) times daily. 60 tablet 11   sitaGLIPtin (JANUVIA) 100 MG tablet TAKE ONE (1) TABLET BY MOUTH EVERY DAY 90 tablet 3   spironolactone (ALDACTONE) 25 MG tablet TAKE ONE (1) TABLET BY MOUTH EVERY DAY 90 tablet 3   triamcinolone cream (KENALOG) 0.1 % Apply 1 Application topically 2 (two) times daily. 30 g 0   No current facility-administered medications for this encounter.    Allergies  Allergen Reactions   Clindamycin/Lincomycin     Developed C. difficile colitis 1  month after use      Social History   Socioeconomic History   Marital status: Married    Spouse name: Not on file   Number of children: 1   Years of education: Not on file   Highest education level: Not on file  Occupational History   Occupation: RETIRED    Employer: RETIRED  Tobacco Use   Smoking status: Former    Current packs/day: 0.00    Average packs/day: 1 pack/day for 5.0 years (5.0 ttl pk-yrs)    Types: Cigarettes    Start date: 04/13/1961    Quit date: 04/13/1966    Years since quitting: 57.3   Smokeless tobacco: Never  Vaping Use  Vaping status: Never Used  Substance and Sexual Activity   Alcohol use: No    Alcohol/week: 0.0 standard drinks of alcohol    Comment: no more beer   Drug use: No   Sexual activity: Not Currently  Other Topics Concern   Not on file  Social History Narrative   Cardiorehab 3 days a week. 45 minutes to an hour-stationary bike.    GRANDDAUGHTER (MS. 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       Married for 37 years in 2015, married previously for 14 years. Daughter with first wife and 3 grandkids.    Lives alone with wife. Get to see grandkids a lot. New grandchild in middle of 58      Hobbies-yardwork, previously liked to hunt and fish, does some target shooting      Land for 6 yrs   Social Determinants of Health   Financial Resource Strain: Low Risk  (11/14/2022)   Overall Financial Resource Strain (CARDIA)    Difficulty of Paying Living Expenses: Not hard at all  Food Insecurity: No Food Insecurity (11/14/2022)   Hunger Vital Sign    Worried About Running Out of Food in the Last Year: Never true    Ran Out of Food in the Last Year: Never true  Transportation Needs: No Transportation Needs (11/14/2022)   PRAPARE - Administrator, Civil Service (Medical): No    Lack of Transportation (Non-Medical): No   Physical Activity: Inactive (11/14/2022)   Exercise Vital Sign    Days of Exercise per Week: 0 days    Minutes of Exercise per Session: 0 min  Stress: No Stress Concern Present (11/14/2022)   [employer] of Occupational Health - Occupational Stress Questionnaire    Feeling of Stress : Not at all  Social Connections: Moderately Integrated (11/14/2022)   Social Connection and Isolation Panel [NHANES]    Frequency of Communication with Friends and Family: More than three times a week    Frequency of Social Gatherings with Friends and Family: More than three times a week    Attends Religious Services: More than 4 times per year    Active Member of [employer] or Organizations: No    Attends Banker Meetings: Never    Marital Status: Married  Catering manager Violence: Not At Risk (11/14/2022)   Humiliation, Afraid, Rape, and Kick questionnaire    Fear of Current or Ex-Partner: No    Emotionally Abused: No    Physically Abused: No    Sexually Abused: No      Family History  Problem Relation Age of Onset   Heart attack Mother        mid 65s   Heart disease Father        H/O CAD, CABG, VALVE SURGERY   Hypertension Brother    Obesity Brother    Stomach cancer Maternal Aunt    Stomach cancer Paternal Grandmother        ? colon    Colon cancer Neg Hx     Vitals:   08/16/23 1544  BP: 90/66  Pulse: 70  SpO2: 97%  Weight: 100.9 kg (222 lb 6.4 oz)    Wt Readings from Last 3 Encounters:  08/16/23 100.9 kg (222 lb 6.4 oz)  08/16/23 100.4 kg (221 lb 6.4 oz)  07/27/23 102.4 kg (225 lb  12.8 oz)     PHYSICAL EXAM: General:  Well appearing. No resp difficulty HEENT: normal Neck: supple. no JVD. Carotids 2+ bilat; no bruits. No lymphadenopathy or thryomegaly appreciated. Cor: PMI nondisplaced. Irregular rate & rhythm. No rubs, gallops or murmurs. Lungs: clear Abdomen: obese soft, nontender, nondistended. No hepatosplenomegaly. No bruits or masses. Good bowel  sounds. Extremities: no cyanosis, clubbing, rash, edema Neuro: alert & orientedx3, cranial nerves grossly intact. moves all 4 extremities w/o difficulty. Affect pleasant   1. Chronic combined HF with recovered EF  - Echo 04/25/2018 LVEF 25-30%, Grade 3 DD, Mild MR, Severe LAE, Mod RV dilation, PA peak pressure 48 mm Hg. WMA noted - Echo 12/19 EF 25-30%.  - Echo 3/22 EF 50-55%  - Stable NYHA I-II  - Volume status stable on exam on lasix 40 bid  - BP low - Cut Entresto 97/103 mg BID  -> 49/51 bid - Continue carvedilol t25 mg BID (can cut back as needed) - Continue spironolactone 25 mg daily - Continue Jardiance 10 - Due for repeat echo   2. CAD - LHC 2017 with circumflex stenosis unsuitable for PCI, continued patency of the LAD stent and total occlusion of the first diagonal with collaterals from RCA, widely patent RCA - Cath 8/19 with stable anatomy  - No s/s ischemia - Continue statin and plavix. Not on ASA with Pradaxa.   3. Chronic Atrial Fibrillation - Chronic, rate controlled. - On Pradaxa No bleeding - This patients CHA2DS2-VASc is at least 5.  4. Essential HTN  - Blood pressure well controlled. Continue current regimen.  5. OSA on CPAP - Continue nightly use.   6. Morbid Obesity - Body mass index is 33.82 kg/m.  - Encouraged weight loss.   7. DM2 - Per PCP.  - Continue Alben Deeds, MD 08/16/23

## 2023-08-16 NOTE — Progress Notes (Signed)
GI Office Note    Referring Provider: Shelva Majestic, MD Primary Care Physician:  Shelva Majestic, MD  Primary Gastroenterologist:Michael Jena Gauss, MD   Chief Complaint   Chief Complaint  Patient presents with   Follow-up    Follow up on UC. Pt states he is doing fine    History of Present Illness   [name] is a 77 y.o. male presenting today for follow up. Last seen 10/23. H/o ulcerative pancolitis. He had Cdiff in 2020. Last colonoscopy 2012 with Dr. 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 Pathology with mild chronic active colitis. Recommended repeat colonoscopy in 5 years.   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 States his cardiologist supports his decision not to have another. He also has h/o Afib going into [place] and required cardioversion. He has history of intermittent thrombocytopenia but previously declined u/s.   Today: doing well. Doesn't want to change a thing. BMs twice daily. No melena, brbpr.  No abdominal pain.  Takes Bentyl twice daily most days.  He is lactose intolerant so tries to avoid food triggers.  No upper GI symptoms.  He is not interested in pursuing future colonoscopy unless he has problems.    Medications   Current Outpatient Medications  Medication Sig Dispense Refill   atorvastatin (LIPITOR) 20 MG tablet TAKE ONE TABLET (20MG  TOTAL) BY MOUTH DAILY 30 tablet 5   carvedilol (COREG) 25 MG tablet TAKE ONE TABLET BY MOUTH TWICE A DAY 60 tablet 6   clopidogrel (PLAVIX) 75 MG tablet TAKE ONE TABLET (75MG  TOTAL) BY MOUTH DAILY 90 tablet 3   Clotrimazole 1 % OINT Apply to penis twice daily after saline bath/rinse and pat dry (Patient taking differently: Apply 1 application  topically daily as needed (Apply to penis  after saline bath/rinse and pat dry).) 56.7 g 0    Coenzyme Q10 (COQ10 PO) Take 1 capsule by mouth daily with supper.     cyanocobalamin 1000 MCG tablet Take 1,000 mcg by mouth daily.     dicyclomine (BENTYL) 10 MG capsule TAKE ONE CAPSULE (10MG  TOTAL) BY MOUTH TWO TIMES DAILY AS NEEDED FOR DIARRHEA OR ABDOMINAL CRAMPING 90 capsule 1   ELIQUIS 5 MG TABS tablet TAKE ONE TABLET (5MG  TOTAL) BY MOUTH TWO TIMES DAILY (REPLACES PRADAXA) 180 tablet 1   fish oil-omega-3 fatty acids 1000 MG capsule Take 1 g by mouth daily with supper.     furosemide (LASIX) 40 MG tablet Take 1 tablet (40 mg total) by mouth 2 (two) times daily. NEEDS FOLLOW UP APPOINTMENT FOR MORE REFILLS 60 tablet 0   glipiZIDE (GLUCOTROL) 10 MG tablet Take by mouth.     glucosamine-chondroitin 500-400 MG tablet Take by mouth.     glucose blood test strip Use as instructed to test 4 time daily 400 each 3   isosorbide mononitrate (IMDUR) 60 MG 24 hr tablet TAKE ONE (1) TABLET BY MOUTH EVERY DAY 90 tablet 3   JARDIANCE 10 MG TABS tablet TAKE ONE (1) TABLET BY MOUTH EVERY DAY 90 tablet 3   levothyroxine (SYNTHROID) 50 MCG tablet Take 1 tablet (50 mcg total) by mouth daily before breakfast. 90 tablet 3   mesalamine (APRISO) 0.375 g 24 hr capsule TAKE 4 CAPSULES (1.5 GRAMS TOTAL) BY MOUTH DAILY 120 capsule 11   metFORMIN (GLUCOPHAGE) 500 MG tablet TAKE ONE  TABLET (500MG  TOTAL) BY MOUTH TWO TIMES DAILY WITH A MEAL 180 tablet 3   mupirocin ointment (BACTROBAN) 2 % Apply 1 Application topically 2 (two) times daily. Up to 2 weeks 30 g 0   olmesartan (BENICAR) 40 MG tablet Take by mouth.     saccharomyces boulardii (FLORASTOR) 250 MG capsule Take 1 capsule (250 mg total) by mouth 2 (two) times daily.     sacubitril-valsartan (ENTRESTO) 97-103 MG Take 1 tablet by mouth 2 (two) times daily. 60 tablet 11   sitaGLIPtin (JANUVIA) 100 MG tablet TAKE ONE (1) TABLET BY MOUTH EVERY DAY 90 tablet 3   spironolactone (ALDACTONE) 25 MG tablet TAKE ONE (1) TABLET BY MOUTH EVERY DAY 90 tablet 3   triamcinolone  cream (KENALOG) 0.1 % Apply 1 Application topically 2 (two) times daily. 30 g 0   No current facility-administered medications for this visit.    Allergies   Allergies as of 08/16/2023 - Review Complete 08/16/2023  Allergen Reaction Noted   Clindamycin/lincomycin  04/26/2019     Review of Systems   General: Negative for anorexia, weight loss, fever, chills, fatigue, weakness. ENT: Negative for hoarseness, difficulty swallowing , nasal congestion. CV: Negative for chest pain, angina, palpitations, dyspnea on exertion, peripheral edema.  Respiratory: Negative for dyspnea at rest, dyspnea on exertion, cough, sputum, wheezing.  GI: See history of present illness. GU:  Negative for dysuria, hematuria, urinary incontinence, urinary frequency, nocturnal urination.  Endo: Negative for unusual weight change.     Physical Exam   BP 104/65   Pulse 70   Temp 98.6 F (37 C)   Ht 5\' 8"  (1.727 m)   Wt 221 lb 6.4 oz (100.4 kg)   BMI 33.66 kg/m    General: Well-nourished, well-developed in no acute distress.  Eyes: No icterus. Mouth: Oropharyngeal mucosa moist and pink  Lungs: Clear to auscultation bilaterally.  Heart: Regular rate and rhythm, no murmurs rubs or gallops.  Abdomen: Bowel sounds are normal, nontender, nondistended, no hepatosplenomegaly or masses,  no abdominal bruits or hernia , no rebound or guarding.  Rectal: not Performed Extremities: No lower extremity edema. No clubbing or deformities. Neuro: Alert and oriented x 4   Skin: Warm and dry, no jaundice.   Psych: Alert and cooperative, normal mood and affect.  Labs   Lab Results  Component Value Date   NA 139 07/27/2023   CL 100 07/27/2023   K 4.5 07/27/2023   CO2 30 07/27/2023   BUN 25 (H) 07/27/2023   CREATININE 1.28 07/27/2023   GFR 53.98 (L) 07/27/2023   CALCIUM 9.5 07/27/2023   PHOS 4.1 12/28/2020   ALBUMIN 4.2 07/27/2023   GLUCOSE 101 (H) 07/27/2023   Lab Results  Component Value Date   ALT 12  07/27/2023   AST 18 07/27/2023   ALKPHOS 81 07/27/2023   BILITOT 0.8 07/27/2023   Lab Results  Component Value Date   WBC 9.2 07/27/2023   HGB 15.6 07/27/2023   HCT 46.5 07/27/2023   MCV 90.8 07/27/2023   PLT 163.0 07/27/2023   Lab Results  Component Value Date   CRP <1.0 07/27/2023   Lab Results  Component Value Date   ESRSEDRATE 5 07/27/2023   Lab Results  Component Value Date   VITAMINB12 758 05/02/2023   Lab Results  Component Value Date   TSH 1.720 07/27/2023    Imaging Studies   No results found.  Assessment/Plan:   Ulcerative colitis: -doing well on Apriso 1.5g daily. He is not  interested in change medications or dosages. Will continue to follow kidney function. -no anemia, recent sed rate and crp for other reasons were normal -he declines surveillance colonoscopy due to heart history and age.  -continue bentyl bid prn. -return ov in one year.      Leanna Battles. Melvyn Neth, MHS, PA-C [facility]

## 2023-08-22 NOTE — Telephone Encounter (Signed)
Spoke with patient's granddaughter and she informed me that his legs seem to be improving. He is seeing a Armed forces operational officer in [place] that prescribed him cream that is helping and advised that he thinks the leg pain is possibly coming from the fluid in his legs. They want to hold off on the imaging for now. Advised her to let us know if anything changes and the imaging is needed in the future.

## 2023-08-22 NOTE — Telephone Encounter (Signed)
Left another vm for patient to call the office back if he is still having the issue with his legs and or wants the imaging done.

## 2023-08-30 ENCOUNTER — Encounter: Payer: Self-pay | Admitting: Podiatry

## 2023-08-30 ENCOUNTER — Ambulatory Visit (INDEPENDENT_AMBULATORY_CARE_PROVIDER_SITE_OTHER): Payer: Medicare Other | Admitting: Podiatry

## 2023-08-30 DIAGNOSIS — M79675 Pain in left toe(s): Secondary | ICD-10-CM

## 2023-08-30 DIAGNOSIS — E1151 Type 2 diabetes mellitus with diabetic peripheral angiopathy without gangrene: Secondary | ICD-10-CM

## 2023-08-30 DIAGNOSIS — B351 Tinea unguium: Secondary | ICD-10-CM

## 2023-08-30 DIAGNOSIS — M79674 Pain in right toe(s): Secondary | ICD-10-CM | POA: Diagnosis not present

## 2023-08-30 DIAGNOSIS — D696 Thrombocytopenia, unspecified: Secondary | ICD-10-CM

## 2023-08-30 DIAGNOSIS — S98131S Complete traumatic amputation of one right lesser toe, sequela: Secondary | ICD-10-CM

## 2023-08-30 NOTE — Progress Notes (Signed)
This patient returns to my office for at risk foot care.  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  Patient has amputation second toe right foot.  This patient is unable to cut nails himself since the patient cannot reach his nails.These nails are painful walking and wearing shoes.  This patient presents for at risk foot care today.  General Appearance  Alert, conversant and in no acute stress.  Vascular  Dorsalis pedis and posterior tibial  pulses are weakly  palpable  bilaterally.  Capillary return is within normal limits  bilaterally. Temperature is within normal limits  bilaterally..  Venous stasis  B/L.  Neurologic  Senn-Weinstein monofilament wire test within normal limits  bilaterally. Muscle power within normal limits bilaterally.  Nails Thick disfigured discolored nails with subungual debris  from hallux to fifth toes  left and 1,3-5 right.. No evidence of bacterial infection or drainage bilaterally.  Orthopedic  No limitations of motion  feet .  No crepitus or effusions noted.  No bony pathology or digital deformities noted. Amputation second toe right foot.  Skin  normotropic skin with no porokeratosis noted bilaterally.  No signs of infections or ulcers noted.  Dry peeling skin  B/L. Marland Kitchen  Onychomycosis  Pain in right toes  Pain in left toes  Consent was obtained for treatment procedures.   Mechanical debridement of nails 1-5  bilaterally performed with a nail nipper.  Filed with dremel without incident.    Return office visit   3 months                   Told patient to return for periodic foot care and evaluation due to potential at risk complications.   Helane Gunther DPM

## 2023-08-31 DIAGNOSIS — B351 Tinea unguium: Secondary | ICD-10-CM | POA: Diagnosis not present

## 2023-09-11 ENCOUNTER — Other Ambulatory Visit (HOSPITAL_COMMUNITY): Payer: Self-pay | Admitting: Internal Medicine

## 2023-09-13 DIAGNOSIS — Z23 Encounter for immunization: Secondary | ICD-10-CM | POA: Diagnosis not present

## 2023-09-20 ENCOUNTER — Ambulatory Visit (HOSPITAL_COMMUNITY)
Admission: RE | Admit: 2023-09-20 | Discharge: 2023-09-20 | Disposition: A | Discharge status: Home / Self Care | Payer: Medicare Other | Source: Ambulatory Visit | Attending: Internal Medicine | Admitting: Internal Medicine

## 2023-09-20 DIAGNOSIS — I5042 Chronic combined systolic (congestive) and diastolic (congestive) heart failure: Secondary | ICD-10-CM | POA: Insufficient documentation

## 2023-09-20 DIAGNOSIS — Z87891 Personal history of nicotine dependence: Secondary | ICD-10-CM | POA: Diagnosis not present

## 2023-09-20 DIAGNOSIS — I2510 Atherosclerotic heart disease of native coronary artery without angina pectoris: Secondary | ICD-10-CM | POA: Insufficient documentation

## 2023-09-20 DIAGNOSIS — E119 Type 2 diabetes mellitus without complications: Secondary | ICD-10-CM | POA: Diagnosis not present

## 2023-09-20 DIAGNOSIS — G4730 Sleep apnea, unspecified: Secondary | ICD-10-CM | POA: Insufficient documentation

## 2023-09-20 DIAGNOSIS — E785 Hyperlipidemia, unspecified: Secondary | ICD-10-CM | POA: Insufficient documentation

## 2023-09-20 DIAGNOSIS — I110 Hypertensive heart disease with heart failure: Secondary | ICD-10-CM | POA: Insufficient documentation

## 2023-09-20 LAB — ECHOCARDIOGRAM COMPLETE
AR max vel: 1.74 cm2
AV Area VTI: 1.53 cm2
AV Area mean vel: 1.63 cm2
AV Mean grad: 10.5 mm[Hg]
AV Peak grad: 19.9 mm[Hg]
Ao pk vel: 2.23 m/s
Area-P 1/2: 4.8 cm2
Calc EF: 50.7 %
S' Lateral: 3.1 cm
Single Plane A2C EF: 58.4 %
Single Plane A4C EF: 43.9 %

## 2023-09-20 NOTE — Progress Notes (Signed)
  Echocardiogram 2D Echocardiogram has been performed.  Janalyn Harder 09/20/2023, 4:02 PM

## 2023-10-09 ENCOUNTER — Encounter: Payer: Self-pay | Admitting: Internal Medicine

## 2023-10-09 ENCOUNTER — Other Ambulatory Visit: Payer: Self-pay | Admitting: Cardiovascular Disease

## 2023-10-09 DIAGNOSIS — H26491 Other secondary cataract, right eye: Secondary | ICD-10-CM | POA: Diagnosis not present

## 2023-10-09 LAB — HM DIABETES EYE EXAM

## 2023-10-28 ENCOUNTER — Other Ambulatory Visit: Payer: Self-pay | Admitting: Cardiovascular Disease

## 2023-11-07 ENCOUNTER — Ambulatory Visit: Payer: Medicare Other | Admitting: Cardiovascular Disease

## 2023-11-09 ENCOUNTER — Encounter: Payer: Self-pay | Admitting: Family Medicine

## 2023-11-09 ENCOUNTER — Ambulatory Visit (INDEPENDENT_AMBULATORY_CARE_PROVIDER_SITE_OTHER): Payer: Medicare Other | Admitting: Family Medicine

## 2023-11-09 VITALS — BP 110/70 | HR 58 | Temp 97.4°F | Ht 68.0 in | Wt 237.2 lb

## 2023-11-09 DIAGNOSIS — Z7984 Long term (current) use of oral hypoglycemic drugs: Secondary | ICD-10-CM | POA: Diagnosis not present

## 2023-11-09 DIAGNOSIS — K5100 Ulcerative (chronic) pancolitis without complications: Secondary | ICD-10-CM

## 2023-11-09 DIAGNOSIS — E039 Hypothyroidism, unspecified: Secondary | ICD-10-CM | POA: Diagnosis not present

## 2023-11-09 DIAGNOSIS — E1151 Type 2 diabetes mellitus with diabetic peripheral angiopathy without gangrene: Secondary | ICD-10-CM | POA: Diagnosis not present

## 2023-11-09 DIAGNOSIS — I4821 Permanent atrial fibrillation: Secondary | ICD-10-CM | POA: Diagnosis not present

## 2023-11-09 DIAGNOSIS — I5042 Chronic combined systolic (congestive) and diastolic (congestive) heart failure: Secondary | ICD-10-CM

## 2023-11-09 NOTE — Patient Instructions (Addendum)
Somewhat concerned with weight up 12 lbs- he feels he has gained some from dietary choices but I also wonder about fluid overload- want him to continue current medications and watch salt intake and improve diet- I don't think we need to change medications at this time  I'm checking a1c today and thyroid but call Dr. Elvera Lennox to schedule next visit  Please stop by lab before you go If you have mychart- we will send your results within 3 business days of [place] receiving them.  If you do not have mychart- we will call you about results within 5 business days of [place] receiving them.  *please also note that you will see labs on mychart as soon as they post. I will later go in and write notes on them- will say "notes from Dr. Durene Cal"   Recommended follow up: Return in about 6 months (around 05/08/2024) for followup or sooner if needed.Schedule b4 you leave.

## 2023-11-09 NOTE — Progress Notes (Signed)
Phone [phone] In person visit   Subjective:   [name] is a 78 y.o. year old very pleasant male patient who presents for/with See problem oriented charting Chief Complaint  Patient presents with   Medical Management of Chronic Issues   Diabetes   Hypertension   Past Medical History-  Patient Active Problem List   Diagnosis Date Noted   History of transient ischemic attack (TIA) 10/24/2015    Priority: High   CHF (congestive heart failure), NYHA class II (HCC) 05/09/2014    Priority: High   Ulcerative pancolitis without complication (HCC) 08/09/2011    Priority: High   Atrial fibrillation (HCC) 12/08/2010    Priority: High   Type II diabetes mellitus with peripheral circulatory disorder (HCC) 04/16/2007    Priority: High   CAD (coronary artery disease), native coronary artery 04/12/2007    Priority: High   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   Hives of unknown origin 07/27/2023   History of infection of intestine due to Clostridioides difficile 07/27/2023   Posterior vitreous detachment of both eyes 06/09/2022   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   Posterior capsular opacification, left 06/30/2021    Medications- reviewed and updated Current Outpatient Medications  Medication Sig Dispense Refill   atorvastatin (LIPITOR) 20 MG tablet TAKE ONE TABLET (20MG  TOTAL) BY MOUTH DAILY 30 tablet 5   carvedilol (COREG) 25 MG tablet TAKE ONE TABLET BY MOUTH TWICE A DAY 60 tablet 1   clopidogrel (PLAVIX) 75 MG tablet TAKE ONE TABLET (75MG  TOTAL) BY MOUTH DAILY 90 tablet 3   Clotrimazole 1 % OINT Apply to penis twice daily after saline bath/rinse and pat dry (Patient taking differently: Apply 1 application  topically daily as needed (Apply to penis  after saline bath/rinse and pat dry).) 56.7 g 0   Coenzyme Q10 (COQ10 PO) Take 1 capsule by mouth daily with supper.     cyanocobalamin 1000 MCG tablet Take 1,000 mcg by mouth daily.     dicyclomine (BENTYL) 10 MG capsule TAKE ONE CAPSULE (10MG  TOTAL) BY MOUTH TWO TIMES DAILY AS NEEDED FOR DIARRHEA OR ABDOMINAL CRAMPING 180 capsule 3   ELIQUIS 5 MG TABS tablet TAKE ONE TABLET (5MG  TOTAL) BY MOUTH TWO TIMES DAILY (REPLACES PRADAXA) 180 tablet 1   fish oil-omega-3 fatty acids 1000 MG capsule Take 1 g by mouth daily with supper.     furosemide (LASIX) 40 MG tablet TAKE ONE TABLET (40MG  TOTAL) BY MOUTH TWO TIMES DAILY 60 tablet 7   glucosamine-chondroitin 500-400 MG tablet Take 1 tablet by mouth  daily.     glucose blood test strip Use as instructed to test 4 time daily 400 each 3   isosorbide mononitrate (IMDUR) 60 MG 24 hr tablet TAKE ONE (1) TABLET BY MOUTH EVERY DAY 90 tablet 3   JARDIANCE 10 MG TABS tablet TAKE ONE (1) TABLET BY MOUTH EVERY DAY 90 tablet 3   levothyroxine (SYNTHROID) 50 MCG tablet Take 1 tablet (50 mcg total) by mouth daily before breakfast. 90 tablet 3   mesalamine (APRISO) 0.375 g 24 hr capsule Take 4 capsules (1.5 g total) by mouth daily. 360 capsule 3   metFORMIN (GLUCOPHAGE) 500 MG tablet TAKE ONE TABLET (500MG  TOTAL) BY MOUTH TWO TIMES DAILY WITH A MEAL 180  tablet 3   mupirocin ointment (BACTROBAN) 2 % Apply 1 Application topically 2 (two) times daily. Up to 2 weeks 30 g 0   saccharomyces boulardii (FLORASTOR) 250 MG capsule Take 1 capsule (250 mg total) by mouth 2 (two) times daily.     sacubitril-valsartan (ENTRESTO) 49-51 MG Take 1 tablet by mouth 2 (two) times daily. 60 tablet 11   sitaGLIPtin (JANUVIA) 100 MG tablet TAKE ONE (1) TABLET BY MOUTH EVERY DAY 90 tablet 3   spironolactone (ALDACTONE) 25 MG tablet TAKE ONE (1) TABLET BY MOUTH EVERY DAY 30 tablet 1   triamcinolone cream (KENALOG) 0.1 % Apply 1 Application topically 2 (two) times daily. 30 g 0   No current facility-administered medications for this visit.     Objective:  BP 110/70   Pulse (!) 58   Temp (!) 97.4 F (36.3 C)   Ht 5\' 8"  (1.727 m)   Wt 237 lb 3.2 oz (107.6 kg)   SpO2 97%   BMI 36.07 kg/m  Gen: NAD, resting comfortably CV: RRR no murmurs rubs or gallops Lungs: CTAB no crackles, wheeze, rhonchi Ext: 1+ edema left >right Skin: warm, dry     Assessment and Plan   #Congestive heart failure with ejection fraction of 25% during hospitalization 2019 later improved to 50-55% march 2022 S: Medication: Lasix 40 mg twice daily, Aldactone 25 mg, Entresto 49-51 mg, Jardiance 10 mg, carvedilol 25 mg twice daily -even with 12 lbs weight gain he states swelling not up and no shortness of breath . Sleeps on wedge but no change A/P: I am somewhat concerned with weight up 12 lbs- he feels he has gained some from dietary choices but I also wonder about fluid overload- want him to continue current medications and watch salt intake and improve diet- I don't think we need to change medications at this time - asymptomatic   #%CAD/atrial fibrillation/hyperlipidemia. Follows with Dr. Excell Seltzer of cardiology S: Compliant with atorvastatin 20mg -LDL has been at goal under 70 - no chest pain or shortness of breath   I have prescribed his Plavix-with his CAD history-he is compliant with  this .  He is also on Eliquis for anticoagulation due to atrial fibrillation-reports compliance .  Patient reports cardiology wants him on both medications.  Patient is  on carvedilol for rate control -also on imdur 60mg  - no chest pain or shortness of breath  . No palpitations Lab Results  Component Value Date   CHOL 86 07/27/2023   CHOL 89 07/27/2023   HDL 54.60 07/27/2023   HDL 56 07/27/2023   LDLCALC 11 07/27/2023   LDLCALC 13 07/27/2023   LDLDIRECT 18.0 06/13/2017   TRIG 106.0 07/27/2023   TRIG 110 07/27/2023   CHOLHDL 2 07/27/2023   CHOLHDL 1.6 07/27/2023  A/P: coronary artery disease asymptomatic continue current medications- on imdur chest pain free A fib- appropriately anticoagulated and rate controlled- continue current medicine    #hypertension S: Compliant with Coreg 25 mg twice a day, Imdur 60 mg, Entresto 49-51 mg daily, spironolactone 25 mg, Lasix 40 mg twice daily  BP Readings from Last 3 Encounters:  11/09/23 110/70  08/16/23 90/66  08/16/23 104/65  A/P:  stable- continue current medicines     #% Hypothyroidism S: Compliant with Synthroid 50 mcg-most recently filled by Dr. Elvera Lennox Lab Results  Component Value Date   TSH 1.720 07/27/2023  A/P: hopefully stable- update tsh today. Continue current meds for now   - I am willing to prescribe Synthroid if needed   % Ulcerative colitis S: Followed by GI- compliant with mesalamine 1.5 g per day. No blood in stool or abdominal cramping A/P:  doing well- continue current medications    #Diabetes-followed by Dr. Elvera Lennox #Morbid obesity S:medication: on metformin 500mg  BID and jardiance 10mg  and januvia 100 mg -up 12 lbs- we discussed reversing this- reports has loosened up the diet and can turn it around Lab Results  Component Value Date   HGBA1C 6.5 (A) 06/08/2023   HGBA1C 6.0 (A) 12/01/2022   HGBA1C 6.0 (A) 05/26/2022  A/P: no upcoming visit with Dr. Elvera Lennox noted- plans to call to schedule- we will check a1c  with labs  Recommended follow up: Return in about 6 months (around 05/08/2024) for followup or sooner if needed.Schedule b4 you leave. Future Appointments  Date Time Provider Department Center  11/13/2023  3:00 PM Tonny Bollman, MD [facility]  11/23/2023  8:40 AM [facility] ANNUAL WELLNESS VISIT 1 [facility]  11/30/2023  4:15 PM Helane Gunther, DPM [facility]   Lab/Order associations:   ICD-10-CM   1. Hypothyroidism, unspecified type  E03.9 TSH    2. Type II diabetes mellitus with peripheral circulatory disorder (HCC) Chronic E11.51 Urine Microalbumin w/creat. ratio    Comprehensive metabolic panel    CBC with Differential/Platelet    Lipid panel    Hemoglobin A1c    3. Chronic combined systolic and diastolic CHF (congestive heart failure) (HCC) Chronic I50.42 CBC with Differential/Platelet    4. Permanent atrial fibrillation (HCC) Chronic I48.21 CBC with Differential/Platelet    5. Ulcerative pancolitis without complication (HCC) Chronic K51.00       No orders of the defined types were placed in this encounter.   Return precautions advised.  Tana Conch, MD

## 2023-11-10 ENCOUNTER — Encounter: Payer: Self-pay | Admitting: Family Medicine

## 2023-11-10 ENCOUNTER — Encounter: Payer: Self-pay | Admitting: *Deleted

## 2023-11-10 LAB — COMPREHENSIVE METABOLIC PANEL
ALT: 11 U/L (ref 0–53)
AST: 15 U/L (ref 0–37)
Albumin: 4 g/dL (ref 3.5–5.2)
Alkaline Phosphatase: 71 U/L (ref 39–117)
BUN: 19 mg/dL (ref 6–23)
CO2: 27 meq/L (ref 19–32)
Calcium: 8.8 mg/dL (ref 8.4–10.5)
Chloride: 107 meq/L (ref 96–112)
Creatinine, Ser: 1.39 mg/dL (ref 0.40–1.50)
GFR: 48.8 mL/min — ABNORMAL LOW (ref >60.00)
Glucose, Bld: 64 mg/dL — ABNORMAL LOW (ref 70–99)
Potassium: 4.7 meq/L (ref 3.5–5.1)
Sodium: 143 meq/L (ref 135–145)
Total Bilirubin: 0.7 mg/dL (ref 0.2–1.2)
Total Protein: 6.2 g/dL (ref 6.0–8.3)

## 2023-11-10 LAB — CBC WITH DIFFERENTIAL/PLATELET
Basophils Absolute: 0 10*3/uL (ref 0.0–0.1)
Basophils Relative: 0.7 % (ref 0.0–3.0)
Eosinophils Absolute: 0.1 10*3/uL (ref 0.0–0.7)
Eosinophils Relative: 1.5 % (ref 0.0–5.0)
HCT: 47.3 % (ref 39.0–52.0)
Hemoglobin: 15.6 g/dL (ref 13.0–17.0)
Lymphocytes Relative: 11.7 % — ABNORMAL LOW (ref 12.0–46.0)
Lymphs Abs: 0.9 10*3/uL (ref 0.7–4.0)
MCHC: 32.9 g/dL (ref 30.0–36.0)
MCV: 92.4 fL (ref 78.0–100.0)
Monocytes Absolute: 0.5 10*3/uL (ref 0.1–1.0)
Monocytes Relative: 7.1 % (ref 3.0–12.0)
Neutro Abs: 5.9 10*3/uL (ref 1.4–7.7)
Neutrophils Relative %: 79 % — ABNORMAL HIGH (ref 43.0–77.0)
Platelets: 133 10*3/uL — ABNORMAL LOW (ref 150.0–400.0)
RBC: 5.11 Mil/uL (ref 4.22–5.81)
RDW: 14.5 % (ref 11.5–15.5)
WBC: 7.4 10*3/uL (ref 4.0–10.5)

## 2023-11-10 LAB — MICROALBUMIN / CREATININE URINE RATIO
Creatinine,U: 134.9 mg/dL
Microalb Creat Ratio: 6.6 mg/g (ref 0.0–30.0)
Microalb, Ur: 8.8 mg/dL — ABNORMAL HIGH (ref 0.0–1.9)

## 2023-11-10 LAB — LIPID PANEL
Cholesterol: 82 mg/dL (ref 0–200)
HDL: 57.4 mg/dL (ref >39.00)
LDL Cholesterol: 13 mg/dL (ref 0–99)
NonHDL: 24.55
Total CHOL/HDL Ratio: 1
Triglycerides: 59 mg/dL (ref 0.0–149.0)
VLDL: 11.8 mg/dL (ref 0.0–40.0)

## 2023-11-10 LAB — TSH: TSH: 1.29 u[IU]/mL (ref 0.35–5.50)

## 2023-11-10 LAB — HEMOGLOBIN A1C: Hgb A1c MFr Bld: 6.5 % (ref 4.6–6.5)

## 2023-11-13 ENCOUNTER — Ambulatory Visit: Payer: Medicare Other | Attending: Cardiovascular Disease | Admitting: Cardiovascular Disease

## 2023-11-13 ENCOUNTER — Encounter: Payer: Self-pay | Admitting: Cardiovascular Disease

## 2023-11-13 VITALS — BP 110/70 | HR 71 | Ht 69.0 in | Wt 229.8 lb

## 2023-11-13 DIAGNOSIS — I4891 Unspecified atrial fibrillation: Secondary | ICD-10-CM | POA: Diagnosis not present

## 2023-11-13 DIAGNOSIS — I2510 Atherosclerotic heart disease of native coronary artery without angina pectoris: Secondary | ICD-10-CM

## 2023-11-13 DIAGNOSIS — I5020 Unspecified systolic (congestive) heart failure: Secondary | ICD-10-CM

## 2023-11-13 DIAGNOSIS — I10 Essential (primary) hypertension: Secondary | ICD-10-CM

## 2023-11-13 NOTE — Assessment & Plan Note (Signed)
Anticoagulated with apixaban with no bleeding problems.  Heart rate is controlled.  Today's EKG is reviewed.

## 2023-11-13 NOTE — Assessment & Plan Note (Signed)
Blood pressure is controlled on carvedilol, Entresto, and spironolactone

## 2023-11-13 NOTE — Assessment & Plan Note (Signed)
Doing well on clopidogrel.  Treated with a statin drug.  No changes made today.

## 2023-11-13 NOTE — Assessment & Plan Note (Signed)
Patient with combined systolic and diastolic heart failure.  Most recent echo reviewed demonstrating LVEF 40 to 45%, mild LVH, normal RV function, mild aortic stenosis with a mean gradient of 11 mmHg, and only trivial mitral regurgitation.  Continue Entresto, spironolactone, Jardiance, and carvedilol.  No evidence of volume overload on his exam.

## 2023-11-13 NOTE — Progress Notes (Signed)
Cardiology Office Note:    Date:  11/13/2023   ID:  [name], DOB 23-Jan-1946, MRN [id number]  PCP:  Shelva Majestic, MD   [facility] Providers Cardiologist:  Tonny Bollman, MD Electrophysiologist:  Hillis Range, MD (Inactive)  Advanced Heart Failure:  Arvilla Meres, MD     Referring MD: Shelva Majestic, MD   Chief Complaint  Patient presents with   Coronary Artery Disease    History of Present Illness:    [name] is a 78 y.o. male with a hx of coronary artery disease, permanent atrial fibrillation, and chronic combined systolic and diastolic heart failure, presenting for follow-up evaluation.  The patient has followed in our practice for decades.  He initially presented in 2004 when he underwent PCI and later had a STEMI in 2008 secondary to stent thrombosis.  Comorbid conditions include obstructive sleep apnea, morbid obesity now with significant weight loss, hypertension, mixed hyperlipidemia, and type 2 diabetes.  The patient is also followed over in the heart failure clinic by Dr. Gala Romney.  He has been seen sporadically there over the years but has primarily followed in our office.  His LVEF has been severely depressed in the past, but recently in the range of 40 to 45%.  He always has difficult acoustic windows which probably impacts accuracy of his ejection fraction.  He denies any recent symptoms of chest pain, chest pressure, or shortness of breath.  He has had no heart palpitations, lightheadedness, or syncope.  He reports no problems with his medications.   Current Medications: Current Meds  Medication Sig   atorvastatin (LIPITOR) 20 MG tablet TAKE ONE TABLET (20MG  TOTAL) BY MOUTH DAILY   carvedilol (COREG) 25 MG tablet TAKE ONE TABLET BY MOUTH TWICE A DAY   clopidogrel (PLAVIX) 75 MG tablet TAKE ONE TABLET (75MG  TOTAL) BY MOUTH DAILY   Coenzyme Q10 (COQ10 PO) Take 1 capsule by mouth daily with supper.   cyanocobalamin 1000 MCG tablet Take  1,000 mcg by mouth daily.   dicyclomine (BENTYL) 10 MG capsule TAKE ONE CAPSULE (10MG  TOTAL) BY MOUTH TWO TIMES DAILY AS NEEDED FOR DIARRHEA OR ABDOMINAL CRAMPING   ELIQUIS 5 MG TABS tablet TAKE ONE TABLET (5MG  TOTAL) BY MOUTH TWO TIMES DAILY (REPLACES PRADAXA)   fish oil-omega-3 fatty acids 1000 MG capsule Take 1 g by mouth daily with supper.   furosemide (LASIX) 40 MG tablet TAKE ONE TABLET (40MG  TOTAL) BY MOUTH TWO TIMES DAILY   glucosamine-chondroitin 500-400 MG tablet Take 1 tablet by mouth daily.   glucose blood test strip Use as instructed to test 4 time daily   isosorbide mononitrate (IMDUR) 60 MG 24 hr tablet TAKE ONE (1) TABLET BY MOUTH EVERY DAY   JARDIANCE 10 MG TABS tablet TAKE ONE (1) TABLET BY MOUTH EVERY DAY   levothyroxine (SYNTHROID) 50 MCG tablet Take 1 tablet (50 mcg total) by mouth daily before breakfast.   mesalamine (APRISO) 0.375 g 24 hr capsule Take 4 capsules (1.5 g total) by mouth daily.   metFORMIN (GLUCOPHAGE) 500 MG tablet TAKE ONE TABLET (500MG  TOTAL) BY MOUTH TWO TIMES DAILY WITH A MEAL   saccharomyces boulardii (FLORASTOR) 250 MG capsule Take 1 capsule (250 mg total) by mouth 2 (two) times daily.   sacubitril-valsartan (ENTRESTO) 49-51 MG Take 1 tablet by mouth 2 (two) times daily.   sitaGLIPtin (JANUVIA) 100 MG tablet TAKE ONE (1) TABLET BY MOUTH EVERY DAY   spironolactone (ALDACTONE) 25 MG tablet TAKE ONE (1) TABLET  BY MOUTH EVERY DAY   [DISCONTINUED] Clotrimazole 1 % OINT Apply to penis twice daily after saline bath/rinse and pat dry (Patient taking differently: Apply 1 application  topically daily as needed (Apply to penis  after saline bath/rinse and pat dry).)   [DISCONTINUED] mupirocin ointment (BACTROBAN) 2 % Apply 1 Application topically 2 (two) times daily. Up to 2 weeks   [DISCONTINUED] triamcinolone cream (KENALOG) 0.1 % Apply 1 Application topically 2 (two) times daily.     Allergies:   Clindamycin/lincomycin   ROS:   Please see the history of  present illness.    All other systems reviewed and are negative.  EKGs/Labs/Other Studies Reviewed:    The following studies were reviewed today: Cardiac Studies & Procedures   CARDIAC CATHETERIZATION  CARDIAC CATHETERIZATION 10/28/2015  Narrative  Prox LAD lesion, 20% stenosed. The lesion was previously treated with a drug-eluting stent greater than two years ago.  1st Diag lesion, 100% stenosed.  Ost Cx lesion, 80% stenosed.  Ost 2nd Mrg to 2nd Mrg lesion, 95% stenosed.  Mid RCA lesion, 25% stenosed.  Ost Ramus lesion, 50% stenosed.  There is moderate to severe left ventricular systolic dysfunction.  1. Severe LCx stenosis, involving the ostium of the vessel and ostium of OM1, anatomy unsuitable for PCI 2. Continued patency of the stented segment in the LAD and total occlusion of the first diagonal with collateral flow from the RCA 3. Widely patent, dominant RCA 4. Moderately severe segmental LV dysfunction  Recommend: medical therapy for CHF/CAD. Aggressive risk reduction. Resume pradaxa tonight, anticipate DC home tomorrow.  Findings Coronary Findings Diagnostic  Dominance: Right  Left Anterior Descending The lesion was previously treated with a drug-eluting stent greater than two years ago.  First Diagonal Branch Collaterals 1st Diag filled by collaterals from Inf Sept.  Ramus Intermedius  Left Circumflex Calcified. Ostial circumflex - progressive disease compared with 2008 study  Second Obtuse Marginal Branch  Right Coronary Artery  Intervention  No interventions have been documented.   CARDIAC CATHETERIZATION  CARDIAC CATHETERIZATION 06/01/2018  Narrative  1st Diag lesion is 100% stenosed.  Ost Ramus lesion is 50% stenosed.  Ost Cx lesion is 80% stenosed.  Ost 2nd Mrg to 2nd Mrg lesion is 95% stenosed.  Mid RCA lesion is 25% stenosed.  Mid LM to Dist LM lesion is 25% stenosed.  Ost LAD to Prox LAD lesion is 20% stenosed.  1.  Widely  patent left main, LAD, and left circumflex 2.  Severe ostial circumflex and ostial OM lesions unchanged from the previous cath study in 2017 3.  Slow flow in the first diagonal branch of the LAD, collateralized from the right PDA 4.  Well compensated left and right heart hemodynamics with low cardiac output calculated by Fick, possibly related to the Fick calculation with the patient's morbid obesity  Recommendations: Stable coronary anatomy, ongoing medical therapy for heart failure.  Left circumflex/obtuse marginal stenoses not approachable by PCI based on ostial lesion locations.  Findings Coronary Findings Diagnostic  Dominance: Right  Left Main The left main is widely patent with heavy calcification and mild distal left main stenosis of 20 to 30%. Mid LM to Dist LM lesion is 25% stenosed.  Left Anterior Descending Ost LAD to Prox LAD lesion is 20% stenosed. The lesion was previously treatedover 2 years ago. The stented segment in the proximal LAD is widely patent with minimal restenosis present.  First Diagonal Branch The diagonal is patent but exhibits slow flow distally, this is unchanged from  previous.  There is faint collateralization of the diagonal from the distal RCA branches. Collaterals 1st Diag filled by collaterals from Inf Sept.  1st Diag lesion is 100% stenosed.  Ramus Intermedius Ost Ramus lesion is 50% stenosed. The ramus intermedius is patent with mild proximal stenosis unchanged from the previous study.  Left Circumflex Ost Cx lesion is 80% stenosed. The lesion is calcified. Ostial circumflex - progressive disease compared with 2008 study.  This lesion is unchanged from the 2017 cath study.  Second Obtuse Marginal Branch Ost 2nd Mrg to 2nd Mrg lesion is 95% stenosed.  Right Coronary Artery This is a large, dominant vessel with minor irregularity but no significant stenosis.  The PDA and PLA branches are widely patent.  The PDA branch supplies a well-formed  collateral to the first diagonal of the LAD. Mid RCA lesion is 25% stenosed.  Intervention  No interventions have been documented.    ECHOCARDIOGRAM  ECHOCARDIOGRAM COMPLETE 09/20/2023  Narrative ECHOCARDIOGRAM REPORT    Patient Name:   TODRICK SIEDSCHLAG Date of Exam: 09/20/2023 Medical Rec #:  [id number]       Height:       68.0 in Accession #:    [id number]      Weight:       222.4 lb Date of Birth:  1946/02/23       BSA:          2.138 m Patient Age:    77 years        BP:           128/84 mmHg Patient Gender: M               HR:           67 bpm. Exam Location:  Outpatient  Procedure: 2D Echo, 3D Echo, Cardiac Doppler, Color Doppler and Strain Analysis  Indications:    I50.40* Unspecified combined systolic (congestive) and diastolic (congestive) heart failure  History:        Patient has prior history of Echocardiogram examinations, most recent 12/28/2020. CAD, Abnormal ECG, Arrythmias:Atrial Fibrillation; Risk Factors:Sleep Apnea, Hypertension, Diabetes, Dyslipidemia and Former Smoker.  Sonographer:    Sheralyn Boatman RDCS Referring Phys: [id number] DANIEL R BENSIMHON   Sonographer Comments: Technically difficult study due to poor echo windows. Image acquisition challenging due to patient body habitus. Very difficult study. IMPRESSIONS   1. The entire anterior septum and apex are hypokinetic. Marland Kitchen Left ventricular ejection fraction, by estimation, is 40 to 45%. The left ventricle has mildly decreased function. The left ventricle demonstrates regional wall motion abnormalities (see scoring diagram/findings for description). There is mild left ventricular hypertrophy. Left ventricular diastolic function could not be evaluated. The average left ventricular global longitudinal strain is -16.6 %. The global longitudinal strain is abnormal. 2. Right ventricular systolic function is low normal. The right ventricular size is normal. 3. The mitral valve is grossly normal. Trivial mitral valve  regurgitation. 4. Native aortic valve, calcificed, functional bicuspid (fused NCC and LCC) image 31, no regurgitation, trace to mild aortic stenosis (Vmax 2.35m/s, AVA VTI 1.5cm2, MG , DI 0.40, SVi 32). 5. The inferior vena cava is dilated in size with >50% respiratory variability, suggesting right atrial pressure of 8 mmHg.  Comparison(s): A prior study was performed on 12/28/2020. LVEF 50-55% with RWMA (anteroseptal wall).  Conclusion(s)/Recommendation(s): Recommend limited echo with Definity to accurately evaluate regional wall motion if clinically indicated.  FINDINGS Left Ventricle: The entire anterior septum and apex are hypokinetic. Left ventricular ejection  fraction, by estimation, is 40 to 45%. The left ventricle has mildly decreased function. The left ventricle demonstrates regional wall motion abnormalities. The average left ventricular global longitudinal strain is -16.6 %. The global longitudinal strain is abnormal. The left ventricular internal cavity size was normal in size. There is mild left ventricular hypertrophy. Left ventricular diastolic function could not be evaluated due to atrial fibrillation. Left ventricular diastolic function could not be evaluated.   LV Wall Scoring: The entire anterior septum and apex are hypokinetic.  Right Ventricle: The right ventricular size is normal. No increase in right ventricular wall thickness. Right ventricular systolic function is low normal.  Left Atrium: Left atrial size was normal in size.  Right Atrium: Right atrial size was normal in size.  Pericardium: There is no evidence of pericardial effusion.  Mitral Valve: The mitral valve is grossly normal. Trivial mitral valve regurgitation.  Tricuspid Valve: The tricuspid valve is normal in structure. Tricuspid valve regurgitation is not demonstrated. No evidence of tricuspid stenosis.  Aortic Valve: Native aortic valve, calcificed, functional bicuspid (fused NCC and LCC)  image 31, no regurgitation, trace to mild aortic stenosis (Vmax 2.43m/s, AVA VTI 1.5cm2, MG , DI 0.40, SVi 32). Aortic valve mean gradient measures 10.5 mmHg. Aortic valve peak gradient measures 19.9 mmHg. Aortic valve area, by VTI measures 1.53 cm.  Pulmonic Valve: The pulmonic valve was normal in structure. Pulmonic valve regurgitation is not visualized. No evidence of pulmonic stenosis.  Aorta: The aortic root and ascending aorta are structurally normal, with no evidence of dilitation.  Venous: The inferior vena cava is dilated in size with greater than 50% respiratory variability, suggesting right atrial pressure of 8 mmHg.  IAS/Shunts: No atrial level shunt detected by color flow Doppler.   LEFT VENTRICLE PLAX 2D LVIDd:         3.90 cm LVIDs:         3.10 cm     2D Longitudinal Strain LV PW:         1.05 cm     2D Strain GLS Avg:     -16.6 % LV IVS:        1.02 cm LVOT diam:     2.20 cm LV SV:         69 LV SV Index:   32 LVOT Area:     3.80 cm  LV Volumes (MOD) LV vol d, MOD A2C: 90.6 ml LV vol d, MOD A4C: 55.3 ml LV vol s, MOD A2C: 37.7 ml LV vol s, MOD A4C: 31.0 ml LV SV MOD A2C:     52.9 ml LV SV MOD A4C:     55.3 ml LV SV MOD BP:      36.7 ml  RIGHT VENTRICLE            IVC RV S prime:     9.03 cm/s  IVC diam: 2.10 cm TAPSE (M-mode): 1.3 cm  LEFT ATRIUM             Index        RIGHT ATRIUM           Index LA diam:        5.00 cm 2.34 cm/m   RA Area:     18.70 cm LA Vol (A2C):   44.7 ml 20.91 ml/m  RA Volume:   45.80 ml  21.42 ml/m LA Vol (A4C):   59.6 ml 27.88 ml/m LA Biplane Vol: 57.4 ml 26.85 ml/m AORTIC VALVE AV Area (Vmax):  1.74 cm AV Area (Vmean):   1.63 cm AV Area (VTI):     1.53 cm AV Vmax:           223.00 cm/s AV Vmean:          151.000 cm/s AV VTI:            0.449 m AV Peak Grad:      19.9 mmHg AV Mean Grad:      10.5 mmHg LVOT Vmax:         102.00 cm/s LVOT Vmean:        64.700 cm/s LVOT VTI:          0.181 m LVOT/AV VTI  ratio: 0.40  AORTA Ao Root diam: 3.40 cm Ao Asc diam:  3.60 cm  MITRAL VALVE MV Area (PHT): 4.80 cm     SHUNTS MV Decel Time: 158 msec     Systemic VTI:  0.18 m MV E velocity: 117.00 cm/s  Systemic Diam: 2.20 cm  Sunit Tolia Electronically signed by Tessa Lerner Signature Date/Time: 09/20/2023/5:04:31 PM    Final             EKG:   EKG Interpretation Date/Time:  Monday November 13 2023 15:18:12 EST Ventricular Rate:  71 PR Interval:    QRS Duration:  128 QT Interval:  374 QTC Calculation: 406 R Axis:   -27  Text Interpretation: Atrial fibrillation Non-specific intra-ventricular conduction block Minimal voltage criteria for LVH, may be normal variant ( R in aVL ) Nonspecific T wave abnormality When compared with ECG of 27-Dec-2020 17:33, PREVIOUS ECG IS PRESENTthe heart rate has decreased significantly otherwise no significant changes Confirmed by Tonny Bollman [phone]) on 11/13/2023 3:33:32 PM    Recent Labs: 11/09/2023: ALT 11; BUN 19; Creatinine, Ser 1.39; Hemoglobin 15.6; Platelets 133.0; Potassium 4.7; Sodium 143; TSH 1.29  Recent Lipid Panel    Component Value Date/Time   CHOL 82 11/09/2023 1528   CHOL 74 (L) 02/07/2018 0859   TRIG 59.0 11/09/2023 1528   HDL 57.40 11/09/2023 1528   HDL 45 02/07/2018 0859   CHOLHDL 1 11/09/2023 1528   VLDL 11.8 11/09/2023 1528   LDLCALC 13 11/09/2023 1528   LDLCALC 13 07/27/2023 1442   LDLDIRECT 18.0 06/13/2017 1504     Risk Assessment/Calculations:    CHA2DS2-VASc Score = 6   This indicates a 9.7% annual risk of stroke. The patient's score is based upon: CHF History: 1 HTN History: 1 Diabetes History: 1 Stroke History: 0 Vascular Disease History: 1 Age Score: 2 Gender Score: 0               Physical Exam:    VS:  BP 110/70   Pulse 71   Ht 5\' 9"  (1.753 m)   Wt 229 lb 12.8 oz (104.2 kg)   SpO2 97%   BMI 33.94 kg/m     Wt Readings from Last 3 Encounters:  11/13/23 229 lb 12.8 oz (104.2 kg)  11/09/23  237 lb 3.2 oz (107.6 kg)  08/16/23 222 lb 6.4 oz (100.9 kg)     GEN:  Well nourished, well developed in no acute distress HEENT: Normal NECK: No JVD; No carotid bruits LYMPHATICS: No lymphadenopathy CARDIAC: Irregularly irregular, 2/6 systolic ejection murmur at the right upper sternal border RESPIRATORY:  Clear to auscultation without rales, wheezing or rhonchi  ABDOMEN: Soft, non-tender, non-distended MUSCULOSKELETAL: 1+ left ankle edema; No deformity  SKIN: Warm and dry NEUROLOGIC:  Alert and oriented x 3 PSYCHIATRIC:  Normal  affect   Assessment & Plan Essential hypertension Blood pressure is controlled on carvedilol, Entresto, and spironolactone Coronary artery disease involving native coronary artery of native heart, unspecified whether angina present Doing well on clopidogrel.  Treated with a statin drug.  No changes made today. Atrial fibrillation, unspecified type (HCC) Anticoagulated with apixaban with no bleeding problems.  Heart rate is controlled.  Today's EKG is reviewed. Systolic congestive heart failure, NYHA class 2, unspecified congestive heart failure chronicity (HCC) Patient with combined systolic and diastolic heart failure.  Most recent echo reviewed demonstrating LVEF 40 to 45%, mild LVH, normal RV function, mild aortic stenosis with a mean gradient of 11 mmHg, and only trivial mitral regurgitation.  Continue Entresto, spironolactone, Jardiance, and carvedilol.  No evidence of volume overload on his exam.  The patient appears clinically stable.  He has really done a nice job with weight loss and I think this is helped him significantly.  He used to weigh over 300 pounds.  His heart failure has been easier to manage and he is not currently having any anginal symptoms.  I reviewed his medical program and he will continue without changes.  I will see him back in 1 year for follow-up evaluation.     Medication Adjustments/Labs and Tests Ordered: Current medicines are  reviewed at length with the patient today.  Concerns regarding medicines are outlined above.  Orders Placed This Encounter  Procedures   EKG 12-Lead   No orders of the defined types were placed in this encounter.   Patient Instructions  Follow-Up: At [facility], you and your health needs are our priority.  As part of our continuing mission to provide you with exceptional heart care, we have created designated Provider Care Teams.  These Care Teams include your primary Cardiologist (physician) and Advanced Practice Providers (APPs -  Physician Assistants and Nurse Practitioners) who all work together to provide you with the care you need, when you need it.  We recommend signing up for the patient portal called "MyChart".  Sign up information is provided on this After Visit Summary.  MyChart is used to connect with patients for Virtual Visits (Telemedicine).  Patients are able to view lab/test results, encounter notes, upcoming appointments, etc.  Non-urgent messages can be sent to your provider as well.   To learn more about what you can do with MyChart, go to [email or web address].    Your next appointment:   1 year(s)  Provider:   Tonny Bollman, MD     Other Instructions   1st Floor: - Lobby - Registration  - Pharmacy  - Lab - Cafe  2nd Floor: - PV Lab - Diagnostic Testing (echo, CT, nuclear med)  3rd Floor: - Vacant  4th Floor: - TCTS (cardiothoracic surgery) - AFib Clinic - Structural Heart Clinic - Vascular Surgery  - Vascular Ultrasound  5th Floor: - HeartCare Cardiology (general and EP) - Clinical Pharmacy for coumadin, hypertension, lipid, weight-loss medications, and med management appointments    Valet parking services will be available as well.          Signed, Tonny Bollman, MD  11/13/2023 3:46 PM    [facility]

## 2023-11-13 NOTE — Patient Instructions (Signed)
 Follow-Up: At [facility], you and your health needs are our priority.  As part of our continuing mission to provide you with exceptional heart care, we have created designated Provider Care Teams.  These Care Teams include your primary Cardiologist (physician) and Advanced Practice Providers (APPs -  Physician Assistants and Nurse Practitioners) who all work together to provide you with the care you need, when you need it.  We recommend signing up for the patient portal called "MyChart".  Sign up information is provided on this After Visit Summary.  MyChart is used to connect with patients for Virtual Visits (Telemedicine).  Patients are able to view lab/test results, encounter notes, upcoming appointments, etc.  Non-urgent messages can be sent to your provider as well.   To learn more about what you can do with MyChart, go to [email or web address].    Your next appointment:   1 year(s)  Provider:   Tonny Bollman, MD     Other Instructions   1st Floor: - Lobby - Registration  - Pharmacy  - Lab - Cafe  2nd Floor: - PV Lab - Diagnostic Testing (echo, CT, nuclear med)  3rd Floor: - Vacant  4th Floor: - TCTS (cardiothoracic surgery) - AFib Clinic - Structural Heart Clinic - Vascular Surgery  - Vascular Ultrasound  5th Floor: - HeartCare Cardiology (general and EP) - Clinical Pharmacy for coumadin, hypertension, lipid, weight-loss medications, and med management appointments    Valet parking services will be available as well.

## 2023-11-14 ENCOUNTER — Other Ambulatory Visit: Payer: Self-pay | Admitting: Family Medicine

## 2023-11-14 DIAGNOSIS — E785 Hyperlipidemia, unspecified: Secondary | ICD-10-CM

## 2023-11-18 ENCOUNTER — Other Ambulatory Visit: Payer: Self-pay | Admitting: Cardiovascular Disease

## 2023-11-20 NOTE — Telephone Encounter (Signed)
 Prescription refill request for Eliquis  received. Indication:afib Last office visit:2/25 Scr:1.39  1/25 Age: 78 Weight:104.2  kg  Prescription refilled

## 2023-11-23 ENCOUNTER — Ambulatory Visit: Payer: Medicare Other

## 2023-11-23 VITALS — Wt 229.0 lb

## 2023-11-23 DIAGNOSIS — Z0000 Encounter for general adult medical examination without abnormal findings: Secondary | ICD-10-CM

## 2023-11-23 NOTE — Progress Notes (Signed)
Subjective:   [name] is a 78 y.o. male who presents for Medicare Annual/Subsequent preventive examination.  Visit Complete: Virtual I connected with  [name] on 11/23/23 by a video and audio enabled telemedicine application and verified that I am speaking with the correct person using two identifiers. Pt requested telphonic  Patient Location: Home  Provider Location: Home Office  I discussed the limitations of evaluation and management by telemedicine. The patient expressed understanding and agreed to proceed.  Vital Signs: Because this visit was a virtual/telehealth visit, some criteria may be missing or patient reported. Any vitals not documented were not able to be obtained and vitals that have been documented are patient reported.  Cardiac Risk Factors include: advanced age (>54men, >55 women);hypertension;diabetes mellitus;male gender;obesity (BMI >30kg/m2)     Objective:    Today's Vitals   11/23/23 0751  Weight: 229 lb (103.9 kg)   Body mass index is 33.82 kg/m.     11/23/2023    8:07 AM 11/14/2022    9:07 AM 01/27/2022   11:00 AM 01/26/2022    6:03 PM 11/12/2021    3:20 PM 02/02/2021    1:37 PM 12/30/2020   12:45 AM  Advanced Directives  Does Patient Have a Medical Advance Directive? No No No No Yes Yes   Type of Advance Directive     Healthcare Power of Attorney Living will   Does patient want to make changes to medical advance directive?      No - Patient declined   Copy of Healthcare Power of Attorney in Chart?     No - copy requested    Would patient like information on creating a medical advance directive? No - Patient declined No - Patient declined No - Patient declined No - Patient declined  No - Patient declined No - Patient declined    Current Medications (verified) Outpatient Encounter Medications as of 11/23/2023  Medication Sig   atorvastatin (LIPITOR) 20 MG tablet TAKE ONE TABLET (20MG  TOTAL) BY MOUTH DAILY   carvedilol (COREG) 25 MG  tablet TAKE ONE TABLET BY MOUTH TWICE A DAY   clopidogrel (PLAVIX) 75 MG tablet TAKE ONE TABLET (75MG  TOTAL) BY MOUTH DAILY   Coenzyme Q10 (COQ10 PO) Take 1 capsule by mouth daily with supper.   cyanocobalamin 1000 MCG tablet Take 1,000 mcg by mouth daily.   dicyclomine (BENTYL) 10 MG capsule TAKE ONE CAPSULE (10MG  TOTAL) BY MOUTH TWO TIMES DAILY AS NEEDED FOR DIARRHEA OR ABDOMINAL CRAMPING   ELIQUIS 5 MG TABS tablet TAKE ONE TABLET (5MG  TOTAL) BY MOUTH TWO TIMES DAILY (REPLACES PRADAXA)   fish oil-omega-3 fatty acids 1000 MG capsule Take 1 g by mouth daily with supper.   furosemide (LASIX) 40 MG tablet TAKE ONE TABLET (40MG  TOTAL) BY MOUTH TWO TIMES DAILY   glucosamine-chondroitin 500-400 MG tablet Take 1 tablet by mouth daily.   glucose blood test strip Use as instructed to test 4 time daily   isosorbide mononitrate (IMDUR) 60 MG 24 hr tablet TAKE ONE (1) TABLET BY MOUTH EVERY DAY   JARDIANCE 10 MG TABS tablet TAKE ONE (1) TABLET BY MOUTH EVERY DAY   levothyroxine (SYNTHROID) 50 MCG tablet Take 1 tablet (50 mcg total) by mouth daily before breakfast.   mesalamine (APRISO) 0.375 g 24 hr capsule Take 4 capsules (1.5 g total) by mouth daily.   metFORMIN (GLUCOPHAGE) 500 MG tablet TAKE ONE TABLET (500MG  TOTAL) BY MOUTH TWO TIMES DAILY WITH A MEAL   saccharomyces boulardii (  FLORASTOR) 250 MG capsule Take 1 capsule (250 mg total) by mouth 2 (two) times daily.   sacubitril-valsartan (ENTRESTO) 49-51 MG Take 1 tablet by mouth 2 (two) times daily.   sitaGLIPtin (JANUVIA) 100 MG tablet TAKE ONE (1) TABLET BY MOUTH EVERY DAY   spironolactone (ALDACTONE) 25 MG tablet TAKE ONE (1) TABLET BY MOUTH EVERY DAY   No facility-administered encounter medications on file as of 11/23/2023.    Allergies (verified) Clindamycin/lincomycin   History: Past Medical History:  Diagnosis Date   B12 deficiency    per patient previously taking shots   C. difficile colitis 04/17/2019   C. difficile diarrhea    CAD  (coronary artery disease), native coronary artery 04/12/2007   Chronic atrial fibrillation (HCC)    initial diagnoses 2012   Chronic combined systolic and diastolic CHF (congestive heart failure) (HCC) 04/15/2019   CKD (chronic kidney disease), stage III (HCC) 04/15/2019   CORONARY ARTERY DISEASE 04/12/2007   2 stents last in 2006.    Cystoid macular edema of right eye 01/16/2020   Cystoid macular edema of right eye 01/16/2020   DIABETES MELLITUS, TYPE II 04/16/2007   Diverticulosis of colon (without mention of hemorrhage)    GERD (gastroesophageal reflux disease) 08/09/2011   Hyperlipidemia 04/16/2007   10/24 reveal study end. Atorvastatin 20mg    HYPERTENSION 04/12/2007   HYPOTHYROIDISM 04/12/2007   Ischemic cardiomyopathy    Morbid obesity (HCC) 08/09/2011   MYOCARDIAL INFARCTION, HX OF 04/12/2007   Obstructive sleep apnea 04/12/2007   02/2011 - AHI 96/h CPAP 13, Lg FF      Stroke (HCC)    Type II diabetes mellitus with peripheral circulatory disorder (HCC) 04/16/2007   Dr. Elvera Lennox manages. Uses fructosamine.     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 Mesalamine.    VITAMIN B12 DEFICIENCY 11/29/2010   Past Surgical History:  Procedure Laterality Date   CARDIAC CATHETERIZATION  10/2002   STENT. 2 stents Dr. Dickie La   CARDIAC CATHETERIZATION N/A 10/28/2015   Procedure: Right/Left Heart Cath and Coronary Angiography;  Surgeon: Tonny Bollman, MD;  Location: [facility] INVASIVE CV LAB;  Service: Cardiovascular;  Laterality: N/A;   CATARACT EXTRACTION     2021 bilateral eyes    COLONOSCOPY  2012   Dr. Jarold Motto; findings consistent with universal UC s/p random biopsies, no active bleeding, polyps, masses.  Pathology with mild chronic active colitis.  Repeat in 5 years.   CORONARY STENT PLACEMENT  2008   LAD    I & D EXTREMITY Right 01/27/2022   Procedure: RIGHT SECOND METARTSOPHALANGEAL JOINT DISARTICULATION, IRRIGATION AND  DEBRIDEMENT OF ABSCESS RIGHT FOOT;  Surgeon: Terance Hart, MD;  Location: [facility] OR;  Service: Orthopedics;  Laterality: Right;   RIGHT/LEFT HEART CATH AND CORONARY ANGIOGRAPHY N/A 06/01/2018   Procedure: RIGHT/LEFT HEART CATH AND CORONARY ANGIOGRAPHY;  Surgeon: Tonny Bollman, MD;  Location: [facility] INVASIVE CV LAB;  Service: Cardiovascular;  Laterality: N/A;   TONSILLECTOMY     Family History  Problem Relation Age of Onset   Heart attack Mother        mid 16s   Heart disease Father        H/O CAD, CABG, VALVE SURGERY   Hypertension Brother    Obesity Brother    Stomach cancer Maternal Aunt    Stomach cancer Paternal Grandmother        ? colon    Colon cancer Neg Hx    Social History  Socioeconomic History   Marital status: Married    Spouse name: Not on file   Number of children: 1   Years of education: Not on file   Highest education level: Not on file  Occupational History   Occupation: RETIRED    Employer: RETIRED  Tobacco Use   Smoking status: Former    Current packs/day: 0.00    Average packs/day: 1 pack/day for 5.0 years (5.0 ttl pk-yrs)    Types: Cigarettes    Start date: 04/13/1961    Quit date: 04/13/1966    Years since quitting: 57.6   Smokeless tobacco: Never  Vaping Use   Vaping status: Never Used  Substance and Sexual Activity   Alcohol use: No    Alcohol/week: 0.0 standard drinks of alcohol    Comment: no more beer   Drug use: No   Sexual activity: Not Currently  Other Topics Concern   Not on file  Social History Narrative   Cardiorehab 3 days a week. 45 minutes to an hour-stationary bike.    GRANDDAUGHTER (MS. 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       Married for 37 years in 2015, married previously for 14 years. Daughter with first wife and 3 grandkids.    Lives alone with wife. Get to see grandkids a lot. New grandchild in middle of 63       Hobbies-yardwork, previously liked to hunt and fish, does some target shooting      Land for 6 yrs   Social Drivers of Corporate investment banker Strain: Low Risk  (11/23/2023)   Overall Financial Resource Strain (CARDIA)    Difficulty of Paying Living Expenses: Not hard at all  Food Insecurity: No Food Insecurity (11/23/2023)   Hunger Vital Sign    Worried About Running Out of Food in the Last Year: Never true    Ran Out of Food in the Last Year: Never true  Transportation Needs: No Transportation Needs (11/23/2023)   PRAPARE - Administrator, Civil Service (Medical): No    Lack of Transportation (Non-Medical): No  Physical Activity: Inactive (11/23/2023)   Exercise Vital Sign    Days of Exercise per Week: 0 days    Minutes of Exercise per Session: 0 min  Stress: No Stress Concern Present (11/23/2023)   [employer] of Occupational Health - Occupational Stress Questionnaire    Feeling of Stress : Not at all  Social Connections: Moderately Integrated (11/23/2023)   Social Connection and Isolation Panel [NHANES]    Frequency of Communication with Friends and Family: Three times a week    Frequency of Social Gatherings with Friends and Family: More than three times a week    Attends Religious Services: More than 4 times per year    Active Member of [employer] or Organizations: No    Attends Engineer, structural: Never    Marital Status: Married    Tobacco Counseling Counseling given: Not Answered   Clinical Intake:  Pre-visit preparation completed: Yes  Pain : No/denies pain     BMI - recorded: 33.82 Nutritional Status: BMI > 30  Obese Diabetes: Yes CBG done?: No Did pt. bring in CBG monitor from home?: No  How often do you need to have someone help you when you read instructions, pamphlets, or other written materials from your doctor  or pharmacy?: 1 - Never  Interpreter Needed?: No  Information entered by :: Lanier Ensign,  LPN   Activities of Daily Living    11/23/2023    8:04 AM  In your present state of health, do you have any difficulty performing the following activities:  Hearing? 0  Vision? 0  Difficulty concentrating or making decisions? 0  Walking or climbing stairs? 0  Dressing or bathing? 0  Doing errands, shopping? 0  Preparing Food and eating ? N  Using the Toilet? N  In the past six months, have you accidently leaked urine? N  Do you have problems with loss of bowel control? N  Managing your Medications? N  Managing your Finances? N  Housekeeping or managing your Housekeeping? N    Patient Care Team: Shelva Majestic, MD as PCP - General (Family Medicine) Tonny Bollman, MD as PCP - Cardiology (Cardiology) Hillis Range, MD (Inactive) as PCP - Electrophysiology (Cardiology) Bensimhon, Bevelyn Buckles, MD as PCP - Advanced Heart Failure (Cardiology) Jethro Bolus, MD as Consulting Physician (Ophthalmology) Carlus Pavlov, MD as Consulting Physician (Endocrinology) Meryl Dare, MD (Inactive) as Consulting Physician (Gastroenterology) Corbin Ade, MD as Consulting Physician (Gastroenterology) Jena Gauss Gerrit Friends, MD as Consulting Physician (Gastroenterology)  Indicate any recent Medical Services you may have received from other than [facility] providers in the past year (date may be approximate).     Assessment:   This is a routine wellness examination for [facility].  Hearing/Vision screen Hearing Screening - Comments:: Pt denies any hearing issues  Vision Screening - Comments:: Pt follows up with Dr Fawn Kirk for annual eye exams    Goals Addressed             This Visit's Progress    Patient Stated       Maintain health and activity        Depression Screen    11/23/2023    8:07 AM 07/27/2023    1:46 PM 06/07/2023   11:53 AM 11/14/2022    9:06 AM 11/12/2021    3:18 PM 09/01/2021   10:53 AM 02/02/2021    1:44 PM  PHQ 2/9 Scores  PHQ - 2 Score 0 0 0 0 0 0 0  PHQ- 9  Score   3        Fall Risk    11/23/2023    8:09 AM 07/27/2023    1:46 PM 06/07/2023   11:45 AM 11/14/2022    9:09 AM 11/12/2021    3:21 PM  Fall Risk   Falls in the past year? 0 0 1 0 0  Number falls in past yr: 0 0 0 0 0  Injury with Fall? 0 0 0 0 0  Risk for fall due to : No Fall Risks Impaired balance/gait History of fall(s) Impaired vision Impaired vision  Follow up Falls prevention discussed;Falls evaluation completed Falls evaluation completed Falls evaluation completed Falls prevention discussed Falls prevention discussed    MEDICARE RISK AT HOME: Medicare Risk at Home Any stairs in or around the home?: Yes If so, are there any without handrails?: No Home free of loose throw rugs in walkways, pet beds, electrical cords, etc?: Yes Adequate lighting in your home to reduce risk of falls?: Yes Life alert?: No Use of a cane, walker or w/c?: No Grab bars in the bathroom?: Yes Shower chair or bench in shower?: No Elevated toilet seat or a handicapped toilet?: Yes  TIMED UP AND GO:  Was the test performed?  No    Cognitive Function:    03/03/2021    3:41 PM 01/25/2018    2:01 PM  MMSE - Mini Mental State Exam  Not completed:  --  Orientation to time 5   Orientation to Place 5   Registration 3   Attention/ Calculation 5   Recall 3   Language- name 2 objects 2   Language- repeat 1   Language- follow 3 step command 3   Language- read & follow direction 1   Write a sentence 1   Copy design 1   Total score 30         11/23/2023    8:10 AM 11/14/2022    9:10 AM 11/12/2021    3:22 PM 11/06/2020    3:36 PM 08/26/2019   10:40 AM  6CIT Screen  What Year? 0 points 0 points 0 points 0 points 0 points  What month? 0 points 0 points 0 points 0 points 0 points  What time? 0 points 0 points 0 points  0 points  Count back from 20 0 points 0 points 0 points 0 points 0 points  Months in reverse 0 points 0 points 0 points 0 points 0 points  Repeat phrase 0 points 0 points 0 points 2  points 0 points  Total Score 0 points 0 points 0 points  0 points    Immunizations Immunization History  Administered Date(s) Administered   Fluad Quad(high Dose 65+) 07/18/2019, 07/08/2020, 07/14/2021, 08/31/2022   Influenza Split 07/06/2011, 06/25/2012   Influenza Whole 07/14/2008, 08/03/2009, 08/24/2010   Influenza, High Dose Seasonal PF 08/10/2016, 06/13/2017, 06/20/2018, 09/13/2023   Influenza,inj,Quad PF,6+ Mos 06/03/2013, 07/11/2014, 06/29/2015   Moderna SARS-COV2 Booster Vaccination 03/17/2021   Moderna Sars-Covid-2 Vaccination 01/02/2020, 02/04/2020, 09/08/2020   Pneumococcal Conjugate-13 02/09/2015   Pneumococcal Polysaccharide-23 07/06/2011   Td 09/22/2010   Zoster, Live 07/19/2011      Flu Vaccine status: Up to date  Pneumococcal vaccine status: Up to date  Covid-19 vaccine status: Information provided on how to obtain vaccines.   Qualifies for Shingles Vaccine? Yes   Zostavax completed No   Shingrix Completed?: No.    Education has been provided regarding the importance of this vaccine. Patient has been advised to call insurance company to determine out of pocket expense if they have not yet received this vaccine. Advised may also receive vaccine at local pharmacy or Health Dept. Verbalized acceptance and understanding.  Screening Tests Health Maintenance  Topic Date Due   COVID-19 Vaccine (4 - 2024-25 season) 11/25/2023 (Originally 06/11/2023)   Zoster Vaccines- Shingrix (1 of 2) 02/07/2024 (Originally 01/20/1965)   Hepatitis C Screening  10/14/2098 (Originally 01/21/1964)   FOOT EXAM  05/01/2024   HEMOGLOBIN A1C  05/08/2024   OPHTHALMOLOGY EXAM  10/08/2024   Diabetic kidney evaluation - eGFR measurement  11/08/2024   Diabetic kidney evaluation - Urine ACR  11/08/2024   Medicare Annual Wellness (AWV)  11/22/2024   Pneumonia Vaccine 41+ Years old  Completed   INFLUENZA VACCINE  Completed   HPV VACCINES  Aged Out   DTaP/Tdap/Td  Discontinued   Colonoscopy   Discontinued    Health Maintenance  There are no preventive care reminders to display for this patient.   Colorectal cancer screening: No longer required.   Additional Screening:  Hepatitis C Screening: does qualify  Vision Screening: Recommended annual ophthalmology exams for early detection of glaucoma and other disorders of the eye. Is the patient up to date with their annual eye exam?  Yes  Who is the provider or what is the name of the office in which the patient attends annual eye exams? Dr Fawn Kirk  If pt is not established with a provider, would they like to be referred to a provider to establish care? No .   Dental Screening: Recommended annual dental exams for proper oral hygiene  Diabetic Foot Exam: Diabetic Foot Exam: Completed 05/02/23  Community Resource Referral / Chronic Care Management: CRR required this visit?  No   CCM required this visit?  No     Plan:     I have personally reviewed and noted the following in the patient's chart:   Medical and social history Use of alcohol, tobacco or illicit drugs  Current medications and supplements including opioid prescriptions. Patient is not currently taking opioid prescriptions. Functional ability and status Nutritional status Physical activity Advanced directives List of other physicians Hospitalizations, surgeries, and ER visits in previous 12 months Vitals Screenings to include cognitive, depression, and falls Referrals and appointments  In addition, I have reviewed and discussed with patient certain preventive protocols, quality metrics, and best practice recommendations. A written personalized care plan for preventive services as well as general preventive health recommendations were provided to patient.     Marzella Schlein, LPN   [id number]/56/[id number]   After Visit Summary: (MyChart) Due to this being a telephonic visit, the after visit summary with patients personalized plan was offered to patient via  MyChart   Nurse Notes: none

## 2023-11-23 NOTE — Patient Instructions (Signed)
[name] , Thank you for taking time to come for your Medicare Wellness Visit. I appreciate your ongoing commitment to your health goals. Please review the following plan we discussed and let me know if I can assist you in the future.   Referrals/Orders/Follow-Ups/Clinician Recommendations: Aim for 30 minutes of exercise or brisk walking, 6-8 glasses of water, and 5 servings of fruits and vegetables each day.   This is a list of the screening recommended for you and due dates:  Health Maintenance  Topic Date Due   Medicare Annual Wellness Visit  11/15/2023   COVID-19 Vaccine (4 - 2024-25 season) 11/25/2023*   Zoster (Shingles) Vaccine (1 of 2) 02/07/2024*   Hepatitis C Screening  10/14/2098*   Complete foot exam   05/01/2024   Hemoglobin A1C  05/08/2024   Eye exam for diabetics  10/08/2024   Yearly kidney function blood test for diabetes  11/08/2024   Yearly kidney health urinalysis for diabetes  11/08/2024   Pneumonia Vaccine  Completed   Flu Shot  Completed   HPV Vaccine  Aged Out   DTaP/Tdap/Td vaccine  Discontinued   Colon Cancer Screening  Discontinued  *Topic was postponed. The date shown is not the original due date.    Advanced directives: (Declined) Advance directive discussed with you today. Even though you declined this today, please call our office should you change your mind, and we can give you the proper paperwork for you to fill out.  Next Medicare Annual Wellness Visit scheduled for next year: Yes

## 2023-11-28 ENCOUNTER — Encounter: Payer: Self-pay | Admitting: Internal Medicine

## 2023-11-28 ENCOUNTER — Ambulatory Visit: Payer: Medicare Other | Admitting: Internal Medicine

## 2023-11-28 VITALS — BP 110/60 | HR 84 | Ht 69.0 in | Wt 224.8 lb

## 2023-11-28 DIAGNOSIS — E1151 Type 2 diabetes mellitus with diabetic peripheral angiopathy without gangrene: Secondary | ICD-10-CM

## 2023-11-28 DIAGNOSIS — E039 Hypothyroidism, unspecified: Secondary | ICD-10-CM | POA: Diagnosis not present

## 2023-11-28 DIAGNOSIS — E785 Hyperlipidemia, unspecified: Secondary | ICD-10-CM | POA: Diagnosis not present

## 2023-11-28 DIAGNOSIS — Z7984 Long term (current) use of oral hypoglycemic drugs: Secondary | ICD-10-CM

## 2023-11-28 NOTE — Progress Notes (Signed)
Patient ID: [name], male   DOB: 1945-12-08, 78 y.o.   MRN: [id number]  HPI: [name] is a 78 y.o.-year-old male, returning for DM2, dx 2004, non-insulin-dependent, uncontrolled, with complications (CAD, s/p MI, s/p 2nd R toe amputation b/c osteomyelitis). Last visit 6 months ago. He has [facility] as supplemental and [employer] for meds in 10/2015.  Interim history: No increased urination, blurry vision, nausea, diarrhea, chest pain. He has good energy and stays busy.  Reviewed HbA1c levels: Lab Results  Component Value Date   HGBA1C 6.5 11/09/2023   HGBA1C 6.5 (A) 06/08/2023   HGBA1C 6.0 (A) 12/01/2022   HGBA1C 6.0 (A) 05/26/2022   HGBA1C 6.2 (A) 11/18/2021   HGBA1C 6.9 (A) 07/14/2021   HGBA1C 5.9 (H) 12/27/2020   HGBA1C 6.3 (A) 07/08/2020   HGBA1C 6.8 (H) 02/24/2020   HGBA1C 5.9 (A) 11/06/2019   HGBA1C 6.9 (H) 04/26/2019   HGBA1C 7.7 (A) 10/24/2018   HGBA1C 8.8 06/16/2017   HGBA1C 9.3 03/10/2017   HGBA1C 7.6 (H) 10/25/2015   HGBA1C 8.7 (H) 10/15/2014   HGBA1C 7.7 (H) 07/11/2014   HGBA1C 7.7 (H) 04/07/2014   HGBA1C 8.1 (H) 01/06/2014   HGBA1C 7.8 (H) 10/07/2013   02/27/2019: HbA1c calculated from fructosamine is better: 6.45%. 10/24/2018: HbA1c calculated from fructosamine is slightly higher than the one from 02/2018, at 6.6%. 02/13/2018: The HbA1c calculated from the fructosamine is excellent, at 6.3%! 10/16/2017: HbA1c calculated from fructosamine is: 7.3% (higher). 06/16/2017: HbA1c calculated from the fructosamine is stable, at 6.8%.  12/08/2016: HbA1c calculated from the fructosamine is better, at 6.86%. 08/10/2016: HbA1c calculated from  fructosamine is 7.88% 05/10/2016: HbA1c calculated from fructosamine is 7.6% 01/06/2016: HbA1c calculated from fructosamine is 6.6% 09/06/2016: HbA1c calculated from fructosamine is 7.0% 06/01/2015: HbA1c calculated from fructosamine is 7.0% 02/23/2015: HbA1c calculated from fructosamine is 6.88% 10/15/2014: HbA1c calculated  from fructosamine is 7.17% He started to change his eating habits since 03/2013.   He is on: - Metformin 1000 mg 2x a day >> 500 mg 2x a day  - Januvia 100 mg in am  - Jardiance 10 mg in am He was on glipizide 10 mg twice a day in the past but this was stopped during hospitalization in 12/2020.  Pt checks his sugars 4 times a day per his excellent log: - am:109-133, 135 >> 111-134 >> 125-136 >> 119-136 - 2h after breakfast: 142-162 >> 142-159 >> 153-162 >> 152-161 - before lunch 144 >> 110-120s >> n/c   - before dinner: 96-114 >> 101-133  >> 98-119 >> 99-116 >> 102-119 - 2h after dinner: 144-162 >> 134-157, 166 >> 145-154 >> 145-152 He has hypoglycemia awareness in the 60s. Highest: 287 >> ... 162 >> 166 >> 132 >> 161  His wife is a Research scientist (life sciences).  He saw Norm Salt (nutritionist).  -+ Mild CKD, last BUN/creatinine:  Lab Results  Component Value Date   BUN 19 11/09/2023   CREATININE 1.39 11/09/2023   Lab Results  Component Value Date   MICRALBCREAT 6.6 11/09/2023   MICRALBCREAT 3.8 09/27/2022   MICRALBCREAT 2.4 02/27/2019   MICRALBCREAT 3.8 04/02/2013  On Entresto.  -+ HL:  last set of lipids: Lab Results  Component Value Date   CHOL 82 11/09/2023   HDL 57.40 11/09/2023   LDLCALC 13 11/09/2023   LDLDIRECT 18.0 06/13/2017   TRIG 59.0 11/09/2023   CHOLHDL 1 11/09/2023  He was in the Reveal Study x 4 years >> stopped. On Lipitor 20 and omega-3 fatty  acids.  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 Nile Riggs.  Per previous reports, he did not have macular edema, but he has cystoid macular edema OD -Dr. Luciana Axe.  He has a history of cataract surgery.   - no numbness and tingling in his feet.  Last foot exam 08/30/2023-Dr. Stacie Acres. He had toe osteomyelitis and had to have it amputated in 01/2022.  He is now seeing Dr. Stacie Acres. He has onychomycosis.  He is compliant with CPAP for OSA, also has A. Fib, IBD -ulcerative pancolitis, GERD, also, obesity. In 2019 she  was also diagnosed with CHF -EF of 25 to 30%.  His Lasix was increased to twice a day(takes the second dose as needed). Cardiac cath >> no significant obstruction.  He was started on Entresto. He was admitted 3/20-24/2022 for hypoxemic respiratory failure in the setting of CHF after not taking his Lasix for a few weeks due to increased urination.  At that time, he was unresponsive, had A. fib with RVR at that time and a glucose level at 260.  He had to be intubated.  He recovered well afterwards.  He has a history of very low vitamin B12, improved: Lab Results  Component Value Date   VITAMINB12 758 05/02/2023   VITAMINB12 734 03/03/2022   VITAMINB12 1,256 (H) 09/02/2020   VITAMINB12 103 (L) 02/24/2020   VITAMINB12 267 05/01/2012   VITAMINB12 201 (L) 11/23/2010   He was started on B12 injections - per PCP.  Hypothyroidism:  Pt is on levothyroxine 50 mcg daily, taken: - in am - fasting - at least 30 min from b'fast - no Ca, Fe, MVI, PPIs - not on Biotin  Latest TSH was reviewed and this was normal: Lab Results  Component Value Date   TSH 1.29 11/09/2023   Pt denies: - feeling nodules in neck - hoarseness - dysphagia - choking  ROS: + See HPI  I reviewed pt's medications, allergies, PMH, social hx, family hx, and changes were documented in the history of present illness. Otherwise, unchanged from my initial visit note.  Past Medical History:  Diagnosis Date   B12 deficiency    per patient previously taking shots   C. difficile colitis 04/17/2019   C. difficile diarrhea    CAD (coronary artery disease), native coronary artery 04/12/2007   Chronic atrial fibrillation (HCC)    initial diagnoses 2012   Chronic combined systolic and diastolic CHF (congestive heart failure) (HCC) 04/15/2019   CKD (chronic kidney disease), stage III (HCC) 04/15/2019   CORONARY ARTERY DISEASE 04/12/2007   2 stents last in 2006.    Cystoid macular edema of right eye 01/16/2020   Cystoid macular  edema of right eye 01/16/2020   DIABETES MELLITUS, TYPE II 04/16/2007   Diverticulosis of colon (without mention of hemorrhage)    GERD (gastroesophageal reflux disease) 08/09/2011   Hyperlipidemia 04/16/2007   10/24 reveal study end. Atorvastatin 20mg    HYPERTENSION 04/12/2007   HYPOTHYROIDISM 04/12/2007   Ischemic cardiomyopathy    Morbid obesity (HCC) 08/09/2011   MYOCARDIAL INFARCTION, HX OF 04/12/2007   Obstructive sleep apnea 04/12/2007   02/2011 - AHI 96/h CPAP 13, Lg FF      Stroke (HCC)    Type II diabetes mellitus with peripheral circulatory disorder (HCC) 04/16/2007   Dr. Elvera Lennox manages. Uses fructosamine.     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 Mesalamine.    VITAMIN B12 DEFICIENCY 11/29/2010  Past Surgical History:  Procedure Laterality Date   CARDIAC CATHETERIZATION  10/2002   STENT. 2 stents Dr. Dickie La   CARDIAC CATHETERIZATION N/A 10/28/2015   Procedure: Right/Left Heart Cath and Coronary Angiography;  Surgeon: Tonny Bollman, MD;  Location: [facility] INVASIVE CV LAB;  Service: Cardiovascular;  Laterality: N/A;   CATARACT EXTRACTION     2021 bilateral eyes    COLONOSCOPY  2012   Dr. Jarold Motto; findings consistent with universal UC s/p random biopsies, no active bleeding, polyps, masses.  Pathology with mild chronic active colitis.  Repeat in 5 years.   CORONARY STENT PLACEMENT  2008   LAD    I & D EXTREMITY Right 01/27/2022   Procedure: RIGHT SECOND METARTSOPHALANGEAL JOINT DISARTICULATION, IRRIGATION AND DEBRIDEMENT OF ABSCESS RIGHT FOOT;  Surgeon: Terance Hart, MD;  Location: [facility] OR;  Service: Orthopedics;  Laterality: Right;   RIGHT/LEFT HEART CATH AND CORONARY ANGIOGRAPHY N/A 06/01/2018   Procedure: RIGHT/LEFT HEART CATH AND CORONARY ANGIOGRAPHY;  Surgeon: Tonny Bollman, MD;  Location: [facility] INVASIVE CV LAB;  Service: Cardiovascular;  Laterality: N/A;   TONSILLECTOMY     Social History    Socioeconomic History   Marital status: Married    Spouse name: Not on file   Number of children: 1   Years of education: Not on file   Highest education level: Not on file  Occupational History   Occupation: RETIRED    Employer: RETIRED  Tobacco Use   Smoking status: Former    Current packs/day: 0.00    Average packs/day: 1 pack/day for 5.0 years (5.0 ttl pk-yrs)    Types: Cigarettes    Start date: 04/13/1961    Quit date: 04/13/1966    Years since quitting: 57.6   Smokeless tobacco: Never  Vaping Use   Vaping status: Never Used  Substance and Sexual Activity   Alcohol use: No    Alcohol/week: 0.0 standard drinks of alcohol    Comment: no more beer   Drug use: No   Sexual activity: Not Currently  Other Topics Concern   Not on file  Social History Narrative   Cardiorehab 3 days a week. 45 minutes to an hour-stationary bike.    GRANDDAUGHTER (MS. 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       Married for 37 years in 2015, married previously for 14 years. Daughter with first wife and 3 grandkids.    Lives alone with wife. Get to see grandkids a lot. New grandchild in middle of 34      Hobbies-yardwork, previously liked to hunt and fish, does some target shooting      Land for 6 yrs   Social Drivers of Corporate investment banker Strain: Low Risk  (11/23/2023)   Overall Financial Resource Strain (CARDIA)    Difficulty of Paying Living Expenses: Not hard at all  Food Insecurity: No Food Insecurity (11/23/2023)   Hunger Vital Sign    Worried About Running Out of Food in the Last Year: Never true    Ran Out of Food in the Last Year: Never true  Transportation Needs: No Transportation Needs (11/23/2023)   PRAPARE - Administrator, Civil Service (Medical): No    Lack of Transportation (Non-Medical): No  Physical Activity: Inactive (11/23/2023)    Exercise Vital Sign    Days of Exercise per Week: 0  days    Minutes of Exercise per Session: 0 min  Stress: No Stress Concern Present (11/23/2023)   [employer] of Occupational Health - Occupational Stress Questionnaire    Feeling of Stress : Not at all  Social Connections: Moderately Integrated (11/23/2023)   Social Connection and Isolation Panel [NHANES]    Frequency of Communication with Friends and Family: Three times a week    Frequency of Social Gatherings with Friends and Family: More than three times a week    Attends Religious Services: More than 4 times per year    Active Member of [employer] or Organizations: No    Attends Banker Meetings: Never    Marital Status: Married  Catering manager Violence: Not At Risk (11/23/2023)   Humiliation, Afraid, Rape, and Kick questionnaire    Fear of Current or Ex-Partner: No    Emotionally Abused: No    Physically Abused: No    Sexually Abused: No   Current Outpatient Medications on File Prior to Visit  Medication Sig Dispense Refill   atorvastatin (LIPITOR) 20 MG tablet TAKE ONE TABLET (20MG  TOTAL) BY MOUTH DAILY 30 tablet 1   carvedilol (COREG) 25 MG tablet TAKE ONE TABLET BY MOUTH TWICE A DAY 60 tablet 1   clopidogrel (PLAVIX) 75 MG tablet TAKE ONE TABLET (75MG  TOTAL) BY MOUTH DAILY 90 tablet 3   Coenzyme Q10 (COQ10 PO) Take 1 capsule by mouth daily with supper.     cyanocobalamin 1000 MCG tablet Take 1,000 mcg by mouth daily.     dicyclomine (BENTYL) 10 MG capsule TAKE ONE CAPSULE (10MG  TOTAL) BY MOUTH TWO TIMES DAILY AS NEEDED FOR DIARRHEA OR ABDOMINAL CRAMPING 180 capsule 3   ELIQUIS 5 MG TABS tablet TAKE ONE TABLET (5MG  TOTAL) BY MOUTH TWO TIMES DAILY (REPLACES PRADAXA) 180 tablet 1   fish oil-omega-3 fatty acids 1000 MG capsule Take 1 g by mouth daily with supper.     furosemide (LASIX) 40 MG tablet TAKE ONE TABLET (40MG  TOTAL) BY MOUTH TWO TIMES DAILY 60 tablet 7   glucosamine-chondroitin 500-400 MG tablet Take 1  tablet by mouth daily.     glucose blood test strip Use as instructed to test 4 time daily 400 each 3   isosorbide mononitrate (IMDUR) 60 MG 24 hr tablet TAKE ONE (1) TABLET BY MOUTH EVERY DAY 90 tablet 3   JARDIANCE 10 MG TABS tablet TAKE ONE (1) TABLET BY MOUTH EVERY DAY 90 tablet 3   levothyroxine (SYNTHROID) 50 MCG tablet Take 1 tablet (50 mcg total) by mouth daily before breakfast. 90 tablet 3   mesalamine (APRISO) 0.375 g 24 hr capsule Take 4 capsules (1.5 g total) by mouth daily. 360 capsule 3   metFORMIN (GLUCOPHAGE) 500 MG tablet TAKE ONE TABLET (500MG  TOTAL) BY MOUTH TWO TIMES DAILY WITH A MEAL 180 tablet 3   saccharomyces boulardii (FLORASTOR) 250 MG capsule Take 1 capsule (250 mg total) by mouth 2 (two) times daily.     sacubitril-valsartan (ENTRESTO) 49-51 MG Take 1 tablet by mouth 2 (two) times daily. 60 tablet 11   sitaGLIPtin (JANUVIA) 100 MG tablet TAKE ONE (1) TABLET BY MOUTH EVERY DAY 90 tablet 3   spironolactone (ALDACTONE) 25 MG tablet TAKE ONE (1) TABLET BY MOUTH EVERY DAY 30 tablet 1   No current facility-administered medications on file prior to visit.   Allergies  Allergen Reactions   Clindamycin/Lincomycin     Developed C. difficile colitis 1 month after use   Family History  Problem Relation Age of Onset   Heart attack Mother        mid 43s   Heart disease Father        H/O CAD, CABG, VALVE SURGERY   Hypertension Brother    Obesity Brother    Stomach cancer Maternal Aunt    Stomach cancer Paternal Grandmother        ? colon    Colon cancer Neg Hx    PE: BP 110/60   Pulse 84   Ht 5\' 9"  (1.753 m)   Wt 224 lb 12.8 oz (102 kg)   SpO2 91%   BMI 33.20 kg/m   Wt Readings from Last 10 Encounters:  11/28/23 224 lb 12.8 oz (102 kg)  11/23/23 229 lb (103.9 kg)  11/13/23 229 lb 12.8 oz (104.2 kg)  11/09/23 237 lb 3.2 oz (107.6 kg)  08/16/23 222 lb 6.4 oz (100.9 kg)  08/16/23 221 lb 6.4 oz (100.4 kg)  07/27/23 225 lb 12.8 oz (102.4 kg)  06/08/23 225 lb  3.2 oz (102.2 kg)  06/07/23 225 lb 12.8 oz (102.4 kg)  05/02/23 227 lb 6.4 oz (103.1 kg)   Constitutional: overweight, in NAD Eyes: EOMI, no exophthalmos ENT: no thyromegaly, no cervical lymphadenopathy Cardiovascular: RRR, No MRG, + B periankle edema L>R Respiratory: CTA B Musculoskeletal: no deformities Skin: + stasis dermatitis rash BLE, + violaceous darker semicircular rash above the medial aspect of his left foot (NLD?) Neurological: + tremor with outstretched hands  ASSESSMENT: 1. DM2, non-insulin-dependent, previously uncontrolled, with complications - CAD, s/p MI 10/2006 - s/p stent - sees Dr. Excell Seltzer  2. HL  3.  Hypothyroidism  PLAN:  1. Patient with longstanding, previously uncontrolled, type 2 diabetes with significant improvement in control in the last 5 years.  At last visit, HbA1c was 6.5% and this was rechecked at last visit with Dr. Durene Cal last month and it remained stable. -We did not change his regimen at last visit.  He continues on metformin, DPP 4 inhibitor, and SGLT2 inhibitor. -At today's visit, sugars remain at goal.  He is doing a great job taking at different times of the day, rotating check times.  We discussed about possibly using a GLP-1 receptor agonist mostly for cardiovascular benefits.  He would like to hold off for now but I advised him to let me know if he decides to start it.  Of note, he does have a history of ulcerative pancolitis and we have to be very careful with GI symptoms in case we start this. - I advised him to Patient Instructions  Please continue:  - Metformin 500 mg 2x a day - Januvia 100 mg before breakfast - Jardiance 10 mg before breakfast   Please let me know if you want to try Ozempic.  Please return in 6 months with your sugar log.   - advised to check sugars at different times of the day - 1x a day, rotating check times - advised for yearly eye exams >> he is UTD - return to clinic in 6 months  2. HL -Lipid panel from  last month: All fractions at goal: Lab Results  Component Value Date   CHOL 82 11/09/2023   HDL 57.40 11/09/2023   LDLCALC 13 11/09/2023   LDLDIRECT 18.0 06/13/2017   TRIG 59.0 11/09/2023   CHOLHDL 1 11/09/2023  -He continues Lipitor 20 mg daily and omega-3 fatty acids, without side effects  3.  Hypothyroidism - latest thyroid labs reviewed with pt. >> normal: Lab  Results  Component Value Date   TSH 1.29 11/09/2023  - he continues on LT4 50 mcg daily - pt feels good on this dose. - we discussed about taking the thyroid hormone every day, with water, >30 minutes before breakfast, separated by >4 hours from acid reflux medications, calcium, iron, multivitamins. Pt. is taking it correctly.  Carlus Pavlov, MD PhD [facility]

## 2023-11-28 NOTE — Patient Instructions (Addendum)
Please continue:  - Metformin 500 mg 2x a day - Januvia 100 mg before breakfast - Jardiance 10 mg before breakfast   Please let me know if you want to try Ozempic.  Please return in 6 months with your sugar log.

## 2023-11-30 ENCOUNTER — Ambulatory Visit (INDEPENDENT_AMBULATORY_CARE_PROVIDER_SITE_OTHER): Payer: Medicare Other | Admitting: Podiatry

## 2023-11-30 DIAGNOSIS — E1151 Type 2 diabetes mellitus with diabetic peripheral angiopathy without gangrene: Secondary | ICD-10-CM

## 2023-11-30 DIAGNOSIS — M79674 Pain in right toe(s): Secondary | ICD-10-CM | POA: Diagnosis not present

## 2023-11-30 DIAGNOSIS — M79675 Pain in left toe(s): Secondary | ICD-10-CM

## 2023-11-30 DIAGNOSIS — B351 Tinea unguium: Secondary | ICD-10-CM | POA: Diagnosis not present

## 2023-11-30 DIAGNOSIS — S98131S Complete traumatic amputation of one right lesser toe, sequela: Secondary | ICD-10-CM

## 2023-11-30 NOTE — Progress Notes (Signed)
 This patient returns to my office for at risk foot care.  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  Patient has amputation second toe right foot.  This patient is unable to cut nails himself since the patient cannot reach his nails.These nails are painful walking and wearing shoes.  This patient presents for at risk foot care today.  General Appearance  Alert, conversant and in no acute stress.  Vascular  Dorsalis pedis and posterior tibial  pulses are weakly  palpable  bilaterally.  Capillary return is within normal limits  bilaterally. Temperature is within normal limits  bilaterally..  Venous stasis  B/L.  Neurologic  Senn-Weinstein monofilament wire test within normal limits  bilaterally. Muscle power within normal limits bilaterally.  Nails Thick disfigured discolored nails with subungual debris  from hallux to fifth toes  left and 1,3-5 right.. No evidence of bacterial infection or drainage bilaterally.  Orthopedic  No limitations of motion  feet .  No crepitus or effusions noted.  No bony pathology or digital deformities noted. Amputation second toe right foot.  Skin  normotropic skin with no porokeratosis noted bilaterally.  No signs of infections or ulcers noted.  Dry peeling skin  B/L. Marland Kitchen  Onychomycosis  Pain in right toes  Pain in left toes  Consent was obtained for treatment procedures.   Mechanical debridement of nails 1-5  bilaterally performed with a nail nipper.  Filed with dremel without incident.    Return office visit   3 months                   Told patient to return for periodic foot care and evaluation due to potential at risk complications.   Helane Gunther DPM

## 2023-12-01 DIAGNOSIS — I872 Venous insufficiency (chronic) (peripheral): Secondary | ICD-10-CM | POA: Diagnosis not present

## 2023-12-01 DIAGNOSIS — B351 Tinea unguium: Secondary | ICD-10-CM | POA: Diagnosis not present

## 2023-12-07 ENCOUNTER — Other Ambulatory Visit: Payer: Self-pay | Admitting: Cardiovascular Disease

## 2023-12-09 ENCOUNTER — Other Ambulatory Visit: Payer: Self-pay | Admitting: Gastroenterology

## 2023-12-09 DIAGNOSIS — K5100 Ulcerative (chronic) pancolitis without complications: Secondary | ICD-10-CM

## 2024-01-02 ENCOUNTER — Other Ambulatory Visit: Payer: Self-pay | Admitting: Cardiovascular Disease

## 2024-01-08 DIAGNOSIS — H43813 Vitreous degeneration, bilateral: Secondary | ICD-10-CM | POA: Diagnosis not present

## 2024-01-08 DIAGNOSIS — E113412 Type 2 diabetes mellitus with severe nonproliferative diabetic retinopathy with macular edema, left eye: Secondary | ICD-10-CM | POA: Diagnosis not present

## 2024-01-08 DIAGNOSIS — E113391 Type 2 diabetes mellitus with moderate nonproliferative diabetic retinopathy without macular edema, right eye: Secondary | ICD-10-CM | POA: Diagnosis not present

## 2024-01-08 LAB — HM DIABETES EYE EXAM

## 2024-01-19 ENCOUNTER — Other Ambulatory Visit: Payer: Self-pay | Admitting: Family Medicine

## 2024-01-19 DIAGNOSIS — E785 Hyperlipidemia, unspecified: Secondary | ICD-10-CM

## 2024-01-20 ENCOUNTER — Other Ambulatory Visit: Payer: Self-pay | Admitting: Internal Medicine

## 2024-02-26 ENCOUNTER — Other Ambulatory Visit: Payer: Self-pay | Admitting: Internal Medicine

## 2024-02-26 DIAGNOSIS — E1151 Type 2 diabetes mellitus with diabetic peripheral angiopathy without gangrene: Secondary | ICD-10-CM

## 2024-02-29 ENCOUNTER — Encounter: Payer: Self-pay | Admitting: Podiatry

## 2024-02-29 ENCOUNTER — Ambulatory Visit (INDEPENDENT_AMBULATORY_CARE_PROVIDER_SITE_OTHER): Payer: Medicare Other | Admitting: Podiatry

## 2024-02-29 DIAGNOSIS — E1151 Type 2 diabetes mellitus with diabetic peripheral angiopathy without gangrene: Secondary | ICD-10-CM

## 2024-02-29 DIAGNOSIS — S98131S Complete traumatic amputation of one right lesser toe, sequela: Secondary | ICD-10-CM

## 2024-02-29 DIAGNOSIS — M79674 Pain in right toe(s): Secondary | ICD-10-CM

## 2024-02-29 DIAGNOSIS — M79675 Pain in left toe(s): Secondary | ICD-10-CM

## 2024-02-29 DIAGNOSIS — B351 Tinea unguium: Secondary | ICD-10-CM | POA: Diagnosis not present

## 2024-02-29 NOTE — Progress Notes (Signed)
 This patient returns to my office for at risk foot care.  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  Patient has amputation second toe right foot.  This patient is unable to cut nails himself since the patient cannot reach his nails.These nails are painful walking and wearing shoes.  This patient presents for at risk foot care today.  General Appearance  Alert, conversant and in no acute stress.  Vascular  Dorsalis pedis and posterior tibial  pulses are weakly  palpable  bilaterally.  Capillary return is within normal limits  bilaterally. Temperature is within normal limits  bilaterally..  Venous stasis  B/L.  Neurologic  Senn-Weinstein monofilament wire test within normal limits  bilaterally. Muscle power within normal limits bilaterally.  Nails Thick disfigured discolored nails with subungual debris  from hallux to fifth toes  left and 1,3-5 right.. No evidence of bacterial infection or drainage bilaterally.  Orthopedic  No limitations of motion  feet .  No crepitus or effusions noted.  No bony pathology or digital deformities noted. Amputation second toe right foot.  Skin  normotropic skin with no porokeratosis noted bilaterally.  No signs of infections or ulcers noted.  Dry peeling skin  B/L. Marland Kitchen  Onychomycosis  Pain in right toes  Pain in left toes  Consent was obtained for treatment procedures.   Mechanical debridement of nails 1-5  bilaterally performed with a nail nipper.  Filed with dremel without incident.    Return office visit   3 months                   Told patient to return for periodic foot care and evaluation due to potential at risk complications.   Helane Gunther DPM

## 2024-03-20 ENCOUNTER — Other Ambulatory Visit: Payer: Self-pay | Admitting: Family Medicine

## 2024-03-20 DIAGNOSIS — E785 Hyperlipidemia, unspecified: Secondary | ICD-10-CM

## 2024-03-29 ENCOUNTER — Other Ambulatory Visit: Payer: Self-pay | Admitting: Family Medicine

## 2024-03-29 DIAGNOSIS — E785 Hyperlipidemia, unspecified: Secondary | ICD-10-CM

## 2024-04-08 DIAGNOSIS — E113391 Type 2 diabetes mellitus with moderate nonproliferative diabetic retinopathy without macular edema, right eye: Secondary | ICD-10-CM | POA: Diagnosis not present

## 2024-04-08 DIAGNOSIS — E113412 Type 2 diabetes mellitus with severe nonproliferative diabetic retinopathy with macular edema, left eye: Secondary | ICD-10-CM | POA: Diagnosis not present

## 2024-04-08 DIAGNOSIS — H43813 Vitreous degeneration, bilateral: Secondary | ICD-10-CM | POA: Diagnosis not present

## 2024-05-11 ENCOUNTER — Other Ambulatory Visit: Payer: Self-pay | Admitting: Family Medicine

## 2024-05-15 ENCOUNTER — Ambulatory Visit: Payer: Medicare Other | Admitting: Family Medicine

## 2024-05-25 ENCOUNTER — Other Ambulatory Visit: Payer: Self-pay | Admitting: Cardiovascular Disease

## 2024-05-25 DIAGNOSIS — I4821 Permanent atrial fibrillation: Secondary | ICD-10-CM

## 2024-05-27 NOTE — Telephone Encounter (Signed)
 Eliquis  5mg  refill request received. Patient is 78 years old, weight-102kg, Crea-1.39 on 11/09/23, Diagnosis-Afib, and last seen by Dr. Wonda on 11/13/23. Dose is appropriate based on dosing criteria. Will send in refill to requested pharmacy.

## 2024-05-28 ENCOUNTER — Encounter: Payer: Self-pay | Admitting: Internal Medicine

## 2024-05-28 ENCOUNTER — Ambulatory Visit (INDEPENDENT_AMBULATORY_CARE_PROVIDER_SITE_OTHER): Payer: Medicare Other | Admitting: Internal Medicine

## 2024-05-28 VITALS — BP 124/80 | HR 109 | Ht 69.0 in | Wt 223.0 lb

## 2024-05-28 DIAGNOSIS — E1151 Type 2 diabetes mellitus with diabetic peripheral angiopathy without gangrene: Secondary | ICD-10-CM

## 2024-05-28 DIAGNOSIS — E785 Hyperlipidemia, unspecified: Secondary | ICD-10-CM

## 2024-05-28 DIAGNOSIS — E039 Hypothyroidism, unspecified: Secondary | ICD-10-CM

## 2024-05-28 DIAGNOSIS — Z7984 Long term (current) use of oral hypoglycemic drugs: Secondary | ICD-10-CM

## 2024-05-28 LAB — POCT GLYCOSYLATED HEMOGLOBIN (HGB A1C): Hemoglobin A1C: 6.4 % — AB (ref 4.0–5.6)

## 2024-05-28 NOTE — Progress Notes (Signed)
 Patient ID: [name], male   DOB: 1946/05/31, 78 y.o.   MRN: [id number]  HPI: [name] is a 78 y.o.-year-old male, returning for DM2, dx 2004, non-insulin -dependent, uncontrolled, with complications (CAD, s/p MI, s/p 2nd R toe amputation b/c osteomyelitis). Last visit 7 months ago. He has [facility] as supplemental and [employer] for meds in 10/2015.  Interim history: No increased urination, blurry vision, nausea, diarrhea, chest pain. He has good energy and stays busy. He had more dietary indiscretions recently - cake - for several of his grandchildren's birthdays.  Reviewed HbA1c levels: Lab Results  Component Value Date   HGBA1C 6.5 11/09/2023   HGBA1C 6.5 (A) 06/08/2023   HGBA1C 6.0 (A) 12/01/2022   HGBA1C 6.0 (A) 05/26/2022   HGBA1C 6.2 (A) 11/18/2021   HGBA1C 6.9 (A) 07/14/2021   HGBA1C 5.9 (H) 12/27/2020   HGBA1C 6.3 (A) 07/08/2020   HGBA1C 6.8 (H) 02/24/2020   HGBA1C 5.9 (A) 11/06/2019   HGBA1C 6.9 (H) 04/26/2019   HGBA1C 7.7 (A) 10/24/2018   HGBA1C 8.8 06/16/2017   HGBA1C 9.3 03/10/2017   HGBA1C 7.6 (H) 10/25/2015   HGBA1C 8.7 (H) 10/15/2014   HGBA1C 7.7 (H) 07/11/2014   HGBA1C 7.7 (H) 04/07/2014   HGBA1C 8.1 (H) 01/06/2014   HGBA1C 7.8 (H) 10/07/2013   02/27/2019: HbA1c calculated from fructosamine is better: 6.45%. 10/24/2018: HbA1c calculated from fructosamine is slightly higher than the one from 02/2018, at 6.6%. 02/13/2018: The HbA1c calculated from the fructosamine is excellent, at 6.3%! 10/16/2017: HbA1c calculated from fructosamine is: 7.3% (higher). 06/16/2017: HbA1c calculated from the fructosamine is stable, at 6.8%.  12/08/2016: HbA1c calculated from the fructosamine is better, at 6.86%. 08/10/2016: HbA1c calculated from  fructosamine is 7.88% 05/10/2016: HbA1c calculated from fructosamine is 7.6% 01/06/2016: HbA1c calculated from fructosamine is 6.6% 09/06/2016: HbA1c calculated from fructosamine is 7.0% 06/01/2015: HbA1c calculated from  fructosamine is 7.0% 02/23/2015: HbA1c calculated from fructosamine is 6.88% 10/15/2014: HbA1c calculated from fructosamine is 7.17% He started to change his eating habits since 03/2013.   He is on: - Metformin  1000 mg 2x a day >> 500 mg 2x a day  - Januvia  100 mg in am  - Jardiance  10 mg in am He was on glipizide  10 mg twice a day in the past but this was stopped during hospitalization in 12/2020.  Pt checks his sugars 4 times a day per his excellent log: - am:109-133, 135 >> 111-134 >> 125-136 >> 119-136 >> 117-135 - 2h after breakfast:142-159 >> 153-162 >> 152-161 >> 144-163 - before lunch 144 >> 110-120s >> n/c   - before dinner: 101-133  >> 98-119 >> 99-116 >> 102-119 >> 102-116 - 2h after dinner:  134-157, 166 >> 145-154 >> 145-152 >> 140-149 He has hypoglycemia awareness in the 60s. Highest: 287 >> ...   132 >> 161 >> 163  His wife is a Research scientist (life sciences).  He saw [name] (nutritionist).  -+ Mild CKD, last BUN/creatinine:  Lab Results  Component Value Date   BUN 19 11/09/2023   CREATININE 1.39 11/09/2023   No results found for: MICRALBCREAT On Entresto .  -+ HL:  last set of lipids: Lab Results  Component Value Date   CHOL 82 11/09/2023   HDL 57.40 11/09/2023   LDLCALC 13 11/09/2023   LDLDIRECT 18.0 06/13/2017   TRIG 59.0 11/09/2023   CHOLHDL 1 11/09/2023  He was in the Reveal Study x 4 years >> stopped. On Lipitor 20 and omega-3 fatty acids.  - last eye  exam was on 01/08/2024: + DR Per previous reports, he did not have macular edema, but he has cystoid macular edema OD -Dr. Elner.   He has a history of cataract surgery.   - no numbness and tingling in his feet.  Last foot exam  02/29/2024-Dr. Loreda. He had toe osteomyelitis and had to have it amputated in 01/2022.  He is now seeing Dr. Loreda. He has onychomycosis.  He is compliant with CPAP for OSA, also has A. Fib, IBD -ulcerative pancolitis, GERD, also, obesity. In 2019 she was also diagnosed with CHF  -EF of 25 to 30%.  His Lasix  was increased to twice a day(takes the second dose as needed). Cardiac cath >> no significant obstruction.  He was started on Entresto . He was admitted 3/20-24/2022 for hypoxemic respiratory failure in the setting of CHF after not taking his Lasix  for a few weeks due to increased urination.  At that time, he was unresponsive, had A. fib with RVR at that time and a glucose level at 260.  He had to be intubated.  He recovered well afterwards.  He has a history of very low vitamin B12, improved: Lab Results  Component Value Date   VITAMINB12 758 05/02/2023   VITAMINB12 734 03/03/2022   VITAMINB12 1,256 (H) 09/02/2020   VITAMINB12 103 (L) 02/24/2020   VITAMINB12 267 05/01/2012   VITAMINB12 201 (L) 11/23/2010   He was started on B12 injections - per PCP.  Hypothyroidism:  Pt is on levothyroxine  50 mcg daily, taken: - in am - fasting - at least 30 min from b'fast - no Ca, Fe, MVI, PPIs - not on Biotin  Latest TSH was reviewed and this was normal: Lab Results  Component Value Date   TSH 1.29 11/09/2023   Pt denies: - feeling nodules in neck - hoarseness - dysphagia - choking  ROS: + See HPI  I reviewed pt's medications, allergies, PMH, social hx, family hx, and changes were documented in the history of present illness. Otherwise, unchanged from my initial visit note.  Past Medical History:  Diagnosis Date   B12 deficiency    per patient previously taking shots   C. difficile colitis 04/17/2019   C. difficile diarrhea    CAD (coronary artery disease), native coronary artery 04/12/2007   Chronic atrial fibrillation (HCC)    initial diagnoses 2012   Chronic combined systolic and diastolic CHF (congestive heart failure) (HCC) 04/15/2019   CKD (chronic kidney disease), stage III (HCC) 04/15/2019   CORONARY ARTERY DISEASE 04/12/2007   2 stents last in 2006.    Cystoid macular edema of right eye 01/16/2020   Cystoid macular edema of right eye 01/16/2020    DIABETES MELLITUS, TYPE II 04/16/2007   Diverticulosis of colon (without mention of hemorrhage)    GERD (gastroesophageal reflux disease) 08/09/2011   Hyperlipidemia 04/16/2007   10/24 reveal study end. Atorvastatin  20mg    HYPERTENSION 04/12/2007   HYPOTHYROIDISM 04/12/2007   Ischemic cardiomyopathy    Morbid obesity (HCC) 08/09/2011   MYOCARDIAL INFARCTION, HX OF 04/12/2007   Obstructive sleep apnea 04/12/2007   02/2011 - AHI 96/h CPAP 13, Lg FF      Stroke (HCC)    Type II diabetes mellitus with peripheral circulatory disorder (HCC) 04/16/2007   Dr. Trixie manages. Uses fructosamine.     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 Mesalamine .    VITAMIN B12 DEFICIENCY 11/29/2010   Past Surgical History:  Procedure  Laterality Date   CARDIAC CATHETERIZATION  10/2002   STENT. 2 stents Dr. Karyle   CARDIAC CATHETERIZATION N/A 10/28/2015   Procedure: Right/Left Heart Cath and Coronary Angiography;  Surgeon: Ozell Fell, MD;  Location: [facility] INVASIVE CV LAB;  Service: Cardiovascular;  Laterality: N/A;   CATARACT EXTRACTION     2021 bilateral eyes    COLONOSCOPY  2012   Dr. Jakie; findings consistent with universal UC s/p random biopsies, no active bleeding, polyps, masses.  Pathology with mild chronic active colitis.  Repeat in 5 years.   CORONARY STENT PLACEMENT  2008   LAD    I & D EXTREMITY Right 01/27/2022   Procedure: RIGHT SECOND METARTSOPHALANGEAL JOINT DISARTICULATION, IRRIGATION AND DEBRIDEMENT OF ABSCESS RIGHT FOOT;  Surgeon: Elsa Lonni SAUNDERS, MD;  Location: [facility] OR;  Service: Orthopedics;  Laterality: Right;   RIGHT/LEFT HEART CATH AND CORONARY ANGIOGRAPHY N/A 06/01/2018   Procedure: RIGHT/LEFT HEART CATH AND CORONARY ANGIOGRAPHY;  Surgeon: Fell Ozell, MD;  Location: [facility] INV[facility] CV LAB;  Service: Cardiovascular;  Laterality: N/A;   TONSILLECTOMY     Social History   Socioeconomic History    Marital status: Married    Spouse name: Not on file   Number of children: 1   Years of education: Not on file   Highest education level: Not on file  Occupational History   Occupation: RETIRED    Employer: RETIRED  Tobacco Use   Smoking status: Former    Current packs/day: 0.00    Average packs/day: 1 pack/day for 5.0 years (5.0 ttl pk-yrs)    Types: Cigarettes    Start date: 04/13/1961    Quit date: 04/13/1966    Years since quitting: 58.1   Smokeless tobacco: Never  Vaping Use   Vaping status: Never Used  Substance and Sexual Activity   Alcohol use: No    Alcohol/week: 0.0 standard drinks of alcohol    Comment: no more beer   Drug use: No   Sexual activity: Not Currently  Other Topics Concern   Not on file  Social History Narrative   Cardiorehab 3 days a week. 45 minutes to an hour-stationary bike.    GRANDDAUGHTER (MS. 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       Married for 37 years in 2015, married previously for 14 years. Daughter with first wife and 3 grandkids.    Lives alone with wife. Get to see grandkids a lot. New grandchild in middle of 27      Hobbies-yardwork, previously liked to hunt and fish, does some target shooting      Land for 6 yrs   Social Drivers of Corporate investment banker Strain: Low Risk  (11/23/2023)   Overall Financial Resource Strain (CARDIA)    Difficulty of Paying Living Expenses: Not hard at all  Food Insecurity: No Food Insecurity (11/23/2023)   Hunger Vital Sign    Worried About Running Out of Food in the Last Year: Never true    Ran Out of Food in the Last Year: Never true  Transportation Needs: No Transportation Needs (11/23/2023)   PRAPARE - Administrator, Civil Service (Medical): No    Lack of Transportation (Non-Medical): No  Physical Activity: Inactive (11/23/2023)   Exercise Vital Sign    Days of  Exercise per Week: 0 days    Minutes  of Exercise per Session: 0 min  Stress: No Stress Concern Present (11/23/2023)   [employer] of Occupational Health - Occupational Stress Questionnaire    Feeling of Stress : Not at all  Social Connections: Moderately Integrated (11/23/2023)   Social Connection and Isolation Panel    Frequency of Communication with Friends and Family: Three times a week    Frequency of Social Gatherings with Friends and Family: More than three times a week    Attends Religious Services: More than 4 times per year    Active Member of Clubs or Organizations: No    Attends Banker Meetings: Never    Marital Status: Married  Catering manager Violence: Not At Risk (11/23/2023)   Humiliation, Afraid, Rape, and Kick questionnaire    Fear of Current or Ex-Partner: No    Emotionally Abused: No    Physically Abused: No    Sexually Abused: No   Current Outpatient Medications on File Prior to Visit  Medication Sig Dispense Refill   apixaban  (ELIQUIS ) 5 MG TABS tablet Take 1 tablet (5 mg total) by mouth 2 (two) times daily. 180 tablet 1   atorvastatin  (LIPITOR) 20 MG tablet TAKE ONE TABLET (20MG  TOTAL) BY MOUTH DAILY 30 tablet 1   carvedilol  (COREG ) 25 MG tablet TAKE ONE TABLET BY MOUTH TWICE A DAY 180 tablet 3   clopidogrel  (PLAVIX ) 75 MG tablet TAKE ONE TABLET (75MG  TOTAL) BY MOUTH DAILY 90 tablet 3   Coenzyme Q10 (COQ10 PO) Take 1 capsule by mouth daily with supper.     cyanocobalamin  1000 MCG tablet Take 1,000 mcg by mouth daily.     dicyclomine  (BENTYL ) 10 MG capsule TAKE ONE CAPSULE (10MG  TOTAL) BY MOUTH TWO TIMES DAILY AS NEEDED FOR DIARRHEA OR ABDOMINAL CRAMPING 180 capsule 3   fish oil-omega-3 fatty acids 1000 MG capsule Take 1 g by mouth daily with supper.     furosemide  (LASIX ) 40 MG tablet TAKE ONE TABLET (40MG  TOTAL) BY MOUTH TWO TIMES DAILY 60 tablet 7   glucosamine-chondroitin 500-400 MG tablet Take 1 tablet by mouth daily.     glucose blood test  strip Use as instructed to test 4 time daily 400 each 3   isosorbide  mononitrate (IMDUR ) 60 MG 24 hr tablet TAKE ONE (1) TABLET BY MOUTH EVERY DAY 90 tablet 3   JANUVIA  100 MG tablet TAKE ONE (1) TABLET BY MOUTH EVERY DAY 90 tablet 3   JARDIANCE  10 MG TABS tablet TAKE ONE (1) TABLET BY MOUTH EVERY DAY 90 tablet 3   levothyroxine  (SYNTHROID ) 50 MCG tablet Take 1 tablet (50 mcg total) by mouth daily before breakfast. 90 tablet 3   mesalamine  (APRISO ) 0.375 g 24 hr capsule TAKE 4 CAPSULES (1.5 GRAMS TOTAL) BY MOUTH DAILY 120 capsule 3   metFORMIN  (GLUCOPHAGE ) 500 MG tablet TAKE ONE TABLET (500MG  TOTAL) BY MOUTH TWO TIMES DAILY WITH A MEAL 180 tablet 3   saccharomyces boulardii (FLORASTOR) 250 MG capsule Take 1 capsule (250 mg total) by mouth 2 (two) times daily.     sacubitril -valsartan  (ENTRESTO ) 49-51 MG Take 1 tablet by mouth 2 (two) times daily. 60 tablet 11   spironolactone  (ALDACTONE ) 25 MG tablet TAKE ONE (1) TABLET BY MOUTH EVERY DAY 90 tablet 3   No current facility-administered medications on file prior to visit.   Allergies  Allergen Reactions   Clindamycin/Lincomycin     Developed C. difficile colitis 1 month after use   Family History  Problem Relation Age of Onset  Heart attack Mother        mid 75s   Heart disease Father        H/O CAD, CABG, VALVE SURGERY   Hypertension Brother    Obesity Brother    Stomach cancer Maternal Aunt    Stomach cancer Paternal Grandmother        ? colon    Colon cancer Neg Hx    PE: BP 124/80 (BP Location: Left Arm, Patient Position: Sitting, Cuff Size: Normal)   Pulse (!) 109   Ht 5' 9 (1.753 m)   Wt 223 lb (101.2 kg)   SpO2 98%   BMI 32.93 kg/m   Wt Readings from Last 10 Encounters:  05/28/24 223 lb (101.2 kg)  11/28/23 224 lb 12.8 oz (102 kg)  11/23/23 229 lb (103.9 kg)  11/13/23 229 lb 12.8 oz (104.2 kg)  11/09/23 237 lb 3.2 oz (107.6 kg)  08/16/23 222 lb 6.4 oz (100.9 kg)  08/16/23 221 lb 6.4 oz (100.4 kg)  07/27/23 225  lb 12.8 oz (102.4 kg)  06/08/23 225 lb 3.2 oz (102.2 kg)  06/07/23 225 lb 12.8 oz (102.4 kg)   Constitutional: overweight, in NAD Eyes: EOMI, no exophthalmos ENT: no thyromegaly, no cervical lymphadenopathy Cardiovascular: RRR, No MRG, + B periankle edema L>R Respiratory: CTA B Musculoskeletal: no deformities Skin: + stasis dermatitis rash BLE, + violaceous darker semicircular rash above the medial aspect of his left foot (NLD?) Neurological: + tremor with outstretched hands  ASSESSMENT: 1. DM2, non-insulin -dependent, previously uncontrolled, with complications - CAD, s/p MI 10/2006 - s/p stent - sees Dr. Wonda  2. HL  3.  Hypothyroidism  PLAN:  1. Patient with longstanding, previously uncontrolled, type 2 diabetes, with significant improvement in control in the last few years.  Latest HbA1c was stable, at goal, at 6.5% at last visit.  He is on metformin , DPP 4 inhibitor, and SGLT2 inhibitor.  Sugars remained at goal at last visit.  We discussed about possibly starting a GLP-1 receptor agonist, not only for the diabetic benefits but mostly for cardiovascular benefits.  We have to be careful with GLP-1 receptor agonist for him as he has a history of ulcerative pancolitis.  I did advise him to think about it and let me know if he wanted to start.  At today's visit he tells me that he decided not to try this class of medications. -At today's visit, sugars are mostly at goal.  He feels well and has no complaints.  No need to change his regimen.  His prescriptions are also all up-to-date. - I advised him to Patient Instructions  Please continue:  - Metformin  500 mg 2x a day - Januvia  100 mg before breakfast - Jardiance  10 mg before breakfast   Please return in 6 months with your sugar log.   - we checked his HbA1c: 6.4% (lower) - advised to check sugars at different times of the day - 1x a day, rotating check times -given more logs - advised for yearly eye exams >> he is UTD - will  check an ACR today - return to clinic in 6 months  2. HL - Latest lipid panel was reviewed from 10/2023: All fractions at goal: Lab Results  Component Value Date   CHOL 82 11/09/2023   HDL 57.40 11/09/2023   LDLCALC 13 11/09/2023   LDLDIRECT 18.0 06/13/2017   TRIG 59.0 11/09/2023   CHOLHDL 1 11/09/2023  -He continues on Lipitor 20 mg daily and omega-3 fatty acids, without  side effects  3.  Hypothyroidism - latest thyroid  labs reviewed with pt. >> normal: Lab Results  Component Value Date   TSH 1.29 11/09/2023  - he continues on LT4 50 mcg daily - pt feels good on this dose. - we discussed about taking the thyroid  hormone every day, with water, >30 minutes before breakfast, separated by >4 hours from acid reflux medications, calcium , iron, multivitamins. Pt. is taking it correctly.  Lela Fendt, MD PhD [facility]

## 2024-05-28 NOTE — Patient Instructions (Addendum)
Please continue:  - Metformin 500 mg 2x a day - Januvia 100 mg before breakfast - Jardiance 10 mg before breakfast   Please return in 6 months with your sugar log.  

## 2024-05-28 NOTE — Addendum Note (Signed)
 Addended by: OCTAVIO DIETRICH CROME on: 05/28/2024 03:08 PM   Modules accepted: Orders

## 2024-05-29 ENCOUNTER — Ambulatory Visit: Payer: Self-pay | Admitting: Internal Medicine

## 2024-05-29 LAB — MICROALBUMIN / CREATININE URINE RATIO
Creatinine, Urine: 26 mg/dL (ref 20–320)
Microalb, Ur: <0.2 mg/dL

## 2024-05-31 ENCOUNTER — Other Ambulatory Visit: Payer: Self-pay | Admitting: Family Medicine

## 2024-05-31 DIAGNOSIS — E785 Hyperlipidemia, unspecified: Secondary | ICD-10-CM

## 2024-06-05 ENCOUNTER — Encounter: Payer: Self-pay | Admitting: Podiatry

## 2024-06-05 ENCOUNTER — Ambulatory Visit (INDEPENDENT_AMBULATORY_CARE_PROVIDER_SITE_OTHER): Admitting: Podiatry

## 2024-06-05 DIAGNOSIS — S98131S Complete traumatic amputation of one right lesser toe, sequela: Secondary | ICD-10-CM | POA: Diagnosis not present

## 2024-06-05 DIAGNOSIS — B351 Tinea unguium: Secondary | ICD-10-CM | POA: Diagnosis not present

## 2024-06-05 DIAGNOSIS — E1151 Type 2 diabetes mellitus with diabetic peripheral angiopathy without gangrene: Secondary | ICD-10-CM | POA: Diagnosis not present

## 2024-06-05 DIAGNOSIS — M79674 Pain in right toe(s): Secondary | ICD-10-CM | POA: Diagnosis not present

## 2024-06-05 DIAGNOSIS — M79675 Pain in left toe(s): Secondary | ICD-10-CM

## 2024-06-05 NOTE — Progress Notes (Signed)
 This patient returns to my office for at risk foot care.  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  Patient has amputation second toe right foot.  This patient is unable to cut nails himself since the patient cannot reach his nails.These nails are painful walking and wearing shoes.  This patient presents for at risk foot care today.  General Appearance  Alert, conversant and in no acute stress.  Vascular  Dorsalis pedis and posterior tibial  pulses are weakly  palpable  bilaterally.  Capillary return is within normal limits  bilaterally. Temperature is within normal limits  bilaterally..  Venous stasis  B/L.  Neurologic  Senn-Weinstein monofilament wire test within normal limits  bilaterally. Muscle power within normal limits bilaterally.  Nails Thick disfigured discolored nails with subungual debris  from hallux to fifth toes  left and 1,3-5 right.. No evidence of bacterial infection or drainage bilaterally.  Orthopedic  No limitations of motion  feet .  No crepitus or effusions noted.  No bony pathology or digital deformities noted. Amputation second toe right foot.  Skin  normotropic skin with no porokeratosis noted bilaterally.  No signs of infections or ulcers noted.  Dry peeling skin  B/L. Marland Kitchen  Onychomycosis  Pain in right toes  Pain in left toes  Consent was obtained for treatment procedures.   Mechanical debridement of nails 1-5  bilaterally performed with a nail nipper.  Filed with dremel without incident.    Return office visit   3 months                   Told patient to return for periodic foot care and evaluation due to potential at risk complications.   Helane Gunther DPM

## 2024-06-07 ENCOUNTER — Ambulatory Visit: Admitting: Family Medicine

## 2024-06-07 ENCOUNTER — Encounter: Payer: Self-pay | Admitting: Family Medicine

## 2024-06-07 VITALS — BP 102/64 | HR 56 | Temp 99.1°F | Ht 69.0 in | Wt 220.0 lb

## 2024-06-07 DIAGNOSIS — I4821 Permanent atrial fibrillation: Secondary | ICD-10-CM | POA: Diagnosis not present

## 2024-06-07 DIAGNOSIS — E538 Deficiency of other specified B group vitamins: Secondary | ICD-10-CM | POA: Diagnosis not present

## 2024-06-07 DIAGNOSIS — E785 Hyperlipidemia, unspecified: Secondary | ICD-10-CM | POA: Diagnosis not present

## 2024-06-07 DIAGNOSIS — E1151 Type 2 diabetes mellitus with diabetic peripheral angiopathy without gangrene: Secondary | ICD-10-CM | POA: Diagnosis not present

## 2024-06-07 DIAGNOSIS — E039 Hypothyroidism, unspecified: Secondary | ICD-10-CM | POA: Diagnosis not present

## 2024-06-07 DIAGNOSIS — I10 Essential (primary) hypertension: Secondary | ICD-10-CM | POA: Diagnosis not present

## 2024-06-07 DIAGNOSIS — Z7984 Long term (current) use of oral hypoglycemic drugs: Secondary | ICD-10-CM

## 2024-06-07 LAB — CBC WITH DIFFERENTIAL/PLATELET
Basophils Absolute: 0 K/uL (ref 0.0–0.1)
Basophils Relative: 0.4 % (ref 0.0–3.0)
Eosinophils Absolute: 0.1 K/uL (ref 0.0–0.7)
Eosinophils Relative: 1 % (ref 0.0–5.0)
HCT: 48.6 % (ref 39.0–52.0)
Hemoglobin: 16.3 g/dL (ref 13.0–17.0)
Lymphocytes Relative: 14.4 % (ref 12.0–46.0)
Lymphs Abs: 1.1 K/uL (ref 0.7–4.0)
MCHC: 33.5 g/dL (ref 30.0–36.0)
MCV: 91.3 fl (ref 78.0–100.0)
Monocytes Absolute: 0.6 K/uL (ref 0.1–1.0)
Monocytes Relative: 7.5 % (ref 3.0–12.0)
Neutro Abs: 5.7 K/uL (ref 1.4–7.7)
Neutrophils Relative %: 76.7 % (ref 43.0–77.0)
Platelets: 132 K/uL — ABNORMAL LOW (ref 150.0–400.0)
RBC: 5.33 Mil/uL (ref 4.22–5.81)
RDW: 14.1 % (ref 11.5–15.5)
WBC: 7.4 K/uL (ref 4.0–10.5)

## 2024-06-07 LAB — COMPREHENSIVE METABOLIC PANEL WITH GFR
ALT: 14 U/L (ref 0–53)
AST: 19 U/L (ref 0–37)
Albumin: 4.2 g/dL (ref 3.5–5.2)
Alkaline Phosphatase: 63 U/L (ref 39–117)
BUN: 28 mg/dL — ABNORMAL HIGH (ref 6–23)
CO2: 32 meq/L (ref 19–32)
Calcium: 9.2 mg/dL (ref 8.4–10.5)
Chloride: 99 meq/L (ref 96–112)
Creatinine, Ser: 1.52 mg/dL — ABNORMAL HIGH (ref 0.40–1.50)
GFR: 43.66 mL/min — ABNORMAL LOW (ref >60.00)
Glucose, Bld: 103 mg/dL — ABNORMAL HIGH (ref 70–99)
Potassium: 4.4 meq/L (ref 3.5–5.1)
Sodium: 141 meq/L (ref 135–145)
Total Bilirubin: 0.9 mg/dL (ref 0.2–1.2)
Total Protein: 6.7 g/dL (ref 6.0–8.3)

## 2024-06-07 LAB — VITAMIN B12: Vitamin B-12: 642 pg/mL (ref 211–911)

## 2024-06-07 LAB — TSH: TSH: 0.86 u[IU]/mL (ref 0.35–5.50)

## 2024-06-07 NOTE — Progress Notes (Signed)
 Phone [phone] In person visit   Subjective:   [name] is a 78 y.o. year old very pleasant male patient who presents for/with See problem oriented charting Chief Complaint  Patient presents with   Follow-up    Past Medical History-  Patient Active Problem List   Diagnosis Date Noted   History of transient ischemic attack (TIA) 10/24/2015    Priority: High   CHF (congestive heart failure), NYHA class II (HCC) 05/09/2014    Priority: High   Ulcerative pancolitis without complication (HCC) 08/09/2011    Priority: High   Atrial fibrillation (HCC) 12/08/2010    Priority: High   Type II diabetes mellitus with peripheral circulatory disorder (HCC) 04/16/2007    Priority: High   CAD (coronary artery disease), native coronary artery 04/12/2007    Priority: High   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   Hives of unknown origin 07/27/2023   History of infection of intestine due to Clostridioides difficile 07/27/2023   Posterior vitreous detachment of both eyes 06/09/2022   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 180 tablet 1   atorvastatin  (LIPITOR) 20 MG tablet TAKE ONE TABLET (20MG  TOTAL) BY MOUTH DAILY 30 tablet 1   carvedilol  (COREG ) 25 MG tablet TAKE ONE TABLET BY MOUTH TWICE A DAY 180 tablet 3   clopidogrel  (PLAVIX ) 75 MG tablet TAKE ONE TABLET (75MG  TOTAL) BY MOUTH DAILY 90 tablet 3   Coenzyme Q10 (COQ10 PO) Take 1 capsule by mouth daily with supper.     cyanocobalamin  1000 MCG tablet Take 1,000 mcg by mouth daily.     dicyclomine  (BENTYL ) 10 MG capsule TAKE ONE CAPSULE (10MG  TOTAL) BY MOUTH TWO TIMES DAILY AS NEEDED FOR DIARRHEA OR ABDOMINAL CRAMPING 180 capsule 3   fish oil-omega-3 fatty acids 1000 MG capsule Take 1 g by mouth daily with supper.     furosemide  (LASIX ) 40 MG tablet TAKE ONE TABLET (40MG  TOTAL) BY MOUTH TWO TIMES DAILY 60 tablet 7   glucosamine-chondroitin 500-400 MG tablet Take 1 tablet by mouth daily.     glucose blood test strip Use as instructed to test 4 time daily 400 each 3   isosorbide  mononitrate (IMDUR ) 60 MG 24 hr tablet TAKE ONE (1) TABLET BY MOUTH EVERY DAY 90 tablet 3   JANUVIA  100 MG tablet TAKE ONE (  1) TABLET BY MOUTH EVERY DAY 90 tablet 3   JARDIANCE  10 MG TABS tablet TAKE ONE (1) TABLET BY MOUTH EVERY DAY 90 tablet 3   levothyroxine  (SYNTHROID ) 50 MCG tablet Take 1 tablet (50 mcg total) by mouth daily before breakfast. 90 tablet 3   mesalamine  (APRISO ) 0.375 g 24 hr capsule TAKE 4 CAPSULES (1.5 GRAMS TOTAL) BY MOUTH DAILY 120 capsule 3   metFORMIN  (GLUCOPHAGE ) 500 MG tablet TAKE ONE TABLET (500MG  TOTAL) BY MOUTH TWO TIMES DAILY WITH A MEAL 180 tablet 3   saccharomyces boulardii (FLORASTOR) 250 MG capsule Take 1 capsule (250 mg total) by mouth 2 (two) times daily.     sacubitril -valsartan  (ENTRESTO ) 49-51 MG Take 1 tablet by mouth 2 (two)  times daily. 60 tablet 11   spironolactone  (ALDACTONE ) 25 MG tablet TAKE ONE (1) TABLET BY MOUTH EVERY DAY 90 tablet 3   No current facility-administered medications for this visit.     Objective:  BP 102/64   Pulse (!) 56   Temp 99.1 F (37.3 C) (Temporal)   Ht 5' 9 (1.753 m)   Wt 220 lb (99.8 kg)   SpO2 99%   BMI 32.49 kg/m  Gen: NAD, resting comfortably CV: slightly bradycardic no murmurs rubs or gallops Lungs: CTAB no crackles, wheeze, rhonchi Ext: trace edema Skin: warm, dry    Assessment and Plan   #Congestive heart failure with ejection fraction of 25% during hospitalization 2019 later improved to 50-55% march 2022 S: Medication: Lasix  40 mg twice daily, Aldactone  25 mg, Entresto  49-51mg  mg twice daily, Jardiance  10 mg, carvedilol  25 mg BID -down 17 lbs- has started doing more vegetables and cutting down on bread - no increased edeam A/P: CHF euvolemic continue current medications    #%CAD/atrial fibrillation/hyperlipidemia. Follows with Dr. Wonda of cardiology S: Compliant with atorvastatin  20mg -LDL has been at goal under 70  I have prescribed his Plavix -with his CAD history-he is compliant with this .  He is also on Eliquis  5 mg twice daily  for anticoagulation due to atrial fibrillation-reports compliance .  Patient reports cardiology wants him on both medications.  Patient is  on carvedilol  for rate control -also on imdur  60mg  - no recent chest pain or shortness of breath- mowing yard, taking out trash no issues  Lab Results  Component Value Date   CHOL 82 11/09/2023   HDL 57.40 11/09/2023   LDLCALC 13 11/09/2023   LDLDIRECT 18.0 06/13/2017   TRIG 59.0 11/09/2023   CHOLHDL 1 11/09/2023   A/P: coronary artery disease asymptomatic continue current medications  A fib appropriately anticoagulated and rate controlled- continue current medicine Lipids at goal- continue current medications    #hypertension S: Compliant with Coreg  25 mg twice a day, Imdur  60 mg,  Entresto , spironolactone  25 mg, Lasix  40 mg twice daily  A/P:  well controlled - a lot of his medications are really for  CHF and not primarily blood pressure - well controlled though continue current medications   #% Hypothyroidism S: Compliant with Synthroid  50 mcg-most recently filled by Dr. Trixie Lab Results  Component Value Date   TSH 1.29 11/09/2023  A/P: hopefully stable- update tsh today. Continue current meds for now   - I am willing to prescribe Synthroid  if needed   % Ulcerative colitis S: Followed by GI- compliant with mesalamine  with provider in [place]. No abdominal cramping or blood in stool A/P:  well controlled - continue current medications and follow up with gastroenterology    #Diabetes-followed by  Dr. Trixie S:medication: on metformin  500mg  BID and jardiance  10mg  and januvia  100mg   Lab Results  Component Value Date   HGBA1C 6.4 (A) 05/28/2024   HGBA1C 6.5 11/09/2023   HGBA1C 6.5 (A) 06/08/2023  A/P: well controlled continue current medications    # Low b12- on sublingual b12- update levels- prior had been low- still taking Lab Results  Component Value Date   VITAMINB12 758 05/02/2023    Recommended follow up: Return in about 6 months (around 12/07/2024) for followup or sooner if needed.Schedule b4 you leave. Future Appointments  Date Time Provider Department Center  08/28/2024  4:00 PM Loreda Hacker, DPM [facility]  11/28/2024  8:00 AM [facility] ANNUAL WELLNESS VISIT 1 [facility]  11/28/2024  3:00 PM Trixie File, MD [facility] None    Lab/Order associations:   ICD-10-CM   1. Essential hypertension  I10 Comprehensive metabolic panel with GFR    CBC with Differential/Platelet    2. Hyperlipidemia, unspecified hyperlipidemia type  E78.5 Comprehensive metabolic panel with GFR    CBC with Differential/Platelet    3. Hypothyroidism, unspecified type  E03.9 TSH    4. Type II diabetes mellitus with peripheral circulatory  disorder (HCC)  E11.51     5. Permanent atrial fibrillation (HCC)  I48.21     6. B12 deficiency  E53.8 Vitamin B12      No orders of the defined types were placed in this encounter.   Return precautions advised.  Garnette Lukes, MD

## 2024-06-07 NOTE — Patient Instructions (Addendum)
 Please stop by lab before you go If you have mychart- we will send your results within 3 business days of us  receiving them.  If you do not have mychart- we will call you about results within 5 business days of us  receiving them.  *please also note that you will see labs on mychart as soon as they post. I will later go in and write notes on them- will say notes from Dr. Katrinka Cheng job increasing the vegetables/improving diet- keep up the great work!   Recommended follow up: Return in about 6 months (around 12/07/2024) for followup or sooner if needed.Schedule b4 you leave.

## 2024-06-08 ENCOUNTER — Ambulatory Visit: Payer: Self-pay | Admitting: Family Medicine

## 2024-06-14 DIAGNOSIS — C44319 Basal cell carcinoma of skin of other parts of face: Secondary | ICD-10-CM | POA: Diagnosis not present

## 2024-06-14 DIAGNOSIS — B351 Tinea unguium: Secondary | ICD-10-CM | POA: Diagnosis not present

## 2024-07-03 DIAGNOSIS — H43813 Vitreous degeneration, bilateral: Secondary | ICD-10-CM | POA: Diagnosis not present

## 2024-07-03 DIAGNOSIS — E113412 Type 2 diabetes mellitus with severe nonproliferative diabetic retinopathy with macular edema, left eye: Secondary | ICD-10-CM | POA: Diagnosis not present

## 2024-07-03 DIAGNOSIS — E113311 Type 2 diabetes mellitus with moderate nonproliferative diabetic retinopathy with macular edema, right eye: Secondary | ICD-10-CM | POA: Diagnosis not present

## 2024-07-03 DIAGNOSIS — G4733 Obstructive sleep apnea (adult) (pediatric): Secondary | ICD-10-CM | POA: Diagnosis not present

## 2024-07-03 LAB — HM DIABETES EYE EXAM

## 2024-07-09 ENCOUNTER — Telehealth: Payer: Self-pay | Admitting: Family Medicine

## 2024-07-09 NOTE — Telephone Encounter (Unsigned)
 Copied from CRM #[id number]. Topic: Clinical - Refused Triage >> Jul 09, 2024  4:32 PM Drema MATSU wrote: Patient/caller voiced complaints of swelling in both ankles. Declined transfer to triage.

## 2024-07-09 NOTE — Telephone Encounter (Signed)
 Please see message and advise.   Copied from CRM #[id number]. Topic: Clinical - Refused Triage >> Jul 09, 2024  4:38 PM Drema MATSU wrote: Patient/caller voiced complaints of swelling in both ankles. Declined transfer to triage.

## 2024-07-09 NOTE — Telephone Encounter (Signed)
 Copied from CRM #[id number]. Topic: Clinical - Refused Triage >> Jul 09, 2024  4:38 PM Drema MATSU wrote: Patient/caller voiced complaints of swelling in both ankles. Declined transfer to triage.

## 2024-07-09 NOTE — Telephone Encounter (Signed)
 How has his weight been? Is it trending up at home   Is he still taking  Lasix  40 mg twice daily, Aldactone  25 mg daily, Entresto  49-51mg  mg twice daily, Jardiance  10 mg, carvedilol  25 mg BID . Any missed doses?

## 2024-07-10 NOTE — Telephone Encounter (Signed)
 Patient scheduled to see Dr. Katrinka 07/11/2024. Pt requested to discuss everything while in office.

## 2024-07-11 ENCOUNTER — Encounter: Payer: Self-pay | Admitting: Family Medicine

## 2024-07-11 ENCOUNTER — Ambulatory Visit: Admitting: Family Medicine

## 2024-07-11 VITALS — BP 120/78 | HR 60 | Temp 97.5°F | Ht 69.0 in | Wt 219.0 lb

## 2024-07-11 DIAGNOSIS — L03115 Cellulitis of right lower limb: Secondary | ICD-10-CM | POA: Diagnosis not present

## 2024-07-11 DIAGNOSIS — I5042 Chronic combined systolic (congestive) and diastolic (congestive) heart failure: Secondary | ICD-10-CM

## 2024-07-11 DIAGNOSIS — I10 Essential (primary) hypertension: Secondary | ICD-10-CM

## 2024-07-11 MED ORDER — CEPHALEXIN 500 MG PO CAPS: 500.0000 mg | ORAL_CAPSULE | Freq: Three times a day (TID) | ORAL | 0 refills | Status: AC

## 2024-07-11 NOTE — Patient Instructions (Addendum)
 concern for cellulitis/skin infection- trial antibiotic cephalexin  3 times  a day for 7 days- follow up if symptoms worsen or fail to improve. Definitely let me know if fever or feeling worse  Watch out for that salt - it can bite you!   Recommended follow up: Return for as needed for new, worsening, persistent symptoms.

## 2024-07-11 NOTE — Progress Notes (Signed)
 Phone [phone] In person visit   Subjective:   [name] is a 78 y.o. year old very pleasant male patient who presents for/with See problem oriented charting Chief Complaint  Patient presents with   Leg Swelling    Right ankle swelling x2 weeks; red; not warm to the touch;    Past Medical History-  Patient Active Problem List   Diagnosis Date Noted   History of transient ischemic attack (TIA) 10/24/2015    Priority: High   CHF (congestive heart failure), NYHA class II (HCC) 05/09/2014    Priority: High   Ulcerative pancolitis without complication (HCC) 08/09/2011    Priority: High   Atrial fibrillation (HCC) 12/08/2010    Priority: High   Type II diabetes mellitus with peripheral circulatory disorder (HCC) 04/16/2007    Priority: High   CAD (coronary artery disease), native coronary artery 04/12/2007    Priority: High   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   Hives of unknown origin 07/27/2023   History of infection of intestine due to Clostridioides difficile 07/27/2023   Posterior vitreous detachment of both eyes 06/09/2022   Traumatic  amputation of toe 04/13/2022   Posterior capsular opacification, left 06/30/2021    Medications- reviewed and updated Current Outpatient Medications  Medication Sig Dispense Refill   apixaban  (ELIQUIS ) 5 MG TABS tablet Take 1 tablet (5 mg total) by mouth 2 (two) times daily. 180 tablet 1   atorvastatin  (LIPITOR) 20 MG tablet TAKE ONE TABLET (20MG  TOTAL) BY MOUTH DAILY 30 tablet 1   carvedilol  (COREG ) 25 MG tablet TAKE ONE TABLET BY MOUTH TWICE A DAY 180 tablet 3   cephALEXin  (KEFLEX ) 500 MG capsule Take 1 capsule (500 mg total) by mouth 3 (three) times daily for 7 days. 21 capsule 0   clopidogrel  (PLAVIX ) 75 MG tablet TAKE ONE TABLET (75MG  TOTAL) BY MOUTH DAILY 90 tablet 3   Coenzyme Q10 (COQ10 PO) Take 1 capsule by mouth daily with supper.     cyanocobalamin  1000 MCG tablet Take 1,000 mcg by mouth daily.     dicyclomine  (BENTYL ) 10 MG capsule TAKE ONE CAPSULE (10MG  TOTAL) BY MOUTH TWO TIMES DAILY AS NEEDED FOR DIARRHEA OR ABDOMINAL CRAMPING 180 capsule 3   fish oil-omega-3 fatty acids 1000 MG capsule Take 1 g by mouth daily with supper.     furosemide  (LASIX ) 40 MG tablet TAKE ONE TABLET (40MG  TOTAL) BY MOUTH TWO TIMES DAILY 60 tablet 7   glucosamine-chondroitin 500-400 MG tablet Take 1 tablet by mouth daily.     glucose blood test strip Use  as instructed to test 4 time daily 400 each 3   isosorbide  mononitrate (IMDUR ) 60 MG 24 hr tablet TAKE ONE (1) TABLET BY MOUTH EVERY DAY 90 tablet 3   JANUVIA  100 MG tablet TAKE ONE (1) TABLET BY MOUTH EVERY DAY 90 tablet 3   JARDIANCE  10 MG TABS tablet TAKE ONE (1) TABLET BY MOUTH EVERY DAY 90 tablet 3   levothyroxine  (SYNTHROID ) 50 MCG tablet Take 1 tablet (50 mcg total) by mouth daily before breakfast. 90 tablet 3   mesalamine  (APRISO ) 0.375 g 24 hr capsule TAKE 4 CAPSULES (1.5 GRAMS TOTAL) BY MOUTH DAILY 120 capsule 3   metFORMIN  (GLUCOPHAGE ) 500 MG tablet TAKE ONE TABLET (500MG  TOTAL) BY MOUTH TWO TIMES DAILY WITH A MEAL 180 tablet 3    saccharomyces boulardii (FLORASTOR) 250 MG capsule Take 1 capsule (250 mg total) by mouth 2 (two) times daily.     sacubitril -valsartan  (ENTRESTO ) 49-51 MG Take 1 tablet by mouth 2 (two) times daily. 60 tablet 11   spironolactone  (ALDACTONE ) 25 MG tablet TAKE ONE (1) TABLET BY MOUTH EVERY DAY 90 tablet 3   No current facility-administered medications for this visit.     Objective:  BP 120/78 (BP Location: Left Arm, Patient Position: Sitting, Cuff Size: Normal)   Pulse 60   Temp (!) 97.5 F (36.4 C) (Temporal)   Ht 5' 9 (1.753 m)   Wt 219 lb (99.3 kg)   SpO2 97%   BMI 32.34 kg/m  Gen: NAD, resting comfortably CV: RRR no murmurs rubs or gallops Lungs: CTAB no crackles, wheeze, rhonchi Ext: no edema on left, trace on the right-erythema, mild warmth, mild tenderness on right lower extremity-extends up most on posterior calf but also noticed almost circumferentially on the lower portion of the leg Skin: warm, dry Neuro: grossly normal, moves all extremities     Assessment and Plan   # right ankle swelling/ redness/tenderness S: Patient has noted swelling in his right ankle for the last 2 weeks- he tried to ignore it but wore compression other than today- despite this seems to be progressing.  Area is red but not warm to touch per his report but on my exam is warm compared to the opposite leg.  Some tenderness with palpation. he denies fever.   Has a cream starting with an Aveeno daily moisturizing cream from dermatology that's been helpful that dermatology recommended A/P: Right leg erythema/warmth/tenderness/swelling with concern for cellulitis/skin infection- trial antibiotic cephalexin  3 times  a day for 7 days- follow up if symptoms worsen or fail to improve -No calf pain or tenderness and with erythema and warmth suspect cellulitis strongly over something like DVT especially with long-term Eliquis  prescription that he is on for A-fib  #Congestive heart failure with ejection  fraction of 25% during hospitalization 2019 later improved to 50-55% march 2022 S: Medication: Lasix  40 mg twice daily, Aldactone  25 mg, Entresto  49-51 twice daily, Jardiance  10 mg, carvedilol  25 mg BID  A/P: Initial report when he called and was bilateral ankle swelling and concern was for CHF exacerbation but weight is actually trending down and swelling is unilateral associated with erythema and warmth concerning for cellulitis  #hypertension S: Compliant with Coreg  25 mg twice a day, Imdur  60 mg, spironolactone  25 mg, Lasix  40 mg twice daily  A/P: Blood pressure well-controlled-continue current medication  Recommended follow up: Return for as needed for new, worsening, persistent symptoms. Future Appointments  Date Time Provider Department Center  08/28/2024  4:00 PM Loreda Hacker,  DPM [facility]  11/28/2024  8:00 AM [facility] ANNUAL WELLNESS VISIT 1 [facility] [place]  11/28/2024  3:00 PM Trixie File, MD [facility] None  12/11/2024 11:00 AM Katrinka Garnette KIDD, MD [facility]    Lab/Order associations:   ICD-10-CM   1. Cellulitis of right lower leg  L03.115     2. Essential hypertension  I10     3. Chronic combined systolic and diastolic CHF (congestive heart failure) (HCC)  I50.42       Meds ordered this encounter  Medications   cephALEXin  (KEFLEX ) 500 MG capsule    Sig: Take 1 capsule (500 mg total) by mouth 3 (three) times daily for 7 days.    Dispense:  21 capsule    Refill:  0    Return precautions advised.  Garnette Katrinka, MD

## 2024-07-18 ENCOUNTER — Other Ambulatory Visit: Payer: Self-pay | Admitting: Cardiovascular Disease

## 2024-07-29 DIAGNOSIS — C44319 Basal cell carcinoma of skin of other parts of face: Secondary | ICD-10-CM | POA: Diagnosis not present

## 2024-07-31 DIAGNOSIS — Z23 Encounter for immunization: Secondary | ICD-10-CM | POA: Diagnosis not present

## 2024-08-02 ENCOUNTER — Other Ambulatory Visit: Payer: Self-pay | Admitting: Internal Medicine

## 2024-08-02 ENCOUNTER — Other Ambulatory Visit: Payer: Self-pay | Admitting: Family Medicine

## 2024-08-02 DIAGNOSIS — E785 Hyperlipidemia, unspecified: Secondary | ICD-10-CM

## 2024-08-05 DIAGNOSIS — H43813 Vitreous degeneration, bilateral: Secondary | ICD-10-CM | POA: Diagnosis not present

## 2024-08-05 DIAGNOSIS — E113311 Type 2 diabetes mellitus with moderate nonproliferative diabetic retinopathy with macular edema, right eye: Secondary | ICD-10-CM | POA: Diagnosis not present

## 2024-08-05 DIAGNOSIS — E113412 Type 2 diabetes mellitus with severe nonproliferative diabetic retinopathy with macular edema, left eye: Secondary | ICD-10-CM | POA: Diagnosis not present

## 2024-08-05 LAB — OPHTHALMOLOGY REPORT-SCANNED

## 2024-08-07 ENCOUNTER — Telehealth: Payer: Self-pay | Admitting: Gastroenterology

## 2024-08-07 NOTE — Telephone Encounter (Signed)
 Patient came in office and said he medicine prescribe Apriso  is going to charge him a few thousand dollars and was wondering if he could switch to Mesalamine  (a generic) medicine and see how much this would cost him instead.

## 2024-08-07 NOTE — Telephone Encounter (Signed)
 Spoke with pt and he stated that he was quoted that when setting up next years insurance. Contacted [facility] and they were unable to tell me anything until the first of the year. Told pt to keep us  updated.

## 2024-08-17 ENCOUNTER — Other Ambulatory Visit (HOSPITAL_COMMUNITY): Payer: Self-pay | Admitting: Internal Medicine

## 2024-08-28 ENCOUNTER — Ambulatory Visit: Admitting: Podiatry

## 2024-08-28 ENCOUNTER — Encounter: Payer: Self-pay | Admitting: Podiatry

## 2024-08-28 DIAGNOSIS — M79674 Pain in right toe(s): Secondary | ICD-10-CM | POA: Diagnosis not present

## 2024-08-28 DIAGNOSIS — E1151 Type 2 diabetes mellitus with diabetic peripheral angiopathy without gangrene: Secondary | ICD-10-CM | POA: Diagnosis not present

## 2024-08-28 DIAGNOSIS — S98131S Complete traumatic amputation of one right lesser toe, sequela: Secondary | ICD-10-CM

## 2024-08-28 DIAGNOSIS — M79675 Pain in left toe(s): Secondary | ICD-10-CM

## 2024-08-28 DIAGNOSIS — B351 Tinea unguium: Secondary | ICD-10-CM

## 2024-08-28 NOTE — Progress Notes (Signed)
 This patient returns to my office for at risk foot care.  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  Patient has amputation second toe right foot.  This patient is unable to cut nails himself since the patient cannot reach his nails.These nails are painful walking and wearing shoes.  This patient presents for at risk foot care today.  General Appearance  Alert, conversant and in no acute stress.  Vascular  Dorsalis pedis and posterior tibial  pulses are weakly  palpable  bilaterally.  Capillary return is within normal limits  bilaterally. Temperature is within normal limits  bilaterally..  Venous stasis  B/L.  Neurologic  Senn-Weinstein monofilament wire test within normal limits  bilaterally. Muscle power within normal limits bilaterally.  Nails Thick disfigured discolored nails with subungual debris  from hallux to fifth toes  left and 1,3-5 right.. No evidence of bacterial infection or drainage bilaterally.  Orthopedic  No limitations of motion  feet .  No crepitus or effusions noted.  No bony pathology or digital deformities noted. Amputation second toe right foot.  Skin  normotropic skin with no porokeratosis noted bilaterally.  No signs of infections or ulcers noted.  Dry peeling skin  B/L. Marland Kitchen  Onychomycosis  Pain in right toes  Pain in left toes  Consent was obtained for treatment procedures.   Mechanical debridement of nails 1-5  bilaterally performed with a nail nipper.  Filed with dremel without incident.    Return office visit   3 months                   Told patient to return for periodic foot care and evaluation due to potential at risk complications.   Helane Gunther DPM

## 2024-09-07 ENCOUNTER — Other Ambulatory Visit: Payer: Self-pay | Admitting: Gastroenterology

## 2024-09-07 DIAGNOSIS — K5100 Ulcerative (chronic) pancolitis without complications: Secondary | ICD-10-CM

## 2024-09-07 DIAGNOSIS — R197 Diarrhea, unspecified: Secondary | ICD-10-CM

## 2024-09-07 DIAGNOSIS — A0472 Enterocolitis due to Clostridium difficile, not specified as recurrent: Secondary | ICD-10-CM

## 2024-09-09 DIAGNOSIS — E113412 Type 2 diabetes mellitus with severe nonproliferative diabetic retinopathy with macular edema, left eye: Secondary | ICD-10-CM | POA: Diagnosis not present

## 2024-09-09 DIAGNOSIS — G4733 Obstructive sleep apnea (adult) (pediatric): Secondary | ICD-10-CM | POA: Diagnosis not present

## 2024-09-09 DIAGNOSIS — H43813 Vitreous degeneration, bilateral: Secondary | ICD-10-CM | POA: Diagnosis not present

## 2024-09-09 DIAGNOSIS — E113311 Type 2 diabetes mellitus with moderate nonproliferative diabetic retinopathy with macular edema, right eye: Secondary | ICD-10-CM | POA: Diagnosis not present

## 2024-09-09 LAB — OPHTHALMOLOGY REPORT-SCANNED

## 2024-09-19 DIAGNOSIS — L821 Other seborrheic keratosis: Secondary | ICD-10-CM | POA: Diagnosis not present

## 2024-09-19 DIAGNOSIS — C44319 Basal cell carcinoma of skin of other parts of face: Secondary | ICD-10-CM | POA: Diagnosis not present

## 2024-09-23 ENCOUNTER — Encounter: Payer: Self-pay | Admitting: Gastroenterology

## 2024-09-23 ENCOUNTER — Ambulatory Visit: Admitting: Gastroenterology

## 2024-09-23 VITALS — BP 95/56 | HR 84 | Temp 97.6°F | Ht 68.0 in | Wt 227.8 lb

## 2024-09-23 DIAGNOSIS — K5100 Ulcerative (chronic) pancolitis without complications: Secondary | ICD-10-CM

## 2024-09-23 MED ORDER — APRISO 0.375 G PO CP24: 1.5000 g | ORAL_CAPSULE | Freq: Every day | ORAL | 5 refills | Status: AC

## 2024-09-23 NOTE — Patient Instructions (Signed)
 We will work on your mesalamine  issue with upcoming insurance changes. Please call my CMA Tammy at [phone] if you are having troubles at the pharmacy with coverage of medication.   We will see you back in six months for a follow up.

## 2024-09-23 NOTE — Progress Notes (Signed)
 GI Office Note    Referring Provider: Katrinka Garnette KIDD, MD Primary Care Physician:  Katrinka Garnette KIDD, MD  Primary Gastroenterologist: Ozell Hollingshead, MD   Chief Complaint   Chief Complaint  Patient presents with   Ulcerative Colitis    History of Present Illness   [name] is a 78 y.o. male presenting today for one year follow up of UC.   Discussed the use of AI scribe software for clinical note transcription with the patient, who gave verbal consent to proceed.  History of Present Illness GRACYN [name] is a 78 year old male with ulcerative colitis who presents for medication management and follow-up. Last seen 08/2023.   H/o ulcerative pancolitis. He had Cdiff in 2020. Last colonoscopy 2012 with Dr. Jakie with no active bleeding, polyps, masses, findings consistent with universal UC status pos biopsies, stated colitis was in remission on aminosalicylate's. Pathology with mild chronic active colitis. Recommended repeat colonoscopy in 5 years.    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 States his cardiologist supports his decision not to have another. He also has h/o Afib going into [place] and required cardioversion. He has history of intermittent thrombocytopenia but previously declined u/s.   He takes mesalamine  (Apriso ) four pills in the morning and has had no blood in his stools, cramps, or other UC symptoms. He has about two bowel movements per day with consistency varying by diet and avoids foods such as chocolate that trigger discomfort or loose stools.  He is worried that insurance changes starting in January may stop coverage of Apriso  in 2026 and require a switch to generic mesalamine . He is apprehensive about changing from the current effective regimen. It appears generic Apriso  is on his new formulary.        Prior Data     Results  06/07/2024: BUN 28, Cre 1.52 11/09/2023: BUN 19, Cre  1.39     Medications   Current Outpatient Medications  Medication Sig Dispense Refill   apixaban  (ELIQUIS ) 5 MG TABS tablet Take 1 tablet (5 mg total) by mouth 2 (two) times daily. 180 tablet 1   atorvastatin  (LIPITOR) 20 MG tablet TAKE ONE TABLET (20MG  TOTAL) BY MOUTH DAILY 30 tablet 1   carvedilol  (COREG ) 25 MG tablet TAKE ONE TABLET BY MOUTH TWICE A DAY 180 tablet 3   clopidogrel  (PLAVIX ) 75 MG tablet TAKE ONE TABLET (75MG  TOTAL) BY MOUTH DAILY 90 tablet 3   Coenzyme Q10 (COQ10 PO) Take 1 capsule by mouth daily with supper.     cyanocobalamin  1000 MCG tablet Take 1,000 mcg by mouth daily.     dicyclomine  (BENTYL ) 10 MG capsule TAKE ONE CAPSULE (10MG  TOTAL) BY MOUTH TWO TIMES DAILY AS NEEDED FOR DIARRHEA OR ABDOMINAL CRAMPING 180 capsule 3   fish oil-omega-3 fatty acids 1000 MG capsule Take 1 g by mouth daily with supper.     fluconazole (DIFLUCAN) 200 MG tablet Take 200 mg by mouth once.     furosemide  (LASIX ) 40 MG tablet TAKE ONE TABLET (40MG  TOTAL) BY MOUTH TWO TIMES DAILY 60 tablet 7   glucosamine-chondroitin 500-400 MG tablet Take 1 tablet by mouth daily.     glucose blood test strip Use as instructed to test 4 time daily 400 each 3   isosorbide  mononitrate (IMDUR ) 60 MG 24 hr tablet TAKE ONE (1) TABLET BY MOUTH EVERY DAY 90 tablet 3   JANUVIA  100 MG tablet TAKE ONE (1)  TABLET BY MOUTH EVERY DAY 90 tablet 3   JARDIANCE  10 MG TABS tablet TAKE ONE (1) TABLET BY MOUTH EVERY DAY 90 tablet 3   mesalamine  (APRISO ) 0.375 g 24 hr capsule Take 4 capsules (1.5 g total) by mouth daily. 120 capsule 5   metFORMIN  (GLUCOPHAGE ) 500 MG tablet TAKE ONE TABLET (500MG  TOTAL) BY MOUTH TWO TIMES DAILY WITH A MEAL 180 tablet 3   saccharomyces boulardii (FLORASTOR) 250 MG capsule Take 1 capsule (250 mg total) by mouth 2 (two) times daily.     sacubitril -valsartan  (ENTRESTO ) 49-51 MG TAKE ONE TABLET BY MOUTH TWICE A DAY 60 tablet 3   spironolactone  (ALDACTONE ) 25 MG tablet TAKE ONE (1) TABLET BY MOUTH  EVERY DAY 90 tablet 3   SYNTHROID  50 MCG tablet TAKE ONE TABLET ( TOTAL) BY MOUTH DAILY BEFORE BREAKFAST 90 tablet 3   No current facility-administered medications for this visit.    Allergies   Allergies as of 09/23/2024 - Review Complete 09/23/2024  Allergen Reaction Noted   Clindamycin/lincomycin  04/26/2019       Review of Systems   General: Negative for anorexia, weight loss, fever, chills, fatigue, weakness. ENT: Negative for hoarseness, difficulty swallowing , nasal congestion. CV: Negative for chest pain, angina, palpitations, dyspnea on exertion, peripheral edema.  Respiratory: Negative for dyspnea at rest, dyspnea on exertion, cough, sputum, wheezing.  GI: See history of present illness. GU:  Negative for dysuria, hematuria, urinary incontinence, urinary frequency, nocturnal urination.  Endo: Negative for unusual weight change.     Physical Exam   BP (!) 95/56   Pulse 84   Temp 97.6 F (36.4 C) (Oral)   Ht 5' 8 (1.727 m)   Wt 227 lb 12.8 oz (103.3 kg)   SpO2 100%   BMI 34.64 kg/m    General: Well-nourished, well-developed in no acute distress.  Eyes: No icterus. Mouth: Oropharyngeal mucosa moist and pink   Lungs: Clear to auscultation bilaterally.  Heart: Regular rate and rhythm, no murmurs rubs or gallops.  Abdomen: Bowel sounds are normal, nontender, nondistended, no hepatosplenomegaly or masses,  no abdominal bruits or hernia , no rebound or guarding.  Rectal: not performed Extremities: No lower extremity edema. No clubbing or deformities. Neuro: Alert and oriented x 4   Skin: Warm and dry, no jaundice.   Psych: Alert and cooperative, normal mood and affect.  Labs   See above  Imaging Studies   No results found.  Assessment/Plan:    Assessment & Plan Ulcerative pancolitis Well-controlled with Apriso . No recent symptoms. Declines colonoscopy, given age and other comorbidities, this is appropriate. He is asymptomatic from GI standpoint.   - Continue Apriso  1.5g daily. We will send in rx for generic Apriso . If any issues with coverage in January he will let us  know.  - Follow up next labs, after PCP appt in March. If change in renal function, we will need to address UC treatment. - return ov in six months.        Sonny RAMAN. Ezzard, MHS, PA-C [facility]

## 2024-09-25 ENCOUNTER — Other Ambulatory Visit (HOSPITAL_COMMUNITY): Payer: Self-pay | Admitting: Internal Medicine

## 2024-09-27 ENCOUNTER — Other Ambulatory Visit: Payer: Self-pay | Admitting: Family Medicine

## 2024-09-27 DIAGNOSIS — E785 Hyperlipidemia, unspecified: Secondary | ICD-10-CM

## 2024-11-27 ENCOUNTER — Ambulatory Visit: Admitting: Podiatry

## 2024-11-28 ENCOUNTER — Ambulatory Visit: Admitting: Internal Medicine

## 2024-11-28 ENCOUNTER — Ambulatory Visit: Payer: Medicare Other

## 2024-12-04 ENCOUNTER — Ambulatory Visit

## 2024-12-11 ENCOUNTER — Ambulatory Visit: Admitting: Family Medicine
